# Patient Record
Sex: Female | Born: 1950 | Race: White | Hispanic: No | Marital: Single | Smoking: Former smoker
Health system: Southern US, Community
[De-identification: ages and names within clinical notes are randomized; demographics above are authoritative.]

## PROBLEM LIST (undated history)

## (undated) DIAGNOSIS — E538 Deficiency of other specified B group vitamins: Secondary | ICD-10-CM

## (undated) DIAGNOSIS — K760 Fatty (change of) liver, not elsewhere classified: Secondary | ICD-10-CM

## (undated) DIAGNOSIS — N289 Disorder of kidney and ureter, unspecified: Secondary | ICD-10-CM

## (undated) DIAGNOSIS — K0889 Other specified disorders of teeth and supporting structures: Secondary | ICD-10-CM

## (undated) DIAGNOSIS — C8199 Hodgkin lymphoma, unspecified, extranodal and solid organ sites: Secondary | ICD-10-CM

## (undated) DIAGNOSIS — M2550 Pain in unspecified joint: Secondary | ICD-10-CM

## (undated) DIAGNOSIS — M797 Fibromyalgia: Secondary | ICD-10-CM

## (undated) DIAGNOSIS — M549 Dorsalgia, unspecified: Secondary | ICD-10-CM

## (undated) DIAGNOSIS — M109 Gout, unspecified: Secondary | ICD-10-CM

## (undated) DIAGNOSIS — M1990 Unspecified osteoarthritis, unspecified site: Secondary | ICD-10-CM

## (undated) DIAGNOSIS — E8881 Metabolic syndrome: Secondary | ICD-10-CM

## (undated) DIAGNOSIS — E039 Hypothyroidism, unspecified: Secondary | ICD-10-CM

## (undated) DIAGNOSIS — J439 Emphysema, unspecified: Secondary | ICD-10-CM

## (undated) DIAGNOSIS — Z87898 Personal history of other specified conditions: Secondary | ICD-10-CM

## (undated) DIAGNOSIS — F32A Depression, unspecified: Secondary | ICD-10-CM

## (undated) DIAGNOSIS — G4733 Obstructive sleep apnea (adult) (pediatric): Secondary | ICD-10-CM

## (undated) DIAGNOSIS — K589 Irritable bowel syndrome without diarrhea: Secondary | ICD-10-CM

## (undated) DIAGNOSIS — J45909 Unspecified asthma, uncomplicated: Secondary | ICD-10-CM

## (undated) DIAGNOSIS — R1310 Dysphagia, unspecified: Secondary | ICD-10-CM

## (undated) DIAGNOSIS — R0602 Shortness of breath: Secondary | ICD-10-CM

## (undated) DIAGNOSIS — F1990 Other psychoactive substance use, unspecified, uncomplicated: Secondary | ICD-10-CM

## (undated) DIAGNOSIS — K829 Disease of gallbladder, unspecified: Secondary | ICD-10-CM

## (undated) DIAGNOSIS — K219 Gastro-esophageal reflux disease without esophagitis: Secondary | ICD-10-CM

## (undated) DIAGNOSIS — I872 Venous insufficiency (chronic) (peripheral): Secondary | ICD-10-CM

## (undated) DIAGNOSIS — R079 Chest pain, unspecified: Secondary | ICD-10-CM

## (undated) DIAGNOSIS — M7989 Other specified soft tissue disorders: Secondary | ICD-10-CM

## (undated) DIAGNOSIS — N979 Female infertility, unspecified: Secondary | ICD-10-CM

## (undated) DIAGNOSIS — J449 Chronic obstructive pulmonary disease, unspecified: Secondary | ICD-10-CM

## (undated) DIAGNOSIS — F419 Anxiety disorder, unspecified: Secondary | ICD-10-CM

## (undated) DIAGNOSIS — F341 Dysthymic disorder: Secondary | ICD-10-CM

## (undated) HISTORY — DX: Unspecified osteoarthritis, unspecified site: M19.90

## (undated) HISTORY — DX: Other specified disorders of teeth and supporting structures: K08.89

## (undated) HISTORY — DX: Female infertility, unspecified: N97.9

## (undated) HISTORY — DX: Disorder of kidney and ureter, unspecified: N28.9

## (undated) HISTORY — DX: Anxiety disorder, unspecified: F41.9

## (undated) HISTORY — PX: APPENDECTOMY: SHX54

## (undated) HISTORY — DX: Deficiency of other specified B group vitamins: E53.8

## (undated) HISTORY — DX: Chronic obstructive pulmonary disease, unspecified: J44.9

## (undated) HISTORY — PX: SPLENECTOMY: SUR1306

## (undated) HISTORY — DX: Gout, unspecified: M10.9

## (undated) HISTORY — DX: Venous insufficiency (chronic) (peripheral): I87.2

## (undated) HISTORY — DX: Metabolic syndrome: E88.810

## (undated) HISTORY — DX: Chest pain, unspecified: R07.9

## (undated) HISTORY — DX: Disease of gallbladder, unspecified: K82.9

## (undated) HISTORY — DX: Fatty (change of) liver, not elsewhere classified: K76.0

## (undated) HISTORY — DX: Hypothyroidism, unspecified: E03.9

## (undated) HISTORY — DX: Other specified soft tissue disorders: M79.89

## (undated) HISTORY — DX: Personal history of other specified conditions: Z87.898

## (undated) HISTORY — DX: Obstructive sleep apnea (adult) (pediatric): G47.33

## (undated) HISTORY — DX: Emphysema, unspecified: J43.9

## (undated) HISTORY — DX: Dorsalgia, unspecified: M54.9

## (undated) HISTORY — DX: Shortness of breath: R06.02

## (undated) HISTORY — DX: Dysthymic disorder: F34.1

## (undated) HISTORY — DX: Pain in unspecified joint: M25.50

## (undated) HISTORY — DX: Irritable bowel syndrome, unspecified: K58.9

## (undated) HISTORY — DX: Metabolic syndrome: E88.81

## (undated) HISTORY — DX: Gastro-esophageal reflux disease without esophagitis: K21.9

## (undated) HISTORY — DX: Morbid (severe) obesity due to excess calories: E66.01

## (undated) HISTORY — DX: Unspecified asthma, uncomplicated: J45.909

## (undated) HISTORY — DX: Dysphagia, unspecified: R13.10

## (undated) HISTORY — DX: Depression, unspecified: F32.A

## (undated) HISTORY — DX: Fibromyalgia: M79.7

## (undated) HISTORY — DX: Other psychoactive substance use, unspecified, uncomplicated: F19.90

---

## 1980-03-02 HISTORY — PX: CHOLECYSTECTOMY: SHX55

## 2003-04-17 ENCOUNTER — Encounter (HOSPITAL_COMMUNITY): Admission: RE | Admit: 2003-04-17 | Discharge: 2003-07-16 | Payer: Self-pay | Admitting: Pulmonary Disease

## 2003-05-29 ENCOUNTER — Ambulatory Visit (HOSPITAL_BASED_OUTPATIENT_CLINIC_OR_DEPARTMENT_OTHER): Admission: RE | Admit: 2003-05-29 | Discharge: 2003-05-29 | Payer: Self-pay | Admitting: Pulmonary Disease

## 2003-07-27 ENCOUNTER — Ambulatory Visit (HOSPITAL_COMMUNITY): Admission: RE | Admit: 2003-07-27 | Discharge: 2003-07-27 | Payer: Self-pay | Admitting: Pulmonary Disease

## 2003-08-03 ENCOUNTER — Encounter: Payer: Self-pay | Admitting: Pulmonary Disease

## 2004-02-20 ENCOUNTER — Ambulatory Visit: Payer: Self-pay | Admitting: Pulmonary Disease

## 2004-04-21 ENCOUNTER — Ambulatory Visit: Payer: Self-pay | Admitting: Pulmonary Disease

## 2004-11-09 ENCOUNTER — Encounter: Admission: RE | Admit: 2004-11-09 | Discharge: 2004-11-09 | Payer: Self-pay | Admitting: Rheumatology

## 2004-12-03 ENCOUNTER — Ambulatory Visit: Payer: Self-pay | Admitting: Pulmonary Disease

## 2005-01-02 ENCOUNTER — Ambulatory Visit: Payer: Self-pay | Admitting: Internal Medicine

## 2005-02-20 ENCOUNTER — Ambulatory Visit: Payer: Self-pay | Admitting: Internal Medicine

## 2005-03-02 HISTORY — PX: BILATERAL SALPINGOOPHORECTOMY: SHX1223

## 2005-03-02 HISTORY — PX: LAPAROSCOPIC HYSTERECTOMY: SHX1926

## 2005-03-26 ENCOUNTER — Encounter: Admission: RE | Admit: 2005-03-26 | Discharge: 2005-06-24 | Payer: Self-pay | Admitting: Pulmonary Disease

## 2005-04-01 ENCOUNTER — Ambulatory Visit: Payer: Self-pay | Admitting: Pulmonary Disease

## 2005-04-09 ENCOUNTER — Ambulatory Visit: Payer: Self-pay | Admitting: Pulmonary Disease

## 2005-05-14 ENCOUNTER — Ambulatory Visit: Payer: Self-pay | Admitting: Pulmonary Disease

## 2005-05-25 ENCOUNTER — Ambulatory Visit: Payer: Self-pay | Admitting: Pulmonary Disease

## 2005-06-23 ENCOUNTER — Emergency Department (HOSPITAL_COMMUNITY): Admission: EM | Admit: 2005-06-23 | Discharge: 2005-06-23 | Payer: Self-pay | Admitting: Emergency Medicine

## 2005-06-26 ENCOUNTER — Ambulatory Visit: Payer: Self-pay | Admitting: Pulmonary Disease

## 2005-07-06 ENCOUNTER — Encounter: Payer: Self-pay | Admitting: Pulmonary Disease

## 2005-07-16 ENCOUNTER — Ambulatory Visit (HOSPITAL_COMMUNITY): Admission: RE | Admit: 2005-07-16 | Discharge: 2005-07-16 | Payer: Self-pay | Admitting: Cardiovascular Disease

## 2005-07-22 ENCOUNTER — Ambulatory Visit: Payer: Self-pay | Admitting: Pulmonary Disease

## 2005-07-22 ENCOUNTER — Inpatient Hospital Stay (HOSPITAL_COMMUNITY): Admission: AD | Admit: 2005-07-22 | Discharge: 2005-07-23 | Payer: Self-pay | Admitting: Psychiatry

## 2005-07-22 ENCOUNTER — Emergency Department (HOSPITAL_COMMUNITY): Admission: EM | Admit: 2005-07-22 | Discharge: 2005-07-22 | Payer: Self-pay | Admitting: Emergency Medicine

## 2005-07-23 ENCOUNTER — Ambulatory Visit: Payer: Self-pay | Admitting: Psychiatry

## 2005-07-31 ENCOUNTER — Ambulatory Visit (HOSPITAL_COMMUNITY): Payer: Self-pay | Admitting: Psychiatry

## 2005-08-19 ENCOUNTER — Ambulatory Visit: Payer: Self-pay | Admitting: Pulmonary Disease

## 2005-09-07 ENCOUNTER — Ambulatory Visit: Payer: Self-pay | Admitting: Pulmonary Disease

## 2005-09-09 ENCOUNTER — Ambulatory Visit (HOSPITAL_COMMUNITY): Payer: Self-pay | Admitting: Psychiatry

## 2005-09-17 ENCOUNTER — Ambulatory Visit: Payer: Self-pay | Admitting: Pulmonary Disease

## 2005-09-28 ENCOUNTER — Ambulatory Visit: Payer: Self-pay | Admitting: Pulmonary Disease

## 2005-10-07 ENCOUNTER — Ambulatory Visit: Payer: Self-pay | Admitting: Pulmonary Disease

## 2005-10-27 ENCOUNTER — Ambulatory Visit: Payer: Self-pay | Admitting: Pulmonary Disease

## 2005-11-16 ENCOUNTER — Ambulatory Visit: Payer: Self-pay | Admitting: Pulmonary Disease

## 2005-11-25 ENCOUNTER — Ambulatory Visit: Payer: Self-pay | Admitting: Pulmonary Disease

## 2005-11-30 ENCOUNTER — Ambulatory Visit: Payer: Self-pay | Admitting: Pulmonary Disease

## 2006-02-15 ENCOUNTER — Ambulatory Visit: Payer: Self-pay | Admitting: Pulmonary Disease

## 2006-03-08 ENCOUNTER — Ambulatory Visit: Payer: Self-pay | Admitting: Pulmonary Disease

## 2006-04-05 ENCOUNTER — Ambulatory Visit: Payer: Self-pay | Admitting: Pulmonary Disease

## 2006-04-19 ENCOUNTER — Ambulatory Visit: Payer: Self-pay | Admitting: Pulmonary Disease

## 2006-05-31 ENCOUNTER — Ambulatory Visit: Payer: Self-pay | Admitting: Pulmonary Disease

## 2006-07-09 ENCOUNTER — Ambulatory Visit: Payer: Self-pay | Admitting: Pulmonary Disease

## 2006-07-09 LAB — CONVERTED CEMR LAB
AST: 25 units/L (ref 0–37)
Albumin: 3.5 g/dL (ref 3.5–5.2)
Basophils Absolute: 0 10*3/uL (ref 0.0–0.1)
Basophils Relative: 0 % (ref 0.0–1.0)
Bilirubin, Direct: 0.2 mg/dL (ref 0.0–0.3)
CO2: 31 meq/L (ref 19–32)
GFR calc Af Amer: 66 mL/min
GFR calc non Af Amer: 54 mL/min
Glucose, Bld: 93 mg/dL (ref 70–99)
Hgb A1c MFr Bld: 7.1 % — ABNORMAL HIGH (ref 4.6–6.0)
MCHC: 33.4 g/dL (ref 30.0–36.0)
MCV: 92.4 fL (ref 78.0–100.0)
Monocytes Absolute: 0.7 10*3/uL (ref 0.2–0.7)
Potassium: 4.6 meq/L (ref 3.5–5.1)
RBC: 5.11 M/uL (ref 3.87–5.11)
RDW: 14.6 % (ref 11.5–14.6)
Sodium: 143 meq/L (ref 135–145)
Total Bilirubin: 0.8 mg/dL (ref 0.3–1.2)
Total Protein: 6.8 g/dL (ref 6.0–8.3)
WBC: 12.1 10*3/uL — ABNORMAL HIGH (ref 4.5–10.5)

## 2006-08-02 ENCOUNTER — Ambulatory Visit: Payer: Self-pay | Admitting: Pulmonary Disease

## 2006-10-06 ENCOUNTER — Ambulatory Visit: Payer: Self-pay | Admitting: Pulmonary Disease

## 2006-10-11 ENCOUNTER — Ambulatory Visit: Payer: Self-pay | Admitting: Pulmonary Disease

## 2006-11-15 ENCOUNTER — Ambulatory Visit: Payer: Self-pay | Admitting: Pulmonary Disease

## 2006-11-15 LAB — CONVERTED CEMR LAB
ALT: 34 units/L (ref 0–35)
AST: 34 units/L (ref 0–37)
Albumin: 3.5 g/dL (ref 3.5–5.2)
Alkaline Phosphatase: 133 units/L — ABNORMAL HIGH (ref 39–117)
BUN: 21 mg/dL (ref 6–23)
Basophils Relative: 0 % (ref 0.0–1.0)
Chloride: 109 meq/L (ref 96–112)
Cholesterol: 175 mg/dL (ref 0–200)
Eosinophils Absolute: 0.2 10*3/uL (ref 0.0–0.6)
Free T4: 0.9 ng/dL (ref 0.6–1.6)
GFR calc non Af Amer: 61 mL/min
LDL Cholesterol: 125 mg/dL — ABNORMAL HIGH (ref 0–99)
Lymphocytes Relative: 18.8 % (ref 12.0–46.0)
MCHC: 33.7 g/dL (ref 30.0–36.0)
MCV: 93.6 fL (ref 78.0–100.0)
Platelets: 367 10*3/uL (ref 150–400)
Potassium: 4.3 meq/L (ref 3.5–5.1)
Sodium: 147 meq/L — ABNORMAL HIGH (ref 135–145)
TSH: 0.56 microintl units/mL (ref 0.35–5.50)
Total Bilirubin: 0.8 mg/dL (ref 0.3–1.2)
Total Protein: 6.8 g/dL (ref 6.0–8.3)
Triglycerides: 133 mg/dL (ref 0–149)

## 2006-12-13 ENCOUNTER — Encounter: Admission: RE | Admit: 2006-12-13 | Discharge: 2006-12-13 | Payer: Self-pay | Admitting: Surgery

## 2006-12-13 ENCOUNTER — Ambulatory Visit: Payer: Self-pay | Admitting: Pulmonary Disease

## 2006-12-20 ENCOUNTER — Ambulatory Visit (HOSPITAL_COMMUNITY): Admission: RE | Admit: 2006-12-20 | Discharge: 2006-12-20 | Payer: Self-pay | Admitting: Surgery

## 2007-01-19 ENCOUNTER — Telehealth: Payer: Self-pay | Admitting: Pulmonary Disease

## 2007-02-09 ENCOUNTER — Telehealth: Payer: Self-pay | Admitting: Pulmonary Disease

## 2007-02-28 DIAGNOSIS — J449 Chronic obstructive pulmonary disease, unspecified: Secondary | ICD-10-CM | POA: Insufficient documentation

## 2007-02-28 DIAGNOSIS — G4733 Obstructive sleep apnea (adult) (pediatric): Secondary | ICD-10-CM

## 2007-02-28 DIAGNOSIS — K589 Irritable bowel syndrome without diarrhea: Secondary | ICD-10-CM | POA: Insufficient documentation

## 2007-02-28 DIAGNOSIS — K219 Gastro-esophageal reflux disease without esophagitis: Secondary | ICD-10-CM | POA: Insufficient documentation

## 2007-03-09 ENCOUNTER — Telehealth (INDEPENDENT_AMBULATORY_CARE_PROVIDER_SITE_OTHER): Payer: Self-pay | Admitting: *Deleted

## 2007-05-31 ENCOUNTER — Ambulatory Visit: Payer: Self-pay | Admitting: Pulmonary Disease

## 2007-05-31 LAB — CONVERTED CEMR LAB: A-1 Antitrypsin, Ser: 218 mg/dL — ABNORMAL HIGH (ref 83–200)

## 2007-06-08 LAB — CONVERTED CEMR LAB
BUN: 17 mg/dL (ref 6–23)
CO2: 35 meq/L — ABNORMAL HIGH (ref 19–32)
Creatinine, Ser: 1.5 mg/dL — ABNORMAL HIGH (ref 0.4–1.2)
GFR calc Af Amer: 46 mL/min
GFR calc non Af Amer: 38 mL/min
Glucose, Bld: 106 mg/dL — ABNORMAL HIGH (ref 70–99)

## 2007-06-15 ENCOUNTER — Ambulatory Visit: Payer: Self-pay | Admitting: Pulmonary Disease

## 2007-06-15 ENCOUNTER — Encounter: Payer: Self-pay | Admitting: Adult Health

## 2007-06-15 DIAGNOSIS — R079 Chest pain, unspecified: Secondary | ICD-10-CM

## 2007-06-16 ENCOUNTER — Ambulatory Visit: Payer: Self-pay | Admitting: Cardiology

## 2007-06-20 ENCOUNTER — Ambulatory Visit: Payer: Self-pay | Admitting: Cardiology

## 2007-06-20 ENCOUNTER — Telehealth: Payer: Self-pay | Admitting: Pulmonary Disease

## 2007-06-21 ENCOUNTER — Encounter: Payer: Self-pay | Admitting: Pulmonary Disease

## 2007-06-21 ENCOUNTER — Ambulatory Visit: Payer: Self-pay

## 2007-06-22 ENCOUNTER — Ambulatory Visit: Payer: Self-pay

## 2007-06-24 ENCOUNTER — Ambulatory Visit: Payer: Self-pay | Admitting: Pulmonary Disease

## 2007-06-24 DIAGNOSIS — I872 Venous insufficiency (chronic) (peripheral): Secondary | ICD-10-CM

## 2007-06-24 DIAGNOSIS — E039 Hypothyroidism, unspecified: Secondary | ICD-10-CM

## 2007-06-24 DIAGNOSIS — E8881 Metabolic syndrome: Secondary | ICD-10-CM

## 2007-06-24 DIAGNOSIS — R51 Headache: Secondary | ICD-10-CM | POA: Insufficient documentation

## 2007-06-24 DIAGNOSIS — F341 Dysthymic disorder: Secondary | ICD-10-CM

## 2007-06-24 DIAGNOSIS — R519 Headache, unspecified: Secondary | ICD-10-CM | POA: Insufficient documentation

## 2007-06-24 DIAGNOSIS — M1990 Unspecified osteoarthritis, unspecified site: Secondary | ICD-10-CM

## 2007-06-24 DIAGNOSIS — Z8542 Personal history of malignant neoplasm of other parts of uterus: Secondary | ICD-10-CM | POA: Insufficient documentation

## 2007-07-07 ENCOUNTER — Encounter: Payer: Self-pay | Admitting: Pulmonary Disease

## 2007-08-02 ENCOUNTER — Telehealth (INDEPENDENT_AMBULATORY_CARE_PROVIDER_SITE_OTHER): Payer: Self-pay | Admitting: *Deleted

## 2007-08-02 ENCOUNTER — Ambulatory Visit: Payer: Self-pay | Admitting: Internal Medicine

## 2007-08-10 ENCOUNTER — Telehealth (INDEPENDENT_AMBULATORY_CARE_PROVIDER_SITE_OTHER): Payer: Self-pay | Admitting: *Deleted

## 2007-08-23 ENCOUNTER — Encounter: Admission: RE | Admit: 2007-08-23 | Discharge: 2007-11-21 | Payer: Self-pay | Admitting: Surgery

## 2007-08-31 HISTORY — PX: LAPAROSCOPIC GASTRIC BANDING: SHX1100

## 2007-09-06 ENCOUNTER — Inpatient Hospital Stay (HOSPITAL_COMMUNITY): Admission: AD | Admit: 2007-09-06 | Discharge: 2007-09-08 | Payer: Self-pay | Admitting: Surgery

## 2007-09-21 ENCOUNTER — Encounter: Payer: Self-pay | Admitting: Pulmonary Disease

## 2007-09-28 ENCOUNTER — Encounter: Payer: Self-pay | Admitting: Pulmonary Disease

## 2007-10-06 ENCOUNTER — Ambulatory Visit: Payer: Self-pay | Admitting: Pulmonary Disease

## 2007-10-06 ENCOUNTER — Telehealth (INDEPENDENT_AMBULATORY_CARE_PROVIDER_SITE_OTHER): Payer: Self-pay | Admitting: *Deleted

## 2007-10-14 LAB — CONVERTED CEMR LAB
Basophils Absolute: 0.2 10*3/uL — ABNORMAL HIGH (ref 0.0–0.1)
Basophils Relative: 2 % (ref 0.0–3.0)
CO2: 30 meq/L (ref 19–32)
Chloride: 102 meq/L (ref 96–112)
Eosinophils Relative: 2.5 % (ref 0.0–5.0)
GFR calc non Af Amer: 49 mL/min
Glucose, Bld: 96 mg/dL (ref 70–99)
HCT: 55.9 % — ABNORMAL HIGH (ref 36.0–46.0)
MCHC: 32.3 g/dL (ref 30.0–36.0)
Monocytes Absolute: 0.8 10*3/uL (ref 0.1–1.0)
Potassium: 4.5 meq/L (ref 3.5–5.1)
Pro B Natriuretic peptide (BNP): 116 pg/mL — ABNORMAL HIGH (ref 0.0–100.0)
Sodium: 141 meq/L (ref 135–145)

## 2007-11-01 ENCOUNTER — Encounter (INDEPENDENT_AMBULATORY_CARE_PROVIDER_SITE_OTHER): Payer: Self-pay | Admitting: *Deleted

## 2007-11-09 ENCOUNTER — Ambulatory Visit: Payer: Self-pay | Admitting: Internal Medicine

## 2007-12-19 ENCOUNTER — Encounter: Payer: Self-pay | Admitting: Pulmonary Disease

## 2007-12-26 ENCOUNTER — Ambulatory Visit: Payer: Self-pay | Admitting: Pulmonary Disease

## 2008-02-15 ENCOUNTER — Ambulatory Visit: Payer: Self-pay | Admitting: Pulmonary Disease

## 2008-02-22 ENCOUNTER — Telehealth: Payer: Self-pay | Admitting: Pulmonary Disease

## 2008-02-23 ENCOUNTER — Ambulatory Visit: Payer: Self-pay | Admitting: Pulmonary Disease

## 2008-03-08 ENCOUNTER — Ambulatory Visit: Payer: Self-pay | Admitting: Pulmonary Disease

## 2008-04-03 ENCOUNTER — Telehealth (INDEPENDENT_AMBULATORY_CARE_PROVIDER_SITE_OTHER): Payer: Self-pay | Admitting: *Deleted

## 2008-04-17 ENCOUNTER — Telehealth (INDEPENDENT_AMBULATORY_CARE_PROVIDER_SITE_OTHER): Payer: Self-pay | Admitting: *Deleted

## 2008-07-24 ENCOUNTER — Ambulatory Visit: Payer: Self-pay | Admitting: Pulmonary Disease

## 2008-07-29 LAB — CONVERTED CEMR LAB
ALT: 21 units/L (ref 0–35)
AST: 33 units/L (ref 0–37)
Albumin: 3.7 g/dL (ref 3.5–5.2)
Basophils Absolute: 0 10*3/uL (ref 0.0–0.1)
Bilirubin, Direct: 0.3 mg/dL (ref 0.0–0.3)
CO2: 29 meq/L (ref 19–32)
Chloride: 108 meq/L (ref 96–112)
Creatinine, Ser: 1.1 mg/dL (ref 0.4–1.2)
GFR calc non Af Amer: 53.88 mL/min (ref >60)
Hemoglobin: 16.7 g/dL — ABNORMAL HIGH (ref 12.0–15.0)
Lymphs Abs: 2.1 10*3/uL (ref 0.7–4.0)
MCV: 93.2 fL (ref 78.0–100.0)
Neutrophils Relative %: 75.4 % (ref 43.0–77.0)
Potassium: 3.2 meq/L — ABNORMAL LOW (ref 3.5–5.1)
RBC: 5.14 M/uL — ABNORMAL HIGH (ref 3.87–5.11)
Total Bilirubin: 0.8 mg/dL (ref 0.3–1.2)
Vit D, 25-Hydroxy: 16 ng/mL — ABNORMAL LOW (ref 30–89)

## 2008-09-12 ENCOUNTER — Ambulatory Visit: Payer: Self-pay | Admitting: Pulmonary Disease

## 2008-09-14 LAB — CONVERTED CEMR LAB
BUN: 21 mg/dL (ref 6–23)
Calcium: 9.3 mg/dL (ref 8.4–10.5)
Glucose, Bld: 112 mg/dL — ABNORMAL HIGH (ref 70–99)
Potassium: 3.9 meq/L (ref 3.5–5.1)
Sodium: 145 meq/L (ref 135–145)

## 2008-09-17 ENCOUNTER — Telehealth: Payer: Self-pay | Admitting: Adult Health

## 2008-09-21 ENCOUNTER — Telehealth (INDEPENDENT_AMBULATORY_CARE_PROVIDER_SITE_OTHER): Payer: Self-pay | Admitting: *Deleted

## 2008-10-10 ENCOUNTER — Ambulatory Visit: Payer: Self-pay | Admitting: Adult Health

## 2008-11-09 ENCOUNTER — Telehealth (INDEPENDENT_AMBULATORY_CARE_PROVIDER_SITE_OTHER): Payer: Self-pay | Admitting: *Deleted

## 2008-12-06 ENCOUNTER — Telehealth (INDEPENDENT_AMBULATORY_CARE_PROVIDER_SITE_OTHER): Payer: Self-pay | Admitting: *Deleted

## 2008-12-31 ENCOUNTER — Telehealth: Payer: Self-pay | Admitting: Internal Medicine

## 2009-01-21 ENCOUNTER — Ambulatory Visit: Payer: Self-pay | Admitting: Pulmonary Disease

## 2009-01-21 DIAGNOSIS — E559 Vitamin D deficiency, unspecified: Secondary | ICD-10-CM | POA: Insufficient documentation

## 2009-04-01 ENCOUNTER — Ambulatory Visit: Payer: Self-pay | Admitting: Pulmonary Disease

## 2009-04-10 ENCOUNTER — Telehealth (INDEPENDENT_AMBULATORY_CARE_PROVIDER_SITE_OTHER): Payer: Self-pay | Admitting: *Deleted

## 2009-04-11 ENCOUNTER — Ambulatory Visit: Payer: Self-pay | Admitting: Pulmonary Disease

## 2009-04-11 DIAGNOSIS — J45901 Unspecified asthma with (acute) exacerbation: Secondary | ICD-10-CM | POA: Insufficient documentation

## 2009-04-11 LAB — CONVERTED CEMR LAB
AST: 30 units/L (ref 0–37)
Alkaline Phosphatase: 89 units/L (ref 39–117)
BUN: 19 mg/dL (ref 6–23)
CO2: 33 meq/L — ABNORMAL HIGH (ref 19–32)
Chloride: 100 meq/L (ref 96–112)
Eosinophils Absolute: 0.3 10*3/uL (ref 0.0–0.7)
Glucose, Bld: 76 mg/dL (ref 70–99)
HCT: 46.8 % — ABNORMAL HIGH (ref 36.0–46.0)
Hemoglobin: 15.5 g/dL — ABNORMAL HIGH (ref 12.0–15.0)
MCV: 94.6 fL (ref 78.0–100.0)
Potassium: 4.5 meq/L (ref 3.5–5.1)
RBC: 4.95 M/uL (ref 3.87–5.11)
RDW: 13.2 % (ref 11.5–14.6)
TSH: 1.82 microintl units/mL (ref 0.35–5.50)

## 2009-04-30 ENCOUNTER — Ambulatory Visit: Payer: Self-pay | Admitting: Pulmonary Disease

## 2009-06-11 ENCOUNTER — Telehealth: Payer: Self-pay | Admitting: Pulmonary Disease

## 2009-07-22 ENCOUNTER — Telehealth: Payer: Self-pay | Admitting: Pulmonary Disease

## 2009-08-06 ENCOUNTER — Encounter: Payer: Self-pay | Admitting: Pulmonary Disease

## 2009-08-14 ENCOUNTER — Encounter: Payer: Self-pay | Admitting: Pulmonary Disease

## 2009-08-22 ENCOUNTER — Encounter: Payer: Self-pay | Admitting: Pulmonary Disease

## 2009-08-29 ENCOUNTER — Telehealth (INDEPENDENT_AMBULATORY_CARE_PROVIDER_SITE_OTHER): Payer: Self-pay | Admitting: *Deleted

## 2009-09-04 ENCOUNTER — Telehealth: Payer: Self-pay | Admitting: Pulmonary Disease

## 2009-12-02 ENCOUNTER — Telehealth (INDEPENDENT_AMBULATORY_CARE_PROVIDER_SITE_OTHER): Payer: Self-pay | Admitting: *Deleted

## 2009-12-04 ENCOUNTER — Ambulatory Visit: Payer: Self-pay | Admitting: Pulmonary Disease

## 2009-12-06 LAB — CONVERTED CEMR LAB
BUN: 15 mg/dL (ref 6–23)
CO2: 29 meq/L (ref 19–32)
Calcium: 9.3 mg/dL (ref 8.4–10.5)
Chloride: 108 meq/L (ref 96–112)
Creatinine, Ser: 1.2 mg/dL (ref 0.4–1.2)
GFR calc non Af Amer: 50.45 mL/min (ref >60)
Glucose, Bld: 127 mg/dL — ABNORMAL HIGH (ref 70–99)
Pro B Natriuretic peptide (BNP): 51.2 pg/mL (ref 0.0–100.0)

## 2010-01-16 ENCOUNTER — Telehealth (INDEPENDENT_AMBULATORY_CARE_PROVIDER_SITE_OTHER): Payer: Self-pay | Admitting: *Deleted

## 2010-03-07 ENCOUNTER — Ambulatory Visit
Admission: RE | Admit: 2010-03-07 | Discharge: 2010-03-07 | Payer: Self-pay | Source: Home / Self Care | Attending: Pulmonary Disease | Admitting: Pulmonary Disease

## 2010-03-24 ENCOUNTER — Encounter: Payer: Self-pay | Admitting: Surgery

## 2010-04-03 NOTE — Medication Information (Signed)
Summary: [employer]  [employer]   Imported By: Sherian Rein 08/19/2009 13:44:25  _____________________________________________________________________  External Attachment:    Type:   Image     Comment:   External Document

## 2010-04-03 NOTE — Progress Notes (Signed)
Summary: SAMPLES  Phone Note Call from Patient Call back at [phone]   Caller: Patient Call For: Rebecca [name] Summary of Call: NEED SAMPLES OF ADVAIR 50/500 AND SPIRIVA Initial call taken by: Rickard Patience,  June 11, 2009 9:27 AM  Follow-up for Phone Call        Samples left up front pt informed. Zackery Barefoot CMA  June 11, 2009 9:50 AM     Prescriptions: ADVAIR DISKUS 500-50 MCG/DOSE AEPB (FLUTICASONE-SALMETEROL) one inhalation two times a day...  #2 x 0   Entered by:   Zackery Barefoot CMA   Authorized by:   Michele Mcalpine MD   Signed by:   Zackery Barefoot CMA on 06/11/2009   Method used:   Samples Given   RxID:   [id number] SPIRIVA HANDIHALER 18 MCG  CAPS (TIOTROPIUM BROMIDE MONOHYDRATE) once daily  #2 x 0   Entered by:   Zackery Barefoot CMA   Authorized by:   Michele Mcalpine MD   Signed by:   Zackery Barefoot CMA on 06/11/2009   Method used:   Samples Given   RxID:   [id number]

## 2010-04-03 NOTE — Medication Information (Signed)
Summary: [employer]  [employer]   Imported By: Sherian Rein 08/22/2009 11:17:44  _____________________________________________________________________  External Attachment:    Type:   Image     Comment:   External Document

## 2010-04-03 NOTE — Progress Notes (Signed)
Summary: requests to be seen today or tomorrow-sn's pt  Phone Note Call from Patient Call back at [facility] Phone [phone]   Caller: Patient Call For: nadel Summary of Call: pt c/o SOB and chest congestion x 4 days. says that she doesn't think she has a fever, but is coughing up yellow phlegm. "just really hard to breathe". pt has completed the abx that tp gave her last week.  is using her inhalers- also has continued to take musinex. pt wants to be seen.  Initial call taken by: Tivis Ringer, CNA,  April 10, 2009 11:06 AM  Follow-up for Phone Call        Marliss Czar, please advise if we can use a blocked slot or if SN would like to try another abx first. Thanks.Reynaldo Minium CMA  April 10, 2009 11:20 AM    ok to add pt on thursday at 2:30pm.  thanks [address] [facility]  April 10, 2009 2:12 PM   Additional Follow-up for Phone Call Additional follow up Details #1::        called, spoke with pt.  Pt informed SN can see her tomorrow at 2:30-she is ok with this.  appt scheduled-pt aware.   Additional Follow-up by: Gweneth Dimitri RN,  April 10, 2009 2:20 PM

## 2010-04-03 NOTE — Assessment & Plan Note (Signed)
Summary: Acute NP office visit - dyspnea   CC:  dyspnea esp w/ exertion, chest congestion x1week - denies cough, wheezing, and f/c/s.  History of Present Illness: Patient is a 60 year old white female patient of Dr. [name] who has a known history of COPD, obstructive sleep apnea, and morbid obesity.now s/p lap band on 09/05/07.    ~  4/09:  seen w/ CP, SOB, reflux symptoms... "it's a bubble" and usually relieved by belching... she had a CTChest that showed no evid of pulm emboli, + severe centilobular emphysema, mild concurrent interstitial lung dis... labs showed Creat=1.5, A1AT level was OK... her Protonix was incr to Bid, and she was referred to [facility] for [facility] eval... she has seen DrWeintruab in the past w/ Cardiolites in 2005 & 2007 showing attenuation artifacts, no ischemia, and norm EF... DrWall repeated a Myoview w/ similiar findings- no scar or ischemia & EF=55%... I have rec a GI eval w/ EGD but she states she's had complete eval from DrMartin w/ Ba Swallow, etc... she wishes to change the Protonic to Nexium as she feels this works better for her... she is now awaiting her f/u eval w/ DrMartin for LapBand surgery...   ~  7/09:  had lap-band surgery for obesity by DrMartin- this required extensive enterolysis for adhesions... subsequently she has lost weight & encouraged by the resultls... she is seeing [name], psychologist specializing in eatin 10/06/07--now down 30 # since lap band surgery. Presents for 1 week of progressively worsening DOE  Stopped lasix since surgery - took self off.   November 09, 2007--wt down almost 50# . Presents for 1 day of increased DOE and ankle edema. Has not been  wearing o2 consistent. Does not wear at night sometimes.  --increased lasix  x 3 days.   December 26, 2007 --wt down 60# . Complains 2 weeks of cough, congestion, and DOE. No edema.  . Not wearing oxygen today. Lost job recently, currently thinking about applying for disability.   February 23, 2008  --presents for increased dyspnea. not wearing O2 on consistent basis. Continues to get short of breath with  minimal activity when she does not wear O2.   --lasix and o2/cpap rec.        March 08, 2008--returns for follow up. Feeling some better, less dyspnea. Not wearing O2 today in office.  "I just dont want to wear it" .     ~  Jul 24, 2008:  she tells me she stopped her oxygen & all her meds this past winter- "I couldn't afford any of them"... her friends convinced her to go back to [place], [name] & she restarted her Celexa 40mg /d... states she can't exercise & only walking she gets is at the grocery store behind the cart... she did note marked incr edema and restarted Lasix 80mg /d and claims that she has 6-8# weight swings daily due to fluid.  September 12, 2008 --Presents today for increased SOB x3-4weeks, dry cough occasionally. Pt has a very complicated case in which she has lost her job, is on disability, no insurance or prescription coverage. She has turned her oxgen in due to cost, however she rarely wore when she came to the office despite multiple attempts to explain the importance. She has noticed over the last month, more fluid retention, increased DOE . Worse over last week, has to rest often to complete tasks. Wears out easily. She is very frustrated today.  October 10, 2008--Presents for follow up. Last visit w/ increased fluid retention,  dyspnea /DOE, weakness. She was restarted on Advair given steroid taper for COPD flare and diuretics were increased. Returns today improved, w/ improved activity tolerance, weight is down 11 lbs. Her sats were in low 90s off O2 on arrival today which is best they have been a while. She has is very excited today she has lost 100 lbs since lap band surgery. Denies chest pain, orthopnea, hemoptysis, fever, n/v/d, edema, headache. Her biggest obstable now is she has no insurance or rx coverage. We discussed looking into rx pt asistance programs and  healthserver. April 01, 2009 --Presents for acute office visit. Complains of dyspnea esp w/ exertion, chest congestion x1week. Does not have insurance , ran out of meds for while.  She no longer has her O2, however she was very resistant to using this on consistent basis. Cough is getter worse with congestion. Now wears out easily. Denies chest pain,  orthopnea, hemoptysis, fever, n/v/d, edema, headache.   Medications Prior to Update: 1)  Xopenex 1.25 Mg/13ml  Nebu (Levalbuterol Hcl) .... As Needed 2)  Advair Diskus 500-50 Mcg/dose Aepb (Fluticasone-Salmeterol) .... One Inhalation Two Times A Day... 3)  Spiriva Handihaler 18 Mcg  Caps (Tiotropium Bromide Monohydrate) .... Once Daily 4)  Proair Hfa 108 (90 Base) Mcg/act  Aers (Albuterol Sulfate) .Marland Kitchen.. 1-2 Puffs As Needed... 5)  Lasix 40 Mg Tabs (Furosemide) .... Take 1-2 Tabs By Mouth Once Daily As Needed For Swelling... 6)  Omeprazole 20 Mg Cpdr (Omeprazole) .... Take 1 Tablet By Mouth Two Times A Day 7)  Tramadol Hcl 50 Mg Tabs (Tramadol Hcl) .... Take 1 Tab By Mouth Every 6 H As Needed For Pain... 8)  Aleve 220 Mg  Tabs (Naproxen Sodium) .... 2 Tabs By Mouth Once Daily 9)  Xanax 0.5 Mg  Tabs (Alprazolam) .... 1/2 To 1 By Mouth Three Times A Day As Needed. Not To Exceed 3 Per Day. 10)  Zoloft 100 Mg Tabs (Sertraline Hcl) .... Take 1/2 Tab By Mouth Once Daily...  Current Medications (verified): 1)  Xopenex 1.25 Mg/42ml  Nebu (Levalbuterol Hcl) .... As Needed 2)  Advair Diskus 500-50 Mcg/dose Aepb (Fluticasone-Salmeterol) .... One Inhalation Two Times A Day... 3)  Spiriva Handihaler 18 Mcg  Caps (Tiotropium Bromide Monohydrate) .... Once Daily 4)  Proair Hfa 108 (90 Base) Mcg/act  Aers (Albuterol Sulfate) .Marland Kitchen.. 1-2 Puffs As Needed... 5)  Lasix 40 Mg Tabs (Furosemide) .... Take 1-2 Tabs By Mouth Once Daily As Needed For Swelling... 6)  Tramadol Hcl 50 Mg Tabs (Tramadol Hcl) .... Take 1 Tab By Mouth Every 6 H As Needed For Pain... 7)  Aleve 220 Mg   Tabs (Naproxen Sodium) .... 2 Tabs By Mouth Once Daily 8)  Xanax 0.5 Mg  Tabs (Alprazolam) .... 1/2 To 1 By Mouth Three Times A Day As Needed. Not To Exceed 3 Per Day. 9)  Zoloft 100 Mg Tabs (Sertraline Hcl) .... Take 1/2 Tab By Mouth Once Daily...  Allergies (verified): 1)  ! Penicillin 2)  ! * Mycins 3)  ! * Soy Protein  Past History:  Past Medical History: Last updated: 01/21/2009 OBSTRUCTIVE SLEEP APNEA (ICD-327.23) COPD (ICD-496) CHEST PAIN (ICD-786.50) VENOUS INSUFFICIENCY, CHRONIC (ICD-459.81) METABOLIC SYNDROME X (ICD-277.7) MORBID OBESITY (ICD-278.01) HYPOTHYROIDISM (ICD-244.9) GERD (ICD-530.81) IRRITABLE BOWEL SYNDROME (ICD-564.1) UTERINE CANCER, HX OF (ICD-V10.42) DEGENERATIVE JOINT DISEASE (ICD-715.90) ? 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)  Past Surgical History: Last updated: 01/21/2009 S/P splenectomy - age 63, due to trauma S/P appendectomy  S/P cholecystectomy - 1982 S/P laparoscopic hysterectomy & BSO- 2007 at [place] S/P lap-band placed for obesity 7/09 by DrMartin  Family History: Last updated: 10/07/2007 history of cancer in mother, but died from natural causes father died at 66 from heart failure 4 siblings 1 has heart disease 1 is borderline diabetic  Social History: Last updated: 02/15/2008 quit smoking in 1993 does not exercise 3 cups coffee a day lap-band surgery 7/09 single - one child whom she gave up for adoption at birth and re-established contact in 2009 w/ new relationship & 2 grand-daughters...  Risk Factors: Exercise: no (06/15/2007)  Risk Factors: Smoking Status: quit (02/15/2008)  Review of Systems      See HPI  Vital Signs:  Patient profile:   60 year old female Height:      66 inches Weight:      261 pounds BMI:     42.28 O2 Sat:      92 % on Room air Temp:     97.2 degrees F oral Pulse rate:   83 / minute BP sitting:   116 / 84  (left  arm) Cuff size:   regular  Vitals Entered By: Boone Master CNA (April 01, 2009 4:13 PM)  O2 Flow:  Room air CC: dyspnea esp w/ exertion, chest congestion x1week - denies cough, wheezing, f/c/s Is Patient Diabetic? No Comments Medications reviewed with patient Daytime contact number verified with patient. Boone Master CNA  April 01, 2009 4:13 PM    Physical Exam  Additional Exam:  WD, Morbidly Obese, [name] in NAD... weight 265#>260 September 17, 2008>>249 8/11 >>261 04/01/09 GENERAL:  Alert & oriented; pleasant & cooperative HEENT:  [place]/AT, EOM-wnl, PERRLA, EACs-clear, TMs-wnl, NOSE-clear, THROAT-clear & wnl. NECK:  Supple w/ fair ROM; no JVD; normal carotid impulses w/o bruits; no thyromegaly or nodules palpated; no lymphadenopathy. CHEST:  decr BS bilat, few scat rhonchi without wheezing, rales, or consolidation... HEART:  Regular Rhythm; without murmurs/ rubs/ or gallops heard... ABDOMEN:  Obese, soft & nontender; normal bowel sounds; no organomegaly or masses detected. EXT: without deformities, mod arthritic changes; no varicose veins/ +venous insuffic/trace  tr edema. NEURO:  CNs intact; no focal neuro deficits... DERM: few ecchymoses, no rash etc...     Impression & Recommendations:  Problem # 1:  COPD (ICD-496) Exacerbation  REC:  Avelox 400mg  once daily for 5 days Mucinex DM two times a day as needed cough/congestion Increase fluids.  Restart Advair and Spiriva follow up 4 weeks Dr. [name]  Please contact office for sooner follow up if symptoms do not improve or worsen   Complete Medication List: 1)  Xopenex 1.25 Mg/61ml Nebu (Levalbuterol hcl) .... As needed 2)  Advair Diskus 500-50 Mcg/dose Aepb (Fluticasone-salmeterol) .... One inhalation two times a day... 3)  Spiriva Handihaler 18 Mcg Caps (Tiotropium bromide monohydrate) .... Once daily 4)  Proair Hfa 108 (90 Base) Mcg/act Aers (Albuterol sulfate) .Marland Kitchen.. 1-2 puffs as needed... 5)  Lasix 40 Mg Tabs (Furosemide)  .... Take 1-2 tabs by mouth once daily as needed for swelling... 6)  Tramadol Hcl 50 Mg Tabs (Tramadol hcl) .... Take 1 tab by mouth every 6 h as needed for pain.Marland KitchenMarland Kitchen 7)  Aleve 220 Mg Tabs (Naproxen sodium) .... 2 tabs by mouth once daily 8)  Xanax 0.5 Mg Tabs (Alprazolam) .... 1/2 to 1 by mouth three times a day as needed. not to exceed 3 per day. 9)  Zoloft 100 Mg Tabs (Sertraline hcl) .... Take 1/2 tab by  mouth once daily...  Other Orders: Nebulizer Tx (16109) Est. Patient Level III (60454)  Patient Instructions: 1)  Avelox 400mg  once daily for 5 days 2)  Mucinex DM two times a day as needed cough/congestion 3)  Increase fluids.  4)  Restart Advair and Spiriva 5)  follow up 4 weeks Dr. [name]  6)  Please contact office for sooner follow up if symptoms do not improve or worsen    Immunization History:  Influenza Immunization History:    Influenza:  declines (04/01/2009)

## 2010-04-03 NOTE — Progress Notes (Signed)
  Phone Note Other Incoming   Request: Send information Summary of Call: Request for records received from [facility]. Request forwarded to [facility].     

## 2010-04-03 NOTE — Assessment & Plan Note (Signed)
Summary: Acute NP office visit - dyspnea   CC:  increased SOB x2weeks and fluid retention.  History of Present Illness: Patient is a 60year-old white female patient of Dr. [name] who has a known history of COPD, obstructive sleep apnea, and morbid obesity.now s/p lap band on 09/05/07.    ~  4/09:  seen w/ CP, SOB, reflux symptoms... "it's a bubble" and usually relieved by belching... she had a CTChest that showed no evid of pulm emboli, + severe centilobular emphysema, mild concurrent interstitial lung dis... labs showed Creat=1.5, A1AT level was OK... her Protonix was incr to Bid, and she was referred to [facility] for cardiac eval... she has seen DrWeintruab in the past w/ Cardiolites in 2005 & 2007 showing attenuation artifacts, no ischemia, and norm EF... DrWall repeated a Myoview w/ similiar findings- no scar or ischemia & EF=55%... I have rec a GI eval w/ EGD but she states she's had complete eval from DrMartin w/ Ba Swallow, etc... she wishes to change the Protonic to Nexium as she feels this works better for her... she is now awaiting her f/u eval w/ DrMartin for LapBand surgery...   ~  7/09:  had lap-band surgery for obesity by DrMartin- this required extensive enterolysis for adhesions... subsequently she has lost weight & encouraged by the resultls... she is seeing [name], psychologist specializing in eatin 10/06/07--now down 30 # since lap band surgery. Presents for 1 week of progressively worsening DOE  Stopped lasix since surgery - took self off.   November 09, 2007--wt down almost 50# . Presents for 1 day of increased DOE and ankle edema. Has not been  wearing o2 consistent. Does not wear at night sometimes.  --increased lasix  x 3 days.   December 26, 2007 --wt down 60# . Complains 2 weeks of cough, congestion, and DOE. No edema.  . Not wearing oxygen today. Lost job recently, currently thinking about applying for disability.   February 23, 2008 --presents for increased dyspnea. not wearing  O2 on consistent basis. Continues to get short of breath with  minimal activity when she does not wear O2.   --lasix and o2/cpap rec.   PAGE 2 >>>>>...      March 08, 2008--returns for follow up. Feeling some better, less dyspnea. Not wearing O2 today in office.  "I just dont want to wear it" .     ~  Jul 24, 2008:  she tells me she stopped her oxygen & all her meds this past winter- "I couldn't afford any of them"... her friends convinced her to go back to [place], [name] & she restarted her Celexa 40mg /d... states she can't exercise & only walking she gets is at the grocery store behind the cart... she did note marked incr edema and restarted Lasix 80mg /d and claims that she has 6-8# weight swings daily due to fluid.  September 12, 2008 --Presents today for increased SOB x3-4weeks, dry cough occasionally. Pt has a very complicated case in which she has lost her job, is on disability, no insurance or prescription coverage. She has turned her oxgen in due to cost, however she rarely wore when she came to the office despite multiple attempts to explain the importance. She has noticed over the last month, more fluid retention, increased DOE . Worse over last week, has to rest often to complete tasks. Wears out easily. She is very frustrated today.  October 10, 2008--Presents for follow up. Last visit w/ increased fluid retention, dyspnea /DOE, weakness. She  was restarted on Advair given steroid taper for COPD flare and diuretics were increased. Returns today improved, w/ improved activity tolerance, weight is down 11 lbs. Her sats were in low 90s off O2 on arrival today which is best they have been a while. She has is very excited today she has lost 100 lbs since lap band surgery. Denies chest pain, orthopnea, hemoptysis, fever, n/v/d, edema, headache. Her biggest obstable now is she has no insurance or rx coverage. We discussed looking into rx pt asistance programs and healthserver.   PAGE 3>>> April 01, 2009 --Presents for acute office visit. Complains of dyspnea esp w/ exertion, chest congestion x1week. Does not have insurance , ran out of meds for while.  She no longer has her O2, however she was very resistant to using this on consistent basis. Cough is getter worse with congestion. Now wears out easily. Denies chest pain,  orthopnea, hemoptysis, fever, n/v/d, edema, headache.    ~  April 11, 2009:  she had stopped all meds... saw TP 04/01/09 w/ COPD exac- Rx'd w/ Avelox, Mucinex, Advair, Spiriva> improved... then symptoms came roaring back x 1wk- congested, wheezing, cough, SOB, etc... she tells me that she heats w/ wood & space heaters... also using "mullin & eucalyptus" rec by a homeopath friend in [place]... hasn't been using her nebulizer, and only taking 600mg  Mucinex Bid... REC> we discussed Depo/ Pred, incr Mucinex w/ fluids, incr NEBS regularly, continue Advair500, Spiriva, etc;  check CXR (COPD, scarring, NAD); & Labs (/all=OK)...   ~  April 30, 2009:  she has mult somatic complaints and "lots of issues w/ breathing"... tired all the time & can't understand why... teels me she passeda kidney stone recently (no med attention)... chest much improved from last OV; on Pred 10mg /d now & wants to wean; seeing DrLorie (eating disorder specialist) to help her not use food & he rec applying for disability... PFT today shows severe airflow obstruction w/ FEV1= 1.51L (55%) & %1sec=59... she wants off Pred.  December 04, 2009 --Presents for an acute office visit. Complains over the last 2 weeks of increased dyspnea and fluid retention. Took extra lasix 6 days ago. Takes Lasix 80mg  most days, but took extra 80mg  that day. Legs have been more swollen. Breathing not as good, wears out easily with minimal actiivity.Denies chest pain,  orthopnea, hemoptysis, fever, n/v/d, edema, headache.   Medications Prior to Update: 1)  Xopenex 1.25 Mg/59ml  Nebu (Levalbuterol Hcl) .... Use in Nebulizer 3-4 Times  Daily.Marland KitchenMarland Kitchen 2)  Advair Diskus 500-50 Mcg/dose Aepb (Fluticasone-Salmeterol) .... One Inhalation Two Times A Day... 3)  Spiriva Handihaler 18 Mcg  Caps (Tiotropium Bromide Monohydrate) .... Once Daily 4)  Proair Hfa 108 (90 Base) Mcg/act  Aers (Albuterol Sulfate) .Marland Kitchen.. 1-2 Puffs As Needed... 5)  Mucinex Maximum Strength 1200 Mg Xr12h-Tab (Guaifenesin) .... Take 1 Tab By Mouth Two Times A Day W/ Plenty of Fluids.Marland KitchenMarland Kitchen 6)  Lasix 40 Mg Tabs (Furosemide) .... Take 1-2 Tabs By Mouth Once Daily As Needed For Swelling... 7)  Vicodin 5-500 Mg Tabs (Hydrocodone-Acetaminophen) .... Take 1 Tab By Mouth Up To 3 Times Daily As Needed For Pain... 8)  Tramadol Hcl 50 Mg Tabs (Tramadol Hcl) .... Take 1 Tab By Mouth Every 6 H As Needed For Pain... 9)  Aleve 220 Mg  Tabs (Naproxen Sodium) .... 2 Tabs By Mouth Once Daily 10)  Xanax 0.5 Mg  Tabs (Alprazolam) .... 1/2 To 1 By Mouth Three Times A Day As Needed.  Not To Exceed 3 Per Day. 11)  Zoloft 100 Mg Tabs (Sertraline Hcl) .... Take 1/2 Tab By Mouth Once Daily... 12)  Prednisone 20 Mg Tabs (Prednisone) .... Take 1/2 Tab Every Other Day Til Gone... 13)  Zithromax Z-Pak 250 Mg Tabs (Azithromycin) .... Take As Directed  Current Medications (verified): 1)  Xopenex 1.25 Mg/46ml  Nebu (Levalbuterol Hcl) .... Use in Nebulizer 3-4 Times Daily.Marland KitchenMarland Kitchen 2)  Advair Diskus 500-50 Mcg/dose Aepb (Fluticasone-Salmeterol) .... One Inhalation Two Times A Day... 3)  Spiriva Handihaler 18 Mcg  Caps (Tiotropium Bromide Monohydrate) .... Once Daily 4)  Proair Hfa 108 (90 Base) Mcg/act  Aers (Albuterol Sulfate) .Marland Kitchen.. 1-2 Puffs As Needed... 5)  Mucinex Maximum Strength 1200 Mg Xr12h-Tab (Guaifenesin) .... Take 1 Tab By Mouth Two Times A Day W/ Plenty of Fluids As Needed 6)  Lasix 40 Mg Tabs (Furosemide) .... Take 1-2 Tabs By Mouth Once Daily As Needed For Swelling... 7)  Vicodin 5-500 Mg Tabs (Hydrocodone-Acetaminophen) .... Take 1 Tab By Mouth Up To 3 Times Daily As Needed For Pain... 8)  Tramadol  Hcl 50 Mg Tabs (Tramadol Hcl) .... Take 1 Tab By Mouth Every 6 H As Needed For Pain... 9)  Aleve 220 Mg  Tabs (Naproxen Sodium) .... 2 Tabs By Mouth Once Daily 10)  Xanax 0.5 Mg  Tabs (Alprazolam) .... 1/2 To 1 By Mouth Three Times A Day As Needed. Not To Exceed 3 Per Day. 11)  Zoloft 100 Mg Tabs (Sertraline Hcl) .... Take 1/2 Tab By Mouth Once Daily...  Allergies (verified): 1)  ! Penicillin 2)  ! * Mycins 3)  ! * Soy Protein 4)  ! * Tramadol 5)  ! Vicodin  Past History:  Past Medical History: Last updated: 04/30/2009  OBSTRUCTIVE SLEEP APNEA (ICD-327.23) COPD (ICD-496) CHEST PAIN (ICD-786.50) VENOUS INSUFFICIENCY, CHRONIC (ICD-459.81) METABOLIC SYNDROME X (ICD-277.7) MORBID OBESITY (ICD-278.01) HYPOTHYROIDISM (ICD-244.9) GERD (ICD-530.81) IRRITABLE BOWEL SYNDROME (ICD-564.1) UTERINE CANCER, HX OF (ICD-V10.42) DEGENERATIVE JOINT DISEASE (ICD-715.90) ? 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)  Past Surgical History: Last updated: 04/30/2009 S/P splenectomy - age 23, due to trauma S/P appendectomy S/P cholecystectomy - 1982 S/P laparoscopic hysterectomy & BSO- 2007 at [place] S/P lap-band placed for obesity 7/09 by DrMartin  Family History: Last updated: 10/07/2007 history of cancer in mother, but died from natural causes father died at 72 from heart failure 4 siblings 1 has heart disease 1 is borderline diabetic  Social History: Last updated: 02/15/2008 quit smoking in 1993 does not exercise 3 cups coffee a day lap-band surgery 7/09 single - one child whom she gave up for adoption at birth and re-established contact in 2009 w/ new relationship & 2 grand-daughters...  Risk Factors: Exercise: no (06/15/2007)  Risk Factors: Smoking Status: quit (02/15/2008)  Review of Systems      See HPI  Vital Signs:  Patient profile:   60 year old female Height:      66 inches Weight:      275  pounds BMI:     44.55 O2 Sat:      94 % on Room air Temp:     97.1 degrees F oral Pulse rate:   80 / minute BP sitting:   132 / 76  (right arm) Cuff size:   regular  Vitals Entered By: Boone Master CNA/MA (December 04, 2009 9:53 AM)  O2 Flow:  Room air CC: increased SOB x2weeks and fluid retention Is Patient Diabetic? No Comments Medications  reviewed with patient Daytime contact number verified with patient. Boone Master CNA/MA  December 04, 2009 9:53 AM    Physical Exam  Additional Exam:  WD, Morbidly Obese, [name] in NAD... weight 265#>260 September 17, 2008>>249 8/11 >>261 04/01/09>>275 12/04/09 GENERAL:  Alert & oriented; pleasant & cooperative HEENT:  [place]/AT, EOM-wnl, PERRLA, EACs-clear, TMs-wnl, NOSE-clear, THROAT-clear & wnl. NECK:  Supple w/ fair ROM; no JVD; normal carotid impulses w/o bruits; no thyromegaly or nodules palpated; no lymphadenopathy. CHEST:  decr BS bilat,  no wheezing or crackles  HEART:  Regular Rhythm; without murmurs/ rubs/ or gallops heard... ABDOMEN:  Obese, soft & nontender; normal bowel sounds; no organomegaly or masses detected. EXT: without deformities, mod arthritic changes; no varicose veins/ +venous insuffic/trace  tr edema. NEURO:  CNs intact; no focal neuro deficits... DERM: few ecchymoses, no rash etc...     Impression & Recommendations:  Problem # 1:  COPD (ICD-496) Exacerbation w/ volume overload  Plan:  Increase lasix 120mg  once daily x 3 days then back to 80mg  once daily  low salt diet Restart Spiriva once daily  I will call with labs results.  follow up Dr. [name] in 2-3 weeks and as needed  Please contact office for sooner follow up if symptoms do not improve or worsen   Medications Added to Medication List This Visit: 1)  Mucinex Maximum Strength 1200 Mg Xr12h-tab (Guaifenesin) .... Take 1 tab by mouth two times a day w/ plenty of fluids as needed  Complete Medication List: 1)  Xopenex 1.25 Mg/19ml Nebu (Levalbuterol hcl) ....  Use in nebulizer 3-4 times daily.Marland KitchenMarland Kitchen 2)  Advair Diskus 500-50 Mcg/dose Aepb (Fluticasone-salmeterol) .... One inhalation two times a day... 3)  Spiriva Handihaler 18 Mcg Caps (Tiotropium bromide monohydrate) .... Once daily 4)  Proair Hfa 108 (90 Base) Mcg/act Aers (Albuterol sulfate) .Marland Kitchen.. 1-2 puffs as needed... 5)  Mucinex Maximum Strength 1200 Mg Xr12h-tab (Guaifenesin) .... Take 1 tab by mouth two times a day w/ plenty of fluids as needed 6)  Lasix 40 Mg Tabs (Furosemide) .... Take 1-2 tabs by mouth once daily as needed for swelling... 7)  Vicodin 5-500 Mg Tabs (Hydrocodone-acetaminophen) .... Take 1 tab by mouth up to 3 times daily as needed for pain.Marland KitchenMarland Kitchen 8)  Tramadol Hcl 50 Mg Tabs (Tramadol hcl) .... Take 1 tab by mouth every 6 h as needed for pain.Marland KitchenMarland Kitchen 9)  Aleve 220 Mg Tabs (Naproxen sodium) .... 2 tabs by mouth once daily 10)  Xanax 0.5 Mg Tabs (Alprazolam) .... 1/2 to 1 by mouth three times a day as needed. not to exceed 3 per day. 11)  Zoloft 100 Mg Tabs (Sertraline hcl) .... Take 1/2 tab by mouth once daily...  Other Orders: TLB-BMP (Basic Metabolic Panel-BMET) (80048-METABOL) TLB-BNP (B-Natriuretic Peptide) (83880-BNPR) Est. Patient Level III (16109)  Patient Instructions: 1)  Increase lasix 120mg  once daily x 3 days then back to 80mg  once daily  2)  low salt diet 3)  Restart Spiriva once daily  4)  I will call with labs results.  5)  follow up Dr. [name] in 2-3 weeks and as needed  6)  Please contact office for sooner follow up if symptoms do not improve or worsen

## 2010-04-03 NOTE — Assessment & Plan Note (Signed)
Summary: SOB-ok per Leigh-pt appt time is 2:30 / cj   CC:  3 month ROV & add-on for dyspnea and wheezing....  History of Present Illness: [name] known to me w/ mult med problems as noted below...   ~  Apr09:  seen w/ CP, SOB, reflux symptoms... "it's a bubble" and usually relieved by belching... she had a CTChest that showed no evid of pulm emboli, + severe centilobular emphysema, mild concurrent interstitial lung dis... labs showed Creat=1.5, A1AT level was OK... her Protonix was incr to Bid, and she was referred to [facility] for cardiac eval... she has seen DrWeintruab in the past w/ Cardiolites in 2005 & 2007 showing attenuation artifacts, no ischemia, and norm EF... DrWall repeated a Myoview w/ similiar findings- no scar or ischemia & EF=55%... I have rec a GI eval w/ EGD but she states she's had complete bariatric eval from DrMMartin w/ Ba Swallow, etc... she wishes to change the Protonix to Nexium as she feels this works better for her... awaiting LapBand surgery...  ~  Jul09:  had lap-band surgery for obesity by DrMartin- this required extensive enterolysis for adhesions... subsequently she has lost weight & encouraged by the results... she is seeing [name], psychologist specializing in eating disorders, on a weekly basis to help w/ weight loss...  ~  Dec09:  she has lost  ~50+ pounds by our scales & feeling better... however she has lost her job at [employer] on unemployment & cobra coverage now, contemplating disability... she's off her O2 and checks her own oximetries- "ave ~91 and if it drops I get in the wheelchair"... c/o some cough, beige phlegm, congestion- & reminded to use her NEB Prn + Mucinex 2Bid w/ fluids... she has no edema and hasn't needed her Lasix.   ~  Jul 24, 2008:  she tells me she stopped her oxygen & all her meds this past winter- "I couldn't afford any of them"... her friends convinced her to go back to [facility], [name] & she restarted her Celexa 40mg /d... states she can't  exercise & only walking she gets is at the grocery store behind the cart... she did note marked incr edema and restarted Lasix 80mg /d and claims that she has 6-8# weight swings daily due to fluid.   ~  January 21, 2009:  6 mo f/u & she is very happy w/ her results from LapBand surgery- states she's lost >100#... our scales show plateau  ~250-260# & she hasn't been back to DrMMartin in >102yr due to the $350 charge per visit she says... as noted- she's stopped the O2 ("don't need it, I'm walking daily & doing well");  she called in 9/10 wanting to restart Pred10mg  due to dyspnea but she is off this again & improved... she wanted to change the Zoloft to Wellbutrin but couldn't afford the latter med...   ~  April 11, 2009:  she had stopped all meds... saw TP 04/01/09 w/ COPD exac- Rx'd w/ Avelox, Mucinex, Advair, Spiriva> improved... then symptoms came roaring back x 1wk- congested, wheezing, cough, SOB, etc... she tells me that she heats w/ wood & space heaters... also using "mullin & eucalyptus" rec by a homeopath friend in [place]... hasn't been using her nebulizer, and only taking 600mg  Mucinex Bid... REC> we discussed Depo/ Pred, incr Mucinex w/ fluids, incr NEBS regularly, continue Advair500, Spiriva, etc;  check CXR/ labs.    Current Problem List:  OBSTRUCTIVE SLEEP APNEA (ICD-327.23) - sleep study 2005 showed RDI=15 w/ desat  to 78%, loud snoring & +leg jerks... ... on CPAP 10, uses intermittently... prev on O2 but she let this go...  COPD (ICD-496) - severe obstructive lung disease... ex-smoker, quit 1993... supposed to be on NEBULIZER w/ XOPENEX 1.25mg  regularly (hasn't needed), ADVAIR 500Bid + SPIRIVA daily (she stopped all meds)... she had a second opinion consult at [facility] by DrAdair in 2006...  ~  A1AT level is normal 218 (83-200) 3/09...  ~  baseline CXR w/ borderline Cor, COPD, interstitial scarring/ atelectasis/ obesity...  ~  prev PFT's 3/07 showed FVC=2.74 (82%), FEV1=1.67 (62%),  %1sec=61, mid-flows=27%pred... improved from 2005.  ~  CTChest in 2007 & 4/09 showed severe centrilob emyphysema, + interstitial fibrosis, sm HH, left renal cyst... neg for PE...  ~  2010:  breathing improved w/ weight reduction after Lap-band surg...  ~  2/11: COPD exac w/ neg CXR (chr changes, NAD), Rx w/ Depo/ Pred/ Avelox/ Mucinex/ NEBS/ Advair/ Spiriva/ etc...  CHEST PAIN (ICD-786.50) - eval by DrWall in 2009.  ~  baseline EKG w/ NSR, PVC's, NSSTTWA, NAD...  ~  2DEcho 2/05 showed mild MR/ TR, norm LA/ RV, EF=50-55%...  ~  NuclearStressTest 4/09 was norm- no ischemia, EF=55%... similiar to prev studies at [facility] 2005 & 2007...  VENOUS INSUFFICIENCY, CHRONIC (ICD-459.81) - Hx chr ven insuffic w/ edema... on LASIX 40mg - 1-2 daily (she self medicates)...  METABOLIC SYNDROME X (ICD-277.7) - she is on diet alone (refused Metformin therapy)...  ~  labs 9/08 showed BS=120... FBS 5/08 was 93, HgA1c=7.1  ~  labs 5/10 showed BS= 182, A1c= 6.4  ~  labs 2/11 showed BS= 76, A1c= 6.5  MORBID OBESITY (ICD-278.01) - she sees [name] for counselling about her eating disorder...  ~  max weight  ~340# before lap band surg 7/09 by DrMartin.  ~  weight 12/09 = 291#  ~  weight 5/10 = 265#  ~  weight 11/10- = 254#  ~  weight 2/11 = 260#  HYPOTHYROIDISM (ICD-244.9) - off thyroid meds since 1/10... she was started on Synthroid and followed by DrRSmith in [place] & last seen 7/09- note reviewed... she refuses f/u by [name]- we will recheck TSH off Synthroid Rx...  ~  labs 5/10 off Synthroid since 1/10 showed TSH= 1.13  ~  labs 2/11 showed TSH= 1.82  GERD (ICD-530.81) - prev on Nexium but off all meds since 1/10... IRRITABLE BOWEL SYNDROME (ICD-564.1) - colonoscopy 1982 by DrPatterson was WNL.Marland KitchenMarland Kitchen  UTERINE CANCER, HX OF (ICD-V10.42) - s/p laparoscopic hysterectomy & BSO 7/07 by DrSkinner at [facility]...   DEGENERATIVE JOINT DISEASE (ICD-715.90) - she takes ALEVE OTC as needed & DCN changed to TRAMADOL  Prn... ? 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 she was on Wellbutrin & Vicodin when last seen in 2006... VITAMIN D DEFICIENCY (ICD-268.9) - Vit D level 5/10 = 16... rec> start OTC Vit D 2000 u daily.  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 she has seen psychiatry in the past (DrBarnett in [place]) and was Dx w/ seasonal affective disorder... prev seeing psychologist [name] for eating disorder counselling...    Allergies: 1)  ! Penicillin 2)  ! * Mycins 3)  ! * Soy Protein  Comments:  Nurse/Medical Assistant: The patient's medications and allergies were reviewed with the patient and were updated in the Medication and Allergy Lists.  Past History:  Past Medical History:  OBSTRUCTIVE SLEEP APNEA (ICD-327.23) COPD (ICD-496) CHEST PAIN (ICD-786.50) VENOUS INSUFFICIENCY, CHRONIC (ICD-459.81) METABOLIC SYNDROME X (ICD-277.7) MORBID OBESITY (ICD-278.01) HYPOTHYROIDISM (ICD-244.9) GERD (ICD-530.81) IRRITABLE BOWEL SYNDROME (ICD-564.1) UTERINE CANCER, HX OF (ICD-V10.42) DEGENERATIVE JOINT DISEASE (ICD-715.90) ? 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 history of cancer in mother, but died from natural causes father died at 60 from heart failure 4 siblings 1 has heart disease 1 is borderline diabetic  Social History: Reviewed history from 02/15/2008 and no changes required. quit smoking in 1993 does not exercise 3 cups coffee a day lap-band  surgery 7/09 single - one child whom she gave up for adoption at birth and re-established contact in 2009 w/ new relationship & 2 grand-daughters...  Review of Systems      See HPI       The patient complains of dyspnea on exertion, peripheral edema, prolonged cough, and difficulty walking.  The patient denies anorexia, fever, weight loss, weight gain, vision loss, decreased hearing, hoarseness, chest pain, syncope, headaches, hemoptysis, abdominal pain, melena, hematochezia, severe indigestion/heartburn, hematuria, incontinence, muscle weakness, suspicious skin lesions, transient blindness, depression, unusual weight change, abnormal bleeding, enlarged lymph nodes, and angioedema.    Vital Signs:  Patient profile:   60 year old female Height:      66 inches Weight:      260.13 pounds O2 Sat:      90 % on Room air Temp:     97.3 degrees F oral Pulse rate:   83 / minute BP sitting:   118 / 70  (right arm) Cuff size:   regular  Vitals Entered By: Randell Loop CMA (April 11, 2009 2:27 PM)  O2 Sat at Rest %:  90 O2 Flow:  Room air CC: 3 month ROV & add-on for dyspnea, wheezing... Comments NO CHANGES IN MEDS SINCE LAST OV ON MONDAY   Physical Exam  Additional Exam:  WD, Morbidly Obese, [name] in NAD... weight = 260# today. GENERAL:  Alert & oriented; pleasant & cooperative... HEENT:  [place]/AT, EOM-wnl, PERRLA, EACs-clear, TMs-wnl, NOSE-clear, THROAT-clear & wnl. NECK:  Supple w/ fairROM; no JVD; normal carotid impulses w/o bruits; no thyromegaly or nodules palpated; no lymphadenopathy. CHEST:  decr BS bilat; bilat rhonchi, congested, wheezing, dyspneic... HEART:  Regular Rhythm; without murmurs/ rubs/ or gallops heard... ABDOMEN:  Obese, soft & nontender; normal bowel sounds; no organomegaly or masses detected. EXT: without deformities, mod arthritic changes; no varicose veins/ +venous insuffic/trace  tr edema. NEURO:  CNs intact; no focal neuro deficits... DERM: few ecchymoses,  no rash etc...     CXR  Procedure date:  04/11/2009  Findings:      CHEST - 2 VIEW Comparison: [facility] CT 07/27/2003 and [facility] chest x-ray 06/23/2005 and [facility] chest x-ray 12/20/2006 and [facility] CT angio chest 06/16/2007.   Findings: Stable hyperinflation and generalized prominence bronchopulmonary markings of advanced COPD noted.  Stable linear scarring is seen at the lingula and the lung bases.  Lungs are otherwise clear.  Heart size remains normal.  Mediastinum, hila, pleura and slight degenerative changes dorsal spine stable.   IMPRESSION:   1.  Stable COPD. 2.  No active cardiopulmonary disease.   Read By:  Barnie Del,  M.D.       MISC. Report  Procedure date:  04/11/2009  Findings:      BMP (METABOL)   Sodium                    142 mEq/L                   135-145   Potassium                 4.5 mEq/L                   3.5-5.1   Chloride                  100 mEq/L                   96-112   Carbon Dioxide       [H]  33 mEq/L                    19-32   Glucose                   76 mg/dL                    08-65   BUN                       19 mg/dL                    7-84   Creatinine                1.0 mg/dL                   6.9-6.2   Calcium                   9.4 mg/dL                   9.5-28.4   GFR                       60.00 mL/min                >60  Hepatic/Liver Function Panel (HEPATIC)   Total Bilirubin           0.3 mg/dL                   1.3-2.4   Direct Bilirubin          0.1 mg/dL                   4.0-1.0   Alkaline Phosphatase      89 U/L                      39-117   AST                       30 U/L                      0-37   ALT                       28 U/L  0-35   Total Protein             7.3 g/dL                    3.0-8.6   Albumin                   3.8 g/dL                    5.7-8.4  CBC Platelet w/Diff (CBCD)   White Cell Count          7.2  K/uL                    4.5-10.5   Red Cell Count            4.95 Mil/uL                 3.87-5.11   Hemoglobin           [H]  15.5 g/dL                   69.6-29.5   Hematocrit           [H]  46.8 %                      36.0-46.0   MCV                       94.6 fl                     78.0-100.0   Platelet Count            241.0 K/uL                  150.0-400.0   Neutrophil %              52.7 %                      43.0-77.0   Lymphocyte %              30.6 %                      12.0-46.0   Monocyte %                11.5 %                      3.0-12.0   Eosinophils%              4.5 %                       0.0-5.0   Basophils %               0.7 %                       0.0-3.0   TSH (TSH)   FastTSH                   1.82 uIU/mL                 0.35-5.50  Hemoglobin A1C (A1C)   Hemoglobin A1C            6.5 %  Impression & Recommendations:  Problem # 1:  ASTHMA UNSPECIFIED WITH EXACERBATION (ICD-493.92) Severe exac... we discussed Rx w/ Depo120/  Pred taper/  NEBS Qid/  Advair & Spiriva daily/  Mucinex 1,200mg  Bid + fluids, etc... IF NO BETTER> go to the ER for admission.  Her updated medication list for this problem includes:    Xopenex 1.25 Mg/71ml Nebu (Levalbuterol hcl) ..... Use in nebulizer 3-4 times daily...    Advair Diskus 500-50 Mcg/dose Aepb (Fluticasone-salmeterol) ..... One inhalation two times a day...    Spiriva Handihaler 18 Mcg Caps (Tiotropium bromide monohydrate) ..... Once daily    Proair Hfa 108 (90 Base) Mcg/act Aers (Albuterol sulfate) .Marland Kitchen... 1-2 puffs as needed...    Prednisone 20 Mg Tabs (Prednisone) .Marland Kitchen... Take 1 tab by mouth two times a day x 5d, then 1 tab by mouth once daily x5d, then 1/2 tab daily til return...  Orders: T-2 View CXR (71020TC) TLB-BMP (Basic Metabolic Panel-BMET) (80048-METABOL) TLB-Hepatic/Liver Function Pnl (80076-HEPATIC) TLB-CBC Platelet - w/Differential (85025-CBCD) TLB-TSH (Thyroid Stimulating Hormone)  (84443-TSH) TLB-A1C / Hgb A1C (Glycohemoglobin) (83036-A1C) T-Vitamin D (25-Hydroxy) (81191-47829)  Problem # 2:  OBSTRUCTIVE SLEEP APNEA (ICD-327.23) She only uses the CPAP intermittently...  Problem # 3:  CHEST PAIN (ICD-786.50) Cardiac stable w/o angina etc...  Problem # 4:  VENOUS INSUFFICIENCY, CHRONIC (ICD-459.81) She knows to elim sodium, elevate legs, wear support hose...  Problem # 5:  METABOLIC SYNDROME X (ICD-277.7) Discussed diet + exercise... she declines Rx w/ Metformin... BS & A1c are OK...  Problem # 6:  MORBID OBESITY (ICD-278.01) She is encouraged to contact DrMMartin re: her LapBand...  Problem # 7:  HYPOTHYROIDISM (ICD-244.9) F/u TSH is normal off meds...  Problem # 8:  GERD (ICD-530.81) GI is reasonably stable...  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  Problem # 10:  OTHER MEDICAL PROBLEMS AS NOTED>>>  Complete Medication List: 1)  Xopenex 1.25 Mg/66ml Nebu (Levalbuterol hcl) .... Use in nebulizer 3-4 times daily.Marland KitchenMarland Kitchen 2)  Advair Diskus 500-50 Mcg/dose Aepb (Fluticasone-salmeterol) .... One inhalation two times a day... 3)  Spiriva Handihaler 18 Mcg Caps (Tiotropium bromide monohydrate) .... Once daily 4)  Proair Hfa 108 (90 Base) Mcg/act Aers (Albuterol sulfate) .Marland Kitchen.. 1-2 puffs as needed... 5)  Mucinex Maximum Strength 1200 Mg Xr12h-tab (Guaifenesin) .... Take 1 tab by mouth two times a day w/ plenty of fluids.Marland KitchenMarland Kitchen 6)  Lasix 40 Mg Tabs (Furosemide) .... Take 1-2 tabs by mouth once daily as needed for swelling... 7)  Tramadol Hcl 50 Mg Tabs (Tramadol hcl) .... Take 1 tab by mouth every 6 h as needed for pain.Marland KitchenMarland Kitchen 8)  Aleve 220 Mg Tabs (Naproxen sodium) .... 2 tabs by mouth once daily 9)  Xanax 0.5 Mg Tabs (Alprazolam) .... 1/2 to 1 by mouth three times a day as needed. not to exceed 3 per day. 10)  Zoloft 100 Mg Tabs (Sertraline hcl) .... Take 1/2 tab by mouth once daily... 11)  Prednisone 20 Mg Tabs (Prednisone)  .... Take 1 tab by mouth two times a day x 5d, then 1 tab by mouth once daily x5d, then 1/2 tab daily til return... 12)  Vicodin 5-500 Mg Tabs (Hydrocodone-acetaminophen) .... Take 1 tab by mouth up to 3 times daily as needed for pain...  Other Orders: Prescription Created Electronically [id number])  Patient Instructions: 1)  Today we updated your med list- see below.... 2)  For your breathing:  We gave you a Depo shot & wrote for a  tapering course of PREDNISONE... Be sure to use your NEBULIZER 3-4 times daily & continue the Advair + Spiriva... in addition you need 1,200mg  MUCINEX twice daily w/ lots of fluids... avoid exposure to smoke (fires), dust, etc..Marland Kitchen 3)  Today we did a CXR & blood work... please call the "phone tree" in a few days for your lab results.Marland KitchenMarland Kitchen  4)  If your breathing worsens>> go to the emergency room for admission.Marland KitchenMarland Kitchen 5)  Let's plan a follow up visit in 3 weeks. Prescriptions: VICODIN 5-500 MG TABS (HYDROCODONE-ACETAMINOPHEN) take 1 tab by mouth up to 3 times daily as needed for pain...  #100 x 1   Entered and Authorized by:   Michele Mcalpine MD   Signed by:   Michele Mcalpine MD on 04/11/2009   Method used:   Print then Give to Patient   RxID:   [id number] PREDNISONE 20 MG TABS (PREDNISONE) take 1 tab by mouth two times a day x 5d, then 1 tab by mouth once daily x5d, then 1/2 tab daily til return...  #30 x 1   Entered and Authorized by:   Michele Mcalpine MD   Signed by:   Michele Mcalpine MD on 04/11/2009   Method used:   Print then Give to Patient   RxID:   [id number] MUCINEX MAXIMUM STRENGTH 1200 MG XR12H-TAB (GUAIFENESIN) take 1 tab by mouth two times a day w/ plenty of fluids...  #60 x prn   Entered and Authorized by:   Michele Mcalpine MD   Signed by:   Michele Mcalpine MD on 04/11/2009   Method used:   Print then Give to Patient   RxID:   [id number] XOPENEX 1.25 MG/3ML  NEBU (LEVALBUTEROL HCL) use in nebulizer 3-4 times daily...  #100 x prn   Entered and Authorized  by:   Michele Mcalpine MD   Signed by:   Michele Mcalpine MD on 04/11/2009   Method used:   Print then Give to Patient   RxID:   [id number]   Appended Document: SOB-ok per Leigh-pt appt time is 2:30 / cj     Clinical Lists Changes  Orders: Added new Service order of Depo- Medrol 80mg  (J1040) - Signed Added new Service order of Depo- Medrol 40mg  (J1030) - Signed Added new Service order of Admin of Therapeutic Inj  intramuscular or subcutaneous (42595) - Signed       Medication Administration  Injection # 1:    Medication: Depo- Medrol 80mg     Diagnosis: ASTHMA UNSPECIFIED WITH EXACERBATION (ICD-493.92)    Route: IM    Site: RUOQ gluteus    Exp Date: 11-2009    Lot #: [id number]    Mfr: [email or web address]    Comments: injection given by Reynaldo Minium, CMA    Patient tolerated injection without complications  Injection # 2:    Medication: Depo- Medrol 40mg     Diagnosis: ASTHMA UNSPECIFIED WITH EXACERBATION (PIR-518.84)    Route: IM    Site: RUOQ gluteus    Exp Date: 11-2009    Lot #: [id number]    Mfr: [email or web address]    Comments: injection given by Reynaldo Minium, CMA    Patient tolerated injection without complications  Orders Added: 1)  Depo- Medrol 80mg  [J1040] 2)  Depo- Medrol 40mg  [J1030] 3)  Admin of Therapeutic Inj  intramuscular or subcutaneous [60109]

## 2010-04-03 NOTE — Assessment & Plan Note (Signed)
Summary: Acute NP office visit - DOE   CC:  sinus pressure/congestion, sneezing, PND, and increased SOB x2-3days .  History of Present Illness: Patient is a 60year-old white female patient of Dr. [name] who has a known history of COPD, obstructive sleep apnea, and morbid obesity.now s/p lap band on 09/05/07.    ~  April 11, 2009:  she had stopped all meds... saw TP 04/01/09 w/ COPD exac- Rx'd w/ Avelox, Mucinex, Advair, Spiriva> improved... then symptoms came roaring back x 1wk- congested, wheezing, cough, SOB, etc... she tells me that she heats w/ wood & space heaters... also using "mullin & eucalyptus" rec by a homeopath friend in [place]... hasn't been using her nebulizer, and only taking 600mg  Mucinex Bid... REC> we discussed Depo/ Pred, incr Mucinex w/ fluids, incr NEBS regularly, continue Advair500, Spiriva, etc;  check CXR (COPD, scarring, NAD); & Labs (/all=OK)...   ~  April 30, 2009:  she has mult somatic complaints and "lots of issues w/ breathing"... tired all the time & can't understand why... teels me she passeda kidney stone recently (no med attention)... chest much improved from last OV; on Pred 10mg /d now & wants to wean; seeing DrLorie (eating disorder specialist) to help her not use food & he rec applying for disability... PFT today shows severe airflow obstruction w/ FEV1= 1.51L (55%) & %1sec=59... she wants off Pred.  December 04, 2009 --Presents for an acute office visit. Complains over the last 2 weeks of increased dyspnea and fluid retention. Took extra lasix 6 days ago. Takes Lasix 80mg  most days, but took extra 80mg  that day. Legs have been more swollen. Breathing not as good, wears out easily with minimal actiivity.Denies chest pain,  orthopnea, hemoptysis, fever, n/v/d, edema, head  March 07, 2010 --Presents for an acute office visit. Complains of sinus pressure/congestion, sneezing, PND, increased SOB x2-3days. OTC not helping  Denies chest pain, orthopnea, hemoptysis,  fever, n/v/d, edema, headache.     Medications Prior to Update: 1)  Xopenex 1.25 Mg/66ml  Nebu (Levalbuterol Hcl) .... Use in Nebulizer 3-4 Times Daily.Marland KitchenMarland Kitchen 2)  Advair Diskus 500-50 Mcg/dose Aepb (Fluticasone-Salmeterol) .... One Inhalation Two Times A Day... 3)  Spiriva Handihaler 18 Mcg  Caps (Tiotropium Bromide Monohydrate) .... Once Daily 4)  Proair Hfa 108 (90 Base) Mcg/act  Aers (Albuterol Sulfate) .Marland Kitchen.. 1-2 Puffs As Needed... 5)  Mucinex Maximum Strength 1200 Mg Xr12h-Tab (Guaifenesin) .... Take 1 Tab By Mouth Two Times A Day W/ Plenty of Fluids As Needed 6)  Lasix 40 Mg Tabs (Furosemide) .... Take 1-2 Tabs By Mouth Once Daily As Needed For Swelling... 7)  Vicodin 5-500 Mg Tabs (Hydrocodone-Acetaminophen) .... Take 1 Tab By Mouth Up To 3 Times Daily As Needed For Pain... 8)  Tramadol Hcl 50 Mg Tabs (Tramadol Hcl) .... Take 1 Tab By Mouth Every 6 H As Needed For Pain... 9)  Aleve 220 Mg  Tabs (Naproxen Sodium) .... 2 Tabs By Mouth Once Daily 10)  Xanax 0.5 Mg  Tabs (Alprazolam) .... 1/2 To 1 By Mouth Three Times A Day As Needed. Not To Exceed 3 Per Day. 11)  Zoloft 100 Mg Tabs (Sertraline Hcl) .... Take 1/2 Tab By Mouth Once Daily...  Current Medications (verified): 1)  Xopenex 1.25 Mg/41ml  Nebu (Levalbuterol Hcl) .... Use in Nebulizer 3-4 Times Daily.Marland KitchenMarland Kitchen 2)  Advair Diskus 500-50 Mcg/dose Aepb (Fluticasone-Salmeterol) .... One Inhalation Two Times A Day... 3)  Spiriva Handihaler 18 Mcg  Caps (Tiotropium Bromide Monohydrate) .... Once Daily 4)  Proair Hfa 108 (90 Base) Mcg/act  Aers (Albuterol Sulfate) .Marland Kitchen.. 1-2 Puffs As Needed... 5)  Mucinex Maximum Strength 1200 Mg Xr12h-Tab (Guaifenesin) .... Take 1 Tab By Mouth Two Times A Day W/ Plenty of Fluids As Needed 6)  Lasix 40 Mg Tabs (Furosemide) .... Take 1-2 Tabs By Mouth Once Daily As Needed For Swelling... 7)  Vicodin 5-500 Mg Tabs (Hydrocodone-Acetaminophen) .... Take 1 Tab By Mouth Up To 3 Times Daily As Needed For Pain... 8)  Tramadol Hcl  50 Mg Tabs (Tramadol Hcl) .... Take 1 Tab By Mouth Every 6 H As Needed For Pain... 9)  Aleve 220 Mg  Tabs (Naproxen Sodium) .... 2 Tabs By Mouth Once Daily 10)  Xanax 0.5 Mg  Tabs (Alprazolam) .... 1/2 To 1 By Mouth Three Times A Day As Needed. Not To Exceed 3 Per Day. 11)  Zoloft 100 Mg Tabs (Sertraline Hcl) .... Take 1/2 Tab By Mouth Once Daily...  Allergies (verified): 1)  ! Penicillin 2)  ! * Mycins 3)  ! * Soy Protein 4)  ! * Tramadol 5)  ! Vicodin  Past History:  Past Medical History: Last updated: 04/30/2009  OBSTRUCTIVE SLEEP APNEA (ICD-327.23) COPD (ICD-496) CHEST PAIN (ICD-786.50) VENOUS INSUFFICIENCY, CHRONIC (ICD-459.81) METABOLIC SYNDROME X (ICD-277.7) MORBID OBESITY (ICD-278.01) HYPOTHYROIDISM (ICD-244.9) GERD (ICD-530.81) IRRITABLE BOWEL SYNDROME (ICD-564.1) UTERINE CANCER, HX OF (ICD-V10.42) DEGENERATIVE JOINT DISEASE (ICD-715.90) ? 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)  Past Surgical History: Last updated: 04/30/2009 S/P splenectomy - age 60, due to trauma S/P appendectomy S/P cholecystectomy - 1982 S/P laparoscopic hysterectomy & BSO- 2007 at [facility] S/P lap-band placed for obesity 7/09 by DrMartin  Family History: Last updated: 10/07/2007 history of cancer in mother, but died from natural causes father died at 62 from heart failure 4 siblings 1 has heart disease 1 is borderline diabetic  Social History: Last updated: 02/15/2008 quit smoking in 1993 does not exercise 3 cups coffee a day lap-band surgery 7/09 single - one child whom she gave up for adoption at birth and re-established contact in 2009 w/ new relationship & 2 grand-daughters...  Risk Factors: Smoking Status: quit (02/15/2008)  Review of Systems      See HPI  Vital Signs:  Patient profile:   60 year old female Height:      66 inches Weight:      282.13 pounds BMI:     45.70 O2 Sat:      93 % on  Room air Temp:     98.1 degrees F oral Pulse rate:   85 / minute BP sitting:   112 / 56  (left arm) Cuff size:   large  Vitals Entered By: Boone Master CNA/MA (March 07, 2010 4:28 PM)  O2 Flow:  Room air CC: sinus pressure/congestion, sneezing, PND, increased SOB x2-3days  Is Patient Diabetic? No Comments Medications reviewed with patient Daytime contact number verified with patient. Boone Master CNA/MA  March 07, 2010 4:29 PM    Physical Exam  Additional Exam:  WD, Morbidly Obese, [name] in NAD... weight 265#>260 September 17, 2008>>249 8/11 >>261 04/01/09>>275 12/04/09> 282 March 07, 2010 GENERAL:  Alert & oriented; pleasant & cooperative HEENT:  [place]/AT, EOM-wnl, PERRLA, EACs-clear, TMs-wnl, NOSE-clear, THROAT-clear & wnl. NECK:  Supple w/ fair ROM; no JVD; normal carotid impulses w/o bruits; no thyromegaly or nodules palpated; no lymphadenopathy. CHEST:  coarse BS w/ few faint exp wheezing  HEART:  Regular Rhythm; without murmurs/  rubs/ or gallops heard... ABDOMEN:  Obese, soft & nontender; normal bowel sounds; no organomegaly or masses detected. EXT: without deformities, mod arthritic changes; no varicose veins/ +venous insuffic/trace  tr edema.     Impression & Recommendations:  Problem # 1:  ASTHMA UNSPECIFIED WITH EXACERBATION (ICD-493.92) Doxcycline 100mg  two times a day for 7 day Mucinex DM two times a day for cough/congestion  Prednisone taper over next week.  Increase fluids and rest.  Please contact office for sooner follow up if symptoms do not improve or worsen follow up Dr. [name] in 3 months   Medications Added to Medication List This Visit: 1)  Doxycycline Hyclate 100 Mg Caps (Doxycycline hyclate) .Marland Kitchen.. 1 by mouth two times a day 2)  Prednisone 10 Mg Tabs (Prednisone) .... 4 tabs for 2 days, then 3 tabs for 2 days, 2 tabs for 2 days, then 1 tab for 2 days, then stop  Other Orders: Est. Patient Level II (14782)  Patient Instructions: 1)  Doxcycline  100mg  two times a day for 7 day 2)  Mucinex DM two times a day for cough/congestion  3)  Prednisone taper over next week.  4)  Increase fluids and rest.  5)  Please contact office for sooner follow up if symptoms do not improve or worsen 6)  follow up Dr. [name] in 3 months  Prescriptions: PREDNISONE 10 MG TABS (PREDNISONE) 4 tabs for 2 days, then 3 tabs for 2 days, 2 tabs for 2 days, then 1 tab for 2 days, then stop  #20 x 0   Entered and Authorized by:   Rubye Oaks NP   Signed by:   Rubye Oaks NP on 03/07/2010   Method used:   Electronically to        [facility] (retail)       [address] [place], [place]  [address]       Ph: [phone]       Fax: [phone]   RxID:   [id number] DOXYCYCLINE HYCLATE 100 MG CAPS (DOXYCYCLINE HYCLATE) 1 by mouth two times a day  #14 x 0   Entered and Authorized by:   Rubye Oaks NP   Signed by:   Rubye Oaks NP on 03/07/2010   Method used:   Electronically to        [facility] (retail)       [address] [place], [place]  [address]       Ph: [phone]       Fax: [phone]   RxID:   [id number]

## 2010-04-03 NOTE — Progress Notes (Signed)
Summary: bronchitis  Phone Note Call from Patient   Caller: Patient Call For: Rebecca [name] Summary of Call: pt have been exposed to smoke . she has bronchitis would like to no if she should start on prednisone. Initial call taken by: Rickard Patience,  September 04, 2009 9:12 AM  Follow-up for Phone Call        called and spoke with pt and she stated that she feels like she has bronchitis---around the fireworks on monday---around lots of smoke---she is coughing alot--cough is very deep---sometimes she feels chilled--the will get hot.  please advise.   Randell Loop [facility]  September 04, 2009 12:31 PM   Additional Follow-up for Phone Call Additional follow up Details #1::        per SN---rec to use zpak first...this has been sent to her pharmacy---pt is aware and will call for any concerns. Randell Loop CMA  September 04, 2009 5:03 PM     New/Updated Medications: ZITHROMAX Z-PAK 250 MG TABS (AZITHROMYCIN) take as directed Prescriptions: ZITHROMAX Z-PAK 250 MG TABS (AZITHROMYCIN) take as directed  #1 pak x 0   Entered by:   Randell Loop CMA   Authorized by:   Michele Mcalpine MD   Signed by:   Randell Loop CMA on 09/04/2009   Method used:   Electronically to        [facility] (retail)       [address] [place], [place]  [address]       Ph: [phone]       Fax: [phone]   RxID:   [id number]

## 2010-04-03 NOTE — Assessment & Plan Note (Signed)
Summary: 3 week follow up/la   CC:  3 week ROV....  History of Present Illness: [name] known to me w/ mult med problems as noted below...   ~  Apr09:  seen w/ CP, SOB, reflux symptoms... "it's a bubble" and usually relieved by belching... she had a CTChest that showed no evid of pulm emboli, + severe centilobular emphysema, mild concurrent interstitial lung dis... labs showed Creat=1.5, A1AT level was OK... her Protonix was incr to Bid, and she was referred to [facility] for cardiac eval... she has seen DrWeintruab in the past w/ Cardiolites in 2005 & 2007 showing attenuation artifacts, no ischemia, and norm EF... DrWall repeated a Myoview w/ similiar findings- no scar or ischemia & EF=55%... I have rec a GI eval w/ EGD but she states she's had complete bariatric eval from DrMMartin w/ Ba Swallow, etc... she wishes to change the Protonix to Nexium as she feels this works better for her... awaiting LapBand surgery...  ~  Jul09:  had lap-band surgery for obesity by DrMartin- this required extensive enterolysis for adhesions... subsequently she has lost weight & encouraged by the results... she is seeing [name], psychologist specializing in eating disorders, on a weekly basis to help w/ weight loss...  ~  Dec09:  she has lost  ~50+ pounds by our scales & feeling better... however she has lost her job at [employer] on unemployment & cobra coverage now, contemplating disability... she's off her O2 and checks her own oximetries- "ave ~91 and if it drops I get in the wheelchair"... c/o some cough, beige phlegm, congestion- & reminded to use her NEB Prn + Mucinex 2Bid w/ fluids... she has no edema and hasn't needed her Lasix.   ~  Jul 24, 2008:  she tells me she stopped her oxygen & all her meds this past winter- "I couldn't afford any of them"... her friends convinced her to go back to [facility], [name] & she restarted her Celexa 40mg /d... states she can't exercise & only walking she gets is at the grocery store  behind the cart... she did note marked incr edema and restarted Lasix 80mg /d and claims that she has 6-8# weight swings daily due to fluid.   ~  January 21, 2009:  6 mo f/u & she is very happy w/ her results from LapBand surgery- states she's lost >100#... our scales show plateau  ~250-260# & she hasn't been back to DrMMartin in >41yr due to the $350 charge per visit she says... as noted- she's stopped the O2 ("don't need it, I'm walking daily & doing well");  she called in 9/10 wanting to restart Pred10mg  due to dyspnea but she is off this again & improved... she wanted to change the Zoloft to Wellbutrin but couldn't afford the latter med...   ~  April 11, 2009:  she had stopped all meds... saw TP 04/01/09 w/ COPD exac- Rx'd w/ Avelox, Mucinex, Advair, Spiriva> improved... then symptoms came roaring back x 1wk- congested, wheezing, cough, SOB, etc... she tells me that she heats w/ wood & space heaters... also using "mullin & eucalyptus" rec by a homeopath friend in [place]... hasn't been using her nebulizer, and only taking 600mg  Mucinex Bid... REC> we discussed Depo/ Pred, incr Mucinex w/ fluids, incr NEBS regularly, continue Advair500, Spiriva, etc;  check CXR (COPD, scarring, NAD); & Labs (/all=OK)...   ~  April 30, 2009:  she has mult somatic complaints and "lots of issues w/ breathing"... tired all the time & can't  understand why... teels me she passeda kidney stone recently (no med attention)... chest much improved from last OV; on Pred 10mg /d now & wants to wean; seeing DrLorie (eating disorder specialist) to help her not use food & he rec applying for disability... PFT today shows severe airflow obstruction w/ FEV1= 1.51L (55%) & %1sec=59... she wants off Pred.    Current Problem List:  OBSTRUCTIVE SLEEP APNEA (ICD-327.23) - sleep study 2005 showed RDI=15 w/ desat to 78%, loud snoring & +leg jerks... ... on CPAP 10, uses intermittently... prev on O2 but she let this go...  COPD (ICD-496) -  severe obstructive lung disease... ex-smoker, quit 1993... supposed to be on NEBULIZER w/ XOPENEX 1.25mg  regularly (hasn't needed), ADVAIR 500Bid + SPIRIVA daily (she freq stops all meds)... she had a second opinion consult at [facility] by DrAdair in 2006...  ~  A1AT level is normal 218 (83-200) 3/09...  ~  baseline CXR w/ borderline Cor, COPD, interstitial scarring/ atelectasis/ obesity...  ~  PFTs 3/07 showed FVC=2.74 (82%), FEV1=1.67 (62%), %1sec=61, mid-flows=27%pred... improved from 2005.  ~  CTChest in 2007 & 4/09 showed severe centrilob emyphysema, + interstitial fibrosis, sm HH, left renal cyst... neg for PE...  ~  2010:  breathing improved w/ weight reduction after Lap-band surg...  ~  2/11: COPD exac w/ neg CXR (chr changes, NAD), Rx w/ Depo/ Pred/ Avelox/ Mucinex/ NEBS/ Advair/ Spiriva/ etc...  ~  PFTs 3/11 showed FVC= 2.57 (73%), FEV1= 1.51 (55%), %1sec=59, mid-flows= 21%pred.  CHEST PAIN (ICD-786.50) - eval by DrWall in 2009.  ~  baseline EKG w/ NSR, PVC's, NSSTTWA, NAD...  ~  2DEcho 2/05 showed mild MR/ TR, norm LA/ RV, EF=50-55%...  ~  NuclearStressTest 4/09 was norm- no ischemia, EF=55%... similiar to prev studies at [facility] 2005 & 2007...  VENOUS INSUFFICIENCY, CHRONIC (ICD-459.81) - Hx chr ven insuffic w/ edema... on LASIX 40mg - 1-2 daily (she self medicates)...  METABOLIC SYNDROME X (ICD-277.7) - she is on diet alone (refused Metformin therapy)...  ~  labs 9/08 showed BS=120... FBS 5/08 was 93, HgA1c=7.1  ~  labs 5/10 showed BS= 182, A1c= 6.4  ~  labs 2/11 showed BS= 76, A1c= 6.5  MORBID OBESITY (ICD-278.01) - she sees [name] for counselling about her eating disorder...  ~  max weight  ~340# before lap band surg 7/09 by DrMartin.  ~  weight 12/09 = 291#  ~  weight 5/10 = 265#  ~  weight 11/10- = 254#  ~  weight 2/11 = 260#  HYPOTHYROIDISM (ICD-244.9) - off thyroid meds since 1/10... she was started on Synthroid and followed by DrRSmith in [place] & last seen 7/09- note  reviewed... she refuses f/u by [name]- we will recheck TSH off Synthroid Rx...  ~  labs 5/10 off Synthroid since 1/10 showed TSH= 1.13  ~  labs 2/11 showed TSH= 1.82  GERD (ICD-530.81) - prev on Nexium but off all meds since 1/10... IRRITABLE BOWEL SYNDROME (ICD-564.1) - colonoscopy 1982 by DrPatterson was WNL.Marland KitchenMarland Kitchen  UTERINE CANCER, HX OF (ICD-V10.42) - s/p laparoscopic hysterectomy & BSO 7/07 by DrSkinner at [place]...   DEGENERATIVE JOINT DISEASE (ICD-715.90) - she takes ALEVE OTC as needed & DCN changed to TRAMADOL Prn... ? 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 she was on Wellbutrin & Vicodin when last seen in 2006... VITAMIN D DEFICIENCY (ICD-268.9) - Vit D level 5/10 = 16... rec> start OTC Vit D 2000 u daily.  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 she has seen psychiatry in the past (DrBarnett in [place]) and was Dx w/ seasonal affective disorder... prev seeing psychologist [name] for eating disorder counselling...   Allergies: 1)  ! Penicillin 2)  ! * Mycins 3)  ! * Soy Protein  Comments:  Nurse/Medical Assistant: The patient's medications and allergies were reviewed with the patient and were updated in the Medication and Allergy Lists.  Past History:  Past Medical History:  OBSTRUCTIVE SLEEP APNEA (ICD-327.23) COPD (ICD-496) CHEST PAIN (ICD-786.50) VENOUS INSUFFICIENCY, CHRONIC (ICD-459.81) METABOLIC SYNDROME X (ICD-277.7) MORBID OBESITY (ICD-278.01) HYPOTHYROIDISM (ICD-244.9) GERD (ICD-530.81) IRRITABLE BOWEL SYNDROME (ICD-564.1) UTERINE CANCER, HX OF (ICD-V10.42) DEGENERATIVE JOINT DISEASE (ICD-715.90) ? 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 history of cancer in mother, but died from natural causes father died at 53 from heart failure 4 siblings 1 has heart disease 1 is borderline diabetic  Social History: Reviewed history from 02/15/2008 and no changes required. quit smoking in 1993 does not exercise 3 cups coffee a day lap-band surgery 7/09 single - one child whom she gave up for adoption at birth and re-established contact in 2009 w/ new relationship & 2 grand-daughters...  Review of Systems      See HPI       The patient complains of dyspnea on exertion and difficulty walking.  The patient denies anorexia, fever, weight loss, weight gain, vision loss, decreased hearing, hoarseness, chest pain, syncope, peripheral edema, prolonged cough, headaches, hemoptysis, abdominal pain, melena, hematochezia, severe indigestion/heartburn, hematuria, incontinence, muscle weakness, suspicious skin lesions, transient blindness, depression, unusual weight change, abnormal bleeding, enlarged lymph nodes, and angioedema.    Vital Signs:  Patient profile:   59 year old female Height:      66 inches Weight:      265 pounds O2 Sat:      93 % on Room air Temp:     97.3 degrees F oral Pulse rate:   85 / minute BP sitting:   110 / 74  (right arm) Cuff size:   regular  Vitals Entered By: Randell Loop CMA (April 30, 2009 11:04 AM)  O2 Sat at Rest %:  93 O2 Flow:  Room air CC: 3 week ROV... Is Patient Diabetic? No Pain Assessment Patient in pain? no      Comments no changes in meds today   Physical Exam  Additional Exam:  WD, Morbidly Obese, [name] in NAD... GENERAL:  Alert & oriented; pleasant & cooperative... HEENT:  [place]/AT, EOM-wnl, PERRLA, EACs-clear, TMs-wnl, NOSE-clear, THROAT-clear & wnl. NECK:   Supple w/ fairROM; no JVD; normal carotid impulses w/o bruits; no thyromegaly or nodules palpated; no lymphadenopathy. CHEST:  decr BS bilat; bilat rhonchi, congested, wheezing, dyspneic... HEART:  Regular Rhythm; without murmurs/ rubs/ or gallops heard... ABDOMEN:  Obese, soft & nontender; normal bowel sounds; no organomegaly or masses detected. EXT: without deformities, mod arthritic changes; no varicose veins/ +venous insuffic/trace  tr edema. NEURO:  CNs intact; no focal neuro deficits... DERM: few ecchymoses, no rash etc...    Pulmonary Function Test Date: 04/30/2009 11:41 AM Gender: Female  Pre-Spirometry FVC    Value: 2.57 L/min   Pred: 3.52 L/min     % Pred: 73 % FEV1    Value: 1.51 L     Pred: 2.72 L     % Pred: 55 % FEV1/FVC  Value: 59 %     Pred: 78 %     % Pred: -- % FEF 25-75  Value: 0.51 L/min   Pred: 2.45 L/min     % Pred: 21 %  Comments: Mod-severe airflow obstruction, w/ sl deterioration from 2007 study...  SN  Impression & Recommendations:  Problem # 1:  COPD (ICD-496) She has COPD w/ superimposed AB... she wants off Pred & we discussed the imperative of staying on all her other meds regularly to avoid exac etc... REC> wean pred to 10mg  Qod first then slowly off if able... Her updated medication list for this problem includes:    Xopenex 1.25 Mg/22ml Nebu (Levalbuterol hcl) ..... Use in nebulizer 3-4 times daily...    Advair Diskus 500-50 Mcg/dose Aepb (Fluticasone-salmeterol) ..... One inhalation two times a day...    Spiriva Handihaler 18 Mcg Caps (Tiotropium bromide monohydrate) ..... Once daily    Proair Hfa 108 (90 Base) Mcg/act Aers (Albuterol sulfate) .Marland Kitchen... 1-2 puffs as needed...  Problem # 2:  METABOLIC SYNDROME X (ICD-277.7) Weight loss is the key... she tells mne she is seeing DrLorie (eating disorder specialist) & hopes this will be more beneficial than the expensive lap-band adjustments from [facility]...  Problem # 3:  ? of FIBROMYALGIA (ICD-729.1) This may  explain many of her somatic complaints... Her updated medication list for this problem includes:    Vicodin 5-500 Mg Tabs (Hydrocodone-acetaminophen) .Marland Kitchen... Take 1 tab by mouth up to 3 times daily as needed for pain...    Tramadol Hcl 50 Mg Tabs (Tramadol hcl) .Marland Kitchen... Take 1 tab by mouth every 6 h as needed for pain...    Aleve 220 Mg Tabs (Naproxen sodium) .Marland Kitchen... 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 prec [facility] Rx etc...  Problem # 5:  OTHER MEDICAL PROBLEMS AS NOTED>>>  Complete Medication List: 1)  Xopenex 1.25 Mg/81ml Nebu (Levalbuterol hcl) .... Use in nebulizer 3-4 times daily.Marland KitchenMarland Kitchen 2)  Advair Diskus 500-50 Mcg/dose Aepb (Fluticasone-salmeterol) .... One inhalation two times a day... 3)  Spiriva Handihaler 18 Mcg Caps (Tiotropium bromide monohydrate) .... Once daily 4)  Proair Hfa 108 (90 Base) Mcg/act Aers (Albuterol sulfate) .Marland Kitchen.. 1-2 puffs as needed... 5)  Mucinex Maximum Strength 1200 Mg Xr12h-tab (Guaifenesin) .... Take 1 tab by mouth two times a day w/ plenty of fluids.Marland KitchenMarland Kitchen 6)  Lasix 40 Mg Tabs (Furosemide) .... Take 1-2 tabs by mouth once daily as needed for swelling... 7)  Vicodin 5-500 Mg Tabs (Hydrocodone-acetaminophen) .... Take 1 tab by mouth up to 3 times daily as needed for pain.Marland KitchenMarland Kitchen 8)  Tramadol Hcl 50 Mg Tabs (Tramadol hcl) .... Take 1 tab by mouth every 6 h as needed for pain.Marland KitchenMarland Kitchen 9)  Aleve 220 Mg Tabs (Naproxen sodium) .... 2 tabs by mouth once daily 10)  Xanax 0.5 Mg Tabs (Alprazolam) .... 1/2 to 1 by mouth three times a day as needed. not to exceed 3 per day. 11)  Zoloft 100 Mg Tabs (Sertraline hcl) .... Take 1/2 tab by mouth once daily... 12)  Prednisone 20 Mg Tabs (Prednisone) .... Take 1/2 tab every other day til gone...  Other Orders: Spirometry w/Graph (94010)  Patient Instructions: 1)  Today we updated your med list- see below.... 2)  We decided to wean off the Prednisone- take 1/2 tab every  other day til gone... 3)  Continue your other meds the same & take them regularly... 4)  Today we checked your PFT- and discussed the results.Marland KitchenMarland Kitchen 5)  Call for any problems.Marland KitchenMarland Kitchen 6)  Please schedule a follow-up appointment in 6 weeks...   [employer] Spirometry  ID: [id number] Patient: Rebecca [name], Rebecca [name] DOB: 14-May-1950 Age: 60 Years Old Sex: Female Race: White Physician: Ranika Mcniel Height: 66 Weight: 265 Smoker: No Status: Unconfirmed Past Medical History:   OBSTRUCTIVE SLEEP APNEA (ICD-327.23) COPD (ICD-496) CHEST PAIN (ICD-786.50) VENOUS INSUFFICIENCY, CHRONIC (ICD-459.81) METABOLIC SYNDROME X (ICD-277.7) MORBID OBESITY (ICD-278.01) HYPOTHYROIDISM (ICD-244.9) GERD (ICD-530.81) IRRITABLE BOWEL SYNDROME (ICD-564.1) UTERINE CANCER, HX OF (ICD-V10.42) DEGENERATIVE JOINT DISEASE (ICD-715.90) ? 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

## 2010-04-03 NOTE — Medication Information (Signed)
Summary: Pt Assist forms/[employer]  Pt Assist forms/[employer]   Imported By: Sherian Rein 08/22/2009 11:22:29  _____________________________________________________________________  External Attachment:    Type:   Image     Comment:   External Document

## 2010-04-03 NOTE — Progress Notes (Signed)
Summary: samples  Phone Note Call from Patient Call back at [phone] cell   Caller: Patient Call For: [name] Reason for Call: Talk to Nurse Summary of Call: need a copy of her last visit instruction sheet printed out and call pt for her to pick up.  Also Advair and Spiriva samples. Initial call taken by: Eugene Gavia,  Jul 22, 2009 1:40 PM  Follow-up for Phone Call        spoke with pt and she is needing something that has her med list on it as well as her diagnosis because she is trying to get some financial help because her unemployment ends soon. I advised the pt that when she comes and picks up samples she needs to go to med rec to sign a release to get whatever she needs as far as paperwork.  She also requests forms for assistance with advair and spiriva. Forms palced in bag with samples. pt aware. Carron Curie CMA  Jul 22, 2009 2:11 PM       Appended Document: samples     Clinical Lists Changes  Medications: Rx of ADVAIR DISKUS 500-50 MCG/DOSE AEPB (FLUTICASONE-SALMETEROL) one inhalation two times a day...;  #2 x 0;  Signed;  Entered by: Randell Loop CMA;  Authorized by: Michele Mcalpine MD;  Method used: Print then Give to Patient Rx of ADVAIR DISKUS 500-50 MCG/DOSE AEPB (FLUTICASONE-SALMETEROL) one inhalation two times a day...;  #3 x 3;  Signed;  Entered by: Randell Loop CMA;  Authorized by: Michele Mcalpine MD;  Method used: Print then Give to Patient    Prescriptions: ADVAIR DISKUS 500-50 MCG/DOSE AEPB (FLUTICASONE-SALMETEROL) one inhalation two times a day...  #3 x 3   Entered by:   Randell Loop CMA   Authorized by:   Michele Mcalpine MD   Signed by:   Randell Loop CMA on 08/07/2009   Method used:   Print then Give to Patient   RxID:   [id number] ADVAIR DISKUS 500-50 MCG/DOSE AEPB (FLUTICASONE-SALMETEROL) one inhalation two times a day...  #2 x 0   Entered by:   Randell Loop CMA   Authorized by:   Michele Mcalpine MD   Signed by:   Randell Loop CMA on  08/07/2009   Method used:   Print then Give to Patient   RxID:   [id number]   rx have been printed out for rx assistance.  signed by SN and given one to pt with no refills to take to the pharmacy and one with 3 refills mailed with papers to [facility]. Randell Loop CMA  August 07, 2009 4:09 PM

## 2010-04-03 NOTE — Progress Notes (Signed)
Summary: xanax  Phone Note Call from Patient Call back at Home Phone [phone]   Caller: Patient Call For: nadel Reason for Call: Talk to Nurse Summary of Call: pt requested xanax.  Request was denied and pt was told she needed appt.  She has no job and no insurance right now.  Can she go ahead and get refill.  She does come in when sick. [facility] Initial call taken by: Eugene Gavia,  August 29, 2009 3:24 PM  Follow-up for Phone Call        Please advise.Michel Bickers CMA  August 29, 2009 3:26 PM   ok to give her refills of the xanax---she did see SN in march 2011.  thanks Randell Loop CMA  August 29, 2009 3:30 PM   Pt aware RX called to the pharmacy.Michel Bickers CMA  August 29, 2009 3:57 PM    Prescriptions: XANAX 0.5 MG  TABS (ALPRAZOLAM) 1/2 to 1 by mouth three times a day as needed. Not to exceed 3 per day.  #100 x 2   Entered by:   Michel Bickers CMA   Authorized by:   Michele Mcalpine MD   Signed by:   Michel Bickers CMA on 08/29/2009   Method used:   Telephoned to ...       [facility] (retail)       [address] [place], [place]  [address]       Ph: [phone]       Fax: [phone]   RxID:   [id number]

## 2010-04-17 ENCOUNTER — Telehealth (INDEPENDENT_AMBULATORY_CARE_PROVIDER_SITE_OTHER): Payer: Self-pay | Admitting: *Deleted

## 2010-04-24 ENCOUNTER — Telehealth (INDEPENDENT_AMBULATORY_CARE_PROVIDER_SITE_OTHER): Payer: Self-pay | Admitting: *Deleted

## 2010-04-24 ENCOUNTER — Telehealth: Payer: Self-pay | Admitting: Pulmonary Disease

## 2010-04-29 NOTE — Progress Notes (Signed)
Summary: patient cant breath --[id number]  Phone Note Call from Patient   Caller: Patient Call For: tammy p Summary of Call: patient phoned stated that she wanted to leave a message for Tammys nurse she cant catch her breath she is sill on 10 mg of prednisone. She stated that she has to set perfectly still to breath adequately but she tries to stand or walk through her house she cant breath. Patient can be reached at [phone]  Initial call taken by: Vedia Coffer,  April 24, 2010 10:45 AM  Follow-up for Phone Call        ATC pt at alternate # she left.  Line busy x 5.  ATC pt at home # . Line has been disconnected.  WCB... Aundra Millet Reynolds LPN  April 24, 2010 11:08 AM   ATC pt at alternate # she left.  Line busy x 3. WCB. Marland Kitchen Arman Filter LPN  April 24, 2010 11:51 AM   ATC pt at alternate # she left.  LIne STILL busy.  Reviewed pt's chart and found most recent phone message with a different number than what is listed here...Marland KitchenMarland Kitchen316 [phone] attempted to call this # and had to LM for pt TCB. Marland Kitchen Arman Filter LPN  April 24, 2010 2:16 PM   Additional Follow-up for Phone Call Additional follow up Details #1::        See 2/23 on call note.  Additional Follow-up by: Vernie Murders,  April 25, 2010 4:17 PM

## 2010-04-29 NOTE — Progress Notes (Addendum)
  Phone Note Outgoing Call   Call placed by: Coralyn Helling MD,  April 24, 2010 5:47 PM Call placed to: Patient Summary of Call: More short of breath.  Using 10 mg prednisone every other day for last few days.  Has been on prednisone for past 6 weeks.  Breathing worse with decrease in prednisone.  Not much cough or wheeze.  Feels congested.  Can't get deep breath.  No chest pain.  No sputum.  Barky type of cough.  No fever.  SInus okay, and throat okay.  No abd symptoms.  She is using xopenex two times a day, and this only helps partially.  She continues to use advair.  She can not afford spiriva or proair, and as such is not using either of these.  I have advised her to take an additional 20 mg prednisone tonight for total of 30 mg today.  She can take 20 mg prednisone on 02/24.  Will forward message to Dr. [name] so that he can follow up for further instructions. Initial call taken by: Coralyn Helling MD,  April 24, 2010 5:47 PM     Appended Document:  i have attempted to contact pt about the message that was left by VS about the pts SOB ---the number [phone]--DO NOT CALL THIS NUMBER AGAIN---the man that has this number is very rude--i am guessing that she has given this number out alot and he keeps getting calls from people asking for pt----per SN---sob/copd---stay on advair two times a day and use the xopenex every 6 hours as needed keep the pred up to 10mg  daily if necessary and spiriva would help---congested--can use the mucinex otc 2 by mouth two times a day  and for not being able to get a deep breath--chest wall muscle spasms----take the alprazolam 0.5mg  three times a day

## 2010-04-29 NOTE — Progress Notes (Signed)
Summary: prescription for prednisone  Phone Note Call from Patient   Caller: Patient Call For: NADEL Summary of Call: Patient phoned and would like to get a prescription for prednisone and she is still trying to taper off and she only has 3 tablets left right now she is on 10 mg a day everytime she tries to decrease it she has problems breathing. she uses [facility] in [place]. she can be reached at [phone] Initial call taken by: Vedia Coffer,  April 17, 2010 2:00 PM  Follow-up for Phone Call        dr nadel pls advise if you want to leave this pt on 10mg  once daily maint. dose or have her to continue to taper. she is requesting refill for the 10mg  once daily dose.   Philipp Deputy [facility]  April 17, 2010 4:12 PM   Additional Follow-up for Phone Call Additional follow up Details #1::        per SN----pred 10mg   #30   1 by mouth once daily no refills.   rov with SN in 1 month---pred has been sent to the pharmacy per pts request---the number given was not the correct number and the home number is not in working order...will try to call pt again tomorrow Randell Loop [facility]  April 17, 2010 5:29 PM     Additional Follow-up for Phone Call Additional follow up Details #2::    Called home number and this has been d/c'ed. I called the number given in the original msg and was told that this is the wrong number.  will sign msg  Follow-up by: Vernie Murders,  April 21, 2010 10:16 AM  New/Updated Medications: PREDNISONE 10 MG TABS (PREDNISONE) take one tablet by mouth once daily Prescriptions: PREDNISONE 10 MG TABS (PREDNISONE) take one tablet by mouth once daily  #30 x 0   Entered by:   Randell Loop CMA   Authorized by:   Michele Mcalpine MD   Signed by:   Vernie Murders on 04/21/2010   Method used:   Electronically to        [facility] (retail)       [address] [place], [place]  [address]       Ph: [phone]       Fax: [phone]   RxID:    [id number]

## 2010-05-22 ENCOUNTER — Telehealth: Payer: Self-pay | Admitting: Pulmonary Disease

## 2010-05-22 NOTE — Telephone Encounter (Signed)
ATC pt x 3. Line busy. WCB.  

## 2010-05-23 NOTE — Telephone Encounter (Signed)
ATC pt again at the number provided.  Line still busy

## 2010-05-26 ENCOUNTER — Other Ambulatory Visit: Payer: Self-pay | Admitting: *Deleted

## 2010-05-26 MED ORDER — SERTRALINE HCL 100 MG PO TABS
ORAL_TABLET | ORAL | Status: DC
Start: 2010-05-26 — End: 2010-11-26

## 2010-05-27 ENCOUNTER — Other Ambulatory Visit: Payer: Self-pay | Admitting: *Deleted

## 2010-05-27 MED ORDER — PREDNISONE 10 MG PO TABS: 10.0000 mg | ORAL_TABLET | Freq: Every day | ORAL | Status: DC

## 2010-05-27 NOTE — Telephone Encounter (Signed)
After several attempts of trying to reach the patient and numbers given here and in EMR or not the correct numbers to reach the patient; I have signed the note and will wait for the patient to call us back if she still needs her refills. Also, will need to get a GOOD working number to reach the patient.

## 2010-06-03 ENCOUNTER — Encounter: Payer: Self-pay | Admitting: Pulmonary Disease

## 2010-06-05 ENCOUNTER — Ambulatory Visit: Payer: Self-pay | Admitting: Pulmonary Disease

## 2010-06-12 ENCOUNTER — Encounter: Payer: Self-pay | Admitting: Adult Health

## 2010-06-12 ENCOUNTER — Telehealth: Payer: Self-pay | Admitting: Adult Health

## 2010-06-12 ENCOUNTER — Ambulatory Visit (INDEPENDENT_AMBULATORY_CARE_PROVIDER_SITE_OTHER): Payer: Self-pay | Admitting: Adult Health

## 2010-06-12 VITALS — BP 104/64 | HR 85 | Temp 97.4°F | Ht 66.0 in | Wt 293.0 lb

## 2010-06-12 DIAGNOSIS — E039 Hypothyroidism, unspecified: Secondary | ICD-10-CM

## 2010-06-12 DIAGNOSIS — J449 Chronic obstructive pulmonary disease, unspecified: Secondary | ICD-10-CM

## 2010-06-12 DIAGNOSIS — E8881 Metabolic syndrome: Secondary | ICD-10-CM

## 2010-06-12 DIAGNOSIS — K589 Irritable bowel syndrome without diarrhea: Secondary | ICD-10-CM

## 2010-06-12 DIAGNOSIS — E559 Vitamin D deficiency, unspecified: Secondary | ICD-10-CM

## 2010-06-12 DIAGNOSIS — Z8542 Personal history of malignant neoplasm of other parts of uterus: Secondary | ICD-10-CM

## 2010-06-12 DIAGNOSIS — J4489 Other specified chronic obstructive pulmonary disease: Secondary | ICD-10-CM

## 2010-06-12 DIAGNOSIS — M1990 Unspecified osteoarthritis, unspecified site: Secondary | ICD-10-CM

## 2010-06-12 DIAGNOSIS — G4733 Obstructive sleep apnea (adult) (pediatric): Secondary | ICD-10-CM

## 2010-06-12 DIAGNOSIS — I872 Venous insufficiency (chronic) (peripheral): Secondary | ICD-10-CM

## 2010-06-12 DIAGNOSIS — F341 Dysthymic disorder: Secondary | ICD-10-CM

## 2010-06-12 DIAGNOSIS — K219 Gastro-esophageal reflux disease without esophagitis: Secondary | ICD-10-CM

## 2010-06-12 MED ORDER — ALBUTEROL SULFATE (2.5 MG/3ML) 0.083% IN NEBU: 2.5000 mg | INHALATION_SOLUTION | Freq: Four times a day (QID) | RESPIRATORY_TRACT | Status: DC | PRN

## 2010-06-12 MED ORDER — PREDNISONE 10 MG PO TABS: ORAL_TABLET | ORAL | Status: AC

## 2010-06-12 NOTE — Patient Instructions (Addendum)
Begin Prednisone taper .  Restart  Spiriva daily  Mucinex DM Twice daily  As needed  Cough/congestion Please contact office for sooner follow up if symptoms do not improve or worsen or seek emergency care  follow up Dr. [name]  In 4 weeks.  And As needed

## 2010-06-12 NOTE — Assessment & Plan Note (Addendum)
COPD flare in pt with multiple co-morbities  Unfortunately she does decline O2 which would help with desaturations with walking and nocturnal hypoxia.  Will treat with Prednisone taper however would hope to avoid chronic use due to her obesity.  Will restart spiriva =Samples given.   Plan:  Begin Prednisone taper .  Restart  Spiriva daily  Mucinex DM Twice daily  As needed  Cough/congestion Please contact office for sooner follow up if symptoms do not improve or worsen or seek emergency care  follow up Dr. [name]  In 4 weeks.  And As needed

## 2010-06-12 NOTE — Telephone Encounter (Signed)
ATC x 2. Line busy.  Spoke with the pharmacy and they do have the prednisone rx. They said they tried to reach her by phone but the number they have has been disconnected.

## 2010-06-12 NOTE — Progress Notes (Signed)
Subjective:    Patient ID: Rebecca [name], female    DOB: October 06, 1950, 60 y.o.   MRN: [id number]  HPI Patient is a 60year-old white female patient of Dr. [name] who has a known history of COPD, obstructive sleep apnea, and morbid obesity.now s/p lap band on 09/05/07.   ~ April 11, 2009: she had stopped all meds... saw TP 04/01/09 w/ COPD exac- Rx'd w/ Avelox, Mucinex, Advair, Spiriva> improved... then symptoms came roaring back x 1wk- congested, wheezing, cough, SOB, etc... she tells me that she heats w/ wood & space heaters... also using "mullin & eucalyptus" rec by a homeopath friend in [place]... hasn't been using her nebulizer, and only taking 600mg  Mucinex Bid... REC> we discussed Depo/ Pred, incr Mucinex w/ fluids, incr NEBS regularly, continue Advair500, Spiriva, etc; check CXR (COPD, scarring, NAD); & Labs (/all=OK)...   ~ April 30, 2009: she has mult somatic complaints and "lots of issues w/ breathing"... tired all the time & can't understand why... teels me she passeda kidney stone recently (no med attention)... chest much improved from last OV; on Pred 10mg /d now & wants to wean; seeing DrLorie (eating disorder specialist) to help her not use food & he rec applying for disability... PFT today shows severe airflow obstruction w/ FEV1= 1.51L (55%) & %1sec=59... she wants off Pred.   December 04, 2009 --Presents for an acute office visit. Complains over the last 2 weeks of increased dyspnea and fluid retention. Took extra lasix 6 days ago. Takes Lasix 80mg  most days, but took extra 80mg  that day. Legs have been more swollen. Breathing not as good, wears out easily with minimal actiivity.  March 07, 2010 --Presents for an acute office visit. Complains of sinus pressure/congestion, sneezing, PND, increased SOB x2-3days. OTC not helping Tx w/ Doxy and Prednisone taper.   06/12/2010 Acute OV  Complains of DOE x32months - has been out of prednisone x3weeks.  Was seen 3 months ago for COPD flare tx  with Doxycyline and Prednisone taper. She informs me  she had left over prednisone that she had saved over time and took daily until 3 weeks ago when she ran out. She has a hx of hypoxia -exertional and nocturnal however she did not feel O2 helped her and was more difficult to carry around then help her. She got very upset when I mentioned Oxygen would help her desaturations. She does not wear her CPAP as recommended. She declines xray and/or labs due to cost issues. She was to see Dr. [name]  Last week however missed her appointment- she says she did not know she had this ov. She says she is very frustrated that her weight is going back up and is seeing the therapist on regular basis. She did say she is now qualified for disability and is to get Medicare coverage in Nov of this year.  NO significant cough , no discolored mucus, leg swelling or abdominal pain.   Review of Systems Constitutional:   No  weight loss, night sweats,  Fevers, chills, fatigue, or  Lassitude. +weight gain  HEENT:   No headaches,  Difficulty swallowing,  Tooth/dental problems, or  Sore throat,                No sneezing, itching, ear ache, nasal congestion, post nasal drip,   CV:  No chest pain,  Orthopnea, PND, swelling in lower extremities, anasarca, dizziness, palpitations, syncope.   GI  No heartburn, indigestion, abdominal pain, nausea, vomiting, diarrhea, change in bowel  habits, loss of appetite, bloody stools.   Resp:  No non-productive cough,  No coughing up of blood.  No change in color of mucus.    No chest wall deformity  Skin: no rash or lesions.  GU: no dysuria, change in color of urine, no urgency or frequency.  No flank pain, no hematuria   MS:  No joint pain or swelling.  No decreased range of motion.   Psych:  No change in mood or affect. No memory loss. + depression /anxiety.          Objective:   Physical Exam GEN: A/Ox3; pleasant , NAD, morbidly obese   HEENT:  [place]/AT,  EACs-clear, TMs-wnl,  NOSE-clear, THROAT-clear, no lesions, no postnasal drip or exudate noted.   NECK:  Supple w/ fair ROM; no JVD; normal carotid impulses w/o bruits; no thyromegaly or nodules palpated; no lymphadenopathy.  RESP  Coarse BS w/ no wheezing , diminshed in bases    CARD:  RRR, no m/r/g  , no peripheral edema, pulses intact, no cyanosis or clubbing.  GI:   Soft & nt; nml bowel sounds; no organomegaly or masses detected, morbidly obese abd.   Musco: Warm bil, no deformities or joint swelling noted.   Neuro: alert, no focal deficits noted.    Skin: Warm, no lesions or rashes         Assessment & Plan:

## 2010-06-13 NOTE — Telephone Encounter (Signed)
atc pt x 3 line busy. WCB

## 2010-06-13 NOTE — Telephone Encounter (Signed)
ATC x2 Line busy. Tried additional home number and work number. Home number is not valid and not able to reach at work number, they are closed. Will sign off on msg. Pt will call if having any further question. Pharmacy states they will continue to contact the patient.

## 2010-07-11 ENCOUNTER — Telehealth: Payer: Self-pay | Admitting: Pulmonary Disease

## 2010-07-11 NOTE — Telephone Encounter (Signed)
ATC x 3 line busy WCB.Marland Kitchen 1 sample of each has been left upfront for pick up.

## 2010-07-11 NOTE — Telephone Encounter (Signed)
ATC x 3 - line busy.  WCB 

## 2010-07-14 NOTE — Telephone Encounter (Signed)
ATC x2.  Line busy.  WCB. 

## 2010-07-15 ENCOUNTER — Ambulatory Visit (INDEPENDENT_AMBULATORY_CARE_PROVIDER_SITE_OTHER)
Admission: RE | Admit: 2010-07-15 | Discharge: 2010-07-15 | Disposition: A | Discharge status: Home / Self Care | Payer: Self-pay | Source: Ambulatory Visit | Attending: Pulmonary Disease | Admitting: Pulmonary Disease

## 2010-07-15 ENCOUNTER — Other Ambulatory Visit (INDEPENDENT_AMBULATORY_CARE_PROVIDER_SITE_OTHER): Payer: Self-pay

## 2010-07-15 ENCOUNTER — Encounter: Payer: Self-pay | Admitting: Pulmonary Disease

## 2010-07-15 ENCOUNTER — Ambulatory Visit (INDEPENDENT_AMBULATORY_CARE_PROVIDER_SITE_OTHER): Payer: Self-pay | Admitting: Pulmonary Disease

## 2010-07-15 DIAGNOSIS — M1990 Unspecified osteoarthritis, unspecified site: Secondary | ICD-10-CM

## 2010-07-15 DIAGNOSIS — I872 Venous insufficiency (chronic) (peripheral): Secondary | ICD-10-CM

## 2010-07-15 DIAGNOSIS — J449 Chronic obstructive pulmonary disease, unspecified: Secondary | ICD-10-CM

## 2010-07-15 DIAGNOSIS — K219 Gastro-esophageal reflux disease without esophagitis: Secondary | ICD-10-CM

## 2010-07-15 DIAGNOSIS — E8881 Metabolic syndrome: Secondary | ICD-10-CM

## 2010-07-15 DIAGNOSIS — K589 Irritable bowel syndrome without diarrhea: Secondary | ICD-10-CM

## 2010-07-15 DIAGNOSIS — E039 Hypothyroidism, unspecified: Secondary | ICD-10-CM

## 2010-07-15 DIAGNOSIS — F341 Dysthymic disorder: Secondary | ICD-10-CM

## 2010-07-15 LAB — CBC WITH DIFFERENTIAL/PLATELET
Basophils Relative: 0.4 % (ref 0.0–3.0)
Eosinophils Absolute: 0.3 10*3/uL (ref 0.0–0.7)
Eosinophils Relative: 2.5 % (ref 0.0–5.0)
Hemoglobin: 15.1 g/dL — ABNORMAL HIGH (ref 12.0–15.0)
Lymphocytes Relative: 24.5 % (ref 12.0–46.0)
MCHC: 33.5 g/dL (ref 30.0–36.0)
Neutro Abs: 7.9 10*3/uL — ABNORMAL HIGH (ref 1.4–7.7)
Neutrophils Relative %: 64.1 % (ref 43.0–77.0)
RBC: 4.85 Mil/uL (ref 3.87–5.11)
WBC: 12.3 10*3/uL — ABNORMAL HIGH (ref 4.5–10.5)

## 2010-07-15 LAB — HEPATIC FUNCTION PANEL
ALT: 19 U/L (ref 0–35)
Bilirubin, Direct: 0 mg/dL (ref 0.0–0.3)
Total Bilirubin: 0.5 mg/dL (ref 0.3–1.2)
Total Protein: 6.9 g/dL (ref 6.0–8.3)

## 2010-07-15 LAB — BASIC METABOLIC PANEL
BUN: 21 mg/dL (ref 6–23)
CO2: 30 mEq/L (ref 19–32)
Calcium: 9.3 mg/dL (ref 8.4–10.5)
Creatinine, Ser: 1.4 mg/dL — ABNORMAL HIGH (ref 0.4–1.2)

## 2010-07-15 LAB — HEMOGLOBIN A1C: Hgb A1c MFr Bld: 6.5 % (ref 4.6–6.5)

## 2010-07-15 NOTE — Progress Notes (Signed)
Subjective:    Patient ID: Rebecca [name], female    DOB: 11/01/1950, 60 y.o.   MRN: [id number]  HPI [name] known to me w/ mult med problems as noted below...  ~  January 21, 2009:  6 mo f/u & she is very happy w/ her results from LapBand surgery- states she's lost >100#... our scales show plateau ~250-260# & she hasn't been back to DrMMartin in >68yr due to the $350 charge per visit she says... as noted- she's stopped the O2 ("don't need it, I'm walking daily & doing well");  she called in 9/10 wanting to restart Pred10mg  due to dyspnea but she is off this again & improved... she wanted to change the Zoloft to Wellbutrin but couldn't afford the latter med...  ~  April 11, 2009:  she had stopped all meds... saw TP 04/01/09 w/ COPD exac- Rx'd w/ Avelox, Mucinex, Advair, Spiriva> improved... then symptoms came roaring back x 1wk- congested, wheezing, cough, SOB, etc... she tells me that she heats w/ wood & space heaters... also using "mullin & eucalyptus" rec by a homeopath friend in [place]... hasn't been using her nebulizer, and only taking 600mg  Mucinex Bid... REC> we discussed Depo/ Pred, incr Mucinex w/ fluids, incr NEBS regularly, continue Advair500, Spiriva, etc;  check CXR (COPD, scarring, NAD); & Labs (/all=OK)...  ~  April 30, 2009:  she has mult somatic complaints and "lots of issues w/ breathing"... tired all the time & can't understand why... teels me she passeda kidney stone recently (no med attention)... chest much improved from last OV; on Pred 10mg /d now & wants to wean; seeing DrLorie (eating disorder specialist) to help her not use food & he rec applying for disability... PFT today shows severe airflow obstruction w/ FEV1= 1.51L (55%) & %1sec=59... she wants off Pred.  ~  Jul 15, 2010:  45mo ROV & she has senn TP over the past yr for acute exac- Rx Doxy/ Pred & improved;  She has received disability & will be elig for Medicare this fall she says;  Notes "breathing worse- my  bair goes in but comes right back out";  O2 sat 77% on arrival but not using oxygen because "it doesn't help" I explained why it helps but she isn't convinced; she has motorized wheelchair but can't transport it; she mentioned alternative lifestyle introduced by her daughter...  CXR & labs are stable> encouraged to stay on meds regularly, use O2 regularly, use CPAP nightly, get on diet, get wt down, increase exercise &offered PulmRehab (she declines)...          Problem List:  OBSTRUCTIVE SLEEP APNEA (ICD-327.23) - sleep study 2005 showed RDI=15 w/ desat to 78%, loud snoring & +leg jerks... ... on CPAP 10, uses intermittently... prev on O2 but she let this go...  COPD (ICD-496) - severe obstructive lung disease... ex-smoker, quit 1993... supposed to be on NEBULIZER w/ XOPENEX 1.25mg  regularly (hasn't used), ADVAIR 500Bid + SPIRIVA daily (she freq stops all meds)... she had a second opinion consult at [facility] by DrAdair in 2006... ~  A1AT level is normal 218 (83-200) 3/09... ~  baseline CXR w/ borderline Cor, COPD, interstitial scarring/ atelectasis/ obesity... ~  PFTs 3/07 showed FVC=2.74 (82%), FEV1=1.67 (62%), %1sec=61, mid-flows=27%pred... improved from 2005. ~  CTChest in 2007 & 4/09 showed severe centrilob emyphysema, + interstitial fibrosis, sm HH, left renal cyst... neg for PE... ~  2010:  breathing improved w/ weight reduction after Lap-band surg... ~  2/11: COPD exac  w/ neg CXR (chr changes, NAD), Rx w/ Depo/ Pred/ Avelox/ Mucinex/ NEBS/ Advair/ Spiriva/ etc... ~  PFTs 3/11 showed FVC= 2.57 (73%), FEV1= 1.51 (55%), %1sec=59, mid-flows= 21%pred.  CHEST PAIN (ICD-786.50) - eval by DrWall in 2009. ~  baseline EKG w/ NSR, PVC's, NSSTTWA, NAD... ~  2DEcho 2/05 showed mild MR/ TR, norm LA/ RV, EF=50-55%... ~  NuclearStressTest 4/09 was norm- no ischemia, EF=55%... similiar to prev studies at [facility] 2005 & 2007...  VENOUS INSUFFICIENCY, CHRONIC (ICD-459.81) - Hx chr ven insuffic w/ edema... on LASIX  40mg - 1-2 daily (she self medicates)...  METABOLIC SYNDROME X (ICD-277.7) - she is on diet alone (refused Metformin therapy)... ~  labs 9/08 showed BS=120... FBS 5/08 was 93, HgA1c=7.1 ~  labs 5/10 showed BS= 182, A1c= 6.4 ~  labs 2/11 showed BS= 76, A1c= 6.5 ~  Labs 5/12 showed BS= 103, A1c= 6.5  MORBID OBESITY (ICD-278.01) - she sees [name] for counselling about her eating disorder... ~  max weight ~340# before lap band surg 7/09 by DrMartin. ~  weight 12/09 = 291# ~  weight 5/10 = 265# ~  weight 11/10- = 254# ~  weight 2/11 = 260# ~  Weight 5/12= 290#  HYPOTHYROIDISM (ICD-244.9) - off thyroid meds since 1/10... she was started on Synthroid and followed by DrRSmith in [place] & last seen 7/09- note reviewed... she refuses f/u by [name]- we will recheck TSH off Synthroid Rx... ~  labs 5/10 off Synthroid since 1/10 showed TSH= 1.13 ~  labs 2/11 showed TSH= 1.82 ~  Labs 5/12 showed TSH= 1.81  GERD (ICD-530.81) - prev on Nexium but off all meds since 1/10... IRRITABLE BOWEL SYNDROME (ICD-564.1) - colonoscopy 1982 by DrPatterson was WNL.Marland KitchenMarland Kitchen  UTERINE CANCER, HX OF (ICD-V10.42) - s/p laparoscopic hysterectomy & BSO 7/07 by DrSkinner at [place]...   DEGENERATIVE JOINT DISEASE (ICD-715.90) - on VICODIN tid prn,  ANAPROX 220mg  Bid prn, ULTRAM 50mg  Prn... ? 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 she was on Wellbutrin & Vicodin when last seen in 2006... VITAMIN D DEFICIENCY (ICD-268.9) - Vit D level 5/10 = 16... rec> start OTC Vit D 2000 u daily.  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 she has seen psychiatry in the past (DrBarnett in [place]) and was Dx w/ seasonal affective disorder... prev seeing psychologist [name] for eating disorder counselling...   Past Surgical History  Procedure Date  . Splenectomy at [facility]  10    d/t trauma  . Appendectomy   . Cholecystectomy 1982  . Laparoscopic hysterectomy 2007    forsythe  . Bilateral salpingoophorectomy 2007    forsythe  . Laparoscopic gastric banding 08/2007    Dr. Daphine Deutscher    Outpatient Encounter Prescriptions as of 07/15/2010  Medication Sig Dispense Refill  . albuterol (PROAIR HFA) 108 (90 BASE) MCG/ACT inhaler Inhale 2 puffs into the lungs every 6 (six) hours as needed.        Marland Kitchen albuterol (PROVENTIL) (2.5 MG/3ML) 0.083% nebulizer solution Take 3 mLs (2.5 mg total) by nebulization every 6 (six) hours as needed for wheezing.  60 vial  12  . ALPRAZolam (XANAX) 0.5 MG tablet Take 1/2 to 1 tab by mouth three times a day as needed. Not to exceed 3 per day.       . cetirizine (ZYRTEC) 10 MG tablet Take 10 mg by  mouth daily.        . Fluticasone-Salmeterol (ADVAIR DISKUS) 500-50 MCG/DOSE AEPB Inhale 1 puff into the lungs 2 (two) times daily.        . furosemide (LASIX) 40 MG tablet Take 1 to 2 tabs by mouth once daily as needed for swelling       . Guaifenesin (MUCINEX MAXIMUM STRENGTH) 1200 MG TB12 Take 1 tablet by mouth 2 (two) times daily as needed. With plenty of fluids       . HYDROcodone-acetaminophen (VICODIN) 5-500 MG per tablet Take 1 tablet by mouth 3 (three) times daily as needed.        . levalbuterol (XOPENEX) 1.25 MG/3ML nebulizer solution Use in nebulizer 3 to 4 times daily       . naproxen sodium (ANAPROX) 220 MG tablet Take 2 tabs by mouth once daily       . sertraline (ZOLOFT) 100 MG tablet 1 tablet daily  30 tablet  3  . tiotropium (SPIRIVA) 18 MCG inhalation capsule Place 18 mcg into inhaler and inhale daily.        . traMADol (ULTRAM) 50 MG tablet Take 50 mg by mouth every 6 (six) hours as needed.          Allergies  Allergen Reactions  . Hydrocodone-Acetaminophen     REACTION: itching  . Penicillins     REACTION: nausea and vomiting  . Tramadol     REACTION: itching    Review of Systems         See HPI - all other systems neg  except as noted... The patient complains of dyspnea on exertion and difficulty walking.  The patient denies anorexia, fever, weight loss, weight gain, vision loss, decreased hearing, hoarseness, chest pain, syncope, peripheral edema, prolonged cough, headaches, hemoptysis, abdominal pain, melena, hematochezia, severe indigestion/heartburn, hematuria, incontinence, muscle weakness, suspicious skin lesions, transient blindness, depression, unusual weight change, abnormal bleeding, enlarged lymph nodes, and angioedema.    Objective:   Physical Exam    WD, Morbidly Obese, [name] in NAD... GENERAL:  Alert & oriented; pleasant & cooperative... HEENT:  [place]/AT, EOM-wnl, PERRLA, EACs-clear, TMs-wnl, NOSE-clear, THROAT-clear & wnl. NECK:  Supple w/ fairROM; no JVD; normal carotid impulses w/o bruits; no thyromegaly or nodules palpated; no lymphadenopathy. CHEST:  decr BS bilat; bilat rhonchi, congested, wheezing, dyspneic... HEART:  Regular Rhythm; without murmurs/ rubs/ or gallops heard... ABDOMEN:  Obese, soft & nontender; normal bowel sounds; no organomegaly or masses detected. EXT: without deformities, mod arthritic changes; no varicose veins/ +venous insuffic/trace  tr edema. NEURO:  CNs intact; no focal neuro deficits... DERM: few ecchymoses, no rash etc...   Assessment & Plan:   OSA>  Encouraged to use the CPAP nightly & f/u w/ Sleep Med...  COPD/ Emphysema>  Encouraged to stay on her O2 regularly, continue NEBS, Advair/ Spiriva regularly, use the Mucinex as needed; needs to lose wt & incr exercise- offered PulmRehab but she declines...  Hx CP w/ neg cardiac evals...  OBESITY>  Met syndrome, must get wt down etc...  ? Hx Hypothy>  TFTs remain normal off meds- no problem...  GI>  GERD> she denies symptoms & uses OTC meds prn...  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  Dysthymia>  Much stress in her life, on Alpraz/ Zoloft- offered referral to  Psychiatrist but she declines, has embraced alt lifestyle.Marland KitchenMarland Kitchen

## 2010-07-15 NOTE — Op Note (Signed)
NAME:  Rebecca [name], Rebecca [name]              ACCOUNT NO.:  [id number]   MEDICAL RECORD NO.:  [id number]          PATIENT TYPE:  OIB   LOCATION:  0098                         FACILITY:  [facility]   PHYSICIAN:  Thornton Park. Daphine Deutscher, MD  DATE OF BIRTH:  1950-07-05   DATE OF PROCEDURE:  09/05/2007  DATE OF DISCHARGE:                               OPERATIVE REPORT   PREOPERATIVE DIAGNOSIS:  Morbid obesity, body mass index of 53 with  obstructive pulmonary disease, previous open splenectomy through a left  paramedian incision and open cholecystectomy through a generous right  upper quadrant Kocher incision.   POSTOPERATIVE DIAGNOSIS:  Morbid obesity, body mass index of 53 with  obstructive pulmonary disease, previous open splenectomy through a left  paramedian incision and open cholecystectomy through a generous right  upper quadrant Kocher incision.   PROCEDURE:  Extensive laparoscopic enterolysis (1.5 hours , laparoscopic  placement of APL band.   SURGEON:  Thornton Park. Daphine Deutscher, M.D.   ASSISTANT:  Baruch Merl, M.D.   ANESTHESIA:  General endotracheal.   DESCRIPTION OF PROCEDURE:  Rebecca [name] was a 60 year old white female  who was taken to OR #1 on Monday, September 05, 2007, and given general  anesthesia.  The abdomen was prepped with technique air and draped  sterilely.  I entered the abdomen choosing a site kind of up in the  upper midline because of her previous paramedian incision and her Kocher  incision.  I used an 11-mm 0-degree OptiVu.  Was able to establish  pneumoperitoneum without difficulty without encountering anything other  than the peritoneum.  Once insufflated, however, I saw a wall of  adhesions in the upper abdomen.  I put a 5-mm over more medially.  I  then used that as a toe hold to begin a enterolysis using scissors.  I  took down these adhesions and then was able to create a window through  the adhesions to look down lower abdomen.  Once there, I was able to put  in two 5's  below, put a scope on one side, and then worked my way up.  I  spent an hour and half lysing adhesions using scissors, and eventually  took them down completely.  After doing that, I found she did have an  umbilical hernia and a big hernia next to that, that was consistent with  her previous incisional hernia.  This has been there and not  incarcerated.   I then put her upper GI up on the box and wanted to look to see if she  had any evidence of hiatal hernia.  She had had a previous a  splenectomy, so that affected things somewhat.  I was able to finally  get a Nathanson retractor in, and I used a combination of trocars to  enable [place] to get access to the upper abdomen.  Once the Satira Mccallum was in  place, we did have some difficulty just seeing because she was real  high.  However, I went ahead and went through the pars flaccida approach  and first put in a balloon-tip catheter and blew it up and pulled  it  back, and it seemed to be snug at the EG junction but did not seem to be  a significant hiatal hernia.  I then went along the right crus and  passed a band passer.  Because of the location and her size and the  height of these, there was a tremendous amount of torque on our  instruments, and ended up somewhat bending, but I was able to eventually  to get an APL band in and then threaded through the band passer and  bring it around and then snap it in place.  I snapped it in place over  the band calibration tubing.  It was snug, but it snapped in place.  The  tubing was then withdrawn without difficulty.  I then surveyed the  abdomen.  Everything appeared to be in order.  I brought the tubing out  through the right upper quadrant port, and because of her obesity, I  elected to go ahead and implant this the subcutaneously, and it lays  immediately behind the incision in her right upper quadrant.  It was it  was affixed to Scarpa's fascia with 4-0 Vicryl.  This was after I cut  the tubing  and bled off a little bit of fluid and then connected the  tubing.  The patient seemed to tolerate this procedure well despite its  length of approximately 2-1/2 hours.  The wounds were closed 4-0 Vicryl  and with staples.  She was taken to recovery room in satisfactory  condition.      Thornton Park Daphine Deutscher, MD  Electronically Signed     MBM/MEDQ  D:  09/05/2007  T:  09/05/2007  Job:  [id number]   cc:   [name], MD  [address]  [place]  [place] [address]

## 2010-07-15 NOTE — Patient Instructions (Signed)
Today we updated your med list in EPIC...    Continue your same medications regularly...    We wrote a new prescription for DOXYCYCLINE to use as needed for infection...  Today we did your follow up CXR & blood work...    Please call the PHONE TREE in a few days for your results...    Dial [id number] & when prompted enter your patient number followed by the # symbol...    Your patient number is:  [phone]#  Keep up the good work w/ DrLaurie & your diet program...  Call for any questions...  Let's plan a follow up visit in 6 months or sooner if the need arises.Marland KitchenMarland Kitchen

## 2010-07-15 NOTE — Assessment & Plan Note (Signed)
[facility]                             PULMONARY OFFICE NOTE   NAME:COOKRuthe, [name]                     MRN:          [id number]  DATE:10/06/2006                            DOB:          October 29, 1950    Patient is a 60 year old white female patient of Dr. [name] who has a  known history of COPD, obstructive sleep apnea, and morbid obesity who  presents today complaining of increased lower extremity edema, and  shortness of breath with activity.  Patient also has been noting some  small nosebleeds over the last several days.  Patient denies any  hemoptysis, chest pain, orthopnea, PND.   PAST MEDICAL HISTORY:  Reviewed.   CURRENT MEDICATIONS:  Reviewed.   PHYSICAL EXAMINATION:  Patient is a morbidly obese female, in no acute  distress.  She is afebrile with stable vital signs.  Her 02 saturation  is 95% on 6 L.  HEENT:  Nasal mucosa with some mild redness.  Nontender sinuses.  Posterior pharynx is clear.  NECK:  Supple without cervical adenopathy.  No JVD.  LUNG SOUNDS:  Clear.  CARDIAC:  Regular rate.  ABDOMEN:  Morbidly obese and soft.  EXTREMITIES:  Warm without any calf tenderness, cyanosis, clubbing.  There is 1+ edema bilaterally.   IMPRESSION AND PLAN:  1. Peripheral edema.  Patient is to increase Lasix with an extra 20-mg-      a-day dose over the next 3 days.  Keep lower extremities elevated.      Decrease sodium intake.  Patient will return back with Dr. [name] as      scheduled, or sooner if needed.  2. Rhinitis flare.  Suspect oxygen is drying out her nasal membranes.      Patient is recommended to use saline nasal spray, and hold Nasonex      for now.  Patient will return back as scheduled, or sooner if      needed.      Rubye Oaks, NP  Electronically Signed      [name], MD  Electronically Signed   TP/MedQ  DD: 10/07/2006  DT: 10/07/2006  Job #: [id number]

## 2010-07-15 NOTE — Assessment & Plan Note (Signed)
[facility]                            CARDIOLOGY OFFICE NOTE   JERRIANNE, HARTIN                     MRN:          [id number]  DATE:06/20/2007                            DOB:          Feb 12, 1951    I was asked by Dr. Alroy Dust to consult on Jenita Seashore with chest  pain and shortness of breath.   Ms. Corporan is a very pleasant 60 year old white female who has end-stage  COPD, O2 dependent.  She has had a battle with her weight for years and  is currently considering possible gastric reduction procedure with Dr.  Wenda Low.   She had been having some chest pain that comes through her left scapular  region through chest.  It is described as sharp pain normally.  It is  associated shortness of breath.  Occurs mostly spontaneously but can  occur after eating.  It is not typical reflux symptoms.  She is not that  active.   PAST MEDICAL HISTORY:  She has an intolerance to [facility] and PENICILLIN.  She has no history of dye reaction.  She could not tolerate DOBUTAMINE  in the past because of her heart rate going so fast.  She has had 2  nuclear Cardiolite studies in the past with Dr. Alanda Amass.  I do not  have these in the chart.   She did have a 2-D echocardiogram April 03, 2003 which showed  basically a normal study that was technically difficult.  She had mild  mitral regurgitation, mild tricuspid regurgitation.  Right-sided  function appeared to be normal.  There is no pericardial effusion.   She does have a history of PVCs and some of this is probably due to her  stimulants.   She quit smoking in 1993.  She had a pack per day for 30 years.  She  does not use any alcohol.   She drinks occasional coffee and tea.   SURGICAL HISTORY:  Splenectomy, tonsillectomy, appendectomy,  cholecystectomy, hysterectomy.   CURRENT MEDS:  1. Advair 500/50 b.i.d.  2. Spiriva daily.  3. Protonix 40 mg a day.  4. Celexa 60 mg a day.  5. Levothyroxine  88 mcg a day.  6. Furosemide 40 mg 2 q. a.m.  7. Loratadine 10 mg a day.  8. Sudafed 20 mg a day.  9. Xopenex p.r.n.  10.Xanax p.r.n.  11.Darvocet p.r.n.   FAMILY HISTORY:  Her sister had a heart attack at age 75.   SOCIAL HISTORY:  She works in the Audiological scientist at [employer].  She says they really work with her seeing that she is physically  challenged.  She is single and has 1 child.   EXAM:  She is very pleasant.  She is short of breath just sitting in the  chair with respiratory rate about 24.  Her blood pressure 148/91, heart rate is 99.  EKG is normal except for  rightward axis and PVCs which are unifocal.  Her weight is 331.  She is  5 feet 6 inches.  HEENT:  Normocephalic, atraumatic.  PERRL.  Extraocular movements  intact.  Sclerae are injected.  Facial symmetry is normal.  Her  dentition is in need of repair.  Carotids upstrokes were equal bilaterally without bruits, no JVD.  Thyroid is not enlarged.  Trachea is midline.  Lungs are clear except decreased breath sounds throughout.  HEART:  Reveals a poorly preceded PMI.  She has normal S1-S2.  ABDOMEN:  Soft, good bowel sounds, obesity precludes assessment of  organomegaly.  EXTREMITIES:  Reveal trace edema.  Pulses were 2+/4+ posterior tibial  1+/4+ right dorsalis pedis, 2+/4+ left dorsalis pedis.  NEURO:  Exam is intact.  SKIN:  Unremarkable.   ASSESSMENT:  Chest discomfort in a woman with significant risk of having  obstructive coronary disease.  Since she is considering bariatric  surgery, I would recommend a stress Myoview for screening purposes for  obstructive disease.  Her current symptoms are really hard to sort out.  Her biggest issue is obviously her weight and her chronic obstructive  pulmonary disease.   If her Myoview is negative, will see back on a p.r.n. basis.     Thomas C. Daleen Squibb, MD, [facility]  Electronically Signed    TCW/MedQ  DD: 06/20/2007  DT: 06/20/2007  Job #: [id number]   cc:    [name], MD

## 2010-07-15 NOTE — Discharge Summary (Signed)
[name], [name]              ACCOUNT NO.:  [id number]   MEDICAL RECORD NO.:  [id number]          PATIENT TYPE:  INP   LOCATION:  1533                         FACILITY:  [facility]   PHYSICIAN:  Thornton Park. Daphine Deutscher, MD  DATE OF BIRTH:  August 26, 1950   DATE OF ADMISSION:  09/05/2007  DATE OF DISCHARGE:  09/08/2007                               DISCHARGE SUMMARY   PREOPERATIVE DIAGNOSIS:  Body mass index of 53, morbid obesity with  previous open splenectomy and open cholecystectomy and laparoscopic  hysterectomy.   PROCEDURE:  September 05, 2007.  Extensive laparoscopic enterolysis over an  hour with placement of an APL band.   PROCEDURE:  Rebecca [name] is a 60 year old lady with really significant  COPD in addition to her morbid obesity.  She has followed by [facility] Cloud.  [name], M.D.  She underwent the above-mentioned operation on September 05, 2007  and was observed in the intensive care unit because of her respiratory  status, and did have some bigeminy noted perioperatively.  We advanced  her diet and then, because of her borderline pulmonary function, Dr.  [name] was asked to see her and did see her, and checked her over prior  to her discharge and will follow up her in the office subsequently.  She  was tolerating her high protein shake diet at that time, understood  discharge instructions.  Her incisions look good and her staples were  removed.  She had Darvocet at home to take for pain.  Follow up in the  office within 2 week period.   FINAL DIAGNOSES:  1. Morbid obesity.  2. Multiple comorbidities status post laparoscopic adjustable gastric      APL band.      Thornton Park Daphine Deutscher, MD  Electronically Signed     MBM/MEDQ  D:  09/26/2007  T:  09/26/2007  Job:  [id number]   cc:   [name], MD  [address]  [place]  [place] [address]   Dr. Tressie Stalker   Dr. Katrinka Blazing  [facility]

## 2010-07-15 NOTE — Telephone Encounter (Signed)
Checked front desk, no samples there. Per office protocol will sign of. To create new telephone note when pt calls back.

## 2010-07-17 ENCOUNTER — Encounter: Payer: Self-pay | Admitting: Pulmonary Disease

## 2010-07-18 NOTE — Letter (Signed)
September 17, 2005     Dr. Madaline Guthrie  [facility]  Fax number [phone]   RE:  Rebecca, [name]  MRN:  [id number]  /  DOB:  1950-03-23   Dear Dr. Clifton James:   [name] is a 60 year old white female whom we share.  I  have seen her for respiratory problems over the years and she has a local  cardiologist, Dr. Susa Griffins, who has attended her for cardiac issues  as well.  As you know, she was recently diagnosed with endometrial cancer  and is scheduled for GYN surgery on September 24, 2005.  I wanted to send you  this note in advance of that visit and include recent lab work and history  of her medications.   As noted, I have attended Rebecca [name] for several years with severe COPD and  asthmatic bronchitis.  She also has obstructive sleep apnea, morbid obesity,  and is CPAP at 10 cmH20 pressure.  She has had previous pulmonary evaluation  as well at [facility] by Dr. Susa Simmonds.  Her current medications include  Advair 500/50 one puff b.i.d.; Singulair 10 mg p.o. daily; Spiriva one  capsule inhaled daily; and Lasix 40 mg a day intermittently, regulated by  Dr. Alanda Amass.  She takes AcipHex 20 mg daily for reflux.  She also has  Celexa 40 mg daily, Wellbutrin 300 mg daily - these prescribed by Dr.  Electa Sniff, her psychiatrist in [facility].   Delane Ginger is on home oxygen as well using 1 L at rest and 2 L with exercise.  Her  last spirometry showed an FEV1 of 1.67 which was 62% of the predicted value,  and her FEV1/FVC ratio was 60%.  Her last chest x-ray shows morbid obesity,  some crowding of the markings at the bases, but no acute abnormality.  Recent lab work is also included with this note and I make special notation  of her BNP which is normal at 15.   Rebecca [name] other medical problems include gastroesophageal reflux disease,  irritable bowel syndrome, degenerative arthritis, fibromyalgia, and chronic  fatigue symptoms.  She has quite a bit of  anxiety and is under the care of a  psychiatrist as noted above.  She is an ex-smoker and quit in 1993.   During her recent evaluation, her blood pressure was 142/88, pulse 110 and  regular, respirations 22 per minute, with an O2 saturation of 90% on 4 L  after mild ambulation in the office.  Her weight is approximately 315  pounds.  HEENT exam was unremarkable.  Neck exam showed no jugular venous  distention and no carotid bruits.  Chest exam revealed decrease in breath  sounds without wheezes, rales, or rhonchi heard.  Cardiac exam revealed a  regular rhythm, grade 1/6 systolic ejection murmur at the left sternal  border without rubs or gallops heard.  The abdomen was obese but soft and  nontender without evidence of organomegaly or masses.  Extremities showed  some venous insufficiency and trace edema.  She was somewhat anxious and  depressed.  There were no significant skin lesions noted.   I recommended to Rebecca [name] that she start on Mucinex 600 mg one or two tablets  twice a day and consider restarting her Lasix at 20-40 each morning for her  edema.  I understand that she was having a followup with Dr. Alanda Amass prior  to the surgery as well.  I hope this information is helpful to you; if  I can  be of further assistance please let me know.    Sincerely,      [name], MD   SMN/MedQ  DD:  09/17/2005  DT:  09/17/2005  Job #:  [id number]

## 2010-07-18 NOTE — Assessment & Plan Note (Signed)
[name]                               PULMONARY OFFICE NOTE   Rebecca [name], Rebecca [name]                     MRN:          [id number]  DATE:11/16/2005                            DOB:          17-Jul-1950    HISTORY OF PRESENT ILLNESS:  The patient is a 60 year old white female  patient of Dr. Jodelle Green that has a known history of obstructive sleep apnea  and COPD, with morbid obesity.  Patient presents for a 1 week history of  increased lower extremity swelling.  Patient complains that she feels very  achy all over and has gained approximately 9 pounds over the last two weeks.  Patient denies any cough, fever, hemoptysis, orthopnea, PND or chest pain.  Patient has not used  any over the counter products for treatment.   PAST MEDICAL HISTORY:  Reviewed.   CURRENT MEDICATIONS:  Reviewed.   PHYSICAL EXAMINATION:  Patient is a pleasant morbidly obese female in no  acute distress.  She is afebrile today with normal vital signs.  02  saturation 97% on 4 liters.  HEENT:  Is unremarkable.  NECK:  Is supple  without adenopathy.  No JVD.  LUNGS:  Somewhat diminished in the bases, otherwise clear.  CARDIAC:  Regular rate and rhythm without murmur, rub or gallop.  ABDOMEN:  Is morbidly obese and soft.  EXTREMITIES:  Warm  and dry without any calf tenderness, cyanosis or  clubbing.  There is 1+ to 2+ edema.   IMPRESSION/PLAN:  Suspected fluid overload.  Patient to increase her Lasix  up to an extra 40 mg tablet over the next two days and then on an as needed  basis.  She is recommended to keep lower extremities elevated, decrease her  sodium intake and a BMET and BNP are pending.  Patient will recheck here in  2 weeks with Dr. [name] or sooner if needed.                                   Rubye Oaks, NP                                [name], MD   TP/MedQ  DD:  11/16/2005  DT:  11/17/2005  Job #:  [id number]

## 2010-07-18 NOTE — Assessment & Plan Note (Signed)
[facility]                             PULMONARY OFFICE NOTE   NAME:Rebecca [name], Rebecca [name]                     MRN:          [id number]  DATE:05/31/2006                            DOB:          04-18-1950    HISTORY OF PRESENT ILLNESS:  The patient is a 60 year old white female,  patient of Dr. [name], who has a known history of COPD, obstructive sleep  apnea on nocturnal CPAP, morbid obesity, degenerative joint disease,  fibromyalgia, and some hypothyroidism.  The patient presents today for a  6-week office visit.  The patient presents today reporting that she  seems to be much improved since starting on thyroid medicine.  The  patient has been recently followed by Dr. 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  And, the patient reports she feels much  improved after starting on thyroid medicine.  She is presently on  Synthroid 75 mcg daily.  The patient denies any chest pain, orthopnea,  PND, or increased leg swelling.   PAST MEDICAL HISTORY:  Reviewed.   CURRENT MEDICATIONS:  Reviewed.   PHYSICAL EXAMINATION:  GENERAL:  The patient is a pleasant female in no  acute distress.  VITAL SIGNS:  She is afebrile with stable vital signs.  O2 saturation is  91% on room air.  HEENT:  Unremarkable.  NECK:  Supple without cervical adenopathy.  No JVD.  RESPIRATORY:  Lung sounds reveal diminished breath sounds at the bases,  otherwise clear.  CARDIAC:  Regular rate.  ABDOMEN:  Morbidly obese.  It is soft.  EXTREMITIES:  Warm without any calf tenderness, cyanosis, or clubbing.  There are venous insufficiency changes and trace edema.   IMPRESSION AND PLAN:  1. Chronic obstructive pulmonary disease and asthmatic bronchitis.      The patient appears to be well compensated on Advair and Spiriva.      The patient has been on and off of Singulair recently and have      recommended  that she may go ahead and discontinue this due to      recent press release of increased depression-like symptoms.  The      patient will return back with Dr. [name] in 2-3 months or sooner if      needed.  2. Depression.  She appears to be well compensated on Celexa daily.      The patient has recently discontinued off of Wellbutrin without      flare of symptoms.  3. Gastroesophageal reflux, well compensated.  The patient is      encouraged on diet and exercise and antireflux preventative      measures.  4. Venous insufficiency with edema.  The patient is well controlled on      Lasix 80 mg daily.  The patient is to continue with support      stockings along with elevation of legs and low sodium diet.  5. Osteoarthritis and fibromyalgia.  The patient is to continue on her      exercise and stress-reducing exercises.  6. Secondary  hypothyroidism.  The patient will continue to follow up      with Dr. Katrinka Blazing as scheduled.      Rubye Oaks, NP  Electronically Signed      [name], MD  Electronically Signed   TP/MedQ  DD: 05/31/2006  DT: 05/31/2006  Job #: [id number]

## 2010-07-18 NOTE — Assessment & Plan Note (Signed)
 [facility]ALTHCARE                             PULMONARY OFFICE NOTE   Rebecca, [name]                     MRN:          [id number]  DATE:04/05/2006                            DOB:          08/29/1950    This is a 60 year old lady with past medical history significant for  COPD, depression, obesity, GERD and hypothyroidism who comes today  because for the last 3 or 4 days she has been having a cough with  yellowish sputum and also she has been having more shortness of breath  than normal. She also has had some fever and chills for one day. She  denies any chest pain, palpitations.   CURRENT MEDICATIONS:  Reviewed. She had not been using Lasix lately. She  uses it on a p.r.n. basis.   PHYSICAL EXAMINATION:  This is an obese lady in mild respiratory  distress, but talking in full sentences, very pleasant. Temperature is  99.4, blood pressure is 152/84, Pulse is 104, oxygen saturation is 91%  on 4 L of oxygen.  HEENT: She has pharynx is clear with no exudates.  LUNGS:  She has decreased air movement with mild inspiratory wheezes,  but no crackles.  HEART: Is regular rhythm, mildly tachycardic, no murmurs.  ABDOMEN: Soft and nontender. Bowel sounds are positive.  LOWER EXTREMITIES: She has 1+ pitting edema up to half to the knees.   ASSESSMENT:  1. Chronic obstructive pulmonary disease exacerbation, most probably      secondary to viral bronchitis. Plan: Is doxycycline 100 mg p.o.      b.i.d. for seven days, Mucinex and steroid taper 40 mg for two      days, 30 for two days and then decrease by 10 mg for two days until      finishing. She was advised to come back to our clinic or to the      emergency room in case of worsening of pulmonary symptoms.  2. For all of her chronic diseases, she is to continue on her regular      medications, plus she was advised to use Lasix on a daily basis for      at least 3 to 4 days to get rid of the edema and to  help also with      her breathing.      Dennis Bast, MD      [name], MD  Electronically Signed   Rivka Safer  DD: 04/05/2006  DT: 04/05/2006  Job #: [id number]

## 2010-07-18 NOTE — Letter (Signed)
Jul 14, 2006    [facility]  [address]  [place], [place] [address]   RE:  Rebecca [name], Rebecca [name]  MRN:  [id number]  /  DOB:  [birth year]   To Whom It May Concern,   [name] is a 60 year old white female home I follow  for general medical purposes.  This letter is being sent you and support  of her quest for quest for bariatric surgery.  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  I have been her primary care internist for several  years now.  I believe that she would be an excellent candidate for  bariatric control of her morbid obesity.  Her current weight is in the  neighborhood of 325 pounds.  She has been unable to lose weight despite  multiple diet attempts and exercise programs in the past.  She started  gaining weight in the 1980s; and soon reached over 200 pounds and never  looked back.  In 1993 she was 230 pounds.  In 1994 she was 250 pounds.  She remained at this wait for several years, and I have a similar weight  in 1997 and 1998.  In 1999 she was up to 256 pounds.  In 2000 she hit  280 pounds.  In 2002 she was to a 287 pounds.  In 2004 she first topped  300 pounds.  She remained between 300 and 310 pounds for the next  several years.  Most recently she has gained to 325 pounds.   Rebecca [name] has been counseled extensively on diet and exercise program.  She has had a good deal of difficulty with exercise due to her  underlying COPD an asthmatic bronchitis.  She also has obstructive sleep  apnea with a positive sleep study in 2005 and uses a CPAP.  She is  oxygen dependent and has a good bit of difficulty getting into work at  her job at [employer].   Rebecca [name] other medical problems include:  Fibromyalgia, question of  hypothyroidism for which she was started on a low-dose of Synthroid by  an endocrinologist in [facility].  History of headaches  and anxiety;  along with an element of depression for which she has been seen a  psychiatrist Dr. Electa Sniff and a counselor Dr. Kerin Perna.  She has  gastroesophageal reflux disease and irritable bowel syndrome followed by  Dr. Victorino Dike.  She has had a cardiac evaluation by Dr. Alanda Amass.   Her current medications include:  and there are 500/51 puff b.i.d.,  Spiriva 1-1/2 capsules daily, nebulizer was Xopenex for p.r.n. use,  Lasix 40 mg as needed, Protonix 40 mg a day, Synthroid 75 mcg daily,  Celexa 40 mg a day, Xanax .5 mg p.o. as needed.   On recent examination her weight was 325 pounds.  She is approximately 5  feet 6 inches tall putting her body mass index around 50.  Blood  pressure 118/80, pulse 92 per minute and regular, respirations 18 per  minute and not labored.  Oxygen saturation 98% on 2 L oxygen by nasal  cannula.  HEENT Exam:  Unremarkable.  Neck exam showed no jugular venous  distention, no carotid bruits, no thyromegaly, or lymphadenopathy.  Chest exam clear to percussion and auscultation with decreased breath  sounds from poor excursion.  Cardiac exam revealed a regular rhythm with  a grade  1/6 systolic ejection murmur in the left sternal border.  No  rubs or gallops identified.  Abdomen was obese, but soft and nontender  without evidence of organomegaly or masses.  Bowel sounds were intact.  Extremities showed some degenerative arthritis was some venous  insufficiency with trace edema.  Neurologic exam was intact without  focal abnormalities detected.  Skin exam was negative.   This letter is being generated at the patient's request to be sent to  [facility] in support of her initiative to have bariatric  surgery.  Please let me know if I can be of any further assistance.    Sincerely,      [name], MD    SMN/MedQ  DD: 07/14/2006  DT: 07/14/2006  Job #: [id number]

## 2010-08-13 ENCOUNTER — Telehealth: Payer: Self-pay | Admitting: Pulmonary Disease

## 2010-08-13 MED ORDER — FUROSEMIDE 40 MG PO TABS: ORAL_TABLET | ORAL | Status: DC

## 2010-08-13 NOTE — Telephone Encounter (Signed)
I sent her refill- ATC pt and line busy x 3

## 2010-08-14 NOTE — Telephone Encounter (Signed)
ATC pt to inform her rx sent but line busy x 3.  WCB

## 2010-08-14 NOTE — Telephone Encounter (Signed)
Called number in system and a message stated the number has been changed and the new number is unknown. I will sign off on message. Carron Curie, CMA

## 2010-09-16 ENCOUNTER — Telehealth: Payer: Self-pay | Admitting: Pulmonary Disease

## 2010-09-16 NOTE — Telephone Encounter (Signed)
Pt came by. Picked up samples. Nothing further needed. Rebecca [name]

## 2010-09-16 NOTE — Telephone Encounter (Signed)
Noted  

## 2010-09-16 NOTE — Telephone Encounter (Signed)
Samples placed up front for pt to pick up.  ATC x1. Line busy.

## 2010-09-26 ENCOUNTER — Other Ambulatory Visit: Payer: Self-pay | Admitting: *Deleted

## 2010-09-26 MED ORDER — ALPRAZOLAM 0.5 MG PO TABS: ORAL_TABLET | ORAL | Status: DC

## 2010-11-20 ENCOUNTER — Telehealth: Payer: Self-pay | Admitting: Adult Health

## 2010-11-20 ENCOUNTER — Ambulatory Visit (INDEPENDENT_AMBULATORY_CARE_PROVIDER_SITE_OTHER): Payer: Medicare Other | Admitting: Adult Health

## 2010-11-20 ENCOUNTER — Encounter: Payer: Self-pay | Admitting: Adult Health

## 2010-11-20 ENCOUNTER — Encounter: Payer: Self-pay | Admitting: *Deleted

## 2010-11-20 DIAGNOSIS — J449 Chronic obstructive pulmonary disease, unspecified: Secondary | ICD-10-CM

## 2010-11-20 DIAGNOSIS — G4733 Obstructive sleep apnea (adult) (pediatric): Secondary | ICD-10-CM

## 2010-11-20 NOTE — Progress Notes (Signed)
Addended by: Christen Butter on: 11/20/2010 12:35 PM   Modules accepted: Orders

## 2010-11-20 NOTE — Telephone Encounter (Signed)
I have already mailed her summons to the clerk of courts along with excuse from jury duty. The part at the bottom that she signed, I meant to hand back to her before she left but I forgot and so I mailed this to her earlier for her to keep for her records. I have tried calling her x 3 to let her know and line busy

## 2010-11-20 NOTE — Assessment & Plan Note (Signed)
No flare at present. Will restart O2 as pt has agreed to restart Desats with walking  O2 order sent to DME.

## 2010-11-20 NOTE — Progress Notes (Signed)
Subjective:    Patient ID: Rebecca [name], female    DOB: 1950-09-04, 60 y.o.   MRN: [id number]  HPI  [name] known to me w/ mult med problems as noted below...  ~  January 21, 2009:  6 mo f/u & she is very happy w/ her results from LapBand surgery- states she's lost >100#... our scales show plateau ~250-260# & she hasn't been back to DrMMartin in >43yr due to the $350 charge per visit she says... as noted- she's stopped the O2 ("don't need it, I'm walking daily & doing well");  she called in 9/10 wanting to restart Pred10mg  due to dyspnea but she is off this again & improved... she wanted to change the Zoloft to Wellbutrin but couldn't afford the latter med...  ~  April 11, 2009:  she had stopped all meds... saw TP 04/01/09 w/ COPD exac- Rx'd w/ Avelox, Mucinex, Advair, Spiriva> improved... then symptoms came roaring back x 1wk- congested, wheezing, cough, SOB, etc... she tells me that she heats w/ wood & space heaters... also using "mullin & eucalyptus" rec by a homeopath friend in [place]... hasn't been using her nebulizer, and only taking 600mg  Mucinex Bid... REC> we discussed Depo/ Pred, incr Mucinex w/ fluids, incr NEBS regularly, continue Advair500, Spiriva, etc;  check CXR (COPD, scarring, NAD); & Labs (/all=OK)...  ~  April 30, 2009:  she has mult somatic complaints and "lots of issues w/ breathing"... tired all the time & can't understand why... teels me she passeda kidney stone recently (no med attention)... chest much improved from last OV; on Pred 10mg /d now & wants to wean; seeing DrLorie (eating disorder specialist) to help her not use food & he rec applying for disability... PFT today shows severe airflow obstruction w/ FEV1= 1.51L (55%) & %1sec=59... she wants off Pred.  ~  Jul 15, 2010:  18mo ROV & she has senn TP over the past yr for acute exac- Rx Doxy/ Pred & improved;  She has received disability & will be elig for Medicare this fall she says;  Notes "breathing worse- my  bair goes in but comes right back out";  O2 sat 77% on arrival but not using oxygen because "it doesn't help" I explained why it helps but she isn't convinced; she has motorized wheelchair but can't transport it; she mentioned alternative lifestyle introduced by her daughter...  CXR & labs are stable> encouraged to stay on meds regularly, use O2 regularly, use CPAP nightly, get on diet, get wt down, increase exercise &offered PulmRehab (she declines)...  11/20/2010 Follow up /Jury letter  Pt states needs excuse from jury duty due to her breathing issues. She states her breathing is the same since last visit- no better or worse.  She wants to start back on CPAP and O2. She now has medicare /insurance .  Previously on CPAP but stopped . Also on O2 but returned.  Still wears out with minimal activity .  Declines flu shot          Problem List:  OBSTRUCTIVE SLEEP APNEA (ICD-327.23) - sleep study 2005 showed RDI=15 w/ desat to 78%, loud snoring & +leg jerks... ... on CPAP 10, uses intermittently... prev on O2 but she let this go...  COPD (ICD-496) - severe obstructive lung disease... ex-smoker, quit 1993... supposed to be on NEBULIZER w/ XOPENEX 1.25mg  regularly (hasn't used), ADVAIR 500Bid + SPIRIVA daily (she freq stops all meds)... she had a second opinion consult at [facility] by DrAdair in 2006... ~  A1AT level is normal 218 (83-200) 3/09... ~  baseline CXR w/ borderline Cor, COPD, interstitial scarring/ atelectasis/ obesity... ~  PFTs 3/07 showed FVC=2.74 (82%), FEV1=1.67 (62%), %1sec=61, mid-flows=27%pred... improved from 2005. ~  CTChest in 2007 & 4/09 showed severe centrilob emyphysema, + interstitial fibrosis, sm HH, left renal cyst... neg for PE... ~  2010:  breathing improved w/ weight reduction after Lap-band surg... ~  2/11: COPD exac w/ neg CXR (chr changes, NAD), Rx w/ Depo/ Pred/ Avelox/ Mucinex/ NEBS/ Advair/ Spiriva/ etc... ~  PFTs 3/11 showed FVC= 2.57 (73%), FEV1= 1.51 (55%), %1sec=59,  mid-flows= 21%pred.  CHEST PAIN (ICD-786.50) - eval by DrWall in 2009. ~  baseline EKG w/ NSR, PVC's, NSSTTWA, NAD... ~  2DEcho 2/05 showed mild MR/ TR, norm LA/ RV, EF=50-55%... ~  NuclearStressTest 4/09 was norm- no ischemia, EF=55%... similiar to prev studies at [facility] 2005 & 2007...  VENOUS INSUFFICIENCY, CHRONIC (ICD-459.81) - Hx chr ven insuffic w/ edema... on LASIX 40mg - 1-2 daily (she self medicates)...  METABOLIC SYNDROME X (ICD-277.7) - she is on diet alone (refused Metformin therapy)... ~  labs 9/08 showed BS=120... FBS 5/08 was 93, HgA1c=7.1 ~  labs 5/10 showed BS= 182, A1c= 6.4 ~  labs 2/11 showed BS= 76, A1c= 6.5 ~  Labs 5/12 showed BS= 103, A1c= 6.5  MORBID OBESITY (ICD-278.01) - she sees [name] for counselling about her eating disorder... ~  max weight ~340# before lap band surg 7/09 by DrMartin. ~  weight 12/09 = 291# ~  weight 5/10 = 265# ~  weight 11/10- = 254# ~  weight 2/11 = 260# ~  Weight 5/12= 290#  HYPOTHYROIDISM (ICD-244.9) - off thyroid meds since 1/10... she was started on Synthroid and followed by DrRSmith in [place] & last seen 7/09- note reviewed... she refuses f/u by [name]- we will recheck TSH off Synthroid Rx... ~  labs 5/10 off Synthroid since 1/10 showed TSH= 1.13 ~  labs 2/11 showed TSH= 1.82 ~  Labs 5/12 showed TSH= 1.81  GERD (ICD-530.81) - prev on Nexium but off all meds since 1/10... IRRITABLE BOWEL SYNDROME (ICD-564.1) - colonoscopy 1982 by DrPatterson was WNL.Marland KitchenMarland Kitchen  UTERINE CANCER, HX OF (ICD-V10.42) - s/p laparoscopic hysterectomy & BSO 7/07 by DrSkinner at [place]...   DEGENERATIVE JOINT DISEASE (ICD-715.90) - on VICODIN tid prn,  ANAPROX 220mg  Bid prn, ULTRAM 50mg  Prn... ? 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 she was on Wellbutrin & Vicodin when last seen in 2006... VITAMIN D DEFICIENCY (ICD-268.9) - Vit D level 5/10 = 16... rec> start OTC Vit D 2000 u  daily.  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 she has seen psychiatry in the past (DrBarnett in [place]) and was Dx w/ seasonal affective disorder... prev seeing psychologist [name] for eating disorder counselling...   Past Surgical History  Procedure Date  . 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 Appendectomy   . Cholecystectomy 1982  . Laparoscopic hysterectomy 2007    forsythe  . Bilateral salpingoophorectomy 2007    forsythe  . Laparoscopic gastric banding 08/2007    Dr. Daphine Deutscher    Outpatient Encounter Prescriptions as of 07/15/2010  Medication Sig Dispense Refill  . albuterol (PROAIR HFA) 108 (90 BASE) MCG/ACT inhaler Inhale 2 puffs into the lungs every 6 (six) hours as needed.        Marland Kitchen  albuterol (PROVENTIL) (2.5 MG/3ML) 0.083% nebulizer solution Take 3 mLs (2.5 mg total) by nebulization every 6 (six) hours as needed for wheezing.  60 vial  12  . ALPRAZolam (XANAX) 0.5 MG tablet Take 1/2 to 1 tab by mouth three times a day as needed. Not to exceed 3 per day.       . cetirizine (ZYRTEC) 10 MG tablet Take 10 mg by mouth daily.        . Fluticasone-Salmeterol (ADVAIR DISKUS) 500-50 MCG/DOSE AEPB Inhale 1 puff into the lungs 2 (two) times daily.        . furosemide (LASIX) 40 MG tablet Take 1 to 2 tabs by mouth once daily as needed for swelling       . Guaifenesin (MUCINEX MAXIMUM STRENGTH) 1200 MG TB12 Take 1 tablet by mouth 2 (two) times daily as needed. With plenty of fluids       . HYDROcodone-acetaminophen (VICODIN) 5-500 MG per tablet Take 1 tablet by mouth 3 (three) times daily as needed.        . levalbuterol (XOPENEX) 1.25 MG/3ML nebulizer solution Use in nebulizer 3 to 4 times daily       . naproxen sodium (ANAPROX) 220 MG tablet Take 2 tabs by mouth once daily       . sertraline (ZOLOFT) 100 MG tablet 1 tablet daily  30 tablet  3  . tiotropium (SPIRIVA) 18 MCG inhalation capsule  Place 18 mcg into inhaler and inhale daily.        . traMADol (ULTRAM) 50 MG tablet Take 50 mg by mouth every 6 (six) hours as needed.          Allergies  Allergen Reactions  . Hydrocodone-Acetaminophen     REACTION: itching  . Penicillins     REACTION: nausea and vomiting  . Tramadol     REACTION: itching    Review of Systems          See HPI - all other systems neg except as noted... The patient complains of dyspnea on exertion and difficulty walking.  The patient denies anorexia, fever, weight loss, weight gain, vision loss, decreased hearing, hoarseness, chest pain, syncope, peripheral edema, prolonged cough, headaches, hemoptysis, abdominal pain, melena, hematochezia, severe indigestion/heartburn, hematuria, incontinence, muscle weakness, suspicious skin lesions, transient blindness, depression, unusual weight change, abnormal bleeding, enlarged lymph nodes, and angioedema.    Objective:   Physical Exam     WD, Morbidly Obese, [name] in NAD... GENERAL:  Alert & oriented; pleasant & cooperative... HEENT:  [place]/AT, EOM-wnl, PERRLA, EACs-clear, TMs-wnl, NOSE-clear, THROAT-clear & wnl. NECK:  Supple w/ fairROM; no JVD; normal carotid impulses w/o bruits; no thyromegaly or nodules palpated; no lymphadenopathy. CHEST:  decr BS bilat; bilat rhonchi, congested, wheezing, dyspneic... HEART:  Regular Rhythm; without murmurs/ rubs/ or gallops heard... ABDOMEN:  Obese, soft & nontender; normal bowel sounds; no organomegaly or masses detected. EXT: without deformities, mod arthritic changes; no varicose veins/ +venous insuffic/trace  tr edema. NEURO:  CNs intact; no focal neuro deficits... DERM:   no rash etc...   Assessment & Plan:

## 2010-11-20 NOTE — Patient Instructions (Addendum)
We are setting up for referral to home care companies to set up Oxygen and CPAP  Jury letter today   follow up Dr. [name]  As planned and As needed

## 2010-11-21 NOTE — Telephone Encounter (Signed)
ATC x 2 line busy  

## 2010-11-21 NOTE — Telephone Encounter (Signed)
ATC x 2. Line busy. WCB later. 

## 2010-11-24 NOTE — Telephone Encounter (Signed)
ATC line busy, will sign msg per protocol

## 2010-11-26 ENCOUNTER — Other Ambulatory Visit: Payer: Self-pay | Admitting: Pulmonary Disease

## 2010-11-27 LAB — CARDIAC PANEL(CRET KIN+CKTOT+MB+TROPI): Total CK: 140

## 2010-11-27 LAB — BASIC METABOLIC PANEL
BUN: 21
CO2: 27
Calcium: 9.2
Calcium: 8.6
Chloride: 104
GFR calc Af Amer: >60
GFR calc non Af Amer: 53 — ABNORMAL LOW
GFR calc non Af Amer: 56 — ABNORMAL LOW
Glucose, Bld: 98
Glucose, Bld: 161 — ABNORMAL HIGH
Potassium: 4.6
Sodium: 142
Sodium: 139

## 2010-11-27 LAB — DIFFERENTIAL
Basophils Absolute: 0.1
Basophils Relative: 0
Eosinophils Absolute: 0
Lymphocytes Relative: 20
Monocytes Absolute: 1.2 — ABNORMAL HIGH
Monocytes Relative: 11
Neutro Abs: 7.4
Neutrophils Relative %: 66
Neutrophils Relative %: 85 — ABNORMAL HIGH

## 2010-11-27 LAB — CBC
Hemoglobin: 15
Hemoglobin: 16.6 — ABNORMAL HIGH
MCHC: 32.5
MCHC: 32.6
MCV: 93.5
MCV: 94
Platelets: 258
RBC: 4.83
RBC: 5.46 — ABNORMAL HIGH
RDW: 14.7
RDW: 15.1

## 2011-01-14 ENCOUNTER — Ambulatory Visit (INDEPENDENT_AMBULATORY_CARE_PROVIDER_SITE_OTHER): Payer: Medicare Other | Admitting: Pulmonary Disease

## 2011-01-14 ENCOUNTER — Encounter: Payer: Self-pay | Admitting: Pulmonary Disease

## 2011-01-14 DIAGNOSIS — Z8542 Personal history of malignant neoplasm of other parts of uterus: Secondary | ICD-10-CM

## 2011-01-14 DIAGNOSIS — K589 Irritable bowel syndrome without diarrhea: Secondary | ICD-10-CM

## 2011-01-14 DIAGNOSIS — R0989 Other specified symptoms and signs involving the circulatory and respiratory systems: Secondary | ICD-10-CM

## 2011-01-14 DIAGNOSIS — K219 Gastro-esophageal reflux disease without esophagitis: Secondary | ICD-10-CM

## 2011-01-14 DIAGNOSIS — G4733 Obstructive sleep apnea (adult) (pediatric): Secondary | ICD-10-CM

## 2011-01-14 DIAGNOSIS — I872 Venous insufficiency (chronic) (peripheral): Secondary | ICD-10-CM

## 2011-01-14 DIAGNOSIS — R0609 Other forms of dyspnea: Secondary | ICD-10-CM

## 2011-01-14 DIAGNOSIS — J4489 Other specified chronic obstructive pulmonary disease: Secondary | ICD-10-CM

## 2011-01-14 DIAGNOSIS — E8881 Metabolic syndrome: Secondary | ICD-10-CM

## 2011-01-14 DIAGNOSIS — J449 Chronic obstructive pulmonary disease, unspecified: Secondary | ICD-10-CM

## 2011-01-14 DIAGNOSIS — E039 Hypothyroidism, unspecified: Secondary | ICD-10-CM

## 2011-01-14 DIAGNOSIS — M1990 Unspecified osteoarthritis, unspecified site: Secondary | ICD-10-CM

## 2011-01-14 DIAGNOSIS — F341 Dysthymic disorder: Secondary | ICD-10-CM

## 2011-01-14 DIAGNOSIS — R0789 Other chest pain: Secondary | ICD-10-CM

## 2011-01-14 NOTE — Progress Notes (Signed)
Subjective:    Patient ID: Rebecca [name], female    DOB: May 18, 1950, 60 y.o.   MRN: [id number]  HPI  [name] known to me w/ mult med problems as noted below...  ~  January 21, 2009:  6 mo f/u & she is very happy w/ her results from LapBand surgery- states she's lost >100#... our scales show plateau ~250-260# & she hasn't been back to DrMMartin in >73yr due to the $350 charge per visit she says... as noted- she's stopped the O2 ("don't need it, I'm walking daily & doing well");  she called 9/10 wanting to restart Pred10mg  due to dyspnea but she is off this again & improved... she wanted to change the Zoloft to Wellbutrin but couldn't afford the latter med...  ~  April 11, 2009:  she had stopped all meds... saw TP 04/01/09 w/ COPD exac- Rx'd w/ Avelox, Mucinex, Advair, Spiriva> improved... then symptoms came roaring back x 1wk- congested, wheezing, cough, SOB, etc... she tells me that she heats w/ wood & space heaters... also using "mullin & eucalyptus" rec by a homeopath friend in [place]... hasn't been using her nebulizer, and only taking 600mg  Mucinex Bid... REC> we discussed Depo/ Pred, incr Mucinex w/ fluids, incr NEBS regularly, continue Advair500, Spiriva, etc;  check CXR (COPD, scarring, NAD); & Labs (/all=OK)...  ~  April 30, 2009:  she has mult somatic complaints and "lots of issues w/ breathing"... tired all the time & can't understand why... tells me she passed a kidney stone recently (no med attention)... chest much improved from last OV; on Pred 10mg /d now & wants to wean; seeing DrLorie (eating disorder specialist) to help her not use food & he rec applying for disability... PFT today shows severe airflow obstruction w/ FEV1= 1.51L (55%) & %1sec=59... she wants off Pred.  ~  Jul 15, 2010:  31mo ROV & she has seen TP over the past yr for acute exac- Rx Doxy/ Pred & improved;  She has received disability & will be elig for Medicare this fall she says;  Notes "breathing worse- my air  goes in but comes right back out";  O2 sat 77% on arrival but not using oxygen because "it doesn't help" I explained why it helps but she isn't convinced; she has motorized wheelchair but can't transport it; she mentioned alternative lifestyle introduced by her daughter (child she gave up for adoption as a teen)...  CXR & labs are stable> encouraged to stay on meds regularly (-she hasn't), use O2 regularly (-she didn't), use CPAP nightly (not using at all), get on diet, get wt down (-no change), increase exercise (-not able she says) & offered PulmRehab (she declined)...  ~  January 14, 2011:  27mo ROV & she notes her breathing is much worse and "something has to be done", notes that she is on Medicare now;  She is tearful- "I can't do anything, can't go anywhere, no friends, I sleep 15H a day" but she still has her pets & new doberman "[name]" rescue dog; she notes SOB w/ ADLs- worse over the last yr, very poorly conditioned & hasn't tried exercise program & hasn't lost weight (prev declined pulm rehab on mult occas); she has a motorized wheelchair but it is too big & won't fit in her house (uses it when she worked at [employer]), wants a rolling walker w/ seat so she can use it in the house & get from room to room; she saw TP 9/12 & she  arranged for [facility] to set up new O2 system but she says it's way too heavy & not really portable therefore doesn't use it much- needs better port system like she has seen on the internet; also [facility] didn't come to check/ service/ replace old CPAP unit but she really needs new sleep study first;  Also c/o CP- her same atypCP that she has had, sharp/ sore/ hurts to breathe or cough, & Aleve helps;  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 ~  11/12:  She has agreed to a new Sleep Study- split night protocol, to recheck her OSA & determine CPAP needs (check ABG if poss too)...  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 supposed to be on NEBULIZER w/ XOPENEX 1.25mg  regularly (hasn't used), ADVAIR 500Bid + SPIRIVA daily (she freq stops all meds) ==> she had a second opinion consult at [facility] by DrAdair in 2006... ~  A1AT level is normal 218 (83-200) 3/09... ~  baseline CXR w/ borderline Cor, COPD, interstitial scarring/ atelectasis/ obesity... ~  PFTs 3/07 showed FVC=2.74 (82%), FEV1=1.67 (62%), %1sec=61, mid-flows=27%pred... improved from 2005. ~  CTChest in 2007 & 4/09 showed severe centrilob emyphysema, + interstitial fibrosis, sm HH, left renal cyst... neg for PE... ~  2010:  breathing improved w/ weight reduction after Lap-band surg... ~  2/11: COPD exac w/ neg CXR (chr changes, NAD), Rx w/ Depo/ Pred/ Avelox/ Mucinex/ NEBS/ Advair/ Spiriva/ etc... ~  PFTs 3/11 showed FVC= 2.57 (73%), FEV1= 1.51 (55%), %1sec=59, mid-flows= 21%pred. ~  Noncompliant w/ Rx & O2 thru 2012... On disability since 2011 & finally got Medicare coverage 9/12... ~  11/12: presents w/ worsening breathing & dyspnea w/ ADLs ==> we outlined further eval w/ 2DEcho & Sleep Study; plus Rx w/ NEBS QID, Advair500Bid, Spiriva daily, Mucinex, Oxygen, Lasix, Zoloft, Xanax, etc...  ATYPICAL CHEST PAIN (ICD-786.50) - eval by DrWall in 2009. ~  baseline EKG w/ NSR, PVC's, NSSTTWA, NAD... ~  2DEcho 2/05 showed mild MR/ TR, norm LA/ RV, EF=50-55%... ~  NuclearStressTest 4/09 was norm- no ischemia, EF=55%... similiar to prev studies at [facility] 2005 & 2007...  VENOUS INSUFFICIENCY, CHRONIC (ICD-459.81) - Hx chr ven insuffic w/ edema... on LASIX 40mg - 1-2 daily (she self  medicates)...  METABOLIC SYNDROME X (ICD-277.7) - she is on diet alone (refused Metformin therapy)... ~  labs 9/08 showed BS=120... FBS 5/08 was 93, HgA1c=7.1 ~  labs 5/10 showed BS= 182, A1c= 6.4 ~  labs 2/11 showed BS= 76, A1c= 6.5 ~  Labs 5/12 showed BS= 103, A1c= 6.5  MORBID OBESITY (ICD-278.01) - she prev saw [name] for counselling about her eating disorder... ~  max weight ~340# before lap band surg 7/09 by DrMartin. ~  weight 12/09 = 291# ~  weight 5/10 = 265# ~  weight 11/10- = 254# ~  weight 2/11 = 260# ~  Weight 5/12= 290# ~  Weight 11/12 = 286#  HYPOTHYROIDISM (ICD-244.9) - off thyroid meds since 1/10... she was started on Synthroid and followed by DrRSmith in  [place] & last seen 7/09- note reviewed... she refuses f/u by [name]- we will recheck TSH off Synthroid Rx... ~  labs 5/10 off Synthroid since 1/10 showed TSH= 1.13 ~  labs 2/11 showed TSH= 1.82 ~  Labs 5/12 showed TSH= 1.81  GERD (ICD-530.81) - prev on Nexium but off all meds since 1/10... IRRITABLE BOWEL SYNDROME (ICD-564.1) - colonoscopy 1982 by DrPatterson was WNL.Marland KitchenMarland Kitchen  UTERINE CANCER, HX OF (ICD-V10.42) - s/p laparoscopic hysterectomy & BSO 7/07 by DrSkinner at [place]...   DEGENERATIVE JOINT DISEASE (ICD-715.90) - on VICODIN tid prn,  ANAPROX 220mg  Bid prn, ULTRAM 50mg  Prn... ? 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 she was on Wellbutrin & Vicodin when last seen in 2006... VITAMIN D DEFICIENCY (ICD-268.9) - Vit D level 5/10 = 16... rec> start OTC Vit D 2000 u daily.  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 she has seen psychiatry in the past (DrBarnett in [place]) and was Dx w/ seasonal affective disorder... prev seeing psychologist [name] for eating disorder counselling...   Past Surgical History  Procedure Date  . Splenectomy at  gae 8    d/t trauma  . Appendectomy   . Cholecystectomy 1982  . Laparoscopic hysterectomy 2007    forsythe  . Bilateral salpingoophorectomy 2007    forsythe  . Laparoscopic gastric banding 08/2007    Dr. Daphine Deutscher    Outpatient Encounter Prescriptions as of 01/14/2011  Medication Sig Dispense Refill  . albuterol (PROAIR HFA) 108 (90 BASE) MCG/ACT inhaler Inhale 2 puffs into the lungs every 6 (six) hours as needed.        Marland Kitchen albuterol (PROVENTIL) (2.5 MG/3ML) 0.083% nebulizer solution Take 3 mLs (2.5 mg total) by nebulization every 6 (six) hours as needed for wheezing.  60 vial  12  . ALPRAZolam (XANAX) 0.5 MG tablet Take 1/2 to 1 tablet by mouth 3 times daily as needed for nerves. Not to exceed 3 per day.  90 tablet  2  . cetirizine (ZYRTEC) 10 MG tablet Take 10 mg by mouth daily.        Marland Kitchen Dextromethorphan-Guaifenesin (MUCINEX DM MAXIMUM STRENGTH) 60-1200 MG per 12 hr tablet Take 1 tablet by mouth daily as needed.        . Fluticasone-Salmeterol (ADVAIR DISKUS) 500-50 MCG/DOSE AEPB Inhale 1 puff into the lungs 2 (two) times daily.        . furosemide (LASIX) 40 MG tablet Take 1 to 2 tabs by mouth once daily as needed for swelling  60 tablet  11  . HYDROcodone-acetaminophen (VICODIN) 5-500 MG per tablet Take 1 tablet by mouth 3 (three) times daily as needed.        . naproxen sodium (ANAPROX) 220 MG tablet Take 2 tabs by mouth once daily       . sertraline (ZOLOFT) 100 MG tablet 1 TABLET DAILY  30 tablet  2  . tiotropium (SPIRIVA) 18 MCG inhalation capsule Place 18 mcg into inhaler and inhale daily.        Marland Kitchen DISCONTD: levalbuterol (XOPENEX) 1.25 MG/3ML nebulizer solution Use in nebulizer 3 to 4 times daily         Allergies  Allergen Reactions  . Hydrocodone-Acetaminophen     REACTION: itching  . Penicillins     REACTION: nausea and vomiting  . Tramadol     REACTION: itching  Current Medications, Allergies, Past Medical History, Past Surgical History, Family History, and Social  History were reviewed in [employer] record.   Review of Systems         See HPI - all other systems neg except as noted... The patient complains of dyspnea on exertion and difficulty walking; chest discomfort, & leg weakness.  The patient denies anorexia, fever, weight loss, weight gain, vision loss, decreased hearing, hoarseness, syncope, prolonged cough, headaches, hemoptysis, abdominal pain, melena, hematochezia, severe indigestion/heartburn, hematuria, incontinence, suspicious skin lesions, transient blindness, depression, unusual weight change, abnormal bleeding, enlarged lymph nodes, and angioedema.    Objective:   Physical Exam    WD, Morbidly Obese, [name] in NAD... GENERAL:  Alert & oriented; pleasant & cooperative; she is despondent... HEENT:  [place]/AT, EOM-wnl, PERRLA, EACs-clear, TMs-wnl, NOSE-clear, THROAT-clear & wnl. NECK:  Supple w/ fairROM; no JVD; normal carotid impulses w/o bruits; no thyromegaly or nodules palpated; no lymphadenopathy. CHEST:  decr BS bilat; w/ silent chest- no wheezing, rales, or rhonchi... HEART:  Regular Rhythm; without murmurs/ rubs/ or gallops heard... ABDOMEN:  Obese, soft & nontender; normal bowel sounds; no organomegaly or masses detected. EXT: without deformities, mod arthritic changes; no varicose veins/ +venous insuffic/trace edema. NEURO:  CNs intact; no focal neuro deficits... DERM: few ecchymoses, no rash etc...   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   OSA>  As noted she needs new sleep study & appropriate new CPAP apparatus to facilitate compliance...  COPD/ Emphysema>  Encouraged to stay on her O2 regularly,  continue NEBS, Advair/ Spiriva regularly, use the Mucinex as needed; needs to lose wt & incr exercise- offered PulmRehab many times in the past 7 always turned [place] down...  Hx CP w/ neg cardiac evals including several Myoviews; we will check 2DEcho to screen for PulmHTN...  OBESITY>  Met syndrome, must get wt down etc; she is a lap band failure & prev counseling by [name]...  ? Hx Hypothy>  TFTs remain normal off meds- no problem...  GI>  GERD> she denies symptoms & uses OTC meds prn...  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  Dysthymia>  Much stress in her life, on Alpraz/ Zoloft- offered referral to Psychiatrist but she declines, has embraced alt lifestyle.Marland KitchenMarland Kitchen

## 2011-01-14 NOTE — Patient Instructions (Signed)
Today we updated your med list in our EPIC system...    Continue your current medications the same...  We will make arrangements for a different home care company to pick up your Oxygen & DME needs    You need portable oxygen at 2L/min rest & 3-4L/min with exercise...    You need a rolling walker w/ seat that meets your height requirements...  We will sched a 2D Echocardiogram ASAP> we can then discuss your Cardiac needs (further eval, meds, etc)...  We will arrange for a new SLEEP study (split night study if poss) to assess your CPAP needs...  Ultimately you will need to enter a PULMONARY REHAB PROGRAM to improve your exercise capacity etc..Marland Kitchen

## 2011-01-21 ENCOUNTER — Other Ambulatory Visit (HOSPITAL_COMMUNITY): Payer: Medicare Other | Admitting: Radiology

## 2011-01-21 ENCOUNTER — Encounter (HOSPITAL_BASED_OUTPATIENT_CLINIC_OR_DEPARTMENT_OTHER): Payer: Medicare Other

## 2011-02-03 ENCOUNTER — Ambulatory Visit (HOSPITAL_COMMUNITY): Payer: Medicare Other

## 2011-02-16 ENCOUNTER — Telehealth: Payer: Self-pay | Admitting: Pulmonary Disease

## 2011-02-16 NOTE — Telephone Encounter (Signed)
ATC x 2 line busy. WCB. Jennifer Castillo, CMA  

## 2011-02-17 NOTE — Telephone Encounter (Signed)
Pt's phone still busy. WCB.

## 2011-02-17 NOTE — Telephone Encounter (Signed)
ATC pt back but line was busy wcb

## 2011-02-17 NOTE — Telephone Encounter (Signed)
lmomtcb x1 

## 2011-02-17 NOTE — Telephone Encounter (Signed)
I am mailing patient assistance forms tot he pt for Advair and Spiriva. ATC pt x 2 and line is busy. Pt will need to fill out the forms and either mail or bring them to the office. WCB later.

## 2011-02-17 NOTE — Telephone Encounter (Signed)
Patient returning call.

## 2011-02-18 NOTE — Telephone Encounter (Signed)
lmomtcb  

## 2011-02-20 NOTE — Telephone Encounter (Signed)
ATC pt x2, line busy 

## 2011-02-23 NOTE — Telephone Encounter (Signed)
LM advising we have tried to contact pt multiple times and has not had a response from her yet. Pt advised to call back if something was still needed

## 2011-02-26 ENCOUNTER — Telehealth: Payer: Self-pay | Admitting: Pulmonary Disease

## 2011-02-26 NOTE — Telephone Encounter (Signed)
Have samples for the pt and will leave these up front for her to pick up.  Unable to get in contact with pt with number left.  Just gives me a busy signal.  Will try back later.

## 2011-02-26 NOTE — Telephone Encounter (Signed)
Unable to reach pt with number given--samples left up front for pt to pick up.  Will sign message.

## 2011-02-27 ENCOUNTER — Telehealth: Payer: Self-pay | Admitting: Pulmonary Disease

## 2011-03-04 NOTE — Telephone Encounter (Signed)
i have attempted to call the pt at number given but unable to contact pt with this number--line just keeps ringing busy.  Forms have been completed by SN.  Will try back later

## 2011-03-06 NOTE — Telephone Encounter (Signed)
Unable to contact pt at number given and i have looked through other phone notes and it is the same number.  Line rings fast busy.  Will sign off of this phone note and wait for pt  To contact us back.

## 2011-03-09 ENCOUNTER — Other Ambulatory Visit: Payer: Self-pay | Admitting: *Deleted

## 2011-03-09 MED ORDER — TIOTROPIUM BROMIDE MONOHYDRATE 18 MCG IN CAPS: 18.0000 ug | ORAL_CAPSULE | Freq: Every day | RESPIRATORY_TRACT | Status: DC

## 2011-03-18 ENCOUNTER — Telehealth: Payer: Self-pay | Admitting: Pulmonary Disease

## 2011-03-18 MED ORDER — TIOTROPIUM BROMIDE MONOHYDRATE 18 MCG IN CAPS: 18.0000 ug | ORAL_CAPSULE | Freq: Every day | RESPIRATORY_TRACT | Status: DC

## 2011-03-18 NOTE — Telephone Encounter (Signed)
Called and had to leave message for Kim-need to know if she did or did not get papers that [facility] faxed to her today.

## 2011-03-18 NOTE — Telephone Encounter (Signed)
Rebecca [name] states she received the application for med assistance for Spiriva and Advair, however, they do not do med assistance for the Advair. Also, Sn did not sign form for spiriva and a verbal was needed and given to [employer]. A prescription needs to be faxed to Rebecca [name]'s attention at [phone] so they can process this request. RX printed and placed on SN cart for signature.  Leigh do you still have paperwork for Advair? Rebecca [name] said they received Advair as well but do not process this. Please advise.

## 2011-03-18 NOTE — Telephone Encounter (Signed)
Papers were scanned into the pts chart.  rx has been signed by SN and faxed back.  thanks

## 2011-03-19 NOTE — Telephone Encounter (Signed)
Called and spoke with Selena Batten from the patient assistance program.  She stated she received the paperwork from [facility] and pt has been accepted into the assistance program effective from now until 03/01/2012 and she has filled pt's spiriva and mailed it out.   ATC pt to inform her of this.  Line busy x 3.  WCB

## 2011-03-20 NOTE — Telephone Encounter (Signed)
ATC pt X 1 and line is bust. WCB later.

## 2011-03-23 NOTE — Telephone Encounter (Signed)
ATCx3 line busy. wcb to inform pt that she was accepted into the assistance program for spiriva. Carron Curie, CMA

## 2011-03-24 NOTE — Telephone Encounter (Signed)
Left a detailed msg on machine to let the pt know she had been accepted into pt assistance program for Spiriva and that we will call her once her medication arrives at the office so she ay pick this up and to call only if she has any questions.

## 2011-04-09 ENCOUNTER — Telehealth: Payer: Self-pay | Admitting: Pulmonary Disease

## 2011-04-09 NOTE — Telephone Encounter (Signed)
I called # listed and was advised I had the wrong # by a female. I looked in pt chart and it shows the # listed above was the home #. No answer at the alternative # in the chart. Will try the alternative # later

## 2011-04-10 MED ORDER — TRAMADOL HCL 50 MG PO TABS: 50.0000 mg | ORAL_TABLET | Freq: Four times a day (QID) | ORAL | Status: DC | PRN

## 2011-04-10 NOTE — Telephone Encounter (Signed)
Pt returned call. I advised her that rx had been called in- also advised that if chest pains continues or becomes severe that per TP pt should go to ER. Pt says she understands and nothing further needed at this time. Rebecca [name]

## 2011-04-10 NOTE — Telephone Encounter (Signed)
Pt confirmed that her number IS [phone].  I confirmed this w/ caller ID.  Pt requests a returned phone call asap.  Pt is very upset that this has not been addressed yet.  Antionette Fairy

## 2011-04-10 NOTE — Telephone Encounter (Signed)
Patient calling back. She c/o chest pain off an on but worse at night when trying to lie down. She has had a slight prod cough x 1 week. She is requesting a refill for her Tramadol be called to [facility] [place] (pt at the pharmacy now). She is aware SN is out of the office and will need to speak with hi nurse practitioner for recs. TP, pls advise. Allergies  Allergen Reactions  . Hydrocodone-Acetaminophen     REACTION: itching  . Penicillins     REACTION: nausea and vomiting  . Tramadol     REACTION: itching

## 2011-04-10 NOTE — Telephone Encounter (Signed)
Per TP okay to fill Tramadol for patient. If pt is having CP taht continues or is severe, she will need to go to the ER per TP.   Tramadol 50mg  #30, 1 po every 6 hours prn for pain called to her pharmacy.    Asked pharmacist to have pt return triage call.  Will also try home # again later.

## 2011-04-10 NOTE — Telephone Encounter (Signed)
ATC alternative # in chart and NA wcb

## 2011-04-10 NOTE — Telephone Encounter (Signed)
ATC x2.  Line busy.  WCB. 

## 2011-04-16 ENCOUNTER — Telehealth: Payer: Self-pay | Admitting: Pulmonary Disease

## 2011-04-16 DIAGNOSIS — J449 Chronic obstructive pulmonary disease, unspecified: Secondary | ICD-10-CM

## 2011-04-16 NOTE — Telephone Encounter (Signed)
ATC pt line busy x 3 wcb 

## 2011-04-16 NOTE — Telephone Encounter (Signed)
Pt stated she needs an order to be sent to [facility] for her portable oxygen. She states this was suppose to be done back in November. I looked in pt chart and SN did want pt to be set for portable oxygen. i have placed order to [facility] Nothing further was needed

## 2011-04-21 ENCOUNTER — Telehealth: Payer: Self-pay | Admitting: Pulmonary Disease

## 2011-04-21 NOTE — Telephone Encounter (Signed)
Rebecca [name] called back and stated that pt insisted that [facility] pick o2 up in Nov bc Medicare would not cover it at 100%.  Pt did sign a AMA form regarding this.  Per lecretia, pt will need to come in for qualifying sats and this will need to be treated as a new o2 start.  I called pt's home # x 3 to schedule OV for this but line busy.  WCB.

## 2011-04-21 NOTE — Telephone Encounter (Signed)
LMTCB for Lecretia 

## 2011-04-21 NOTE — Telephone Encounter (Signed)
Lecretia call back. States they received order on Feb 14 to eval pt for a portable system but when they went out pt did not have any o2 at all.  She is unsure of this and requesting to call back with further clarification on what is needed.

## 2011-04-22 NOTE — Telephone Encounter (Signed)
I called pt's home # x 3 -- line busy.  WCB

## 2011-04-23 NOTE — Telephone Encounter (Signed)
ATC pt and her line is still busy, [facility]

## 2011-04-24 NOTE — Telephone Encounter (Signed)
ATC the pt and line still busy, [facility]

## 2011-04-27 NOTE — Telephone Encounter (Signed)
LMOMTCB x 1 

## 2011-04-28 NOTE — Telephone Encounter (Signed)
Line is busy, our office has attempted to contact patient numerous attempts. Will wait for pt to call back. Only have 1 number in pt's chart.Marland KitchenMarland Kitchen

## 2011-04-30 ENCOUNTER — Telehealth: Payer: Self-pay | Admitting: Pulmonary Disease

## 2011-04-30 NOTE — Telephone Encounter (Signed)
ATCx3 line busy. See phone note from 04/21/11 and it explains why pt needs to come in for OV for qualifying saturations. Carron Curie, CMA

## 2011-04-30 NOTE — Telephone Encounter (Signed)
Pt stated that she had testing done a year ago & does not understand why new testing is required. Pt questions who is making her medical decision, the doc, [facility] or Medicare.  Pt stated she does not know when she will be available & does not have an alternate number that she can be reached at.  Antionette Fairy

## 2011-05-01 NOTE — Telephone Encounter (Signed)
ATC x 3 - line busy.  WCB 

## 2011-05-01 NOTE — Telephone Encounter (Signed)
ATC the pt and line still busy

## 2011-05-04 ENCOUNTER — Ambulatory Visit (INDEPENDENT_AMBULATORY_CARE_PROVIDER_SITE_OTHER)
Admission: RE | Admit: 2011-05-04 | Discharge: 2011-05-04 | Disposition: A | Discharge status: Home / Self Care | Payer: Medicare Other | Source: Ambulatory Visit | Attending: Adult Health | Admitting: Adult Health

## 2011-05-04 ENCOUNTER — Ambulatory Visit (INDEPENDENT_AMBULATORY_CARE_PROVIDER_SITE_OTHER): Payer: Medicare Other | Admitting: Adult Health

## 2011-05-04 ENCOUNTER — Encounter: Payer: Self-pay | Admitting: Adult Health

## 2011-05-04 DIAGNOSIS — J449 Chronic obstructive pulmonary disease, unspecified: Secondary | ICD-10-CM

## 2011-05-04 DIAGNOSIS — M1990 Unspecified osteoarthritis, unspecified site: Secondary | ICD-10-CM | POA: Diagnosis not present

## 2011-05-04 DIAGNOSIS — R0602 Shortness of breath: Secondary | ICD-10-CM | POA: Diagnosis not present

## 2011-05-04 DIAGNOSIS — J45901 Unspecified asthma with (acute) exacerbation: Secondary | ICD-10-CM

## 2011-05-04 DIAGNOSIS — M549 Dorsalgia, unspecified: Secondary | ICD-10-CM | POA: Diagnosis not present

## 2011-05-04 DIAGNOSIS — J9819 Other pulmonary collapse: Secondary | ICD-10-CM | POA: Diagnosis not present

## 2011-05-04 MED ORDER — HYDROCODONE-ACETAMINOPHEN 5-500 MG PO TABS: 1.0000 | ORAL_TABLET | ORAL | Status: DC | PRN

## 2011-05-04 MED ORDER — CYCLOBENZAPRINE HCL 5 MG PO TABS: 5.0000 mg | ORAL_TABLET | Freq: Three times a day (TID) | ORAL | Status: DC | PRN

## 2011-05-04 NOTE — Telephone Encounter (Signed)
ATC again and line still busy. No other contact number in EPIC.  Will sign off and await call back. Carron Curie, CMA

## 2011-05-04 NOTE — Assessment & Plan Note (Signed)
Mild flare today in office ?secondary to pain  xopenex neb given in office with improvement.  Cont on current reigmen.  Cont on O2

## 2011-05-04 NOTE — Assessment & Plan Note (Signed)
Flare with upper back pain  No acute process on cxr today   Plan:  May use Aleve daily with food for back pain for 1 week and As needed   Flexeril 5mg  Three times a day  As needed  Muscle spasm  Vicodin 1 every 6 hr as needed for severe pain -may make you sleepy.  Alternate ice and heat.  Please contact office for sooner follow up if symptoms do not improve or worsen or seek emergency care  follow up Dr. [name]  In 1 month and As needed

## 2011-05-04 NOTE — Progress Notes (Signed)
Subjective:    Patient ID: Rebecca [name], female    DOB: 04/01/1950, 61 y.o.   MRN: [id number]  HPI  61  y/o WF   ~  January 21, 2009:  6 mo f/u & she is very happy w/ her results from LapBand surgery- states she's lost >100#... our scales show plateau ~250-260# & she hasn't been back to DrMMartin in >59yr due to the $350 charge per visit she says... as noted- she's stopped the O2 ("don't need it, I'm walking daily & doing well");  she called 9/10 wanting to restart Pred10mg  due to dyspnea but she is off this again & improved... she wanted to change the Zoloft to Wellbutrin but couldn't afford the latter med...  ~  April 11, 2009:  she had stopped all meds... saw TP 04/01/09 w/ COPD exac- Rx'd w/ Avelox, Mucinex, Advair, Spiriva> improved... then symptoms came roaring back x 1wk- congested, wheezing, cough, SOB, etc... she tells me that she heats w/ wood & space heaters... also using "mullin & eucalyptus" rec by a homeopath friend in [place]... hasn't been using her nebulizer, and only taking 600mg  Mucinex Bid... REC> we discussed Depo/ Pred, incr Mucinex w/ fluids, incr NEBS regularly, continue Advair500, Spiriva, etc;  check CXR (COPD, scarring, NAD); & Labs (/all=OK)...  ~  April 30, 2009:  she has mult somatic complaints and "lots of issues w/ breathing"... tired all the time & can't understand why... tells me she passed a kidney stone recently (no med attention)... chest much improved from last OV; on Pred 10mg /d now & wants to wean; seeing DrLorie (eating disorder specialist) to help her not use food & he rec applying for disability... PFT today shows severe airflow obstruction w/ FEV1= 1.51L (55%) & %1sec=59... she wants off Pred.  ~  Jul 15, 2010:  75mo ROV & she has seen TP over the past yr for acute exac- Rx Doxy/ Pred & improved;  She has received disability & will be elig for Medicare this fall she says;  Notes "breathing worse- my air goes in but comes right back out";  O2 sat 77%  on arrival but not using oxygen because "it doesn't help" I explained why it helps but she isn't convinced; she has motorized wheelchair but can't transport it; she mentioned alternative lifestyle introduced by her daughter (child she gave up for adoption as a teen)...  CXR & labs are stable> encouraged to stay on meds regularly (-she hasn't), use O2 regularly (-she didn't), use CPAP nightly (not using at all), get on diet, get wt down (-no change), increase exercise (-not able she says) & offered PulmRehab (she declined)...  ~  January 14, 2011:  33mo ROV & she notes her breathing is much worse and "something has to be done", notes that she is on Medicare now;  She is tearful- "I can't do anything, can't go anywhere, no friends, I sleep 15H a day" but she still has her pets & new doberman "[name]" rescue dog; she notes SOB w/ ADLs- worse over the last yr, very poorly conditioned & hasn't tried exercise program & hasn't lost weight (prev declined pulm rehab on mult occas); she has a motorized wheelchair but it is too big & won't fit in her house (uses it when she worked at [employer]), wants a rolling walker w/ seat so she can use it in the house & get from room to room; she saw TP 9/12 & she arranged for [facility] to set up new O2  system but she says it's way too heavy & not really portable therefore doesn't use it much- needs better port system like she has seen on the internet; also [facility] didn't come to check/ service/ replace old CPAP unit but she really needs new sleep study first;  Also c/o CP- her same atypCP that she has had, sharp/ sore/ hurts to breathe or cough, & Aleve helps;  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 States laying on her back helps. Complians of pain with inspiration, moving and very tender to touch mid back and under shoulder blades. No syncope or chest pain. No increased cough or wheezing. Has been using aleve and heat with some help. CXR today shows no acute process.  No hemopytsis , calf pain . Or edema.  Of note was set up for 2 D echo and sleep study last ov but declines going .           Problem List:  OBSTRUCTIVE SLEEP APNEA (ICD-327.23) - sleep study 2005 showed RDI=15 w/ desat to 78%, loud snoring & +leg jerks> on CPAP 10, prev used intermittently but now not at all... ~  11/12:  She has agreed to a new Sleep Study- split night protocol, to recheck her OSA & determine CPAP needs (check ABG if poss too)...  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 supposed to be on NEBULIZER w/ XOPENEX 1.25mg  regularly (hasn't used), ADVAIR 500Bid + SPIRIVA daily (she freq stops all meds) ==> she had a second opinion consult at [facility] by DrAdair in 2006... ~  A1AT level is normal 218 (83-200) 3/09... ~  baseline CXR w/ borderline Cor, COPD, interstitial scarring/ atelectasis/ obesity... ~  PFTs 3/07 showed FVC=2.74 (82%), FEV1=1.67 (62%), %1sec=61, mid-flows=27%pred... improved from 2005. ~  CTChest in 2007 & 4/09 showed severe centrilob emyphysema, + interstitial fibrosis, sm HH, left renal cyst... neg for PE... ~  2010:  breathing improved w/ weight reduction after Lap-band surg... ~  2/11: COPD exac w/ neg CXR (chr changes, NAD), Rx w/ Depo/ Pred/ Avelox/ Mucinex/ NEBS/ Advair/ Spiriva/ etc... ~  PFTs 3/11 showed FVC= 2.57 (73%), FEV1= 1.51 (55%), %1sec=59, mid-flows= 21%pred. ~  Noncompliant w/ Rx & O2 thru 2012... On disability since 2011 & finally got Medicare coverage 9/12... ~  11/12: presents w/ worsening breathing & dyspnea w/ ADLs ==> we outlined further eval w/ 2DEcho & Sleep Study; plus Rx w/ NEBS QID, Advair500Bid, Spiriva daily,  Mucinex, Oxygen, Lasix, Zoloft, Xanax, etc...  ATYPICAL CHEST PAIN (ICD-786.50) - eval by DrWall in 2009. ~  baseline EKG w/ NSR, PVC's, NSSTTWA, NAD... ~  2DEcho 2/05 showed mild MR/ TR, norm LA/ RV, EF=50-55%... ~  NuclearStressTest 4/09 was norm- no ischemia, EF=55%... similiar to prev studies at [facility] 2005 & 2007...  VENOUS INSUFFICIENCY, CHRONIC (ICD-459.81) - Hx chr ven insuffic w/ edema... on LASIX 40mg - 1-2 daily (she self medicates)...  METABOLIC SYNDROME X (ICD-277.7) - she is on diet alone (refused Metformin therapy)... ~  labs 9/08 showed BS=120... FBS 5/08 was 93, HgA1c=7.1 ~  labs 5/10 showed BS= 182, A1c= 6.4 ~  labs 2/11 showed BS= 76, A1c= 6.5 ~  Labs 5/12 showed BS= 103, A1c= 6.5  MORBID OBESITY (ICD-278.01) - she prev saw [name] for counselling about her eating disorder... ~  max weight ~340# before lap band surg 7/09 by DrMartin. ~  weight 12/09 = 291# ~  weight 5/10 = 265# ~  weight 11/10- = 254# ~  weight 2/11 = 260# ~  Weight 5/12= 290# ~  Weight 11/12 = 286#  HYPOTHYROIDISM (ICD-244.9) - off thyroid meds since 1/10... she was started on Synthroid and followed by DrRSmith in [place] & last seen 7/09- note reviewed... she refuses f/u by [name]- we will recheck TSH off Synthroid Rx... ~  labs 5/10 off Synthroid since 1/10 showed TSH= 1.13 ~  labs 2/11 showed TSH= 1.82 ~  Labs 5/12 showed TSH= 1.81  GERD (ICD-530.81) - prev on Nexium but off all meds since 1/10... IRRITABLE BOWEL SYNDROME (ICD-564.1) - colonoscopy 1982 by DrPatterson was WNL.Marland KitchenMarland Kitchen  UTERINE CANCER, HX OF (ICD-V10.42) - s/p laparoscopic hysterectomy & BSO 7/07 by DrSkinner at [place]...   DEGENERATIVE JOINT DISEASE (ICD-715.90) - on VICODIN tid prn,  ANAPROX 220mg  Bid prn, ULTRAM 50mg  Prn... ? 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 she was on Wellbutrin & Vicodin when last seen in 2006... VITAMIN D DEFICIENCY  (ICD-268.9) - Vit D level 5/10 = 16... rec> start OTC Vit D 2000 u daily.  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 she has seen psychiatry in the past (DrBarnett in [place]) and was Dx w/ seasonal affective disorder... prev seeing psychologist [name] for eating disorder counselling...   Past Surgical History  Procedure Date  . 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 Appendectomy   . Cholecystectomy 1982  . Laparoscopic hysterectomy 2007    [place]  . Bilateral salpingoophorectomy 2007    [place]  . Laparoscopic gastric banding 08/2007    Dr. Daphine Deutscher    Outpatient Encounter Prescriptions as of 05/04/2011  Medication Sig Dispense Refill  . albuterol (PROAIR HFA) 108 (90 BASE) MCG/ACT inhaler Inhale 2 puffs into the lungs every 6 (six) hours as needed.        Marland Kitchen albuterol (PROVENTIL) (2.5 MG/3ML) 0.083% nebulizer solution Take 3 mLs (2.5 mg total) by nebulization every 6 (six) hours as needed for wheezing.  60 vial  12  . ALPRAZolam (XANAX) 0.5 MG tablet Take 1/2 to 1 tablet by mouth 3 times daily as needed for nerves. Not to exceed 3 per day.  90 tablet  2  . cetirizine (ZYRTEC) 10 MG tablet Take 10 mg by mouth daily.        Marland Kitchen Dextromethorphan-Guaifenesin (MUCINEX DM MAXIMUM STRENGTH) 60-1200 MG per 12 hr tablet Take 1 tablet by mouth daily as needed.        . Fluticasone-Salmeterol (ADVAIR DISKUS) 500-50 MCG/DOSE AEPB Inhale 1 puff into the lungs 2 (two) times daily.        . furosemide (LASIX) 40 MG tablet Take 1 to 2 tabs by mouth once daily as needed for swelling  60 tablet  11  . HYDROcodone-acetaminophen (VICODIN) 5-500 MG per tablet Take 1 tablet by mouth 3 (three) times daily as needed.        . naproxen sodium (ANAPROX) 220 MG tablet Take 2 tabs by mouth once daily       . sertraline (ZOLOFT) 100 MG tablet 1  TABLET DAILY  30 tablet  2  . tiotropium (SPIRIVA) 18 MCG inhalation capsule  Place 1 capsule (18 mcg total) into inhaler and inhale daily.  90 capsule  3  . traMADol (ULTRAM) 50 MG tablet Take 1 tablet (50 mg total) by mouth every 6 (six) hours as needed for pain.  30 tablet  1    Allergies  Allergen Reactions  . Hydrocodone-Acetaminophen     REACTION: itching  . Penicillins     REACTION: nausea and vomiting  . Tramadol     REACTION: itching    Current Medications, Allergies, Past Medical History, Past Surgical History, Family History, and Social History were reviewed in [employer] record.   Review of Systems         Constitutional:   No  weight loss, night sweats,  Fevers, chills,  +fatigue, or  lassitude.  HEENT:   No headaches,  Difficulty swallowing,  Tooth/dental problems, or  Sore throat,                No sneezing, itching, ear ache, nasal congestion, post nasal drip,   CV:  No chest pain,  Orthopnea, PND, swelling in lower extremities, anasarca, dizziness, palpitations, syncope.   GI  No heartburn, indigestion, abdominal pain, nausea, vomiting, diarrhea, change in bowel habits, loss of appetite, bloody stools.   Resp:   No coughing up of blood.  No change in color of mucus.    No chest wall deformity  Skin: no rash or lesions.  GU: no dysuria, change in color of urine, no urgency or frequency.  No flank pain, no hematuria   MS:  No joint   swelling.    Psych:     No memory loss.       Objective:   Physical Exam    WD, Morbidly Obese, 61 y/o WF  -appears in pain  GENERAL:  Alert & oriented;  HEENT:  [place]/AT, EOM-wnl, PERRLA, EACs-clear, TMs-wnl, NOSE-clear, THROAT-clear & wnl. NECK:  Supple w/ fairROM; no JVD; normal carotid impulses w/o bruits; no thyromegaly or nodules palpated; no lymphadenopathy. CHEST:  decr BS bilat in bases no wheezing, rales, or rhonchi... HEART:  Regular Rhythm; without murmurs/ rubs/ or gallops heard... ABDOMEN:  Obese, soft & nontender; normal bowel sounds; no organomegaly or masses  detected., no bruits noted  EXT: without deformities, mod arthritic changes; no varicose veins/ +venous insuffic/trace edema. Tender along mid upper back , no deformity noted.  NEURO:  ; no focal neuro deficits... DERM: no rash etc...   Assessment & Plan:

## 2011-05-04 NOTE — Patient Instructions (Signed)
May use Aleve daily with food for back pain for 1 week and As needed   Flexeril 5mg  Three times a day  As needed  Muscle spasm  Vicodin 1 every 6 hr as needed for severe pain -may make you sleepy.  Alternate ice and heat.  Please contact office for sooner follow up if symptoms do not improve or worsen or seek emergency care  follow up Dr. [name]  In 1 month and As needed

## 2011-05-05 NOTE — Progress Notes (Signed)
Addended by: Boone Master E on: 05/05/2011 09:39 AM   Modules accepted: Orders

## 2011-05-07 ENCOUNTER — Telehealth: Payer: Self-pay | Admitting: Pulmonary Disease

## 2011-05-07 MED ORDER — FUROSEMIDE 40 MG PO TABS: ORAL_TABLET | ORAL | Status: DC

## 2011-05-07 NOTE — Telephone Encounter (Signed)
Pt was last seen 3/4 by TP pt chart shows she is on advair 500/50

## 2011-05-07 NOTE — Telephone Encounter (Signed)
Pt line busy x 3  sent refill of the advair 500/50 to pharmacy

## 2011-05-14 ENCOUNTER — Telehealth: Payer: Self-pay | Admitting: Pulmonary Disease

## 2011-05-14 NOTE — Telephone Encounter (Signed)
ATC pt at # above.  Line busy x 3.  WCB

## 2011-05-15 NOTE — Telephone Encounter (Signed)
ATC and line was still busy, WCB. 

## 2011-05-15 NOTE — Telephone Encounter (Signed)
ATC the pt again and her line is still busy- wcb

## 2011-05-18 NOTE — Telephone Encounter (Signed)
ATC and line still busy.  We have attempted to contact pt multiple times.  Will wait for pt to call us back.

## 2011-05-18 NOTE — Telephone Encounter (Signed)
ATC x 1. Line is still busy. WCB later.

## 2011-05-21 ENCOUNTER — Telehealth: Payer: Self-pay

## 2011-05-21 DIAGNOSIS — J449 Chronic obstructive pulmonary disease, unspecified: Secondary | ICD-10-CM

## 2011-05-21 MED ORDER — FLUTICASONE-SALMETEROL 500-50 MCG/DOSE IN AEPB: 1.0000 | INHALATION_SPRAY | Freq: Two times a day (BID) | RESPIRATORY_TRACT | Status: DC

## 2011-05-21 NOTE — Telephone Encounter (Signed)
Called and spoke with pt.  Pt states [facility] evaluated her for portable o2 source and pt states she needs an order sent to [facility] for portable liquid o2 tanks.  States these are small/light enough for her to use.  Also, pt states she has been approved for the [employer] patient assistance program for her Advair.  States she needs a 90 day rx faxed to [facility] to access with a fax cover sheet and her id on it as well.  rx printed and put on SN's cart for him to sign.  Also please advise on order for liquid o2.  Thanks!

## 2011-05-22 NOTE — Telephone Encounter (Signed)
Per SN: okay for portable liquid o2 order.  rx signed, okay to fax.  Thanks.  Signed rx faxed to [phone] with pt's ID # [id number] written at the top.  Order placed.  ATC pt x2 to inform her of this, but line busy x2.

## 2011-05-25 NOTE — Telephone Encounter (Signed)
Unable to contact pt with the number she has listed in this phone note.  The line keeps ringing busy.  Will sign off of this message and wait for pt to call back.

## 2011-05-28 ENCOUNTER — Other Ambulatory Visit: Payer: Self-pay | Admitting: Adult Health

## 2011-06-04 ENCOUNTER — Telehealth: Payer: Self-pay | Admitting: Pulmonary Disease

## 2011-06-04 NOTE — Telephone Encounter (Signed)
SN- is this ok to refill? She had # 30 tablets filled on 05/04/11 and had 1 refill. Has ov with SN pending for 06/08/11. Please advise, thanks!

## 2011-06-05 MED ORDER — CYCLOBENZAPRINE HCL 5 MG PO TABS: 5.0000 mg | ORAL_TABLET | Freq: Three times a day (TID) | ORAL | Status: DC | PRN

## 2011-06-05 NOTE — Telephone Encounter (Signed)
Per SN---ok to fill the flexeril #90   1 po tid prn muscle spasms and give 5 refills. thanks

## 2011-06-05 NOTE — Telephone Encounter (Signed)
Informed patient rx has been seen to pharmacy.

## 2011-06-05 NOTE — Telephone Encounter (Signed)
lmomtcb x1--rx has been sent 

## 2011-06-08 ENCOUNTER — Encounter: Payer: Self-pay | Admitting: Pulmonary Disease

## 2011-06-08 ENCOUNTER — Ambulatory Visit (INDEPENDENT_AMBULATORY_CARE_PROVIDER_SITE_OTHER): Payer: Medicare Other | Admitting: Pulmonary Disease

## 2011-06-08 VITALS — BP 112/88 | HR 98 | Temp 98.2°F | Ht 66.0 in | Wt 280.4 lb

## 2011-06-08 DIAGNOSIS — J449 Chronic obstructive pulmonary disease, unspecified: Secondary | ICD-10-CM | POA: Diagnosis not present

## 2011-06-08 DIAGNOSIS — M1990 Unspecified osteoarthritis, unspecified site: Secondary | ICD-10-CM

## 2011-06-08 DIAGNOSIS — F341 Dysthymic disorder: Secondary | ICD-10-CM

## 2011-06-08 DIAGNOSIS — E8881 Metabolic syndrome: Secondary | ICD-10-CM

## 2011-06-08 DIAGNOSIS — K219 Gastro-esophageal reflux disease without esophagitis: Secondary | ICD-10-CM

## 2011-06-08 DIAGNOSIS — E559 Vitamin D deficiency, unspecified: Secondary | ICD-10-CM

## 2011-06-08 DIAGNOSIS — G4733 Obstructive sleep apnea (adult) (pediatric): Secondary | ICD-10-CM

## 2011-06-08 DIAGNOSIS — Z8542 Personal history of malignant neoplasm of other parts of uterus: Secondary | ICD-10-CM

## 2011-06-08 DIAGNOSIS — K589 Irritable bowel syndrome without diarrhea: Secondary | ICD-10-CM

## 2011-06-08 DIAGNOSIS — J4489 Other specified chronic obstructive pulmonary disease: Secondary | ICD-10-CM

## 2011-06-08 MED ORDER — ALPRAZOLAM 0.5 MG PO TABS: ORAL_TABLET | ORAL | Status: DC

## 2011-06-08 MED ORDER — HYDROCODONE-ACETAMINOPHEN 5-500 MG PO TABS: 1.0000 | ORAL_TABLET | Freq: Three times a day (TID) | ORAL | Status: DC | PRN

## 2011-06-08 MED ORDER — CYCLOBENZAPRINE HCL 5 MG PO TABS: 5.0000 mg | ORAL_TABLET | Freq: Three times a day (TID) | ORAL | Status: AC | PRN

## 2011-06-08 NOTE — Patient Instructions (Signed)
Today we updated your med list in our EPIC system...    Continue your current medications the same...    We refilled your Flexeril & Vicodin as requested...  We will arrange for ENT consult to check your nasal passages...    ...and we will arrange an Endocrine consult per your request...  The most important thing for you to do is lose weight & gradually increase your exercise program (Pulm Rehab)...  Call for any questions...  Let's plan a follow up visit in 6 months.Marland KitchenMarland Kitchen

## 2011-06-08 NOTE — Progress Notes (Addendum)
Subjective:    Patient ID: Rebecca [name], female    DOB: 06-25-1950, 61 y.o.   MRN: [id number]  HPI 61 y/o WF known to me w/ mult med problems as noted below...  ~  January 21, 2009:  6 mo f/u & she is very happy w/ her results from LapBand surgery- states she's lost >100#... our scales show plateau ~250-260# & she hasn't been back to DrMMartin in >71yr due to the $350 charge per visit she says... as noted- she's stopped the O2 ("don't need it, I'm walking daily & doing well");  she called 9/10 wanting to restart Pred10mg  due to dyspnea but she is off this again & improved... she wanted to change the Zoloft to Wellbutrin but couldn't afford the latter med...  ~  April 11, 2009:  she had stopped all meds... saw TP 04/01/09 w/ COPD exac- Rx'd w/ Avelox, Mucinex, Advair, Spiriva> improved... then symptoms came roaring back x 1wk- congested, wheezing, cough, SOB, etc... she tells me that she heats w/ wood & space heaters... also using "mullin & eucalyptus" rec by a homeopath friend in [place]... hasn't been using her nebulizer, and only taking 600mg  Mucinex Bid... REC> we discussed Depo/ Pred, incr Mucinex w/ fluids, incr NEBS regularly, continue Advair500, Spiriva, etc;  check CXR (COPD, scarring, NAD); & Labs (/all=OK)...  ~  April 30, 2009:  she has mult somatic complaints and "lots of issues w/ breathing"... tired all the time & can't understand why... tells me she passed a kidney stone recently (no med attention)... chest much improved from last OV; on Pred 10mg /d now & wants to wean; seeing DrLorie (eating disorder specialist) to help her not use food & he rec applying for disability... PFT today shows severe airflow obstruction w/ FEV1= 1.51L (55%) & %1sec=59... she wants off Pred.  ~  Jul 15, 2010:  65mo ROV & she has seen TP over the past yr for acute exac- Rx Doxy/ Pred & improved;  She has received disability & will be elig for Medicare this fall she says;  Notes "breathing worse- my air  goes in but comes right back out";  O2 sat 77% on arrival but not using oxygen because "it doesn't help" I explained why it helps but she isn't convinced; she has motorized wheelchair but can't transport it; she mentioned alternative lifestyle introduced by her daughter (child she gave up for adoption as a teen)...  CXR & labs are stable> encouraged to stay on meds regularly (-she hasn't), use O2 regularly (-she didn't), use CPAP nightly (-not using at all), get on diet, get wt down (-no change), increase exercise (-not able she says) & offered PulmRehab (-she declined)...  ~  January 14, 2011:  79mo ROV & she notes her breathing is much worse and "something has to be done", notes that she is on Medicare now;  She is tearful- "I can't do anything, can't go anywhere, no friends, I sleep 15H a day" but she still has her pets & new doberman "[name]" rescue dog; she notes SOB w/ ADLs- worse over the last yr, very poorly conditioned & hasn't tried exercise program & hasn't lost weight (prev declined pulm rehab on mult occas); she has a motorized wheelchair but it is too big & won't fit in her house (uses it when she worked at [employer]), wants a rolling walker w/ seat so she can use it in the house & get from room to room; she saw TP 9/12 & she arranged  for [facility] to set up new O2 system but she says it's way too heavy & not really portable therefore doesn't use it much- needs better port system like she has seen on the internet; also [facility] didn't come to check/ service/ replace old CPAP unit but she really needs new sleep study first;  Also c/o CP- her same atypCP that she has had, sharp/ sore/ hurts to breathe or cough, & Aleve helps;  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 which would be multifactorial from her COPD/Emphysema, OSA, Obesity;  SEE PREV STUDIES BELOW> we discussed:  2DEcho,  split night sleep study,  Cards consult w/ DrBensimhon,  PulmRehab program somewhere (she prefers [place] or [place])  ~  June 08, 2011:  88mo ROV & she did not pursue any of the diagnostic or therapeutic interventions we mentioned last OV (she refused 2DEcho, split night sleep study, formal pulm rehab program- "I can't afford the 20% copay"); her CC today is that she has a hard time breathing thru her nose (prob dev septum) & she wants ENT referral- OK;  She has Home O2, Advair500- Bid, Spiriva, Mucinex- 2Bid w/ Fluids, ProairHFA rescue inhaler;  She has the Helios liq O2 system but doesn't like this either- ?it "cakes" the phlegm in her nose?  She has Lasix40mg  taking 1-2 daily but only using as needed for swelling she says;  She has Vicodin, Anaprox, Ultram, Flexeril for various pains and muscle spasm (she says she gets severe musc spasms every time she uses the Flutter valve to aide in mucocilliary clearance);  She notes that the Flexeril 5mg  Tid is "really great- it helps me breathe better");  She has Zoloft 100mg /d for depression and Alprazolam 0.5mg  Tid prn anxiety...  I have reiterated the need for sleep study/ CPAP, 2DEcho to eval for PulmHTN, and formal PulmRehab program as one of the most important interventions that will help her breathing etc;  She notes that she has to stop when ambulating from the bathroom to the kitchen, I feel pulm rehab is critically important to her, she declines & will not consider this intervention...  Today she wants ENT referral for her nose & wants another Endocrine referral for her Thyroid (recall prev insistence that she had thyroid dysfunction despite all normal TFTs & finally got an Endocrine in [place] ([name]) to prescribe thyroid hormone for her at her request)... Review of EPIC> last labs were 5/12 & these need updating... CXR 3/13 showed borderline heart size, sl incr interstitial markings & left basilar Atx, lungs otherw clear, NAD, degen changes in TSpine...          Problem List:  OBSTRUCTIVE SLEEP APNEA (ICD-327.23) - sleep study 2005 showed RDI=15 w/ desat to 78%, loud snoring & +leg  jerks> on CPAP 10, prev used intermittently but now not at all... ~  11/12:  She has agreed to a new Sleep Study- split night protocol, to recheck her OSA & determine CPAP needs (check ABG if poss too)...  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 supposed to be on NEBULIZER w/ XOPENEX 1.25mg  regularly (hasn't used), ADVAIR 500Bid + SPIRIVA daily (she freq stops all meds) ==> she had a second opinion consult at [facility] by DrAdair in 2006... ~  A1AT level is normal 218 (83-200) 3/09... ~  baseline CXR w/ borderline Cor, COPD, interstitial scarring/ atelectasis/ obesity... ~  PFTs 3/07 showed FVC=2.74 (82%), FEV1=1.67 (62%), %1sec=61, mid-flows=27%pred... improved from 2005. ~  CTChest in 2007 &  4/09 showed severe centrilob emyphysema, + interstitial fibrosis, sm HH, left renal cyst... neg for PE... ~  2010:  breathing improved w/ weight reduction after Lap-band surg... ~  2/11: COPD exac w/ neg CXR (chr changes, NAD), Rx w/ Depo/ Pred/ Avelox/ Mucinex/ NEBS/ Advair/ Spiriva/ etc... ~  PFTs 3/11 showed FVC= 2.57 (73%), FEV1= 1.51 (55%), %1sec=59, mid-flows= 21%pred. ~  Noncompliant w/ Rx & O2 thru 2012... On disability since 2011 & finally got Medicare coverage 9/12... ~  11/12: presents w/ worsening breathing & dyspnea w/ ADLs ==> we outlined further eval w/ 2DEcho & Sleep Study; plus Rx w/ NEBS QID, Advair500Bid, Spiriva daily, Mucinex, Oxygen, Lasix, Zoloft, Xanax, etc...  ATYPICAL CHEST PAIN (ICD-786.50) - eval by DrWall in 2009. ~  baseline EKG w/ NSR, PVC's, NSSTTWA, NAD... ~  2DEcho 2/05 showed mild MR/ TR, norm LA/ RV, EF=50-55%... ~  NuclearStressTest 4/09 was norm- no ischemia, EF=55%... similiar to prev studies at [facility] 2005 & 2007...  VENOUS INSUFFICIENCY, CHRONIC (ICD-459.81) - Hx chr ven insuffic w/ edema... on LASIX 40mg - 1-2 daily (she self medicates)...  METABOLIC SYNDROME X (ICD-277.7) - she is on diet alone (refused Metformin therapy)... ~  labs 9/08  showed BS=120... FBS 5/08 was 93, HgA1c=7.1 ~  labs 5/10 showed BS= 182, A1c= 6.4 ~  labs 2/11 showed BS= 76, A1c= 6.5 ~  Labs 5/12 showed BS= 103, A1c= 6.5  MORBID OBESITY (ICD-278.01) - she prev saw [name] for counselling about her eating disorder... ~  max weight ~340# before lap band surg 7/09 by DrMartin. ~  weight 12/09 = 291# ~  weight 5/10 = 265# ~  weight 11/10- = 254# ~  weight 2/11 = 260# ~  Weight 5/12= 290# ~  Weight 11/12 = 286# ~  Weight 4/13 = 280# ==> BMI=45  ? Hx HYPOTHYROIDISM (ICD-244.9) - off thyroid meds since 1/10... she was started on Synthroid and followed by DrRSmith in [place] & last seen 7/09- note reviewed... she refuses f/u by [name]- we will recheck TSH off Synthroid Rx... ~  labs 5/10 off Synthroid since 1/10 showed TSH= 1.13 ~  labs 2/11 showed TSH= 1.82 ~  Labs 5/12 showed TSH= 1.81  GERD (ICD-530.81) - prev on Nexium but off all meds since 1/10... IRRITABLE BOWEL SYNDROME (ICD-564.1) - colonoscopy 1982 by DrPatterson was WNL.Marland KitchenMarland Kitchen  ?KIDNEY STONE Hx >> pt said she passed a kidney stone 3/11 w/o any medical attention, w/o work up or proof of same; entry made here for future reference if needed.  UTERINE CANCER, HX OF (ICD-V10.42) - s/p laparoscopic hysterectomy & BSO 7/07 by DrSkinner at [facility]...   DEGENERATIVE JOINT DISEASE (ICD-715.90) - on VICODIN tid prn,  ANAPROX 220mg  Bid prn, ULTRAM 50mg  Prn... ? 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 she was on Wellbutrin & Vicodin when last seen in 2006... VITAMIN D DEFICIENCY (ICD-268.9) - Vit D level 5/10 = 16... rec> start OTC Vit D 2000 u daily.  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 she has seen psychiatry in the past (DrBarnett in [place]) and was Dx w/ seasonal affective disorder... prev seeing psychologist [name] for eating  disorder counselling... ~  4/13: she mentioned seeing [name] again for eating disorder...   Past Surgical History  Procedure Date  . 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 Appendectomy   .  Cholecystectomy 1982  . Laparoscopic hysterectomy 2007    forsythe  . Bilateral salpingoophorectomy 2007    forsythe  . Laparoscopic gastric banding 08/2007    Dr. Daphine Deutscher    Outpatient Encounter Prescriptions as of 06/08/2011  Medication Sig Dispense Refill  . albuterol (PROAIR HFA) 108 (90 BASE) MCG/ACT inhaler Inhale 2 puffs into the lungs every 6 (six) hours as needed.        Marland Kitchen albuterol (PROVENTIL) (2.5 MG/3ML) 0.083% nebulizer solution Take 3 mLs (2.5 mg total) by nebulization every 6 (six) hours as needed for wheezing.  60 vial  12  . ALPRAZolam (XANAX) 0.5 MG tablet Take 1/2 to 1 tablet by mouth 3 times daily as needed for nerves. Not to exceed 3 per day.  90 tablet  2  . cetirizine (ZYRTEC) 10 MG tablet Take 10 mg by mouth daily.        . cyclobenzaprine (FLEXERIL) 5 MG tablet Take 1 tablet (5 mg total) by mouth 3 (three) times daily as needed for muscle spasms.  90 tablet  5  . Dextromethorphan-Guaifenesin (MUCINEX DM MAXIMUM STRENGTH) 60-1200 MG per 12 hr tablet Take 1 tablet by mouth daily as needed.        . Fluticasone-Salmeterol (ADVAIR DISKUS) 500-50 MCG/DOSE AEPB Inhale 1 puff into the lungs 2 (two) times daily.  180 each  3  . furosemide (LASIX) 40 MG tablet Take 1 to 2 tabs by mouth once daily as needed for swelling  60 tablet  11  . HYDROcodone-acetaminophen (VICODIN) 5-500 MG per tablet Take 1 tablet by mouth every 4 (four) hours as needed.  30 tablet  0  . naproxen sodium (ANAPROX) 220 MG tablet Take 2 tabs by mouth once daily       . sertraline (ZOLOFT) 100 MG tablet 1 TABLET DAILY  30 tablet  2  . tiotropium (SPIRIVA) 18 MCG inhalation capsule Place 1 capsule (18 mcg total) into inhaler and inhale daily.  90 capsule  3  . traMADol (ULTRAM) 50 MG tablet Take 1 tablet (50 mg  total) by mouth every 6 (six) hours as needed for pain.  30 tablet  1    Allergies  Allergen Reactions  . Penicillins     REACTION: nausea and vomiting    Current Medications, Allergies, Past Medical History, Past Surgical History, Family History, and Social History were reviewed in [employer] record.   Review of Systems         See HPI - all other systems neg except as noted... The patient complains of dyspnea on exertion and difficulty walking; chest discomfort, & leg weakness.  The patient denies anorexia, fever, weight loss, weight gain, vision loss, decreased hearing, hoarseness, syncope, prolonged cough, headaches, hemoptysis, abdominal pain, melena, hematochezia, severe indigestion/heartburn, hematuria, incontinence, suspicious skin lesions, transient blindness, depression, unusual weight change, abnormal bleeding, enlarged lymph nodes, and angioedema.    Objective:   Physical Exam    WD, Morbidly Obese, 61 y/o WF in NAD... GENERAL:  Alert & oriented; pleasant & cooperative; she is despondent... HEENT:  [place]/AT, EOM-wnl, PERRLA, EACs-clear, TMs-wnl, NOSE-clear, THROAT-clear & wnl. NECK:  Supple w/ fairROM; no JVD; normal carotid impulses w/o bruits; no thyromegaly or nodules palpated; no lymphadenopathy. CHEST:  decr BS bilat; w/ silent chest- no wheezing, rales, or rhonchi... HEART:  Regular Rhythm; without murmurs/ rubs/ or gallops heard... ABDOMEN:  Obese, soft & nontender; normal bowel sounds; no organomegaly or masses detected. EXT: without deformities, mod arthritic changes; no varicose  veins/ +venous insuffic/trace edema. NEURO:  CNs intact; no focal neuro deficits... DERM: few ecchymoses, no rash etc...  RADIOLOGY DATA:  Reviewed in the EPIC EMR & discussed w/ the patient...  LABORATORY DATA:  Reviewed in the EPIC EMR & discussed w/ the patient...   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, OSA (largely untreated), morbid obesity, hypoxemia, etc;  We outlined the need for further eval w/ 2DEcho & repeat sleep study (split night study to expedite rx) but she has repeatedly declined these studies, refuses to consider them again today;  I reiterated the serious nature of her respiratory problem & need for these diagnostic tests but she refuses;  I asked her to participate in Pulm rehab program & described the huge benefits she is likely to have thru this program but she refuses this as well;  I offered second & third opinions from colleagues or from the med centers- she refuses this too> we are at an impasse & I am at a loss to know what to do to help her...  OSA>  As noted she needs new sleep study & appropriate new CPAP apparatus to facilitate compliance...  COPD/ Emphysema>  Encouraged to stay on her O2 regularly, continue NEBS, Advair/ Spiriva regularly, use the Mucinex as needed; needs to lose wt & incr exercise- offered PulmRehab many times in the past & always turned [place] down...  Hx CP w/ neg cardiac evals including several Myoviews; we will check 2DEcho to screen for PulmHTN...  OBESITY>  She had remote lap band surg by DrMMartin; Met syndrome, must get wt down etc; she is a lap band failure & prev counseling by [name]...  ? Hx Hypothy>  TFTs remain normal off meds- no problem...  GI>  GERD> she denies symptoms & uses OTC meds prn...  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  Dysthymia>  Much stress in her life, on Alpraz/ Zoloft- offered referral to Psychiatrist but she declines, has embraced alt lifestyle.Marland KitchenMarland Kitchen

## 2011-06-15 DIAGNOSIS — G4733 Obstructive sleep apnea (adult) (pediatric): Secondary | ICD-10-CM | POA: Diagnosis not present

## 2011-06-26 ENCOUNTER — Other Ambulatory Visit (INDEPENDENT_AMBULATORY_CARE_PROVIDER_SITE_OTHER): Payer: Medicare Other

## 2011-06-26 ENCOUNTER — Encounter: Payer: Self-pay | Admitting: Endocrinology

## 2011-06-26 ENCOUNTER — Ambulatory Visit (INDEPENDENT_AMBULATORY_CARE_PROVIDER_SITE_OTHER): Payer: Medicare Other | Admitting: Endocrinology

## 2011-06-26 VITALS — BP 124/82 | HR 90 | Temp 97.4°F | Ht 66.0 in | Wt 289.0 lb

## 2011-06-26 DIAGNOSIS — E119 Type 2 diabetes mellitus without complications: Secondary | ICD-10-CM

## 2011-06-26 DIAGNOSIS — E039 Hypothyroidism, unspecified: Secondary | ICD-10-CM

## 2011-06-26 LAB — T4, FREE: Free T4: 0.9 ng/dL (ref 0.60–1.60)

## 2011-06-26 LAB — HEMOGLOBIN A1C: Hgb A1c MFr Bld: 7.1 % — ABNORMAL HIGH (ref 4.6–6.5)

## 2011-06-26 NOTE — Progress Notes (Signed)
Subjective:    Patient ID: Rebecca [name], female    DOB: 11-28-1950, 61 y.o.   MRN: [id number]  HPI Pt was dx'ed with type 2 dm in 2008, when she had a1c of 7.1.  she is unaware of any chronic complications.  she has never been on medication for DM.  She has gastric banding 4 years ago.  Since then, she has lost 104 lbs, and has since regained 50.  Pt says her diet is poor despite gastric band, and exercise is limited by med probs.   Pt states few years of moderate sob in the chest, worst in the context of walking, but no assoc loc. She took levothyroxine as recently as 2009, but not since.   Past Medical History  Diagnosis Date  . OSA (obstructive sleep apnea)   . COPD (chronic obstructive pulmonary disease)   . Chest pain   . Chronic venous insufficiency   . Metabolic syndrome X   . Morbid obesity   . Hypothyroid   . GERD (gastroesophageal reflux disease)   . IBS (irritable bowel syndrome)   . History of uterine cancer   . DJD (degenerative joint disease)   . Fibromyalgia   . Vitamin d deficiency   . History of headache   . Dysthymia     Past Surgical History  Procedure Date  . 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 Appendectomy   . Cholecystectomy 1982  . Laparoscopic hysterectomy 2007    forsythe  . Bilateral salpingoophorectomy 2007    forsythe  . Laparoscopic gastric banding 08/2007    Dr. Daphine Deutscher    History   Social History  . Marital Status: Single    Spouse Name: N/A    Number of Children: 1  . Years of Education: N/A   Occupational History  . Not on file.   Social History Main Topics  . Smoking status: Former Smoker    Types: Cigarettes    Quit date: 03/03/1991  . Smokeless tobacco: Never Used  . Alcohol Use: No  . Drug Use: No  . Sexually Active: Not on file   Other Topics Concern  . Not on file   Social History Narrative   Does not exercise3 cups of coffee a daySingle- one child whom she gave up for adoption at birthAnd re-established  contact in 2009 w/ new relationship and 2 grand daughters.     Current Outpatient Prescriptions on File Prior to Visit  Medication Sig Dispense Refill  . albuterol (PROAIR HFA) 108 (90 BASE) MCG/ACT inhaler Inhale 2 puffs into the lungs every 6 (six) hours as needed.        . ALPRAZolam (XANAX) 0.5 MG tablet Take 1/2 to 1 tablet by mouth 3 times daily as needed for nerves. Not to exceed 3 per day.  90 tablet  2  . cetirizine (ZYRTEC) 10 MG tablet Take 10 mg by mouth daily.        . cyclobenzaprine (FLEXERIL) 5 MG tablet Take 1 tablet (5 mg total) by mouth 3 (three) times daily as needed for muscle spasms.  90 tablet  5  . Dextromethorphan-Guaifenesin (MUCINEX DM MAXIMUM STRENGTH) 60-1200 MG per 12 hr tablet Take 1 tablet by mouth daily as needed.        . Fluticasone-Salmeterol (ADVAIR DISKUS) 500-50 MCG/DOSE AEPB Inhale 1 puff into the lungs 2 (two) times daily.  180 each  3  . furosemide (LASIX) 40 MG tablet Take 1 to 2 tabs  by mouth once daily as needed for swelling  60 tablet  11  . HYDROcodone-acetaminophen (VICODIN) 5-500 MG per tablet Take 1 tablet by mouth 3 (three) times daily as needed for pain.  90 tablet  5  . naproxen sodium (ANAPROX) 220 MG tablet Take 2 tabs by mouth once daily       . sertraline (ZOLOFT) 100 MG tablet 1 TABLET DAILY  30 tablet  2  . tiotropium (SPIRIVA) 18 MCG inhalation capsule Place 1 capsule (18 mcg total) into inhaler and inhale daily.  90 capsule  3  . traMADol (ULTRAM) 50 MG tablet Take 1 tablet (50 mg total) by mouth every 6 (six) hours as needed for pain.  30 tablet  1  . albuterol (PROVENTIL) (2.5 MG/3ML) 0.083% nebulizer solution Take 3 mLs (2.5 mg total) by nebulization every 6 (six) hours as needed for wheezing.  60 vial  12    Allergies  Allergen Reactions  . Penicillins     REACTION: nausea and vomiting    Family History  Problem Relation Age of Onset  . Cancer Mother   . Heart failure Father   brother has DM  BP 124/82  Pulse 90   Temp(Src) 97.4 F (36.3 C) (Oral)  Ht 5\' 6"  (1.676 m)  Wt 289 lb (131.09 kg)  BMI 46.65 kg/m2  SpO2 95%  Review of Systems denies fever, blurry vision, headache, cough, urinary frequency, excessive diaphoresis, memory loss, hypoglycemia, rhinorrhea, and easy bruising.  She reports chest-wall pain, leg cramps, depression, and pruritis.  She has regurgitation almost daily.    Objective:   Physical Exam VS: see vs page GEN: no distress.  Morbid obesity HEAD: head: no deformity eyes: no periorbital swelling, no proptosis external nose and ears are normal mouth: no lesion seen NECK: supple, thyroid is not enlarged CHEST WALL: no deformity LUNGS:  Clear to auscultation CV: reg rate and rhythm, no murmur ABD: abdomen is soft, nontender.  no hepatosplenomegaly.  not distended.  no hernia.  Multiple healed surgical scars. MUSCULOSKELETAL: muscle bulk and strength are grossly normal.  no obvious joint swelling.  gait is normal and steady EXTEMITIES: no deformity.  no ulcer on the feet.  feet are of normal color and temp.  no edema PULSES: dorsalis pedis intact bilat.  no carotid bruit NEURO:  cn 2-12 grossly intact.   readily moves all 4's.  sensation is intact to touch on the feet SKIN:  Normal texture and temperature.  No rash or suspicious lesion is visible.   NODES:  None palpable at the neck PSYCH: alert, oriented x3.  Does not appear anxious nor depressed.  Lab Results  Component Value Date   HGBA1C 7.1* 06/26/2011   Lab Results  Component Value Date   TSH 0.86 06/26/2011   T3TOTAL 180.9 11/15/2006   T4TOTAL 10.4 11/15/2006      Assessment & Plan:  DM.  Needs increased rx, if it can be done with a regimen that avoids or minimizes hypoglycemia. Uncertain h/o thyroid problem, now euthyroid on no med. Morbid obesity.  This causes high risk to her health.  She lost weight due to bariatric surgery.  She needs f/u with [facility] [place] [facility]. Doe, which has several probably causes

## 2011-06-26 NOTE — Patient Instructions (Addendum)
good diet and exercise habits significanly improve the control of your diabetes.  please let me know if you wish to be referred to a dietician.  high blood sugar is very risky to your health.  you should see an eye doctor every year. controlling your blood pressure and cholesterol drastically reduces the damage diabetes does to your body.  this also applies to quitting smoking.  please discuss these with your doctor.  you should take an aspirin every day, unless you have been advised by a doctor not to. blood tests are being requested for you today.  You will receive a letter with results. You should consider going back to [facility] [place] [facility] to see if anything else can be done with your gastric band. (update:  i rx'ed metformin)

## 2011-06-27 ENCOUNTER — Encounter: Payer: Self-pay | Admitting: Endocrinology

## 2011-06-27 MED ORDER — METFORMIN HCL 500 MG PO TABS: 500.0000 mg | ORAL_TABLET | Freq: Every day | ORAL | Status: DC

## 2011-06-29 ENCOUNTER — Telehealth: Payer: Self-pay | Admitting: *Deleted

## 2011-06-29 NOTE — Telephone Encounter (Signed)
Called pt to inform of lab results, left message for pt to callback office (letter also mailed to pt). 

## 2011-06-29 NOTE — Telephone Encounter (Signed)
Pt informed of lab results and of new rx.  

## 2011-07-06 ENCOUNTER — Other Ambulatory Visit: Payer: Self-pay | Admitting: *Deleted

## 2011-07-06 ENCOUNTER — Telehealth: Payer: Self-pay | Admitting: Cardiology

## 2011-07-06 ENCOUNTER — Telehealth: Payer: Self-pay | Admitting: Pulmonary Disease

## 2011-07-06 NOTE — Telephone Encounter (Signed)
Follow up on previous call:  Patient calling back was told by Dr. [name] office to call Dr. Daleen Squibb office to get her records faxed to Dr. Esther Hardy office phone  [phone]. Let him decide if he would do the  Upcoming nose  Surgery. No date has been set.  Spoke with Onalee Hua in  medical records he advise me to let the patient know she will need to do a sign release for her records.   Patient advise when she was seen in the office in 09 she has did a sign release. Patient aware that a message will be sent around to nurse.

## 2011-07-06 NOTE — Telephone Encounter (Signed)
New problem:  Patient calling stating she need  Cardiac clearance for nose surgery. Ask patient when was the last time she saw Dr. Daleen Squibb patient stated it's has  been awhile. Research the appt through [facility] last office visit was 06/20/07. Inform patient base on her last office visit that she will need to come into the office to see another MD. Patient stated she is disable she will not be coming into the office , doesn't understand why from her last office visit Dr. Daleen Squibb won't clear here for upcoming surgery.  Patient states she will contacted Dr. [name] office to see if Dr. [name] & Dr. Daleen Squibb can discuss her case.

## 2011-07-06 NOTE — Telephone Encounter (Signed)
Patient called phone rings busy. 

## 2011-07-06 NOTE — Telephone Encounter (Signed)
Pt states she was referred to Dr. Haroldine Laws by Dr. [name] and was told she needs to have surgery on her septum. She is needing surgical clearance for this from Dr. [name]. Pt last seen 06-08-11 and does not want to come in for OV.  I advised I will forward message to Dr. [name]. Carron Curie, CMA

## 2011-07-06 NOTE — Telephone Encounter (Signed)
SN will collect the ov note and give this to [facility] to fax over to Dr. Allayne Stack office for surgical clearance.

## 2011-07-07 NOTE — Telephone Encounter (Signed)
Patient's sister [name] Carwin [phone] was called, spoke to brother n law he stated he has no way to get in touch with patient except by phone.States he lives in [place] and his wife has gone to Dr.States he will tell his wife when she gets home.Patient's phone number [phone] is correct #.

## 2011-07-07 NOTE — Telephone Encounter (Signed)
Patient called phone continues to ring busy.

## 2011-07-07 NOTE — Telephone Encounter (Signed)
Patient called phone rings busy.Sister Erskine Squibb

## 2011-07-08 NOTE — Telephone Encounter (Signed)
Follow up on previous call:  Patient calling stating the nurse have been trying to get reach her. Patient states  you have put in 336 to get through. Patient states that the nurse  does not need to  call her back not going to worried about records now.

## 2011-07-13 ENCOUNTER — Telehealth: Payer: Self-pay | Admitting: Pulmonary Disease

## 2011-07-13 NOTE — Telephone Encounter (Signed)
I spoke with pt and she is requesting to speak with SN personally regarding the recs he recommended before she could have her surgery. She states she was just in to see him and does not understand something's. She states when he calls her to put 336 and then dial her cell phone #. Please advise sn thanks

## 2011-07-13 NOTE — Telephone Encounter (Signed)
lmomtcb x1 for pt 

## 2011-07-13 NOTE — Telephone Encounter (Signed)
Returning call can be reached at [phone].Rebecca [name]

## 2011-07-15 NOTE — Telephone Encounter (Signed)
Dr. [name] has attempted to contact the pt and will try again.

## 2011-08-10 ENCOUNTER — Encounter (HOSPITAL_COMMUNITY): Payer: Self-pay

## 2011-08-10 ENCOUNTER — Encounter (HOSPITAL_COMMUNITY)
Admission: RE | Admit: 2011-08-10 | Discharge: 2011-08-10 | Disposition: A | Discharge status: Home / Self Care | Payer: Medicare Other | Source: Ambulatory Visit | Attending: Pulmonary Disease | Admitting: Pulmonary Disease

## 2011-08-10 DIAGNOSIS — G4733 Obstructive sleep apnea (adult) (pediatric): Secondary | ICD-10-CM | POA: Insufficient documentation

## 2011-08-10 DIAGNOSIS — J449 Chronic obstructive pulmonary disease, unspecified: Secondary | ICD-10-CM | POA: Insufficient documentation

## 2011-08-10 DIAGNOSIS — Z5189 Encounter for other specified aftercare: Secondary | ICD-10-CM | POA: Insufficient documentation

## 2011-08-10 DIAGNOSIS — J4489 Other specified chronic obstructive pulmonary disease: Secondary | ICD-10-CM | POA: Insufficient documentation

## 2011-08-10 HISTORY — DX: Hodgkin lymphoma, unspecified, extranodal and solid organ sites: C81.99

## 2011-08-10 NOTE — Progress Notes (Signed)
Ms Mcferran came in today for orientation to [facility].  Medical history and medications reviewed, nurse assessment was done.  Lengthy time spent with patient in reassurance and developing exercise plan and oxygen use.  Demonstration and practice of PLB using pulse oximeter.  Patient able to return demonstration satisfactorily. Safety and hand hygiene in the exercise area reviewed with patient.  Patient voices understanding.  Walk test delayed today due to patient wearing sandals.  She is to return tomorrow .  We will monitor oxygen and encourage and support this patient.

## 2011-08-11 ENCOUNTER — Encounter (HOSPITAL_COMMUNITY): Payer: Medicare Other

## 2011-08-13 ENCOUNTER — Encounter (HOSPITAL_COMMUNITY)
Admission: RE | Admit: 2011-08-13 | Discharge: 2011-08-13 | Disposition: A | Discharge status: Home / Self Care | Payer: Medicare Other | Source: Ambulatory Visit | Attending: Pulmonary Disease | Admitting: Pulmonary Disease

## 2011-08-13 DIAGNOSIS — Z5189 Encounter for other specified aftercare: Secondary | ICD-10-CM | POA: Diagnosis not present

## 2011-08-13 DIAGNOSIS — J449 Chronic obstructive pulmonary disease, unspecified: Secondary | ICD-10-CM | POA: Diagnosis not present

## 2011-08-13 DIAGNOSIS — G4733 Obstructive sleep apnea (adult) (pediatric): Secondary | ICD-10-CM | POA: Diagnosis not present

## 2011-08-18 ENCOUNTER — Encounter (HOSPITAL_COMMUNITY)
Admission: RE | Admit: 2011-08-18 | Discharge: 2011-08-18 | Disposition: A | Discharge status: Home / Self Care | Payer: Medicare Other | Source: Ambulatory Visit | Attending: Pulmonary Disease | Admitting: Pulmonary Disease

## 2011-08-18 DIAGNOSIS — Z5189 Encounter for other specified aftercare: Secondary | ICD-10-CM | POA: Diagnosis not present

## 2011-08-18 DIAGNOSIS — J449 Chronic obstructive pulmonary disease, unspecified: Secondary | ICD-10-CM | POA: Diagnosis not present

## 2011-08-18 DIAGNOSIS — G4733 Obstructive sleep apnea (adult) (pediatric): Secondary | ICD-10-CM | POA: Diagnosis not present

## 2011-08-19 ENCOUNTER — Telehealth: Payer: Self-pay | Admitting: Pulmonary Disease

## 2011-08-19 NOTE — Telephone Encounter (Signed)
Pt requesting a orling seating walker.  Dr [name] is this ok

## 2011-08-20 ENCOUNTER — Encounter (HOSPITAL_COMMUNITY)
Admission: RE | Admit: 2011-08-20 | Discharge: 2011-08-20 | Disposition: A | Discharge status: Home / Self Care | Payer: Medicare Other | Source: Ambulatory Visit | Attending: Pulmonary Disease | Admitting: Pulmonary Disease

## 2011-08-20 DIAGNOSIS — J449 Chronic obstructive pulmonary disease, unspecified: Secondary | ICD-10-CM | POA: Diagnosis not present

## 2011-08-20 DIAGNOSIS — G4733 Obstructive sleep apnea (adult) (pediatric): Secondary | ICD-10-CM | POA: Diagnosis not present

## 2011-08-20 DIAGNOSIS — Z5189 Encounter for other specified aftercare: Secondary | ICD-10-CM | POA: Diagnosis not present

## 2011-08-20 NOTE — Progress Notes (Signed)
Completed home exercise with patient. Reviewed exercise progression, routine, exercising at a comfortable pace, RPE/Dyspnea scales, how important it is to own a pulse oximeter and how to use one, weather conditions, warning signs and symptoms with exercise, and CP/NTG. We discussed when to call MD. Patient voices understanding. Patient has a goal to breathe better while doing ADLs. Patient would also like to be able to walk in [employer] to grocery shop and not have to use the electric chair.  Will continue to encourage and support.  Kennedee Kitzmiller L. Manson Passey, MS, NASM, CES

## 2011-08-20 NOTE — Telephone Encounter (Signed)
Pt came by the office today---rx has been written by SN for the pt to get a rolling walker with a seat.  Melanie gave this rx to the pt.  Nothing further needed.

## 2011-08-25 ENCOUNTER — Encounter (HOSPITAL_COMMUNITY)
Admission: RE | Admit: 2011-08-25 | Discharge: 2011-08-25 | Disposition: A | Discharge status: Home / Self Care | Payer: Medicare Other | Source: Ambulatory Visit | Attending: Pulmonary Disease | Admitting: Pulmonary Disease

## 2011-08-25 DIAGNOSIS — J449 Chronic obstructive pulmonary disease, unspecified: Secondary | ICD-10-CM | POA: Diagnosis not present

## 2011-08-25 DIAGNOSIS — Z5189 Encounter for other specified aftercare: Secondary | ICD-10-CM | POA: Diagnosis not present

## 2011-08-25 DIAGNOSIS — G4733 Obstructive sleep apnea (adult) (pediatric): Secondary | ICD-10-CM | POA: Diagnosis not present

## 2011-08-25 NOTE — Progress Notes (Signed)
Pulmonary Rehab Nutrition Screen  Rebecca [name] 61 y.o. female             Ht: 64" Ht Readings from Last 1 Encounters:  06/26/11 5\' 6"  (1.676 m)    Wt:   298.1 lb (135.5 kg) Wt Readings from Last 3 Encounters:  06/26/11 289 lb (131.09 kg)  06/08/11 280 lb 6.4 oz (127.189 kg)  05/04/11 287 lb 3.2 oz (130.273 kg)    BMI: 51.3  58.1%body fat                       Rate Your Plate Score: 51  Please answer the following questions:             YES  NO    Do you live in a nursing home?  X   Do you eat out more than 3 times per week?    X If yes, how many times per week do you eat out?   Do you have food allergies?   X If yes, what are you allergic to?  Have you gained or lost more than 10 lbs without trying?               X If yes, how much weight have you  lost or gained?  lbs over  weeks/mo   Do you want to lose weight?    X  If yes, what is a goal weight or amount of weight you would like to lose? lbs  Do you eat alone most of the time?  X    Do you eat less than 2 meals/day? X  If yes, how many meals do you eat?  Do you use canned and convenience food?  X   Do you use a salt shaker?  X   Do you drink more than 3 alcoholic drinks/day?  X If yes, how many drinks per day?  Are you having trouble with constipation? *  X If yes, what are you doing to help relieve constipation?  Do you have financial difficulties with buying food? *  X   Do you usually need help with grocery shopping or with cooking? *  X   Do you have a poor appetite? *                                       X   Do you have trouble chewing/ swallowing? *   X   Do you take vitamin and mineral or herbal supplements? *  X If yes, what kind of supplements do you currently take?    Past Medical History  Diagnosis Date  . OSA (obstructive sleep apnea)   . COPD (chronic obstructive pulmonary disease)   . Chest pain   . Chronic venous insufficiency   . Metabolic syndrome X   . Morbid obesity   . Hypothyroid   . GERD  (gastroesophageal reflux disease)   . IBS (irritable bowel syndrome)   . DJD (degenerative joint disease)   . Fibromyalgia   . Vitamin d deficiency   . History of headache   . Dysthymia   . Hodgkin lymphoma of extranodal or solid organ site    Labs Lipid Panel - no recent  Lab Results  Component Value Date   HGBA1C 7.1* 06/26/2011    Nutrition Risk Level:  High due to A1c > 6  Nutrition  Note Spoke with pt at length re: nutrition history. 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 Pt has been seeing a psychiatrist for the past 4 years to help her with her eating behavior. Pt states she wants to change her eating habits yet pt comments re: dietary changes place pt in the pre-contemplative state of change. Per discussion, pt states she does "better" when she has to be accountable to someone for her actions and when she has more structure to her day. Continue client-centered nutrition education by RD as part of interdisciplinary care.  Monitor and evaluate progress toward nutrition goal with team.

## 2011-08-27 ENCOUNTER — Encounter (HOSPITAL_COMMUNITY)
Admission: RE | Admit: 2011-08-27 | Discharge: 2011-08-27 | Disposition: A | Discharge status: Home / Self Care | Payer: Medicare Other | Source: Ambulatory Visit | Attending: Pulmonary Disease | Admitting: Pulmonary Disease

## 2011-08-27 DIAGNOSIS — G4733 Obstructive sleep apnea (adult) (pediatric): Secondary | ICD-10-CM | POA: Diagnosis not present

## 2011-08-27 DIAGNOSIS — Z5189 Encounter for other specified aftercare: Secondary | ICD-10-CM | POA: Diagnosis not present

## 2011-08-27 DIAGNOSIS — J449 Chronic obstructive pulmonary disease, unspecified: Secondary | ICD-10-CM | POA: Diagnosis not present

## 2011-08-27 NOTE — Progress Notes (Signed)
Nutrition Note Spoke with pt per pt request. Pt states she is frustrated because her breathing is not improving and she is not losing wt. Pt reports she's "not sure I need Pulmonary Rehab if I'm not getting better." Pt encouraged by this writer to given rehab more than 2 weeks before she decides whether it's working for her or not. Pt wt is down 0.4 kg from her admission wt. Per discussion with pt, pt was tired after rehab Tuesday 08/25/11 and had "a lot of energy yesterday." Pt states she painted a bedroom yesterday. This Clinical research associate ? If pt may have over done activity yesterday and that may be the reason for her SOB. Pt denies over doing activity yesterday. Pt states "I'm fine when I'm at home in the air conditioning, it's when I go outside that I have a problem." Pt assured that part of rehab is learning to cope with living with lung disease and avoid isolating herself at home. Pt states she does not have air conditioning in her car, which likely contributes to pt SOB. Pt agreed to meet with this writer 09/01/11 to design a nutrition plan that will work with pt likes and dislikes. Continue client-centered nutrition education by RD as part of interdisciplinary care.  Monitor and evaluate progress toward nutrition goal with team.

## 2011-09-01 ENCOUNTER — Telehealth: Payer: Self-pay | Admitting: Pulmonary Disease

## 2011-09-01 ENCOUNTER — Encounter (HOSPITAL_COMMUNITY)
Admission: RE | Admit: 2011-09-01 | Discharge: 2011-09-01 | Disposition: A | Discharge status: Home / Self Care | Payer: Medicare Other | Source: Ambulatory Visit | Attending: Pulmonary Disease | Admitting: Pulmonary Disease

## 2011-09-01 DIAGNOSIS — J4489 Other specified chronic obstructive pulmonary disease: Secondary | ICD-10-CM | POA: Insufficient documentation

## 2011-09-01 DIAGNOSIS — G4733 Obstructive sleep apnea (adult) (pediatric): Secondary | ICD-10-CM | POA: Insufficient documentation

## 2011-09-01 DIAGNOSIS — J449 Chronic obstructive pulmonary disease, unspecified: Secondary | ICD-10-CM | POA: Insufficient documentation

## 2011-09-01 DIAGNOSIS — Z5189 Encounter for other specified aftercare: Secondary | ICD-10-CM | POA: Diagnosis not present

## 2011-09-01 NOTE — Progress Notes (Signed)
Nutrition Note Time Spent 105 minutes Spoke with pt. Pt vented re: family issues and past psychological issues. Pt feels isolated from her alternative lifestyle due to medical issues. Pt has a group of 3 girl friends that she socializes with at least weekly. Pt interested in a low-carb meal plan. Pt states she has followed a 25 gram CHO diet in the past. This Clinical research associate encouraged pt to consume at least 150 grams of CHO daily and pt stated, "then I could eat anything." Pt continues to have to eat small, frequent meals due to gastric surgery. There are many foods pt states she does not like due to "taste/texture issues with the food." Pt reports "it's easier to tell you the foods I will eat." Per discussion with pt, pt likes meats. Barriers to dietary changes include pt's eating disorder and SOB. Pt c/o SOB while cooking. Pt has a rollator walker that she recently purchased. Pt plans to try using the rollator to help with cooking.  Diagnosis: Obesity related to excessive energy intake as evidenced by a BMI of 48.2 Intervention: Handouts given for: 1800 kcal meal plan and menu ideas Pt to review materials given Goal: 1-2# wt loss per week Plan: Continue client-centered nutrition education by RD as part of interdisciplinary care.  Monitor and evaluate progress toward nutrition goal with team.

## 2011-09-01 NOTE — Telephone Encounter (Signed)
Spoke with Randa Evens at [facility].  She states pt there now and is c/o increased depression, very tearful and making suicdal references. She states that she has tried to see her psych this wk but doc is on vacation. Per SN- if they feel like these references that she is making are real, she needs to go to ED asap. Randa Evens aware of this and verbalized understanding and states nothing further needed.

## 2011-09-01 NOTE — Progress Notes (Signed)
Pt very tearful today at Pulmonary Rehab. Pt quit exercising early, stating "why am I here?  It doesn't matter anymore"  When asked if she has suicidal thoughts or plan, pt states "I'm never telling the truth to that question again"  Pt also states she has a plan, but states "Its a wimpy plan and I wouldn't go through with it because no would take care of my dogs"   Pt denies having family resources and little friend support. Pt is tearful and upset today about a recent family dispute involving a trust fund and her living arrangement.  Emotional support offered, pt stated relief of overwhelming stress following conversation.  Pt also met with Mickle Plumb, RD and spirits were greatly improved after that consultation .  Phone call to pt PCP Dr. [name] to request appointment for depression screening.  Dr. Jodelle Green office unable to offer appt and pt frequently exhibits similar affect as today.   Advised pt should present to ED if felt to be true suicidal ideation.  Pt denies such ideation.   Pt advised to contact Dr. [name] to schedule appt to discuss.  Pt states she does have social plans tomorrow which she is looking forward to and plans to attend pulmonary rehab next week.  Pt left department on a positive, cheerful demeanor.  Will continue to monitor.

## 2011-09-03 ENCOUNTER — Encounter (HOSPITAL_COMMUNITY): Payer: Medicare Other

## 2011-09-08 ENCOUNTER — Encounter (HOSPITAL_COMMUNITY): Payer: Medicare Other

## 2011-09-10 ENCOUNTER — Encounter (HOSPITAL_COMMUNITY)
Admission: RE | Admit: 2011-09-10 | Discharge: 2011-09-10 | Disposition: A | Discharge status: Home / Self Care | Payer: Medicare Other | Source: Ambulatory Visit | Attending: Pulmonary Disease | Admitting: Pulmonary Disease

## 2011-09-10 DIAGNOSIS — Z5189 Encounter for other specified aftercare: Secondary | ICD-10-CM | POA: Diagnosis not present

## 2011-09-10 DIAGNOSIS — G4733 Obstructive sleep apnea (adult) (pediatric): Secondary | ICD-10-CM | POA: Diagnosis not present

## 2011-09-10 DIAGNOSIS — J449 Chronic obstructive pulmonary disease, unspecified: Secondary | ICD-10-CM | POA: Diagnosis not present

## 2011-09-10 NOTE — Progress Notes (Signed)
Rebecca [name] came to Pulmonary Rehab today, very tearful, inappropriate to instructor in education class reported other patients.  On first exercise station she was tearful stating "what's the use of coming here"  State to this writer that she wanted to drop out until she could get the depression under control.  She left abruptly after that, did not get check out vital signs or follow up.  A telephone call was made as follow up and no answer.  Message was left asking her to return call so we can finalize discharge.  Note via in basket to Dr. [name].

## 2011-09-15 ENCOUNTER — Encounter (HOSPITAL_COMMUNITY): Admission: RE | Admit: 2011-09-15 | Payer: Medicare Other | Source: Ambulatory Visit

## 2011-09-17 ENCOUNTER — Encounter (HOSPITAL_COMMUNITY): Payer: Medicare Other

## 2011-09-17 NOTE — Progress Notes (Signed)
PULMONARY REHAB OUTCOMES REPORT  Rebecca [name] has been discharged due to non compliance with the pulmonary rehab program. RN and patient discussed discharge and RN will send a note to Dr. [name] via inbasket. Patient completed 7 visits. Tolerated well for the most part. Patient had high levels of anxiety and stress. Needed a lot of one on one. Patient o2 sats on 4L maintained at or above 93%NCC.Patient did not complete or turn in her orientation quality of life paperwork. Patient was given several copies to redo and never brought them back. Since we had no paperwork turned there are no outcomes to report. Patient did complete a 6 minute walk test for her orientation and ambulated 811ft on 4L and maintained sats at 91% NCC. Discharge from program has been finalized between RN and MD.  [name], MS, NASM, CES

## 2011-09-17 NOTE — Progress Notes (Signed)
Rebecca [name] came in to Pulmonary Rehab today to exercise.  On arrival, very short of breath, states she was exhausted from walking from parking deck. She refuses to use valet parking.  Patient was informed that we discussed discharge at her last visit, she left abruptly and we proceeded with discharge plan as she requested.  Today she states she does not feel this program does anything to help he, this is not reality, reality is cleaning her house, washing dishes, cooking, grocery shopping.  Pursed lip breathing does not help when she cannot breathe through her nose, the only reason she is coming here is to be approved for surgery to repair her nose.  Patient was told since she feels she is not getting any benefit from the program we will discharge her.  She was offered assistance back to her car due to the walk and heat and she became angry, stating she had to do it herself.  She was reassured we were trying to help since she was having a difficult day.  She left unassisted.

## 2011-09-22 ENCOUNTER — Encounter (HOSPITAL_COMMUNITY): Payer: Medicare Other

## 2011-09-24 ENCOUNTER — Encounter (HOSPITAL_COMMUNITY): Payer: Medicare Other

## 2011-09-29 ENCOUNTER — Encounter (HOSPITAL_COMMUNITY): Payer: Medicare Other

## 2011-09-29 ENCOUNTER — Telehealth: Payer: Self-pay | Admitting: Pulmonary Disease

## 2011-09-29 NOTE — Telephone Encounter (Signed)
ATCx2, line busy, WCB. Danessa Mensch, CMA  

## 2011-09-29 NOTE — Telephone Encounter (Signed)
Line still busy.

## 2011-09-30 NOTE — Telephone Encounter (Signed)
lmomtcb x1 for pt 

## 2011-09-30 NOTE — Telephone Encounter (Signed)
I spoke with pt and she states she needs a letter from [facility] stating she is capable of living by herself and is able to take care of herself. She states her sister is trying to get her kicked out of her house and is stating she is not capable of this. So due this pt hired a Clinical research associate. Pt states if/when letter is done to call her to see if she can pick it up bc it depends if she will be in [place] or not. Please advise SN thanks

## 2011-09-30 NOTE — Telephone Encounter (Signed)
Pt has returned call & can be reached [phone].  Pt stated that we have to dial the 336 in order for the call to go through.  Pt states that she is in a place that she is getting bad reception & will try back later this afternoon if she doesn't hear anything back.   Antionette Fairy

## 2011-10-01 ENCOUNTER — Encounter (HOSPITAL_COMMUNITY): Payer: Medicare Other

## 2011-10-05 NOTE — Telephone Encounter (Signed)
Pt called to check on status of letter.  Rebecca [name]

## 2011-10-06 ENCOUNTER — Encounter: Payer: Self-pay | Admitting: *Deleted

## 2011-10-06 ENCOUNTER — Encounter (HOSPITAL_COMMUNITY): Payer: Medicare Other

## 2011-10-06 NOTE — Telephone Encounter (Signed)
Called and spoke with pt and she is aware that letter has been completed and will be left up front for her to stop by tomorrow and pick up.  Nothing further is needed.

## 2011-10-06 NOTE — Telephone Encounter (Signed)
Leigh, will you pls advise on status of letter so we can inform pt.  Thank you.

## 2011-10-08 ENCOUNTER — Encounter (HOSPITAL_COMMUNITY): Payer: Medicare Other

## 2011-10-13 ENCOUNTER — Encounter (HOSPITAL_COMMUNITY): Payer: Medicare Other

## 2011-10-15 ENCOUNTER — Encounter (HOSPITAL_COMMUNITY): Payer: Medicare Other

## 2011-10-20 ENCOUNTER — Encounter (HOSPITAL_COMMUNITY): Payer: Medicare Other

## 2011-10-22 ENCOUNTER — Encounter (HOSPITAL_COMMUNITY): Payer: Medicare Other

## 2011-10-27 ENCOUNTER — Encounter (HOSPITAL_COMMUNITY): Payer: Medicare Other

## 2011-10-29 ENCOUNTER — Encounter (HOSPITAL_COMMUNITY): Payer: Medicare Other

## 2011-11-03 ENCOUNTER — Encounter (HOSPITAL_COMMUNITY): Payer: Medicare Other

## 2011-11-05 ENCOUNTER — Encounter (HOSPITAL_COMMUNITY): Payer: Medicare Other

## 2011-11-10 ENCOUNTER — Encounter (HOSPITAL_COMMUNITY): Payer: Medicare Other

## 2011-11-12 ENCOUNTER — Encounter (HOSPITAL_COMMUNITY): Payer: Medicare Other

## 2011-11-16 ENCOUNTER — Telehealth: Payer: Self-pay | Admitting: Pulmonary Disease

## 2011-11-16 MED ORDER — CLOTRIMAZOLE-BETAMETHASONE 1-0.05 % EX CREA: TOPICAL_CREAM | Freq: Two times a day (BID) | CUTANEOUS | Status: DC

## 2011-11-16 MED ORDER — ALBUTEROL SULFATE HFA 108 (90 BASE) MCG/ACT IN AERS: 2.0000 | INHALATION_SPRAY | Freq: Four times a day (QID) | RESPIRATORY_TRACT | Status: DC | PRN

## 2011-11-16 NOTE — Telephone Encounter (Signed)
Spoke with pt Notified that the rescue inhaler was sent to pharm She is requesting something for rash, redness and itching underneath belly and breasts. She has tried all otc lotions and powders and states nothing helps. Per TP, okay to call in lotrisone. Rx was called in and the pt made aware.

## 2011-11-17 ENCOUNTER — Encounter (HOSPITAL_COMMUNITY): Admission: RE | Admit: 2011-11-17 | Payer: Medicare Other | Source: Ambulatory Visit

## 2011-11-19 ENCOUNTER — Encounter (HOSPITAL_COMMUNITY): Payer: Medicare Other

## 2011-11-24 ENCOUNTER — Encounter (HOSPITAL_COMMUNITY): Payer: Medicare Other

## 2011-11-26 ENCOUNTER — Encounter (HOSPITAL_COMMUNITY): Payer: Medicare Other

## 2011-12-07 ENCOUNTER — Encounter: Payer: Self-pay | Admitting: *Deleted

## 2011-12-08 ENCOUNTER — Ambulatory Visit (INDEPENDENT_AMBULATORY_CARE_PROVIDER_SITE_OTHER): Payer: Medicare Other | Admitting: Pulmonary Disease

## 2011-12-08 ENCOUNTER — Encounter: Payer: Self-pay | Admitting: Pulmonary Disease

## 2011-12-08 VITALS — BP 132/78 | HR 84 | Temp 98.8°F | Ht 66.0 in | Wt 289.0 lb

## 2011-12-08 DIAGNOSIS — K219 Gastro-esophageal reflux disease without esophagitis: Secondary | ICD-10-CM

## 2011-12-08 DIAGNOSIS — G4733 Obstructive sleep apnea (adult) (pediatric): Secondary | ICD-10-CM

## 2011-12-08 DIAGNOSIS — E559 Vitamin D deficiency, unspecified: Secondary | ICD-10-CM

## 2011-12-08 DIAGNOSIS — J449 Chronic obstructive pulmonary disease, unspecified: Secondary | ICD-10-CM

## 2011-12-08 DIAGNOSIS — E8881 Metabolic syndrome: Secondary | ICD-10-CM

## 2011-12-08 DIAGNOSIS — M1990 Unspecified osteoarthritis, unspecified site: Secondary | ICD-10-CM

## 2011-12-08 DIAGNOSIS — K589 Irritable bowel syndrome without diarrhea: Secondary | ICD-10-CM

## 2011-12-08 DIAGNOSIS — R7309 Other abnormal glucose: Secondary | ICD-10-CM | POA: Diagnosis not present

## 2011-12-08 DIAGNOSIS — R7302 Impaired glucose tolerance (oral): Secondary | ICD-10-CM | POA: Insufficient documentation

## 2011-12-08 DIAGNOSIS — F341 Dysthymic disorder: Secondary | ICD-10-CM

## 2011-12-08 DIAGNOSIS — J4489 Other specified chronic obstructive pulmonary disease: Secondary | ICD-10-CM

## 2011-12-08 DIAGNOSIS — I872 Venous insufficiency (chronic) (peripheral): Secondary | ICD-10-CM

## 2011-12-08 NOTE — Patient Instructions (Addendum)
Today we updated your med list in our EPIC system...    Continue your current medications the same...    Be sure to take your breathing meds regularly...  We will inquire to see if there is any option for an alternative Oxygen delivery system...    Alternatively you may need to pursue the ENT surgery option to open up your nasal passages...  Let's plan a recheck in 3-4 months w/ FASTING blood work & CXR at that time.Marland KitchenMarland Kitchen

## 2011-12-08 NOTE — Progress Notes (Signed)
Subjective:    Patient ID: Rebecca [name], female    DOB: [birth year], 61 y.o.   MRN: [id number]  HPI 61 y/o WF known to me w/ mult med problems as noted below...  ~  January 21, 2009:  6 mo f/u & she is very happy w/ her results from LapBand surgery- states she's lost >100#... our scales show plateau ~250-260# & she hasn't been back to DrMMartin in >88yr due to the $350 charge per visit she says... as noted- she's stopped the O2 ("don't need it, I'm walking daily & doing well");  she called 9/10 wanting to restart Pred10mg  due to dyspnea but she is off this again & improved... she wanted to change the Zoloft to Wellbutrin but couldn't afford the latter med...  ~  April 11, 2009:  she had stopped all meds... saw TP 04/01/09 w/ COPD exac- Rx'd w/ Avelox, Mucinex, Advair, Spiriva> improved... then symptoms came roaring back x 1wk- congested, wheezing, cough, SOB, etc... she tells me that she heats w/ wood & space heaters... also using "mullin & eucalyptus" rec by a homeopath friend in [place]... hasn't been using her nebulizer, and only taking 600mg  Mucinex Bid... REC> we discussed Depo/ Pred, incr Mucinex w/ fluids, incr NEBS regularly, continue Advair500, Spiriva, etc;  check CXR (COPD, scarring, NAD); & Labs (/all=OK)...  ~  April 30, 2009:  she has mult somatic complaints and "lots of issues w/ breathing"... tired all the time & can't understand why... tells me she passed a kidney stone recently (no med attention)... chest much improved from last OV; on Pred 10mg /d now & wants to wean; seeing [name] (eating disorder specialist) to help her not use food & he rec applying for disability... PFT today shows severe airflow obstruction w/ FEV1= 1.51L (55%) & %1sec=59... she wants off Pred.  ~  Jul 15, 2010:  63mo ROV & she has seen TP over the past yr for acute exac- Rx Doxy/ Pred & improved;  She has received disability & will be elig for Medicare this fall she says;  Notes "breathing worse- my air  goes in but comes right back out";  O2 sat 77% on arrival but not using oxygen because "it doesn't help" I explained why it helps but she isn't convinced; she has motorized wheelchair but can't transport it; she mentioned alternative lifestyle introduced by her daughter (child she gave up for adoption as a teen)...  CXR & labs are stable> encouraged to stay on meds regularly (-she hasn't), use O2 regularly (-she didn't), use CPAP nightly (-not using at all), get on diet, get wt down (-no change), increase exercise (-not able she says) & offered PulmRehab (-she declined)...  ~  January 14, 2011:  63mo ROV & she notes her breathing is much worse and "something has to be done", notes that she is on Medicare now;  She is tearful- "I can't do anything, can't go anywhere, no friends, I sleep 15H a day" but she still has her pets & new doberman "[name]" rescue dog; she notes SOB w/ ADLs- worse over the last yr, very poorly conditioned & hasn't tried exercise program & hasn't lost weight (prev declined pulm rehab on mult occas); she has a motorized wheelchair but it is too big & won't fit in her house (uses it when she worked at [employer]), wants a rolling walker w/ seat so she can use it in the house & get from room to room; she saw TP 9/12 & she arranged  for [facility] to set up new O2 system but she says it's way too heavy & not really portable therefore doesn't use it much- needs better port system like she has seen on the internet; also [facility] didn't come to check/ service/ replace old CPAP unit but she really needs new sleep study first;  Also c/o CP- her same atypCP that she has had, sharp/ sore/ hurts to breathe or cough, & Aleve helps;  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 which would be multifactorial from her COPD/Emphysema, OSA, Obesity;  SEE PREV STUDIES BELOW> we discussed:  2DEcho,  split night sleep study,  Cards consult w/ DrBensimhon,  PulmRehab program somewhere (she prefers [place] or [place])  ~  June 08, 2011:  63mo ROV & she did not pursue any of the diagnostic or therapeutic interventions we mentioned last OV (she refused 2DEcho, split night sleep study, formal pulm rehab program- "I can't afford the 20% copay"); her CC today is that she has a hard time breathing thru her nose (prob dev septum) & she wants ENT referral- OK;  She has Home O2, Advair500- Bid, Spiriva, Mucinex- 2Bid w/ Fluids, ProairHFA rescue inhaler;  She has the Helios liq O2 system but doesn't like this either- ?it "cakes" the phlegm in her nose?  She has Lasix40mg  taking 1-2 daily but only using as needed for swelling she says;  She has Vicodin, Anaprox, Ultram, Flexeril for various pains and muscle spasm (she says she gets severe musc spasms every time she uses the Flutter valve to aide in mucocilliary clearance);  She notes that the Flexeril 5mg  Tid is "really great- it helps me breathe better");  She has Zoloft 100mg /d for depression and Alprazolam 0.5mg  Tid prn anxiety...  I have reiterated the need for sleep study/ CPAP, 2DEcho to eval for PulmHTN, and formal PulmRehab program as one of the most important interventions that will help her breathing etc;  She notes that she has to stop when ambulating from the bathroom to the kitchen, I feel pulm rehab is critically important to her, she declines & will not consider this intervention...  Today she wants ENT referral for her nose & wants another Endocrine referral for her Thyroid (recall prev insistence that she had thyroid dysfunction despite all normal TFTs & finally got an Endocrine in [place] ([name]) to prescribe thyroid hormone for her at her request)... Review of EPIC> last labs were 5/12 & these need updating... CXR 3/13 showed borderline heart size, sl incr interstitial markings & left basilar Atx, lungs otherw clear, NAD, degen changes in TSpine...  ~  December 08, 2011:  63mo ROV & [place] Curb saw DrEllison for Endocrine 4/13 but she is quite upset as he never addressed her Thyroid, just told  her she had DM (A1c was 7.1) & prescribed Metformin500mg /d which she refused to fill & will not follow up w/ him; I offered another Endocrine eval, but she is thinking she might just ret to [name] in [place], she will let me know... We discussed "abnormal glucose tolerance" as a pre-diabetic diagnosis & the need for low carb wt reducing diet ...    She has had a severe emotional upset over the last 47mo> her sister Erskine Squibb has been trying to get her evicted from her home (deceased mother's house that she gets to stay in during her life per Trust arrangement); [name] has been helping her w/ eating disorder & anxiety/depression & she tells me that she doesn't need meds- her alternative lifestyle  functions to release endorphins that in turn stimulate her endocrine system to prevent depression she says...    Recall that [name] wanted to operate on her nose/sinuses/upper airway but we encouraged caution due to her severe airway dis/ COPD; she enrolled in [facility] but found this "a waste of time & money" as it was 65miles,/ $60/wk & she barely got to do any exercise on the machines; it was a greater effort to get to the rehab dept than to exercise when there, and she felt that she did more exercise at home doing her cleaning, cooking, laundry, bathing, caring for dogs, etc; she even painted a room at home (but has someone do her yard work); she feels the prob is that the nasal cannula doesn't help due to nasal obstruction & putting cannula in mouth is only a temp fix; we discussed the poss to transtracheal catheter & she is interested in pursuing this approach...     She further notes that she drinks a liver detoxer and 1 gal of home carbonated water per day "I belch & it makes room for me to eat"; weight is up 9# to 289# and we reviewed low carb, low fat, wt reducing diet...    We reviewed prob list, meds, xrays and labs> see below for updates >> she declines flu vaccine...           Problem List:  OBSTRUCTIVE  SLEEP APNEA (ICD-327.23) - sleep study 2005 showed RDI=15 w/ desat to 78%, loud snoring & +leg jerks> on CPAP 10, prev used intermittently but now not at all... ~  11/12:  She has agreed to a new Sleep Study- split night protocol, to recheck her OSA & determine CPAP needs (check ABG if poss too)==> she never followed thru w/ the study.  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 supposed to be on NEBULIZER w/ XOPENEX 1.25mg  regularly (hasn't used), ADVAIR 500Bid + SPIRIVA daily (she freq stops all meds) ==> she had a second opinion consult at [facility] by DrAdair in 2006... ~  A1AT level is normal 218 (83-200) 3/09... ~  baseline CXR w/ borderline Cor, COPD, interstitial scarring/ atelectasis/ obesity... ~  PFTs 3/07 showed FVC=2.74 (82%), FEV1=1.67 (62%), %1sec=61, mid-flows=27%pred... improved from 2005. ~  CTChest in 2007 & 4/09 showed severe centrilob emyphysema, + interstitial fibrosis, sm HH, left renal cyst... neg for PE... ~  2010:  breathing improved w/ weight reduction after Lap-band surg... ~  2/11: COPD exac w/ neg CXR (chr changes, NAD), Rx w/ Depo/ Pred/ Avelox/ Mucinex/ NEBS/ Advair/ Spiriva/ etc... ~  PFTs 3/11 showed FVC= 2.57 (73%), FEV1= 1.51 (55%), %1sec=59, mid-flows= 21%pred. ~  Noncompliant w/ Rx & O2 thru 2012... On disability since 2011 & finally got Medicare coverage 9/12... ~  11/12: presents w/ worsening breathing & dyspnea w/ ADLs ==> we outlined further eval w/ 2DEcho & Sleep Study; plus Rx w/ NEBS QID, Advair500Bid, Spiriva daily, Mucinex, Oxygen, Lasix, Zoloft, Xanax, etc (she declined to proceed w/ the sleep study, 2DEcho, etc)... ~  4/13: she is c/o the nasal O2 not helping since it's hard to breathe thru nose & wants ENT referral==> saw [name] who wanted to do surg but she has severe COPD & hi risk; asked to start PulmRehab & she agrees... ~  10/13: she did PulmRehab for 51mo 6-7/13 but quit since it was "fruitless"- barely got to do any  exercise since it was so difficult getting to the facility,etc; still c/o nasal problems limiting  her benefit from O2 & we discussed the poss of transtracheal oxygen  ATYPICAL CHEST PAIN (ICD-786.50) - eval by DrWall in 2009. ~  baseline EKG w/ NSR, PVC's, NSSTTWA, NAD... ~  2DEcho 2/05 showed mild MR/ TR, norm LA/ RV, EF=50-55%... ~  NuclearStressTest 4/09 was norm- no ischemia, EF=55%... similiar to prev studies at [facility] 2005 & 2007...  VENOUS INSUFFICIENCY, CHRONIC (ICD-459.81) - Hx chr ven insuffic w/ edema... on LASIX 40mg - 1-2 daily (she self medicates)...  METABOLIC SYNDROME X (ICD-277.7) - she is on diet alone (refused Metformin therapy)... ~  labs 9/08 showed BS=120... FBS 5/08 was 93, HgA1c=7.1 ~  labs 5/10 showed BS= 182, A1c= 6.4 ~  labs 2/11 showed BS= 76, A1c= 6.5 ~  Labs 5/12 showed BS= 103, A1c= 6.5 ~  She saw DrEllison 4/13 w/ A1c=7.1 but she rejects the diagnosis of Diabetes & refused Metform500mg /d... ~  10/13:  We discussed IMPAIRED GLUCOSE TOLERANCE & need for low carb wt reducing diet and incr exercise...  MORBID OBESITY (ICD-278.01) - she prev saw [name] for counselling about her eating disorder... ~  max weight ~340# before lap band surg 7/09 by DrMartin. ~  weight 12/09 = 291# ~  weight 5/10 = 265# ~  weight 11/10- = 254# ~  weight 2/11 = 260# ~  Weight 5/12= 290# ~  Weight 11/12 = 286# ~  Weight 4/13 = 280# ==> BMI=45 ~  Weight 10/13 = 289#  ? Hx HYPOTHYROIDISM (ICD-244.9) - off thyroid meds since 1/10... she was started on Synthroid and followed by DrRSmith in [place] & last seen 7/09- note reviewed... she refuses f/u by [name]- we will recheck TSH off Synthroid Rx... ~  labs 5/10 off Synthroid since 1/10 showed TSH= 1.13 ~  labs 2/11 showed TSH= 1.82 ~  Labs 5/12 showed TSH= 1.81 ~  2013: She wanted to restart Thyroid hormone but TSH remains normal> referred to Endocrine for further eval but she was upset w/ DrEllison's eval; she will decide if she wants    GERD (ICD-530.81) - prev on Nexium but off all meds since 1/10... IRRITABLE BOWEL SYNDROME (ICD-564.1) - colonoscopy 1982 by DrPatterson was WNL.Marland KitchenMarland Kitchen  ?KIDNEY STONE Hx >> pt said she passed a kidney stone 3/11 w/o any medical attention, w/o work up or proof of same; entry made here for future reference if needed.  UTERINE CANCER, HX OF (ICD-V10.42) - s/p laparoscopic hysterectomy & BSO 7/07 by DrSkinner at [facility]...   DEGENERATIVE JOINT DISEASE (ICD-715.90) - on VICODIN tid prn,  ANAPROX 220mg  Bid prn, ULTRAM 50mg  Prn... ? 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 she was on Wellbutrin & Vicodin when last seen in 2006... VITAMIN D DEFICIENCY (ICD-268.9) - Vit D level 5/10 = 16... rec> start OTC Vit D 2000 u daily.  Hx of HEADACHE (ICD-784.0)  DYSTHYMIA (ICD-300.4) - off Celexa,  off Zoloft, and on ALPRAZOLAM 0.5mg Tid as needed... she has seen psychiatry in the past (DrBarnett in [place]) and was Dx w/ seasonal affective disorder... prev seeing psychologist [name] for eating disorder counselling... ~  4/13: she mentioned seeing [name] again for eating disorder & counseling for depression etc...   Past Surgical History  Procedure Date  . 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 Appendectomy   . Cholecystectomy 1982  . Laparoscopic hysterectomy 2007    forsythe  . Bilateral salpingoophorectomy 2007    forsythe  . Laparoscopic gastric  banding 08/2007    Dr. Daphine Deutscher    Outpatient Encounter Prescriptions as of 12/08/2011  Medication Sig Dispense Refill  . albuterol (PROAIR HFA) 108 (90 BASE) MCG/ACT inhaler Inhale 2 puffs into the lungs every 6 (six) hours as needed.  1 Inhaler  5  . albuterol (PROVENTIL) (2.5 MG/3ML) 0.083% nebulizer solution Take 3 mLs (2.5 mg total) by nebulization every 6 (six) hours as needed for wheezing.  60 vial  12  . ALPRAZolam (XANAX) 0.5 MG tablet Take 1/2 to 1 tablet by mouth 3  times daily as needed for nerves. Not to exceed 3 per day.  90 tablet  2  . cetirizine (ZYRTEC) 10 MG tablet Take 10 mg by mouth daily.        . clotrimazole-betamethasone (LOTRISONE) cream Apply topically 2 (two) times daily.  30 g  1  . cyclobenzaprine (FLEXERIL) 5 MG tablet Take 1 tablet (5 mg total) by mouth 3 (three) times daily as needed for muscle spasms.  90 tablet  5  . Dextromethorphan-Guaifenesin (MUCINEX DM MAXIMUM STRENGTH) 60-1200 MG per 12 hr tablet Take 1 tablet by mouth daily as needed.        . Fluticasone-Salmeterol (ADVAIR DISKUS) 500-50 MCG/DOSE AEPB Inhale 1 puff into the lungs 2 (two) times daily.  180 each  3  . furosemide (LASIX) 40 MG tablet Take 1 to 2 tabs by mouth once daily as needed for swelling  60 tablet  11  . HYDROcodone-acetaminophen (VICODIN) 5-500 MG per tablet Take 1 tablet by mouth 3 (three) times daily as needed for pain.  90 tablet  5  . metFORMIN (GLUCOPHAGE) 500 MG tablet Take 1 tablet (500 mg total) by mouth daily with breakfast.  60 tablet  2  . naproxen sodium (ANAPROX) 220 MG tablet Take 2 tabs by mouth once daily       . sertraline (ZOLOFT) 100 MG tablet 1 TABLET DAILY  30 tablet  2  . tiotropium (SPIRIVA) 18 MCG inhalation capsule Place 1 capsule (18 mcg total) into inhaler and inhale daily.  90 capsule  3  . traMADol (ULTRAM) 50 MG tablet Take 1 tablet (50 mg total) by mouth every 6 (six) hours as needed for pain.  30 tablet  1    Allergies  Allergen Reactions  . Penicillins     REACTION: nausea and vomiting    Current Medications, Allergies, Past Medical History, Past Surgical History, Family History, and Social History were reviewed in [employer] record.   Review of Systems         See HPI - all other systems neg except as noted... The patient complains of dyspnea on exertion and difficulty walking; chest discomfort, & leg weakness.  The patient denies anorexia, fever, weight loss, weight gain, vision loss,  decreased hearing, hoarseness, syncope, prolonged cough, headaches, hemoptysis, abdominal pain, melena, hematochezia, severe indigestion/heartburn, hematuria, incontinence, suspicious skin lesions, transient blindness, depression, unusual weight change, abnormal bleeding, enlarged lymph nodes, and angioedema.    Objective:   Physical Exam    WD, Morbidly Obese, 61 y/o WF in NAD... GENERAL:  Alert & oriented; pleasant & cooperative; she is in better spirits today. HEENT:  [place]/AT, EOM-wnl, PERRLA, EACs-clear, TMs-wnl, NOSE-clear, THROAT-clear & wnl. NECK:  Supple w/ fairROM; no JVD; normal carotid impulses w/o bruits; no thyromegaly or nodules palpated; no lymphadenopathy. CHEST:  decr BS bilat; w/ silent chest- no wheezing, rales, or rhonchi... HEART:  Regular Rhythm; without murmurs/ rubs/ or gallops heard... ABDOMEN:  Obese, soft & nontender; normal bowel sounds; no organomegaly or masses detected. EXT: without deformities, mod arthritic changes; no varicose veins/ +venous insuffic/trace edema. NEURO:  CNs intact; no focal neuro deficits... DERM: few ecchymoses, no rash etc...  RADIOLOGY DATA:  Reviewed in the EPIC EMR & discussed w/ the patient...  LABORATORY DATA:  Reviewed in the EPIC EMR & discussed w/ the patient...   Assessment & Plan:    DYSPNEA>  The concern is that she has developed secondary pulm hypertension from her COPD/ Emphysema, OSA (largely untreated), morbid obesity, hypoxemia, etc;  We outlined the need for further eval w/ 2DEcho & repeat sleep study (split night study to expedite rx) but she has repeatedly declined these studies, refuses to consider them again today;  I reiterated the serious nature of her respiratory problem & need for these diagnostic tests but she refuses;  She tried PulmRehab 6-7/13 but found it worthless as it was difficult to get there, too expensive, & she received min use of the machines; states she got more exercise at home w/ cleaning, laundry,  cooking, pet care, etc...  OSA>  As noted she needs new sleep study & appropriate new CPAP apparatus to facilitate compliance...  COPD/ Emphysema>  Encouraged to stay on her O2 regularly, continue NEBS, Advair/ Spiriva regularly, use the Mucinex as needed; needs to lose wt & incr exercise- she tried PulmRehab as above; she does not feel she is benefiting from the O2 due to nasal problems & she has seen [name]; she might do better w/ transtrach oxygen vs sm trach tube...  Hx CP w/ neg cardiac evals including several Myoviews; we will check 2DEcho to screen for PulmHTN...  OBESITY>  She had remote lap band surg by DrMMartin; Met syndrome, must get wt down etc; she is a lap band failure & prev counseling by [name]...  ? Hx Hypothy>  TFTs remain normal off meds- no problem...  GI>  GERD> she denies symptoms & uses OTC meds prn...  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  Dysthymia>  Much stress in her life, on Alpraz prn; has embraced alt lifestyle...   Patient's Medications  New Prescriptions   No medications on file  Previous Medications   ALBUTEROL (PROAIR HFA) 108 (90 BASE) MCG/ACT INHALER    Inhale 2 puffs into the lungs every 6 (six) hours as needed.   ALBUTEROL (PROVENTIL) (2.5 MG/3ML) 0.083% NEBULIZER SOLUTION    Take 3 mLs (2.5 mg total) by nebulization every 6 (six) hours as needed for wheezing.   ALPRAZOLAM (XANAX) 0.5 MG TABLET    Take 1/2 to 1 tablet by mouth 3 times daily as needed for nerves. Not to exceed 3 per day.   CETIRIZINE (ZYRTEC) 10 MG TABLET    Take 10 mg by mouth daily.     CLOTRIMAZOLE-BETAMETHASONE (LOTRISONE) CREAM    Apply topically 2 (two) times daily.   CYCLOBENZAPRINE (FLEXERIL) 5 MG TABLET    Take 1 tablet (5 mg total) by mouth 3 (three) times daily as needed for muscle spasms.   DEXTROMETHORPHAN-GUAIFENESIN (MUCINEX DM MAXIMUM STRENGTH) 60-1200 MG PER 12 HR TABLET    Take 1 tablet by mouth daily as needed.      FLUTICASONE-SALMETEROL (ADVAIR DISKUS) 500-50 MCG/DOSE AEPB    Inhale 1 puff into the lungs 2 (two) times daily.   FUROSEMIDE (LASIX) 40 MG TABLET    Take 1 to 2 tabs by mouth once daily as needed  for swelling   HYDROCODONE-ACETAMINOPHEN (VICODIN) 5-500 MG PER TABLET    Take 1 tablet by mouth 3 (three) times daily as needed for pain.   NAPROXEN SODIUM (ANAPROX) 220 MG TABLET    Take 2 tabs by mouth once daily    TIOTROPIUM (SPIRIVA) 18 MCG INHALATION CAPSULE    Place 1 capsule (18 mcg total) into inhaler and inhale daily.   TRAMADOL (ULTRAM) 50 MG TABLET    Take 1 tablet (50 mg total) by mouth every 6 (six) hours as needed for pain.  Modified Medications   No medications on file  Discontinued Medications   METFORMIN (GLUCOPHAGE) 500 MG TABLET    Take 1 tablet (500 mg total) by mouth daily with breakfast.   SERTRALINE (ZOLOFT) 100 MG TABLET    1 TABLET DAILY

## 2011-12-30 ENCOUNTER — Telehealth: Payer: Self-pay | Admitting: Pulmonary Disease

## 2011-12-30 ENCOUNTER — Other Ambulatory Visit: Payer: Self-pay | Admitting: Adult Health

## 2011-12-30 NOTE — Telephone Encounter (Signed)
Pt aware that her medication has been sent to [facility] in [place].

## 2011-12-30 NOTE — Telephone Encounter (Signed)
Spoke with pharmacy-Dx code given of 496 (COPD) and they explained difference to patient in Part B and Part D Medicare.

## 2011-12-30 NOTE — Telephone Encounter (Signed)
Error- dup. Msg. Rebecca [name]

## 2012-01-06 ENCOUNTER — Other Ambulatory Visit: Payer: Self-pay | Admitting: *Deleted

## 2012-01-06 MED ORDER — HYDROCODONE-ACETAMINOPHEN 5-500 MG PO TABS: 1.0000 | ORAL_TABLET | Freq: Three times a day (TID) | ORAL | Status: DC | PRN

## 2012-01-13 ENCOUNTER — Other Ambulatory Visit: Payer: Self-pay | Admitting: Pulmonary Disease

## 2012-03-17 ENCOUNTER — Telehealth (INDEPENDENT_AMBULATORY_CARE_PROVIDER_SITE_OTHER): Payer: Self-pay | Admitting: Surgery

## 2012-03-17 NOTE — Telephone Encounter (Signed)
03/17/12 mailed recall letter to pt to call [phone] to schedule a bariatric surgery follow-up appt with Dr. Daphine Deutscher. (lss)

## 2012-04-01 ENCOUNTER — Other Ambulatory Visit: Payer: Self-pay | Admitting: Pulmonary Disease

## 2012-05-03 ENCOUNTER — Ambulatory Visit: Payer: Medicare Other | Admitting: Adult Health

## 2012-05-05 ENCOUNTER — Encounter: Payer: Self-pay | Admitting: Adult Health

## 2012-05-05 ENCOUNTER — Other Ambulatory Visit (INDEPENDENT_AMBULATORY_CARE_PROVIDER_SITE_OTHER): Payer: Medicare Other

## 2012-05-05 ENCOUNTER — Ambulatory Visit (INDEPENDENT_AMBULATORY_CARE_PROVIDER_SITE_OTHER): Payer: Medicare Other | Admitting: Adult Health

## 2012-05-05 VITALS — BP 134/72 | HR 108 | Temp 98.0°F | Ht 67.0 in | Wt 284.6 lb

## 2012-05-05 DIAGNOSIS — J4489 Other specified chronic obstructive pulmonary disease: Secondary | ICD-10-CM

## 2012-05-05 DIAGNOSIS — K219 Gastro-esophageal reflux disease without esophagitis: Secondary | ICD-10-CM

## 2012-05-05 DIAGNOSIS — E119 Type 2 diabetes mellitus without complications: Secondary | ICD-10-CM

## 2012-05-05 DIAGNOSIS — G4733 Obstructive sleep apnea (adult) (pediatric): Secondary | ICD-10-CM

## 2012-05-05 DIAGNOSIS — E039 Hypothyroidism, unspecified: Secondary | ICD-10-CM | POA: Diagnosis not present

## 2012-05-05 DIAGNOSIS — J449 Chronic obstructive pulmonary disease, unspecified: Secondary | ICD-10-CM

## 2012-05-05 DIAGNOSIS — E8881 Metabolic syndrome: Secondary | ICD-10-CM

## 2012-05-05 LAB — HEPATIC FUNCTION PANEL
Albumin: 4 g/dL (ref 3.5–5.2)
Bilirubin, Direct: 0.1 mg/dL (ref 0.0–0.3)
Total Protein: 7.6 g/dL (ref 6.0–8.3)

## 2012-05-05 LAB — CBC WITH DIFFERENTIAL/PLATELET
Basophils Absolute: 0.2 10*3/uL — ABNORMAL HIGH (ref 0.0–0.1)
Basophils Relative: 1.3 % (ref 0.0–3.0)
Eosinophils Relative: 3.4 % (ref 0.0–5.0)
HCT: 49.7 % — ABNORMAL HIGH (ref 36.0–46.0)
Hemoglobin: 16.4 g/dL — ABNORMAL HIGH (ref 12.0–15.0)
Lymphocytes Relative: 22 % (ref 12.0–46.0)
Lymphs Abs: 3 10*3/uL (ref 0.7–4.0)
Monocytes Relative: 6.2 % (ref 3.0–12.0)
Neutro Abs: 9.2 10*3/uL — ABNORMAL HIGH (ref 1.4–7.7)
RBC: 5.49 Mil/uL — ABNORMAL HIGH (ref 3.87–5.11)
RDW: 14.5 % (ref 11.5–14.6)
WBC: 13.8 10*3/uL — ABNORMAL HIGH (ref 4.5–10.5)

## 2012-05-05 LAB — LIPID PANEL
LDL Cholesterol: 119 mg/dL — ABNORMAL HIGH (ref 0–99)
Total CHOL/HDL Ratio: 6
Triglycerides: 113 mg/dL (ref 0.0–149.0)

## 2012-05-05 LAB — BASIC METABOLIC PANEL
CO2: 28 mEq/L (ref 19–32)
Chloride: 102 mEq/L (ref 96–112)
Creatinine, Ser: 1.3 mg/dL — ABNORMAL HIGH (ref 0.4–1.2)
Glucose, Bld: 111 mg/dL — ABNORMAL HIGH (ref 70–99)

## 2012-05-05 LAB — TSH: TSH: 1.33 u[IU]/mL (ref 0.35–5.50)

## 2012-05-05 MED ORDER — FLUTICASONE-SALMETEROL 500-50 MCG/DOSE IN AEPB: 1.0000 | INHALATION_SPRAY | Freq: Two times a day (BID) | RESPIRATORY_TRACT | Status: DC

## 2012-05-05 MED ORDER — TIOTROPIUM BROMIDE MONOHYDRATE 18 MCG IN CAPS: 18.0000 ug | ORAL_CAPSULE | Freq: Every day | RESPIRATORY_TRACT | Status: DC

## 2012-05-05 NOTE — Progress Notes (Signed)
Subjective:    Patient ID: Rebecca [name], female    DOB: [birth year], 62 y.o.   MRN: [id number]  HPI 62 y/o WF   ~  January 21, 2009:  6 mo f/u & she is very happy w/ her results from LapBand surgery- states she's lost >100#... our scales show plateau ~250-260# & she hasn't been back to Rebecca [name] in >12yr due to the $350 charge per visit she says... as noted- she's stopped the O2 ("don't need it, I'm walking daily & doing well");  she called 9/10 wanting to restart Pred10mg  due to dyspnea but she is off this again & improved... she wanted to change the Zoloft to Wellbutrin but couldn't afford the latter med...  ~  April 11, 2009:  she had stopped all meds... saw TP 04/01/09 w/ COPD exac- Rx'd w/ Avelox, Mucinex, Advair, Spiriva> improved... then symptoms came roaring back x 1wk- congested, wheezing, cough, SOB, etc... she tells me that she heats w/ wood & space heaters... also using "mullin & eucalyptus" rec by a homeopath friend in [place]... hasn't been using her nebulizer, and only taking 600mg  Mucinex Bid... REC> we discussed Depo/ Pred, incr Mucinex w/ fluids, incr NEBS regularly, continue Advair500, Spiriva, etc;  check CXR (COPD, scarring, NAD); & Labs (/all=OK)...  ~  April 30, 2009:  she has mult somatic complaints and "lots of issues w/ breathing"... tired all the time & can't understand why... tells me she passed a kidney stone recently (no med attention)... chest much improved from last OV; on Pred 10mg /d now & wants to wean; seeing Rebecca [name] (eating disorder specialist) to help her not use food & he rec applying for disability... PFT today shows severe airflow obstruction w/ FEV1= 1.51L (55%) & %1sec=59... she wants off Pred.  ~  Jul 15, 2010:  33mo ROV & she has seen TP over the past yr for acute exac- Rx Doxy/ Pred & improved;  She has received disability & will be elig for Medicare this fall she says;  Notes "breathing worse- my air goes in but comes right back out";  O2 sat 77% on  arrival but not using oxygen because "it doesn't help" I explained why it helps but she isn't convinced; she has motorized wheelchair but can't transport it; she mentioned alternative lifestyle introduced by her daughter (child she gave up for adoption as a teen)...  CXR & labs are stable> encouraged to stay on meds regularly (-she hasn't), use O2 regularly (-she didn't), use CPAP nightly (-not using at all), get on diet, get wt down (-no change), increase exercise (-not able she says) & offered PulmRehab (-she declined)...  ~  January 14, 2011:  2mo ROV & she notes her breathing is much worse and "something has to be done", notes that she is on Medicare now;  She is tearful- "I can't do anything, can't go anywhere, no friends, I sleep 15H a day" but she still has her pets & new doberman "[name]" rescue dog; she notes SOB w/ ADLs- worse over the last yr, very poorly conditioned & hasn't tried exercise program & hasn't lost weight (prev declined pulm rehab on mult occas); she has a motorized wheelchair but it is too big & won't fit in her house (uses it when she worked at [employer]), wants a rolling walker w/ seat so she can use it in the house & get from room to room; she saw TP 9/12 & she arranged for [facility] to set up new O2 system but  she says it's way too heavy & not really portable therefore doesn't use it much- needs better port system like she has seen on the internet; also [facility] didn't come to check/ service/ replace old CPAP unit but she really needs new sleep study first;  Also c/o CP- her same atypCP that she has had, sharp/ sore/ hurts to breathe or cough, & Aleve helps;  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 which would be multifactorial from her COPD/Emphysema, OSA, Obesity;  SEE PREV STUDIES BELOW> we discussed:  2DEcho,  split night sleep study,  Cards consult w/ Rebecca [name],  PulmRehab program somewhere (she prefers [place] or [place])  ~  June 08, 2011:  52mo ROV & she did not pursue any of the  diagnostic or therapeutic interventions we mentioned last OV (she refused 2DEcho, split night sleep study, formal pulm rehab program- "I can't afford the 20% copay"); her CC today is that she has a hard time breathing thru her nose (prob dev septum) & she wants ENT referral- OK;  She has Home O2, Advair500- Bid, Spiriva, Mucinex- 2Bid w/ Fluids, ProairHFA rescue inhaler;  She has the Helios liq O2 system but doesn't like this either- ?it "cakes" the phlegm in her nose?  She has Lasix40mg  taking 1-2 daily but only using as needed for swelling she says;  She has Vicodin, Anaprox, Ultram, Flexeril for various pains and muscle spasm (she says she gets severe musc spasms every time she uses the Flutter valve to aide in mucocilliary clearance);  She notes that the Flexeril 5mg  Tid is "really great- it helps me breathe better");  She has Zoloft 100mg /d for depression and Alprazolam 0.5mg  Tid prn anxiety...  I have reiterated the need for sleep study/ CPAP, 2DEcho to eval for PulmHTN, and formal PulmRehab program as one of the most important interventions that will help her breathing etc;  She notes that she has to stop when ambulating from the bathroom to the kitchen, I feel pulm rehab is critically important to her, she declines & will not consider this intervention...  Today she wants ENT referral for her nose & wants another Endocrine referral for her Thyroid (recall prev insistence that she had thyroid dysfunction despite all normal TFTs & finally got an Endocrine in [place] (Rebecca [name]) to prescribe thyroid hormone for her at her request)... Review of EPIC> last labs were 5/12 & these need updating... CXR 3/13 showed borderline heart size, sl incr interstitial markings & left basilar Atx, lungs otherw clear, NAD, degen changes in TSpine...  ~  December 08, 2011:  102mo ROV & Rebecca [name] saw Rebecca [name] for Endocrine 4/13 but she is quite upset as he never addressed her Thyroid, just told her she had DM (A1c was 7.1) & prescribed  Metformin500mg /d which she refused to fill & will not follow up w/ him; I offered another Endocrine eval, but she is thinking she might just ret to Rebecca [name] in [place], she will let me know... We discussed "abnormal glucose tolerance" as a pre-diabetic diagnosis & the need for low carb wt reducing diet ...    She has had a severe emotional upset over the last 20mo> her sister Erskine Squibb has been trying to get her evicted from her home (deceased mother's house that she gets to stay in during her life per Trust arrangement); Rebecca [name] has been helping her w/ eating disorder & anxiety/depression & she tells me that she doesn't need meds- her alternative lifestyle functions to release endorphins that in turn stimulate her  endocrine system to prevent depression she says...    Recall that [name] wanted to operate on her nose/sinuses/upper airway but we encouraged caution due to her severe airway dis/ COPD; she enrolled in [facility] but found this "a waste of time & money" as it was 53miles,/ $60/wk & she barely got to do any exercise on the machines; it was a greater effort to get to the rehab dept than to exercise when there, and she felt that she did more exercise at home doing her cleaning, cooking, laundry, bathing, caring for dogs, etc; she even painted a room at home (but has someone do her yard work); she feels the prob is that the nasal cannula doesn't help due to nasal obstruction & putting cannula in mouth is only a temp fix; we discussed the poss to transtracheal catheter & she is interested in pursuing this approach...     She further notes that she drinks a liver detoxer and 1 gal of home carbonated water per day "I belch & it makes room for me to eat"; weight is up 9# to 289# and we reviewed low carb, low fat, wt reducing diet...    We reviewed prob list, meds, xrays and labs> see below for updates >> she declines flu vaccine...   05/05/2012 Follow up  Patient returns for a six-month followup. Patient continues  to complain of fatigue, and shortness, of breath with minimal activity. She does wear her oxygen on and off, however, complains that she continues to have trouble with Nasal stuffiness, but the oxygen. She is pleased been seen by ENT, who has recommended, nasal surgery for deviated nasal septum. However, due to her co-morbidities she was not able to undergo the surgery. She denies any exertional chest pain, hemoptysis, orthopnea, weight loss, abdominal pain, vomiting, or increased edema. She is fasting today.          Problem List:  OBSTRUCTIVE SLEEP APNEA (ICD-327.23) - sleep study 2005 showed RDI=15 w/ desat to 78%, loud snoring & +leg jerks> on CPAP 10, prev used intermittently but now not at all... ~  11/12:  She has agreed to a new Sleep Study- split night protocol, to recheck her OSA & determine CPAP needs (check ABG if poss too)==> she never followed thru w/ the study.  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 supposed to be on NEBULIZER w/ XOPENEX 1.25mg  regularly (hasn't used), ADVAIR 500Bid + SPIRIVA daily (she freq stops all meds) ==> she had a second opinion consult at [facility] by DrAdair in 2006... ~  A1AT level is normal 218 (83-200) 3/09... ~  baseline CXR w/ borderline Cor, COPD, interstitial scarring/ atelectasis/ obesity... ~  PFTs 3/07 showed FVC=2.74 (82%), FEV1=1.67 (62%), %1sec=61, mid-flows=27%pred... improved from 2005. ~  CTChest in 2007 & 4/09 showed severe centrilob emyphysema, + interstitial fibrosis, sm HH, left renal cyst... neg for PE... ~  2010:  breathing improved w/ weight reduction after Lap-band surg... ~  2/11: COPD exac w/ neg CXR (chr changes, NAD), Rx w/ Depo/ Pred/ Avelox/ Mucinex/ NEBS/ Advair/ Spiriva/ etc... ~  PFTs 3/11 showed FVC= 2.57 (73%), FEV1= 1.51 (55%), %1sec=59, mid-flows= 21%pred. ~  Noncompliant w/ Rx & O2 thru 2012... On disability since 2011 & finally got Medicare coverage 9/12... ~  11/12: presents w/  worsening breathing & dyspnea w/ ADLs ==> we outlined further eval w/ 2DEcho & Sleep Study; plus Rx w/ NEBS QID, Advair500Bid, Spiriva daily, Mucinex, Oxygen, Lasix, Zoloft, Xanax, etc (she declined to  proceed w/ the sleep study, 2DEcho, etc)... ~  4/13: she is c/o the nasal O2 not helping since it's hard to breathe thru nose & wants ENT referral==> saw [name] who wanted to do surg but she has severe COPD & hi risk; asked to start PulmRehab & she agrees... ~  10/13: she did PulmRehab for 51mo 6-7/13 but quit since it was "fruitless"- barely got to do any exercise since it was so difficult getting to the facility,etc; still c/o nasal problems limiting her benefit from O2 & we discussed the poss of transtracheal oxygen  ATYPICAL CHEST PAIN (ICD-786.50) - eval by DrWall in 2009. ~  baseline EKG w/ NSR, PVC's, NSSTTWA, NAD... ~  2DEcho 2/05 showed mild MR/ TR, norm LA/ RV, EF=50-55%... ~  NuclearStressTest 4/09 was norm- no ischemia, EF=55%... similiar to prev studies at [facility] 2005 & 2007...  VENOUS INSUFFICIENCY, CHRONIC (ICD-459.81) - Hx chr ven insuffic w/ edema... on LASIX 40mg - 1-2 daily (she self medicates)...  METABOLIC SYNDROME X (ICD-277.7) - she is on diet alone (refused Metformin therapy)... ~  labs 9/08 showed BS=120... FBS 5/08 was 93, HgA1c=7.1 ~  labs 5/10 showed BS= 182, A1c= 6.4 ~  labs 2/11 showed BS= 76, A1c= 6.5 ~  Labs 5/12 showed BS= 103, A1c= 6.5 ~  She saw Rebecca [name] 4/13 w/ A1c=7.1 but she rejects the diagnosis of Diabetes & refused Metform500mg /d... ~  10/13:  We discussed IMPAIRED GLUCOSE TOLERANCE & need for low carb wt reducing diet and incr exercise...  MORBID OBESITY (ICD-278.01) - she prev saw Rebecca [name] for counselling about her eating disorder... ~  max weight ~340# before lap band surg 7/09 by DrMartin. ~  weight 12/09 = 291# ~  weight 5/10 = 265# ~  weight 11/10- = 254# ~  weight 2/11 = 260# ~  Weight 5/12= 290# ~  Weight 11/12 = 286# ~  Weight 4/13 = 280# ==>  BMI=45 ~  Weight 10/13 = 289#  ? Hx HYPOTHYROIDISM (ICD-244.9) - off thyroid meds since 1/10... she was started on Synthroid and followed by DrRSmith in [place] & last seen 7/09- note reviewed... she refuses f/u by Rebecca [name]- we will recheck TSH off Synthroid Rx... ~  labs 5/10 off Synthroid since 1/10 showed TSH= 1.13 ~  labs 2/11 showed TSH= 1.82 ~  Labs 5/12 showed TSH= 1.81 ~  2013: She wanted to restart Thyroid hormone but TSH remains normal> referred to Endocrine for further eval but she was upset w/ Rebecca [name]'s eval; she will decide if she wants   GERD (ICD-530.81) - prev on Nexium but off all meds since 1/10... IRRITABLE BOWEL SYNDROME (ICD-564.1) - colonoscopy 1982 by DrPatterson was WNL.Marland KitchenMarland Kitchen  ?KIDNEY STONE Hx >> pt said she passed a kidney stone 3/11 w/o any medical attention, w/o work up or proof of same; entry made here for future reference if needed.  UTERINE CANCER, HX OF (ICD-V10.42) - s/p laparoscopic hysterectomy & BSO 7/07 by DrSkinner at [place]...   DEGENERATIVE JOINT DISEASE (ICD-715.90) - on VICODIN tid prn,  ANAPROX 220mg  Bid prn, ULTRAM 50mg  Prn... ? 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 she was on Wellbutrin & Vicodin when last seen in 2006... VITAMIN D DEFICIENCY (ICD-268.9) - Vit D level 5/10 = 16... rec> start OTC Vit D 2000 u daily.  Hx of HEADACHE (ICD-784.0)  DYSTHYMIA (ICD-300.4) - off Celexa,  off Zoloft, and on ALPRAZOLAM 0.5mg Tid as needed... she has seen psychiatry  in the past (DrBarnett in [place]) and was Dx w/ seasonal affective disorder... prev seeing psychologist Rebecca [name] for eating disorder counselling... ~  4/13: she mentioned seeing Rebecca [name] again for eating disorder & counseling for depression etc...   Past Surgical History  Procedure Laterality Date  . 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 Appendectomy    . Cholecystectomy  1982  . Laparoscopic hysterectomy   [place]    [place]  . Bilateral sal[place]oophorectomy  2007    [place]  . Laparoscopic gastric banding  08/2007    Dr. Daphine Deutscher    Outpatient Encounter Prescriptions as of 05/05/2012  Medication Sig Dispense Refill  . albuterol (PROAIR HFA) 108 (90 BASE) MCG/ACT inhaler Inhale 2 puffs into the lungs every 6 (six) hours as needed.  1 Inhaler  5  . albuterol (PROVENTIL) (2.5 MG/3ML) 0.083% nebulizer solution TAKE 3 MLS (2.5 MG TOTAL) BY NEBULIZATION EVERY 6 (SIX) HOURS AS NEEDED FOR WHEEZING.  75 mL  5  . ALPRAZolam (XANAX) 0.5 MG tablet TAKE 1/2 TO 1 TABLET BY MOUTH 3 TIMES DAILY AS NEEDED FOR NERVES. NOT TO EXCEED 3 PER DAY  90 tablet  5  . cetirizine (ZYRTEC) 10 MG tablet Take 10 mg by mouth daily.        . clotrimazole-betamethasone (LOTRISONE) cream Apply topically 2 (two) times daily.  30 g  1  . cyclobenzaprine (FLEXERIL) 5 MG tablet Take 1 tablet (5 mg total) by mouth 3 (three) times daily as needed for muscle spasms.  90 tablet  5  . Dextromethorphan-Guaifenesin (MUCINEX DM MAXIMUM STRENGTH) 60-1200 MG per 12 hr tablet Take 1 tablet by mouth daily as needed.        . Fluticasone-Salmeterol (ADVAIR DISKUS) 500-50 MCG/DOSE AEPB Inhale 1 puff into the lungs 2 (two) times daily.  180 each  3  . furosemide (LASIX) 40 MG tablet Take 1 to 2 tabs by mouth once daily as needed for swelling  60 tablet  11  . HYDROcodone-acetaminophen (VICODIN) 5-500 MG per tablet Take 1 tablet by mouth 3 (three) times daily as needed for pain.  90 tablet  5  . naproxen sodium (ANAPROX) 220 MG tablet Take 2 tabs by mouth once daily       . tiotropium (SPIRIVA) 18 MCG inhalation capsule Place 1 capsule (18 mcg total) into inhaler and inhale daily.  90 capsule  3  . traMADol (ULTRAM) 50 MG tablet TAKE 1 TABLET BY MOUTH EVERY 6 HOURS AS NEEDED  30 tablet  0  . traMADol (ULTRAM) 50 MG tablet TAKE 1 TABLET BY MOUTH EVERY 6 HOURS AS NEEDED  30 tablet  0   No facility-administered encounter medications on file as of 05/05/2012.     Allergies  Allergen Reactions  . Penicillins     REACTION: nausea and vomiting    Current Medications, Allergies, Past Medical History, Past Surgical History, Family History, and Social History were reviewed in [employer] record.   Review of Systems        Constitutional:   No  weight loss, night sweats,  Fevers, chills, + fatigue, or  lassitude.  HEENT:   No headaches,  Difficulty swallowing,  Tooth/dental problems, or  Sore throat,                No sneezing, itching, ear ache, nasal congestion, post nasal drip,   CV:  No chest pain,  Orthopnea, PND, swelling in lower extremities, anasarca, dizziness, palpitations, syncope.  GI  No heartburn, indigestion, abdominal pain, nausea, vomiting, diarrhea, change in bowel habits, loss of appetite, bloody stools.   Resp:    No chest wall deformity  Skin: no rash or lesions.  GU: no dysuria, change in color of urine, no urgency or frequency.  No flank pain, no hematuria   MS:  No joint pain or swelling.  No decreased range of motion.  No back pain.  Psych:  No change in mood or affect. No depression or anxiety.  No memory loss.       Objective:   Physical Exam    WD, Morbidly Obese, 62 y/o WF in NAD... GENERAL:  Alert & oriented; pleasant & cooperative; HEENT:  Britt/AT, EOM-wnl, PERRLA, EACs-clear, TMs-wnl, NOSE-clear, THROAT-clear & wnl. NECK:  Supple w/ fairROM; no JVD; normal carotid impulses w/o bruits; no thyromegaly or nodules palpated; no lymphadenopathy. CHEST:  decr BS bilat; HEART:  Regular Rhythm; without murmurs/ rubs/ or gallops heard..tr edema.  ABDOMEN:  Obese, soft & nontender; normal bowel sounds; no organomegaly or masses detected. EXT: without deformities, mod arthritic changes; no varicose veins/ +venous insuffic/trace edema. NEURO: no focal neuro deficits... DERM: no rash etc...   Assessment & Plan:

## 2012-05-05 NOTE — Patient Instructions (Addendum)
Can add Chlortrimeton 4mg  2 at bedtime.  Saline nasal rinse and gel As needed   Continue on current regimen.  Low salt diet  Low fat cholesterol diet  I will call with labs.  follow up Dr. [name]  In 6 months and As needed

## 2012-05-09 ENCOUNTER — Ambulatory Visit: Payer: Medicare Other

## 2012-05-09 NOTE — Assessment & Plan Note (Signed)
Continue with current regimen. Encouraged on weight loss, and dietary changes. Labs are pending.

## 2012-05-09 NOTE — Assessment & Plan Note (Addendum)
Continue on current regimen without flare Patient continues to desaturate with walking.  She is quite limited in her ability to be able to exercise. Her weight has continued to rise, despite previous lap band surgery

## 2012-05-09 NOTE — Assessment & Plan Note (Signed)
Intolerant to CPAP. Continue on nocturnal oxygen. Personal, weight loss.

## 2012-05-16 NOTE — Progress Notes (Signed)
Quick Note:  ATC pt x3 (with- and without the area code); line busy each time. WCB. ______

## 2012-05-16 NOTE — Progress Notes (Signed)
Quick Note:  ATC pt x3 (with- and without the area code), line busy each time. WCB. ______

## 2012-05-23 ENCOUNTER — Telehealth: Payer: Self-pay | Admitting: Pulmonary Disease

## 2012-05-23 DIAGNOSIS — M545 Low back pain: Secondary | ICD-10-CM

## 2012-05-23 NOTE — Telephone Encounter (Signed)
ATC patient x 3. [employer] Will call back

## 2012-05-24 NOTE — Telephone Encounter (Signed)
lmomtcb for pt 

## 2012-05-24 NOTE — Telephone Encounter (Signed)
Pt returned triage's call.  Holly D Pryor ° °

## 2012-05-24 NOTE — Telephone Encounter (Signed)
lmomtcb x1 for pt 

## 2012-05-25 NOTE — Telephone Encounter (Signed)
ATC pt at # provided above - line busy x 4 WCB. This is the only # listed for pt

## 2012-05-25 NOTE — Telephone Encounter (Signed)
Per SN----   [facility] is no longer together.    recs for LBP for her to see Dr. Turner Daniels or Dr. Renae Fickle at [facility].  thanks

## 2012-05-25 NOTE — Telephone Encounter (Signed)
Called and spoke with the pt to see what's going on with her foot She states approx 1 wk ago started having cramping around her left ankle This turned into swelling and pain in her foot to the point where should could not bare weight on it Since she called this am regarding this, her pain and swelling have resolved! She states that what helped was warm heat to her neck Now she is thinking that something is wrong with her spine, has occ back pain  Wants referral to ortho She has seen someone at [facility] before, does not recall the name, but did not care for him SN, please advise, thanks! Last ov 05/05/12 with TP Next ov with SN 11/04/12

## 2012-05-25 NOTE — Telephone Encounter (Signed)
Order has been placed for referral. Tried calling pt to let her know but I got a busy signal.  Will try later.

## 2012-05-26 NOTE — Telephone Encounter (Signed)
Line busy will try later

## 2012-05-26 NOTE — Telephone Encounter (Signed)
When calling pt you have to dial area code.   lmtcb x1

## 2012-05-26 NOTE — Telephone Encounter (Signed)
Pt returned call. Kathleen W Perdue  

## 2012-05-27 ENCOUNTER — Other Ambulatory Visit: Payer: Self-pay | Admitting: Adult Health

## 2012-05-27 NOTE — Telephone Encounter (Signed)
lmomtcb x1 for pt 

## 2012-05-30 NOTE — Telephone Encounter (Signed)
LMTCB

## 2012-05-31 NOTE — Telephone Encounter (Signed)
LMTC x 1  

## 2012-06-01 ENCOUNTER — Encounter: Payer: Self-pay | Admitting: *Deleted

## 2012-06-01 NOTE — Progress Notes (Signed)
Quick Note:  ATC x3, line busy. Will send results letter to pt's home address. ______

## 2012-06-01 NOTE — Telephone Encounter (Signed)
Per referral note pt has been advised as follows: This ortho office has seen this pt before and she has an oustanding balance so pt has to call them to schedule this appt pt is aware [name]

## 2012-06-08 ENCOUNTER — Telehealth: Payer: Self-pay | Admitting: Pulmonary Disease

## 2012-06-08 NOTE — Telephone Encounter (Signed)
ATC pt line busy x 3 wcb 

## 2012-06-09 NOTE — Telephone Encounter (Signed)
lmomtcb  

## 2012-06-10 NOTE — Telephone Encounter (Signed)
ATCx3 line busy. WCB. Carron Curie, CMA

## 2012-06-13 NOTE — Progress Notes (Signed)
Quick Note:  Pt returned call. Advised of lab results / recs as stated by TP. Pt verbalized her understanding and denied any questions. ______ 

## 2012-06-13 NOTE — Progress Notes (Signed)
Quick Note:  Patient returned call. Advised of lab results / recs as stated by TP. Pt verbalized her understanding and denied any questions. ______

## 2012-06-13 NOTE — Telephone Encounter (Signed)
ATC line busy 

## 2012-06-14 NOTE — Telephone Encounter (Signed)
lmtcb x1 

## 2012-06-15 NOTE — Telephone Encounter (Signed)
msg closed by accident Will route to triage to ensure f/u

## 2012-06-15 NOTE — Telephone Encounter (Signed)
ATC pt at # provided above - line busy x 3.  WCB. This is the only contact # we have for pt

## 2012-06-21 ENCOUNTER — Other Ambulatory Visit: Payer: Self-pay | Admitting: Pulmonary Disease

## 2012-06-29 ENCOUNTER — Other Ambulatory Visit: Payer: Self-pay | Admitting: Pulmonary Disease

## 2012-06-29 MED ORDER — HYDROCODONE-ACETAMINOPHEN 5-325 MG PO TABS: 1.0000 | ORAL_TABLET | Freq: Three times a day (TID) | ORAL | Status: DC | PRN

## 2012-07-15 ENCOUNTER — Other Ambulatory Visit: Payer: Self-pay | Admitting: Pulmonary Disease

## 2012-07-15 MED ORDER — TRAMADOL HCL 50 MG PO TABS: ORAL_TABLET | ORAL | Status: DC

## 2012-08-30 ENCOUNTER — Telehealth: Payer: Self-pay | Admitting: Pulmonary Disease

## 2012-08-30 NOTE — Telephone Encounter (Signed)
ATC x 3 line busy

## 2012-08-31 NOTE — Telephone Encounter (Signed)
Pt called back. I made her aware of this. She voiced her understanding and needed nothing further.

## 2012-08-31 NOTE — Telephone Encounter (Signed)
I called Rebecca [name] to access and was advised that on 05/18/12 they sent her a sign discloser letter with some missing information they needed back from her but never received this back. Per BOB they can resend this to her if need be but pt needs to call them at [phone]  [facility] X1  FOR PT---need to dial area code when dialing pt phone #

## 2012-09-05 ENCOUNTER — Telehealth: Payer: Self-pay | Admitting: Pulmonary Disease

## 2012-09-05 NOTE — Telephone Encounter (Signed)
ATC - Line busy - Will try again later

## 2012-09-05 NOTE — Telephone Encounter (Signed)
ATC - Line busy - No other # listed - Will try back later

## 2012-09-06 NOTE — Telephone Encounter (Signed)
ATC x 2 line busy both times [facility]

## 2012-09-07 NOTE — Telephone Encounter (Signed)
ATC - Line Busy - No contact number listed in chart - Spoke with [name] Carwin (emergency contact [phone]) and requested if she can contact pt to have her give our office a call.

## 2012-09-08 NOTE — Telephone Encounter (Signed)
Per SN---   This could be a disc in her neck to cause the arm pain to be this severe.   Needs ASAP appt with ortho---either [facility] or Dr. Otelia Sergeant to eval--can see anyone in either practice if they can get her in.  Refer to ortho for arm, neck and back pain ASAP.  If severe and cant get in with ortho soon, may need to be seen in the Er for eval.  Call pt and offer percocet 5 mg   #90  1 po tid no refill, but she will have to come and pick this rx up.  thanks

## 2012-09-08 NOTE — Telephone Encounter (Signed)
LMOMTCB x 1 

## 2012-09-08 NOTE — Telephone Encounter (Signed)
DIAL 336 prior to calling home number  Spoke with patient patient very tearful and upset that she has been in pain for "24 hours"  I apologized for the delay and explained that we were not immediately aware that 336 had to be dialed prior to calling number Patient c/o arm hurting "so bad I cant even pick up my pills," leg swelling, neck and back pain,  Patient questioning is this a nerve issue and also states that all this pain is causing bad SOB States tramadol is not helping and the hydro-codone only makes her itch all over Patient requesting recs from Dr. [name]-- I stated I would send msg to Dr. [name] and call back with recs Prior to ending phone conversation patient states " I wish I would just f-- die so I would not be in this pain and would not have to bother you all" Please advise Dr. [name], thank you!~  Last OV: 05/05/12 w TP no f/u scheduled at this time  Current Outpatient Prescriptions on File Prior to Visit  Medication Sig Dispense Refill  . albuterol (PROAIR HFA) 108 (90 BASE) MCG/ACT inhaler Inhale 2 puffs into the lungs every 6 (six) hours as needed.  1 Inhaler  5  . albuterol (PROVENTIL) (2.5 MG/3ML) 0.083% nebulizer solution TAKE 3 MLS (2.5 MG TOTAL) BY NEBULIZATION EVERY 6 (SIX) HOURS AS NEEDED FOR WHEEZING.  75 mL  5  . ALPRAZolam (XANAX) 0.5 MG tablet TAKE 1/2 TO 1 TABLET BY MOUTH 3 TIMES DAILY AS NEEDED FOR NERVES. NOT TO EXCEED 3 PER DAY  90 tablet  5  . cetirizine (ZYRTEC) 10 MG tablet Take 10 mg by mouth daily.        . Cholecalciferol (VITAMIN D) 400 UNITS capsule Take 400 Units by mouth daily.      . clotrimazole-betamethasone (LOTRISONE) cream Apply topically 2 (two) times daily.  30 g  1  . Dextromethorphan-Guaifenesin (MUCINEX DM MAXIMUM STRENGTH) 60-1200 MG per 12 hr tablet Take 1 tablet by mouth daily as needed.        . Fluticasone-Salmeterol (ADVAIR DISKUS) 500-50 MCG/DOSE AEPB Inhale 1 puff into the lungs 2 (two) times daily.  180 each  3  . furosemide (LASIX)  40 MG tablet TAKE 1 TO 2 TABS BY MOUTH ONCE DAILY AS NEEDED FOR SWELLING  60 tablet  6  . HYDROcodone-acetaminophen (NORCO/VICODIN) 5-325 MG per tablet Take 1 tablet by mouth 3 (three) times daily as needed for pain.  90 tablet  4  . Krill Oil 500 MG CAPS Take 1 capsule by mouth daily.      . Multiple Vitamin (MULTIVITAMIN) tablet Take 1 tablet by mouth daily.      . naproxen sodium (ANAPROX) 220 MG tablet Take 2 tabs by mouth once daily       . tiotropium (SPIRIVA) 18 MCG inhalation capsule Place 1 capsule (18 mcg total) into inhaler and inhale daily.  90 capsule  3  . traMADol (ULTRAM) 50 MG tablet TAKE 1 TABLET BY MOUTH EVERY 6 HOURS AS NEEDED  30 tablet  0  . traMADol (ULTRAM) 50 MG tablet TAKE 1 TABLET BY MOUTH TID   AS NEEDED for pain  90 tablet  5   No current facility-administered medications on file prior to visit.   Allergies  Allergen Reactions  . Penicillins     REACTION: nausea and vomiting

## 2012-09-09 NOTE — Telephone Encounter (Signed)
ATC patient, no answer LMOMTCB 

## 2012-09-12 NOTE — Telephone Encounter (Signed)
I spoke with the pt and advised of options. She states she does not have any gas to come to [place] to pick up rx and does not want referral to another doctor because she does not have the money for that either. Pt states she will just go to the ER. Carron Curie, CMA

## 2012-09-16 ENCOUNTER — Telehealth: Payer: Self-pay | Admitting: Pulmonary Disease

## 2012-09-16 DIAGNOSIS — J449 Chronic obstructive pulmonary disease, unspecified: Secondary | ICD-10-CM

## 2012-09-16 MED ORDER — OXYCODONE-ACETAMINOPHEN 5-325 MG PO TABS: 1.0000 | ORAL_TABLET | Freq: Three times a day (TID) | ORAL | Status: DC | PRN

## 2012-09-16 NOTE — Telephone Encounter (Signed)
Per phone note from 09-05-12:  Needs ASAP appt with ortho---either [facility] or Dr. Otelia Sergeant to eval--can see anyone in either practice if they can get her in. Refer to ortho for arm, neck and back pain ASAP. If severe and cant get in with ortho soon, may need to be seen in the Er for eval. Call pt and offer percocet 5 mg #90 1 po tid no refill, but she will have to come and pick this rx up. thanks      I spoke with the pt and she states she is coming to [place] and wants to pick-up [employer]. Pt does not want ortho eval at this time because she does not have any money and states they are asking for 20% for OV.   Also the pt requests that an order be sent to [facility] for an evaluation for a more portable system. She states she currently has liquid oxygen and she leaves her home so infrequently that it is evaporating before she can use it so she does not have any available when she needs it. Order placed and rx printed. No need to contact pt she is on her way to [place] and will stop by later today. Carron Curie, CMA

## 2012-09-20 MED ORDER — TIOTROPIUM BROMIDE MONOHYDRATE 18 MCG IN CAPS: 18.0000 ug | ORAL_CAPSULE | Freq: Every day | RESPIRATORY_TRACT | Status: DC

## 2012-09-20 NOTE — Telephone Encounter (Signed)
Form has been signed by SN and faxed back.  Forms placed in the scan folder.  Nothing further is needed.

## 2012-10-04 ENCOUNTER — Other Ambulatory Visit: Payer: Self-pay | Admitting: Pulmonary Disease

## 2012-10-04 MED ORDER — ALPRAZOLAM 0.5 MG PO TABS: ORAL_TABLET | ORAL | Status: DC

## 2012-10-11 ENCOUNTER — Telehealth: Payer: Self-pay | Admitting: Pulmonary Disease

## 2012-10-11 NOTE — Telephone Encounter (Signed)
Spoke with Tom at patient assistance-we confirmed address for patient and Rx assistance should arrive at patients home within 7-10 days. ATC patient to let her know this as the patient assistance company has not called her to inform her of this change. Line was busy x 3; will need triage to call patient back in am and inform of this change.

## 2012-10-12 NOTE — Telephone Encounter (Signed)
ACT pt - line busy x 3.  WCB

## 2012-10-13 ENCOUNTER — Telehealth: Payer: Self-pay | Admitting: Pulmonary Disease

## 2012-10-13 NOTE — Telephone Encounter (Signed)
I spoke with pt and is aware. She needed nothing further needed

## 2012-10-13 NOTE — Telephone Encounter (Signed)
Spoke with pt - She has seen [facility].  Reports her breathing meds have not been changed in 10 yrs.  Would like to know if this would be more helpful in her breathing.  Dr. [name], pls advise.  Thank you.  Allergies  Allergen Reactions  . Penicillins     REACTION: nausea and vomiting

## 2012-10-13 NOTE — Telephone Encounter (Signed)
LMTCBx1 to advise the pt that spiriva will be delivered to her home, not to the office. Carron Curie, CMA

## 2012-10-14 MED ORDER — FLUTICASONE FUROATE-VILANTEROL 100-25 MCG/INH IN AEPB: 1.0000 | INHALATION_SPRAY | Freq: Every day | RESPIRATORY_TRACT | Status: DC

## 2012-10-14 NOTE — Telephone Encounter (Signed)
ATC patient x2 - line busy.  WCB.

## 2012-10-14 NOTE — Telephone Encounter (Signed)
Per SN: this is ok.  Ok to give sample and rx for Breo 1 puff once daily and rinse mouth thoroughly afterward.  Thanks.  LMOM TCB x1 to inform pt of sample >> need pharmacy name for rx. Sample left up front for pt to pick up at her convenience; sample documented per protocol.

## 2012-10-17 NOTE — Telephone Encounter (Signed)
ATC patient x 2 Line busy, [facility]

## 2012-10-18 MED ORDER — FLUTICASONE FUROATE-VILANTEROL 100-25 MCG/INH IN AEPB: 1.0000 | INHALATION_SPRAY | Freq: Every day | RESPIRATORY_TRACT | Status: AC

## 2012-10-18 NOTE — Telephone Encounter (Signed)
I spoke with the pt and pharmacy verified. Carron Curie, CMA

## 2012-11-04 ENCOUNTER — Ambulatory Visit (INDEPENDENT_AMBULATORY_CARE_PROVIDER_SITE_OTHER): Payer: Medicare Other | Admitting: Pulmonary Disease

## 2012-11-04 ENCOUNTER — Encounter: Payer: Self-pay | Admitting: Pulmonary Disease

## 2012-11-04 VITALS — BP 120/70 | HR 86 | Temp 98.5°F | Ht 66.0 in | Wt 280.6 lb

## 2012-11-04 DIAGNOSIS — R7309 Other abnormal glucose: Secondary | ICD-10-CM

## 2012-11-04 DIAGNOSIS — K219 Gastro-esophageal reflux disease without esophagitis: Secondary | ICD-10-CM

## 2012-11-04 DIAGNOSIS — F341 Dysthymic disorder: Secondary | ICD-10-CM

## 2012-11-04 DIAGNOSIS — I872 Venous insufficiency (chronic) (peripheral): Secondary | ICD-10-CM | POA: Diagnosis not present

## 2012-11-04 DIAGNOSIS — E559 Vitamin D deficiency, unspecified: Secondary | ICD-10-CM

## 2012-11-04 DIAGNOSIS — R7302 Impaired glucose tolerance (oral): Secondary | ICD-10-CM

## 2012-11-04 DIAGNOSIS — M1990 Unspecified osteoarthritis, unspecified site: Secondary | ICD-10-CM

## 2012-11-04 DIAGNOSIS — G4733 Obstructive sleep apnea (adult) (pediatric): Secondary | ICD-10-CM

## 2012-11-04 DIAGNOSIS — K589 Irritable bowel syndrome without diarrhea: Secondary | ICD-10-CM

## 2012-11-04 DIAGNOSIS — J449 Chronic obstructive pulmonary disease, unspecified: Secondary | ICD-10-CM | POA: Diagnosis not present

## 2012-11-04 DIAGNOSIS — E8881 Metabolic syndrome: Secondary | ICD-10-CM

## 2012-11-04 NOTE — Patient Instructions (Addendum)
Today we updated your med list in our EPIC system...    Continue your current medications the same...  Use your oxygen w/ all activities and at night...  Let me know when you want to see the Orthopedist about your knee pain...    In the meanwhile you have the Naprosyn, Tramadol, Hydrocodone for as needed use...  Keep up the good work w/ healthy eating & weight reduction!!!

## 2012-11-05 ENCOUNTER — Encounter: Payer: Self-pay | Admitting: Pulmonary Disease

## 2012-11-05 NOTE — Progress Notes (Signed)
Patient ID: Rebecca [name], female   DOB: 15-Dec-1950, 62 y.o.   MRN: [id number]  Subjective:    Patient ID: Rebecca [name], female    DOB: [birth year], 62 y.o.   MRN: [id number]  HPI 62 y/o WF known to me w/ mult med problems as noted below...  ~  Jul 15, 2010:  2mo ROV & she has seen TP over the past yr for acute exac- Rx Doxy/ Pred & improved;  She has received disability & will be elig for Medicare this fall she says;  Notes "breathing worse- my air goes in but comes right back out";  O2 sat 77% on arrival but not using oxygen because "it doesn't help" I explained why it helps but she isn't convinced; she has motorized wheelchair but can't transport it; she mentioned alternative lifestyle introduced by her daughter (child she gave up for adoption as a teen)...  CXR & labs are stable> encouraged to stay on meds regularly (-she hasn't), use O2 regularly (-she didn't), use CPAP nightly (-not using at all), get on diet, get wt down (-no change), increase exercise (-not able she says) & offered PulmRehab (-she declined)...  ~  January 14, 2011:  77mo ROV & she notes her breathing is much worse and "something has to be done", notes that she is on Medicare now;  She is tearful- "I can't do anything, can't go anywhere, no friends, I sleep 15H a day" but she still has her pets & new doberman "[name]" rescue dog; she notes SOB w/ ADLs- worse over the last yr, very poorly conditioned & hasn't tried exercise program & hasn't lost weight (prev declined pulm rehab on mult occas); she has a motorized wheelchair but it is too big & won't fit in her house (uses it when she worked at [employer]), wants a rolling walker w/ seat so she can use it in the house & get from room to room; she saw TP 9/12 & she arranged for [facility] to set up new O2 system but she says it's way too heavy & not really portable therefore doesn't use it much- needs better port system like she has seen on the internet; also [facility] didn't come to check/ service/  replace old CPAP unit but she really needs new sleep study first;  Also c/o CP- her same atypCP that she has had, sharp/ sore/ hurts to breathe or cough, & Aleve helps;  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 which would be multifactorial from her COPD/Emphysema, OSA, Obesity;  SEE PREV STUDIES BELOW> we discussed:  2DEcho,  split night sleep study,  Cards consult w/ DrBensimhon,  PulmRehab program somewhere (she prefers [place] or [place])  ~  June 08, 2011:  81mo ROV & she did not pursue any of the diagnostic or therapeutic interventions we mentioned last OV (she refused 2DEcho, split night sleep study, formal pulm rehab program- "I can't afford the 20% copay"); her CC today is that she has a hard time breathing thru her nose (prob dev septum) & she wants ENT referral- OK;  She has Home O2, Advair500- Bid, Spiriva, Mucinex- 2Bid w/ Fluids, ProairHFA rescue inhaler;  She has the [employer]ios liq O2 system but doesn't like this either- ?it "cakes" the phlegm in her nose?  She has Lasix40mg  taking 1-2 daily but only using as needed for swelling she says;  She has Vicodin, Anaprox, Ultram, Flexeril for various pains and muscle spasm (she says she gets severe musc spasms every time she  uses the Flutter valve to aide in mucocilliary clearance);  She notes that the Flexeril 5mg  Tid is "really great- it helps me breathe better");  She has Zoloft 100mg /d for depression and Alprazolam 0.5mg  Tid prn anxiety...  I have reiterated the need for sleep study/ CPAP, 2DEcho to eval for PulmHTN, and formal PulmRehab program as one of the most important interventions that will help her breathing etc;  She notes that she has to stop when ambulating from the bathroom to the kitchen, I feel pulm rehab is critically important to her, she declines & will not consider this intervention...  Today she wants ENT referral for her nose & wants another Endocrine referral for her Thyroid (recall prev insistence that she had thyroid dysfunction  despite all normal TFTs & finally got an Endocrine in [place] ([name]) to prescribe thyroid hormone for her at her request)... Review of EPIC> last labs were 5/12 & these need updating... CXR 3/13 showed borderline heart size, sl incr interstitial markings & left basilar Atx, lungs otherw clear, NAD, degen changes in TSpine...  ~  December 08, 2011:  70mo ROV & Bronson Curb saw DrEllison for Endocrine 4/13 but she is quite upset as he never addressed her Thyroid, just told her she had DM (A1c was 7.1) & prescribed Metformin500mg /d which she refused to fill & will not follow up w/ him; I offered another Endocrine eval, but she is thinking she might just ret to [name] in [place], she will let me know... We discussed "abnormal glucose tolerance" as a pre-diabetic diagnosis & the need for low carb wt reducing diet ...    She has had a severe emotional upset over the last 77mo> her sister Erskine Squibb has been trying to get her evicted from her home (deceased mother's house that she gets to stay in during her life per Trust arrangement); [name] has been helping her w/ eating disorder & anxiety/depression & she tells me that she doesn't need meds- her alternative lifestyle functions to release endorphins that in turn stimulate her endocrine system to prevent depression she says...    Recall that [name] wanted to operate on her nose/sinuses/upper airway but we encouraged caution due to her severe airway dis/ COPD; she enrolled in [facility] but found this "a waste of time & money" as it was 54miles,/ $60/wk & she barely got to do any exercise on the machines; it was a greater effort to get to the rehab dept than to exercise when there, and she felt that she did more exercise at home doing her cleaning, cooking, laundry, bathing, caring for dogs, etc; she even painted a room at home (but has someone do her yard work); she feels the prob is that the nasal cannula doesn't help due to nasal obstruction & putting cannula in mouth is only a temp  fix; we discussed the poss to transtracheal catheter & she is interested in pursuing this approach...     She further notes that she drinks a liver detoxer and 1 gal of home carbonated water per day "I belch & it makes room for me to eat"; weight is up 9# to 289# and we reviewed low carb, low fat, wt reducing diet...    We reviewed prob list, meds, xrays and labs> see below for updates >> she declines flu vaccine...  ~  November 04, 2012:  1mo ROV & Bronson Curb has lost 8# down to 281# today but she continues w/ numerous complaints, nothing is better she says, see below; We  reviewed the following medical problems during today's office visit >>     Hx mild OSA w/ mod desat, loud snoring, & leg jerks (on sleep study 2005)> she tried CPAP10 but stopped on her own & declined to repeat split night study & re-try CPAP rx...    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 1993; supposed to be on HomeO2- 2L rest & 4L exercise, NEBS w/ Albut, Advair500Bid, Spiriva daily, Mucinex1-2Bid but not taking anything regularly; states that Flutter & Mucinex cause "spasms" in her back; states O2 doesn't help since she can't breathe thru her nose (she has been eval by ENT, Haroldine Laws); finally tried [employer] but stopped after 87mo as "fruitless"; states she "can't breathe at all, can't do anything" etc; SOB w/ ADLs=> O2 recert today (see below)... Note> she was eval at [facility], DrAdair in 2006 and last PFT (2011) showed FEV1= 1.5 (55%) & %1sec=59...    Hx AtypCP> baseline EKG w/ NSSTTWA; prev 2DEcho w/ norm LVF, EF=50-55%, mild MR/TR, norm LA/RV; Myoview 2009 normal w/o ischemia & EF=55%...    Ven Insuffic> on Lasix40-2Qam; she knows not to eat sodium, elev legs, wear support hose...    Impaired Gluc Tolerance, Metabolic syndrome> she refused meds; prev eval by DrEllison w/ rec for Metformin Rx but she rejects the DM diagnosis & refused meds; states "I'm eating healthier" (advised low carb low fat), not checking sugars, prev labs w/ A1c=7.1     ?Hypothyroid> not currently on meds but she really wants thyroid Rx; prev TFTs all wnl; she prev had Endocrine in [place] prescribe Synthroid for her but she stopped going; clinically & biochemically euthyroid...    Morbid Obesity> peak weight ~340# before lap band surg 2009 by DrMMartin; lost to ~250# but then back up to 280-90#; refused f/u w/ [facility] due to cost of visits & lap-band adjustments; we reviewed diet/ exercise...    GI- GERD, IBS> she uses OTC PPI as needed; last colonoscopy was 1982 by DrPatterson & wnl, she refuses GI referral & refuses f/u colon etc...    GYN- s/p uterine cancer> s/p lap hyst & BSP 2007 by DrSkinner at [facility]...    DJD, ?FM, VitD defic> on Vicodin, Tramadol, Aleve, VitD, etc; prev eval by DrDeveshwar for rheum; now c/o pain in left knee (she thinks it's "siatica"), can't exercise & wants to see [facility]- ok...    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 We reviewed prob list, meds, xrays and labs> see below for updates >> she take numerous herbs, supplements, etc- often rec to her by her alternative lifestyle acquaintances...    OXYGEN RE-CERTIFICATION:   11/04/12 1.  The pt has a continued need for home oxygen therapy due to her severe COPD/ emphysema... 2.  She is using the oxygen in the home w/ exercise (4L/min), and at night (2L/min)... 3.  O2 sats at rest on RA today=> 92% w/ pulse rate 82/min...  4.  Pt exercised in office on RA => after 2laps O2 sat nadir= 86% w/ pulse rate=105... 5.  Pt placed on O2 at 4L/min via [place] & exercise repeated => after 2laps O2 sat nadir= 92% w/ pulse rate=102           Problem List:    OBSTRUCTIVE SLEEP APNEA (ICD-327.23) - sleep study 2005 showed RDI=15 w/ desat to 78%, loud snoring & +leg jerks> on CPAP  10, prev used intermittently but  now not at all... ~  11/12:  She has agreed to a new Sleep Study- split night protocol, to recheck her OSA & determine CPAP needs (check ABG if poss too)==> she never followed thru w/ the study.  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 supposed to be on NEBULIZER Qid, ADVAIR 500Bid + SPIRIVA daily (she freq stops all meds) ==> she had a second opinion consult at [facility] by DrAdair in 2006... ~  A1AT level is normal 218 (83-200) 3/09... ~  baseline CXR w/ borderline Cor, COPD, interstitial scarring/ atelectasis/ obesity... ~  PFTs 3/07 showed FVC=2.74 (82%), FEV1=1.67 (62%), %1sec=61, mid-flows=27%pred... improved from 2005. ~  CTChest in 2007 & 4/09 showed severe centrilob emyphysema, + interstitial fibrosis, sm HH, left renal cyst... neg for PE... ~  2010:  breathing improved w/ weight reduction after Lap-band surg... ~  2/11: COPD exac w/ neg CXR (chr changes, NAD), Rx w/ Depo/ Pred/ Avelox/ Mucinex/ NEBS/ Advair/ Spiriva/ etc... ~  PFTs 3/11 showed FVC= 2.57 (73%), FEV1= 1.51 (55%), %1sec=59, mid-flows= 21%pred. ~  Noncompliant w/ Rx & O2 thru 2012... On disability since 2011 & finally got Medicare coverage 9/12... ~  11/12: presents w/ worsening breathing & dyspnea w/ ADLs ==> we outlined further eval w/ 2DEcho & Sleep Study; plus Rx w/ NEBS QID, Advair500Bid, Spiriva daily, Mucinex, Oxygen, Lasix, Zoloft, Xanax, etc (she declined to proceed w/ the sleep study, 2DEcho, etc)... 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  ~  4/13: she is c/o the nasal O2 not helping since it's hard to breathe thru nose & wants ENT referral==> saw [name] who wanted to do surg but she has severe COPD & hi risk; asked to start PulmRehab & she agrees... ~  10/13: she did PulmRehab for 67mo 6-7/13 but quit since it was "fruitless"- barely got to do any exercise since it was so difficult getting to the  facility,etc; still c/o nasal problems limiting her benefit from O2 & we discussed the poss of ?transtracheal oxygen? ~  9/14: exsmoker quit 1993; supposed to be on HomeO2- 2L rest & 4L exercise, NEBS w/ Albut, Advair500Bid, Spiriva daily, Mucinex1-2Bid but not taking anything regularly; states that Flutter & Mucinex cause "spasms" in her back; states O2 doesn't help since she can't breathe thru her nose (she has been eval by ENT, Haroldine Laws); finally tried [employer] but stopped after 67mo as "fruitless"; states she "can't breathe at all, can't do anything" etc; SOB w/ ADLs=> O2 recert today (see below)... Note> she was eval at [facility], DrAdair in 2006 and last PFT (2011) showed FEV1= 1.5 (55%) & %1sec=59... OXYGEN RE-CERT done today...  ATYPICAL CHEST PAIN (ICD-786.50) - eval by DrWall in 2009. ~  baseline EKG w/ NSR, PVC's, NSSTTWA, NAD... ~  2DEcho 2/05 showed mild MR/ TR, norm LA/ RV, EF=50-55%... ~  NuclearStressTest 4/09 was norm- no ischemia, EF=55%... similiar to prev studies at [facility] 2005 & 2007...  VENOUS INSUFFICIENCY, CHRONIC (ICD-459.81) - Hx chr ven insuffic w/ edema... on LASIX 40mg - 1-2 daily (she self medicates)...  IMPAIRED GLUCOSE TOLERANCE >>  METABOLIC SYNDROME X (ICD-277.7) - she is on diet alone (refused Metformin therapy)... ~  labs 9/08 showed BS=120... FBS 5/08 was 93, HgA1c=7.1 ~  labs 5/10 showed BS= 182, A1c= 6.4 ~  labs 2/11 showed BS= 76, A1c= 6.5 ~  Labs 5/12 showed BS= 103, A1c= 6.5 ~  She saw DrEllison 4/13 w/ A1c=7.1 but she rejects the diagnosis of Diabetes & refused Metform500mg /d... ~  10/13:  We discussed IMPAIRED GLUCOSE TOLERANCE & need for low carb wt reducing diet and incr exercise... ~  9/14: he refused meds; prev eval by DrEllison w/ rec for Metformin Rx but she rejects the DM diagnosis & refused meds; states "I'm eating healthier" (advised low carb low fat), not checking sugars, prev labs w/ A1c=7.1   MORBID OBESITY (ICD-278.01) - she prev saw [name] for  counselling about her eating disorder... ~  max weight ~340# before lap band surg 7/09 by DrMartin. ~  weight 12/09 = 291# ~  weight 5/10 = 265# ~  weight 11/10- = 254# ~  weight 2/11 = 260# ~  Weight 5/12= 290# ~  Weight 11/12 = 286# ~  Weight 4/13 = 280# ==> BMI=45 ~  Weight 10/13 = 289# ~  Weight 9/14 = 281#  ? Hx HYPOTHYROIDISM (ICD-244.9) - off thyroid meds since 1/10... she was started on Synthroid and followed by DrRSmith in [place] & last seen 7/09- note reviewed... she refuses f/u by [name]- we will recheck TSH off Synthroid Rx... ~  labs 5/10 off Synthroid since 1/10 showed TSH= 1.13 ~  labs 2/11 showed TSH= 1.82 ~  Labs 5/12 showed TSH= 1.81 ~  2013: She wanted to restart Thyroid hormone but TSH remains normal> referred to Endocrine for further eval but she was upset w/ DrEllison's eval; she will decide if she wants to pursue another opinion... ~  9/14: not currently on meds but she really wants thyroid Rx; prev TFTs all wnl; she prev had Endocrine in [place] prescribe Synthroid for her but she stopped going; clinically & biochemically euthyroid...   GERD (ICD-530.81) - prev on Nexium but off all meds since 1/10... IRRITABLE BOWEL SYNDROME (ICD-564.1) - colonoscopy 1982 by DrPatterson was WNL.Marland KitchenMarland Kitchen  ?KIDNEY STONE Hx >> pt said she passed a kidney stone 3/11 w/o any medical attention, w/o work up or proof of same; entry made here for future reference if needed.  UTERINE CANCER, HX OF (ICD-V10.42) - s/p laparoscopic hysterectomy & BSO 7/07 by DrSkinner at [place]...   DEGENERATIVE JOINT DISEASE (ICD-715.90) - on VICODIN tid prn,  ANAPROX 220mg  Bid prn, ULTRAM 50mg  Prn... ? 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 she was on Wellbutrin & Vicodin when last seen in 2006... VITAMIN D DEFICIENCY (ICD-268.9) - Vit D level 5/10 = 16... rec> start OTC Vit D 2000 u daily. ~  9/14: on Vicodin, Tramadol, Aleve, VitD,  etc; prev eval by DrDeveshwar for rheum; now c/o pain in left knee (she thinks it's "siatica"), can't exercise & wants to see [facility]- ok...  Hx of HEADACHE (ICD-784.0)  DYSTHYMIA (ICD-300.4) - off Celexa,  off Zoloft, and on ALPRAZOLAM 0.5mg Tid as needed... she has seen psychiatry in the past (DrBarnett in [place]) and was Dx w/ seasonal affective disorder... prev seeing psychologist [name] for eating disorder counselling... ~  4/13: she mentioned seeing [name] again for eating disorder & counseling for depression etc... 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   HEALTH MAINTENANCE:   ~  GI:  Per seen by DrPatterson but last colon was 1982 & she refuses follow up... ~  GYN:  She had Lap  hyst & BSO 2007 by DrSkinner at [facility] ~  Immuniz:  She had Pneumovax in the past; she declines Flu shots and Tetanus shots...   Past Surgical History  Procedure Laterality Date  . 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 Appendectomy    . Cholecystectomy  1982  . Laparoscopic hysterectomy  2007    [place]  . Bilateral salpingoophorectomy  2007    [place]  . Laparoscopic gastric banding  08/2007    Dr. Daphine Deutscher    Outpatient Encounter Prescriptions as of 11/04/2012  Medication Sig Dispense Refill  . albuterol (PROAIR HFA) 108 (90 BASE) MCG/ACT inhaler Inhale 2 puffs into the lungs every 6 (six) hours as needed.  1 Inhaler  5  . albuterol (PROVENTIL) (2.5 MG/3ML) 0.083% nebulizer solution TAKE 3 MLS (2.5 MG TOTAL) BY NEBULIZATION EVERY 6 (SIX) HOURS AS NEEDED FOR WHEEZING.  75 mL  5  . ALPRAZolam (XANAX) 0.5 MG tablet TAKE 1/2 TO 1 TABLET BY MOUTH 3 TIMES DAILY AS NEEDED FOR NERVES. NOT TO EXCEED 3 PER DAY  90 tablet  5  . B Complex-C-Folic Acid (B-COMPLEX BALANCED) TABS Take 1 tablet by mouth daily.      .  cetirizine (ZYRTEC) 10 MG tablet Take 10 mg by mouth daily.        . Cholecalciferol (VITAMIN D-3) 5000 UNITS TABS Take 1 tablet by mouth daily.      . Coenzyme Q10 (COQ10) 100 MG CAPS Take 1 tablet by mouth daily.      Marland Kitchen Dextromethorphan-Guaifenesin (MUCINEX DM MAXIMUM STRENGTH) 60-1200 MG per 12 hr tablet Take 1 tablet by mouth daily as needed.        . Fluticasone-Salmeterol (ADVAIR DISKUS) 500-50 MCG/DOSE AEPB Inhale 1 puff into the lungs 2 (two) times daily.  180 each  3  . furosemide (LASIX) 40 MG tablet       . HYDROcodone-acetaminophen (NORCO/VICODIN) 5-325 MG per tablet Take 1 tablet by mouth 3 (three) times daily as needed for pain.  90 tablet  4  . Krill Oil 500 MG CAPS Take 1 capsule by mouth daily.      . Magnesium 200 MG TABS Take 1 tablet by mouth daily.      . naproxen sodium (ANAPROX) 220 MG tablet Take 2 tabs by mouth once daily       . tiotropium (SPIRIVA) 18 MCG inhalation capsule Place 1 capsule (18 mcg total) into inhaler and inhale daily.  90 capsule  3  . traMADol (ULTRAM) 50 MG tablet TAKE 1 TABLET BY MOUTH EVERY 6 HOURS AS NEEDED  30 tablet  0  . vitamin C (ASCORBIC ACID) 250 MG tablet Take 250 mg by mouth daily.      . [DISCONTINUED] furosemide (LASIX) 40 MG tablet TAKE 1 TO 2 TABS BY MOUTH ONCE DAILY AS NEEDED FOR SWELLING  60 tablet  6  . clotrimazole-betamethasone (LOTRISONE) cream Apply topically 2 (two) times daily.  30 g  1  . [DISCONTINUED] Cholecalciferol (VITAMIN D) 400 UNITS capsule Take 400 Units by mouth daily.      . [DISCONTINUED] Multiple Vitamin (MULTIVITAMIN) tablet Take 1 tablet by mouth daily.      . [DISCONTINUED] oxyCODONE-acetaminophen (PERCOCET/ROXICET) 5-325 MG per tablet Take 1 tablet by mouth 3 (three) times daily as needed for pain.  90 tablet  0  . [DISCONTINUED] traMADol (ULTRAM) 50 MG tablet TAKE 1 TABLET BY MOUTH TID   AS NEEDED for pain  90 tablet  5   No facility-administered encounter medications on file as of 11/04/2012.    Allergies   Allergen Reactions  . Penicillins     REACTION: nausea and vomiting    Current Medications, Allergies, Past Medical History, Past Surgical History, Family History, and Social History were reviewed in [employer] record.   Review of Systems         See HPI - all other systems neg except as noted... The patient complains of dyspnea on exertion and difficulty walking; chest discomfort, & leg weakness.  The patient denies anorexia, fever, weight loss, weight gain, vision loss, decreased hearing, hoarseness, syncope, prolonged cough, headaches, hemoptysis, abdominal pain, melena, hematochezia, severe indigestion/heartburn, hematuria, incontinence, suspicious skin lesions, transient blindness, depression, unusual weight change, abnormal bleeding, enlarged lymph nodes, and angioedema.    Objective:   Physical Exam    WD, Morbidly Obese, 62 y/o WF in NAD... GENERAL:  Alert & oriented; pleasant & cooperative; she is in better spirits today. HEENT:  [place]/AT, EOM-wnl, PERRLA, EACs-clear, TMs-wnl, NOSE-clear, THROAT-clear & wnl. NECK:  Supple w/ fairROM; no JVD; normal carotid impulses w/o bruits; no thyromegaly or nodules palpated; no lymphadenopathy. CHEST:  decr BS bilat; w/ silent chest- no wheezing, rales, or rhonchi... HEART:  Regular Rhythm; without murmurs/ rubs/ or gallops heard... ABDOMEN:  Obese, soft & nontender; normal bowel sounds; no organomegaly or masses detected. EXT: without deformities, mod arthritic changes; no varicose veins/ +venous insuffic/trace edema. NEURO:  CNs intact; no focal neuro deficits... DERM: few ecchymoses, no rash etc...  RADIOLOGY DATA:  Reviewed in the EPIC EMR & discussed w/ the patient...  LABORATORY DATA:  Reviewed in the EPIC EMR & discussed w/ the patient...   Assessment & Plan:    DYSPNEA>  The concern is that she has developed secondary pulm hypertension from her COPD/ Emphysema, OSA (largely untreated), morbid obesity,  hypoxemia, etc;  We outlined the need for further eval w/ 2DEcho & repeat sleep study (split night study to expedite rx) but she has repeatedly declined these studies, refuses to consider them again today;  I reiterated the serious nature of her respiratory problem & need for these diagnostic tests but she refuses;  She tried PulmRehab 6-7/13 but found it worthless as it was difficult to get there, too expensive, & she received min use of the machines; states she got more exercise at home w/ cleaning, laundry, cooking, pet care, etc...  OSA>  As noted she needs new sleep study & appropriate new CPAP apparatus to facilitate compliance but she declines the split night study...  COPD/ Emphysema>  Encouraged to stay on her O2 regularly, continue NEBS, Advair/ Spiriva regularly, use the Mucinex as needed; needs to lose wt & incr exercise- she tried PulmRehab as above; she does not feel she is benefiting from the O2 due to nasal problems & she has seen [name]; she might do better w/ transtrach oxygen vs sm trach tube...  Hx CP w/ neg cardiac evals including several Myoviews; we wanted to check 2DEcho to screen for [facility] but she declined...  OBESITY>  She had remote lap band surg by DrMMartin; Met syndrome, must get wt down etc; she is a lap band failure & prev counseling by [name]...  ? Hx Hypothy>  TFTs remain normal off meds- no problem...  GI>  GERD> she denies symptoms & uses OTC meds prn...  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  Dysthymia>  Much stress  in her life, on Alpraz prn; has embraced alt lifestyle...   Patient's Medications  New Prescriptions   No medications on file  Previous Medications   ALBUTEROL (PROAIR HFA) 108 (90 BASE) MCG/ACT INHALER    Inhale 2 puffs into the lungs every 6 (six) hours as needed.   ALBUTEROL (PROVENTIL) (2.5 MG/3ML) 0.083% NEBULIZER SOLUTION    TAKE 3 MLS (2.5 MG TOTAL) BY NEBULIZATION EVERY 6 (SIX) HOURS AS  NEEDED FOR WHEEZING.   ALPRAZOLAM (XANAX) 0.5 MG TABLET    TAKE 1/2 TO 1 TABLET BY MOUTH 3 TIMES DAILY AS NEEDED FOR NERVES. NOT TO EXCEED 3 PER DAY   B COMPLEX-C-FOLIC ACID (B-COMPLEX BALANCED) TABS    Take 1 tablet by mouth daily.   CETIRIZINE (ZYRTEC) 10 MG TABLET    Take 10 mg by mouth daily.     CHOLECALCIFEROL (VITAMIN D-3) 5000 UNITS TABS    Take 1 tablet by mouth daily.   CLOTRIMAZOLE-BETAMETHASONE (LOTRISONE) CREAM    Apply topically 2 (two) times daily.   COENZYME Q10 (COQ10) 100 MG CAPS    Take 1 tablet by mouth daily.   DEXTROMETHORPHAN-GUAIFENESIN (MUCINEX DM MAXIMUM STRENGTH) 60-1200 MG PER 12 HR TABLET    Take 1 tablet by mouth daily as needed.     FLUTICASONE-SALMETEROL (ADVAIR DISKUS) 500-50 MCG/DOSE AEPB    Inhale 1 puff into the lungs 2 (two) times daily.   HYDROCODONE-ACETAMINOPHEN (NORCO/VICODIN) 5-325 MG PER TABLET    Take 1 tablet by mouth 3 (three) times daily as needed for pain.   KRILL OIL 500 MG CAPS    Take 1 capsule by mouth daily.   MAGNESIUM 200 MG TABS    Take 1 tablet by mouth daily.   NAPROXEN SODIUM (ANAPROX) 220 MG TABLET    Take 2 tabs by mouth once daily    TIOTROPIUM (SPIRIVA) 18 MCG INHALATION CAPSULE    Place 1 capsule (18 mcg total) into inhaler and inhale daily.   TRAMADOL (ULTRAM) 50 MG TABLET    TAKE 1 TABLET BY MOUTH EVERY 6 HOURS AS NEEDED   VITAMIN C (ASCORBIC ACID) 250 MG TABLET    Take 250 mg by mouth daily.  Modified Medications   Modified Medication Previous Medication   FUROSEMIDE (LASIX) 40 MG TABLET furosemide (LASIX) 40 MG tablet          TAKE 1 TO 2 TABS BY MOUTH ONCE DAILY AS NEEDED FOR SWELLING  Discontinued Medications   CHOLECALCIFEROL (VITAMIN D) 400 UNITS CAPSULE    Take 400 Units by mouth daily.   MULTIPLE VITAMIN (MULTIVITAMIN) TABLET    Take 1 tablet by mouth daily.   OXYCODONE-ACETAMINOPHEN (PERCOCET/ROXICET) 5-325 MG PER TABLET    Take 1 tablet by mouth 3 (three) times daily as needed for pain.   TRAMADOL (ULTRAM) 50 MG  TABLET    TAKE 1 TABLET BY MOUTH TID   AS NEEDED for pain

## 2012-12-05 ENCOUNTER — Telehealth: Payer: Self-pay | Admitting: Pulmonary Disease

## 2012-12-05 DIAGNOSIS — M1990 Unspecified osteoarthritis, unspecified site: Secondary | ICD-10-CM

## 2012-12-05 DIAGNOSIS — M25569 Pain in unspecified knee: Secondary | ICD-10-CM

## 2012-12-05 NOTE — Telephone Encounter (Signed)
lmomtcb x1 

## 2012-12-05 NOTE — Telephone Encounter (Signed)
ATC line busy x 3 wcb 

## 2012-12-05 NOTE — Telephone Encounter (Signed)
I have sent Rebecca [name] a staff message letting her know this is in [facility]. Pt is aware. Nothing further needed

## 2012-12-05 NOTE — Telephone Encounter (Signed)
Per SN---  Ok to send in the order for the knee brace to [facility].  thanks

## 2012-12-05 NOTE — Telephone Encounter (Signed)
Pt is calling back.  Pt asked me to remind you that we must dial the area code when calling her cell phone.   Pt states she needs this completed ASAP today.   Rebecca [name]

## 2012-12-05 NOTE — Telephone Encounter (Signed)
I spoke with pt. She is requesting we send an order to [facility] for a knee brace. She has tried getting this through [facility] but it does not fit her well at all. She stated she has increased activity lately and her knee has been hurting her. Please advise Dr. [name] thanks Last OV 11/04/12

## 2012-12-06 NOTE — Telephone Encounter (Signed)
Per Efraim Kaufmann it is a retail only item. Advised her she would have to get this from the store. According to [facility] they received the order we placed in EPIC. They just need to know what [facility] store she is going to go to so they can send her RX over

## 2012-12-06 NOTE — Telephone Encounter (Addendum)
LMTCB with melissa to ask about knee brace order that was sent yesterday to [facility]. See other phone note from 12-05-12. Carron Curie, CMA

## 2012-12-06 NOTE — Telephone Encounter (Signed)
lmomtcb x1 

## 2012-12-06 NOTE — Telephone Encounter (Signed)
[name] returning the call.

## 2012-12-07 NOTE — Telephone Encounter (Signed)
I spoke with the patient and she states she went to 2 different [facility] stores and was advised that they do not file medicare for knee brace so the pt went to [facility] and bought a knee brace out of pocket. Pt states nothing further needed. I apologized for any inconvenience. I have cancelled the order through [facility].  Carron Curie, CMA

## 2013-01-05 ENCOUNTER — Other Ambulatory Visit: Payer: Self-pay

## 2013-01-06 ENCOUNTER — Other Ambulatory Visit: Payer: Self-pay | Admitting: Adult Health

## 2013-01-30 ENCOUNTER — Telehealth: Payer: Self-pay | Admitting: Pulmonary Disease

## 2013-01-30 NOTE — Telephone Encounter (Signed)
lmomtcb x1 

## 2013-01-31 MED ORDER — TIOTROPIUM BROMIDE MONOHYDRATE 18 MCG IN CAPS: 18.0000 ug | ORAL_CAPSULE | Freq: Every day | RESPIRATORY_TRACT | Status: DC

## 2013-01-31 NOTE — Telephone Encounter (Signed)
Pt was getting spiriva through patient assistance program but she now has drug coverage through her insurance and is asking for a rx. Rx sent. Pt advised. Carron Curie, CMA

## 2013-02-27 ENCOUNTER — Telehealth: Payer: Self-pay | Admitting: Pulmonary Disease

## 2013-02-27 DIAGNOSIS — M109 Gout, unspecified: Secondary | ICD-10-CM

## 2013-02-27 MED ORDER — METHYLPREDNISOLONE (PAK) 4 MG PO TABS: ORAL_TABLET | ORAL | Status: DC

## 2013-02-27 NOTE — Telephone Encounter (Signed)
Spoke with pt. C/o gout flare in both feet. C/o swelling in both ankles/feet, painful, red, has heat x 2 weeks now. She has tried taking anti inflammatories, vit C, increased fluids and took herbal cherry. Still no relief. Please advise SN thanks  Allergies  Allergen Reactions  . Penicillins     REACTION: nausea and vomiting

## 2013-02-27 NOTE — Telephone Encounter (Signed)
Per SN---  Medrol dosepak  #1  Take as directed May need ortho eval---[facility]

## 2013-02-27 NOTE — Telephone Encounter (Signed)
Pt aware of recs. rx sent. Nothing further needed 

## 2013-03-10 DIAGNOSIS — M25579 Pain in unspecified ankle and joints of unspecified foot: Secondary | ICD-10-CM | POA: Diagnosis not present

## 2013-04-12 ENCOUNTER — Other Ambulatory Visit: Payer: Self-pay | Admitting: Pulmonary Disease

## 2013-05-05 ENCOUNTER — Other Ambulatory Visit (INDEPENDENT_AMBULATORY_CARE_PROVIDER_SITE_OTHER): Payer: Medicare Other

## 2013-05-05 ENCOUNTER — Ambulatory Visit (INDEPENDENT_AMBULATORY_CARE_PROVIDER_SITE_OTHER): Payer: Medicare Other | Admitting: Pulmonary Disease

## 2013-05-05 ENCOUNTER — Ambulatory Visit (INDEPENDENT_AMBULATORY_CARE_PROVIDER_SITE_OTHER)
Admission: RE | Admit: 2013-05-05 | Discharge: 2013-05-05 | Disposition: A | Discharge status: Home / Self Care | Payer: Medicare Other | Source: Ambulatory Visit | Attending: Pulmonary Disease | Admitting: Pulmonary Disease

## 2013-05-05 ENCOUNTER — Encounter: Payer: Self-pay | Admitting: Pulmonary Disease

## 2013-05-05 VITALS — BP 124/80 | HR 91 | Temp 98.2°F | Ht 66.0 in | Wt 270.2 lb

## 2013-05-05 DIAGNOSIS — J4489 Other specified chronic obstructive pulmonary disease: Secondary | ICD-10-CM

## 2013-05-05 DIAGNOSIS — F341 Dysthymic disorder: Secondary | ICD-10-CM

## 2013-05-05 DIAGNOSIS — E119 Type 2 diabetes mellitus without complications: Secondary | ICD-10-CM

## 2013-05-05 DIAGNOSIS — R0789 Other chest pain: Secondary | ICD-10-CM | POA: Diagnosis not present

## 2013-05-05 DIAGNOSIS — K219 Gastro-esophageal reflux disease without esophagitis: Secondary | ICD-10-CM

## 2013-05-05 DIAGNOSIS — K589 Irritable bowel syndrome without diarrhea: Secondary | ICD-10-CM

## 2013-05-05 DIAGNOSIS — G4733 Obstructive sleep apnea (adult) (pediatric): Secondary | ICD-10-CM

## 2013-05-05 DIAGNOSIS — M1990 Unspecified osteoarthritis, unspecified site: Secondary | ICD-10-CM

## 2013-05-05 DIAGNOSIS — J449 Chronic obstructive pulmonary disease, unspecified: Secondary | ICD-10-CM

## 2013-05-05 DIAGNOSIS — R7302 Impaired glucose tolerance (oral): Secondary | ICD-10-CM

## 2013-05-05 DIAGNOSIS — E039 Hypothyroidism, unspecified: Secondary | ICD-10-CM

## 2013-05-05 DIAGNOSIS — R072 Precordial pain: Secondary | ICD-10-CM | POA: Diagnosis not present

## 2013-05-05 DIAGNOSIS — R7309 Other abnormal glucose: Secondary | ICD-10-CM

## 2013-05-05 DIAGNOSIS — I872 Venous insufficiency (chronic) (peripheral): Secondary | ICD-10-CM

## 2013-05-05 LAB — BASIC METABOLIC PANEL
BUN: 25 mg/dL — AB (ref 6–23)
CHLORIDE: 102 meq/L (ref 96–112)
CO2: 29 mEq/L (ref 19–32)
Calcium: 9.9 mg/dL (ref 8.4–10.5)
Creatinine, Ser: 1.5 mg/dL — ABNORMAL HIGH (ref 0.4–1.2)
GFR: 35.98 mL/min — ABNORMAL LOW (ref >60.00)
Glucose, Bld: 95 mg/dL (ref 70–99)
Potassium: 4.4 mEq/L (ref 3.5–5.1)
Sodium: 141 mEq/L (ref 135–145)

## 2013-05-05 LAB — HEMOGLOBIN A1C: HEMOGLOBIN A1C: 6.8 % — AB (ref 4.6–6.5)

## 2013-05-05 MED ORDER — ALPRAZOLAM 0.5 MG PO TABS: ORAL_TABLET | ORAL | Status: DC

## 2013-05-05 MED ORDER — ALBUTEROL SULFATE HFA 108 (90 BASE) MCG/ACT IN AERS: 2.0000 | INHALATION_SPRAY | Freq: Four times a day (QID) | RESPIRATORY_TRACT | Status: DC | PRN

## 2013-05-05 NOTE — Progress Notes (Addendum)
Subjective:   HPI Review of Systems Physical Exam  Patient ID: Rebecca [name], female   DOB: 10-Feb-1951, 63 y.o.   MRN: [id number]  Subjective:    Patient ID: Rebecca [name], female    DOB: January 05, 1951, 63 y.o.   MRN: [id number]  HPI 63 y/o WF known to me w/ mult med problems as noted below...  ~  June 08, 2011:  50moROV & she did not pursue any of the diagnostic or therapeutic interventions we mentioned last OV (she refused 2DEcho, split night sleep study, formal pulm rehab program- "I can't afford the 20% copay"); her CC today is that she has a hard time breathing thru her nose (prob dev septum) & she wants ENT referral- OK;  She has Home O2, Advair500- Bid, Spiriva, Mucinex- 2Bid w/ Fluids, ProairHFA rescue inhaler;  She has the Helios liq O2 system but doesn't like this either- ?it "cakes" the phlegm in her nose?  She has Lasix410mtaking 1-2 daily but only using as needed for swelling she says;  She has Vicodin, Anaprox, Ultram, Flexeril for various pains and muscle spasm (she says she gets severe musc spasms every time she uses the Flutter valve to aide in mucocilliary clearance);  She notes that the Flexeril 20m77mid is "really great- it helps me breathe [name]");  She has Zoloft 100m43mfor depression and Alprazolam 0.20mg 62m prn anxiety...  I have reiterated the need for sleep study/ CPAP, 2DEcho to eval for PulmHTN, and formal PulmRehab program as one of the most important interventions that will help her breathing etc;  She notes that she has to stop when ambulating from the bathroom to the kitchen, I feel pulm rehab is critically important to her, she declines & will not consider this intervention...  Today she wants ENT referral for her nose & wants another Endocrine referral for her Thyroid (recall prev insistence that she had thyroid dysfunction despite all normal TFTs & finally got an Endocrine in [place] ([name]) to prescribe thyroid hormone for her at her request)... Review of EPIC> last  labs were 5/12 & these need updating... CXR 3/13 showed borderline heart size, sl incr interstitial markings & left basilar Atx, lungs otherw clear, NAD, degen changes in TSpine...  ~  December 08, 2011:  77mo R520mo [name] saArtis DelayrEllison for Endocrine 4/13 but she is quite upset as he never addressed her Thyroid, just told her she had DM (A1c was 7.1) & prescribed Metformin500mg/d47mch she refused to fill & will not follow up w/ him; I offered another Endocrine eval, but she is thinking she might just ret to [name] in [place], she will let me know... We discussed "abnormal glucose tolerance" as a pre-diabetic diagnosis & the need for low carb wt reducing diet ...    She has had a severe emotional upset over the last 77mo> he31moster [name] hasOpal Sidlesn trying to get her evicted from her home (deceased mother's house that she gets to stay in during her life per Trust arrangement); [name] has been helping her w/ eating disorder & anxiety/depression & she tells me that she doesn't need meds- her alternative lifestyle functions to release endorphins that in turn stimulate her endocrine system to prevent depression she says...    Recall that [name] wanted to operate on her nose/sinuses/upper airway but we encouraged caution due to her severe airway dis/ COPD; [facility]" as it was 40miles,42m0/wk &  she barely got to do any exercise on the machines; it was a greater effort to get to the rehab dept than to exercise when there, and she felt that she did more exercise at home doing her cleaning, cooking, laundry, bathing, caring for dogs, etc; she even painted a room at home (but has someone do her yard work); she feels the prob is that the nasal cannula doesn't help due to nasal obstruction & putting cannula in mouth is only a temp fix; we discussed the poss to transtracheal catheter & she is interested in pursuing this approach...     She further notes that she drinks a  liver detoxer and 1 gal of home carbonated water per day "I belch & it makes room for me to eat"; weight is up 9# to 289# and we reviewed low carb, low fat, wt reducing diet...    We reviewed prob list, meds, xrays and labs> see below for updates >> she declines flu vaccine...  ~  November 04, 2012:  17moROV & [name] has lost 8# down to 281# today but she continues w/ numerous complaints, nothing is [name] she says, see below; We reviewed the following medical problems during today's office visit >>     Hx mild OSA w/ mod desat, loud snoring, & leg jerks (on sleep study 2005)> she tried CPAP10 but stopped on her own & declined to repeat split night study & re-try CPAP rx...    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 1993; supposed to be on HomeO2- 2L rest & 4L exercise, NEBS w/ Albut, Advair500Bid, Spiriva daily, Mucinex1-2Bid but not taking anything regularly; states that Flutter & Mucinex cause "spasms" in her back; states O2 doesn't help since she can't breathe thru her nose (she has been eval by ENT, CErnesto Rutherford; [facility] "fruit[facility]"; states she "can't breathe at all, can't do anything" etc; SOB w/ ADLs=> O2 recert today (see below)... Note> [facility] and last PFT (2011) showed FEV1= 1.5 (55%) & %1sec=59...    Hx AtypCP> baseline EKG w/ NSSTTWA; prev 2DEcho w/ norm LVF, EF=50-55%, mild MR/TR, norm LA/RV; Myoview 2009 normal w/o ischemia & EF=55%...    Ven Insuffic> on Lasix40-2Qam; she knows not to eat sodium, elev legs, wear support hose...    Impaired Gluc Tolerance, Metabolic syndrome> she refused meds; prev eval by DrEllison w/ rec for Metformin Rx but she rejects the DM diagnosis & refused meds; states "I'm eating healthier" (advised low carb low fat), not checking sugars, prev labs w/ A1c=7.1    ?Hypothyroid> not currently on meds but she really wants thyroid Rx; prev TFTs all wnl; she prev had Endocrine in [place] prescribe Synthroid for her but  she stopped going; clinically & biochemically euthyroid...    Morbid Obesity> peak weight ~340# before lap band surg 2009 by DrMMartin; lost to ~250# but then back up to 280-90#; refused f/u w/ [facility] due to cost of visits & lap-band adjustments; we reviewed diet/ exercise...    GI- GERD, IBS> she uses OTC PPI as needed; last colonoscopy was 1982 by DrPatterson & wnl, she refuses GI referral & refuses f/u colon etc...    GYN- s/p uterine cancer> [facility]    DJD, ?FM, VitD defic> on Vicodin, Tramadol, Aleve, VitD, etc; prev eval by DrDeveshwar for rheum; now c/o pain in left knee (she thinks it's "siatica"), can't exercise & wants to [facility]- ok...    Anxiety/ Depression> on Alprazolam 0.49m tid prn; prev eval by Psychiatry w/ seasonal affective disorder; still sees Psychology [name] re: eating disorder; under considerable stress due to relationship w/ sister, financial pressure, etc... We reviewed prob list, meds, xrays and labs> see below for updates >> she take numerous herbs, supplements, etc- often rec to her by her alternative lifestyle acquaintances...      OXYGEN RE-CERTIFICATION:   96/4/33   1.  The pt has a continued need for home oxygen therapy due to her severe COPD/ emphysema...    2.  She is using the oxygen in the home w/ exercise (4L/min), and at night (2L/min)...    3.  O2 sats at rest on RA today=> 92% w/ pulse rate 82/min...     4.  Pt exercised in office on RA => after 2laps O2 sat nadir= 86% w/ pulse rate=105...    5.  Pt placed on O2 at 4L/min via [place] & exercise repeated => after 2laps O2 sat nadir= 92% w/ pulse rate=102  ~  May 05, 2013:  667moOV & when GiArtis Delayas seen last (9/14) she was placed on O2 but she was miserable- "couldn't do anything"; she tells me she started doing her own research & found out that Magnesium is very important (helps her leg cramps) and you can still be low w/ normal blood levels in fact everybody is  deficient so she started taking a special easily absorbed magnesium supplement and within 4H of the 1st dose her breathing was 100% [name] & she feels that her breathing is cured, hasn't needed any oxygen (but wants to keep it just in case), and now she's walking, exercising, & she's lost 10#...  She also wanted to let me know about the benefits to [name] (horse linament) that she rubs into her knees and it works great, it's a great anti-inflamm & analgesic, doesn't need any Hydrocodone etc... Finally she wanted to let me know about 2 supplements that have been very helpful- "Clear Lungs" and "Lung tonic"...  We reviewed the following medical problems during today's office visit >>     Hx mild OSA w/ mod desat, loud snoring, & leg jerks (on sleep study 2005)> she tried CPAP10 but stopped on her own & declined to repeat split night study & re-try CPAP rx; she says she's sleeping satis & wakes feeling refreshed...    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 1993; supposed to be on HomeO2- 2L rest & 4L exercise, NEBS w/ Albut, Advair500Bid, Spiriva daily, Mucinex1-2Bid but not taking anything regularly; states that Flutter & Mucinex cause "spasms" in her back; states O2 doesn't help since she can't breathe thru her nose (she has been eval by ENT, CrErnesto Rutherford [facility] after 12[facility]15mo "fruitless"; prev stated she "can't breathe at all, can't do anything" etc; SOB w/ ADLs etc; Note> [facility] and last PFT (2011) showed FEV1= 1.5 (55%) & %1sec=59 ==> 3/15 she announced that she's been cured by taking a Magnesium supplement, along w/ herbal formula "Clear lungs" and "Lung tonic"; she has stopped her Oxygen, still using Advair500 & Spiriva, using Mucinex 7 AlbutHFA just prn...     Hx AtypCP> baseline EKG w/ NSSTTWA; prev 2DEcho w/ norm LVF, EF=50-55%, mild MR/TR, norm LA/RV; Myoview 2009 normal w/o ischemia & EF=55%; she notes some atypCP & requests f/u cardiac eval (prev seen by  DrWall)...    Ven Insuffic> on Lasix40-2Qam;  she knows not to eat sodium, elev legs, wear support hose; Labs 3/15 showed Cr=1.5 & she is asked to decr the Lasix to 25mQam...    Impaired Gluc Tolerance, Metabolic syndrome> she refused meds; prev eval by DrEllison w/ rec for Metformin Rx but she rejects the DM diagnosis & refused meds; states "I'm eating healthier" (advised low carb low fat); Labs 3/15 showed BS=95, A1c=6.8..Marland KitchenMarland Kitchen   ?Hypothyroid> not currently on meds but she really wants thyroid Rx; prev TFTs all wnl; she prev had Endocrine in [place] prescribe Synthroid for her but she stopped going; clinically & biochemically euthyroid...    Morbid Obesity> peak weight ~340# before lap band surg 2009 by DrMMartin; lost to ~250# but then back up to 280-90#; refused f/u w/ [facility] due to cost of visits & lap-band adjustments; 3/15 weight down to 270# & we reviewed diet & exercise...    GI- GERD, IBS> she uses OTC PPI as needed; last colonoscopy was 1982 by DrPatterson & wnl, she refuses GI referral & refuses f/u colon etc...    GYN- s/p uterine cancer> s/[facility]    DJD, ?FM, VitD defic> prev on Vicodin, Tramadol, Aleve, VitD; prev eval by DrDeveshwar for rheum; now she states that [name] ("horse linament") applied topically has worked like a miracle & she has increased exercise etc...    Anxiety/ Depression> on Alprazolam 0.519mprn; prev eval by Psychiatry w/ seasonal affective disorder; still sees Psychology [name] re: eating disorder; under considerable stress due to relationship w/ sister, financial pressure, etc... We reviewed prob list, meds, xrays and labs> see below for updates >> she refused the 2014 Flu vaccine... She has declined TDAP & Pneumovax as well... CXR 3/15 showed underlying COPD, atelectasis in RML, DJD in TSpine, NAD; Rec to take OTC Mucinex600-2Bid w/ fluids... LABS 3/15:  Chems- BS=95 A1c=6.8 (needs low carb diet, exercise, wt loss) & Creat=1.5 (mild  renal insuffic due to Lasix rx; Rec to decrease Lasix40 to 1 tab Qam...           Problem List:    OBSTRUCTIVE SLEEP APNEA (ICD-327.23) - sleep study 2005 showed RDI=15 w/ desat to 78%, loud snoring & +leg jerks> on CPAP 10, prev used intermittently but now not at all... ~  11/12:  She has agreed to a new Sleep Study- split night protocol, to recheck her OSA & determine CPAP needs (check ABG if poss too)==> she never followed thru w/ the study. ~  Pt states that she is resting satis and wakes refreshed, energy [name]...  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 supposed to be on NEBULIZER Qid, ADVAIR 500Bid + SPIRIVA daily (she freq stops all meds) ==> [facility]... ~  A1AT level is normal 218 (83-200) 3/09... ~  baseline CXR w/ borderline Cor, COPD, interstitial scarring/ atelectasis/ obesity... ~  PFTs 3/07 showed FVC=2.74 (82%), FEV1=1.67 (62%), %1sec=61, mid-flows=27%pred... improved from 2005. ~  CTChest in 2007 & 4/09 showed severe centrilob emyphysema, + interstitial fibrosis, sm HH, left renal cyst... neg for PE... ~  2010:  breathing improved w/ weight reduction after Lap-band surg... ~  2/11: COPD exac w/ neg CXR (chr changes, NAD), Rx w/ Depo/ Pred/ Avelox/ Mucinex/ NEBS/ Advair/ Spiriva/ etc... ~  PFTs 3/11 showed FVC= 2.57 (73%), FEV1= 1.51 (55%), %1sec=59, mid-flows= 21%pred. ~  Noncompliant w/ Rx & O2 thru 2012... On disability since 2011 &  finally got Medicare coverage 9/12... ~  11/12: presents w/ worsening breathing & dyspnea w/ ADLs ==> we outlined further eval w/ 2DEcho & Sleep Study; plus Rx w/ NEBS QID, Advair500Bid, Spiriva daily, Mucinex, Oxygen, Lasix, Zoloft, Xanax, etc (she declined to proceed w/ the sleep study, 2DEcho, etc)... 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  ~  4/13: she is c/o the nasal O2 not  helping since it's hard to breathe thru nose & wants ENT referral==> saw [name] who wanted to do surg but she has severe COPD & hi risk; asked to start PulmRehab & she agrees... ~  10/13: she did PulmRehab for 29mo6-7/13 but quit since it was "fruitless"- barely got to do any exercise since it was so difficult getting to the facility,etc; still c/o nasal problems limiting her benefit from O2 & we discussed the poss of ?transtracheal oxygen? ~  9/14: exsmoker quit 1993; supposed to be on HomeO2- 2L rest & 4L exercise, NEBS w/ Albut, Advair500Bid, Spiriva daily, Mucinex1-2Bid but not taking anything regularly; states that Flutter & Mucinex cause "spasms" in her back; states O2 doesn't help since she can't breathe thru her nose (she has been eval by ENT, CErnesto Rutherford; [facility] "fruitless"; states she "can't breathe at all, can't do anything" etc; SOB w/ ADLs=> O2 recert today (see below)... Note> [facility] and last PFT (2011) showed FEV1= 1.5 (55%) & %1sec=59... OXYGEN RE-CERT done today... ~  3/15: see above- pt states that a magnesium supplement along w/ herbal supplements "clear lungs" and "lung tonic" have made all the difference & she no longer needs oxygen, exercising regularly & much improved; still taking Advair500, Spiriva, AlbutHFA prn...  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  ATYPICAL CHEST PAIN (ICD-786.50) - eval by DrWall in 2009. ~  baseline EKG w/ NSR, PVC's, NSSTTWA, NAD... ~  2DEcho 2/05 showed mild MR/ TR, norm LA/ RV, EF=50-55%... ~  NuclearStressTest 4/09 was norm- no ischemia, EF=55%... [facility]... ~  3/15: she notes some atypCP & requests a cardiac referral for eval- prev seen by DrWall, we will call for Cards consult per her request...  VENOUS INSUFFICIENCY, CHRONIC (ICD-459.81) - Hx chr ven insuffic  w/ edema... on LASIX 4052m1-2 daily (she self medicates)... ~  Labs 3/15 showed Cr=1.5 and she is asked to decr the Lasix to 74m39mm...  IMPAIRED GLUCOSE TOLERANCE >>  METABOLIC SYNDROME X (ICD-KTG-256.3she is on diet alone (refused Metformin therapy)... ~  labs 9/08 showed BS=120... FBS 5/08 was 93, HgA1c=7.1 ~  labs 5/10 showed BS= 182, A1c= 6.4 ~  labs 2/11 showed BS= 76, A1c= 6.5 ~  Labs 5/12 showed BS= 103, A1c= 6.5 ~  She saw DrEllison 4/13 w/ A1c=7.1 but she rejects the diagnosis of Diabetes & refused Metform500mg47m. ~  10/13:  We discussed IMPAIRED GLUCOSE TOLERANCE & need for low carb wt reducing diet and incr exercise... ~  9/14: he refused meds; prev eval by DrEllison w/ rec for Metformin Rx but she rejects the DM diagnosis & refused meds; states "I'm eating healthier" (advised low carb low fat), not checking sugars, prev labs w/ A1c=7.1  ~  3/15: on diet alone; Labs 3/15 showed BS= 95, A1c= 6.8  MORBID OBESITY (ICD-278.01) -  she prev saw [name] for counselling about her eating disorder... ~  max weight ~340# before lap band surg 7/09 by DrMartin. ~  weight 12/09 = 291# ~  weight 5/10 = 265# ~  weight 11/10- = 254# ~  weight 2/11 = 260# ~  Weight 5/12= 290# ~  Weight 11/12 = 286# ~  Weight 4/13 = 280# ==> BMI=45 ~  Weight 10/13 = 289# ~  Weight 9/14 = 281# ~  Weight 3/15 = 270#  ? Hx HYPOTHYROIDISM (ICD-244.9) - off thyroid meds since 1/10... she was started on Synthroid and followed by DrRSmith in [place] & last seen 7/09- note reviewed... she refuses f/u by [name]- we will recheck TSH off Synthroid Rx... ~  labs 5/10 off Synthroid since 1/10 showed TSH= 1.13 ~  labs 2/11 showed TSH= 1.82 ~  Labs 5/12 showed TSH= 1.81 ~  2013: She wanted to restart Thyroid hormone but TSH remains normal> referred to Endocrine for further eval but she was upset w/ DrEllison's eval; she will decide if she wants to pursue another opinion... ~  9/14: not currently on meds but she  really wants thyroid Rx; prev TFTs all wnl; she prev had Endocrine in [place] prescribe Synthroid for her but she stopped going; clinically & biochemically euthyroid...  ~  3/15: on Synthroid10-12 tab daily; Labs showed TSH= 3.00  GERD (ICD-530.81) - prev on Nexium but off all meds since 1/10... IRRITABLE BOWEL SYNDROME (ICD-564.1) - colonoscopy 1982 by DrPatterson was WNL... She has refused f/u GI eval & f/u colonoscopy...  ?KIDNEY STONE Hx >> pt said she passed a kidney stone 3/11 w/o any medical attention, w/o work up or proof of same; entry made here for future reference if needed.  UTERINE CANCER, HX OF (ICD-V10.42) - s/p laparoscopic hysterectomy & BSO 7/07 by DrSkinner at [place]...   DEGENERATIVE JOINT DISEASE (ICD-715.90) - on VICODIN tid prn,  ANAPROX 2639m Bid prn, ULTRAM 566mPrn... ? 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 she was on Wellbutrin & Vicodin when last seen in 2006... VITAMIN D DEFICIENCY (ICD-268.9) - Vit D level 5/10 = 16... rec> start OTC Vit D 2000 u daily. ~  9/14: on Vicodin, Tramadol, Aleve, VitD, etc; prev eval by DrDeveshwar for rheum; now c/o pain in left knee (she thinks it's "siatica"), can't exercise & wants [facility]- ok... ~  3/15: she states that topical [name] ("horse linament") rubbed into knees really helps but she wants to keep the Vicodinon hand just in case...  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 she has [place] psychiatry [place] the [place]/ [place]... prev seeing psychologist [name] for eating disorder counselling... ~  4/13: she mentioned seeing [name] again for eating disorder & counseling for depression etc... ~  9/14: on Alprazolam 0.39mg71md prn; prev eval by Psychiatry w/ seasonal affective disorder; still sees Psychology [name] re:  eating disorder; under considerable stress due to relationship w/ sister, financial pressure, etc...  ~  3/15: she has the Alpraz0.39mg 58m prn use but seldom uses it; still sees [name] on occas but doesn't think she needs to continue...  HEALTH MAINTENANCE:   ~  GI:  Per seen by DrPatterson but last colon was 1982 & she refuses follow up... ~  GYN:  She had Lap hyst & BSO 2007  [facility]inner at [facility] ~  Immuniz:  She had Pneumovax in the past; she declines Flu shots and Tetanus shots...   Past Surgical History  Procedure Laterality Date  . 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 Appendectomy    . Cholecystectomy  1982  . Laparoscopic [place]rectomy  [place]    [place]  . Bilateral sal[place]oophorectomy  2007    [place]  . Laparoscopic gastric banding  08/2007    Dr. Hassell Done    Outpatient Encounter Prescriptions as of 05/05/2013  Medication Sig  . albuterol (PROAIR HFA) 108 (90 BASE) MCG/ACT inhaler Inhale 2 puffs into the lungs every 6 (six) hours as needed.  Marland Kitchen albuterol (PROVENTIL) (2.5 MG/3ML) 0.083% nebulizer solution TAKE 3 MLS (2.5 MG TOTAL) BY NEBULIZATION EVERY 6 (SIX) HOURS AS NEEDED FOR WHEEZING.  . ALPRAZolam (XANAX) 0.5 MG tablet TAKE 1/2 TO 1 TABLET BY MOUTH 3 TIMES DAILY AS NEEDED FOR NERVES. NOT TO EXCEED 3 PER DAY  . B Complex-C-Folic Acid (B-COMPLEX BALANCED) TABS Take 1 tablet by mouth daily.  . cetirizine (ZYRTEC) 10 MG tablet Take 10 mg by mouth daily.    . Cholecalciferol (VITAMIN D-3) 5000 UNITS TABS Take 1 tablet by mouth daily.  . Coconut Oil 1000 MG CAPS Take 4 capsules by mouth daily.  . Coenzyme Q10 (COQ10) 100 MG CAPS Take 1 tablet by mouth daily.  . Fluticasone-Salmeterol (ADVAIR DISKUS) 500-50 MCG/DOSE AEPB Inhale 1 puff into the lungs 2 (two) times daily.  . furosemide (LASIX) 40 MG tablet TAKE 1 TO 2 TABS BY MOUTH ONCE DAILY AS NEEDED FOR SWELLING  . HYDROcodone-acetaminophen (NORCO/VICODIN) 5-325 MG per tablet Take 1 tablet by mouth 3 (three) times daily as  needed for pain.  Javier Docker Oil 500 MG CAPS Take 1 capsule by mouth daily.  . Magnesium 400 MG CAPS Take 2 capsules by mouth daily.  . naproxen sodium (ANAPROX) 220 MG tablet Take 2 tabs by mouth once daily   . tiotropium (SPIRIVA) 18 MCG inhalation capsule Place 1 capsule (18 mcg total) into inhaler and inhale daily.  . traMADol (ULTRAM) 50 MG tablet TAKE 1 TABLET BY MOUTH  3 TIMES DAILY   AS NEEDED FOR PAIN  . vitamin C (ASCORBIC ACID) 250 MG tablet Take 250 mg by mouth daily.  . [DISCONTINUED] clotrimazole-betamethasone (LOTRISONE) cream Apply topically 2 (two) times daily.  . [DISCONTINUED] Dextromethorphan-Guaifenesin (MUCINEX DM MAXIMUM STRENGTH) 60-1200 MG per 12 hr tablet Take 1 tablet by mouth daily as needed.    . [DISCONTINUED] Magnesium 200 MG TABS Take 1 tablet by mouth daily.  . [DISCONTINUED] methylPREDNIsolone (MEDROL DOSPACK) 4 MG tablet follow package directions  . [DISCONTINUED] traMADol (ULTRAM) 50 MG tablet TAKE 1 TABLET BY MOUTH EVERY 6 HOURS AS NEEDED    Allergies  Allergen Reactions  . Penicillins     REACTION: nausea and vomiting    Current Medications, Allergies, Past Medical History, Past Surgical History, Family History, and Social History were reviewed in [employer] record.   Review of Systems         See HPI - all other systems neg except as noted... The patient complains of dyspnea on exertion and difficulty walking; chest discomfort, & leg weakness.  The patient denies anorexia, fever, weight loss, weight gain, vision loss, decreased hearing, hoarseness, syncope, prolonged cough, headaches, hemoptysis, abdominal pain, melena, hematochezia, severe indigestion/heartburn, hematuria, incontinence, suspicious skin lesions, transient blindness, depression, unusual weight change, abnormal bleeding, enlarged lymph nodes, and  angioedema.    Objective:   Physical Exam    WD, Morbidly Obese, 63 y/o WF in NAD... GENERAL:  Alert & oriented;  pleasant & cooperative; she is in [name] spirits today. HEENT:  [place]/AT, EOM-wnl, PERRLA, EACs-clear, TMs-wnl, NOSE-clear, THROAT-clear & wnl. NECK:  Supple w/ fairROM; no JVD; normal carotid impulses w/o bruits; no thyromegaly or nodules palpated; no lymphadenopathy. CHEST:  decr BS bilat; w/ silent chest- no wheezing, rales, or rhonchi... HEART:  Regular Rhythm; without murmurs/ rubs/ or gallops heard... ABDOMEN:  Obese, soft & nontender; normal bowel sounds; no organomegaly or masses detected. EXT: without deformities, mod arthritic changes; no varicose veins/ +venous insuffic/trace edema. NEURO:  CNs intact; no focal neuro deficits... DERM: few ecchymoses, no rash etc...  RADIOLOGY DATA:  Reviewed in the EPIC EMR & discussed w/ the patient...  LABORATORY DATA:  Reviewed in the EPIC EMR & discussed w/ the patient...   Assessment & Plan:    DYSPNEA>  The concern is that she has developed secondary pulm hypertension from her COPD/ Emphysema, OSA (largely untreated), morbid obesity, hypoxemia, etc;  We outlined the need for further eval w/ 2DEcho & repeat sleep study (split night study to expedite rx) but she has repeatedly declined these studies, refuses to consider them again today;  I reiterated the serious nature of her respiratory problem & need for these diagnostic tests but she refuses;  She tried [facility] 6-7/13 but found it worthless as it was difficult to get there, too expensive, & she received min use of the machines; states she got more exercise at home w/ cleaning, laundry, cooking, pet care, etc... 3/15> she now feels she's been cured by a Magnesium supplement & herbal preps- "Clear lungs, and Lung tonic"... She declined to recheck O2 sats w/ ambulation in the office...   OSA>  As noted she needs new sleep study & appropriate new CPAP apparatus to facilitate compliance but she declines the split night study...  COPD/ Emphysema>  Encouraged to stay on her O2 regularly, continue  NEBS, Advair/ Spiriva regularly & use the Mucinex; needs to lose wt & incr exercise...  Hx CP w/ neg cardiac evals including several Myoviews; we wanted to check 2DEcho to screen for [facility] but she declined... 3/15> she requests Cardiology consult for eval of AtypCP...  OBESITY>  She had remote lap band surg by DrMMartin; Met syndrome, must get wt down etc; she is a lap band failure & prev counseling by [name]...  ? Hx Hypothy>  TFTs remain normal off meds- no problem...  GI>  GERD> she denies symptoms & uses OTC meds prn...  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  Dysthymia>  Much stress in her life, on Alpraz prn; has embraced alt lifestyle...   Patient's Medications  New Prescriptions   No medications on file  Previous Medications   B COMPLEX-C-FOLIC ACID (B-COMPLEX BALANCED) TABS    Take 1 tablet by mouth daily.   CETIRIZINE (ZYRTEC) 10 MG TABLET    Take 10 mg by mouth daily.     CHOLECALCIFEROL (VITAMIN D-3) 5000 UNITS TABS    Take 1 tablet by mouth daily.   COCONUT OIL 1000 MG CAPS    Take 4 capsules by mouth daily.   COENZYME Q10 (COQ10) 100 MG CAPS    Take 1 tablet by mouth daily.   FLUTICASONE-SALMETEROL (ADVAIR DISKUS) 500-50 MCG/DOSE AEPB    Inhale 1 puff into the lungs 2 (two) times daily.  FUROSEMIDE (LASIX) 40 MG TABLET    TAKE 1 TO 2 TABS BY MOUTH ONCE DAILY AS NEEDED FOR SWELLING   KRILL OIL 500 MG CAPS    Take 1 capsule by mouth daily.   MAGNESIUM 400 MG CAPS    Take 2 capsules by mouth daily.   NAPROXEN SODIUM (ANAPROX) 220 MG TABLET    Take 2 tabs by mouth once daily    TIOTROPIUM (SPIRIVA) 18 MCG INHALATION CAPSULE    Place 1 capsule (18 mcg total) into inhaler and inhale daily.   TRAMADOL (ULTRAM) 50 MG TABLET    TAKE 1 TABLET BY MOUTH  3 TIMES DAILY   AS NEEDED FOR PAIN   VITAMIN C (ASCORBIC ACID) 250 MG TABLET    Take 250 mg by mouth daily.  Modified Medications   Modified Medication Previous Medication   ALBUTEROL  (PROAIR HFA) 108 (90 BASE) MCG/ACT INHALER albuterol (PROAIR HFA) 108 (90 BASE) MCG/ACT inhaler      Inhale 2 puffs into the lungs every 6 (six) hours as needed.    Inhale 2 puffs into the lungs every 6 (six) hours as needed.   ALPRAZOLAM (XANAX) 0.5 MG TABLET ALPRAZolam (XANAX) 0.5 MG tablet      TAKE 1/2 TO 1 TABLET BY MOUTH 3 TIMES DAILY AS NEEDED FOR NERVES. NOT TO EXCEED 3 PER DAY    TAKE 1/2 TO 1 TABLET BY MOUTH 3 TIMES DAILY AS NEEDED FOR NERVES. NOT TO EXCEED 3 PER DAY  Discontinued Medications   ALBUTEROL (PROVENTIL) (2.5 MG/3ML) 0.083% NEBULIZER SOLUTION    TAKE 3 MLS (2.5 MG TOTAL) BY NEBULIZATION EVERY 6 (SIX) HOURS AS NEEDED FOR WHEEZING.   CLOTRIMAZOLE-BETAMETHASONE (LOTRISONE) CREAM    Apply topically 2 (two) times daily.   DEXTROMETHORPHAN-GUAIFENESIN (MUCINEX DM MAXIMUM STRENGTH) 60-1200 MG PER 12 HR TABLET    Take 1 tablet by mouth daily as needed.     HYDROCODONE-ACETAMINOPHEN (NORCO/VICODIN) 5-325 MG PER TABLET    Take 1 tablet by mouth 3 (three) times daily as needed for pain.   MAGNESIUM 200 MG TABS    Take 1 tablet by mouth daily.   METHYLPREDNISOLONE (MEDROL DOSPACK) 4 MG TABLET    follow package directions   TRAMADOL (ULTRAM) 50 MG TABLET    TAKE 1 TABLET BY MOUTH EVERY 6 HOURS AS NEEDED

## 2013-05-05 NOTE — Patient Instructions (Signed)
Today we updated your med list in our EPIC system...    Continue your current medications the same...     We refilled the meds you requested...  Congrats on your fantastic improvement...  We will set up a Cardiology appt at your request...  Keep up the good work w/ diet & exercise, work on weight reduction...  Let's plan a follow up visit in 67mo, sooner if needed for problems.Marland KitchenMarland Kitchen

## 2013-05-05 NOTE — Progress Notes (Deleted)
Subjective:     Patient ID: Rebecca [name], female   DOB: 12/08/1950, 63 y.o.   MRN: [id number]  HPI   Review of Systems     Objective:   Physical Exam     Assessment:     ***    Plan:     ***     Patient ID: Rebecca [name], female   DOB: 08-18-1950, 63 y.o.   MRN: [id number]  Subjective:    Patient ID: Rebecca [name], female    DOB: [birth year], 63 y.o.   MRN: [id number]  HPI 63 y/o WF known to me w/ mult med problems as noted below...  ~  Jul 15, 2010:  38moROV & she has seen TP over the past yr for acute exac- Rx Doxy/ Pred & improved;  She has received disability & will be elig for Medicare this fall she says;  Notes "breathing worse- my air goes in but comes right back out";  O2 sat 77% on arrival but not using oxygen because "it doesn't help" I explained why it helps but she isn't convinced; she has motorized wheelchair but can't transport it; she mentioned alternative lifestyle introduced by her daughter (child she gave up for adoption as a teen)...  CXR & labs are stable> encouraged to stay on meds regularly (-she hasn't), use O2 regularly (-she didn't), use CPAP nightly (-not using at all), get on diet, get wt down (-no change), increase exercise (-not able she says) & offered PulmRehab (-she declined)...  ~  January 14, 2011:  654moOV & she notes her breathing is much worse and "something has to be done", notes that she is on Medicare now;  She is tearful- "I can't do anything, can't go anywhere, no friends, I sleep 15H a day" but she still has her pets & new doberman "[name]" rescue dog; she notes SOB w/ ADLs- worse over the last yr, very poorly conditioned & hasn't tried exercise program & hasn't lost weight (prev declined pulm rehab on mult occas); she has a motorized wheelchair but it is too big & won't fit in her house (uses it when she worked at [employer] wants a rolling walker w/ seat so she can use it in the house & get from room to room; she saw TP 9/12 & [facility]  [facility] but she says it's way too heavy & not really portable therefore doesn't use it much- needs [name] port system like she has seen on the internet; also [facility] didn't come to check/ service/ replace old CPAP unit but she really needs new sleep study first;  Also c/o CP- her same atypCP that she has had, sharp/ sore/ hurts to breathe or cough, & Aleve helps;  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 which would be multifactorial from her COPD/Emphysema, OSA, Obesity;  SEE PREV STUDIES BELOW> we discussed:  2DEcho,  split night sleep study,  Cards consult w/ DrBensimhon,  PulmRehab program somewhere (she prefers [place] or [place])  ~  June 08, 2011:  55m47moV & she did not pursue any of the diagnostic or therapeutic interventions we mentioned last OV (she refused 2DEcho, split night sleep study, formal pulm rehab program- "I can't afford the 20% copay"); her CC today is that she has a hard time breathing thru her nose (prob dev septum) & she wants ENT referral- OK;  She has Home O2, Advair500- Bid, Spiriva, Mucinex- 2Bid w/ Fluids, ProairHFA rescue  inhaler;  She has the Helios liq O2 system but doesn't like this either- ?it "cakes" the phlegm in her nose?  She has Lasix8m taking 1-2 daily but only using as needed for swelling she says;  She has Vicodin, Anaprox, Ultram, Flexeril for various pains and muscle spasm (she says she gets severe musc spasms every time she uses the Flutter valve to aide in mucocilliary clearance);  She notes that the Flexeril 534mTid is "really great- it helps me breathe [name]");  She has Zoloft 10062m for depression and Alprazolam 0.5mg42md prn anxiety...  I have reiterated the need for sleep study/ CPAP, 2DEcho to eval for PulmHTN, and formal PulmRehab program as one of the most important interventions that will help her breathing etc;  She notes that she has to stop when ambulating from the bathroom to the kitchen, I feel pulm rehab is critically  important to her, she declines & will not consider this intervention...  Today she wants ENT referral for her nose & wants another Endocrine referral for her Thyroid (recall prev insistence that she had thyroid dysfunction despite all normal TFTs & finally got an Endocrine in [place] ([name]) to prescribe thyroid hormone for her at her request)... Review of EPIC> last labs were 5/12 & these need updating... CXR 3/13 showed borderline heart size, sl incr interstitial markings & left basilar Atx, lungs otherw clear, NAD, degen changes in TSpine...  ~  December 08, 2011:  59mo 90mo& [name] sArtis DelayDrEllison for Endocrine 4/13 but she is quite upset as he never addressed her Thyroid, just told her she had DM (A1c was 7.1) & prescribed Metformin500mg/3mich she refused to fill & will not follow up w/ him; I offered another Endocrine eval, but she is thinking she might just ret to [name] in [place], she will let me know... We discussed "abnormal glucose tolerance" as a pre-diabetic diagnosis & the need for low carb wt reducing diet ...    She has had a severe emotional upset over the last 59mo> h84moister [name] haOpal Sidlesen trying to get her evicted from her home (deceased mother's house that she gets to stay in during her life per Trust arrangement); [name] has been helping her w/ eating disorder & anxiety/depression & she tells me that she doesn't need meds- her alternative lifestyle functions to release endorphins that in turn stimulate her endocrine system to prevent depression she says...    Recall that [name] wanted to operate on her nose/sinuses/upper airway but we encouraged caution due to her severe airway dis/ COPD; [facility] as it was 40miles66m60/wk & she barely got to do any exercise on the machines; it was a greater effort to get to the rehab dept than to exercise when there, and she felt that she did more exercise at home doing her cleaning, cooking, laundry,  bathing, caring for dogs, etc; she even painted a room at home (but has someone do her yard work); she feels the prob is that the nasal cannula doesn't help due to nasal obstruction & putting cannula in mouth is only a temp fix; we discussed the poss to transtracheal catheter & she is interested in pursuing this approach...     She further notes that she drinks a liver detoxer and 1 gal of home carbonated water per day "I belch & it makes room for me to eat"; weight is up 9# to 289# and we  reviewed low carb, low fat, wt reducing diet...    We reviewed prob list, meds, xrays and labs> see below for updates >> she declines flu vaccine...  ~  November 04, 2012:  2moROV & [name] has lost 8# down to 281# today but she continues w/ numerous complaints, nothing is [name] she says, see below; We reviewed the following medical problems during today's office visit >>     Hx mild OSA w/ mod desat, loud snoring, & leg jerks (on sleep study 2005)> she tried CPAP10 but stopped on her own & declined to repeat split night study & re-try CPAP rx...    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 1993; supposed to be on HomeO2- 2L rest & 4L exercise, NEBS w/ Albut, Advair500Bid, Spiriva daily, Mucinex1-2Bid but not taking anything regularly; states that Flutter & Mucinex cause "spasms" in her back; states O2 doesn't help since she can't breathe thru her nose (she has been eval by ENT, CErnesto Rutherford; [facility] "[facility]"; states she "can't breathe at all, can't do anything" etc; SOB w/ ADLs=> O2 recert today (see below)... Note> [facility] and last PFT (2011) showed FEV1= 1.5 (55%) & %1sec=59...    Hx AtypCP> baseline EKG w/ NSSTTWA; prev 2DEcho w/ norm LVF, EF=50-55%, mild MR/TR, norm LA/RV; Myoview 2009 normal w/o ischemia & EF=55%...    Ven Insuffic> on Lasix40-2Qam; she knows not to eat sodium, elev legs, wear support hose...    Impaired Gluc Tolerance, Metabolic syndrome>  she refused meds; prev eval by DrEllison w/ rec for Metformin Rx but she rejects the DM diagnosis & refused meds; states "I'm eating healthier" (advised low carb low fat), not checking sugars, prev labs w/ A1c=7.1    ?Hypothyroid> not currently on meds but she really wants thyroid Rx; prev TFTs all wnl; she prev had Endocrine in [place] prescribe Synthroid for her but she stopped going; clinically & biochemically euthyroid...    Morbid Obesity> peak weight ~340# before lap band surg 2009 by DrMMartin; lost to ~250# but then back up to 280-90#; refused f/u w/ [facility] due to cost of visits & lap-band adjustments; we reviewed diet/ exercise...    GI- GERD, IBS> she uses OTC PPI as needed; last colonoscopy was 1982 by DrPatterson & wnl, she refuses GI referral & refuses f/u colon etc...    GYN- s/p uterine cancer> [facility].    DJD, ?FM, VitD defic> on Vicodin, Tramadol, Aleve, VitD, etc; prev eval by DrDeveshwar for rheum; now c/o pain in left knee (she thinks it's "siatica"), can't exercise & wants to [facility]- ok...    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 We reviewed prob list, meds, xrays and labs> see below for updates >> she take numerous herbs, supplements, etc- often rec to her by her alternative lifestyle acquaintances...    OXYGEN RE-CERTIFICATION:   9/59/2/42  The pt has a continued need for home oxygen therapy due to her severe COPD/ emphysema... 2.  She is using the oxygen in the home w/ exercise (4L/min), and at night (2L/min)... 3.  O2 sats at rest on RA today=> 92% w/ pulse rate 82/min...  4.  Pt exercised in office on RA => after 2laps O2 sat nadir= 86% w/ pulse  rate=105... 5.  Pt placed on O2 at 4L/min via [place] & exercise repeated => after 2laps O2 sat  nadir= 92% w/ pulse rate=102           Problem List:    OBSTRUCTIVE SLEEP APNEA (ICD-327.23) - sleep study 2005 showed RDI=15 w/ desat to 78%, loud snoring & +leg jerks> on CPAP 10, prev used intermittently but now not at all... ~  11/12:  She has agreed to a new Sleep Study- split night protocol, to recheck her OSA & determine CPAP needs (check ABG if poss too)==> she never followed thru w/ the study.  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 supposed to be on NEBULIZER Qid, ADVAIR 500Bid + SPIRIVA daily (she freq stops all meds) ==> she had a second opinion consult [facility] by DrAdair in 2006... ~  A1AT level is normal 218 (83-200) 3/09... ~  baseline CXR w/ borderline Cor, COPD, interstitial scarring/ atelectasis/ obesity... ~  PFTs 3/07 showed FVC=2.74 (82%), FEV1=1.67 (62%), %1sec=61, mid-flows=27%pred... improved from 2005. ~  CTChest in 2007 & 4/09 showed severe centrilob emyphysema, + interstitial fibrosis, sm HH, left renal cyst... neg for PE... ~  2010:  breathing improved w/ weight reduction after Lap-band surg... ~  2/11: COPD exac w/ neg CXR (chr changes, NAD), Rx w/ Depo/ Pred/ Avelox/ Mucinex/ NEBS/ Advair/ Spiriva/ etc... ~  PFTs 3/11 showed FVC= 2.57 (73%), FEV1= 1.51 (55%), %1sec=59, mid-flows= 21%pred. ~  Noncompliant w/ Rx & O2 thru 2012... On disability since 2011 & finally got Medicare coverage 9/12... ~  11/12: presents w/ worsening breathing & dyspnea w/ ADLs ==> we outlined further eval w/ 2DEcho & Sleep Study; plus Rx w/ NEBS QID, Advair500Bid, Spiriva daily, Mucinex, Oxygen, Lasix, Zoloft, Xanax, etc (she declined to proceed w/ the sleep study, 2DEcho, etc)... 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  ~  4/13: she is c/o the nasal O2 not helping since it's hard to breathe thru nose & wants ENT referral==> saw [name] who wanted to do surg but she has  severe COPD & hi risk; asked to start PulmRehab & she agrees... ~  10/13: she did PulmRehab for 29mo6-7/13 but quit since it was "fruitless"- barely got to do any exercise since it was so difficult getting to the facility,etc; still c/o nasal problems limiting her benefit from O2 & we discussed the poss of ?transtracheal oxygen? ~  9/14: exsmoker quit 1993; supposed to be on HomeO2- 2L rest & 4L exercise, NEBS w/ Albut, Advair500Bid, Spiriva daily, Mucinex1-2Bid but not taking anything regularly; states that Flutter & Mucinex cause "spasms" in her back; states O2 doesn't help since she can't breathe thru her nose (she has been eval by ENT, CErnesto Rutherford; [facility] "[facility]"; states she "can't breathe at all, can't do anything" etc; SOB w/ ADLs=> O2 recert today (see below)... Note> [facility] and last PFT (2011) showed FEV1= 1.5 (55%) & %1sec=59... OXYGEN RE-CERT done today...  ATYPICAL CHEST PAIN (ICD-786.50) - eval by DrWall in 2009. ~  baseline EKG w/ NSR, PVC's, NSSTTWA, NAD... ~  2DEcho 2/05 showed mild MR/ TR, norm LA/ RV, EF=50-55%... ~  NuclearStressTest 4/09 was norm- no ischemia, EF=55%... [facility]...  VENOUS INSUFFICIENCY, CHRONIC (ICD-459.81) - Hx chr ven insuffic w/ edema... on LASIX 4061m1-2 daily (she self  medicates)...  IMPAIRED GLUCOSE TOLERANCE >>  METABOLIC SYNDROME X (EXN-170.0) - she is on diet alone (refused Metformin therapy)... ~  labs 9/08 showed BS=120... FBS 5/08 was 93, HgA1c=7.1 ~  labs 5/10 showed BS= 182, A1c= 6.4 ~  labs 2/11 showed BS= 76, A1c= 6.5 ~  Labs 5/12 showed BS= 103, A1c= 6.5 ~  She saw DrEllison 4/13 w/ A1c=7.1 but she rejects the diagnosis of Diabetes & refused Metform568m/d... ~  10/13:  We discussed IMPAIRED GLUCOSE TOLERANCE & need for low carb wt reducing diet and incr exercise... ~  9/14: he refused meds; prev eval by DrEllison w/ rec for Metformin  Rx but she rejects the DM diagnosis & refused meds; states "I'm eating healthier" (advised low carb low fat), not checking sugars, prev labs w/ A1c=7.1   MORBID OBESITY (ICD-278.01) - she prev saw [name] for counselling about her eating disorder... ~  max weight ~340# before lap band surg 7/09 by DrMartin. ~  weight 12/09 = 291# ~  weight 5/10 = 265# ~  weight 11/10- = 254# ~  weight 2/11 = 260# ~  Weight 5/12= 290# ~  Weight 11/12 = 286# ~  Weight 4/13 = 280# ==> BMI=45 ~  Weight 10/13 = 289# ~  Weight 9/14 = 281#  ? Hx HYPOTHYROIDISM (ICD-244.9) - off thyroid meds since 1/10... she was started on Synthroid and followed by DrRSmith in [place] & last seen 7/09- note reviewed... she refuses f/u by [name]- we will recheck TSH off Synthroid Rx... ~  labs 5/10 off Synthroid since 1/10 showed TSH= 1.13 ~  labs 2/11 showed TSH= 1.82 ~  Labs 5/12 showed TSH= 1.81 ~  2013: She wanted to restart Thyroid hormone but TSH remains normal> referred to Endocrine for further eval but she was upset w/ DrEllison's eval; she will decide if she wants to pursue another opinion... ~  9/14: not currently on meds but she really wants thyroid Rx; prev TFTs all wnl; she prev had Endocrine in [place] prescribe Synthroid for her but she stopped going; clinically & biochemically euthyroid...   GERD (ICD-530.81) - prev on Nexium but off all meds since 1/10... IRRITABLE BOWEL SYNDROME (ICD-564.1) - colonoscopy 1982 by DrPatterson was WNL..Marland KitchenMarland Kitchen ?KIDNEY STONE Hx >> pt said she passed a kidney stone 3/11 w/o any medical attention, w/o work up or proof of same; entry made here for future reference if needed.  UTERINE CANCER, HX OF (ICD-V10.42) - s[facility]   DEGENERATIVE JOINT DISEASE (ICD-715.90) - on VICODIN tid prn,  ANAPROX 2289mBid prn, ULTRAM 5056mrn... ? of FIBROMYALGIA (ICD-729.1) - she has been eval by DrDeveshwar for Rheum who felt that she has  fibromyalgia and DJD... she was on Wellbutrin & Vicodin when last seen in 2006... VITAMIN D DEFICIENCY (ICD-268.9) - Vit D level 5/10 = 16... rec> start OTC Vit D 2000 u daily. ~  9/14: on Vicodin, Tramadol, Aleve, VitD, etc; prev eval by DrDeveshwar for rheum; now c/o pain in left knee (she thinks it's "siatica"), can't exercise & wants to [facility]- ok...  Hx of HEADACHE (ICD-784.0)  DYSTHYMIA (ICD-300.4) - off Celexa,  off Zoloft, and on ALPRAZOLAM 0.5mg40m as needed... she has [place] psychiatry [place] the [place]/ [place]... prev seeing psychologist [name] for eating disorder counselling... ~  4/13: she mentioned seeing [name] again for eating disorder & counseling for depression etc... 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   HEALTH MAINTENANCE:   ~  GI:  Per seen by DrPatterson but last colon was 1982 & she refuses follow up... ~  GYN:  [facility]  Immuniz:  She had Pneumovax in the past; she declines Flu shots and Tetanus shots...   Past Surgical History  Procedure Laterality Date  . 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 Appendectomy    . Cholecystectomy  1982  . Laparoscopic hysterectomy  2007    [place]  . Bilateral sal[place]oophorectomy  2007    [place]  . Laparoscopic gastric banding  08/2007    Dr. MHassell Done   Outpatient Encounter Prescriptions as of 05/05/2013  Medication Sig  . albuterol (PROAIR HFA) 108 (90 BASE) MCG/ACT inhaler Inhale 2 puffs into the lungs every 6 (six) hours as needed.  .Marland Kitchenalbuterol (PROVENTIL) (2.5 MG/3ML) 0.083% nebulizer solution TAKE 3 MLS (2.5 MG TOTAL) BY NEBULIZATION EVERY 6 (SIX) HOURS AS NEEDED FOR WHEEZING.  . ALPRAZolam (XANAX) 0.5 MG tablet TAKE 1/2 TO 1  TABLET BY MOUTH 3 TIMES DAILY AS NEEDED FOR NERVES. NOT TO EXCEED 3 PER DAY  . B Complex-C-Folic Acid (B-COMPLEX BALANCED) TABS Take 1 tablet by mouth daily.  . cetirizine (ZYRTEC) 10 MG tablet Take 10 mg by mouth daily.    . Cholecalciferol (VITAMIN D-3) 5000 UNITS TABS Take 1 tablet by mouth daily.  . Coconut Oil 1000 MG CAPS Take 4 capsules by mouth daily.  . Coenzyme Q10 (COQ10) 100 MG CAPS Take 1 tablet by mouth daily.  . Fluticasone-Salmeterol (ADVAIR DISKUS) 500-50 MCG/DOSE AEPB Inhale 1 puff into the lungs 2 (two) times daily.  . furosemide (LASIX) 40 MG tablet TAKE 1 TO 2 TABS BY MOUTH ONCE DAILY AS NEEDED FOR SWELLING  . HYDROcodone-acetaminophen (NORCO/VICODIN) 5-325 MG per tablet Take 1 tablet by mouth 3 (three) times daily as needed for pain.  .Javier DockerOil 500 MG CAPS Take 1 capsule by mouth daily.  . Magnesium 400 MG CAPS Take 2 capsules by mouth daily.  . naproxen sodium (ANAPROX) 220 MG tablet Take 2 tabs by mouth once daily   . tiotropium (SPIRIVA) 18 MCG inhalation capsule Place 1 capsule (18 mcg total) into inhaler and inhale daily.  . traMADol (ULTRAM) 50 MG tablet TAKE 1 TABLET BY MOUTH  3 TIMES DAILY   AS NEEDED FOR PAIN  . vitamin C (ASCORBIC ACID) 250 MG tablet Take 250 mg by mouth daily.  . [DISCONTINUED] clotrimazole-betamethasone (LOTRISONE) cream Apply topically 2 (two) times daily.  . [DISCONTINUED] Dextromethorphan-Guaifenesin (MUCINEX DM MAXIMUM STRENGTH) 60-1200 MG per 12 hr tablet Take 1 tablet by mouth daily as needed.    . [DISCONTINUED] Magnesium 200 MG TABS Take 1 tablet by mouth daily.  . [DISCONTINUED] methylPREDNIsolone (MEDROL DOSPACK) 4 MG tablet follow package directions  . [DISCONTINUED] traMADol (ULTRAM) 50 MG tablet TAKE 1 TABLET BY MOUTH EVERY 6 HOURS AS NEEDED    Allergies  Allergen Reactions  . Penicillins     REACTION: nausea and vomiting    Current Medications, Allergies, Past Medical History, Past Surgical History, Family History, and  Social History were reviewed in [employer]record.   Review of Systems         See HPI -  all other systems neg except as noted... The patient complains of dyspnea on exertion and difficulty walking; chest discomfort, & leg weakness.  The patient denies anorexia, fever, weight loss, weight gain, vision loss, decreased hearing, hoarseness, syncope, prolonged cough, headaches, hemoptysis, abdominal pain, melena, hematochezia, severe indigestion/heartburn, hematuria, incontinence, suspicious skin lesions, transient blindness, depression, unusual weight change, abnormal bleeding, enlarged lymph nodes, and angioedema.    Objective:   Physical Exam    WD, Morbidly Obese, 63 y/o WF in NAD... GENERAL:  Alert & oriented; pleasant & cooperative; she is in [name] spirits today. HEENT:  [place]/AT, EOM-wnl, PERRLA, EACs-clear, TMs-wnl, NOSE-clear, THROAT-clear & wnl. NECK:  Supple w/ fairROM; no JVD; normal carotid impulses w/o bruits; no thyromegaly or nodules palpated; no lymphadenopathy. CHEST:  decr BS bilat; w/ silent chest- no wheezing, rales, or rhonchi... HEART:  Regular Rhythm; without murmurs/ rubs/ or gallops heard... ABDOMEN:  Obese, soft & nontender; normal bowel sounds; no organomegaly or masses detected. EXT: without deformities, mod arthritic changes; no varicose veins/ +venous insuffic/trace edema. NEURO:  CNs intact; no focal neuro deficits... DERM: few ecchymoses, no rash etc...  RADIOLOGY DATA:  Reviewed in the EPIC EMR & discussed w/ the patient...  LABORATORY DATA:  Reviewed in the EPIC EMR & discussed w/ the patient...   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  OSA>  As noted she needs new sleep study & appropriate new CPAP apparatus to facilitate compliance but she declines the split night study...  COPD/ Emphysema>  Encouraged to stay on her O2 regularly, continue NEBS, Advair/ Spiriva regularly, use the Mucinex as needed; needs to lose wt & incr exercise- she tried PulmRehab as above; she does not feel she is benefiting from the O2 due to nasal problems & she has seen [name]; she might do [name] w/ transtrach oxygen vs sm trach tube...  Hx CP w/ neg cardiac evals including several Myoviews; we wanted to check 2DEcho to screen for [facility] but she declined...  OBESITY>  She had remote lap band surg by DrMMartin; Met syndrome, must get wt down etc; she is a lap band failure & prev counseling by [name]...  ? Hx Hypothy>  TFTs remain normal off meds- no problem...  GI>  GERD> she denies symptoms & uses OTC meds prn...  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  Dysthymia>  Much stress in her life, on Alpraz prn; has embraced alt lifestyle.Marland KitchenMarland Kitchen

## 2013-05-06 ENCOUNTER — Encounter: Payer: Self-pay | Admitting: Pulmonary Disease

## 2013-05-25 ENCOUNTER — Encounter: Payer: Self-pay | Admitting: Cardiovascular Disease

## 2013-05-25 ENCOUNTER — Ambulatory Visit (INDEPENDENT_AMBULATORY_CARE_PROVIDER_SITE_OTHER): Payer: Medicare Other | Admitting: Cardiovascular Disease

## 2013-05-25 VITALS — BP 140/68 | Ht 66.0 in | Wt 268.0 lb

## 2013-05-25 DIAGNOSIS — R0609 Other forms of dyspnea: Secondary | ICD-10-CM

## 2013-05-25 DIAGNOSIS — R0989 Other specified symptoms and signs involving the circulatory and respiratory systems: Secondary | ICD-10-CM | POA: Diagnosis not present

## 2013-05-25 DIAGNOSIS — R079 Chest pain, unspecified: Secondary | ICD-10-CM

## 2013-05-25 DIAGNOSIS — R0789 Other chest pain: Secondary | ICD-10-CM | POA: Diagnosis not present

## 2013-05-25 DIAGNOSIS — J449 Chronic obstructive pulmonary disease, unspecified: Secondary | ICD-10-CM

## 2013-05-25 DIAGNOSIS — R0600 Dyspnea, unspecified: Secondary | ICD-10-CM

## 2013-05-25 NOTE — Assessment & Plan Note (Signed)
Smoker with family history  Non invasive testing will be difficult due to lung disease and obesity but invasive cath not warranted Will try lexiscan myovue

## 2013-05-25 NOTE — Assessment & Plan Note (Addendum)
She seems to rationalize her non compliance and feels magnesium can replace oxygen.  F/U Dr Lenna Gilford Will attempt echo to r/o pulmonary hypertension and document normal LV EF

## 2013-05-25 NOTE — Progress Notes (Signed)
Patient ID: Rebecca [name], female   DOB: January 28, 1951, 63 y.o.   MRN: [id number]  63 yo overweight female referred for dyspnea.  She has sleep apnea and severe COPD  Quit smoking in 90's  Takes magnesium instead of wearing oxygen.  Indicates she cant breath through her nose and Dr Lenna Gilford would not let Dr Pixie Casino fix things.  She has seen Dr wall more than 6 years ago.  Normal echo with no pulmonary hypertension  S/P lab band by Dr Hassell Done with subsequent poor results and weight gain.  Chest gets tight with dyspnea on minimal exertion.  She indicates sats still drop below 90%  She has history of chronic PVCls and says she was denied a pulmonary trila due to abnormal holter a few years ago.  Occasional palpitations.  No syncope.  Mild chronic LE edema.  Pressure in chest usually associated with dyspnea No cough sputum  Occasional wheezing  No history of DVT or PE    ROS: Denies fever, malais, weight loss, blurry vision, decreased visual acuity, cough, sputum, SOB, hemoptysis, pleuritic pain, palpitaitons, heartburn, abdominal pain, melena, lower extremity edema, claudication, or rash.  All other systems reviewed and negative   General: Affect appropriate Obese white female  HEENT: normal Neck supple with no adenopathy JVP normal no bruits no thyromegaly Lungs clear with no wheezing and good diaphragmatic motion Heart:  S1/S2 no murmur,rub, gallop or click PMI normal Abdomen: benighn, BS positve, no tenderness, no AAA no bruit.  No HSM or HJR Distal pulses intact with no bruits No edema Neuro non-focal Skin warm and dry No muscular weakness  Medications Current Outpatient Prescriptions  Medication Sig Dispense Refill  . albuterol (PROAIR HFA) 108 (90 BASE) MCG/ACT inhaler Inhale 2 puffs into the lungs every 6 (six) hours as needed.  1 Inhaler  6  . ALPRAZolam (XANAX) 0.5 MG tablet TAKE 1/2 TO 1 TABLET BY MOUTH 3 TIMES DAILY AS NEEDED FOR NERVES. NOT TO EXCEED 3 PER DAY  90 tablet  5  . B  Complex-C-Folic Acid (B-COMPLEX BALANCED) TABS Take 1 tablet by mouth daily.      . cetirizine (ZYRTEC) 10 MG tablet Take 10 mg by mouth daily.        . Cholecalciferol (VITAMIN D-3) 5000 UNITS TABS Take 1 tablet by mouth daily.      . Coconut Oil 1000 MG CAPS Take 4 capsules by mouth daily.      . Coenzyme Q10 (COQ10) 100 MG CAPS Take 1 tablet by mouth daily.      . Fluticasone-Salmeterol (ADVAIR DISKUS) 500-50 MCG/DOSE AEPB Inhale 1 puff into the lungs 2 (two) times daily.  180 each  3  . furosemide (LASIX) 40 MG tablet TAKE 1 TO 2 TABS BY MOUTH ONCE DAILY AS NEEDED FOR SWELLING  60 tablet  5  . Krill Oil 500 MG CAPS Take 1 capsule by mouth daily.      . Magnesium 400 MG CAPS Take 2 capsules by mouth daily.      . naproxen sodium (ANAPROX) 220 MG tablet Take 2 tabs by mouth once daily       . tiotropium (SPIRIVA) 18 MCG inhalation capsule Place 1 capsule (18 mcg total) into inhaler and inhale daily.  30 capsule  6  . traMADol (ULTRAM) 50 MG tablet TAKE 1 TABLET BY MOUTH  3 TIMES DAILY   AS NEEDED FOR PAIN  90 tablet  1  . vitamin C (ASCORBIC ACID) 250 MG tablet Take 250  mg by mouth daily.       No current facility-administered medications for this visit.    Allergies Penicillins  Family History: Family History  Problem Relation Age of Onset  . Cancer Mother   . Heart failure Father   . Heart attack Sister     Social History: History   Social History  . Marital Status: Single    Spouse Name: N/A    Number of Children: 1  . Years of Education: N/A   Occupational History  . Not on file.   Social History Main Topics  . Smoking status: Former Smoker -- 1.00 packs/day    Types: Cigarettes    Quit date: 03/03/1991  . Smokeless tobacco: Never Used  . Alcohol Use: No  . Drug Use: No  . Sexual Activity: Not on file   Other Topics Concern  . Not on file   Social History Narrative   Does not exercise   3 cups of coffee a day   Single- one child whom she gave up for  adoption at birth   And re-established contact in 2009 w/ new relationship and 2 grand daughters.     Electrocardiogram:  NSR normal ECG isolated PVC  Assessment and Plan

## 2013-05-25 NOTE — Assessment & Plan Note (Signed)
Does not like to see Dr Hassell Done anymore has behavioral Rx on [address] that she sees for counseling  Low carb diet

## 2013-05-25 NOTE — Patient Instructions (Signed)
Your physician recommends that you schedule a follow-up appointment in:  AS  NEEDED Your physician recommends that you continue on your current medications as directed. Please refer to the Current Medication list given to you today.  Your physician has requested that you have an echocardiogram. Echocardiography is a painless test that uses sound waves to create images of your heart. It provides your doctor with information about the size and shape of your heart and how well your heart's chambers and valves are working. This procedure takes approximately one hour. There are no restrictions for this procedure.  Your physician has requested that you have a lexiscan myoview. For further information please visit [email or web address]. Please follow instruction sheet, as given.  

## 2013-06-06 ENCOUNTER — Encounter: Payer: Self-pay | Admitting: Cardiology

## 2013-06-08 ENCOUNTER — Telehealth: Payer: Self-pay | Admitting: Pulmonary Disease

## 2013-06-08 ENCOUNTER — Encounter (HOSPITAL_COMMUNITY): Payer: Medicare Other

## 2013-06-08 MED ORDER — DOXYCYCLINE HYCLATE 100 MG PO CAPS: 100.0000 mg | ORAL_CAPSULE | Freq: Two times a day (BID) | ORAL | Status: DC

## 2013-06-08 NOTE — Telephone Encounter (Signed)
Spoke with the pt and notified of recs per TP  She verbalized understanding  Rx was sent in  She will call if not improving  Nothing further needed

## 2013-06-08 NOTE — Telephone Encounter (Signed)
Spoke with the pt  She is c/o "deep cough" x 3 days- prod with large amounts of thick, yellow sputum  Last night she started having increased SOB and wheezing  She states that this am she is getting out of breath walking minimal distance such as just from room to room at home  She is requesting ov today, nothing available until tomorrow but she wants something done today  She denies any CP, chest tightness, f/c/s  SN not here so will forward msg to TP for recs  Please advise thanks!

## 2013-06-08 NOTE — Telephone Encounter (Signed)
Per TP: since pt was seen recently for follow up, okay to call in Doxycycline 100mg  1 po BID x7 days, #14 with no refills.  Thanks.

## 2013-06-12 ENCOUNTER — Ambulatory Visit (HOSPITAL_COMMUNITY): Payer: Medicare Other | Attending: Cardiovascular Disease | Admitting: Radiology

## 2013-06-12 ENCOUNTER — Encounter: Payer: Self-pay | Admitting: Cardiovascular Disease

## 2013-06-12 DIAGNOSIS — R0602 Shortness of breath: Secondary | ICD-10-CM | POA: Diagnosis not present

## 2013-06-12 DIAGNOSIS — R0609 Other forms of dyspnea: Secondary | ICD-10-CM | POA: Diagnosis not present

## 2013-06-12 DIAGNOSIS — R0989 Other specified symptoms and signs involving the circulatory and respiratory systems: Secondary | ICD-10-CM | POA: Insufficient documentation

## 2013-06-12 DIAGNOSIS — R072 Precordial pain: Secondary | ICD-10-CM | POA: Diagnosis not present

## 2013-06-12 DIAGNOSIS — R079 Chest pain, unspecified: Secondary | ICD-10-CM | POA: Insufficient documentation

## 2013-06-12 DIAGNOSIS — R0600 Dyspnea, unspecified: Secondary | ICD-10-CM

## 2013-06-12 NOTE — Progress Notes (Signed)
Echocardiogram performed.  

## 2013-06-13 ENCOUNTER — Other Ambulatory Visit: Payer: Self-pay | Admitting: Pulmonary Disease

## 2013-06-16 ENCOUNTER — Telehealth: Payer: Self-pay | Admitting: Cardiovascular Disease

## 2013-06-16 NOTE — Telephone Encounter (Signed)
New Message  Pt called. Requests call back for ECHO results//SR

## 2013-06-16 NOTE — Telephone Encounter (Signed)
Pt is aware of echo results Debbie Knowledge Escandon RN  

## 2013-06-23 ENCOUNTER — Telehealth: Payer: Self-pay | Admitting: Cardiovascular Disease

## 2013-06-23 NOTE — Telephone Encounter (Signed)
ERROR

## 2013-06-26 ENCOUNTER — Encounter (HOSPITAL_COMMUNITY): Payer: Medicare Other

## 2013-09-08 DIAGNOSIS — M19049 Primary osteoarthritis, unspecified hand: Secondary | ICD-10-CM | POA: Diagnosis not present

## 2013-09-08 DIAGNOSIS — S92309A Fracture of unspecified metatarsal bone(s), unspecified foot, initial encounter for closed fracture: Secondary | ICD-10-CM | POA: Diagnosis not present

## 2013-10-15 ENCOUNTER — Other Ambulatory Visit: Payer: Self-pay | Admitting: Pulmonary Disease

## 2013-11-02 ENCOUNTER — Telehealth: Payer: Self-pay | Admitting: Pulmonary Disease

## 2013-11-02 NOTE — Telephone Encounter (Signed)
Rec'd from [facility] forward 2 pages to Dr. Lenna Gilford

## 2013-11-07 ENCOUNTER — Encounter: Payer: Self-pay | Admitting: Pulmonary Disease

## 2013-11-07 ENCOUNTER — Ambulatory Visit (INDEPENDENT_AMBULATORY_CARE_PROVIDER_SITE_OTHER): Payer: Medicare Other | Admitting: Pulmonary Disease

## 2013-11-07 VITALS — BP 140/80 | HR 85 | Temp 97.9°F | Ht 66.0 in | Wt 267.0 lb

## 2013-11-07 DIAGNOSIS — K589 Irritable bowel syndrome without diarrhea: Secondary | ICD-10-CM

## 2013-11-07 DIAGNOSIS — J449 Chronic obstructive pulmonary disease, unspecified: Secondary | ICD-10-CM | POA: Diagnosis not present

## 2013-11-07 DIAGNOSIS — I872 Venous insufficiency (chronic) (peripheral): Secondary | ICD-10-CM | POA: Diagnosis not present

## 2013-11-07 DIAGNOSIS — F341 Dysthymic disorder: Secondary | ICD-10-CM

## 2013-11-07 DIAGNOSIS — Z8542 Personal history of malignant neoplasm of other parts of uterus: Secondary | ICD-10-CM

## 2013-11-07 DIAGNOSIS — G4733 Obstructive sleep apnea (adult) (pediatric): Secondary | ICD-10-CM | POA: Diagnosis not present

## 2013-11-07 DIAGNOSIS — E039 Hypothyroidism, unspecified: Secondary | ICD-10-CM

## 2013-11-07 DIAGNOSIS — R7309 Other abnormal glucose: Secondary | ICD-10-CM | POA: Diagnosis not present

## 2013-11-07 DIAGNOSIS — K219 Gastro-esophageal reflux disease without esophagitis: Secondary | ICD-10-CM

## 2013-11-07 DIAGNOSIS — E559 Vitamin D deficiency, unspecified: Secondary | ICD-10-CM

## 2013-11-07 DIAGNOSIS — R7302 Impaired glucose tolerance (oral): Secondary | ICD-10-CM

## 2013-11-07 DIAGNOSIS — M1990 Unspecified osteoarthritis, unspecified site: Secondary | ICD-10-CM

## 2013-11-07 NOTE — Progress Notes (Signed)
HPI  Review of Systems  Physical Exam  Patient ID: Rebecca Rebecca [name], female   DOB: [birth year], 63 y.o.   MRN: [id number]  Subjective:    Patient ID: Rebecca Rebecca [name], female    DOB: [birth year], 63 y.o.   MRN: [id number]  HPI 63 y/o WF known to me w/ mult med problems as noted below...  ~  June 08, 2011:  12moROV & she did not pursue any of the diagnostic or therapeutic interventions we mentioned last OV (she refused 2DEcho, split night sleep study, formal pulm rehab program- "I can't afford the 20% copay"); her CC today is that she has a hard time breathing thru her nose (prob dev septum) & she wants ENT referral- OK;  She has Home O2, Advair500- Bid, Spiriva, Mucinex- 2Bid w/ Fluids, ProairHFA rescue inhaler;  She has the Helios liq O2 system but doesn't like this either- ?it "cakes" the phlegm in her nose?  She has Lasix469mtaking 1-2 daily but only using as needed for swelling she says;  She has Vicodin, Anaprox, Ultram, Flexeril for various pains and muscle spasm (she says she gets severe musc spasms every time she uses the Flutter valve to aide in mucocilliary clearance);  She notes that the Flexeril 17m80mid is "really great- it helps me breathe Rebecca [name]");  She has Zoloft 100m37mfor depression and Alprazolam 0.17mg 30m prn anxiety...  I have reiterated the need for sleep study/ CPAP, 2DEcho to eval for PulmHTN, and formal PulmRehab program as one of the most important interventions that will help her breathing etc;  She notes that she has to stop when ambulating from the bathroom to the kitchen, I feel pulm rehab is critically important to her, she declines & will not consider this intervention...  Today she wants ENT referral for her nose & wants another Endocrine referral for her Thyroid (recall prev insistence that she had thyroid dysfunction despite all normal TFTs & finally got an Endocrine in [place] ([name]) to prescribe thyroid hormone for her at her request)... Review of EPIC> last labs were  5/12 & these need updating...  CXR 3/13 showed borderline heart size, sl incr interstitial markings & left basilar Atx, lungs otherw clear, NAD, degen changes in TSpine...  ~  December 08, 2011:  52mo R31mo [name] saArtis DelayrEllison for Endocrine 4/13 but she is quite upset as he never addressed her Thyroid, just told her she had DM (A1c was 7.1) & prescribed Metformin500mg/d22mch she refused to fill & will not follow up w/ him; I offered another Endocrine eval, but she is thinking she might just ret to [name] in [place], she will let me know... We discussed "abnormal glucose tolerance" as a pre-diabetic diagnosis & the need for low carb wt reducing diet ...    She has had a severe emotional upset over the last 52mo> he74moster [name] hasOpal Sidlesn trying to get her evicted from her home (deceased mother's house that she gets to stay in during her life per Trust arrangement); [name] has been helping her w/ eating disorder & anxiety/depression & she tells me that she doesn't need meds- her alternative lifestyle functions to release endorphins that in turn stimulate her endocrine system to prevent depression she says...    Recall that [name] wanted to operate on her nose/sinuses/upper airway but we encouraged caution due to her severe airway dis/ COPD; [facility] "[facility]" as it was 40miles,76m0/wk &  she barely got to do any exercise on the machines; it was a greater effort to get to the rehab dept than to exercise when there, and she felt that she did more exercise at home doing her cleaning, cooking, laundry, bathing, caring for dogs, etc; she even painted a room at home (but has someone do her yard work); she feels the prob is that the nasal cannula doesn't help due to nasal obstruction & putting cannula in mouth is only a temp fix; we discussed the poss to transtracheal catheter & she is interested in pursuing this approach...     She further notes that she drinks a liver  detoxer and 1 gal of home carbonated water per day "I belch & it makes room for me to eat"; weight is up 9# to 289# and we reviewed low carb, low fat, wt reducing diet...    We reviewed prob list, meds, xrays and labs> see below for updates >> she declines flu vaccine...  ~  November 04, 2012:  65moROV & [name] has lost 8# down to 281# today but she continues w/ numerous complaints, nothing is Rebecca [name] she says, see below; We reviewed the following medical problems during today's office visit >>     Hx mild OSA w/ mod desat, loud snoring, & leg jerks (on sleep study 2005)> she tried CPAP10 but stopped on her own & declined to repeat split night study & re-try CPAP rx...    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 1993; supposed to be on HomeO2- 2L rest & 4L exercise, NEBS w/ Albut, Advair500Bid, Spiriva daily, Mucinex1-2Bid but not taking anything regularly; states that Flutter & Mucinex cause "spasms" in her back; states O2 doesn't help since she can't breathe thru her nose (she has been eval by ENT, CErnesto Rutherford; [facility] "fruitless"; states she "can't breathe at all, can't do anything" etc; SOB w/ ADLs=> O2 recert today (see below)... Note> [facility] and last PFT (2011) showed FEV1= 1.5 (55%) & %1sec=59...    Hx AtypCP> baseline EKG w/ NSSTTWA; prev 2DEcho w/ norm LVF, EF=50-55%, mild MR/TR, norm LA/RV; Myoview 2009 normal w/o ischemia & EF=55%...    Ven Insuffic> on Lasix40-2Qam; she knows not to eat sodium, elev legs, wear support hose...    Impaired Gluc Tolerance, Metabolic syndrome> she refused meds; prev eval by DrEllison w/ rec for Metformin Rx but she rejects the DM diagnosis & refused meds; states "I'm eating healthier" (advised low carb low fat), not checking sugars, prev labs w/ A1c=7.1    ?Hypothyroid> not currently on meds but she really wants thyroid Rx; prev TFTs all wnl; she prev had Endocrine in [place] prescribe Synthroid for her but she  stopped going; clinically & biochemically euthyroid...    Morbid Obesity> peak weight ~340# before lap band surg 2009 by DrMMartin; lost to ~250# but then back up to 280-90#; refused f/u w/ [facility] due to cost of visits & lap-band adjustments; we reviewed diet/ exercise...    GI- GERD, IBS> she uses OTC PPI as needed; last colonoscopy was 1982 by DrPatterson & wnl, she refuses GI referral & refuses f/u colon etc...    GYN- s/p uterine cancer> [facility]    DJD, ?FM, VitD defic> on Vicodin, Tramadol, Aleve, VitD, etc; prev eval by DrDeveshwar for rheum; now c/o pain in left knee (she thinks it's "siatica"), can't exercise & wants to see  [facility]- ok...    Anxiety/ Depression> on Alprazolam 0.69m tid prn; prev eval by Psychiatry w/ seasonal affective disorder; still sees Psychology [name] re: eating disorder; under considerable stress due to relationship w/ sister, financial pressure, etc... We reviewed prob list, meds, xrays and labs> see below for updates >> she take numerous herbs, supplements, etc- often rec to her by her alternative lifestyle acquaintances...      OXYGEN RE-CERTIFICATION:   92/2/97   1.  The pt has a continued need for home oxygen therapy due to her severe COPD/ emphysema...    2.  She is using the oxygen in the home w/ exercise (4L/min), and at night (2L/min)...    3.  O2 sats at rest on RA today=> 92% w/ pulse rate 82/min...     4.  Pt exercised in office on RA => after 2laps O2 sat nadir= 86% w/ pulse rate=105...    5.  Pt placed on O2 at 4L/min via [place] & exercise repeated => after 2laps O2 sat nadir= 92% w/ pulse rate=102  ~  May 05, 2013:  655moOV & when GiArtis Delayas seen last (9/14) she was placed on O2 but she was miserable- "couldn't do anything"; she tells me she started doing her own research & found out that Magnesium is very important (helps her leg cramps) and you can still be low w/ normal blood levels in fact everybody is deficient  so she started taking a special easily absorbed magnesium supplement and within 4H of the 1st dose her breathing was 100% Rebecca [name] & she feels that her breathing is cured, hasn't needed any oxygen (but wants to keep it just in case), and now she's walking, exercising, & she's lost 10#...  She also wanted to let me know about the benefits to [name] (horse linament) that she rubs into her knees and it works great, it's a great anti-inflamm & analgesic, doesn't need any Hydrocodone etc... Finally she wanted to let me know about 2 supplements that have been very helpful- "Clear Lungs" and "Lung tonic"...  We reviewed the following medical problems during today's office visit >>     Hx mild OSA w/ mod desat, loud snoring, & leg jerks (on sleep study 2005)> she tried CPAP10 but stopped on her own & declined to repeat split night study & re-try CPAP rx; she says she's sleeping satis & wakes feeling refreshed...    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 1993; supposed to be on HomeO2- 2L rest & 4L exercise, NEBS w/ Albut, Advair500Bid, Spiriva daily, Mucinex1-2Bid but not taking anything regularly; states that Flutter & Mucinex cause "spasms" in her back; states O2 doesn't help since she can't breathe thru her nose (she has been eval by ENT, CrErnesto Rutherford [facility] tried [facility] after 63m39mo "fruitless"; prev stated she "can't breathe at all, can't do anything" etc; SOB w/ ADLs etc; Note> [facility] and last PFT (2011) showed FEV1= 1.5 (55%) & %1sec=59 ==> 3/15 she announced that she's been cured by taking a Magnesium supplement, along w/ herbal formula "Clear lungs" and "Lung tonic"; she has stopped her Oxygen, still using Advair500 & Spiriva, using Mucinex 7 AlbutHFA just prn...     Hx AtypCP> baseline EKG w/ NSSTTWA; prev 2DEcho w/ norm LVF, EF=50-55%, mild MR/TR, norm LA/RV; Myoview 2009 normal w/o ischemia & EF=55%; she notes some atypCP & requests f/u cardiac eval (prev seen by DrWall)...     Ven Insuffic> on Lasix40-2Qam;  she knows not to eat sodium, elev legs, wear support hose; Labs 3/15 showed Cr=1.5 & she is asked to decr the Lasix to 24mQam...    Impaired Gluc Tolerance, Metabolic syndrome> she refused meds; prev eval by DrEllison w/ rec for Metformin Rx but she rejects the DM diagnosis & refused meds; states "I'm eating healthier" (advised low carb low fat); Labs 3/15 showed BS=95, A1c=6.8..Marland KitchenMarland Kitchen   ?Hypothyroid> not currently on meds but she really wants thyroid Rx; prev TFTs all wnl; she prev had Endocrine in [place] prescribe Synthroid for her but she stopped going; clinically & biochemically euthyroid...    Morbid Obesity> peak weight ~340# before lap band surg 2009 by DrMMartin; lost to ~250# but then back up to 280-90#; refused f/u w/ [facility] due to cost of visits & lap-band adjustments; 3/15 weight down to 270# & we reviewed diet & exercise...    GI- GERD, IBS> she uses OTC PPI as needed; last colonoscopy was 1982 by DrPatterson & wnl, she refuses GI referral & refuses f/u colon etc...    GYN- s/p uterine cancer> [facility]    DJD, ?FM, VitD defic> prev on Vicodin, Tramadol, Aleve, VitD; prev eval by DrDeveshwar for rheum; now she states that [name] ("horse linament") applied topically has worked like a miracle & she has increased exercise etc...    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 We reviewed prob list, meds, xrays and labs> see below for updates >> she refused the 2014 Flu vaccine... She has declined TDAP & Pneumovax as well...  CXR 3/15 showed underlying COPD, atelectasis in RML, DJD in TSpine, NAD; Rec to take OTC Mucinex600-2Bid w/ fluids...  LABS 3/15:  Chems- BS=95 A1c=6.8 (needs low carb diet, exercise, wt loss) & Creat=1.5 (mild renal insuffic  due to Lasix rx; Rec to decrease Lasix40 to 1 tab Qam...  ~  November 07, 2013:  61m102moV & [name] reports "I'm doing great, just a little arthritis in my mouse hand"; she also relates a lot of grief over the loss of her 9y/o Min-Pin dog Brixx who ran away several mo ago;  She reports that she participated in a medical study out of [place] regarding spacers for folks w/ COPD using MDIs, no ch in her meds required;  She remains on ADVAIR500Bid, SPIRIVA daily, & ProairHFA prn (uses 3-4 x daily "It really helps");  She has Home O2 but dislikes the Liq O2 therefore not using & she wants a portable O2 concentrator to read 4L/min w/ exercise, 2L/min at rest;  She still doesn't like the nasal cannula, doesn't feel that it gets into her system but like the face mask attachment because she can feel it Rebecca [name]- of course we discussed all these issues (again)...     She has continued to research her supplements and herbal meds>  She tells me that she is living on smoothies she makes out of juice, ice, baking soda ("only a tiny bit"), pomegranate syrup, & local honey;  Tumeric helps the arthritis in her knees and she has decreased the amt of aleve she was using;  She notes that pinching her upper lip in the middle under her nose helps relieve musc cramps in her legs;  She states that she hasn't had any palpit or skip beats since starting on a  Magnesium supplement...    She still sees [name] for help w/ her eating disorder but was a bit upset that he didn't have a diet handout for her;  We gave her the papers we have here to go by... We reviewed prob list, meds, xrays and labs> see below for updates >> she declines the 2015 Flu vaccine today...  Ambulatory O2 Test 9/15>  O2 sat on RA at rest= 92% w/ pulse=86;  Nadir O2sat on RA after 2 laps= 84% w/ pulse=96...            Problem List:    OBSTRUCTIVE SLEEP APNEA (ICD-327.23) - sleep study 2005 showed RDI=15 w/ desat to 78%, loud snoring & +leg jerks> on CPAP 10, prev used  intermittently but now not at all... ~  11/12:  She has agreed to a new Sleep Study- split night protocol, to recheck her OSA & determine CPAP needs (check ABG if poss too)==> she never followed thru w/ the study. ~  Pt states that she is resting satis and wakes refreshed, energy Rebecca [name]...  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 supposed to be on NEBULIZER Qid, ADVAIR 500Bid + SPIRIVA daily (she freq stops all meds) ==> she had a [facility] by DrAdair in 2006... ~  A1AT level is normal 218 (83-200) 3/09... ~  baseline CXR w/ borderline Cor, COPD, interstitial scarring/ atelectasis/ obesity... ~  PFTs 3/07 showed FVC=2.74 (82%), FEV1=1.67 (62%), %1sec=61, mid-flows=27%pred... improved from 2005. ~  CTChest in 2007 & 4/09 showed severe centrilob emyphysema, + interstitial fibrosis, sm HH, left renal cyst... neg for PE... ~  2010:  breathing improved w/ weight reduction after Lap-band surg... ~  2/11: COPD exac w/ neg CXR (chr changes, NAD), Rx w/ Depo/ Pred/ Avelox/ Mucinex/ NEBS/ Advair/ Spiriva/ etc... ~  PFTs 3/11 showed FVC= 2.57 (73%), FEV1= 1.51 (55%), %1sec=59, mid-flows= 21%pred. ~  Noncompliant w/ Rx & O2 thru 2012... On disability since 2011 & finally got Medicare coverage 9/12... ~  11/12: presents w/ worsening breathing & dyspnea w/ ADLs ==> we outlined further eval w/ 2DEcho & Sleep Study; plus Rx w/ NEBS QID, Advair500Bid, Spiriva daily, Mucinex, Oxygen, Lasix, Zoloft, Xanax, etc (she declined to proceed w/ the sleep study, 2DEcho, etc)... 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  ~  4/13: she is c/o the nasal O2 not helping since it's hard to breathe thru nose & wants ENT referral==> saw [name] who wanted to do surg but she has severe COPD & hi risk; asked to start PulmRehab & she agrees... ~  10/13: she did PulmRehab for 44mo6-7/13 but quit since  it was "fruitless"- barely got to do any exercise since it was so difficult getting to the facility,etc; still c/o nasal problems limiting her benefit from O2 & we discussed the poss of ?transtracheal oxygen? ~  9/14: exsmoker quit 1993; supposed to be on HomeO2- 2L rest & 4L exercise, NEBS w/ Albut, Advair500Bid, Spiriva daily, Mucinex1-2Bid but not taking anything regularly; states that Flutter & Mucinex cause "spasms" in her back; states O2 doesn't help since she can't breathe thru her nose (she has been eval by ENT, CErnesto Rutherford; [facility] tried [facility] after [facility]mos "fruitless"; states she "can't breathe at all, can't do anything" etc; SOB w/ ADLs=> O2 recert today (see below)... Note> [facility] and last PFT (2011) showed  FEV1= 1.5 (55%) & %1sec=59... OXYGEN RE-CERT done today... ~  3/15: see above- pt states that a magnesium supplement along w/ herbal supplements "clear lungs" and "lung tonic" have made all the difference & she no longer needs oxygen, exercising regularly & much improved; still taking Advair500, Spiriva, AlbutHFA prn...  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 ~  9/15: Ambulatory O2 Test 9/15> O2 sat on RA at rest= 92% w/ pulse=86;  Nadir O2sat on RA after 2 laps= 84% w/ pulse=96...    ATYPICAL CHEST PAIN (ICD-786.50) - eval by DrWall in 2009. ~  baseline EKG w/ NSR, PVC's, NSSTTWA, NAD... ~  2DEcho 2/05 showed mild MR/ TR, norm LA/ RV, EF=50-55%... ~  NuclearStressTest 4/09 was norm- no ischemia, EF=55%... similiar to prev studies [facility]... ~  3/15: she notes some atypCP & requests a cardiac referral for eval- prev seen by DrWall, we will call for Cards consult per her request... ~  EKG 3/15 showed NSR, rate91, occas PVCs, otherw wnl... ~  4/15: she had Cards eval DrNishan> he did not feel that invasive testing was warranted; she had 2DEcho but refused  Myoview due to co-pay costs...  ~  2DEcho 4/15 showed norm LV sys function w/ EF=60-65%, normal wall motion, Gr1DD, normal valves, normal RV & PA pressures...   VENOUS INSUFFICIENCY, CHRONIC (ICD-459.81) - Hx chr ven insuffic w/ edema... on LASIX 63m- 1-2 daily (she self medicates)... ~  Labs 3/15 showed Cr=1.5 and she is asked to decr the Lasix to 414mQam...  IMPAIRED GLUCOSE TOLERANCE >>  METABOLIC SYNDROME X (ICJKK-938.1- she is on diet alone (refused Metformin therapy)... ~  labs 9/08 showed BS=120... FBS 5/08 was 93, HgA1c=7.1 ~  labs 5/10 showed BS= 182, A1c= 6.4 ~  labs 2/11 showed BS= 76, A1c= 6.5 ~  Labs 5/12 showed BS= 103, A1c= 6.5 ~  She saw DrEllison 4/13 w/ A1c=7.1 but she rejects the diagnosis of Diabetes & refused Metform50061m... ~  10/13:  We discussed IMPAIRED GLUCOSE TOLERANCE & need for low carb wt reducing diet and incr exercise... ~  9/14: he refused meds; prev eval by DrEllison w/ rec for Metformin Rx but she rejects the DM diagnosis & refused meds; states "I'm eating healthier" (advised low carb low fat), not checking sugars, prev labs w/ A1c=7.1  ~  3/15: on diet alone; Labs 3/15 showed BS= 95, A1c= 6.8  MORBID OBESITY (ICD-278.01) - she prev saw [name] for counselling about her eating disorder... ~  max weight ~340# before lap band surg 7/09 by DrMartin. ~  weight 12/09 = 291# ~  weight 5/10 = 265# ~  weight 11/10- = 254# ~  weight 2/11 = 260# ~  Weight 5/12= 290# ~  Weight 11/12 = 286# ~  Weight 4/13 = 280# ==> BMI=45 ~  Weight 10/13 = 289# ~  Weight 9/14 = 281# ~  Weight 3/15 = 270# ~  Weight 9/15 = 267#  ? Hx HYPOTHYROIDISM (ICD-244.9) - off thyroid meds since 1/10... she was started on Synthroid and followed by DrRSmith in [place] & last seen 7/09- note reviewed... she refuses f/u by [name]- we will recheck TSH off Synthroid Rx... ~  labs 5/10 off Synthroid since 1/10 showed TSH= 1.13 ~  labs 2/11 showed TSH= 1.82 ~  Labs 5/12 showed TSH=  1.81 ~  2013: She wanted to restart Thyroid hormone but TSH remains  normal> referred to Endocrine for further eval but she was upset w/ DrEllison's eval; she will decide if she wants to pursue another opinion... ~  9/14: not currently on meds but she really wants thyroid Rx; prev TFTs all wnl; she prev had Endocrine in [place] prescribe Synthroid for her but she stopped going; clinically & biochemically euthyroid...  ~  3/15: on Synthroid10-12 tab daily; Labs showed TSH= 3.00  GERD (ICD-530.81) - prev on Nexium but off all meds since 1/10... IRRITABLE BOWEL SYNDROME (ICD-564.1) - colonoscopy 1982 by DrPatterson was WNL... She has refused f/u GI eval & f/u colonoscopy...  ?KIDNEY STONE Hx >> pt said she passed a kidney stone 3/11 w/o any medical attention, w/o work up or proof of same; entry made here for future reference if needed.  UTERINE CANCER, HX OF (ICD-V10.42) - s/p laparoscopic hysterectomy & BSO 7/07 by DrSkinner at [place]...   DEGENERATIVE JOINT DISEASE (ICD-715.90) - on VICODIN tid prn,  ANAPROX 27m Bid prn, ULTRAM 521mPrn... ? 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 she was on Wellbutrin & Vicodin when last seen in 2006... VITAMIN D DEFICIENCY (ICD-268.9) - Vit D level 5/10 = 16... rec> start OTC Vit D 2000 u daily. ~  9/14: on Vicodin, Tramadol, Aleve, VitD, etc; prev eval by DrDeveshwar for rheum; now c/o pain in left knee (she thinks it's "siatica"), can't exercise & wants to [facility]- ok... ~  3/15: she states that topical [name] ("horse linament") rubbed into knees really helps but she wants to keep the Vicodinon hand just in case...  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 she has [place]/ [place]... prev seeing psychologist [name] for eating  disorder counselling... ~  4/13: she mentioned seeing [name] again for eating disorder & counseling for depression etc... ~  9/14: on Alprazolam 0.34mg101md prn; prev eval by Psychiatry w/ seasonal affective disorder; still sees Psychology [name] re: eating disorder; under considerable stress due to relationship w/ sister, financial pressure, etc...  ~  3/15: she has the Alpraz0.34mg 13m prn use but seldom uses it; still sees [name] on occas but doesn't think she needs to continue...  HEALTH MAINTENANCE:   ~  GI:  Per seen by DrPatterson but last colon was 1982 & she refuses follow up... ~  GYN:  [facility]:  She had Pneumovax in the past; she declines Flu shots and Tetanus shots...   Past Surgical History  Procedure Laterality Date  . 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 Appendectomy    . Cholecystectomy  1982  . [place]  . [place]  [place]  . Laparoscopic gastric banding  08/2007    Dr. MartiHassell Doneutpatient Encounter Prescriptions as of 11/07/2013  Medication Sig  . albuterol (PROAIR HFA) 108 (90 BASE) MCG/ACT inhaler Inhale 2 puffs into the lungs every 6 (six) hours as needed.  . ALPRAZolam (XANAX) 0.5 MG tablet TAKE 1/2 TO 1 TABLET BY MOUTH 3 TIMES DAILY AS NEEDED FOR NERVES. NOT TO EXCEED 3 PER DAY  . B Complex-C-Folic Acid (B-COMPLEX BALANCED) TABS Take 1 tablet by mouth daily.  .Marland Kitchen  Bee Pollen 1000 MG TABS Take 2 tablets by mouth daily.  . cetirizine (ZYRTEC) 10 MG tablet Take 10 mg by mouth daily.    . Cholecalciferol (VITAMIN D-3) 5000 UNITS TABS Take 1 tablet by mouth daily.  . Coconut Oil 1000 MG CAPS Take 4 capsules by mouth daily.  . Coenzyme Q10 (COQ10) 100 MG CAPS Take 1 tablet by mouth daily.  . Fluticasone-Salmeterol (ADVAIR DISKUS) 500-50 MCG/DOSE AEPB Inhale 1 puff into the lungs 2 (two) times daily.  . furosemide (LASIX) 40 MG tablet TAKE 1 TO 2  TABS BY MOUTH ONCE DAILY AS NEEDED FOR SWELLING  . Krill Oil 500 MG CAPS Take 1 capsule by mouth daily.  . Magnesium 400 MG CAPS Take 2 capsules by mouth daily.  . naproxen sodium (ANAPROX) 220 MG tablet Take 2 tabs by mouth once daily   . SPIRIVA HANDIHALER 18 MCG inhalation capsule PLACE 1 CAPSULE (18 MCG TOTAL) INTO INHALER AND INHALE DAILY.  . traMADol (ULTRAM) 50 MG tablet TAKE 1 TABLET BY MOUTH  3 TIMES DAILY   AS NEEDED FOR PAIN  . vitamin C (ASCORBIC ACID) 250 MG tablet Take 250 mg by mouth daily.  . [DISCONTINUED] doxycycline (VIBRAMYCIN) 100 MG capsule Take 1 capsule (100 mg total) by mouth 2 (two) times daily.    Allergies  Allergen Reactions  . Penicillins     REACTION: nausea and vomiting    Current Medications, Allergies, Past Medical History, Past Surgical History, Family History, and Social History were reviewed in [employer] record.   Review of Systems         See HPI - all other systems neg except as noted... The patient complains of dyspnea on exertion and difficulty walking; chest discomfort, & leg weakness.  The patient denies anorexia, fever, weight loss, weight gain, vision loss, decreased hearing, hoarseness, syncope, prolonged cough, headaches, hemoptysis, abdominal pain, melena, hematochezia, severe indigestion/heartburn, hematuria, incontinence, suspicious skin lesions, transient blindness, depression, unusual weight change, abnormal bleeding, enlarged lymph nodes, and angioedema.    Objective:   Physical Exam    WD, Morbidly Obese, 63 y/o WF in NAD... GENERAL:  Alert & oriented; pleasant & cooperative; she is in Rebecca [name] spirits today. HEENT:  [place]/AT, EOM-wnl, PERRLA, EACs-clear, TMs-wnl, NOSE-clear, THROAT-clear & wnl. NECK:  Supple w/ fairROM; no JVD; normal carotid impulses w/o bruits; no thyromegaly or nodules palpated; no lymphadenopathy. CHEST:  decr BS bilat; w/ silent chest- no wheezing, rales, or rhonchi... HEART:  Regular  Rhythm; without murmurs/ rubs/ or gallops heard... ABDOMEN:  Obese, soft & nontender; normal bowel sounds; no organomegaly or masses detected. EXT: without deformities, mod arthritic changes; no varicose veins/ +venous insuffic/trace edema. NEURO:  CNs intact; no focal neuro deficits... DERM: few ecchymoses, no rash etc...  RADIOLOGY DATA:  Reviewed in the EPIC EMR & discussed w/ the patient...  LABORATORY DATA:  Reviewed in the EPIC EMR & discussed w/ the patient...   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 3/15> she now feels she's been cured by a Magnesium supplement & herbal preps- "Clear lungs, and Lung tonic"... She declined to recheck O2 sats w/ ambulation in the office...  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  OSA>  As noted she needs new sleep study & appropriate new CPAP apparatus to facilitate compliance but she declines the split night study...  COPD/ Emphysema>  Encouraged to stay on her O2 regularly, continue NEBS, Advair/ Spiriva regularly & use the Mucinex; needs to lose wt & incr exercise...  Hx CP w/ neg cardiac evals including several Myoviews; we wanted to check  2DEcho to screen for [facility] but she declined... 3/15> she requests Cardiology consult for eval of AtypCP...  OBESITY>  She had remote lap band surg by DrMMartin; Met syndrome, must get wt down etc; she is a lap band failure & prev counseling by [name]...  ? Hx Hypothy>  TFTs remain normal off meds- no problem...  GI>  GERD> she denies symptoms & uses OTC meds prn...  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  Dysthymia>  Much stress in her life, on Alpraz prn; has embraced alt lifestyle...   Patient's Medications  New Prescriptions   No medications on file  Previous Medications   ALBUTEROL (PROAIR HFA) 108 (90 BASE) MCG/ACT INHALER    Inhale 2 puffs into the lungs every 6 (six) hours as needed.   ALPRAZOLAM (XANAX) 0.5 MG TABLET    TAKE 1/2 TO 1 TABLET BY MOUTH 3 TIMES DAILY AS NEEDED FOR NERVES. NOT TO EXCEED 3 PER DAY   B COMPLEX-C-FOLIC ACID (B-COMPLEX BALANCED) TABS    Take 1 tablet by mouth daily.   BEE POLLEN 1000 MG TABS    Take 2 tablets by mouth daily.   CETIRIZINE (ZYRTEC) 10 MG TABLET    Take 10 mg by mouth daily.     CHOLECALCIFEROL (VITAMIN D-3) 5000 UNITS TABS    Take 1 tablet by mouth daily.   COCONUT OIL 1000 MG CAPS    Take 4 capsules by mouth daily.   COENZYME Q10 (COQ10) 100 MG CAPS    Take 1 tablet by mouth daily.   FLUTICASONE-SALMETEROL (ADVAIR DISKUS) 500-50 MCG/DOSE AEPB    Inhale 1 puff into the lungs 2 (two) times daily.   FUROSEMIDE (LASIX) 40 MG TABLET    TAKE 1 TO 2 TABS BY MOUTH ONCE DAILY AS NEEDED FOR SWELLING   KRILL OIL 500 MG CAPS    Take 1 capsule by mouth daily.   MAGNESIUM 400 MG CAPS    Take 2 capsules by mouth daily.   NAPROXEN SODIUM (ANAPROX) 220 MG TABLET    Take 2 tabs by mouth once daily    SPIRIVA HANDIHALER 18 MCG INHALATION CAPSULE    PLACE 1 CAPSULE (18 MCG TOTAL) INTO INHALER AND INHALE DAILY.   TRAMADOL (ULTRAM) 50 MG TABLET    TAKE 1 TABLET BY MOUTH  3 TIMES DAILY   AS NEEDED FOR PAIN   VITAMIN C  (ASCORBIC ACID) 250 MG TABLET    Take 250 mg by mouth daily.  Modified Medications   No  medications on file  Discontinued Medications   DOXYCYCLINE (VIBRAMYCIN) 100 MG CAPSULE    Take 1 capsule (100 mg total) by mouth 2 (two) times daily.

## 2013-11-07 NOTE — Patient Instructions (Signed)
Today we updated your med list in our EPIC system...    Continue your current medications the same...  Continue your breathing meds the same...  Continue to work on weight reduction as this will also help your breathing...  We will order a portable OXYGEN concentrator for you...  Call for any questions...  Let's plan a follow up visit in 51mo, sooner if needed for problems.Marland KitchenMarland Kitchen

## 2013-11-16 ENCOUNTER — Telehealth: Payer: Self-pay | Admitting: Pulmonary Disease

## 2013-11-16 NOTE — Telephone Encounter (Signed)
Spoke with the pt  She states that she developed bronchitis approx  1 wk ago  She is getting better as far as chest tightness and other symptoms, but cough still bothering her at night  She states coughing up large amounts of clear to white sputum  She states that she is taking mucinex 1200 mg bid and also taking robitussin chest and cold syrup  She is asking for recs and states "I don't want an abx or prednisone, but something to dry me up" SN- please advise, thanks!

## 2013-11-16 NOTE — Telephone Encounter (Signed)
Called and spoke with pt and she is aware of SN recs to alternate the allegra and the zyrtec.  Pt is aware and nothing further is needed.

## 2013-11-16 NOTE — Telephone Encounter (Signed)
Called mobile # line busy x3 Called pt home #. She reports she does not want cough syrup bc she does not have a bad cough. She reports she is taking zyrtec daily. Wants to know if she can take allegra with zyrtec as well? Please advise thanks

## 2013-11-16 NOTE — Telephone Encounter (Signed)
Per SN---  rec using antihistamine OTC allegra 180 mg  1 daily. Does she need cough syrup?  If so we can give her hycodan 6 oz  1 tsp every 6 hours as needed.  thanks

## 2013-11-16 NOTE — Telephone Encounter (Signed)
ATC x 2 line busy, WCB 

## 2013-11-17 ENCOUNTER — Telehealth: Payer: Self-pay | Admitting: Pulmonary Disease

## 2013-11-17 ENCOUNTER — Other Ambulatory Visit (INDEPENDENT_AMBULATORY_CARE_PROVIDER_SITE_OTHER): Payer: Medicare Other

## 2013-11-17 ENCOUNTER — Encounter: Payer: Self-pay | Admitting: Pulmonary Disease

## 2013-11-17 ENCOUNTER — Ambulatory Visit (INDEPENDENT_AMBULATORY_CARE_PROVIDER_SITE_OTHER)
Admission: RE | Admit: 2013-11-17 | Discharge: 2013-11-17 | Disposition: A | Discharge status: Home / Self Care | Payer: Medicare Other | Source: Ambulatory Visit | Attending: Pulmonary Disease | Admitting: Pulmonary Disease

## 2013-11-17 ENCOUNTER — Ambulatory Visit (INDEPENDENT_AMBULATORY_CARE_PROVIDER_SITE_OTHER): Payer: Medicare Other | Admitting: Pulmonary Disease

## 2013-11-17 VITALS — BP 150/82 | HR 101 | Ht 66.0 in | Wt 267.0 lb

## 2013-11-17 DIAGNOSIS — J438 Other emphysema: Secondary | ICD-10-CM | POA: Diagnosis not present

## 2013-11-17 DIAGNOSIS — J449 Chronic obstructive pulmonary disease, unspecified: Secondary | ICD-10-CM

## 2013-11-17 DIAGNOSIS — G4733 Obstructive sleep apnea (adult) (pediatric): Secondary | ICD-10-CM

## 2013-11-17 DIAGNOSIS — E8881 Metabolic syndrome: Secondary | ICD-10-CM

## 2013-11-17 DIAGNOSIS — R059 Cough, unspecified: Secondary | ICD-10-CM | POA: Diagnosis not present

## 2013-11-17 DIAGNOSIS — M1990 Unspecified osteoarthritis, unspecified site: Secondary | ICD-10-CM

## 2013-11-17 DIAGNOSIS — R05 Cough: Secondary | ICD-10-CM | POA: Diagnosis not present

## 2013-11-17 DIAGNOSIS — J45901 Unspecified asthma with (acute) exacerbation: Secondary | ICD-10-CM

## 2013-11-17 DIAGNOSIS — F341 Dysthymic disorder: Secondary | ICD-10-CM

## 2013-11-17 DIAGNOSIS — R7309 Other abnormal glucose: Secondary | ICD-10-CM

## 2013-11-17 DIAGNOSIS — J4489 Other specified chronic obstructive pulmonary disease: Secondary | ICD-10-CM

## 2013-11-17 DIAGNOSIS — E119 Type 2 diabetes mellitus without complications: Secondary | ICD-10-CM | POA: Diagnosis not present

## 2013-11-17 DIAGNOSIS — R7302 Impaired glucose tolerance (oral): Secondary | ICD-10-CM

## 2013-11-17 DIAGNOSIS — K219 Gastro-esophageal reflux disease without esophagitis: Secondary | ICD-10-CM

## 2013-11-17 DIAGNOSIS — E039 Hypothyroidism, unspecified: Secondary | ICD-10-CM

## 2013-11-17 LAB — CBC WITH DIFFERENTIAL/PLATELET
BASOS PCT: 0.5 % (ref 0.0–3.0)
Basophils Absolute: 0.1 10*3/uL (ref 0.0–0.1)
EOS PCT: 4.3 % (ref 0.0–5.0)
Eosinophils Absolute: 0.5 10*3/uL (ref 0.0–0.7)
HEMATOCRIT: 48.2 % — AB (ref 36.0–46.0)
Hemoglobin: 16 g/dL — ABNORMAL HIGH (ref 12.0–15.0)
Lymphocytes Relative: 25 % (ref 12.0–46.0)
Lymphs Abs: 3 10*3/uL (ref 0.7–4.0)
MCHC: 33.2 g/dL (ref 30.0–36.0)
MCV: 90.6 fl (ref 78.0–100.0)
MONO ABS: 1.1 10*3/uL — AB (ref 0.1–1.0)
Monocytes Relative: 9.1 % (ref 3.0–12.0)
Neutro Abs: 7.4 10*3/uL (ref 1.4–7.7)
Neutrophils Relative %: 61.1 % (ref 43.0–77.0)
Platelets: 320 10*3/uL (ref 150.0–400.0)
RBC: 5.32 Mil/uL — ABNORMAL HIGH (ref 3.87–5.11)
RDW: 14.9 % (ref 11.5–15.5)
WBC: 12.1 10*3/uL — AB (ref 4.0–10.5)

## 2013-11-17 LAB — BASIC METABOLIC PANEL
BUN: 19 mg/dL (ref 6–23)
CHLORIDE: 103 meq/L (ref 96–112)
CO2: 30 mEq/L (ref 19–32)
CREATININE: 1.4 mg/dL — AB (ref 0.4–1.2)
Calcium: 9.2 mg/dL (ref 8.4–10.5)
GFR: 41.11 mL/min — ABNORMAL LOW (ref >60.00)
Glucose, Bld: 119 mg/dL — ABNORMAL HIGH (ref 70–99)
POTASSIUM: 4.7 meq/L (ref 3.5–5.1)
Sodium: 141 mEq/L (ref 135–145)

## 2013-11-17 LAB — HEMOGLOBIN A1C: Hgb A1c MFr Bld: 6.7 % — ABNORMAL HIGH (ref 4.6–6.5)

## 2013-11-17 MED ORDER — METHYLPREDNISOLONE ACETATE 80 MG/ML IJ SUSP
120.0000 mg | Freq: Once | INTRAMUSCULAR | Status: AC
  Administered 2013-11-17: 120 mg via INTRAMUSCULAR

## 2013-11-17 MED ORDER — PREDNISONE 20 MG PO TABS: ORAL_TABLET | ORAL | Status: DC

## 2013-11-17 MED ORDER — LEVALBUTEROL HCL 1.25 MG/3ML IN NEBU
1.2500 mg | INHALATION_SOLUTION | Freq: Two times a day (BID) | RESPIRATORY_TRACT | Status: DC
Start: 2013-11-17 — End: 2014-05-07

## 2013-11-17 MED ORDER — ALBUTEROL SULFATE (2.5 MG/3ML) 0.083% IN NEBU: 2.5000 mg | INHALATION_SOLUTION | Freq: Four times a day (QID) | RESPIRATORY_TRACT | Status: DC

## 2013-11-17 MED ORDER — LEVALBUTEROL HCL 0.63 MG/3ML IN NEBU
0.6300 mg | INHALATION_SOLUTION | Freq: Once | RESPIRATORY_TRACT | Status: DC
Start: 2013-11-17 — End: 2013-11-17

## 2013-11-17 MED ORDER — LEVALBUTEROL HCL 0.63 MG/3ML IN NEBU
0.6300 mg | INHALATION_SOLUTION | Freq: Once | RESPIRATORY_TRACT | Status: AC
  Administered 2013-11-17 (×2): 0.63 mg via RESPIRATORY_TRACT

## 2013-11-17 MED ORDER — BUDESONIDE 0.5 MG/2ML IN SUSP: 0.5000 mg | Freq: Two times a day (BID) | RESPIRATORY_TRACT | Status: DC

## 2013-11-17 NOTE — Progress Notes (Signed)
HPI  Review of Systems  Physical Exam  Patient ID: Rebecca [name], female   DOB: Oct 09, 1950, 63 y.o.   MRN: [id number]  Subjective:    Patient ID: Rebecca [name], female    DOB: 03-18-1950, 63 y.o.   MRN: [id number]  HPI 63 y/o WF known to me w/ mult med problems as noted below...  ~  June 08, 2011:  181moROV & she did not pursue any of the diagnostic or therapeutic interventions we mentioned last OV (she refused 2DEcho, split night sleep study, formal pulm rehab program- "I can't afford the 20% copay"); her CC today is that she has a hard time breathing thru her nose (prob dev septum) & she wants ENT referral- OK;  She has Home O2, Advair500- Bid, Spiriva, Mucinex- 2Bid w/ Fluids, ProairHFA rescue inhaler;  She has the Helios liq O2 system but doesn't like this either- ?it "cakes" the phlegm in her nose?  She has Lasix432mtaking 1-2 daily but only using as needed for swelling she says;  She has Vicodin, Anaprox, Ultram, Flexeril for various pains and muscle spasm (she says she gets severe musc spasms every time she uses the Flutter valve to aide in mucocilliary clearance);  She notes that the Flexeril 81m71mid is "really great- it helps me breathe [name]");  She has Zoloft 100m2mfor depression and Alprazolam 0.81mg 58m prn anxiety...  I have reiterated the need for sleep study/ CPAP, 2DEcho to eval for PulmHTN, and formal PulmRehab program as one of the most important interventions that will help her breathing etc;  She notes that she has to stop when ambulating from the bathroom to the kitchen, I feel pulm rehab is critically important to her, she declines & will not consider this intervention...  Today she wants ENT referral for her nose & wants another Endocrine referral for her Thyroid (recall prev insistence that she had thyroid dysfunction despite all normal TFTs & finally got an Endocrine in [place] ([name]) to prescribe thyroid hormone for her at her request)... Review of EPIC> last labs were  5/12 & these need updating...  CXR 3/13 showed borderline heart size, sl incr interstitial markings & left basilar Atx, lungs otherw clear, NAD, degen changes in TSpine...  ~  December 08, 2011:  79mo R7mo [name] saArtis DelayrEllison for Endocrine 4/13 but she is quite upset as he never addressed her Thyroid, just told her she had DM (A1c was 7.1) & prescribed Metformin500mg/d74mch she refused to fill & will not follow up w/ him; I offered another Endocrine eval, but she is thinking she might just ret to [name] in [place], she will let me know... We discussed "abnormal glucose tolerance" as a pre-diabetic diagnosis & the need for low carb wt reducing diet ...    She has had a severe emotional upset over the last 79mo> he679moster [name] hasOpal Sidlesn trying to get her evicted from her home (deceased mother's house that she gets to stay in during her life per Trust arrangement); [name] has been helping her w/ eating disorder & anxiety/depression & she tells me that she doesn't need meds- her alternative lifestyle functions to release endorphins that in turn stimulate her endocrine system to prevent depression she says...    Recall that [name] wanted to operate on her nose/sinuses/upper airway but we encouraged caution due to her severe airway dis/ COPD; [facility]" as it was 40miles,58m0/wk &  she barely got to do any exercise on the machines; it was a greater effort to get to the rehab dept than to exercise when there, and she felt that she did more exercise at home doing her cleaning, cooking, laundry, bathing, caring for dogs, etc; she even painted a room at home (but has someone do her yard work); she feels the prob is that the nasal cannula doesn't help due to nasal obstruction & putting cannula in mouth is only a temp fix; we discussed the poss to transtracheal catheter & she is interested in pursuing this approach...     She further notes that she drinks a liver  detoxer and 1 gal of home carbonated water per day "I belch & it makes room for me to eat"; weight is up 9# to 289# and we reviewed low carb, low fat, wt reducing diet...    We reviewed prob list, meds, xrays and labs> see below for updates >> she declines flu vaccine...  ~  November 04, 2012:  56moROV & [name] has lost 8# down to 281# today but she continues w/ numerous complaints, nothing is [name] she says, see below; We reviewed the following medical problems during today's office visit >>     Hx mild OSA w/ mod desat, loud snoring, & leg jerks (on sleep study 2005)> she tried CPAP10 but stopped on her own & declined to repeat split night study & re-try CPAP rx...    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 1993; supposed to be on HomeO2- 2L rest & 4L exercise, NEBS w/ Albut, Advair500Bid, Spiriva daily, Mucinex1-2Bid but not taking anything regularly; states that Flutter & Mucinex cause "spasms" in her back; states O2 doesn't help since she can't breathe thru her nose (she has been eval by ENT, CErnesto Rutherford; [facility] "[facility]"; states she "can't breathe at all, can't do anything" etc; SOB w/ ADLs=> O2 recert today (see below)... Note> [facility] and last PFT (2011) showed FEV1= 1.5 (55%) & %1sec=59...    Hx AtypCP> baseline EKG w/ NSSTTWA; prev 2DEcho w/ norm LVF, EF=50-55%, mild MR/TR, norm LA/RV; Myoview 2009 normal w/o ischemia & EF=55%...    Ven Insuffic> on Lasix40-2Qam; she knows not to eat sodium, elev legs, wear support hose...    Impaired Gluc Tolerance, Metabolic syndrome> she refused meds; prev eval by DrEllison w/ rec for Metformin Rx but she rejects the DM diagnosis & refused meds; states "I'm eating healthier" (advised low carb low fat), not checking sugars, prev labs w/ A1c=7.1    ?Hypothyroid> not currently on meds but she really wants thyroid Rx; prev TFTs all wnl; she prev had Endocrine in [place] prescribe Synthroid for her but she  stopped going; clinically & biochemically euthyroid...    Morbid Obesity> peak weight ~340# before lap band surg 2009 by DrMMartin; lost to ~250# but then back up to 280-90#; refused f/u w/ [facility] due to cost of visits & lap-band adjustments; we reviewed diet/ exercise...    GI- GERD, IBS> she uses OTC PPI as needed; last colonoscopy was 1982 by DrPatterson & wnl, she refuses GI referral & refuses f/u colon etc...    GYN- s/p uterine cancer> [facility]    DJD, ?FM, VitD defic> on Vicodin, Tramadol, Aleve, VitD, etc; prev eval by DrDeveshwar for rheum; now c/o pain in left knee (she thinks it's "siatica"), can't exercise & wants to [facility]- ok...    Anxiety/ Depression> on Alprazolam 0.93m tid prn; prev eval by Psychiatry w/ seasonal affective disorder; still sees Psychology [name] re: eating disorder; under considerable stress due to relationship w/ sister, financial pressure, etc... We reviewed prob list, meds, xrays and labs> see below for updates >> she take numerous herbs, supplements, etc- often rec to her by her alternative lifestyle acquaintances...      OXYGEN RE-CERTIFICATION:   91/6/10   1.  The pt has a continued need for home oxygen therapy due to her severe COPD/ emphysema...    2.  She is using the oxygen in the home w/ exercise (4L/min), and at night (2L/min)...    3.  O2 sats at rest on RA today=> 92% w/ pulse rate 82/min...     4.  Pt exercised in office on RA => after 2laps O2 sat nadir= 86% w/ pulse rate=105...    5.  Pt placed on O2 at 4L/min via [place] & exercise repeated => after 2laps O2 sat nadir= 92% w/ pulse rate=102  ~  May 05, 2013:  649moOV & when GiArtis Delayas seen last (9/14) she was placed on O2 but she was miserable- "couldn't do anything"; she tells me she started doing her own research & found out that Magnesium is very important (helps her leg cramps) and you can still be low w/ normal blood levels in fact everybody is deficient  so she started taking a special easily absorbed magnesium supplement and within 4H of the 1st dose her breathing was 100% [name] & she feels that her breathing is cured, hasn't needed any oxygen (but wants to keep it just in case), and now she's walking, exercising, & she's lost 10#...  She also wanted to let me know about the benefits to [name] (horse linament) that she rubs into her knees and it works great, it's a great anti-inflamm & analgesic, doesn't need any Hydrocodone etc... Finally she wanted to let me know about 2 supplements that have been very helpful- "Clear Lungs" and "Lung tonic"...  We reviewed the following medical problems during today's office visit >>     Hx mild OSA w/ mod desat, loud snoring, & leg jerks (on sleep study 2005)> she tried CPAP10 but stopped on her own & declined to repeat split night study & re-try CPAP rx; she says she's sleeping satis & wakes feeling refreshed...    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 1993; supposed to be on HomeO2- 2L rest & 4L exercise, NEBS w/ Albut, Advair500Bid, Spiriva daily, Mucinex1-2Bid but not taking anything regularly; states that Flutter & Mucinex cause "spasms" in her back; states O2 doesn't help since she can't breathe thru her nose (she has been eval by ENT, CrErnesto Rutherford [facility] after 72m64mo "fruitless"; prev stated she "can't breathe at all, can't do anything" etc; SOB w/ ADLs etc; Note> [facility] and last PFT (2011) showed FEV1= 1.5 (55%) & %1sec=59 ==> 3/15 she announced that she's been cured by taking a Magnesium supplement, along w/ herbal formula "Clear lungs" and "Lung tonic"; she has stopped her Oxygen, still using Advair500 & Spiriva, using Mucinex 7 AlbutHFA just prn...     Hx AtypCP> baseline EKG w/ NSSTTWA; prev 2DEcho w/ norm LVF, EF=50-55%, mild MR/TR, norm LA/RV; Myoview 2009 normal w/o ischemia & EF=55%; she notes some atypCP & requests f/u cardiac eval (prev seen by DrWall)...     Ven Insuffic> on Lasix40-2Qam;  she knows not to eat sodium, elev legs, wear support hose; Labs 3/15 showed Cr=1.5 & she is asked to decr the Lasix to 60mQam...    Impaired Gluc Tolerance, Metabolic syndrome> she refused meds; prev eval by DrEllison w/ rec for Metformin Rx but she rejects the DM diagnosis & refused meds; states "I'm eating healthier" (advised low carb low fat); Labs 3/15 showed BS=95, A1c=6.8..Marland KitchenMarland Kitchen   ?Hypothyroid> not currently on meds but she really wants thyroid Rx; prev TFTs all wnl; she prev had Endocrine in [place] prescribe Synthroid for her but she stopped going; clinically & biochemically euthyroid...    Morbid Obesity> peak weight ~340# before lap band surg 2009 by DrMMartin; lost to ~250# but then back up to 280-90#; refused f/u w/ [facility] due to cost of visits & lap-band adjustments; 3/15 weight down to 270# & we reviewed diet & exercise...    GI- GERD, IBS> she uses OTC PPI as needed; last colonoscopy was 1982 by DrPatterson & wnl, she refuses GI referral & refuses f/u colon etc...    GYN- s/p uterine cancer> [facility]    DJD, ?FM, VitD defic> prev on Vicodin, Tramadol, Aleve, VitD; prev eval by DrDeveshwar for rheum; now she states that [name] ("horse linament") applied topically has worked like a miracle & she has increased exercise etc...    Anxiety/ Depression> on Alprazolam 0.564mprn; prev eval by Psychiatry w/ seasonal affective disorder; still sees Psychology [name] re: eating disorder; under considerable stress due to relationship w/ sister, financial pressure, etc... We reviewed prob list, meds, xrays and labs> see below for updates >> she refused the 2014 Flu vaccine... She has declined TDAP & Pneumovax as well...  CXR 3/15 showed underlying COPD, atelectasis in RML, DJD in TSpine, NAD; Rec to take OTC Mucinex600-2Bid w/ fluids...  LABS 3/15:  Chems- BS=95 A1c=6.8 (needs low carb diet, exercise, wt loss) & Creat=1.5 (mild renal insuffic  due to Lasix rx; Rec to decrease Lasix40 to 1 tab Qam...  ~  November 07, 2013:  67m50moV & [name] reports "I'm doing great, just a little arthritis in my mouse hand"; she also relates a lot of grief over the loss of her 9y/o Min-Pin dog Brixx who ran away several mo ago;  She reports that she participated in a medical study out of [place] regarding spacers for folks w/ COPD using MDIs, no ch in her meds required;  She remains on ADVAIR500Bid, SPIRIVA daily, & ProairHFA prn (uses 3-4 x daily "It really helps");  She has Home O2 but dislikes the Liq O2 therefore not using & she wants a portable O2 concentrator to read 4L/min w/ exercise, 2L/min at rest;  She still doesn't like the nasal cannula, doesn't feel that it gets into her system but like the face mask attachment because she can feel it [name]- of course we discussed all these issues (again)...     She has continued to research her supplements and herbal meds>  She tells me that she is living on smoothies she makes out of juice, ice, baking soda ("only a tiny bit"), pomegranate syrup, & local honey;  Tumeric helps the arthritis in her knees and she has decreased the amt of aleve she was using;  She notes that pinching her upper lip in the middle under her nose helps relieve musc cramps in her legs;  She states that she hasn't had any palpit or skip beats since starting on a  Magnesium supplement...    She still sees [name] for help w/ her eating disorder but was a bit upset that he didn't have a diet handout for her;  We gave her the papers we have here to go by... We reviewed prob list, meds, xrays and labs> see below for updates >> she declines the 2015 Flu vaccine today...  Ambulatory O2 Test 9/15>  O2 sat on RA at rest= 92% w/ pulse=86;  Nadir O2sat on RA after 2 laps= 84% w/ pulse=96...   ~  November 17, 2013:  Rebecca [name] presents for an urgent add-on appt (refused to go to the ER) w/ acute SOB; she called yest c/o chest tightness, cough, sm amt of whitish  sput & wanted something to "dry me up" (given antihist)- she refused antibiotics and Prednisone;   drove herself to the office where she was found to be tachypneic w/ RR=24, hypoxemic on RA w/ O2sat=83%; she denies f/c/s, discolored sput, CP; chest exam w/ decr BS, exp wheezing & using accessory muscles; dry cough, no rales or consolidation...    RX> placed on O2 via mask w/ improvement in sats to 93%; given NEB treatment w/ Xopenex 0.63 x2; given Depo120 as well => improved w/ [name] air movement, decr wheezing, and O2sat up to 97%    REC> to change Advair500 to NEBS w/ ALBUT2.31m Qid (?Xopenex not covered)/ BUDES0.520mBid; Pred 2070m4d tapering sched, Mucinex 1200m28md & fluids... Continue Spiriva daily and home Oxygen... We reviewed prob list, meds, xrays and labs> see below for updates >> reminded of low carb, no sweets, get wt down...  CXR today showed COPD/emphysema, no acute abnormality...  LABS 9/15:  Chems- ok w/ BS=119, A1c=6.7, Cr=1.4;  CBC- ok...           Problem List:    OBSTRUCTIVE SLEEP APNEA (ICD-327.23) - sleep study 2005 showed RDI=15 w/ desat to 78%, loud snoring & +leg jerks> on CPAP 10, prev used intermittently but now not at all... ~  11/12:  She has agreed to a new Sleep Study- split night protocol, to recheck her OSA & determine CPAP needs (check ABG if poss too)==> she never followed thru w/ the study. ~  Pt states that she is resting satis and wakes refreshed, energy [name]...  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 supposed to be on NEBULIZER Qid, ADVAIR 500Bid + SPIRIVA daily (compliance is a serious issue- she freq stops all meds) ==> [facility]... ~  A1AT level is normal 218 (83-200) 3/09... ~  baseline CXR w/ borderline Cor, COPD, interstitial scarring/ atelectasis/ obesity... ~  PFTs 3/07 showed FVC=2.74 (82%), FEV1=1.67 (62%), %1sec=61, mid-flows=27%pred... improved from  2005. ~  CTChest in 2007 & 4/09 showed severe centrilob emyphysema, + interstitial fibrosis, sm HH, left renal cyst... neg for PE... ~  2010:  breathing improved w/ weight reduction after Lap-band surg... ~  2/11: COPD exac w/ neg CXR (chr changes, NAD), Rx w/ Depo/ Pred/ Avelox/ Mucinex/ NEBS/ Advair/ Spiriva/ etc... ~  PFTs 3/11 showed FVC= 2.57 (73%), FEV1= 1.51 (55%), %1sec=59, mid-flows= 21%pred. ~  Noncompliant w/ Rx & O2 thru 2012... On disability since 2011 & finally got Medicare coverage 9/12... ~  11/12: presents w/ worsening breathing & dyspnea w/ ADLs ==> we outlined further eval w/ 2DEcho & Sleep Study; plus Rx w/ NEBS QID, Advair500Bid, Spiriva daily, Mucinex, Oxygen, Lasix, Zoloft, Xanax, etc (she declined to proceed w/  the sleep study, 2DEcho, etc)... 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  ~  4/13: she is c/o the nasal O2 not helping since it's hard to breathe thru nose & wants ENT referral==> saw [name] who wanted to do surg but she has severe COPD & hi risk; asked to start PulmRehab & she agrees... ~  10/13: she did PulmRehab for 75mo6-7/13 but quit since it was "fruitless"- barely got to do any exercise since it was so difficult getting to the facility,etc; still c/o nasal problems limiting her benefit from O2 & we discussed the poss of ?transtracheal oxygen? ~  9/14: exsmoker quit 1993; supposed to be on HomeO2- 2L rest & 4L exercise, NEBS w/ Albut, Advair500Bid, Spiriva daily, Mucinex1-2Bid but not taking anything regularly; states that Flutter & Mucinex cause "spasms" in her back; states O2 doesn't help since she can't breathe thru her nose (she has been eval by ENT, CErnesto Rutherford; [facility] "fruitless"; states she "can't breathe at all, can't do anything" etc; SOB w/ ADLs=> O2 recert today (see below)... Note> [facility] and last PFT  (2011) showed FEV1= 1.5 (55%) & %1sec=59... OXYGEN RE-CERT done today... ~  3/15: see above- pt states that a magnesium supplement along w/ herbal supplements "clear lungs" and "lung tonic" have made all the difference & she no longer needs oxygen, exercising regularly & much improved; still taking Advair500, Spiriva, AlbutHFA prn...  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 ~  11/07/13: Ambulatory O2 Test 9/15> O2 sat on RA at rest= 92% w/ pulse=86;  Nadir O2sat on RA after 2 laps= 84% w/ pulse=96...  ~  11/17/13: she presented w/ acute SOB, wheezing, COPD exac> treated w/ NEBS, Depo120, Pred taper, Mucinex1200Bid, fluids, & continue the spiriva & Oxygen; ch her Advair to NEBS w/ Xopenex & Budesonide regularly...   ATYPICAL CHEST PAIN (ICD-786.50) - eval by DrWall in 2009. ~  baseline EKG w/ NSR, PVC's, NSSTTWA, NAD... ~  2DEcho 2/05 showed mild MR/ TR, norm LA/ RV, EF=50-55%... ~  NuclearStressTest 4/09 was norm- no ischemia, EF=55%... [facility]... ~  3/15: she notes some atypCP & requests a cardiac referral for eval- prev seen by DrWall, we will call for Cards consult per her request... ~  EKG 3/15 showed NSR, rate91, occas PVCs, otherw wnl... ~  4/15: she had Cards eval DrNishan> he did not feel that invasive testing was warranted; she had 2DEcho but refused Myoview due to co-pay costs...  ~  2DEcho 4/15 showed norm LV sys function w/ EF=60-65%, normal wall motion, Gr1DD, normal valves, normal RV & PA pressures...  VENOUS INSUFFICIENCY, CHRONIC (ICD-459.81) - Hx chr ven insuffic w/ edema... on LASIX 4062m1-2 daily (she self medicates)... ~  Labs 3/15 showed Cr=1.5 and she is asked to decr the Lasix to 72m72mm...  IMPAIRED GLUCOSE TOLERANCE >>  METABOLIC SYNDROME X (ICD-PHX-505.6she is on diet alone (refused Metformin therapy)... ~  labs 9/08 showed BS=120... FBS 5/08 was 93,  HgA1c=7.1 ~  labs 5/10 showed BS= 182, A1c= 6.4 ~  labs 2/11 showed BS= 76, A1c= 6.5 ~  Labs 5/12 showed BS= 103, A1c= 6.5 ~  She saw DrEllison 4/13 w/ A1c=7.1 but she rejects the diagnosis of Diabetes &  refused Metform562m/d... ~  10/13:  We discussed IMPAIRED GLUCOSE TOLERANCE & need for low carb wt reducing diet and incr exercise... ~  9/14: he refused meds; prev eval by DrEllison w/ rec for Metformin Rx but she rejects the DM diagnosis & refused meds; states "I'm eating healthier" (advised low carb low fat), not checking sugars, prev labs w/ A1c=7.1  ~  3/15: on diet alone; Labs 3/15 showed BS= 95, A1c= 6.8 ~  9/15: on diet alone; Labs 9/15 showed BS= 119, A1c= 6.7  MORBID OBESITY (ICD-278.01) - she prev saw [name] for counselling about her eating disorder... ~  max weight ~340# before lap band surg 7/09 by DrMartin. ~  weight 12/09 = 291# ~  weight 5/10 = 265# ~  weight 11/10- = 254# ~  weight 2/11 = 260# ~  Weight 5/12= 290# ~  Weight 11/12 = 286# ~  Weight 4/13 = 280# ==> BMI=45 ~  Weight 10/13 = 289# ~  Weight 9/14 = 281# ~  Weight 3/15 = 270# ~  Weight 9/15 = 267#  ? Hx HYPOTHYROIDISM (ICD-244.9) - off thyroid meds since 1/10... she was started on Synthroid and followed by DrRSmith in [place] & last seen 7/09- note reviewed... she refuses f/u by [name]- we will recheck TSH off Synthroid Rx... ~  labs 5/10 off Synthroid since 1/10 showed TSH= 1.13 ~  labs 2/11 showed TSH= 1.82 ~  Labs 5/12 showed TSH= 1.81 ~  2013: She wanted to restart Thyroid hormone but TSH remains normal> referred to Endocrine for further eval but she was upset w/ DrEllison's eval; she will decide if she wants to pursue another opinion... ~  9/14: not currently on meds but she really wants thyroid Rx; prev TFTs all wnl; she prev had Endocrine in [place] prescribe Synthroid for her but she stopped going; clinically & biochemically euthyroid...  ~  3/15: on Synthroid10-12 tab daily; Labs showed TSH=  3.00  GERD (ICD-530.81) - prev on Nexium but off all meds since 1/10... IRRITABLE BOWEL SYNDROME (ICD-564.1) - colonoscopy 1982 by DrPatterson was WNL... She has refused f/u GI eval & f/u colonoscopy...  ?KIDNEY STONE Hx >> pt said she passed a kidney stone 3/11 w/o any medical attention, w/o work up or proof of same; entry made here for future reference if needed.  UTERINE CANCER, HX OF (ICD-V10.42) - s/p laparoscopic hyste[facility] & [facility]..   DEGENERATIVE JOINT DISEASE (ICD-715.90) - on VICODIN tid prn,  ANAPROX 2268mBid prn, ULTRAM 5067mrn... ? 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 she was on Wellbutrin & Vicodin when last seen in 2006... VITAMIN D DEFICIENCY (ICD-268.9) - Vit D level 5/10 = 16... rec> start OTC Vit D 2000 u daily. ~  9/14: on Vicodin, Tramadol, Aleve, VitD, etc; prev eval by DrDeveshwar for rheum; now c/o pain in left knee (she thinks it's "siatica"), can't exercise & wants to [facility]- ok... ~  3/15: she states that topical [name] ("horse linament") rubbed into knees really helps but she wants to keep the Vicodinon hand just in case...  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 she [place]/ [place]... prev seeing psychologist [name] for eating disorder counselling... ~  4/13: she mentioned seeing [name] again for eating disorder &  counseling for depression etc... ~  9/14: on Alprazolam 0.6m tid prn; prev eval by Psychiatry w/ seasonal affective disorder; still sees Psychology [name] re: eating disorder; under considerable stress due to relationship w/ sister, financial pressure, etc...  ~  3/15: she has the Alpraz0.5108mfor prn use but seldom uses it; still sees [name] on occas but doesn't think she  needs to continue...  HEALTH MAINTENANCE:   ~  GI:  Per seen by DrPatterson but last colon was 1982 & she refuses follow up... ~  GYN:  [facility]  Immuniz:  She had Pneumovax in the past; she declines Flu shots and Tetanus shots...   Past Surgical History  Procedure Laterality Date  . 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 Appendectomy    . Cholecystectomy  1982  . Laparoscopic hysterectomy  2007    forsythe  . Bilateral salpingoophorectomy  2007    forsythe  . Laparoscopic gastric banding  08/2007    Dr. MaHassell Done  Outpatient Encounter Prescriptions as of 11/17/2013  Medication Sig  . albuterol (PROAIR HFA) 108 (90 BASE) MCG/ACT inhaler Inhale 2 puffs into the lungs every 6 (six) hours as needed.  . ALPRAZolam (XANAX) 0.5 MG tablet TAKE 1/2 TO 1 TABLET BY MOUTH 3 TIMES DAILY AS NEEDED FOR NERVES. NOT TO EXCEED 3 PER DAY  . B Complex-C-Folic Acid (B-COMPLEX BALANCED) TABS Take 1 tablet by mouth daily.  . Raelyn Ensignollen 1000 MG TABS Take 2 tablets by mouth daily.  . cetirizine (ZYRTEC) 10 MG tablet Take 10 mg by mouth daily.    . Cholecalciferol (VITAMIN D-3) 5000 UNITS TABS Take 1 tablet by mouth daily.  . Coconut Oil 1000 MG CAPS Take 4 capsules by mouth daily.  . Coenzyme Q10 (COQ10) 100 MG CAPS Take 1 tablet by mouth daily.  . Fluticasone-Salmeterol (ADVAIR DISKUS) 500-50 MCG/DOSE AEPB Inhale 1 puff into the lungs 2 (two) times daily.  . furosemide (LASIX) 40 MG tablet TAKE 1 TO 2 TABS BY MOUTH ONCE DAILY AS NEEDED FOR SWELLING  . Krill Oil 500 MG CAPS Take 1 capsule by mouth daily.  . Magnesium 400 MG CAPS Take 2 capsules by mouth daily.  . naproxen sodium (ANAPROX) 220 MG tablet Take 2 tabs by mouth once daily   . SPIRIVA HANDIHALER 18 MCG inhalation capsule PLACE 1 CAPSULE (18 MCG TOTAL) INTO INHALER AND INHALE DAILY.  . traMADol (ULTRAM) 50 MG tablet TAKE 1 TABLET BY MOUTH  3 TIMES DAILY   AS NEEDED FOR PAIN  . vitamin C (ASCORBIC  ACID) 250 MG tablet Take 250 mg by mouth daily.    Allergies  Allergen Reactions  . Penicillins     REACTION: nausea and vomiting    Current Medications, Allergies, Past Medical History, Past Surgical History, Family History, and Social History were reviewed in [employer].   Review of Systems         See HPI - all other systems neg except as noted... The patient complains of dyspnea on exertion and difficulty walking; chest discomfort, & leg weakness.  The patient denies anorexia, fever, weight loss, weight gain, vision loss, decreased hearing, hoarseness, syncope, prolonged cough, headaches, hemoptysis, abdominal pain, melena, hematochezia, severe indigestion/heartburn, hematuria, incontinence, suspicious skin lesions, transient blindness, depression, unusual weight change, abnormal bleeding, enlarged lymph nodes, and angioedema.    Objective:   Physical Exam  WD, Morbidly Obese, 63 y/o WF in acute distress fro dyspnea/ wheezing... GENERAL:  Alert & oriented; pleasant & cooperative... HEENT:  [place]/AT, EOM-wnl, PERRLA, EACs-clear, TMs-wnl, NOSE-clear, THROAT-clear & wnl. NECK:  Supple w/ fairROM; no JVD; normal carotid impulses w/o bruits; no thyromegaly or nodules palpated; no lymphadenopathy. CHEST:  decr BS bilat; +accessory musc use, decr BS bilat, +exp wheezing... HEART:  Regular Rhythm; without murmurs/ rubs/ or gallops heard... ABDOMEN:  Obese, soft & nontender; normal bowel sounds; no organomegaly or masses detected. EXT: without deformities, mod arthritic changes; no varicose veins/ +venous insuffic/trace edema. NEURO:  CNs intact; no focal neuro deficits... DERM: few ecchymoses, no rash etc...  RADIOLOGY DATA:  Reviewed in the EPIC EMR & discussed w/ the patient...  LABORATORY DATA:  Reviewed in the EPIC EMR & discussed w/ the patient...   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 3/15> she now feels she's been cured by a Magnesium supplement & herbal preps- "Clear lungs, and Lung tonic"... She declined to recheck O2 sats w/ ambulation in the office...  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 11/17/13> presents w/ acute exac and ac on chr resp insuffic; she refused to go to the ER; given Xopenex0.63 x2, Depo120 & some time; she calmed down & improved; Rx NEBS w/ Xopenex vs Albut, Budes Bid, Prednisone taper over the next 2weeks, max Mucinex.  OSA>  As noted she needs new sleep study & appropriate new CPAP apparatus to facilitate compliance but she declines the split night study...  COPD/ Emphysema>  Encouraged to stay on her O2 regularly, continue NEBS, Advair/ Spiriva regularly & use the Mucinex; needs to lose wt & incr exercise...  Hx CP w/ neg cardiac evals including several Myoviews; we wanted to check 2DEcho to screen for [facility] but she declined... 3/15> she requests Cardiology consult for eval of AtypCP...  OBESITY>  She had remote lap band surg by DrMMartin; Met syndrome, must get wt down etc; she is a lap band failure & prev counseling by [name]...  ? Hx Hypothy>  TFTs remain normal off meds- no problem...  GI>   GERD> she denies symptoms & uses OTC meds prn...  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  Dysthymia>  Much stress in her life, on Alpraz prn; has embraced alt lifestyle...   Patient's Medications  New Prescriptions   ALBUTEROL (PROVENTIL) (2.5 MG/3ML) 0.083% NEBULIZER SOLUTION    Take 3 mLs (2.5 mg total) by nebulization 4 (four) times daily.   BUDESONIDE (PULMICORT) 0.5 MG/2ML NEBULIZER SOLUTION    Take 2 mLs (0.5 mg total) by nebulization 2 (two) times  daily.   PREDNISONE (DELTASONE) 20 MG TABLET    Take 1 tablet two times daily x 4 days, 1 tablet daily x 4 days, 1/2 tablet daily x 4 days, 1/2 tablet every other day until gone  Previous Medications   ALBUTEROL (PROAIR HFA) 108 (90 BASE) MCG/ACT INHALER    Inhale 2 puffs into the lungs every 6 (six) hours as needed.   ALPRAZOLAM (XANAX) 0.5 MG TABLET    TAKE 1/2 TO 1 TABLET BY MOUTH 3 TIMES DAILY AS NEEDED FOR NERVES. NOT TO EXCEED 3 PER DAY   B COMPLEX-C-FOLIC ACID (B-COMPLEX BALANCED) TABS    Take 1 tablet by mouth daily.   BEE POLLEN 1000 MG TABS    Take 2 tablets by mouth daily.   CETIRIZINE (ZYRTEC) 10 MG TABLET    Take 10 mg by mouth daily.     CHOLECALCIFEROL (VITAMIN D-3) 5000 UNITS TABS    Take 1 tablet by mouth daily.   COCONUT OIL 1000 MG CAPS    Take 4 capsules by mouth daily.   COENZYME Q10 (COQ10) 100 MG CAPS    Take 1 tablet by mouth daily.   FLUTICASONE-SALMETEROL (ADVAIR DISKUS) 500-50 MCG/DOSE AEPB    Inhale 1 puff into the lungs 2 (two) times daily.   FUROSEMIDE (LASIX) 40 MG TABLET    TAKE 1 TO 2 TABS BY MOUTH ONCE DAILY AS NEEDED FOR SWELLING   KRILL OIL 500 MG CAPS    Take 1 capsule by mouth daily.   MAGNESIUM 400 MG CAPS    Take 2 capsules by mouth daily.   NAPROXEN SODIUM (ANAPROX) 220 MG TABLET    Take 2 tabs by mouth once daily    SPIRIVA HANDIHALER 18 MCG INHALATION CAPSULE    PLACE 1 CAPSULE (18 MCG TOTAL) INTO INHALER AND INHALE DAILY.   TRAMADOL (ULTRAM) 50 MG TABLET     TAKE 1 TABLET BY MOUTH  3 TIMES DAILY   AS NEEDED FOR PAIN   VITAMIN C (ASCORBIC ACID) 250 MG TABLET    Take 250 mg by mouth daily.  Modified Medications   No medications on file  Discontinued Medications   No medications on file

## 2013-11-17 NOTE — Telephone Encounter (Signed)
Spoke with the pt  She states that she "can not breathe" needs to be seen "today" She was up all night coughing and having SOB  She is using albuterol HFA 6 x per day and neb 2 x per day  I spoke with SN who advised that if she is this acutely ill she would be best served in the ED but if she insists on being seen here we can work her in at 23 today  She states "if he does not want to see me anymore, he needs to turn me over to someone else"  I advised that he has no problem with seeing her, but with the symptoms she is having it is his best advise to go to ED  She refuses this and demands to come in today  OV at 11 am

## 2013-11-17 NOTE — Patient Instructions (Addendum)
Today we updated your med list in our EPIC system...    Continue your current medications the same...  Today we checked a follow up CXR & blood work...    We will contact you w/ the results when available...   We decided to change your Advair inhaler to NEBULIZER treatments>>    Use the Albuterol 2.5mg /73ml 4 times daily...    Use the Budesonide 0.5mg /20ml twice daily every day...  We gave you a Depo shot 7 a new prescription for PREDNISONE to take as follows>    Start w/ one tab twice daily for 4 days,     Then take one tab each AM for 4 days,     Then take 1/2 tab each AM for 4 days,    Then take 1/2 tab every other day until gone...  You also need the GUAIFENESIN (Mucinex) 1200mg  twice daily & lots of fluids like you are doing...  Continue your honme oxygen...  Let's plan a follow up recheck in 3 weeks.Marland KitchenMarland Kitchen

## 2013-11-17 NOTE — Telephone Encounter (Addendum)
rxs for the nebs that SN wanted the pt to have were faxed

## 2013-11-22 ENCOUNTER — Telehealth: Payer: Self-pay | Admitting: Pulmonary Disease

## 2013-11-22 NOTE — Telephone Encounter (Signed)
Per SN; Pt needs to take her alprazolam TID to help relax that chest wall muscle and her throat so she can breathe better. No else SN can do, she is on the max medication. If she wants then we can refer her to medical center   Called pt. Made her aware of SN recs. She will try to take the alprazolam TID. She does not referral right now. Nothing further needed

## 2013-11-22 NOTE — Telephone Encounter (Signed)
Per 11/17/13 OV; Patient Instructions     Today we updated your med list in our EPIC system...  Continue your current medications the same...  Today we checked a follow up CXR & blood work...  We will contact you w/ the results when available...  We decided to change your Advair inhaler to NEBULIZER treatments>>  Use the Albuterol 2.5mg /7ml 4 times daily...  Use the Budesonide 0.5mg /83ml twice daily every day...  We gave you a Depo shot 7 a new prescription for PREDNISONE to take as follows>  Start w/ one tab twice daily for 4 days,  Then take one tab each AM for 4 days,  Then take 1/2 tab each AM for 4 days,  Then take 1/2 tab every other day until gone...  You also need the GUAIFENESIN (Mucinex) 1200mg  twice daily & lots of fluids like you are doing...  Continue your honme oxygen...  Let's plan a follow up recheck in 3 weeks...    Called spoke with pt. She reports she is still feeling SOB, coughing up large amounts of clear phlem, chest congestion, throat feels like mucus is stuck. Pt reports she feels like she is suffocating. Pt increased her O2 on her concentrating to 6 liters last night. She reports she has tried sleeping with her head elevated, sleeping in a chair and nothing seems to help. Please advise SN thanks  Allergies  Allergen Reactions  . Penicillins     REACTION: nausea and vomiting     Current Outpatient Prescriptions on File Prior to Visit  Medication Sig Dispense Refill  . albuterol (PROAIR HFA) 108 (90 BASE) MCG/ACT inhaler Inhale 2 puffs into the lungs every 6 (six) hours as needed.  1 Inhaler  6  . ALPRAZolam (XANAX) 0.5 MG tablet TAKE 1/2 TO 1 TABLET BY MOUTH 3 TIMES DAILY AS NEEDED FOR NERVES. NOT TO EXCEED 3 PER DAY  90 tablet  5  . B Complex-C-Folic Acid (B-COMPLEX BALANCED) TABS Take 1 tablet by mouth daily.      Raelyn Ensign Pollen 1000 MG TABS Take 2 tablets by mouth daily.      . budesonide (PULMICORT) 0.5 MG/2ML nebulizer solution Take 2 mLs (0.5 mg total) by  nebulization 2 (two) times daily.  120 mL  12  . cetirizine (ZYRTEC) 10 MG tablet Take 10 mg by mouth daily.        . Cholecalciferol (VITAMIN D-3) 5000 UNITS TABS Take 1 tablet by mouth daily.      . Coconut Oil 1000 MG CAPS Take 4 capsules by mouth daily.      . Coenzyme Q10 (COQ10) 100 MG CAPS Take 1 tablet by mouth daily.      . Fluticasone-Salmeterol (ADVAIR DISKUS) 500-50 MCG/DOSE AEPB Inhale 1 puff into the lungs 2 (two) times daily.  180 each  3  . furosemide (LASIX) 40 MG tablet TAKE 1 TO 2 TABS BY MOUTH ONCE DAILY AS NEEDED FOR SWELLING  60 tablet  4  . Krill Oil 500 MG CAPS Take 1 capsule by mouth daily.      Marland Kitchen levalbuterol (XOPENEX) 1.25 MG/3ML nebulizer solution Take 1.25 mg by nebulization 2 (two) times daily.  180 mL  6  . Magnesium 400 MG CAPS Take 2 capsules by mouth daily.      . naproxen sodium (ANAPROX) 220 MG tablet Take 2 tabs by mouth once daily       . predniSONE (DELTASONE) 20 MG tablet Take 1 tablet two times daily x 4  days, 1 tablet daily x 4 days, 1/2 tablet daily x 4 days, 1/2 tablet every other day until gone  20 tablet  0  . SPIRIVA HANDIHALER 18 MCG inhalation capsule PLACE 1 CAPSULE (18 MCG TOTAL) INTO INHALER AND INHALE DAILY.  30 capsule  5  . traMADol (ULTRAM) 50 MG tablet TAKE 1 TABLET BY MOUTH  3 TIMES DAILY   AS NEEDED FOR PAIN  90 tablet  1  . vitamin C (ASCORBIC ACID) 250 MG tablet Take 250 mg by mouth daily.       No current facility-administered medications on file prior to visit.

## 2013-11-23 ENCOUNTER — Telehealth: Payer: Self-pay | Admitting: Pulmonary Disease

## 2013-11-23 NOTE — Telephone Encounter (Signed)
FYI to SN

## 2013-12-15 ENCOUNTER — Encounter: Payer: Self-pay | Admitting: Pulmonary Disease

## 2013-12-15 ENCOUNTER — Encounter: Payer: Self-pay | Admitting: *Deleted

## 2013-12-15 ENCOUNTER — Ambulatory Visit (INDEPENDENT_AMBULATORY_CARE_PROVIDER_SITE_OTHER): Payer: Medicare Other | Admitting: Pulmonary Disease

## 2013-12-15 VITALS — BP 140/88 | HR 88 | Temp 98.4°F | Ht 66.0 in

## 2013-12-15 DIAGNOSIS — F341 Dysthymic disorder: Secondary | ICD-10-CM

## 2013-12-15 DIAGNOSIS — J449 Chronic obstructive pulmonary disease, unspecified: Secondary | ICD-10-CM

## 2013-12-15 DIAGNOSIS — R7302 Impaired glucose tolerance (oral): Secondary | ICD-10-CM

## 2013-12-15 DIAGNOSIS — E039 Hypothyroidism, unspecified: Secondary | ICD-10-CM | POA: Diagnosis not present

## 2013-12-15 DIAGNOSIS — K219 Gastro-esophageal reflux disease without esophagitis: Secondary | ICD-10-CM

## 2013-12-15 DIAGNOSIS — E8881 Metabolic syndrome: Secondary | ICD-10-CM

## 2013-12-15 DIAGNOSIS — M159 Polyosteoarthritis, unspecified: Secondary | ICD-10-CM

## 2013-12-15 DIAGNOSIS — M150 Primary generalized (osteo)arthritis: Secondary | ICD-10-CM

## 2013-12-15 DIAGNOSIS — G4733 Obstructive sleep apnea (adult) (pediatric): Secondary | ICD-10-CM

## 2013-12-15 NOTE — Progress Notes (Signed)
HPI  Review of Systems  Physical Exam  Patient ID: DENORA WYSOCKI, female   DOB: 1950/09/02, 63 y.o.   MRN: [id number]  Subjective:    Patient ID: [name], female    DOB: Aug 03, 1950, 63 y.o.   MRN: [id number]  HPI 63 y/o WF known to me w/ mult med problems as noted below... SEE PREV EPIC NOTES FOR OLDER DATA >>   ~  December 08, 2011:  1moROV & [name] saw DrEllison for Endocrine 4/13 but she is quite upset as he never addressed her Thyroid, just told her she had DM (A1c was 7.1) & prescribed Metformin5073md which she refused to fill & will not follow up w/ him; I offered another Endocrine eval, but she is thinking she might just ret to [name] in [place], she will let me know... We discussed "abnormal glucose tolerance" as a pre-diabetic diagnosis & the need for low carb wt reducing diet ...    She has had a severe emotional upset over the last 33m32moer sister JanOpal Sidless been trying to get her evicted from her home (deceased mother's house that she gets to stay in during her life per Trust arrangement); [name] has been helping her w/ eating disorder & anxiety/depression & she tells me that she doesn't need meds- her alternative lifestyle functions to release endorphins that in turn stimulate her endocrine system to prevent depression she says...    Recall that [name] wanted to operate on her nose/sinuses/upper airway but we encouraged caution due to her severe airway dis/ COPD; [facility] but found this "a waste of time & money" as it was 33m33m,/ $60/wk & she barely got to do any exercise on the machines; it was a greater effort to get to the rehab dept than to exercise when there, and she felt that she did more exercise at home doing her cleaning, cooking, laundry, bathing, caring for dogs, etc; she even painted a room at home (but has someone do her yard work); she feels the prob is that the nasal cannula doesn't help due to nasal obstruction & putting cannula in mouth is  only a temp fix; we discussed the poss to transtracheal catheter & she is interested in pursuing this approach...     She further notes that she drinks a liver detoxer and 1 gal of home carbonated water per day "I belch & it makes room for me to eat"; weight is up 9# to 289# and we reviewed low carb, low fat, wt reducing diet...    We reviewed prob list, meds, xrays and labs> see below for updates >> she declines flu vaccine...  ~  November 04, 2012:  65mo89mo& [name] has lost 8# down to 281# today but she continues w/ numerous complaints, nothing is better she says, see below; We reviewed the following medical problems during today's office visit >>     Hx mild OSA w/ mod desat, loud snoring, & leg jerks (on sleep study 2005)> she tried CPAP10 but stopped on her own & declined to repeat split night study & re-try CPAP rx...    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 1993; supposed to be on HomeO2- 2L rest & 4L exercise, NEBS w/ Albut, Advair500Bid, Spiriva daily, Mucinex1-2Bid but not taking anything regularly; states that Flutter & Mucinex cause "spasms" in her back; states O2 doesn't help since she can't breathe thru her nose (she has been eval by ENT, Cross[facility]fordnally [facility] after  23moas "fruitless"; states she "can't breathe at all, can't do anything" etc; SOB w/ ADLs=> O2 recert today (see below)... Note> [facility] and last PFT (2011) showed FEV1= 1.5 (55%) & %1sec=59...    Hx AtypCP> baseline EKG w/ NSSTTWA; prev 2DEcho w/ norm LVF, EF=50-55%, mild MR/TR, norm LA/RV; Myoview 2009 normal w/o ischemia & EF=55%...    Ven Insuffic> on Lasix40-2Qam; she knows not to eat sodium, elev legs, wear support hose...    Impaired Gluc Tolerance, Metabolic syndrome> she refused meds; prev eval by DrEllison w/ rec for Metformin Rx but she rejects the DM diagnosis & refused meds; states "I'm eating healthier" (advised low carb low fat), not checking sugars, prev labs w/  A1c=7.1    ?Hypothyroid> not currently on meds but she really wants thyroid Rx; prev TFTs all wnl; she prev had Endocrine in [place] prescribe Synthroid for her but she stopped going; clinically & biochemically euthyroid...    Morbid Obesity> peak weight ~340# before lap band surg 2009 by DrMMartin; lost to ~250# but then back up to 280-90#; refused f/u w/ [facility] due to cost of visits & lap-band adjustments; we reviewed diet/ exercise...    GI- GERD, IBS> she uses OTC PPI as needed; last colonoscopy was 1982 by DrPatterson & wnl, she refuses GI referral & refuses f/u colon etc...    GYN- s/p uterine cancer> s/p [facility] hyst & [facility]    DJD, ?FM, VitD defic> on Vicodin, Tramadol, Aleve, VitD, etc; prev eval by DrDeveshwar for rheum; now c/o pain in left knee (she thinks it's "siatica"), can't exercise & wants to see [facility]- ok...    Anxiety/ Depression> on Alprazolam 0.5166mtid prn; prev eval by Psychiatry w/ seasonal affective disorder; still sees Psychology [name] re: eating disorder; under considerable stress due to relationship w/ sister, financial pressure, etc... We reviewed prob list, meds, xrays and labs> see below for updates >> she take numerous herbs, supplements, etc- often rec to her by her alternative lifestyle acquaintances...      OXYGEN RE-CERTIFICATION:   9/06/05/48  1.  The pt has a continued need for home oxygen therapy due to her severe COPD/ emphysema...    2.  She is using the oxygen in the home w/ exercise (4L/min), and at night (2L/min)...    3.  O2 sats at rest on RA today=> 92% w/ pulse rate 82/min...     4.  Pt exercised in office on RA => after 2laps O2 sat nadir= 86% w/ pulse rate=105...    5.  Pt placed on O2 at 4L/min via [place] & exercise repeated => after 2laps O2 sat nadir= 92% w/ pulse rate=102  ~  May 05, 2013:  66m53moV & when GilArtis Delays seen last (9/14) she was placed on O2 but she was miserable- "couldn't do anything"; she tells me she started doing  her own research & found out that Magnesium is very important (helps her leg cramps) and you can still be low w/ normal blood levels in fact everybody is deficient so she started taking a special easily absorbed magnesium supplement and within 4H of the 1st dose her breathing was 100% better & she feels that her breathing is cured, hasn't needed any oxygen (but wants to keep it just in case), and now she's walking, exercising, & she's lost 10#...  She also wanted to let me know about the benefits to [name] (horse linament)  that she rubs into her knees and it works great, it's a great anti-inflamm & analgesic, doesn't need any Hydrocodone etc... Finally she wanted to let me know about 2 supplements that have been very helpful- "Clear Lungs" and "Lung tonic"...  We reviewed the following medical problems during today's office visit >>     Hx mild OSA w/ mod desat, loud snoring, & leg jerks (on sleep study 2005)> she tried CPAP10 but stopped on her own & declined to repeat split night study & re-try CPAP rx; she says she's sleeping satis & wakes feeling refreshed...    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 1993; supposed to be on HomeO2- 2L rest & 4L exercise, NEBS w/ Albut, Advair500Bid, Spiriva daily, Mucinex1-2Bid but not taking anything regularly; states that Flutter & Mucinex cause "spasms" in her back; states O2 doesn't help since she can't breathe thru her nose (she has been eval by ENT, [name]); finally tried [facility] but stopped after 43moas "fruitless"; prev stated she "can't breathe at all, can't do anything" etc; SOB w/ ADLs etc; Note> [facility] and last PFT (2011) showed FEV1= 1.5 (55%) & %1sec=59 ==> 3/15 she announced that she's been cured by taking a Magnesium supplement, along w/ herbal formula "Clear lungs" and "Lung tonic"; she has stopped her Oxygen, still using Advair500 & Spiriva, using Mucinex 7 AlbutHFA just prn...     Hx AtypCP> baseline EKG w/ NSSTTWA; prev 2DEcho w/  norm LVF, EF=50-55%, mild MR/TR, norm LA/RV; Myoview 2009 normal w/o ischemia & EF=55%; she notes some atypCP & requests f/u cardiac eval (prev seen by DrWall)...    Ven Insuffic> on Lasix40-2Qam; she knows not to eat sodium, elev legs, wear support hose; Labs 3/15 showed Cr=1.5 & she is asked to decr the Lasix to 462mam...    Impaired Gluc Tolerance, Metabolic syndrome> she refused meds; prev eval by DrEllison w/ rec for Metformin Rx but she rejects the DM diagnosis & refused meds; states "I'm eating healthier" (advised low carb low fat); Labs 3/15 showed BS=95, A1c=6.8...Marland KitchenMarland Kitchen  ?Hypothyroid> not currently on meds but she really wants thyroid Rx; prev TFTs all wnl; she prev had Endocrine in [place] prescribe Synthroid for her but she stopped going; clinically & biochemically euthyroid...    Morbid Obesity> peak weight ~340# before lap band surg 2009 by DrMMartin; lost to ~250# but then back up to 280-90#; refused f/u w/ [facility] due to cost of visits & lap-band adjustments; 3/15 weight down to 270# & we reviewed diet & exercise...    GI- GERD, IBS> she uses OTC PPI as needed; last colonoscopy was 1982 by DrPatterson & wnl, she refuses GI referral & refuses f/u colon etc...    GYN- s/p uterine cancer> s[facility].    DJD, ?FM, VitD defic> prev on Vicodin, Tramadol, Aleve, VitD; prev eval by DrDeveshwar for rheum; now she states that [name] ("horse linament") applied topically has worked like a miracle & she has increased exercise etc...    Anxiety/ Depression> on Alprazolam 0.29m52mrn; prev eval by Psychiatry w/ seasonal affective disorder; still sees Psychology [name] re: eating disorder; under considerable stress due to relationship w/ sister, financial pressure, etc... We reviewed prob list, meds, xrays and labs> see below for updates >> she refused the 2014 Flu vaccine... She has declined TDAP & Pneumovax as well...  CXR 3/15 showed underlying COPD, atelectasis in RML, DJD in  TSpine, NAD; Rec  to take OTC Mucinex600-2Bid w/ fluids...  LABS 3/15:  Chems- BS=95 A1c=6.8 (needs low carb diet, exercise, wt loss) & Creat=1.5 (mild renal insuffic due to Lasix rx; Rec to decrease Lasix40 to 1 tab Qam...   ADDENDUM>>  she had Cards eval [name] 4/15> he did not feel that invasive testing was warranted; she had 2DEcho but refused Myoview due to co-pay costs...   2DEcho 4/15 showed norm LV sys function w/ EF=60-65%, normal wall motion, Gr1DD, normal valves, normal RV & PA pressures...  ~  November 07, 2013:  28moROV & [name] reports "I'm doing great, just a little arthritis in my mouse hand"; she also relates a lot of grief over the loss of her 9y/o Min-Pin dog Brixx who ran away several mo ago;  She reports that she participated in a medical study out of [place] regarding spacers for folks w/ COPD using MDIs, no ch in her meds required;  She remains on ADVAIR500Bid, SPIRIVA daily, & ProairHFA prn (uses 3-4 x daily "It really helps");  She has Home O2 but dislikes the Liq O2 therefore not using & she wants a portable O2 concentrator to read 4L/min w/ exercise, 2L/min at rest;  She still doesn't like the nasal cannula, doesn't feel that it gets into her system but like the face mask attachment because she can feel it better- of course we discussed all these issues (again)...     She has continued to research her supplements and herbal meds>  She tells me that she is living on smoothies she makes out of juice, ice, baking soda ("only a tiny bit"), pomegranate syrup, & local honey;  Tumeric helps the arthritis in her knees and she has decreased the amt of aleve she was using;  She notes that pinching her upper lip in the middle under her nose helps relieve musc cramps in her legs;  She states that she hasn't had any palpit or skip beats since starting on a Magnesium supplement...    She still sees [name] for help w/ her eating disorder but was a bit upset that he didn't have a diet handout for  her;  We gave her the papers we have here to go by... We reviewed prob list, meds, xrays and labs> see below for updates >> she declines the 2015 Flu vaccine today...  Ambulatory O2 Test 9/15>  O2 sat on RA at rest= 92% w/ pulse=86;  Nadir O2sat on RA after 2 laps= 84% w/ pulse=96...   ~  November 17, 2013:  GArtis Delaypresents for an urgent add-on appt (refused to go to the ER) w/ acute SOB; she called yest c/o chest tightness, cough, sm amt of whitish sput & wanted something to "dry me up" (given antihist)- she refused antibiotics and Prednisone;   drove herself to the office where she was found to be tachypneic w/ RR=24, hypoxemic on RA w/ O2sat=83%; she denies f/c/s, discolored sput, CP; chest exam w/ decr BS, exp wheezing & using accessory muscles; dry cough, no rales or consolidation...    RX> placed on O2 via mask w/ improvement in sats to 93%; given NEB treatment w/ Xopenex 0.63 x2; given Depo120 as well => improved w/ better air movement, decr wheezing, and O2sat up to 97%    REC> to change Advair500 to NEBS w/ ALBUT2.5656mQid (?Xopenex not covered)/ BUDES0.56m48mid; Pred 70m70md tapering sched, Mucinex 1200mg63m & fluids... Continue Spiriva daily and home Oxygen... We reviewed prob list, meds, xrays and labs>  see below for updates >> reminded of low carb, no sweets, get wt down...  CXR today showed COPD/emphysema, no acute abnormality...  LABS 9/15:  Chems- ok w/ BS=119, A1c=6.7, Cr=1.4;  CBC- ok...  ~  December 15, 2013:  77moROV & GArtis Delayis much improved on the Pred taper=> now down to 1/2 Qod but she wants off, feels worse on the day she takes it she says; advised slow taper to 1/2 Q3d til gone, and reminded to use the Xopenex/Budes NEB Bid regularly, and the Mucinex 12054mBid w/ Fluids...  She tells me that she has had some recent dental problems and inquired about a state sponsored dental clinic option but there were no openings unless her doctor wrote a letter indicating that her dental prob  was contrib to her medical illness etc=> letter written & given to pt per request...  She has been using the Xanax but feels it is too strong for her- even 1/2 tab at bedtime gives her a hangover & she is advised to further decr the dose until she finds the best combination of rest & no morning side effect... Plan ROV 42m53mo         Problem List:    OBSTRUCTIVE SLEEP APNEA (ICD-327.23) - sleep study 2005 showed RDI=15 w/ desat to 78%, loud snoring & +leg jerks> on CPAP 10, prev used intermittently but now not at all... ~  11/12:  She has agreed to a new Sleep Study- split night protocol, to recheck her OSA & determine CPAP needs (check ABG if poss too)==> she never followed thru w/ the study. ~  Pt states that she is resting satis and wakes refreshed, energy better...  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 supposed to be on NEBULIZER Qid, ADVAIR 500Bid + SPIRIVA daily (compliance is a serious issue- she freq stops all meds) ==> [facility]... ~  A1AT level is normal 218 (83-200) 3/09... ~  baseline CXR w/ borderline Cor, COPD, interstitial scarring/ atelectasis/ obesity... ~  PFTs 3/07 showed FVC=2.74 (82%), FEV1=1.67 (62%), %1sec=61, mid-flows=27%pred... improved from 2005. ~  CTChest in 2007 & 4/09 showed severe centrilob emyphysema, + interstitial fibrosis, sm HH, left renal cyst... neg for PE... ~  2010:  breathing improved w/ weight reduction after Lap-band surg... ~  2/11: COPD exac w/ neg CXR (chr changes, NAD), Rx w/ Depo/ Pred/ Avelox/ Mucinex/ NEBS/ Advair/ Spiriva/ etc... ~  PFTs 3/11 showed FVC= 2.57 (73%), FEV1= 1.51 (55%), %1sec=59, mid-flows= 21%pred. ~  Noncompliant w/ Rx & O2 thru 2012... On disability since 2011 & finally got Medicare coverage 9/12... ~  11/12: presents w/ worsening breathing & dyspnea w/ ADLs ==> we outlined further eval w/ 2DEcho, Sleep Study; Rx w/ NEBS QID, Advair500Bid,  Spiriva daily, Mucinex, Oxygen, Lasix, Zoloft, Xanax, etc (she declined to proceed w/ the sleep study, 2DEcho, etc)... 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  ~  4/13: she is c/o the nasal O2 not helping since it's hard to breathe thru nose & wants ENT referral==> saw [name] who wanted to do surg but she has severe COPD & hi risk; asked to start [facility] & she agrees... ~  10/13: she did [facility] for 47mo67mo/13 but quit since it was "fruitless"- barely got to do any exercise since it was so difficult getting to the facility,etc; still c/o  nasal problems limiting her benefit from O2 & we discussed the poss of ?transtracheal oxygen? ~  9/14: exsmoker quit 1993; supposed to be on HomeO2- 2L rest & 4L exercise, NEBS w/ Albut, Advair500Bid, Spiriva daily, Mucinex1-2Bid but not taking anything regularly; states that Flutter & Mucinex cause "spasms" in her back; states O2 doesn't help since she can't breathe thru her nose (she has been eval by ENT, [name]); finally tried [facility] but stopped after 45moas "fruitless"; states she "can't breathe at all, can't do anything" etc; SOB w/ ADLs=> O2 recert today (see below)... Note> [facility] and last PFT (2011) showed FEV1= 1.5 (55%) & %1sec=59... OXYGEN RE-CERT done today... ~  3/15: see above- pt states that a magnesium supplement along w/ herbal supplements "clear lungs" and "lung tonic" have made all the difference & she no longer needs oxygen, exercising regularly & much improved; still taking Advair500, Spiriva, AlbutHFA prn...  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 ~  CXR 9/15 showed COPD/emphysema, no acute abnormality... ~  11/07/13: Ambulatory O2 Test 9/15> O2 sat on RA at rest= 92% w/ pulse=86;  Nadir O2sat on RA after 2 laps= 84% w/ pulse=96...  ~  11/17/13: she  presented w/ acute SOB, wheezing, COPD exac> treated w/ NEBS, Depo120, Pred taper, Mucinex1200Bid, fluids, & continue the Spiriva & Oxygen; ch her Advair to NEBS w/ Xopenex & Budesonide regularly... ~  10/15: she is much improved w/ Pred taper but she wants off; reminded to use NEBS w/ Xopenex & Budes Bid every day...   ATYPICAL CHEST PAIN (ICD-786.50) - eval by DrWall in 2009. ~  baseline EKG w/ NSR, PVC's, NSSTTWA, NAD... ~  2DEcho 2/05 showed mild MR/ TR, norm LA/ RV, EF=50-55%... ~  NuclearStressTest 4/09 was norm- no ischemia, EF=55%... [place]... ~  3/15: she notes some atypCP & requests a cardiac referral for eval- prev seen by DrWall, we will call for Cards consult per her request... ~  EKG 3/15 showed NSR, rate91, occas PVCs, otherw wnl... ~  4/15: she had Cards eval DrNishan> he did not feel that invasive testing was warranted; she had 2DEcho but refused Myoview due to co-pay costs...  ~  2DEcho 4/15 showed norm LV sys function w/ EF=60-65%, normal wall motion, Gr1DD, normal valves, normal RV & PA pressures...  VENOUS INSUFFICIENCY, CHRONIC (ICD-459.81) - Hx chr ven insuffic w/ edema... on LASIX 457m 1-2 daily (she self medicates)... ~  Labs 3/15 showed Cr=1.5 and she is asked to decr the Lasix to 4061mam...  IMPAIRED GLUCOSE TOLERANCE >>  METABOLIC SYNDROME X (ICDXTG-626.9 she is on diet alone (refused Metformin therapy)... ~  labs 9/08 showed BS=120... FBS 5/08 was 93, HgA1c=7.1 ~  labs 5/10 showed BS= 182, A1c= 6.4 ~  labs 2/11 showed BS= 76, A1c= 6.5 ~  Labs 5/12 showed BS= 103, A1c= 6.5 ~  She saw DrEllison 4/13 w/ A1c=7.1 but she rejects the diagnosis of Diabetes & refused Metform500m43m.. ~  10/13:  We discussed IMPAIRED GLUCOSE TOLERANCE & need for low carb wt reducing diet and incr exercise... ~  9/14: he refused meds; prev eval by DrEllison w/ rec for Metformin Rx but she rejects the DM diagnosis & refused meds; states "I'm eating  healthier" (advised low carb low fat), not checking sugars, prev labs w/ A1c=7.1  ~  3/15: on  diet alone; Labs 3/15 showed BS= 95, A1c= 6.8 ~  9/15: on diet alone; Labs 9/15 showed BS= 119, A1c= 6.7  MORBID OBESITY (ICD-278.01) - she prev saw [name] for counselling about her eating disorder... ~  max weight ~340# before lap band surg 7/09 by DrMartin. ~  weight 12/09 = 291# ~  weight 5/10 = 265# ~  weight 11/10- = 254# ~  weight 2/11 = 260# ~  Weight 5/12= 290# ~  Weight 11/12 = 286# ~  Weight 4/13 = 280# ==> BMI=45 ~  Weight 10/13 = 289# ~  Weight 9/14 = 281# ~  Weight 3/15 = 270# ~  Weight 9/15 = 267#  ? Hx HYPOTHYROIDISM (ICD-244.9) - off thyroid meds since 1/10... she was started on Synthroid and followed by DrRSmith in [place] & last seen 7/09- note reviewed... she refuses f/u by [name]- we will recheck TSH off Synthroid Rx... ~  labs 5/10 off Synthroid since 1/10 showed TSH= 1.13 ~  labs 2/11 showed TSH= 1.82 ~  Labs 5/12 showed TSH= 1.81 ~  2013: She wanted to restart Thyroid hormone but TSH remains normal> referred to Endocrine for further eval but she was upset w/ DrEllison's eval; she will decide if she wants to pursue another opinion... ~  9/14: not currently on meds but she really wants thyroid Rx; prev TFTs all wnl; she prev had Endocrine in [place] prescribe Synthroid for her but she stopped going; clinically & biochemically euthyroid...  ~  3/15: on Synthroid10-12 tab daily; Labs showed TSH= 3.00  GERD (ICD-530.81) - prev on Nexium but off all meds since 1/10... IRRITABLE BOWEL SYNDROME (ICD-564.1) - colonoscopy 1982 by DrPatterson was WNL... She has refused f/u GI eval & f/u colonoscopy...  ?KIDNEY STONE Hx >> pt said she passed a kidney stone 3/11 w/o any medical attention, w/o work up or proof of same; entry made here for future reference if needed.  UTERINE CANCER, HX OF (ICD-V10.42) - s/p laparoscopic hysterectomy & BSO 7/07 by DrSkinner at [place]...    DEGENERATIVE JOINT DISEASE (ICD-715.90) - on VICODIN tid prn,  ANAPROX 267m Bid prn, ULTRAM 546mPrn... ? 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 she was on Wellbutrin & Vicodin when last seen in 2006... VITAMIN D DEFICIENCY (ICD-268.9) - Vit D level 5/10 = 16... rec> start OTC Vit D 2000 u daily. ~  9/14: on Vicodin, Tramadol, Aleve, VitD, etc; prev eval by DrDeveshwar for rheum; now c/o pain in left knee (she thinks it's "siatica"), can't exercise & wants to see [facility]- ok... ~  3/15: she states that topical [name] ("horse linament") rubbed into knees really helps but she wants to keep the Vicodinon hand just in case...  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 [place]/ [place]... prev seeing psychologist [name] for eating disorder counselling... ~  4/13: she mentioned seeing [name] again for eating disorder & counseling for depression etc... ~  9/14: on Alprazolam 0.58mg59md prn; prev eval by Psychiatry w/ seasonal affective disorder; still sees Psychology [name] re: eating disorder; under considerable stress due to relationship w/ sister, financial pressure, etc...  ~  3/15: she has the Alpraz0.58mg 71m prn use but seldom uses it; still sees [name] on occas but doesn't think she needs to continue...  HEALTH  MAINTENANCE:   ~  GI:  Per seen by DrPatterson but last colon was 1982 & she refuses follow up... ~  GYN:  She had Lap hyst & BSO 2007 by DrSkinner at [facility] ~  Immuniz:  She had Pneumovax in the past; she declines Flu shots and Tetanus shots...   Past Surgical History  Procedure Laterality Date  . 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 Appendectomy    . Cholecystectomy  1982  . Laparoscopic hysterectomy  2007    [place]  . Bilateral  salpingoophorectomy  2007    [place]  . Laparoscopic gastric banding  08/2007    Dr. Hassell Done    Outpatient Encounter Prescriptions as of 12/15/2013  Medication Sig  . albuterol (PROAIR HFA) 108 (90 BASE) MCG/ACT inhaler Inhale 2 puffs into the lungs every 6 (six) hours as needed.  . ALPRAZolam (XANAX) 0.5 MG tablet TAKE 1/2 TO 1 TABLET BY MOUTH 3 TIMES DAILY AS NEEDED FOR NERVES. NOT TO EXCEED 3 PER DAY  . B Complex-C-Folic Acid (B-COMPLEX BALANCED) TABS Take 1 tablet by mouth daily.  Raelyn Ensign Pollen 1000 MG TABS Take 2 tablets by mouth daily.  . budesonide (PULMICORT) 0.5 MG/2ML nebulizer solution Take 2 mLs (0.5 mg total) by nebulization 2 (two) times daily.  . cetirizine (ZYRTEC) 10 MG tablet Take 10 mg by mouth daily.    . Cholecalciferol (VITAMIN D-3) 5000 UNITS TABS Take 1 tablet by mouth daily.  . Coconut Oil 1000 MG CAPS Take 4 capsules by mouth daily.  . Coenzyme Q10 (COQ10) 100 MG CAPS Take 1 tablet by mouth daily.  . furosemide (LASIX) 40 MG tablet TAKE 1 TO 2 TABS BY MOUTH ONCE DAILY AS NEEDED FOR SWELLING  . Krill Oil 500 MG CAPS Take 1 capsule by mouth daily.  Marland Kitchen levalbuterol (XOPENEX) 1.25 MG/3ML nebulizer solution Take 1.25 mg by nebulization 2 (two) times daily.  . Magnesium 400 MG CAPS Take 2 capsules by mouth daily.  . naproxen sodium (ANAPROX) 220 MG tablet Take 2 tabs by mouth once daily   . predniSONE (DELTASONE) 20 MG tablet Take 1 tablet two times daily x 4 days, 1 tablet daily x 4 days, 1/2 tablet daily x 4 days, 1/2 tablet every other day until gone  . SPIRIVA HANDIHALER 18 MCG inhalation capsule PLACE 1 CAPSULE (18 MCG TOTAL) INTO INHALER AND INHALE DAILY.  . traMADol (ULTRAM) 50 MG tablet TAKE 1 TABLET BY MOUTH  3 TIMES DAILY   AS NEEDED FOR PAIN  . vitamin C (ASCORBIC ACID) 250 MG tablet Take 250 mg by mouth daily.  . [DISCONTINUED] Fluticasone-Salmeterol (ADVAIR DISKUS) 500-50 MCG/DOSE AEPB Inhale 1 puff into the lungs 2 (two) times daily.    Allergies  Allergen  Reactions  . Penicillins     REACTION: nausea and vomiting    Current Medications, Allergies, Past Medical History, Past Surgical History, Family History, and Social History were reviewed in [employer] record.   Review of Systems         See HPI - all other systems neg except as noted... The patient complains of dyspnea on exertion and difficulty walking; chest discomfort, & leg weakness.  The patient denies anorexia, fever, weight loss, weight gain, vision loss, decreased hearing, hoarseness, syncope, prolonged cough, headaches, hemoptysis, abdominal pain, melena, hematochezia, severe indigestion/heartburn, hematuria, incontinence, suspicious skin lesions, transient blindness, depression, unusual weight change, abnormal bleeding, enlarged lymph nodes, and angioedema.    Objective:  Physical Exam    WD, Morbidly Obese, 63 y/o WF in acute distress fro dyspnea/ wheezing... GENERAL:  Alert & oriented; pleasant & cooperative... HEENT:  [place]/AT, EOM-wnl, PERRLA, EACs-clear, TMs-wnl, NOSE-clear, THROAT-clear & wnl. NECK:  Supple w/ fairROM; no JVD; normal carotid impulses w/o bruits; no thyromegaly or nodules palpated; no lymphadenopathy. CHEST:  decr BS bilat; +accessory musc use, decr BS bilat, +exp wheezing... HEART:  Regular Rhythm; without murmurs/ rubs/ or gallops heard... ABDOMEN:  Obese, soft & nontender; normal bowel sounds; no organomegaly or masses detected. EXT: without deformities, mod arthritic changes; no varicose veins/ +venous insuffic/trace edema. NEURO:  CNs intact; no focal neuro deficits... DERM: few ecchymoses, no rash etc...  RADIOLOGY DATA:  Reviewed in the EPIC EMR & discussed w/ the patient...  LABORATORY DATA:  Reviewed in the EPIC EMR & discussed w/ the patient...   Assessment & Plan:    OSA>  As noted she needs new sleep study & appropriate new CPAP apparatus to facilitate compliance but she declines the split night study...  COPD/  Emphysema>  Acute exac 9/15 (likely viral URI) improved w/ Depo/Pred taper, NEBS Bid, Spiriva, Mucinex etc... Encouraged to stay on her O2 regularly, continue NEBS/ Spiriva regularly & use the Mucinex; needs to lose wt & incr exercise...  Hx CP w/ neg cardiac evals including several Myoviews; we wanted to check 2DEcho to screen for [facility] (she declined). 4/15> she had Cardiology consult for eval of AtypCP [name]); EKG was WNL & 2DEcho showed norm LVF, Gr1DD & norm RV & PA pressures...  OBESITY>  She had remote lap band surg by DrMMartin; Met syndrome, must get wt down etc; she is a lap band failure & prev counseling by [name]...  ? Hx Hypothy>  TFTs remain normal off meds- no problem...  GI>  GERD> she denies symptoms & uses OTC meds prn...  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  Dysthymia>  Much stress in her life, on Alpraz prn; has embraced alt lifestyle...   Patient's Medications  New Prescriptions   No medications on file  Previous Medications   ALBUTEROL (PROAIR HFA) 108 (90 BASE) MCG/ACT INHALER    Inhale 2 puffs into the lungs every 6 (six) hours as needed.   ALPRAZOLAM (XANAX) 0.5 MG TABLET    TAKE 1/2 TO 1 TABLET BY MOUTH 3 TIMES DAILY AS NEEDED FOR NERVES. NOT TO EXCEED 3 PER DAY   B COMPLEX-C-FOLIC ACID (B-COMPLEX BALANCED) TABS    Take 1 tablet by mouth daily.   BEE POLLEN 1000 MG TABS    Take 2 tablets by mouth daily.   BUDESONIDE (PULMICORT) 0.5 MG/2ML NEBULIZER SOLUTION    Take 2 mLs (0.5 mg total) by nebulization 2 (two) times daily.   CETIRIZINE (ZYRTEC) 10 MG TABLET    Take 10 mg by mouth daily.     CHOLECALCIFEROL (VITAMIN D-3) 5000 UNITS TABS    Take 1 tablet by mouth daily.   COCONUT OIL 1000 MG CAPS    Take 4 capsules by mouth daily.   COENZYME Q10 (COQ10) 100 MG CAPS    Take 1 tablet by mouth daily.   FUROSEMIDE (LASIX) 40 MG TABLET    TAKE 1 TO 2 TABS BY MOUTH ONCE DAILY AS NEEDED FOR SWELLING   KRILL OIL 500 MG CAPS     Take 1 capsule by mouth daily.   LEVALBUTEROL (XOPENEX) 1.25 MG/3ML NEBULIZER SOLUTION    Take 1.25 mg by  nebulization 2 (two) times daily.   MAGNESIUM 400 MG CAPS    Take 2 capsules by mouth daily.   NAPROXEN SODIUM (ANAPROX) 220 MG TABLET    Take 2 tabs by mouth once daily    PREDNISONE (DELTASONE) 20 MG TABLET    Take 1 tablet two times daily x 4 days, 1 tablet daily x 4 days, 1/2 tablet daily x 4 days, 1/2 tablet every other day until gone   SPIRIVA HANDIHALER 18 MCG INHALATION CAPSULE    PLACE 1 CAPSULE (18 MCG TOTAL) INTO INHALER AND INHALE DAILY.   TRAMADOL (ULTRAM) 50 MG TABLET    TAKE 1 TABLET BY MOUTH  3 TIMES DAILY   AS NEEDED FOR PAIN   VITAMIN C (ASCORBIC ACID) 250 MG TABLET    Take 250 mg by mouth daily.  Modified Medications   No medications on file  Discontinued Medications   FLUTICASONE-SALMETEROL (ADVAIR DISKUS) 500-50 MCG/DOSE AEPB    Inhale 1 puff into the lungs 2 (two) times daily.

## 2013-12-15 NOTE — Patient Instructions (Signed)
Today we updated your med list in our EPIC system...    Continue your current medications the same...  OK to wean off the Prednisone...  Be sure to use the NEBULIZER regularly as we discussed...  Call for any questions...  Let's plan a follow up visit in 60mo, sooner if needed for problems.Marland KitchenMarland Kitchen

## 2014-01-01 ENCOUNTER — Other Ambulatory Visit: Payer: Self-pay | Admitting: Pulmonary Disease

## 2014-01-02 ENCOUNTER — Other Ambulatory Visit: Payer: Self-pay | Admitting: Pulmonary Disease

## 2014-01-02 ENCOUNTER — Telehealth: Payer: Self-pay | Admitting: Pulmonary Disease

## 2014-01-02 MED ORDER — FUROSEMIDE 40 MG PO TABS: ORAL_TABLET | ORAL | Status: DC

## 2014-01-02 NOTE — Telephone Encounter (Signed)
Refill of the lasix has been sent to the pharmacy per pts request. Nothing further is needed.

## 2014-01-09 ENCOUNTER — Telehealth: Payer: Self-pay | Admitting: Adult Health

## 2014-01-09 NOTE — Telephone Encounter (Signed)
Called and spoke with pt and she stated that she would like to speak with TP--she would like to try an antidepressant medication to use for a while to help her get through this.  She stated that she lost her dog about 3 months ago and she has been searching for her and cannot find her.  She stated that that is all she thinks about and searches for her everytime she leaves the house.  She stated that this is affecting her breathing and sleeping, etc.  She is wanting to try something to see if this will help her and only wanted to speak with TP.  TP please advise. Thanks  Allergies  Allergen Reactions  . Penicillins     REACTION: nausea and vomiting    Current Outpatient Prescriptions on File Prior to Visit  Medication Sig Dispense Refill  . albuterol (PROAIR HFA) 108 (90 BASE) MCG/ACT inhaler Inhale 2 puffs into the lungs every 6 (six) hours as needed. 1 Inhaler 6  . ALPRAZolam (XANAX) 0.5 MG tablet TAKE 1/2 TO 1 TABLET BY MOUTH 3 TIMES DAILY AS NEEDED FOR NERVES. NOT TO EXCEED 3 PER DAY 90 tablet 5  . B Complex-C-Folic Acid (B-COMPLEX BALANCED) TABS Take 1 tablet by mouth daily.    Raelyn Ensign Pollen 1000 MG TABS Take 2 tablets by mouth daily.    . budesonide (PULMICORT) 0.5 MG/2ML nebulizer solution Take 2 mLs (0.5 mg total) by nebulization 2 (two) times daily. 120 mL 12  . cetirizine (ZYRTEC) 10 MG tablet Take 10 mg by mouth daily.      . Cholecalciferol (VITAMIN D-3) 5000 UNITS TABS Take 1 tablet by mouth daily.    . Coconut Oil 1000 MG CAPS Take 4 capsules by mouth daily.    . Coenzyme Q10 (COQ10) 100 MG CAPS Take 1 tablet by mouth daily.    . furosemide (LASIX) 40 MG tablet TAKE 1 TO 2 TABS BY MOUTH ONCE DAILY AS NEEDED FOR SWELLING 60 tablet 4  . Krill Oil 500 MG CAPS Take 1 capsule by mouth daily.    Marland Kitchen levalbuterol (XOPENEX) 1.25 MG/3ML nebulizer solution Take 1.25 mg by nebulization 2 (two) times daily. 180 mL 6  . Magnesium 400 MG CAPS Take 2 capsules by mouth daily.    . naproxen sodium  (ANAPROX) 220 MG tablet Take 2 tabs by mouth once daily     . predniSONE (DELTASONE) 20 MG tablet Take 1 tablet two times daily x 4 days, 1 tablet daily x 4 days, 1/2 tablet daily x 4 days, 1/2 tablet every other day until gone 20 tablet 0  . SPIRIVA HANDIHALER 18 MCG inhalation capsule PLACE 1 CAPSULE (18 MCG TOTAL) INTO INHALER AND INHALE DAILY. 30 capsule 5  . traMADol (ULTRAM) 50 MG tablet TAKE 1 TABLET BY MOUTH  3 TIMES DAILY   AS NEEDED FOR PAIN 90 tablet 1  . vitamin C (ASCORBIC ACID) 250 MG tablet Take 250 mg by mouth daily.     No current facility-administered medications on file prior to visit.

## 2014-01-10 MED ORDER — SERTRALINE HCL 100 MG PO TABS: ORAL_TABLET | ORAL | Status: DC

## 2014-01-10 NOTE — Telephone Encounter (Signed)
Per SN---  Call in zoloft 100 mg  1/2 tablet by mouth daily.  #30.  This has been sent to her pharmacy.  i have called the pt and she i aware and stated that she will pick this up tomorrow  Pt is aware to monitor how she is doing on this and call for any questions or concerns.

## 2014-01-10 NOTE — Telephone Encounter (Signed)
Patient calling back can to see if a rush can be put on getting rx for something to help manage her depression

## 2014-01-10 NOTE — Telephone Encounter (Signed)
Per TP: I have not seen this patient since 04/2012 and not familiar with any current issues.  Dr Lenna Gilford will need to address this.  Thanks.

## 2014-01-10 NOTE — Telephone Encounter (Signed)
SN please advise. thanks 

## 2014-02-15 ENCOUNTER — Ambulatory Visit (INDEPENDENT_AMBULATORY_CARE_PROVIDER_SITE_OTHER): Payer: Medicare Other | Admitting: Pulmonary Disease

## 2014-02-15 ENCOUNTER — Encounter: Payer: Self-pay | Admitting: Pulmonary Disease

## 2014-02-15 VITALS — BP 132/78 | HR 90 | Temp 97.8°F | Ht 66.0 in | Wt 274.1 lb

## 2014-02-15 DIAGNOSIS — G4733 Obstructive sleep apnea (adult) (pediatric): Secondary | ICD-10-CM

## 2014-02-15 DIAGNOSIS — R7302 Impaired glucose tolerance (oral): Secondary | ICD-10-CM

## 2014-02-15 DIAGNOSIS — M150 Primary generalized (osteo)arthritis: Secondary | ICD-10-CM

## 2014-02-15 DIAGNOSIS — K219 Gastro-esophageal reflux disease without esophagitis: Secondary | ICD-10-CM

## 2014-02-15 DIAGNOSIS — J449 Chronic obstructive pulmonary disease, unspecified: Secondary | ICD-10-CM

## 2014-02-15 DIAGNOSIS — M159 Polyosteoarthritis, unspecified: Secondary | ICD-10-CM

## 2014-02-15 DIAGNOSIS — E8881 Metabolic syndrome: Secondary | ICD-10-CM

## 2014-02-15 DIAGNOSIS — E039 Hypothyroidism, unspecified: Secondary | ICD-10-CM

## 2014-02-15 DIAGNOSIS — J9611 Chronic respiratory failure with hypoxia: Secondary | ICD-10-CM | POA: Diagnosis not present

## 2014-02-15 DIAGNOSIS — J9610 Chronic respiratory failure, unspecified whether with hypoxia or hypercapnia: Secondary | ICD-10-CM | POA: Insufficient documentation

## 2014-02-15 MED ORDER — FLUTICASONE-SALMETEROL 500-50 MCG/DOSE IN AEPB: 1.0000 | INHALATION_SPRAY | Freq: Two times a day (BID) | RESPIRATORY_TRACT | Status: DC

## 2014-02-15 MED ORDER — TIOTROPIUM BROMIDE MONOHYDRATE 18 MCG IN CAPS: ORAL_CAPSULE | RESPIRATORY_TRACT | Status: DC

## 2014-02-15 NOTE — Patient Instructions (Signed)
Today we updated your med list in our EPIC system...    Continue your current medications the same...  We reviewed your Oxygen therapy, Advair500, Spiriva, NEBS w/ Albuterol, Mucinex, etc...  Call for any questions...  Let's plan a follow up visit in 3-33mo, sooner if needed for problems.Marland KitchenMarland Kitchen

## 2014-02-15 NOTE — Progress Notes (Signed)
HPI  Review of Systems  Physical Exam  Patient ID: PAMELA INTRIERI, female   DOB: 02/06/1951, 63 y.o.   MRN: [id number]  Subjective:    Patient ID: [name], female    DOB: [birth year], 63 y.o.   MRN: [id number]  HPI 63 y/o WF known to me w/ mult med problems as noted below...  ~  SEE PREV EPIC NOTES FOR OLDER DATA >>   ~  November 04, 2012:  30moROV & [name] has lost 8# down to 281# today but she continues w/ numerous complaints, nothing is better she says, see below; We reviewed the following medical problems during today's office visit >>     Hx mild OSA w/ mod desat, loud snoring, & leg jerks (on sleep study 2005)> she tried CPAP10 but stopped on her own & declined to repeat split night study & re-try CPAP rx...    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 1993; supposed to be on HomeO2- 2L rest & 4L exercise, NEBS w/ Albut, Advair500Bid, Spiriva daily, Mucinex1-2Bid but not taking anything regularly; states that Flutter & Mucinex cause "spasms" in her back; states O2 doesn't help since she can't breathe thru her nose (she has been eval by ENT, CErnesto Rutherford; [facility] tried [facility] after 278[facility] "fruitless"; states she "can't breathe at all, can't do anything" etc; SOB w/ ADLs=> O2 recert today (see below)... Note> [facility] and last PFT (2011) showed FEV1= 1.5 (55%) & %1sec=59...    Hx AtypCP> baseline EKG w/ NSSTTWA; prev 2DEcho w/ norm LVF, EF=50-55%, mild MR/TR, norm LA/RV; Myoview 2009 normal w/o ischemia & EF=55%...    Ven Insuffic> on Lasix40-2Qam; she knows not to eat sodium, elev legs, wear support hose...    Impaired Gluc Tolerance, Metabolic syndrome> she refused meds; prev eval by DrEllison w/ rec for Metformin Rx but she rejects the DM diagnosis & refused meds; states "I'm eating healthier" (advised low carb low fat), not checking sugars, prev labs w/ A1c=7.1    ?Hypothyroid> not currently on meds but she really wants thyroid Rx; prev TFTs all wnl;  she prev had Endocrine in [place] prescribe Synthroid for her but she stopped going; clinically & biochemically euthyroid...    Morbid Obesity> peak weight ~340# before lap band surg 2009 by DrMMartin; lost to ~250# but then back up to 280-90#; refused f/u w/ [facility] due to cost of visits & lap-band adjustments; we reviewed diet/ exercise...    GI- GERD, IBS> she uses OTC PPI as needed; last colonoscopy was 1982 by DrPatterson & wnl, she refuses GI referral & refuses f/u colon etc...    GYN- s/p uterine cancer> [facility].    DJD, ?FM, VitD defic> on Vicodin, Tramadol, Aleve, VitD, etc; prev eval by DrDeveshwar for rheum; now c/o pain in left knee (she thinks it's "siatica"), can't exercise & wants to see [facility]- ok...    Anxiety/ Depression> on Alprazolam 0.31m57mid prn; prev eval by Psychiatry w/ seasonal affective disorder; still sees Psychology [name] re: eating disorder; under considerable stress due to relationship w/ sister, financial pressure, etc... We reviewed prob list, meds, xrays and labs> see below for updates >> she take numerous herbs, supplements, etc- often rec to her by her alternative lifestyle acquaintances...      OXYGEN RE-CERTIFICATION:   9/57/4/16 1.  The pt has a continued need for home oxygen therapy due to her severe  COPD/ emphysema...    2.  She is using the oxygen in the home w/ exercise (4L/min), and at night (2L/min)...    3.  O2 sats at rest on RA today=> 92% w/ pulse rate 82/min...     4.  Pt exercised in office on RA => after 2laps O2 sat nadir= 86% w/ pulse rate=105...    5.  Pt placed on O2 at 4L/min via  & exercise repeated => after 2laps O2 sat nadir= 92% w/ pulse rate=102  ~  May 05, 2013:  46moROV & when GArtis Delaywas seen last (9/14) she was placed on O2 but she was miserable- "couldn't do anything"; she tells me she started doing her own research & found out that Magnesium is very important (helps her leg cramps) and you can  still be low w/ normal blood levels in fact everybody is deficient so she started taking a special easily absorbed magnesium supplement and within 4H of the 1st dose her breathing was 100% better & she feels that her breathing is cured, hasn't needed any oxygen (but wants to keep it just in case), and now she's walking, exercising, & she's lost 10#...  She also wanted to let me know about the benefits to [name] (horse linament) that she rubs into her knees and it works great, it's a great anti-inflamm & analgesic, doesn't need any Hydrocodone etc... Finally she wanted to let me know about 2 supplements that have been very helpful- "Clear Lungs" and "Lung tonic"...  We reviewed the following medical problems during today's office visit >>     Hx mild OSA w/ mod desat, loud snoring, & leg jerks (on sleep study 2005)> she tried CPAP10 but stopped on her own & declined to repeat split night study & re-try CPAP rx; she says she's sleeping satis & wakes feeling refreshed...    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 1993; supposed to be on HomeO2- 2L rest & 4L exercise, NEBS w/ Albut, Advair500Bid, Spiriva daily, Mucinex1-2Bid but not taking anything regularly; states that Flutter & Mucinex cause "spasms" in her back; states O2 doesn't help since she can't breathe thru her nose (she has been eval by ENT, CErnesto Rutherford; finally tried [facility]but stopped after 264mos "fruitless"; prev stated she "can't breathe at all, can't do anything" etc; SOB w/ ADLs etc; Note> [facility] and last PFT (2011) showed FEV1= 1.5 (55%) & %1sec=59 ==> 3/15 she announced that she's been cured by taking a Magnesium supplement, along w/ herbal formula "Clear lungs" and "Lung tonic"; she has stopped her Oxygen, still using Advair500 & Spiriva, using Mucinex 7 AlbutHFA just prn...     Hx AtypCP> baseline EKG w/ NSSTTWA; prev 2DEcho w/ norm LVF, EF=50-55%, mild MR/TR, norm LA/RV; Myoview 2009 normal w/o ischemia & EF=55%; she notes  some atypCP & requests f/u cardiac eval (prev seen by DrWall)...    Ven Insuffic> on Lasix40-2Qam; she knows not to eat sodium, elev legs, wear support hose; Labs 3/15 showed Cr=1.5 & she is asked to decr the Lasix to 402mm...    Impaired Gluc Tolerance, Metabolic syndrome> she refused meds; prev eval by DrEllison w/ rec for Metformin Rx but she rejects the DM diagnosis & refused meds; states "I'm eating healthier" (advised low carb low fat); Labs 3/15 showed BS=95, A1c=6.8... Marland KitchenMarland Kitchen ?Hypothyroid> not currently on meds but she really wants thyroid Rx; prev TFTs all wnl; she prev had Endocrine in [place] prescribe Synthroid for  her but she stopped going; clinically & biochemically euthyroid...    Morbid Obesity> peak weight ~340# before lap band surg 2009 by DrMMartin; lost to ~250# but then back up to 280-90#; refused f/u w/ [facility] due to cost of visits & lap-band adjustments; 3/15 weight down to 270# & we reviewed diet & exercise...    GI- GERD, IBS> she uses OTC PPI as needed; last colonoscopy was 1982 by DrPatterson & wnl, she refuses GI referral & refuses f/u colon etc...    GYN- s/p uterine cancer> s/p lap hyst & BSO [facility]...    DJD, ?FM, VitD defic> prev on Vicodin, Tramadol, Aleve, VitD; prev eval by DrDeveshwar for rheum; now she states that [name] ("horse linament") applied topically has worked like a miracle & she has increased exercise etc...    Anxiety/ Depression> on Alprazolam 0.31m prn; prev eval by Psychiatry w/ seasonal affective disorder; still sees Psychology [name] re: eating disorder; under considerable stress due to relationship w/ sister, financial pressure, etc... We reviewed prob list, meds, xrays and labs> see below for updates >> she refused the 2014 Flu vaccine... She has declined TDAP & Pneumovax as well...  CXR 3/15 showed underlying COPD, atelectasis in RML, DJD in TSpine, NAD; Rec to take OTC Mucinex600-2Bid w/ fluids...  LABS 3/15:  Chems- BS=95 A1c=6.8  (needs low carb diet, exercise, wt loss) & Creat=1.5 (mild renal insuffic due to Lasix rx; Rec to decrease Lasix40 to 1 tab Qam...   ADDENDUM>>  she had Cards eval DCherly Hensen4/15> he did not feel that invasive testing was warranted; she had 2DEcho but refused Myoview due to co-pay costs...   2DEcho 4/15 showed norm LV sys function w/ EF=60-65%, normal wall motion, Gr1DD, normal valves, normal RV & PA pressures...  ~  November 07, 2013:  680moOV & [name] reports "I'm doing great, just a little arthritis in my mouse hand"; she also relates a lot of grief over the loss of her 9y/o Min-Pin dog Brixx who ran away several mo ago;  She reports that she participated in a medical study out of [place] regarding spacers for folks w/ COPD using MDIs, no ch in her meds required;  She remains on ADVAIR500Bid, SPIRIVA daily, & ProairHFA prn (uses 3-4 x daily "It really helps");  She has Home O2 but dislikes the Liq O2 therefore not using & she wants a portable O2 concentrator to read 4L/min w/ exercise, 2L/min at rest;  She still doesn't like the nasal cannula, doesn't feel that it gets into her system but like the face mask attachment because she can feel it better- of course we discussed all these issues (again)...     She has continued to research her supplements and herbal meds>  She tells me that she is living on smoothies she makes out of juice, ice, baking soda ("only a tiny bit"), pomegranate syrup, & local honey;  Tumeric helps the arthritis in her knees and she has decreased the amt of aleve she was using;  She notes that pinching her upper lip in the middle under her nose helps relieve musc cramps in her legs;  She states that she hasn't had any palpit or skip beats since starting on a Magnesium supplement...    She still sees [name] for help w/ her eating disorder but was a bit upset that he didn't have a diet handout for her;  We gave her the papers we have here to go by... We reviewed prob  list, meds, xrays and  labs> see below for updates >> she declines the 2015 Flu vaccine today...  Ambulatory O2 Test 9/15>  O2 sat on RA at rest= 92% w/ pulse=86;  Nadir O2sat on RA after 2 laps= 84% w/ pulse=96...   ~  November 17, 2013:  Artis Delay presents for an urgent add-on appt (refused to go to the ER) w/ acute SOB; she called yest c/o chest tightness, cough, sm amt of whitish sput & wanted something to "dry me up" (given antihist)- she refused antibiotics and Prednisone;   drove herself to the office where she was found to be tachypneic w/ RR=24, hypoxemic on RA w/ O2sat=83%; she denies f/c/s, discolored sput, CP; chest exam w/ decr BS, exp wheezing & using accessory muscles; dry cough, no rales or consolidation...    RX> placed on O2 via mask w/ improvement in sats to 93%; given NEB treatment w/ Xopenex 0.63 x2; given Depo120 as well => improved w/ better air movement, decr wheezing, and O2sat up to 97%    REC> to change Advair500 to NEBS w/ ALBUT2.80m Qid (?Xopenex not covered)/ BUDES0.514mBid; Pred 2042m4d tapering sched, Mucinex 1200m50md & fluids... Continue Spiriva daily and home Oxygen... We reviewed prob list, meds, xrays and labs> see below for updates >> reminded of low carb, no sweets, get wt down...  CXR today showed COPD/emphysema, no acute abnormality...  LABS 9/15:  Chems- ok w/ BS=119, A1c=6.7, Cr=1.4;  CBC- ok...  ~  December 15, 2013:  25mo 525mo& [name] iArtis Delayuch improved on the Pred taper=> now down to 1/2 Qod but she wants off, feels worse on the day she takes it she says; advised slow taper to 1/2 Q3d til gone, and reminded to use the Xopenex/Budes NEB Bid regularly, and the Mucinex 1200mg 24mw/ Fluids...  She tells me that she has had some recent dental problems and inquired about a state sponsored dental clinic option but there were no openings unless her doctor wrote a letter indicating that her dental prob was contrib to her medical illness etc=> letter written & given to pt per request...  She has  been using the Xanax but feels it is too strong for her- even 1/2 tab at bedtime gives her a hangover & she is advised to further decr the dose until she finds the best combination of rest & no morning side effect... Plan ROV 68mo.  10moecember 17, 2015:  68mo [id number] is cArtis Delayleft arm pain- shoulder down to the wrist "it's a nerve" she says; discomfort present for 2 wks, can't sleep, "hurt so much I cried", tried OTC meds, horse linament, Tramadol, Hydrocodone & none w/ relief; using heating pad which helps some, she denies neck pain or decr ROM=> we discussed [facility]...     She has weaned off the Pred as noted above but she also stopped the Xopenex/ Budesonide via NEB because it was too expensive; she is back on her Advair500-2spBid & Spiriva daily; she has Albuterol2.5 for the Nebulizer to use prn; she continues to take Mucinex1200Bid + Fluids; she notes that her breathing is stable- DOE w/ ADLs and no real improvement, she has not been able to exercise, or diet, and weight is up to 274# w/ BMI~44;  She still desaturates w/ activity on RA but she refuses to wear the oxygen regularly as prescribed- states she uses it intermittently if SOB & take it off  5-120 min later when she recovers;  She has not yet had her dental work;  Zoloft seems to be helping her depression but she does not want to increase from 85m to 1048md;  She [facility] about her dog Brix who ran away earlier this yr...     We reviewed prob list, meds, xrays and labs> see below for updates >> she refuses the 2015 flu vaccine....            Problem List:    OBSTRUCTIVE SLEEP APNEA (ICD-327.23) - sleep study 2005 showed RDI=15 w/ desat to 78%, loud snoring & +leg jerks> on CPAP 10, prev used intermittently but now not at all... ~  11/12:  She has agreed to a new Sleep Study- split night protocol, to recheck her OSA & determine CPAP needs (check ABG if poss too)==>  she never followed thru w/ the study. ~  Pt states that she is resting satis and wakes refreshed, energy better... ~  12/15: she reports not resting due to left shoulder/ arm pain & we will refer to Ortho...  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 supposed to be on NEBULIZER Qid, ADVAIR 500Bid + SPIRIVA daily (compliance is a serious issue- she freq stops all meds) ==> [facility]... ~  A1AT level is normal 218 (83-200) 3/09... ~  baseline CXR w/ borderline Cor, COPD, interstitial scarring/ atelectasis/ obesity... ~  PFTs 3/07 showed FVC=2.74 (82%), FEV1=1.67 (62%), %1sec=61, mid-flows=27%pred... improved from 2005. ~  CTChest in 2007 & 4/09 showed severe centrilob emyphysema, + interstitial fibrosis, sm HH, left renal cyst... neg for PE... ~  2010:  breathing improved w/ weight reduction after Lap-band surg... ~  2/11: COPD exac w/ neg CXR (chr changes, NAD), Rx w/ Depo/ Pred/ Avelox/ Mucinex/ NEBS/ Advair/ Spiriva/ etc... ~  PFTs 3/11 showed FVC= 2.57 (73%), FEV1= 1.51 (55%), %1sec=59, mid-flows= 21%pred. ~  Noncompliant w/ Rx & O2 thru 2012... On disability since 2011 & finally got Medicare coverage 9/12... ~  11/12: presents w/ worsening breathing & dyspnea w/ ADLs ==> we outlined further eval w/ 2DEcho, Sleep Study; Rx w/ NEBS QID, Advair500Bid, Spiriva daily, Mucinex, Oxygen, Lasix, Zoloft, Xanax, etc (she declined to proceed w/ the sleep study, 2DEcho, etc)... 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  ~  4/13: she is c/o the nasal O2 not helping since it's hard to breathe thru nose & wants ENT referral==> saw [name] who wanted to do surg but she has severe COPD & hi risk; asked to start PulmRehab & she agrees... ~  10/13: she did PulmRehab for 43m30mo7/13 but quit since it was "fruitless"- barely got to do any exercise since it  was so difficult getting to the facility,etc; still c/o nasal problems limiting her benefit from O2 & we discussed the poss of ?transtracheal oxygen? ~  9/14: exsmoker quit 1993; supposed to be on HomeO2- 2L rest & 4L exercise, NEBS w/ Albut, Advair500Bid, Spiriva daily, Mucinex1-2Bid but not taking anything regularly; states that Flutter & Mucinex cause "spasms" in her back; states O2 doesn't help since she can't breathe thru her nose (she has been eval by ENT, [facility] stopped after 43mo46mo"fruitless"; states she "can't breathe at all, can't do anything" etc; SOB w/ ADLs=> O2 recert today (see below)... Note> she  was eval at [facility] [place] in 2006 and last PFT (2011) showed FEV1= 1.5 (55%) & %1sec=59... OXYGEN RE-CERT done today... ~  3/15: see above- pt states that a magnesium supplement along w/ herbal supplements "clear lungs" and "lung tonic" have made all the difference & she no longer needs oxygen, exercising regularly & much improved; still taking Advair500, Spiriva, AlbutHFA prn...  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 ~  CXR 9/15 showed COPD/emphysema, no acute abnormality... ~  11/07/13: Ambulatory O2 Test 9/15> O2 sat on RA at rest= 92% w/ pulse=86;  Nadir O2sat on RA after 2 laps= 84% w/ pulse=96...  ~  11/17/13: she presented w/ acute SOB, wheezing, COPD exac> treated w/ NEBS, Depo120, Pred taper, Mucinex1200Bid, fluids, & continue the Spiriva & Oxygen; ch her Advair to NEBS w/ Xopenex & Budesonide regularly... ~  10/15: she is much improved w/ Pred taper but she wants off; reminded to use NEBS w/ Xopenex & Budes Bid every day... ~  12/15: she is off the Pred, but also stopped the Xopenex/ Budesonide due to cost; she has gone back on her Advair500 & Spiriva, plus Albut via NEBS, Mucinex/ Fluids/ etc...  ATYPICAL CHEST PAIN (ICD-786.50) - eval by DrWall in 2009. ~  baseline EKG w/ NSR,  PVC's, NSSTTWA, NAD... ~  2DEcho 2/05 showed mild MR/ TR, norm LA/ RV, EF=50-55%... ~  NuclearStressTest 4/09 was norm- no ischemia, EF=55%... similiar to prev studies at [place] & 2007... ~  3/15: she notes some atypCP & requests a cardiac referral for eval- prev seen by DrWall, we will call for Cards consult per her request... ~  EKG 3/15 showed NSR, rate91, occas PVCs, otherw wnl... ~  4/15: she had Cards eval DrNishan> he did not feel that invasive testing was warranted; she had 2DEcho but refused Myoview due to co-pay costs...  ~  2DEcho 4/15 showed norm LV sys function w/ EF=60-65%, normal wall motion, Gr1DD, normal valves, normal RV & PA pressures...  VENOUS INSUFFICIENCY, CHRONIC (ICD-459.81) - Hx chr ven insuffic w/ edema... on LASIX 76m- 1-2 daily (she self medicates)... ~  Labs 3/15 showed Cr=1.5 and she is asked to decr the Lasix to 465mQam...  IMPAIRED GLUCOSE TOLERANCE >>  METABOLIC SYNDROME X (ICYIR-485.4- she is on diet alone (refused Metformin therapy)... ~  labs 9/08 showed BS=120... FBS 5/08 was 93, HgA1c=7.1 ~  labs 5/10 showed BS= 182, A1c= 6.4 ~  labs 2/11 showed BS= 76, A1c= 6.5 ~  Labs 5/12 showed BS= 103, A1c= 6.5 ~  She saw DrEllison 4/13 w/ A1c=7.1 but she rejects the diagnosis of Diabetes & refused Metform50066m... ~  10/13:  We discussed IMPAIRED GLUCOSE TOLERANCE & need for low carb wt reducing diet and incr exercise... ~  9/14: he refused meds; prev eval by DrEllison w/ rec for Metformin Rx but she rejects the DM diagnosis & refused meds; states "I'm eating healthier" (advised low carb low fat), not checking sugars, prev labs w/ A1c=7.1  ~  3/15: on diet alone; Labs 3/15 showed BS= 95, A1c= 6.8 ~  9/15: on diet alone; Labs 9/15 showed BS= 119, A1c= 6.7  MORBID OBESITY (ICD-278.01) - she prev saw [name] for counselling about her eating disorder... ~  max weight ~340# before lap band surg 7/09 by DrMartin. ~  weight 12/09 = 291# ~  weight 5/10 = 265# ~   weight 11/10- = 254# ~  weight 2/11 = 260# ~  Weight 5/12= 290# ~  Weight 11/12 = 286# ~  Weight 4/13 = 280# ==> BMI=45 ~  Weight 10/13 = 289# ~  Weight 9/14 = 281# ~  Weight 3/15 = 270# ~  Weight 9/15 = 267# ~  Weight 12/15 = 274#  ? Hx HYPOTHYROIDISM (ICD-244.9) - off thyroid meds since 1/10... she was started on Synthroid and followed by DrRSmith in [place] & last seen 7/09- note reviewed... she refuses f/u by [name]- we will recheck TSH off Synthroid Rx... ~  labs 5/10 off Synthroid since 1/10 showed TSH= 1.13 ~  labs 2/11 showed TSH= 1.82 ~  Labs 5/12 showed TSH= 1.81 ~  2013: She wanted to restart Thyroid hormone but TSH remains normal> referred to Endocrine for further eval but she was upset w/ DrEllison's eval; she will decide if she wants to pursue another opinion... ~  9/14: not currently on meds but she really wants thyroid Rx; prev TFTs all wnl; she prev had Endocrine in [place] prescribe Synthroid for her but she stopped going; clinically & biochemically euthyroid...  ~  3/15: on Synthroid100-12 tab daily; Labs showed TSH= 3.00 ~  She stopped the Synthroid again...  GERD (ICD-530.81) - prev on Nexium but off all meds since 1/10... IRRITABLE BOWEL SYNDROME (ICD-564.1) - colonoscopy 1982 by DrPatterson was WNL... She has refused f/u GI eval & f/u colonoscopy...  ?KIDNEY STONE Hx >> pt said she passed a kidney stone 3/11 w/o any medical attention, w/o work up or proof of same; entry made here for future reference if needed.  UTERINE CANCER, HX OF (ICD-V10.42) - s/p laparoscopic hysterectomy & BSO 7/07 by DrSkinner at [place]...   DEGENERATIVE JOINT DISEASE (ICD-715.90) - on VICODIN tid prn,  ANAPROX 21m Bid prn, ULTRAM 552mPrn... ? 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 she was on Wellbutrin & Vicodin when last seen in 2006... VITAMIN D DEFICIENCY (ICD-268.9) - Vit D level 5/10 = 16... rec> start  OTC Vit D 2000 u daily. ~  9/14: on Vicodin, Tramadol, Aleve, VitD, etc; prev eval by DrDeveshwar for rheum; now c/o pain in left knee (she thinks it's "siatica"), can't exercise & wants [facility]- ok... ~  3/15: she states that topical [name] ("horse linament") rubbed into knees really helps but she wants to keep the Vicodinon hand just in case...  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 she has seen psychiatry in the past [place] w/ [place] affective [place]... prev seeing psychologist [name] for eating disorder counselling... ~  4/13: she mentioned seeing [name] again for eating disorder & counseling for depression etc... ~  9/14: on Alprazolam 0.28mg82md prn; prev eval by Psychiatry w/ seasonal affective disorder; still sees Psychology [name] re: eating disorder; under considerable stress due to relationship w/ sister, financial pressure, etc...  ~  3/15: she has the Alpraz0.28mg 72m prn use but seldom uses it; still sees [name] on occas but doesn't think she needs to continue... ~  11/15:  She wants antidepreesant med & we discussed Zoloft tria 100mg-53m => 1 tab daily...   HEALTH MAINTENANCE:   ~  GI:  Per seen by DrPatterson but last colon was 1982 & she refuses follow up... ~  GYN:  She had Lap hyst & BSO 2007 by [facility]Skinner [facility]  Immuniz:  She had Pneumovax in the past; she declines Flu shots and Tetanus shots...   Past Surgical History  Procedure Laterality Date  . 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 Appendectomy    . Cholecystectomy  1982  . [place]  [place]    [facility]  . Bilateral [place]omy  [place]    [facility]  . Laparoscopic gastric banding  08/2007    Dr. Hassell Done    Outpatient Encounter Prescriptions as of 02/15/2014  Medication Sig  . albuterol (PROAIR HFA) 108 (90 BASE) MCG/ACT inhaler Inhale 2 puffs into the lungs every  6 (six) hours as needed.  . ALPRAZolam (XANAX) 0.5 MG tablet TAKE 1/2 TO 1 TABLET BY MOUTH 3 TIMES DAILY AS NEEDED FOR NERVES. NOT TO EXCEED 3 PER DAY  . B Complex-C-Folic Acid (B-COMPLEX BALANCED) TABS Take 1 tablet by mouth daily.  Raelyn Ensign Pollen 1000 MG TABS Take 2 tablets by mouth daily.  . cetirizine (ZYRTEC) 10 MG tablet Take 10 mg by mouth daily.    . Cholecalciferol (VITAMIN D-3) 5000 UNITS TABS Take 1 tablet by mouth daily.  . Coconut Oil 1000 MG CAPS Take 4 capsules by mouth daily.  . Coenzyme Q10 (COQ10) 100 MG CAPS Take 1 tablet by mouth daily.  . Fluticasone-Salmeterol (ADVAIR) 500-50 MCG/DOSE AEPB Inhale 1 puff into the lungs 2 (two) times daily.  . furosemide (LASIX) 40 MG tablet TAKE 1 TO 2 TABS BY MOUTH ONCE DAILY AS NEEDED FOR SWELLING  . Krill Oil 500 MG CAPS Take 1 capsule by mouth daily.  Marland Kitchen levalbuterol (XOPENEX) 1.25 MG/3ML nebulizer solution Take 1.25 mg by nebulization 2 (two) times daily.  . Magnesium 400 MG CAPS Take 2 capsules by mouth daily.  . naproxen sodium (ANAPROX) 220 MG tablet Take 2 tabs by mouth once daily   . sertraline (ZOLOFT) 100 MG tablet Take 1/2 tablet by mouth daily  . tiotropium (SPIRIVA HANDIHALER) 18 MCG inhalation capsule PLACE 1 CAPSULE (18 MCG TOTAL) INTO INHALER AND INHALE DAILY.  . traMADol (ULTRAM) 50 MG tablet TAKE 1 TABLET BY MOUTH  3 TIMES DAILY   AS NEEDED FOR PAIN  . vitamin C (ASCORBIC ACID) 250 MG tablet Take 250 mg by mouth daily.  . [DISCONTINUED] Fluticasone-Salmeterol (ADVAIR) 500-50 MCG/DOSE AEPB Inhale 1 puff into the lungs 2 (two) times daily.  . [DISCONTINUED] SPIRIVA HANDIHALER 18 MCG inhalation capsule PLACE 1 CAPSULE (18 MCG TOTAL) INTO INHALER AND INHALE DAILY.  Marland Kitchen predniSONE (DELTASONE) 20 MG tablet Take 1 tablet two times daily x 4 days, 1 tablet daily x 4 days, 1/2 tablet daily x 4 days, 1/2 tablet every other day until gone (Patient not taking: Reported on 02/15/2014)  . [DISCONTINUED] budesonide (PULMICORT) 0.5 MG/2ML  nebulizer solution Take 2 mLs (0.5 mg total) by nebulization 2 (two) times daily. (Patient not taking: Reported on 02/15/2014)    Allergies  Allergen Reactions  . Penicillins     REACTION: nausea and vomiting    Current Medications, Allergies, Past Medical History, Past Surgical History, Family History, and Social History were reviewed in [employer] record.   Review of Systems         See HPI - all other systems neg except as noted... The patient complains of dyspnea on exertion and difficulty walking; chest discomfort, & leg weakness.  The patient denies anorexia, fever, weight loss, weight gain, vision loss, decreased hearing, hoarseness, syncope, prolonged cough, headaches, hemoptysis, abdominal pain, melena, hematochezia,  severe indigestion/heartburn, hematuria, incontinence, suspicious skin lesions, transient blindness, depression, unusual weight change, abnormal bleeding, enlarged lymph nodes, and angioedema.    Objective:   Physical Exam    WD, Morbidly Obese, 63 y/o WF in acute distress fro dyspnea/ wheezing... GENERAL:  Alert & oriented; pleasant & cooperative... HEENT:  [place]/AT, EOM-wnl, PERRLA, EACs-clear, TMs-wnl, NOSE-clear, THROAT-clear & wnl. NECK:  Supple w/ fairROM; no JVD; normal carotid impulses w/o bruits; no thyromegaly or nodules palpated; no lymphadenopathy. CHEST:  no accessory musc use, decr BS bilat, no wheezing/ rales/ rhonchi... HEART:  Regular Rhythm; without murmurs/ rubs/ or gallops heard... ABDOMEN:  Obese, soft & nontender; normal bowel sounds; no organomegaly or masses detected. EXT: without deformities, mod arthritic changes; no varicose veins/ +venous insuffic/trace edema. NEURO:  CNs intact; no focal neuro deficits... DERM: few ecchymoses, no rash etc...  RADIOLOGY DATA:  Reviewed in the EPIC EMR & discussed w/ the patient...  LABORATORY DATA:  Reviewed in the EPIC EMR & discussed w/ the patient...   Assessment & Plan:     OSA>  As prev noted she needs new sleep study & appropriate new CPAP apparatus to facilitate compliance but she declines the split night study...  COPD/ Emphysema>  Acute exac 9/15 (likely viral URI) improved w/ Depo/Pred taper, NEBS Bid, Spiriva, Mucinex etc... Encouraged to stay on her O2 regularly, continue NEBS/ Spiriva regularly & use the Mucinex; needs to lose wt & incr exercise => she went back on Advair500-2spBid & Spiriva...  Hx CP w/ neg cardiac evals including several Myoviews; we wanted to check 2DEcho to screen for [facility] (she declined). 4/15> she had Cardiology consult for eval of AtypCP [name]); EKG was WNL & 2DEcho showed norm LVF, Gr1DD & norm RV & PA pressures...  OBESITY>  She had remote lap band surg by DrMMartin; Met syndrome, must get wt down etc; she is a lap band failure & prev counseling by [name]...  ? Hx Hypothy>  TFTs remain normal off meds- no problem...  GI>  GERD> she denies symptoms & uses OTC meds prn...  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  Dysthymia>  Much stress in her life, on Alpraz prn; has embraced alt lifestyle...   Patient's Medications  New Prescriptions   No medications on file  Previous Medications   ALBUTEROL (PROAIR HFA) 108 (90 BASE) MCG/ACT INHALER    Inhale 2 puffs into the lungs every 6 (six) hours as needed.   ALPRAZOLAM (XANAX) 0.5 MG TABLET    TAKE 1/2 TO 1 TABLET BY MOUTH 3 TIMES DAILY AS NEEDED FOR NERVES. NOT TO EXCEED 3 PER DAY   B COMPLEX-C-FOLIC ACID (B-COMPLEX BALANCED) TABS    Take 1 tablet by mouth daily.   BEE POLLEN 1000 MG TABS    Take 2 tablets by mouth daily.   CETIRIZINE (ZYRTEC) 10 MG TABLET    Take 10 mg by mouth daily.     CHOLECALCIFEROL (VITAMIN D-3) 5000 UNITS TABS    Take 1 tablet by mouth daily.   COCONUT OIL 1000 MG CAPS    Take 4 capsules by mouth daily.   COENZYME Q10 (COQ10) 100 MG CAPS    Take 1 tablet by mouth daily.   FUROSEMIDE (LASIX) 40 MG TABLET     TAKE 1 TO 2 TABS BY MOUTH ONCE DAILY AS NEEDED FOR SWELLING   KRILL OIL 500 MG CAPS    Take 1 capsule by mouth daily.   LEVALBUTEROL (  XOPENEX) 1.25 MG/3ML NEBULIZER SOLUTION    Take 1.25 mg by nebulization 2 (two) times daily.   MAGNESIUM 400 MG CAPS    Take 2 capsules by mouth daily.   NAPROXEN SODIUM (ANAPROX) 220 MG TABLET    Take 2 tabs by mouth once daily    PREDNISONE (DELTASONE) 20 MG TABLET    Take 1 tablet two times daily x 4 days, 1 tablet daily x 4 days, 1/2 tablet daily x 4 days, 1/2 tablet every other day until gone   SERTRALINE (ZOLOFT) 100 MG TABLET    Take 1/2 tablet by mouth daily   TRAMADOL (ULTRAM) 50 MG TABLET    TAKE 1 TABLET BY MOUTH  3 TIMES DAILY   AS NEEDED FOR PAIN   VITAMIN C (ASCORBIC ACID) 250 MG TABLET    Take 250 mg by mouth daily.  Modified Medications   Modified Medication Previous Medication   FLUTICASONE-SALMETEROL (ADVAIR) 500-50 MCG/DOSE AEPB Fluticasone-Salmeterol (ADVAIR) 500-50 MCG/DOSE AEPB      Inhale 1 puff into the lungs 2 (two) times daily.    Inhale 1 puff into the lungs 2 (two) times daily.   TIOTROPIUM (SPIRIVA HANDIHALER) 18 MCG INHALATION CAPSULE SPIRIVA HANDIHALER 18 MCG inhalation capsule      PLACE 1 CAPSULE (18 MCG TOTAL) INTO INHALER AND INHALE DAILY.    PLACE 1 CAPSULE (18 MCG TOTAL) INTO INHALER AND INHALE DAILY.  Discontinued Medications   BUDESONIDE (PULMICORT) 0.5 MG/2ML NEBULIZER SOLUTION    Take 2 mLs (0.5 mg total) by nebulization 2 (two) times daily.

## 2014-03-09 ENCOUNTER — Ambulatory Visit (INDEPENDENT_AMBULATORY_CARE_PROVIDER_SITE_OTHER)
Admission: RE | Admit: 2014-03-09 | Discharge: 2014-03-09 | Disposition: A | Discharge status: Home / Self Care | Payer: Medicare Other | Source: Ambulatory Visit | Attending: Family Medicine | Admitting: Family Medicine

## 2014-03-09 ENCOUNTER — Ambulatory Visit (INDEPENDENT_AMBULATORY_CARE_PROVIDER_SITE_OTHER): Payer: Medicare Other | Admitting: Family Medicine

## 2014-03-09 ENCOUNTER — Other Ambulatory Visit (INDEPENDENT_AMBULATORY_CARE_PROVIDER_SITE_OTHER): Payer: Medicare Other

## 2014-03-09 ENCOUNTER — Encounter: Payer: Self-pay | Admitting: Family Medicine

## 2014-03-09 VITALS — BP 102/68 | HR 98 | Ht 66.0 in | Wt 262.0 lb

## 2014-03-09 DIAGNOSIS — M47812 Spondylosis without myelopathy or radiculopathy, cervical region: Secondary | ICD-10-CM | POA: Diagnosis not present

## 2014-03-09 DIAGNOSIS — G8929 Other chronic pain: Secondary | ICD-10-CM | POA: Diagnosis not present

## 2014-03-09 DIAGNOSIS — M79602 Pain in left arm: Secondary | ICD-10-CM | POA: Diagnosis not present

## 2014-03-09 DIAGNOSIS — M19012 Primary osteoarthritis, left shoulder: Secondary | ICD-10-CM | POA: Diagnosis not present

## 2014-03-09 DIAGNOSIS — M25512 Pain in left shoulder: Secondary | ICD-10-CM | POA: Diagnosis not present

## 2014-03-09 DIAGNOSIS — M542 Cervicalgia: Secondary | ICD-10-CM | POA: Diagnosis not present

## 2014-03-09 DIAGNOSIS — M5032 Other cervical disc degeneration, mid-cervical region: Secondary | ICD-10-CM | POA: Diagnosis not present

## 2014-03-09 DIAGNOSIS — M7522 Bicipital tendinitis, left shoulder: Secondary | ICD-10-CM

## 2014-03-09 NOTE — Progress Notes (Signed)
[name] [address] [place], [place] [address] Phone: [phone] Subjective:    I'm seeing this patient by the request  of:  NADEL,SCOTT M, MD   CC: Multiple complaints  W[facility]:ENIDPOEUMP Rebecca [name] is a 64 y.o. female coming in with multiple complaints. Patient has significant comorbidities including COPD, obesity, metabolic syndrome including diabetes, history of uterine cancer and vitamin D deficiency. Patient complains of pain overall. Most severe pain seems to be the left arm. Patient states this seems to be mostly in her wrist and then seems to radiate up her arm towards her elbow as well as sometimes toward her shoulder. Patient states that this is non-debilitating but when she is doing certain activities she has to wear a wrist brace. Patient denies any true injury but has had multiple different muscular complaints over the years. Patient has been trying over-the-counter natural supplementations with some good resolution of pain. Patient states that this pain can affect her daily activities. Most of the time though she is able to do well. Denies any weakness that is associated with it. States that it can be tingling but would not call it and numbness. Denies that there is any association with any medications and has not notice any relationship with food or certain activities. Patient also complains of leg cramping midline at night. Patient states that this is apparently a couple weeks in duration. It occurs intermittently. Usually has to get out of bed the cramping to resolve. Somewhat better when she started magnesium.  Patient does have some chronic neck pain. States that it has gotten better since she's tender new contour pillow. States that it is not debilitating but frustrating. Once notices associated with her left arm pain.     Past medical history, social, surgical and family history all reviewed in electronic medical record.   Review of  Systems: No headache, visual changes, nausea, vomiting, diarrhea, constipation, dizziness, abdominal pain, skin rash, fevers, chills, night sweats, weight loss, swollen lymph nodes, body aches, joint swelling, muscle aches, chest pain, shortness of breath, mood changes.   Objective Blood pressure 102/68, pulse 98, height 5\' 6"  (1.676 m), weight 262 lb (118.842 kg), SpO2 85 %.  General: No apparent distress alert and oriented x3 mood and affect normal, dressed appropriately.  HEENT: Pupils equal, extraocular movements intact  Respiratory: Patient's speak in full sentences and does not appear short of breath  Cardiovascular: No lower extremity edema, non tender, no erythema  Skin: Warm dry intact with no signs of infection or rash on extremities or on axial skeleton.  Abdomen: Soft nontender  Neuro: Cranial nerves II through XII are intact, neurovascularly intact in all extremities with 2+ DTRs and 2+ pulses.  Lymph: No lymphadenopathy of posterior or anterior cervical chain or axillae bilaterally.  Gait normal with good balance and coordination.  MSK:  Non tender with full range of motion and good stability and symmetric strength and tone of shoulders, elbows, wrist, hip, knee and ankles bilaterally. Mild osteophytic changes of multiple joints. Wrist: Left Inspection normal with no visible erythema or swelling. ROM has mild limitations in all planes but patient does have mild crepitus as well. If use mild tenderness to palpation over metacarpals, navicular, lunate, and TFCC; tendons without tenderness/ swelling No snuffbox tenderness. No tenderness over Canal of [place]. Strength 5/5 in all directions without pain. Negative Finkelstein, tinel's and phalens. Negative Watson's test. Contralateral wrist has some mild pain as well.  Shoulder: Left Inspection reveals  no abnormalities, atrophy or asymmetry. Palpation is normal with no tenderness over AC joint or bicipital groove. ROM is full in  all planes. Rotator cuff strength normal throughout. Mild signs of impingement with positive Neer and Hawkins Speeds and Yergason's tests are positive No labral pathology noted with negative Obrien's, negative clunk and good stability. Normal scapular function observed. No painful arc and no drop arm sign. No apprehension sign  Neck: Inspection unremarkable. No palpable stepoffs. Negative Spurling's maneuver. Full neck range of motion Grip strength and sensation normal in bilateral hands Strength good C4 to T1 distribution No sensory change to C4 to T1 Negative Hoffman sign bilaterally Reflexes normal  Contralateral shoulder has mild impingement but no speeds with full range of motion and strength.  X-rays were ordered reviewed and interpreted by me today. Patient did have mild degenerative changes of the left acromial joint. Glenohumeral joint is fairly unremarkable. Cervical neck x-rays do show that patient has moderate degenerative changes mostly with foraminal narrowing at C5-C6.  MSK US performed of: Left shoulder and arm This study was ordered, performed, and interpreted by Charlann Boxer D.O.  Shoulder:   Supraspinatus:  Appears normal with some degenerative changes on long and transverse views, no bursal bulge seen with shoulder abduction on impingement view. Infraspinatus:  Appears normal on long and transverse views. Subscapularis:  Appears normal with some degenerative changes on long and transverse views. Teres Minor:  Appears normal on long and transverse views. AC joint:  Capsule undistended, no geyser sign. Moderate arthritis Glenohumeral Joint:  Appears normal without effusion. Glenoid Labrum:  Intact without visualized tears. Biceps Tendon:  Bicep tendon doesn't hypoechoic changes in patient does have a shallow intertubercular groove No increased power doppler signal.  Wrist ultrasound shows the patient does have some deposition of material within certain joints.  This can go along with inflammatory arthritis or crystal arthropathy.  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    Impression and Recommendations:     This case required medical decision making of moderate complexity.

## 2014-03-09 NOTE — Patient Instructions (Addendum)
Good to see you I think your pillow is great.  For your leg cramps, iron  Iron 325mg  3 times a week.  Tart cherry extract at night.  Look up food for gout. We may consider checking uric acid in the past.  Exercises 3 times a week. Alternate the shoulder and the neck.  Consider the posture shirt as well.  2 xrays today.  I love all the vitamins.  Turmeric 500mg  twice daily Glucosamine 1500mg  daily  Tylenol 650 mg 3 times a day is the best for arthritis.  If you can find saffron 300mg  daily.  Capsin topically.  [name] does well.  See me again in 4 week.

## 2014-03-10 DIAGNOSIS — M542 Cervicalgia: Secondary | ICD-10-CM | POA: Insufficient documentation

## 2014-03-10 DIAGNOSIS — G8929 Other chronic pain: Secondary | ICD-10-CM

## 2014-03-10 DIAGNOSIS — M7522 Bicipital tendinitis, left shoulder: Secondary | ICD-10-CM | POA: Insufficient documentation

## 2014-03-10 DIAGNOSIS — M79602 Pain in left arm: Secondary | ICD-10-CM | POA: Insufficient documentation

## 2014-03-10 NOTE — Assessment & Plan Note (Signed)
Patient does have some mild biceps tendinitis and we discussed a compression sleeve that patient can get over-the-counter, icing protocol and home exercises. I think this is mild overall and will continue to monitor. Patient likely will improve quickly.

## 2014-03-10 NOTE — Assessment & Plan Note (Signed)
Once again secondary to muscle imbalances and poor core strength. Patient does have some moderate degenerative changes mostly of C5-C6 but patient does not have any specific neuropathy that corresponds to this level. Discussed with patient about home exercises and was given a handout. We discussed proper sleeping position that could be beneficial. Discussed a cryotherapy. Patient will try to make these changes and come back in 4 weeks

## 2014-03-10 NOTE — Assessment & Plan Note (Signed)
Patient's left arm pain is not worse with exacerbation and I do not think it is cardiovascular in nature. Do think it is musculoskeletal with significant muscle imbalances. Patient on ultrasound today of the wrist and shoulder do not show any drastic pathology but there is a possibility for a crystal arthropathy. X-rays were ordered and did show some mild degenerative changes but fairly unremarkable. Discussed with patient who would like to avoid prescription medications balance the medications are over-the-counter. We discussed about diet and exercise and how this can be beneficial. Patient given home exercise program and we talked about compression as well as the possibility of increasing strength over the course of time. Patient is to make some changes to her diet as well as activity and we will see if we make some improvement. Discussed with patient I would like to avoid prednisone due to her COPD but if she continues to have any exacerbations for her to be aware of her joints take some she is on prednisone and see if this will be beneficial.  Patient continues to have difficulty with pain I would like to do further workup with labs including vitamin D level, thyroid panel, iron panel, as well as uric acid level. We'll discuss this further at follow-up in 4 weeks.

## 2014-04-23 ENCOUNTER — Telehealth: Payer: Self-pay | Admitting: Pulmonary Disease

## 2014-04-23 DIAGNOSIS — J9611 Chronic respiratory failure with hypoxia: Secondary | ICD-10-CM

## 2014-04-23 DIAGNOSIS — J45901 Unspecified asthma with (acute) exacerbation: Secondary | ICD-10-CM

## 2014-04-23 DIAGNOSIS — J449 Chronic obstructive pulmonary disease, unspecified: Secondary | ICD-10-CM

## 2014-04-23 NOTE — Telephone Encounter (Signed)
Per SN:  Offer Zpak Mucinex 600mg , 1po QID with fluids ------ Pt refused all treatment above, states that she is already taking the equivalent of the Mucinex 600mg  QID plus additional meds.  Robitussin Cold and Chest 800mg  daily Mucinex 400mg  3 AM and 3 PM Increased fluid intake - 1 gallon or more a day of H2O Using albuterol prn Increased Lasix to 80mg  daily Pt states that she cannot breath and is unable to walk to bathroom without giving out. Pulse Ox is registering 69-70% at rest.  Using O2 E-tank with face mask at 8L/mins Pt states she feels like she is "dying".  I advised the patient that with symptoms given and O2 sats (if pulse ox correct) that she really needs to go to ED to be evaluated. Pt states that she wants rec's from SN.   Please advise. Thanks.

## 2014-04-23 NOTE — Telephone Encounter (Signed)
Called pt back and spoke with her. Pt was very angry on the phone.  She stated that she cannot afford to live and with her severe lung disease.  She refused to go to the ER for further eval today since she would be exposed to all of those germs and this would make her worse.  She stated that she will find a doctor that wants to take care of her.    I advised her that we were not telling her that we would not see her earlier, but with her sats at that number, she would have needed to be seen ASAP.  She stated that she did not want to make an appt.  Pt was appreciative that we did call back to check on her.

## 2014-04-23 NOTE — Telephone Encounter (Signed)
Pt c/o increased sob, states her lungs and head are "full of something", prod cough with clear mucus X3 days.  Is requesting recs.  [facility]ses [name] [address] in [place].  Last ov:02/15/14 Next ov: 05/08/14  Dr. Lenna Gilford please advise.  Thank you!  Allergies  Allergen Reactions  . Penicillins     REACTION: nausea and vomiting   Current Outpatient Prescriptions on File Prior to Visit  Medication Sig Dispense Refill  . albuterol (PROAIR HFA) 108 (90 BASE) MCG/ACT inhaler Inhale 2 puffs into the lungs every 6 (six) hours as needed. 1 Inhaler 6  . ALPRAZolam (XANAX) 0.5 MG tablet TAKE 1/2 TO 1 TABLET BY MOUTH 3 TIMES DAILY AS NEEDED FOR NERVES. NOT TO EXCEED 3 PER DAY 90 tablet 5  . B Complex-C-Folic Acid (B-COMPLEX BALANCED) TABS Take 1 tablet by mouth daily.    Raelyn Ensign Pollen 1000 MG TABS Take 2 tablets by mouth daily.    . cetirizine (ZYRTEC) 10 MG tablet Take 10 mg by mouth daily.      . Cholecalciferol (VITAMIN D-3) 5000 UNITS TABS Take 1 tablet by mouth daily.    . Coconut Oil 1000 MG CAPS Take 4 capsules by mouth daily.    . Coenzyme Q10 (COQ10) 100 MG CAPS Take 1 tablet by mouth daily.    . Fluticasone-Salmeterol (ADVAIR) 500-50 MCG/DOSE AEPB Inhale 1 puff into the lungs 2 (two) times daily. 2 each 0  . furosemide (LASIX) 40 MG tablet TAKE 1 TO 2 TABS BY MOUTH ONCE DAILY AS NEEDED FOR SWELLING 60 tablet 4  . Krill Oil 500 MG CAPS Take 1 capsule by mouth daily.    Marland Kitchen levalbuterol (XOPENEX) 1.25 MG/3ML nebulizer solution Take 1.25 mg by nebulization 2 (two) times daily. 180 mL 6  . Magnesium 400 MG CAPS Take 2 capsules by mouth daily.    . naproxen sodium (ANAPROX) 220 MG tablet Take 2 tabs by mouth once daily     . predniSONE (DELTASONE) 20 MG tablet Take 1 tablet two times daily x 4 days, 1 tablet daily x 4 days, 1/2 tablet daily x 4 days, 1/2 tablet every other day until gone 20 tablet 0  . sertraline (ZOLOFT) 100 MG tablet Take 1/2 tablet by mouth daily 30 tablet 1  . tiotropium  (SPIRIVA HANDIHALER) 18 MCG inhalation capsule PLACE 1 CAPSULE (18 MCG TOTAL) INTO INHALER AND INHALE DAILY. 20 capsule 0  . traMADol (ULTRAM) 50 MG tablet TAKE 1 TABLET BY MOUTH  3 TIMES DAILY   AS NEEDED FOR PAIN 90 tablet 1  . vitamin C (ASCORBIC ACID) 250 MG tablet Take 250 mg by mouth daily.     No current facility-administered medications on file prior to visit.

## 2014-04-23 NOTE — Telephone Encounter (Signed)
Pt calling again [facility] did not deliver O2 Advised her to go to ER if satn low She rechecked 83% - offered OV for tomorrow, to ER if worse She was tearful & frustrated -does not want [place] to ER due to 'germs'

## 2014-04-23 NOTE — Telephone Encounter (Signed)
Per SN, pt needs to go to ED immediately.  Pt states that she does not have anyone to care for her dog and she does not want to leave her by herself unattended. Advised pt that the best thing that she could do at this point is make sure that she herself is taken care of. Pt very emotional on the phone and upset about the fact of going into the hospital and leaving her dog. I asked her if a neighbor could help out and feed the dog and let it out to potty, pt states that she has a Doberman and everyone is terrified of her. I advised again the best thing for her to do is get the dog settled and go to ED. She can make arrangements and call people while in hospital.  Pt did not seem like she was going to go to ED from the way she was talking. Will send to SN as FYI.

## 2014-04-23 NOTE — Telephone Encounter (Signed)
Spoke with Tisa at [facility], states that they told pt she could pick up 02 but they could not drop off 02 until Thursday and that they would need an order from [place] for a mask as the patient is requesting a mask since she cannot use her nasal cannula since her nose is stuffed up.  Also states that pt is using 8lpm and they cannot send in an order for 8 lpm without an order from our office.    atc pt on home phone, no vm.  Will hold in triage to follow up on tomorrow as pt has been instructed twice to go to ED today and no providers are here to ok an order at this time.

## 2014-04-24 DIAGNOSIS — J441 Chronic obstructive pulmonary disease with (acute) exacerbation: Secondary | ICD-10-CM | POA: Diagnosis present

## 2014-04-24 DIAGNOSIS — Z9119 Patient's noncompliance with other medical treatment and regimen: Secondary | ICD-10-CM | POA: Diagnosis present

## 2014-04-24 DIAGNOSIS — I872 Venous insufficiency (chronic) (peripheral): Secondary | ICD-10-CM | POA: Diagnosis present

## 2014-04-24 DIAGNOSIS — J9621 Acute and chronic respiratory failure with hypoxia: Secondary | ICD-10-CM | POA: Diagnosis not present

## 2014-04-24 DIAGNOSIS — Z9981 Dependence on supplemental oxygen: Secondary | ICD-10-CM | POA: Diagnosis not present

## 2014-04-24 DIAGNOSIS — Z79899 Other long term (current) drug therapy: Secondary | ICD-10-CM | POA: Diagnosis not present

## 2014-04-24 DIAGNOSIS — Z9114 Patient's other noncompliance with medication regimen: Secondary | ICD-10-CM | POA: Diagnosis not present

## 2014-04-24 DIAGNOSIS — Z9884 Bariatric surgery status: Secondary | ICD-10-CM | POA: Diagnosis not present

## 2014-04-24 DIAGNOSIS — E559 Vitamin D deficiency, unspecified: Secondary | ICD-10-CM | POA: Diagnosis present

## 2014-04-24 DIAGNOSIS — M797 Fibromyalgia: Secondary | ICD-10-CM | POA: Diagnosis present

## 2014-04-24 DIAGNOSIS — I5030 Unspecified diastolic (congestive) heart failure: Secondary | ICD-10-CM | POA: Diagnosis not present

## 2014-04-24 DIAGNOSIS — Z90710 Acquired absence of both cervix and uterus: Secondary | ICD-10-CM | POA: Diagnosis not present

## 2014-04-24 DIAGNOSIS — J449 Chronic obstructive pulmonary disease, unspecified: Secondary | ICD-10-CM | POA: Diagnosis not present

## 2014-04-24 DIAGNOSIS — M1990 Unspecified osteoarthritis, unspecified site: Secondary | ICD-10-CM | POA: Diagnosis present

## 2014-04-24 DIAGNOSIS — E7439 Other disorders of intestinal carbohydrate absorption: Secondary | ICD-10-CM | POA: Diagnosis present

## 2014-04-24 DIAGNOSIS — Z7951 Long term (current) use of inhaled steroids: Secondary | ICD-10-CM | POA: Diagnosis not present

## 2014-04-24 DIAGNOSIS — K219 Gastro-esophageal reflux disease without esophagitis: Secondary | ICD-10-CM | POA: Diagnosis present

## 2014-04-24 DIAGNOSIS — Z8542 Personal history of malignant neoplasm of other parts of uterus: Secondary | ICD-10-CM | POA: Diagnosis not present

## 2014-04-24 DIAGNOSIS — E8881 Metabolic syndrome: Secondary | ICD-10-CM | POA: Diagnosis not present

## 2014-04-24 DIAGNOSIS — Z6841 Body Mass Index (BMI) 40.0 and over, adult: Secondary | ICD-10-CM | POA: Diagnosis not present

## 2014-04-24 DIAGNOSIS — G4733 Obstructive sleep apnea (adult) (pediatric): Secondary | ICD-10-CM | POA: Diagnosis present

## 2014-04-24 DIAGNOSIS — R0602 Shortness of breath: Secondary | ICD-10-CM | POA: Diagnosis not present

## 2014-04-24 NOTE — Telephone Encounter (Signed)
Pt returned call - reports [facility] has let her run out of her O2 because they didn't have an order from SN She states she will call EMS Advised pt that we did speak with [facility] yesterday and they have O2 for her but are unable to deliver until 2/25.  Pt stated that the order is for liquid O2 and this is not what she needs.  Requests to have mask rather than cannula when she returns home and E-tanks (pt states will need at least 6L in order to support mask). Pt stated that her O2 is at 59% Advised pt to call EMS immediately - pt voiced her understanding    Discussed with SN, he is aware

## 2014-04-24 NOTE — Telephone Encounter (Signed)
atc pt, no answer, no vm.

## 2014-04-24 NOTE — Telephone Encounter (Signed)
Order to [facility] for full face mask, as okayed by SN  Pt still has not gone to the ED.  Unable to tell if she called EMS.  Will sign off

## 2014-04-25 ENCOUNTER — Telehealth: Payer: Self-pay | Admitting: Pulmonary Disease

## 2014-04-25 NOTE — Telephone Encounter (Signed)
Error wrong dr.Stanley A [name] ° ° °

## 2014-04-25 NOTE — Telephone Encounter (Signed)
Attempted to call the pt but the line is busy.  Will try back later.  

## 2014-04-26 NOTE — Telephone Encounter (Signed)
ATC to mobile phone but line was busy, unable to leave VM. ATC home phone, line kept ringing without VM. WCB.

## 2014-04-26 NOTE — Telephone Encounter (Signed)
Called mobile # line busy x 2  Called other # listed and the mailbox was full, unable to leave msg

## 2014-04-27 NOTE — Telephone Encounter (Signed)
atc pt on both humbers, lines rang with no option to leave vm.  Will close message per triage protocol.

## 2014-04-30 ENCOUNTER — Telehealth: Payer: Self-pay | Admitting: Pulmonary Disease

## 2014-04-30 NOTE — Telephone Encounter (Signed)
Attempted to call pt. Line was busy.

## 2014-04-30 NOTE — Telephone Encounter (Signed)
Attempted to contact patient, line rings busy x 2

## 2014-05-01 NOTE — Telephone Encounter (Signed)
Pt calling, very rude and yelling on the phone. Front staff called back into triage stating that she has been yelling at them, calling them stupid and other obscene comments. I took call and was able to calm the patient. States that she was seen in hospital in [place] 2/22 and was admitted for a few days. Pt was D/C home on Friday 2/25 with Levaquin 500mg  and Prednisone 10mg . Pt currently taking Pred 10mg  TID. Has not taken but one dose of Levaquin 500mg  d/t is making her very sick. Pt c/o severe nausea; denies vomiting with this medication and decreased appetite. Asking for alternative. C/o mucus production, coughing up a lot of clear mucus  And having SOB worse than normal. Denies chest pain, chest tightness and fever.   Pt states that she wants to die, states that she has a DNR now, given to her in hospital and she feels that she just wants to die. Went on stating that she feels like a burden to our office and that no one cares about her well being. States that we proved this to her because we never tried to call her back. I advised the patient that we have indeed attempted to call her numerous times since she initially left the message, 3 times on 2/29 and 1 time this morning; all times received a busy ring back tone. Pt started yelling stating that we have not tried to call her and that she has never been treated this bad by anyone in her life. Again I advised her that we did try to call and was unable to get through because line was busy - pt states that we have to dial area code [phone] with every call to her, I advised her that I am unsure if the nurses did that when they called her.  Pt started yelling again and stated that in all the years of her working with [employer] she has never been as incompetent as I am being in this very moment. I asked patient to lower her voice and to not call me incompetent as I am trying to help her.  Pt scheduled for OV with SN 05/02/14 at 12pm  Will discuss with SN

## 2014-05-01 NOTE — Telephone Encounter (Signed)
Attempted to call pt. Line was still busy. Will try back.

## 2014-05-02 ENCOUNTER — Encounter: Payer: Self-pay | Admitting: Pulmonary Disease

## 2014-05-02 ENCOUNTER — Ambulatory Visit (INDEPENDENT_AMBULATORY_CARE_PROVIDER_SITE_OTHER): Payer: Medicare Other | Admitting: Pulmonary Disease

## 2014-05-02 VITALS — BP 140/80 | HR 100 | Temp 97.6°F | Ht 66.0 in | Wt 255.1 lb

## 2014-05-02 DIAGNOSIS — G4733 Obstructive sleep apnea (adult) (pediatric): Secondary | ICD-10-CM

## 2014-05-02 DIAGNOSIS — R7302 Impaired glucose tolerance (oral): Secondary | ICD-10-CM

## 2014-05-02 DIAGNOSIS — M150 Primary generalized (osteo)arthritis: Secondary | ICD-10-CM | POA: Diagnosis not present

## 2014-05-02 DIAGNOSIS — E039 Hypothyroidism, unspecified: Secondary | ICD-10-CM | POA: Diagnosis not present

## 2014-05-02 DIAGNOSIS — M159 Polyosteoarthritis, unspecified: Secondary | ICD-10-CM

## 2014-05-02 DIAGNOSIS — J449 Chronic obstructive pulmonary disease, unspecified: Secondary | ICD-10-CM

## 2014-05-02 DIAGNOSIS — J9611 Chronic respiratory failure with hypoxia: Secondary | ICD-10-CM | POA: Diagnosis not present

## 2014-05-02 DIAGNOSIS — F341 Dysthymic disorder: Secondary | ICD-10-CM

## 2014-05-02 DIAGNOSIS — I872 Venous insufficiency (chronic) (peripheral): Secondary | ICD-10-CM | POA: Diagnosis not present

## 2014-05-02 MED ORDER — PREDNISONE 20 MG PO TABS: 20.0000 mg | ORAL_TABLET | Freq: Every day | ORAL | Status: DC

## 2014-05-02 NOTE — Patient Instructions (Signed)
Today we updated your med list in our EPIC system...    Continue your current medications the same...  We outlined a regular program of NEBS w/ XOPENEX three times daily...    And intersperse the [place] between these treatments and at bedtime...  Keep the Prednisone at 20mg  once daily in the AM for now...  Continue the MUCINEX, fluids, Delsym, cough syrup, and TRAMADOL as discussed...  Continue the ALPRAZOLAM 0.5mg - 1/2 tab three times daily...  We will contact [facility] regarding pulse dose conserving devices for your oxygen tanks and humidification...  Finally we discussed calling in [facility] to help with your symptom management & home care...  Let me know if you have any questions... And let's plan a follow up visit in 3-4 weeks.Marland KitchenMarland Kitchen

## 2014-05-02 NOTE — Progress Notes (Signed)
HPI  Review of Systems  Physical Exam  Patient ID: Rebecca [name], female   DOB: [birth year], 64 y.o.   MRN: [id number]  Subjective:    Patient ID: Rebecca [name], female    DOB: 1951-01-19, 64 y.o.   MRN: [id number]  HPI 64 y/o WF known to me w/ mult med problems as noted below...  ~  SEE PREV EPIC NOTES FOR OLDER DATA >>   ~  November 04, 2012:  51moROV & [name] has lost 8# down to 281# today but she continues w/ numerous complaints, nothing is [name] she says, see below; We reviewed the following medical problems during today's office visit >>     Hx mild OSA w/ mod desat, loud snoring, & leg jerks (on sleep study 2005)> she tried CPAP10 but stopped on her own & declined to repeat split night study & re-try CPAP rx...    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 1993; supposed to be on HomeO2- 2L rest & 4L exercise, NEBS w/ Albut, Advair500Bid, Spiriva daily, Mucinex1-2Bid but not taking anything regularly; states that Flutter & Mucinex cause "spasms" in her back; states O2 doesn't help since she can't breathe thru her nose (she has been eval by ENT, CErnesto Rutherford; finally tried [facility] stopped after 228mos "fruitless"; states she "can't breathe at all, can't do anything" etc; SOB w/ ADLs=> O2 recert today (see below)... Note> [facility] and last PFT (2011) showed FEV1= 1.5 (55%) & %1sec=59...    Hx AtypCP> baseline EKG w/ NSSTTWA; prev 2DEcho w/ norm LVF, EF=50-55%, mild MR/TR, norm LA/RV; Myoview 2009 normal w/o ischemia & EF=55%...    Ven Insuffic> on Lasix40-2Qam; she knows not to eat sodium, elev legs, wear support hose...    Impaired Gluc Tolerance, Metabolic syndrome> she refused meds; prev eval by DrEllison w/ rec for Metformin Rx but she rejects the DM diagnosis & refused meds; states "I'm eating healthier" (advised low carb low fat), not checking sugars, prev labs w/ A1c=7.1    ?Hypothyroid> not currently on meds but she really wants thyroid Rx; prev TFTs all wnl;  she prev had Endocrine in [place] prescribe Synthroid for her but she stopped going; clinically & biochemically euthyroid...    Morbid Obesity> peak weight ~340# before lap band surg 2009 by DrMMartin; lost to ~250# but then back up to 280-90#; refused f/u w/ [facility] due to cost of visits & lap-band adjustments; we reviewed diet/ exercise...    GI- GERD, IBS> she uses OTC PPI as needed; last colonoscopy was 1982 by DrPatterson & wnl, she refuses GI referral & refuses f/u colon etc...    GYN- s/p uterine cancer> [facility].    DJD, ?FM, VitD defic> on Vicodin, Tramadol, Aleve, VitD, etc; prev eval by DrDeveshwar for rheum; now c/o pain in left knee (she thinks it's "siatica"), can't exercise & wants to see [facility]- ok...    Anxiety/ Depression> on Alprazolam 0.32m66mid prn; prev eval by Psychiatry w/ seasonal affective disorder; still sees Psychology [name] re: eating disorder; under considerable stress due to relationship w/ sister, financial pressure, etc... We reviewed prob list, meds, xrays and labs> see below for updates >> she take numerous herbs, supplements, etc- often rec to her by her alternative lifestyle acquaintances...      OXYGEN RE-CERTIFICATION:   9/59/3/23 1.  The pt has a continued need for home oxygen therapy due to her severe  COPD/ emphysema...    2.  She is using the oxygen in the home w/ exercise (4L/min), and at night (2L/min)...    3.  O2 sats at rest on RA today=> 92% w/ pulse rate 82/min...     4.  Pt exercised in office on RA => after 2laps O2 sat nadir= 86% w/ pulse rate=105...    5.  Pt placed on O2 at 4L/min via [place] & exercise repeated => after 2laps O2 sat nadir= 92% w/ pulse rate=102  ~  May 05, 2013:  22moROV & when GArtis Delaywas seen last (9/14) she was placed on O2 but she was miserable- "couldn't do anything"; she tells me she started doing her own research & found out that Magnesium is very important (helps her leg cramps) and you can  still be low w/ normal blood levels in fact everybody is deficient so she started taking a special easily absorbed magnesium supplement and within 4H of the 1st dose her breathing was 100% [name] & she feels that her breathing is cured, hasn't needed any oxygen (but wants to keep it just in case), and now she's walking, exercising, & she's lost 10#...  She also wanted to let me know about the benefits to [name] (horse linament) that she rubs into her knees and it works great, it's a great anti-inflamm & analgesic, doesn't need any Hydrocodone etc... Finally she wanted to let me know about 2 supplements that have been very helpful- "Clear Lungs" and "Lung tonic"...  We reviewed the following medical problems during today's office visit >>     Hx mild OSA w/ mod desat, loud snoring, & leg jerks (on sleep study 2005)> she tried CPAP10 but stopped on her own & declined to repeat split night study & re-try CPAP rx; she says she's sleeping satis & wakes feeling refreshed...    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 1993; supposed to be on HomeO2- 2L rest & 4L exercise, NEBS w/ Albut, Advair500Bid, Spiriva daily, Mucinex1-2Bid but not taking anything regularly; states that Flutter & Mucinex cause "spasms" in her back; states O2 doesn't help since she can't breathe thru her nose (she has been eval by ENT, CErnesto Rutherford; finally tried [facility]but stopped after 287mos "fruitless"; prev stated she "can't breathe at all, can't do anything" etc; SOB w/ ADLs etc; Note> [facility] and last PFT (2011) showed FEV1= 1.5 (55%) & %1sec=59 ==> 3/15 she announced that she's been cured by taking a Magnesium supplement, along w/ herbal formula "Clear lungs" and "Lung tonic"; she has stopped her Oxygen, still using Advair500 & Spiriva, using Mucinex 7 AlbutHFA just prn...     Hx AtypCP> baseline EKG w/ NSSTTWA; prev 2DEcho w/ norm LVF, EF=50-55%, mild MR/TR, norm LA/RV; Myoview 2009 normal w/o ischemia & EF=55%; she notes  some atypCP & requests f/u cardiac eval (prev seen by DrWall)...    Ven Insuffic> on Lasix40-2Qam; she knows not to eat sodium, elev legs, wear support hose; Labs 3/15 showed Cr=1.5 & she is asked to decr the Lasix to 4034mm...    Impaired Gluc Tolerance, Metabolic syndrome> she refused meds; prev eval by DrEllison w/ rec for Metformin Rx but she rejects the DM diagnosis & refused meds; states "I'm eating healthier" (advised low carb low fat); Labs 3/15 showed BS=95, A1c=6.8... Marland KitchenMarland Kitchen ?Hypothyroid> not currently on meds but she really wants thyroid Rx; prev TFTs all wnl; she prev had Endocrine in [place] prescribe Synthroid for  her but she stopped going; clinically & biochemically euthyroid...    Morbid Obesity> peak weight ~340# before lap band surg 2009 by DrMMartin; lost to ~250# but then back up to 280-90#; refused f/u w/ [facility] due to cost of visits & lap-band adjustments; 3/15 weight down to 270# & we reviewed diet & exercise...    GI- GERD, IBS> she uses OTC PPI as needed; last colonoscopy was 1982 by DrPatterson & wnl, she refuses GI referral & refuses f/u colon etc...    GYN- s/p uterine cancer> s/p lap hyst & BSO [facility]Sk[facility]...    DJD, ?FM, VitD defic> prev on Vicodin, Tramadol, Aleve, VitD; prev eval by DrDeveshwar for rheum; now she states that [name] ("horse linament") applied topically has worked like a miracle & she has increased exercise etc...    Anxiety/ Depression> on Alprazolam 0.27m prn; prev eval by Psychiatry w/ seasonal affective disorder; still sees Psychology [name] re: eating disorder; under considerable stress due to relationship w/ sister, financial pressure, etc... We reviewed prob list, meds, xrays and labs> see below for updates >> she refused the 2014 Flu vaccine... She has declined TDAP & Pneumovax as well...  CXR 3/15 showed underlying COPD, atelectasis in RML, DJD in TSpine, NAD; Rec to take OTC Mucinex600-2Bid w/ fluids...  LABS 3/15:  Chems- BS=95 A1c=6.8  (needs low carb diet, exercise, wt loss) & Creat=1.5 (mild renal insuffic due to Lasix rx; Rec to decrease Lasix40 to 1 tab Qam...   ADDENDUM>>  she had Cards eval DCherly Hensen4/15> he did not feel that invasive testing was warranted; she had 2DEcho but refused Myoview due to co-pay costs...   2DEcho 4/15 showed norm LV sys function w/ EF=60-65%, normal wall motion, Gr1DD, normal valves, normal RV & PA pressures...  ~  November 07, 2013:  644moOV & [name] reports "I'm doing great, just a little arthritis in my mouse hand"; she also relates a lot of grief over the loss of her 9y/o Min-Pin dog Brixx who ran away several mo ago;  She reports that she participated in a medical study out of [place] regarding spacers for folks w/ COPD using MDIs, no ch in her meds required;  She remains on ADVAIR500Bid, SPIRIVA daily, & ProairHFA prn (uses 3-4 x daily "It really helps");  She has Home O2 but dislikes the Liq O2 therefore not using & she wants a portable O2 concentrator to read 4L/min w/ exercise, 2L/min at rest;  She still doesn't like the nasal cannula, doesn't feel that it gets into her system but like the face mask attachment because she can feel it [name]- of course we discussed all these issues (again)...     She has continued to research her supplements and herbal meds>  She tells me that she is living on smoothies she makes out of juice, ice, baking soda ("only a tiny bit"), pomegranate syrup, & local honey;  Tumeric helps the arthritis in her knees and she has decreased the amt of aleve she was using;  She notes that pinching her upper lip in the middle under her nose helps relieve musc cramps in her legs;  She states that she hasn't had any palpit or skip beats since starting on a Magnesium supplement...    She still sees [name] for help w/ her eating disorder but was a bit upset that he didn't have a diet handout for her;  We gave her the papers we have here to go by... We reviewed prob  list, meds, xrays and  labs> see below for updates >> she declines the 2015 Flu vaccine today...  Ambulatory O2 Test 9/15>  O2 sat on RA at rest= 92% w/ pulse=86;  Nadir O2sat on RA after 2 laps= 84% w/ pulse=96...   ~  November 17, 2013:  Artis Delay presents for an urgent add-on appt (refused to go to the ER) w/ acute SOB; she called yest c/o chest tightness, cough, sm amt of whitish sput & wanted something to "dry me up" (given antihist)- she refused antibiotics and Prednisone;   drove herself to the office where she was found to be tachypneic w/ RR=24, hypoxemic on RA w/ O2sat=83%; she denies f/c/s, discolored sput, CP; chest exam w/ decr BS, exp wheezing & using accessory muscles; dry cough, no rales or consolidation...    RX> placed on O2 via mask w/ improvement in sats to 93%; given NEB treatment w/ Xopenex 0.63 x2; given Depo120 as well => improved w/ [name] air movement, decr wheezing, and O2sat up to 97%    REC> to change Advair500 to NEBS w/ ALBUT2.36m Qid (?Xopenex not covered)/ BUDES0.557mBid; Pred 2024m4d tapering sched, Mucinex 1200m56md & fluids... Continue Spiriva daily and home Oxygen... We reviewed prob list, meds, xrays and labs> see below for updates >> reminded of low carb, no sweets, get wt down...  CXR today showed COPD/emphysema, no acute abnormality...  LABS 9/15:  Chems- ok w/ BS=119, A1c=6.7, Cr=1.4;  CBC- ok...  ~  December 15, 2013:  57mo 27mo& [name] iArtis Delayuch improved on the Pred taper=> now down to 1/2 Qod but she wants off, feels worse on the day she takes it she says; advised slow taper to 1/2 Q3d til gone, and reminded to use the Xopenex/Budes NEB Bid regularly, and the Mucinex 1200mg 49mw/ Fluids...  She tells me that she has had some recent dental problems and inquired about a state sponsored dental clinic option but there were no openings unless her doctor wrote a letter indicating that her dental prob was contrib to her medical illness etc=> letter written & given to pt per request...  She has  been using the Xanax but feels it is too strong for her- even 1/2 tab at bedtime gives her a hangover & she is advised to further decr the dose until she finds the best combination of rest & no morning side effect... Plan ROV 64mo.  657moecember 17, 2015:  64mo ROV457moil is cArtis Delayleft arm pain- shoulder down to the wrist "it's a nerve" she says; discomfort present for 2 wks, can't sleep, "hurt so much I cried", tried OTC meds, horse linament, Tramadol, Hydrocodone & none w/ relief; using heating pad which helps some, she denies neck pain or decr ROM=> we discussed needed [facility]...     She has weaned off the Pred as noted above but she also stopped the Xopenex/ Budesonide via NEB because it was too expensive; she is back on her Advair500-2spBid & Spiriva daily; she has Albuterol2.5 for the Nebulizer to use prn; she continues to take Mucinex1200Bid + Fluids; she notes that her breathing is stable- DOE w/ ADLs and no real improvement, she has not been able to exercise, or diet, and weight is up to 274# w/ BMI~44;  She still desaturates w/ activity on RA but she refuses to wear the oxygen regularly as prescribed- states she uses it intermittently if SOB & take it off  5-120 min later when she recovers;  She has not yet had her dental work;  Zoloft seems to be helping her depression but she does not want to increase from 60m to 1028md;  She is still [facility] about her dog Brix who ran away earlier this yr...     We reviewed prob list, meds, xrays and labs> see below for updates >> she refuses the 2015 flu vaccine....   ~  May 02, 2014:  2-28m71moV & post hosp check>  [facility] w/ COPD exac ([place]);  Disch on Levaquin750, Pred taper, NEBS w/ Xopenex, and continued on her Advair500Bid, Spiriva daily, Mucinex, Lasix40 prn >>   2/16 [facility] records indicated a thorough  evaluation & excellent care provided but treatment plan was hampered by noncompliance- refused CPAP, declines chestPT, alternately stopping her O2 & demanding more O2,   LABS 2/16:  ABG- pH=7.46, pCO2=35, pO2=134 on 6L/min; Chems- ok x BS=169, A1c=6.5; CBC- normal; TSH=0.39 & FreeT4=1.54; Troponin/ D-dimer/ BNP- all wnl...  CXR 2/16 showed heart at upper lim of norm, hyperinflation, min bibasilar atx, NAD... Marland KitchenMarland KitchenEKG 2/16 showed NSR, rate88, low voltage, otherw wnl...  2DEcho 2/16 showed norm LVF w/ EF=55-60%, Gr1DD, norm valves and norm RV...  We reviewed the following medical problems during today's office visit >>     Hx mild OSA w/ mod desat, loud snoring, & leg jerks (on sleep study 2005)> she tried CPAP10 but stopped on her own & declined to repeat split night study & re-try CPAP rx; she says she's sleeping satis & wakes feeling refreshed...    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 1993; supposed to be on HomeO2- 2L rest & 4L exercise, NEBS w/ AlbutQid, Advair500Bid, Spiriva daily, Mucinex1-2Bid but not taking anything regularly; states that Flutter & Mucinex cause "spasms" in her back; states O2 doesn't help since she can't breathe thru her nose (she has been eval by ENT, [facility] stopped after 96mo9mo"fruitless"; prev stated she "can't breathe at all, can't do anything" etc; SOB w/ ADLs etc; Note> [facility] and last PFT (2011) showed FEV1= 1.5 (55%) & %1sec=59 ==> 3/15 she announced that she's been cured by taking a Magnesium supplement, along w/ herbal formula "Clear lungs" and "Lung tonic"; she had stopped her Oxygen;  Now using O2 up to 15L/min on her own since she can't breathe thru her nose- advised 2L/min rest & 4L/min exercise + humidity, nasal hygiene, etc;  Keep Pred 20mg45mor now & ROV 3wks...    Hx AtypCP> baseline EKG w/ minor NSSTTWA; 2DEcho w/ norm LVF, EF=55-60%, Gr1DD, norm valves, norm LA/RV; Myoview 2009 normal w/o ischemia &  EF=55%; she notes some atypCP & saw DrNisCherly Hensen, she declined Myoview.    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; she knows not to eat sodium, elev legs, wear support hose; Labs 2/16 showed Cr=1.2-3    Impaired Gluc Tolerance, Metabolic syndrome> she refused meds; prev eval by DrEllison w/ rec for Metformin Rx but she rejects the DM diagnosis & refused meds; states "I'm eating healthier" (advised low carb low fat); Labs 2/16 showed BS~160, A1c=6.5...   Marland KitchenMarland KitchenHypothyroid> not currently on meds but she really wants thyroid Rx; prev TFTs all wnl; she prev had Endocrine in [place] prescribe Synthroid for her but she stopped going; clinically & biochemically euthyroid; Labs 2/16 showed TSH=0.39 &  FreeT4=1.54    Morbid Obesity> peak weight ~340# before lap band surg 2009 by DrMMartin; lost to ~250# but then back up to 280-90#; refused f/u w/ [facility] due to cost of visits & lap-band adjustments; 3/16 weight isdown to 255# & we reviewed diet & exercise...    GI- GERD, IBS> she uses OTC PPI as needed; last colonoscopy was 1982 by DrPatterson & wnl, she refuses GI referral & refuses f/u colon etc...    GYN- s/p uterine cancer> s/p lap hyst & BSO [facility]Sk[facility]...    DJD, ?FM, VitD defic> prev on Vicodin, Tramadol, Aleve, VitD; prev eval by DrDeveshwar for rheum; now she states that [name] ("horse linament") applied topically has worked like a miracle & she has increased exercise etc...    Anxiety/ Depression> on Alprazolam 0.75m prn; prev eval by Psychiatry w/ seasonal affective disorder; still sees Psychology [name] re: eating disorder; under considerable stress due to relationship w/ sister, financial pressure, etc... PLAN>>  Asked to continue Pred20/d for now, Advair500Bid, Spiriva daily, NEBS w/ Xopenex Bid-Tid, Mucinex600-2Bid, fluids; we will ask DME for humidity on her O2 & switch to pulse dose where able; she is c/o nasal dryness & we reviewed nasal regimen/hygiene; we offered second/third opinions  (eg-DrKozlow), offered Hospice referral, but [place]... ROV in 3 wks...            Problem List:    OBSTRUCTIVE SLEEP APNEA (ICD-327.23) - sleep study 2005 showed RDI=15 w/ desat to 78%, loud snoring & +leg jerks> on CPAP 10, prev used intermittently but now not at all... ~  11/12:  She has agreed to a new Sleep Study- split night protocol, to recheck her OSA & determine CPAP needs (check ABG if poss too)==> she never followed thru w/ the study. ~  Pt states that she is resting satis and wakes refreshed, energy [name]... ~  12/15: she reports not resting due to left shoulder/ arm pain & we will refer to Ortho (she saw Dr. ZGardenia Phlegm... ~  She continues to refuse CPAP and declines f/u w/ sleep med...  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 supposed to be on NEBULIZER Qid, ADVAIR 500Bid + SPIRIVA daily (compliance is a serious issue- she freq stops all meds) ==> [facility]... ~  A1AT level is normal 218 (83-200) 3/09... ~  baseline CXR w/ borderline Cor, COPD, interstitial scarring/ atelectasis/ obesity... ~  PFTs 3/07 showed FVC=2.74 (82%), FEV1=1.67 (62%), %1sec=61, mid-flows=27%pred... improved from 2005. ~  CTChest in 2007 & 4/09 showed severe centrilob emyphysema, + interstitial fibrosis, sm HH, left renal cyst... neg for PE... ~  2010:  breathing improved w/ weight reduction after Lap-band surg... ~  2/11: COPD exac w/ neg CXR (chr changes, NAD), Rx w/ Depo/ Pred/ Avelox/ Mucinex/ NEBS/ Advair/ Spiriva/ etc... ~  PFTs 3/11 showed FVC= 2.57 (73%), FEV1= 1.51 (55%), %1sec=59, mid-flows= 21%pred. ~  Noncompliant w/ Rx & O2 thru 2012... On disability since 2011 & finally got Medicare coverage 9/12... ~  11/12: presents w/ worsening breathing & dyspnea w/ ADLs ==> we outlined further eval w/ 2DEcho, Sleep Study; Rx w/ NEBS QID, Advair500Bid,  Spiriva daily, Mucinex, Oxygen, Lasix, Zoloft, Xanax, etc (she declined to proceed w/ the sleep study, 2DEcho, etc)... ~  CXR 3/13 showed normal heart size, increased interstitial markings, mild left basilar atelectasis, NAD, DJD in  spine...  ~  4/13: she is c/o the nasal O2 not helping since it's hard to breathe thru nose & wants ENT referral==> saw [name] who wanted to do surg but she has severe COPD & hi risk; asked to start [facility] & she agrees... ~  10/13: she did Pul[facility]hab for 81mo6-7/13 but quit since it was "fruitless"- barely got to do any exercise since it was so difficult getting to the facility,etc; still c/o nasal problems limiting her benefit from O2 & we discussed the poss of ?transtracheal oxygen? ~  9/14: exsmoker quit 1993; supposed to be on HomeO2- 2L rest & 4L exercise, NEBS w/ Albut, Advair500Bid, Spiriva daily, Mucinex1-2Bid but not taking anything regularly; states that Flutter & Mucinex cause "spasms" in her back; states O2 doesn't help since she can't breathe thru her nose (she has been eval by ENT, CErnesto Rutherford; [facility] "fruit[facility]"; states she "can't breathe at all, can't do anything" etc; SOB w/ ADLs=> O2 recert today (see below)... Note> [facility] and last PFT (2011) showed FEV1= 1.5 (55%) & %1sec=59... OXYGEN RE-CERT done today... ~  3/15: see above- pt states that a magnesium supplement along w/ herbal supplements "clear lungs" and "lung tonic" have made all the difference & she no longer needs oxygen, exercising regularly & much improved; still taking Advair500, Spiriva, AlbutHFA prn...  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 ~  CXR 9/15 showed COPD/emphysema, no acute abnormality... ~  11/07/13: Ambulatory O2 Test 9/15> O2 sat on RA at rest= 92% w/ pulse=86;  Nadir O2sat on RA after 2 laps= 84% w/ pulse=96...  ~  11/17/13: she  presented w/ acute SOB, wheezing, COPD exac> treated w/ NEBS, Depo120, Pred taper, Mucinex1200Bid, fluids, & continue the Spiriva & Oxygen; ch her Advair to NEBS w/ Xopenex & Budesonide regularly... ~  10/15: she is much improved w/ Pred taper but she wants off; reminded to use NEBS w/ Xopenex & Budes Bid every day... ~  12/15: she is off the Pred, but also stopped the Xopenex/ Budesonide due to cost; she has gone back on her AdSpangleplus Albut via NEBS, Mucinex/ Fluids/ etc... ~  [facility] No[facility]/ COPD exac> disch on Oxygen, Pred taper, Levaquin750, NEBS w/ Xopenex, Advair500, Spiriva, Mucinex, etc... ~  3/16: post hosp check, very frustrated that she can't get things the way SHE wants them; c/o O2 since she can't breathe thru nose & we reviewed nasal hygiene, huidified O2, ENT re-eval; on Pred taper, off Levaquin now, Advair500Bid, Spiriva daily, NEBS w/ Xopenex Bid, Mucinex (not taking regularly & asked to do 2Bid + fluids); [facility] & recheck pt in 3 weeks...  ATYPICAL CHEST PAIN (ICD-786.50) - eval by DrWall in 2009. ~  baseline EKG w/ NSR, PVC's, NSSTTWA, NAD... ~  2DEcho 2/05 showed mild MR/ TR, norm LA/ RV, EF=50-55%... ~  NuclearStressTest 4/09 was norm- no ischemia, EF=55%... [facility]... ~  3/15: she notes some atypCP & requests a cardiac referral for eval- prev seen by DrWall, we will call for Cards consult per her request... ~  EKG 3/15 showed NSR, rate91, occas PVCs, otherw wnl... ~  4/15: she had Cards eval DrNishan> he did not feel that invasive testing was warranted; she had 2DEcho but  refused Myoview due to co-pay costs...  ~  2DEcho 4/15 showed norm LV sys function w/ EF=60-65%, normal wall motion, Gr1DD, normal valves, normal RV & PA pressures... ~  EKG 2/16 showed NSR, rate88, low voltage, otherw wnl... ~  2DEcho 2/16 showed norm LVF w/ EF=55-60%, Gr1DD, norm valves and norm  RV.  VENOUS INSUFFICIENCY, CHRONIC (ICD-459.81) - Hx chr ven insuffic w/ edema... on LASIX 23m- 1-2 daily (she self medicates)... ~  Labs 3/15 showed Cr=1.5 and she is asked to decr the Lasix to 420mQam...  IMPAIRED GLUCOSE TOLERANCE >>  METABOLIC SYNDROME X (ICGPQ-982.6- she is on diet alone (refused Metformin therapy)... ~  labs 9/08 showed BS=120... FBS 5/08 was 93, HgA1c=7.1 ~  labs 5/10 showed BS= 182, A1c= 6.4 ~  labs 2/11 showed BS= 76, A1c= 6.5 ~  Labs 5/12 showed BS= 103, A1c= 6.5 ~  She saw DrEllison 4/13 w/ A1c=7.1 but she rejects the diagnosis of Diabetes & refused Metform50066m... ~  10/13:  We discussed IMPAIRED GLUCOSE TOLERANCE & need for low carb wt reducing diet and incr exercise... 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 ~  max weight ~340# before lap band surg 7/09 by DrMartin. ~  weight 12/09 = 291# ~  weight 5/10 = 265# ~  weight 11/10- = 254# ~  weight 2/11 = 260# ~  Weight 5/12= 290# ~  Weight 11/12 = 286# ~  Weight 4/13 = 280# => BMI=45 ~  Weight 10/13 = 289# ~  Weight 9/14 = 281# ~  Weight 3/15 = 270# ~  Weight 9/15 = 267# ~  Weight 12/15 = 274# ~  Weight 3/16 = 255# => post hosp...  ? Hx HYPOTHYROIDISM (ICD-244.9) - off thyroid meds since 1/10... she was started on Synthroid and followed by DrRSmith in [place] & last seen 7/09- note reviewed... she refuses f/u by [name]- we will recheck TSH off Synthroid Rx... ~  labs 5/10 off Synthroid since 1/10 showed TSH= 1.13 ~  labs 2/11 showed TSH=  1.82 ~  Labs 5/12 showed TSH= 1.81 ~  2013: She wanted to restart Thyroid hormone but TSH remains normal> referred to Endocrine for further eval but she was upset w/ DrEllison's eval; she will decide if she wants to pursue another opinion... ~  9/14: not currently on meds but she really wants thyroid Rx; prev TFTs all wnl; she prev had Endocrine in [place] prescribe Synthroid for her but she stopped going; clinically & biochemically euthyroid...  ~  3/15: on Synthroid100-12 tab daily; Labs showed TSH= 3.00 ~  She stopped the Synthroid again... ~  Labs 2/16 not on meds showed TSH=0.39 & FreeT4=1.54   GERD (ICD-530.81) - prev on Nexium but off all meds since 1/10... IRRITABLE BOWEL SYNDROME (ICD-564.1) - colonoscopy 1982 by DrPatterson was WNL... She has refused f/u GI eval & f/u colonoscopy...  ?KIDNEY STONE Hx >> pt said she passed a kidney stone 3/11 w/o any medical attention, w/o work up or proof  of same; entry made here for future reference if needed.  UTERINE CANCER, HX OF (ICD-V10.42) - s/p laparoscopic hysterectomy & BSO 7/07 by DrSkinner at [place]...   DEGENERATIVE JOINT DISEASE (ICD-715.90) - on VICODIN tid prn,  ANAPROX 258m Bid prn, ULTRAM 54mPrn... ? 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 she was on Wellbutrin & Vicodin when last seen in 2006... VITAMIN D DEFICIENCY (ICD-268.9) - Vit D level 5/10 = 16... rec> start OTC Vit D 2000 u daily. ~  9/14: on Vicodin, Tramadol, Aleve, VitD, etc; prev eval by DrDeveshwar for rheum; now c/o pain in left knee (she thinks it's "siatica"), can't exercise & wants to see [facility]- ok... ~  3/15: she states that topical [name] ("horse linament") rubbed into knees really helps but she wants to keep the Vicodinon hand just in case...  Hx of HEADACHE (ICD-784.0)  DYSTHYMIA (ICD-300.4) - off Celexa,  off Zoloft, and on ALPRAZOLAM 0.71m27md as needed... she has seen psychiatry  in the past [place] w/ seasonal affective disorder... prev seeing psychologist [name] for eating disorder counselling... ~  4/13: she mentioned seeing [name] again for eating disorder & counseling for depression etc... ~  9/14: on Alprazolam 0.71mg80md prn; prev eval by Psychiatry w/ seasonal affective disorder; still sees Psychology [name] re: eating disorder; under considerable stress due to relationship w/ sister, financial pressure, etc...  ~  3/15: she has the Alpraz0.71mg 29m prn use but seldom uses it; still sees [name] on occas but doesn't think she needs to continue... ~  11/15:  She wants antidepreesant med & we discussed Zoloft tria 100mg-60m => 1 tab daily...  ~  3/16:  She is off the Zoloft & states the AlprazSharin Graveo strong (advised cutting the med & try lower dose for the desired effect...  HEALTH MAINTENANCE:   ~  GI:  Per seen by DrPatterson but last colon was 1982 & she refuses follow up... ~  GYN:  [facility]  She had Pneumovax in the past; she declines Flu shots and Tetanus shots...   Past Surgical History  Procedure Laterality Date  . 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 Appendectomy    . Cholecystectomy  1982  . Laparoscopic hysterectomy  2007    [facility]  . Bilateral salpingoophorectomy  2007    [facility]  . Laparoscopic gastric banding  08/2007    Dr. MartinHassell Donetpatient Encounter Prescriptions as of 05/02/2014  Medication Sig  . albuterol (PROAIR HFA) 108 (90 BASE) MCG/ACT inhaler Inhale 2 puffs into the lungs every 6 (six) hours as needed.  . ALPRAZolam (XANAX) 0.5 MG tablet TAKE 1/2 TO 1 TABLET BY MOUTH 3 TIMES DAILY AS NEEDED FOR NERVES. NOT TO EXCEED 3 PER DAY  . B Complex-C-Folic Acid (B-COMPLEX BALANCED) TABS Take 1 tablet by mouth daily.  . Bee Raelyn Ensignn 1000 MG TABS Take 2 tablets by mouth daily.  . cetirizine (ZYRTEC) 10 MG tablet Take 10 mg by mouth daily.    .  Cholecalciferol (VITAMIN D-3) 5000 UNITS TABS Take 1 tablet by mouth daily.  . Coconut Oil 1000 MG CAPS Take 4 capsules by mouth daily.  . Coenzyme Q10 (COQ10) 100 MG CAPS Take 1 tablet by mouth daily.  . Fluticasone-Salmeterol (ADVAIR) 500-50 MCG/DOSE AEPB Inhale 1 puff  into the lungs 2 (two) times daily.  . furosemide (LASIX) 40 MG tablet TAKE 1 TO 2 TABS BY MOUTH ONCE DAILY AS NEEDED FOR SWELLING  . Krill Oil 500 MG CAPS Take 1 capsule by mouth daily.  Marland Kitchen levalbuterol (XOPENEX) 1.25 MG/3ML nebulizer solution Take 1.25 mg by nebulization 2 (two) times daily.  . Magnesium 400 MG CAPS Take 2 capsules by mouth daily.  . naproxen sodium (ANAPROX) 220 MG tablet Take 2 tabs by mouth once daily   . predniSONE (DELTASONE) 20 MG tablet Take 1 tablet two times daily x 4 days, 1 tablet daily x 4 days, 1/2 tablet daily x 4 days, 1/2 tablet every other day until gone  . sertraline (ZOLOFT) 100 MG tablet Take 1/2 tablet by mouth daily  . tiotropium (SPIRIVA HANDIHALER) 18 MCG inhalation capsule PLACE 1 CAPSULE (18 MCG TOTAL) INTO INHALER AND INHALE DAILY.  . traMADol (ULTRAM) 50 MG tablet TAKE 1 TABLET BY MOUTH  3 TIMES DAILY   AS NEEDED FOR PAIN  . vitamin C (ASCORBIC ACID) 250 MG tablet Take 250 mg by mouth daily.    Allergies  Allergen Reactions  . Penicillins     REACTION: nausea and vomiting    Current Medications, Allergies, Past Medical History, Past Surgical History, Family History, and Social History were reviewed in [employer] record.   Review of Systems         See HPI - all other systems neg except as noted... The patient complains of dyspnea on exertion and difficulty walking; chest discomfort, & leg weakness.  The patient denies anorexia, fever, weight loss, weight gain, vision loss, decreased hearing, hoarseness, syncope, prolonged cough, headaches, hemoptysis, abdominal pain, melena, hematochezia, severe indigestion/heartburn, hematuria, incontinence, suspicious  skin lesions, transient blindness, depression, unusual weight change, abnormal bleeding, enlarged lymph nodes, and angioedema.    Objective:   Physical Exam    WD, Morbidly Obese, 64 y/o WF in no acute distress but c/o dyspnea/ wheezing... GENERAL:  Alert & oriented; pleasant & cooperative... HEENT:  [place]/AT, EOM-wnl, PERRLA, EACs-clear, TMs-wnl, NOSE-clear, THROAT-clear & wnl. NECK:  Supple w/ fairROM; no JVD; normal carotid impulses w/o bruits; no thyromegaly or nodules palpated; no lymphadenopathy. CHEST:  no accessory musc use, decr BS bilat, no wheezing/ rales/ rhonchi... HEART:  Regular Rhythm; without murmurs/ rubs/ or gallops heard... ABDOMEN:  Obese, soft & nontender; normal bowel sounds; no organomegaly or masses detected. EXT: without deformities, mod arthritic changes; no varicose veins/ +venous insuffic/trace edema. NEURO:  CNs intact; no focal neuro deficits... DERM: few ecchymoses, no rash etc...  RADIOLOGY DATA:  Reviewed in the EPIC EMR & discussed w/ the patient...  LABORATORY DATA:  Reviewed in the EPIC EMR & discussed w/ the patient...   Assessment & Plan:    OSA>  As prev noted she needs new sleep study & appropriate new CPAP apparatus to facilitate compliance but she declines the split night study or sleep med referral...  COPD/ Emphysema>  Acute exac 2/16 improved after hosp in [place] w/ Levaquin, Pred taper, NEBS Bid, Avair500, Spiriva, Mucinex etc... Encouraged to stay on her O2 regularly & we will add Humidity, needs max nasal hygiene vs ENT follow up; continue NEBS/ Advair500/ Spiriva regularly & use the Mucinex; we will request the [facility] home Care home COPD program; needs to lose wt & incr exercise...  Hx CP w/ neg cardiac evals including prev Myoviews; 2DEchos w/o incr PA pressures... 4/15> she had Cardiology consult for eval of AtypCP [name]);  EKG was WNL & 2DEcho showed norm LVF, Gr1DD & norm RV & PA pressures... 2/16> [facility]  showed similar good LVF, valves and norm RV...  OBESITY>  She had remote lap band surg by DrMMartin; Met syndrome, must get wt down etc; she is a lap band failure & prev counseling by [name]... 3/16> weight down to 255# post hosp...  ? Hx Hypothy>  TFTs remain normal off meds- no problem...  GI>  GERD> she denies symptoms & uses OTC meds prn...  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  Dysthymia>  Much stress in her life, on Alpraz prn; has embraced alt lifestyle...   Patient's Medications  New Prescriptions   PREDNISONE (DELTASONE) 20 MG TABLET    Take 1 tablet (20 mg total) by mouth daily with breakfast.  Previous Medications   ALBUTEROL (PROAIR HFA) 108 (90 BASE) MCG/ACT INHALER    Inhale 2 puffs into the lungs every 6 (six) hours as needed.   ALPRAZOLAM (XANAX) 0.5 MG TABLET    TAKE 1/2 TO 1 TABLET BY MOUTH 3 TIMES DAILY AS NEEDED FOR NERVES. NOT TO EXCEED 3 PER DAY   B COMPLEX-C-FOLIC ACID (B-COMPLEX BALANCED) TABS    Take 1 tablet by mouth daily.   BEE POLLEN 1000 MG TABS    Take 2 tablets by mouth daily.   CETIRIZINE (ZYRTEC) 10 MG TABLET    Take 10 mg by mouth daily.     CHOLECALCIFEROL (VITAMIN D-3) 5000 UNITS TABS    Take 1 tablet by mouth daily.   COCONUT OIL 1000 MG CAPS    Take 4 capsules by mouth daily.   COENZYME Q10 (COQ10) 100 MG CAPS    Take 1 tablet by mouth daily.   FLUTICASONE-SALMETEROL (ADVAIR) 500-50 MCG/DOSE AEPB    Inhale 1 puff into the lungs 2 (two) times daily.   FUROSEMIDE (LASIX) 40 MG TABLET    TAKE 1 TO 2 TABS BY MOUTH ONCE DAILY AS NEEDED FOR SWELLING   KRILL OIL 500 MG CAPS    Take 1 capsule by mouth daily.   LEVALBUTEROL (XOPENEX) 1.25 MG/3ML NEBULIZER SOLUTION    Take 1.25 mg by nebulization 2 (two) times daily.   MAGNESIUM 400 MG CAPS    Take 2 capsules by mouth daily.   NAPROXEN SODIUM (ANAPROX) 220 MG TABLET    Take 2 tabs by mouth once daily    TIOTROPIUM (SPIRIVA HANDIHALER) 18 MCG INHALATION CAPSULE     PLACE 1 CAPSULE (18 MCG TOTAL) INTO INHALER AND INHALE DAILY.   TRAMADOL (ULTRAM) 50 MG TABLET    TAKE 1 TABLET BY MOUTH  3 TIMES DAILY   AS NEEDED FOR PAIN   VITAMIN C (ASCORBIC ACID) 250 MG TABLET    Take 250 mg by mouth daily.  Modified Medications   No medications on file  Discontinued Medications   PREDNISONE (DELTASONE) 20 MG TABLET    Take 1 tablet two times daily x 4 days, 1 tablet daily x 4 days, 1/2 tablet daily x 4 days, 1/2 tablet every other day until gone   SERTRALINE (ZOLOFT) 100 MG TABLET    Take 1/2 tablet by mouth daily

## 2014-05-03 ENCOUNTER — Telehealth: Payer: Self-pay | Admitting: Pulmonary Disease

## 2014-05-03 NOTE — Telephone Encounter (Addendum)
ATC x 4 line was busy (dialed [phone])

## 2014-05-04 NOTE — Telephone Encounter (Signed)
Attempted to call pt. Line was busy.

## 2014-05-05 DIAGNOSIS — J9611 Chronic respiratory failure with hypoxia: Secondary | ICD-10-CM | POA: Diagnosis not present

## 2014-05-05 DIAGNOSIS — F329 Major depressive disorder, single episode, unspecified: Secondary | ICD-10-CM | POA: Diagnosis not present

## 2014-05-05 DIAGNOSIS — M797 Fibromyalgia: Secondary | ICD-10-CM | POA: Diagnosis not present

## 2014-05-05 DIAGNOSIS — Z9981 Dependence on supplemental oxygen: Secondary | ICD-10-CM | POA: Diagnosis not present

## 2014-05-05 DIAGNOSIS — N281 Cyst of kidney, acquired: Secondary | ICD-10-CM | POA: Diagnosis not present

## 2014-05-05 DIAGNOSIS — Z8542 Personal history of malignant neoplasm of other parts of uterus: Secondary | ICD-10-CM | POA: Diagnosis not present

## 2014-05-05 DIAGNOSIS — Z7952 Long term (current) use of systemic steroids: Secondary | ICD-10-CM | POA: Diagnosis not present

## 2014-05-05 DIAGNOSIS — F419 Anxiety disorder, unspecified: Secondary | ICD-10-CM | POA: Diagnosis not present

## 2014-05-05 DIAGNOSIS — K5641 Fecal impaction: Secondary | ICD-10-CM | POA: Diagnosis not present

## 2014-05-05 DIAGNOSIS — J441 Chronic obstructive pulmonary disease with (acute) exacerbation: Secondary | ICD-10-CM | POA: Diagnosis not present

## 2014-05-05 DIAGNOSIS — E559 Vitamin D deficiency, unspecified: Secondary | ICD-10-CM | POA: Diagnosis not present

## 2014-05-05 DIAGNOSIS — Z791 Long term (current) use of non-steroidal anti-inflammatories (NSAID): Secondary | ICD-10-CM | POA: Diagnosis not present

## 2014-05-05 DIAGNOSIS — I872 Venous insufficiency (chronic) (peripheral): Secondary | ICD-10-CM | POA: Diagnosis not present

## 2014-05-05 DIAGNOSIS — F341 Dysthymic disorder: Secondary | ICD-10-CM | POA: Diagnosis not present

## 2014-05-05 DIAGNOSIS — K219 Gastro-esophageal reflux disease without esophagitis: Secondary | ICD-10-CM | POA: Diagnosis not present

## 2014-05-05 DIAGNOSIS — Z79899 Other long term (current) drug therapy: Secondary | ICD-10-CM | POA: Diagnosis not present

## 2014-05-05 DIAGNOSIS — Z87891 Personal history of nicotine dependence: Secondary | ICD-10-CM | POA: Diagnosis not present

## 2014-05-05 DIAGNOSIS — G4733 Obstructive sleep apnea (adult) (pediatric): Secondary | ICD-10-CM | POA: Diagnosis not present

## 2014-05-05 DIAGNOSIS — J449 Chronic obstructive pulmonary disease, unspecified: Secondary | ICD-10-CM | POA: Diagnosis not present

## 2014-05-05 DIAGNOSIS — M1990 Unspecified osteoarthritis, unspecified site: Secondary | ICD-10-CM | POA: Diagnosis not present

## 2014-05-05 DIAGNOSIS — N200 Calculus of kidney: Secondary | ICD-10-CM | POA: Diagnosis not present

## 2014-05-05 DIAGNOSIS — Z7951 Long term (current) use of inhaled steroids: Secondary | ICD-10-CM | POA: Diagnosis not present

## 2014-05-06 DIAGNOSIS — N200 Calculus of kidney: Secondary | ICD-10-CM | POA: Diagnosis not present

## 2014-05-06 DIAGNOSIS — N281 Cyst of kidney, acquired: Secondary | ICD-10-CM | POA: Diagnosis not present

## 2014-05-07 MED ORDER — LEVALBUTEROL HCL 1.25 MG/3ML IN NEBU: 1.2500 mg | INHALATION_SOLUTION | Freq: Three times a day (TID) | RESPIRATORY_TRACT | Status: DC

## 2014-05-07 NOTE — Telephone Encounter (Signed)
Spoke with pt today and neb meds have been sent to the pharmacy. Nothing further is needed.

## 2014-05-07 NOTE — Telephone Encounter (Signed)
ATC patient. Dialed given number multiple times and three diff ways and all were busy, unable to get through to patient.  [phone]  Called home # listed, [phone], NA and rang several times, no voicemail.

## 2014-05-08 ENCOUNTER — Ambulatory Visit: Payer: Self-pay | Admitting: Pulmonary Disease

## 2014-05-09 ENCOUNTER — Telehealth: Payer: Self-pay | Admitting: Pulmonary Disease

## 2014-05-09 DIAGNOSIS — F329 Major depressive disorder, single episode, unspecified: Secondary | ICD-10-CM | POA: Diagnosis not present

## 2014-05-09 DIAGNOSIS — F341 Dysthymic disorder: Secondary | ICD-10-CM | POA: Diagnosis not present

## 2014-05-09 DIAGNOSIS — J9611 Chronic respiratory failure with hypoxia: Secondary | ICD-10-CM | POA: Diagnosis not present

## 2014-05-09 DIAGNOSIS — I872 Venous insufficiency (chronic) (peripheral): Secondary | ICD-10-CM | POA: Diagnosis not present

## 2014-05-09 DIAGNOSIS — F419 Anxiety disorder, unspecified: Secondary | ICD-10-CM | POA: Diagnosis not present

## 2014-05-09 DIAGNOSIS — J441 Chronic obstructive pulmonary disease with (acute) exacerbation: Secondary | ICD-10-CM | POA: Diagnosis not present

## 2014-05-09 MED ORDER — LEVALBUTEROL HCL 1.25 MG/3ML IN NEBU: 1.2500 mg | INHALATION_SOLUTION | Freq: Three times a day (TID) | RESPIRATORY_TRACT | Status: DC

## 2014-05-09 NOTE — Telephone Encounter (Signed)
Jenny Reichmann returned call

## 2014-05-09 NOTE — Telephone Encounter (Signed)
Spoke with Jenny Reichmann at [facility], wanted to let SN know that she is refusing a palliative care consult.    Dr. [place] [facility].

## 2014-05-09 NOTE — Telephone Encounter (Signed)
lmtcb x1 for Cindy 

## 2014-05-10 ENCOUNTER — Telehealth: Payer: Self-pay | Admitting: Pulmonary Disease

## 2014-05-10 NOTE — Telephone Encounter (Signed)
States she thinks she is feeling this way because she has been on prednisone since she left the hospital and it is making her feel angry and lash out on everyone

## 2014-05-10 NOTE — Telephone Encounter (Signed)
SN is aware and nothing further is needed. 

## 2014-05-10 NOTE — Telephone Encounter (Signed)
Spoke with pt. States that she thinks the prednisone dose needs to be decreased. She is very angry and has thought about choking her sister and enjoyed the thought of it. I advised her that I was going to get her message to the doctor of the day and she very rudely declined. Wants SN to address this when he is back in the office tomorrow.  SN - please advise. Thanks.

## 2014-05-10 NOTE — Telephone Encounter (Signed)
Will route message so that SN will be aware.

## 2014-05-11 NOTE — Telephone Encounter (Signed)
Ok to reduce by half until Dr Lenna Gilford reviews

## 2014-05-11 NOTE — Telephone Encounter (Signed)
Called made pt aware of recs. Also she reports she has not heard anything regarding her O2.  I have sent message to [facility].

## 2014-05-11 NOTE — Telephone Encounter (Signed)
ATC pt NA WCB no VM

## 2014-05-11 NOTE — Telephone Encounter (Signed)
Called made pt aware SN off today. Please advise MW thanks

## 2014-05-11 NOTE — Telephone Encounter (Signed)
Per Lenna Sciara w/ [facility]:  We have already contacted her once and she refused. We will try again.  Thanks

## 2014-05-14 ENCOUNTER — Telehealth: Payer: Self-pay | Admitting: Pulmonary Disease

## 2014-05-14 DIAGNOSIS — J9611 Chronic respiratory failure with hypoxia: Secondary | ICD-10-CM

## 2014-05-14 NOTE — Telephone Encounter (Signed)
Pt is aware to decrease prednisone. Nothing further needed regarding this message

## 2014-05-14 NOTE — Telephone Encounter (Signed)
Spoke with [name] at [facility], states respiratory therapist verified that pt has a home fill system, so she can fill her own tanks.  States that pt is noncompliant with her poc and home fill system and continually requests new tanks despite having the home fill.  States that she cannot be out of 02.   atc pt again, line busy both with and without area code.

## 2014-05-14 NOTE — Telephone Encounter (Signed)
Attempted to contact patient, phone rang busy x 3.. Will try again at later time.

## 2014-05-14 NOTE — Addendum Note (Signed)
Addended by: Len Blalock on: 05/14/2014 03:13 PM   Modules accepted: Orders

## 2014-05-14 NOTE — Telephone Encounter (Signed)
Called spoke with pt. She is wanting to switch dme's. She does not qualify w/ [facility] bc she is not home bound.  Pt was very upset and no longer wants to be with [facility]. She reports the home fill system she has takes about 3 1/2 hrs to fill up, they are too heavy for her. She wants D-tanks. Although pt no longer wants to be with [facility] bc of how they been treating her per pt. She has medicare. PCC's will another DME take her on? thanks

## 2014-05-14 NOTE — Telephone Encounter (Signed)
Order placed. Nothing further needed. 

## 2014-05-14 NOTE — Telephone Encounter (Signed)
[place] pt does not have a POC pt is not happy they worked with her for about 2 hours Levonne Spiller [phone] pt is driving so that cant help since she is not homebound He is at a loss States he cant help her

## 2014-05-14 NOTE — Telephone Encounter (Signed)
[name] returned call [phone]

## 2014-05-14 NOTE — Telephone Encounter (Signed)
Spoke with [name] at [facility] to follow up on 02 order.  She states she will contact the respiratory dept with [facility] and will follow up with [place] once she checks the status.

## 2014-05-14 NOTE — Telephone Encounter (Signed)
Per SN:  Okay to decrease prednisone to 20 mg 1/2 tab daily

## 2014-05-14 NOTE — Telephone Encounter (Signed)
You will have to put in a order thanks libby

## 2014-05-14 NOTE — Telephone Encounter (Signed)
atc pt WITH area code X3, line busy.  Wcb.

## 2014-05-14 NOTE — Telephone Encounter (Signed)
Attempted to call pt. No answer, no option to leave a message. Will try back later.

## 2014-05-15 ENCOUNTER — Telehealth: Payer: Self-pay | Admitting: Pulmonary Disease

## 2014-05-15 NOTE — Telephone Encounter (Signed)
Spoke with [facility]. They are needing OV note from 04/2012 faxed over to them. I have done so. Nothing further needed

## 2014-05-16 ENCOUNTER — Encounter: Payer: Self-pay | Admitting: Family Medicine

## 2014-05-16 ENCOUNTER — Ambulatory Visit (INDEPENDENT_AMBULATORY_CARE_PROVIDER_SITE_OTHER): Payer: Medicare Other | Admitting: Family Medicine

## 2014-05-16 VITALS — BP 128/85 | HR 101 | Ht 66.0 in | Wt 255.2 lb

## 2014-05-16 DIAGNOSIS — M7021 Olecranon bursitis, right elbow: Secondary | ICD-10-CM | POA: Diagnosis not present

## 2014-05-16 NOTE — Patient Instructions (Signed)
You have olecranon bursitis. Ice the area 3-4 times a day for 15 minutes at a time Continue your prednisone as directed. Use compression sleeve or ACE wrap.  You may not want to use this at night though as can be uncomfortable when sleeping. Consider aspiration and injection of the area as well if this worsens, you get a fever, or increased redness (cellulitis). Follow up with me in 1 month or as needed.

## 2014-05-17 DIAGNOSIS — M7021 Olecranon bursitis, right elbow: Secondary | ICD-10-CM | POA: Insufficient documentation

## 2014-05-17 NOTE — Assessment & Plan Note (Signed)
advised patient as this does not appear infected, she is afebrile I would not recommend aspirating and injecting this unless it becomes more symptomatic or infected.  Icing, compression sleeve, elevation.  Continue her prednisone.  F/u in 1 month or as needed.

## 2014-05-17 NOTE — Progress Notes (Signed)
PCP: Noralee Space, MD  Subjective:   HPI: Patient is a 64 y.o. female here for right elbow swelling.  Patient denies known injury. She states over past 3-4 days she has noticed increasing swelling over right elbow. Feels warm to the touch. No fevers. Has been icing. Pain is at 4/10 level here mainly to the touch. Feels stiff.  Past Medical History  Diagnosis Date  . OSA (obstructive sleep apnea)   . COPD (chronic obstructive pulmonary disease)   . Chest pain   . Chronic venous insufficiency   . Metabolic syndrome X   . Morbid obesity   . Hypothyroid   . GERD (gastroesophageal reflux disease)   . IBS (irritable bowel syndrome)   . DJD (degenerative joint disease)   . Fibromyalgia   . Vitamin D deficiency   . History of headache   . Dysthymia   . 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 albuterol (PROAIR HFA) 108 (90 BASE) MCG/ACT inhaler Inhale 2 puffs into the lungs every 6 (six) hours as needed. 1 Inhaler 6  . ALPRAZolam (XANAX) 0.5 MG tablet TAKE 1/2 TO 1 TABLET BY MOUTH 3 TIMES DAILY AS NEEDED FOR NERVES. NOT TO EXCEED 3 PER DAY 90 tablet 5  . B Complex-C-Folic Acid (B-COMPLEX BALANCED) TABS Take 1 tablet by mouth daily.    Raelyn Ensign Pollen 1000 MG TABS Take 2 tablets by mouth daily.    . cetirizine (ZYRTEC) 10 MG tablet Take 10 mg by mouth daily.      . Cholecalciferol (VITAMIN D-3) 5000 UNITS TABS Take 1 tablet by mouth daily.    . Coconut Oil 1000 MG CAPS Take 4 capsules by mouth daily.    . Coenzyme Q10 (COQ10) 100 MG CAPS Take 1 tablet by mouth daily.    . Fluticasone-Salmeterol (ADVAIR) 500-50 MCG/DOSE AEPB Inhale 1 puff into the lungs 2 (two) times daily. 2 each 0  . furosemide (LASIX) 40 MG tablet TAKE 1 TO 2 TABS BY MOUTH ONCE DAILY AS NEEDED FOR SWELLING 60 tablet 4  . Krill Oil 500 MG CAPS Take 1 capsule by mouth daily.    Marland Kitchen levalbuterol (XOPENEX) 1.25  MG/3ML nebulizer solution Take 1.25 mg by nebulization 3 (three) times daily. 270 mL 6  . Magnesium 400 MG CAPS Take 2 capsules by mouth daily.    . naproxen sodium (ANAPROX) 220 MG tablet Take 2 tabs by mouth once daily     . predniSONE (DELTASONE) 20 MG tablet Take 1 tablet (20 mg total) by mouth daily with breakfast. 30 tablet 6  . tiotropium (SPIRIVA HANDIHALER) 18 MCG inhalation capsule PLACE 1 CAPSULE (18 MCG TOTAL) INTO INHALER AND INHALE DAILY. 20 capsule 0  . traMADol (ULTRAM) 50 MG tablet TAKE 1 TABLET BY MOUTH  3 TIMES DAILY   AS NEEDED FOR PAIN 90 tablet 1  . vitamin C (ASCORBIC ACID) 250 MG tablet Take 250 mg by mouth daily.     No current facility-administered medications on file prior to visit.    Past Surgical History  Procedure Laterality Date  . 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 Appendectomy    . Cholecystectomy  1982  . Laparoscopic hysterectomy  2007    [facility]  . Bilateral salpingoophorectomy  2007    [facility]  . Laparoscopic gastric banding  08/2007    Dr. Hassell Done  Allergies  Allergen Reactions  . Penicillins     REACTION: nausea and vomiting    History   Social History  . Marital Status: Single    Spouse Name: N/A  . Number of Children: 1  . Years of Education: N/A   Occupational History  . Not on file.   Social History Main Topics  . Smoking status: Former Smoker -- 1.00 packs/day    Types: Cigarettes    Quit date: 03/03/1991  . Smokeless tobacco: Never Used  . Alcohol Use: No  . Drug Use: No  . Sexual Activity: Not on file   Other Topics Concern  . Not on file   Social History Narrative   Does not exercise   3 cups of coffee a day   Single- one child whom she gave up for adoption at birth   And re-established contact in 2009 w/ new relationship and 2 grand daughters.     Family History  Problem Relation Age of Onset  . Cancer Mother   . Heart failure Father   . Heart attack Sister     BP 128/85 mmHg  Pulse 101  Ht  5\' 6"  (1.676 m)  Wt 255 lb 3.2 oz (115.758 kg)  BMI 41.21 kg/m2  Review of Systems: See HPI above.    Objective:  Physical Exam:  Gen: NAD  Right elbow: Swelling in olecranon bursa.  No bruising, cellulitis, other deformity. Mild TTP over bursa.  No other elbow tenderness. FROM with mild pain on full flexion. Collateral ligaments intact. NVI distally.    Assessment & Plan:  1. Right olecranon bursitis - advised patient as this does not appear infected, she is afebrile I would not recommend aspirating and injecting this unless it becomes more symptomatic or infected.  Icing, compression sleeve, elevation.  Continue her prednisone.  F/u in 1 month or as needed.

## 2014-05-21 ENCOUNTER — Encounter: Payer: Self-pay | Admitting: Pulmonary Disease

## 2014-05-21 ENCOUNTER — Ambulatory Visit (INDEPENDENT_AMBULATORY_CARE_PROVIDER_SITE_OTHER): Payer: Medicare Other | Admitting: Pulmonary Disease

## 2014-05-21 ENCOUNTER — Ambulatory Visit: Payer: Self-pay | Admitting: Pulmonary Disease

## 2014-05-21 VITALS — BP 140/78 | HR 111 | Temp 97.0°F | Wt 256.0 lb

## 2014-05-21 DIAGNOSIS — I872 Venous insufficiency (chronic) (peripheral): Secondary | ICD-10-CM

## 2014-05-21 DIAGNOSIS — J9611 Chronic respiratory failure with hypoxia: Secondary | ICD-10-CM

## 2014-05-21 DIAGNOSIS — F341 Dysthymic disorder: Secondary | ICD-10-CM

## 2014-05-21 DIAGNOSIS — J449 Chronic obstructive pulmonary disease, unspecified: Secondary | ICD-10-CM

## 2014-05-21 DIAGNOSIS — G4733 Obstructive sleep apnea (adult) (pediatric): Secondary | ICD-10-CM

## 2014-05-21 DIAGNOSIS — R0609 Other forms of dyspnea: Secondary | ICD-10-CM | POA: Diagnosis not present

## 2014-05-21 MED ORDER — ALBUTEROL SULFATE HFA 108 (90 BASE) MCG/ACT IN AERS: 2.0000 | INHALATION_SPRAY | Freq: Four times a day (QID) | RESPIRATORY_TRACT | Status: DC | PRN

## 2014-05-21 NOTE — Progress Notes (Signed)
HPI  Review of Systems  Physical Exam  Patient ID: Rebecca [name], female   DOB: Jul 20, 1950, 64 y.o.   MRN: [id number]  Subjective:    Patient ID: Rebecca [name], female    DOB: 28-Aug-1950, 64 y.o.   MRN: [id number]  HPI 65 y/o WF known to me w/ mult med problems as noted below...  ~  SEE PREV EPIC NOTES FOR OLDER DATA >>   ~  May 05, 2013:  11moROV & when GArtis Delaywas seen last (9/14) she was placed on O2 but she was miserable- "couldn't do anything"; she tells me she started doing her own research & found out that Magnesium is very important (helps her leg cramps) and you can still be low w/ normal blood levels in fact everybody is deficient so she started taking a special easily absorbed magnesium supplement and within 4H of the 1st dose her breathing was 100% [name] & she feels that her breathing is cured, hasn't needed any oxygen (but wants to keep it just in case), and now she's walking, exercising, & she's lost 10#...  She also wanted to let me know about the benefits to [name] (horse linament) that she rubs into her knees and it works great, it's a great anti-inflamm & analgesic, doesn't need any Hydrocodone etc... Finally she wanted to let me know about 2 supplements that have been very helpful- "Clear Lungs" and "Lung tonic"...  We reviewed the following medical problems during today's office visit >>     Hx mild OSA w/ mod desat, loud snoring, & leg jerks (on sleep study 2005)> she tried CPAP10 but stopped on her own & declined to repeat split night study & re-try CPAP rx; she says she's sleeping satis & wakes feeling refreshed...    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 1993; supposed to be on HomeO2- 2L rest & 4L exercise, NEBS w/ Albut, Advair500Bid, Spiriva daily, Mucinex1-2Bid but not taking anything regularly; states that Flutter & Mucinex cause "spasms" in her back; states O2 doesn't help since she can't breathe thru her nose (she has been eval by ENT, CErnesto Rutherford; [facility]  [facility] after 221mos "fruitless"; prev stated she "can't breathe at all, can't do anything" etc; SOB w/ ADLs etc; Note> [facility] and last PFT (2011) showed FEV1= 1.5 (55%) & %1sec=59 ==> 3/15 she announced that she's been cured by taking a Magnesium supplement, along w/ herbal formula "Clear lungs" and "Lung tonic"; she has stopped her Oxygen, still using Advair500 & Spiriva, using Mucinex 7 AlbutHFA just prn...     Hx AtypCP> baseline EKG w/ NSSTTWA; prev 2DEcho w/ norm LVF, EF=50-55%, mild MR/TR, norm LA/RV; Myoview 2009 normal w/o ischemia & EF=55%; she notes some atypCP & requests f/u cardiac eval (prev seen by DrWall)...    Ven Insuffic> on Lasix40-2Qam; she knows not to eat sodium, elev legs, wear support hose; Labs 3/15 showed Cr=1.5 & she is asked to decr the Lasix to 4054mm...    Impaired Gluc Tolerance, Metabolic syndrome> she refused meds; prev eval by DrEllison w/ rec for Metformin Rx but she rejects the DM diagnosis & refused meds; states "I'm eating healthier" (advised low carb low fat); Labs 3/15 showed BS=95, A1c=6.8... Marland KitchenMarland Kitchen ?Hypothyroid> not currently on meds but she really wants thyroid Rx; prev TFTs all wnl; she prev had Endocrine in [place] prescribe Synthroid for her but she stopped going; clinically & biochemically euthyroid...    Morbid Obesity>  peak weight ~340# before lap band surg 2009 by DrMMartin; lost to ~250# but then back up to 280-90#; refused f/u w/ [facility] due to cost of visits & lap-band adjustments; 3/15 weight down to 270# & we reviewed diet & exercise...    GI- GERD, IBS> she uses OTC PPI as needed; last colonoscopy was 1982 by DrPatterson & wnl, she refuses GI referral & refuses f/u colon etc...    GYN- s/p uterine cancer> s/p lap hyst & BSO 2007 [facility]Skinner [facility]...    DJD, ?FM, VitD defic> prev on Vicodin, Tramadol, Aleve, VitD; prev eval by DrDeveshwar for rheum; now she states that [name] ("horse linament") applied topically has worked like a  miracle & she has increased exercise etc...    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 We reviewed prob list, meds, xrays and labs> see below for updates >> she refused the 2014 Flu vaccine... She has declined TDAP & Pneumovax as well...  CXR 3/15 showed underlying COPD, atelectasis in RML, DJD in TSpine, NAD; Rec to take OTC Mucinex600-2Bid w/ fluids...  LABS 3/15:  Chems- BS=95 A1c=6.8 (needs low carb diet, exercise, wt loss) & Creat=1.5 (mild renal insuffic due to Lasix rx; Rec to decrease Lasix40 to 1 tab Qam...  2DEcho 4/15 showed norm LV sys function w/ EF=60-65%, normal wall motion, Gr1DD, normal valves, normal RV & PA pressures...  ADDENDUM>>  she had Cards eval DCherly Hensen4/15> he did not feel that invasive testing was warranted; she had 2DEcho but refused Myoview due to co-pay costs...   ~  November 07, 2013:  688moOV & Rebecca [name] reports "I'm doing great, just a little arthritis in my mouse hand"; she also relates a lot of grief over the loss of her 9y/o Min-Pin dog Brixx who ran away several mo ago;  She reports that she participated in a medical study out of [place] regarding spacers for folks w/ COPD using MDIs, no ch in her meds required;  She remains on ADVAIR500Bid, SPIRIVA daily, & ProairHFA prn (uses 3-4 x daily "It really helps");  She has Home O2 but dislikes the Liq O2 therefore not using & she wants a portable O2 concentrator to read 4L/min w/ exercise, 2L/min at rest;  She still doesn't like the nasal cannula, doesn't feel that it gets into her system but like the face mask attachment because she can feel it [name]- of course we discussed all these issues (again)...     She has continued to research her supplements and herbal meds>  She tells me that she is living on smoothies she makes out of juice,  ice, baking soda ("only a tiny bit"), pomegranate syrup, & local honey;  Tumeric helps the arthritis in her knees and she has decreased the amt of aleve she was using;  She notes that pinching her upper lip in the middle under her nose helps relieve musc cramps in her legs;  She states that she hasn't had any palpit or skip beats since starting on a Magnesium supplement...    She still sees [name] for help w/ her eating disorder but was a bit upset that he didn't have a diet handout for her;  We gave her the papers we have here to go by... We reviewed prob list, meds, xrays and labs> see below for updates >> she declines the 2015  Flu vaccine today...  Ambulatory O2 Test 9/15>  O2 sat on RA at rest= 92% w/ pulse=86;  Nadir O2sat on RA after 2 laps= 84% w/ pulse=96...   ~  November 17, 2013:  Artis Delay presents for an urgent add-on appt (refused to go to the ER) w/ acute SOB; she called yest c/o chest tightness, cough, sm amt of whitish sput & wanted something to "dry me up" (given antihist)- she refused antibiotics and Prednisone;   drove herself to the office where she was found to be tachypneic w/ RR=24, hypoxemic on RA w/ O2sat=83%; she denies f/c/s, discolored sput, CP; chest exam w/ decr BS, exp wheezing & using accessory muscles; dry cough, no rales or consolidation...    RX> placed on O2 via mask w/ improvement in sats to 93%; given NEB treatment w/ Xopenex 0.63 x2; given Depo120 as well => improved w/ [name] air movement, decr wheezing, and O2sat up to 97%    REC> to change Advair500 to NEBS w/ ALBUT2.49m Qid (?Xopenex not covered)/ BUDES0.584mBid; Pred 2023m4d tapering sched, Mucinex 1200m12md & fluids... Continue Spiriva daily and home Oxygen... We reviewed prob list, meds, xrays and labs> see below for updates >> reminded of low carb, no sweets, get wt down...  CXR today showed COPD/emphysema, no acute abnormality...  LABS 9/15:  Chems- ok w/ BS=119, A1c=6.7, Cr=1.4;  CBC- ok...  ~  December 15, 2013:  47mo 16mo& Rebecca [name] iArtis Delayuch improved on the Pred taper=> now down to 1/2 Qod but she wants off, feels worse on the day she takes it she says; advised slow taper to 1/2 Q3d til gone, and reminded to use the Xopenex/Budes NEB Bid regularly, and the Mucinex 1200mg 40mw/ Fluids...  She tells me that she has had some recent dental problems and inquired about a state sponsored dental clinic option but there were no openings unless her doctor wrote a letter indicating that her dental prob was contrib to her medical illness etc=> letter written & given to pt per request...  She has been using the Xanax but feels it is too strong for her- even 1/2 tab at bedtime gives her a hangover & she is advised to further decr the dose until she finds the best combination of rest & no morning side effect... Plan ROV 86mo.  34moecember 17, 2015:  86mo ROV386moil is cArtis Delayleft arm pain- shoulder down to the wrist "it's a nerve" she says; discomfort present for 2 wks, can't sleep, "hurt so much I cried", tried OTC meds, horse linament, Tramadol, Hydrocodone & none w/ relief; using heating pad which helps some, she denies neck pain or decr ROM=> we discussed needed referral to [place] [facility]...     She has weaned off the Pred as noted above but she also stopped the Xopenex/ Budesonide via NEB because it was too expensive; she is back on her Advair500-2spBid & Spiriva daily; she has Albuterol2.5 for the Nebulizer to use prn; she continues to take Mucinex1200Bid + Fluids; she notes that her breathing is stable- DOE w/ ADLs and no real improvement, she has not been able to exercise, or diet, and weight is up to 274# w/ BMI~44;  She still desaturates w/ activity on RA but she refuses to wear the oxygen regularly as prescribed- states she uses it intermittently if SOB & take it off 5-120 min later when she recovers;  She has not yet had her dental work;  Zoloft seems to be helping her depression but she does not want to  increase from 41m to 108md;  She is still upset at [facility] comment about her dog Brix who ran away earlier this yr...     We reviewed prob list, meds, xrays and labs> see below for updates >> she refuses the 2015 flu vaccine....   ~  May 02, 2014:  2-73m373moV & post hosp check>  [facility] w/ COPD exac ([place]  Disch on Levaquin750, Pred taper, NEBS w/ Xopenex, and continued on her Advair500Bid, Spiriva daily, Mucinex, Lasix40 prn >>   2/16 [facility] records indicated a thorough evaluation & excellent care provided but treatment plan was hampered by noncompliance- refused CPAP, declines chestPT, alternately stopping her O2 & demanding more O2,   LABS 2/16:  ABG- pH=7.46, pCO2=35, pO2=134 on 6L/min; Chems- ok x BS=169, A1c=6.5; CBC- normal; TSH=0.39 & FreeT4=1.54; Troponin/ D-dimer/ BNP- all wnl...  CXR 2/16 showed heart at upper lim of norm, hyperinflation, min bibasilar atx, NAD... Marland KitchenMarland KitchenEKG 2/16 showed NSR, rate88, low voltage, otherw wnl...  2DEcho 2/16 showed norm LVF w/ EF=55-60%, Gr1DD, norm valves and norm RV...  We reviewed the following medical problems during today's office visit >>     Hx mild OSA w/ mod desat, loud snoring, & leg jerks (on sleep study 2005)> she tried CPAP10 but stopped on her own & declined to repeat split night study & re-try CPAP rx; she says she's sleeping satis & wakes feeling refreshed...    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 1993; supposed to be on HomeO2- 2L rest & 4L exercise, NEBS w/ AlbutQid, Advair500Bid, Spiriva daily, Mucinex1-2Bid but not taking anything regularly; states that Flutter & Mucinex cause "spasms" in her back; states O2 doesn't help since she can't breathe thru her nose (she has been eval by ENT, CroErnesto Rutherfordfinally [facility] stopped after 63mo63mo"fruitless"; prev stated she "can't breathe at all, can't do anything" etc; SOB w/ ADLs etc; Note> [facility] and last PFT (2011) showed FEV1= 1.5 (55%) & %1sec=59 ==> 3/15 she announced that she's been cured by taking a Magnesium supplement, along w/ herbal formula "Clear lungs" and "Lung tonic"; she had stopped her Oxygen;  Now using O2 up to 15L/min on her own since she can't breathe thru her nose- advised 2L/min rest & 4L/min exercise + humidity, nasal hygiene, etc;  Keep Pred 20mg80mor now & ROV 3wks...    Hx AtypCP> baseline EKG w/ minor NSSTTWA; 2DEcho w/ norm LVF, EF=55-60%, Gr1DD, norm valves, norm LA/RV; Myoview 2009 normal w/o ischemia & EF=55%; she notes some atypCP & saw DrNisCherly Hensen, she declined Myoview.    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; she knows not to eat sodium, elev legs, wear support hose; Labs 2/16 showed Cr=1.2-3    Impaired Gluc Tolerance, Metabolic syndrome> she refused meds; prev eval by DrEllison w/ rec for Metformin Rx but she rejects the DM diagnosis & refused meds; states "I'm eating healthier" (advised low carb low fat); Labs 2/16 showed BS~160, A1c=6.5...   Marland KitchenMarland KitchenHypothyroid> not currently on meds but she really wants thyroid Rx; prev TFTs all wnl; she prev had Endocrine in [place] prescribe Synthroid for her but she stopped going; clinically & biochemically euthyroid; Labs 2/16 showed TSH=0.39 & FreeT4=1.54    Morbid Obesity> peak weight ~340# before lap band surg 2009 by DrMMartin;  lost to ~250# but then back up to 280-90#; refused f/u w/ [facility] due to cost of visits & lap-band adjustments; 3/16 weight isdown to 255# & we reviewed diet & exercise...    GI- GERD, IBS> she uses OTC PPI as needed; last colonoscopy was 1982 by DrPatterson & wnl, she refuses GI referral & refuses f/u colon etc...    GYN- s/p uterine cancer> s/p [facility]st & [facility]...    DJD, ?FM, VitD defic> prev on Vicodin, Tramadol, Aleve, VitD; prev eval by DrDeveshwar for rheum; now she states that [name] ("horse linament") applied topically has worked like a miracle &  she has increased exercise etc...    Anxiety/ Depression> on Alprazolam 0.60m prn; prev eval by Psychiatry w/ seasonal affective disorder; still sees Psychology [name] re: eating disorder; under considerable stress due to relationship w/ sister, financial pressure, etc... PLAN>>  Asked to continue Pred20/d for now, Advair500Bid, Spiriva daily, NEBS w/ Xopenex Bid-Tid, Mucinex600-2Bid, fluids; we will ask DME for humidity on her O2 & switch to pulse dose where able; she is c/o nasal dryness & we reviewed nasal regimen/hygiene; we offered second/third opinions (eg-DrKozlow), offered Hospice referral, but we ultimately decided on referral to [facility]... ROV in 3 wks... Note> [facility] & [facility]; Hospice required home-bound status and [place]; [facility]able to set up humidity & pulse-dose oxygen...   ~  May 21, 2014:  3wk ROV & recheck> GArtis Delayspent most of this 239m ROV ventilating about her frustration over [facility] [facility], her oxygen, the Prednisone, her sister, and life in general; In the interval she developed "ROID RAGE" because of her Pred Rx at 2045m- her breathing was [name] but she flew into a rage & discussed this w/ her psychologist, [name]- then called us [place] instructed to wean down the Pred; now on 27m30mand she is much [name] but she states "I want off" and we outlined a slow weaning schedule for her over the next month;  She also reports an interval trip to the ER w/ constipation...     OSA> she has declined a repeat sleep study, using O2 at night- 2L/min by [place]...    COPD/ Emphysema> on Pred taper (now 27mg58m Advair500Bid, Spiriva daily, Xopenex NEBS Tid, Ventolin HFA prn; based on PFTs in 2011 she had GOLD Stage 2 COPD, patient group B, but has clearly deteriorated over the last few yrs; [place] and she appears improved at present but declines to do f/u PFT at this  time; she is very frustrated w/ [facility]- we have tried to get humidifiers set up & pulse-dose apparatus on her portable equipment (OK to adjust pulse-dose O2 to 3L/min rest & 5L/min exercise)...  We reviewed prob list, meds, xrays and labs> see below for updates >>  PLAN>> We will again try to get [facility] Hospital[facility] her humidity & pulse-dose oxygen (OK'd for 3-5L/min flow);  Wean Pred according to slow taper printed on the AVS; ROV in 6wks w/ PFT...           Problem List:    OBSTRUCTIVE SLEEP APNEA (ICD-327.23) - sleep study 2005 showed RDI=15 w/ desat to 78%, loud snoring & +leg jerks> on CPAP 10, prev used intermittently but now not at all... ~  11/12:  She has agreed to a new Sleep Study- split night protocol, to  recheck her OSA & determine CPAP needs (check ABG if poss too)==> she never followed thru w/ the study. ~  Pt states that she is resting satis and wakes refreshed, energy [name]... ~  12/15: she reports not resting due to left shoulder/ arm pain & we will refer to Ortho (she saw Dr. Gardenia Phlegm)... ~  She continues to refuse CPAP and declines f/u w/ sleep med...  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 supposed to be on NEBULIZER Qid, ADVAIR 500Bid + SPIRIVA daily (compliance is a serious issue- she freq stops all meds) ==> she had a second opinion consult [facility] by DrAdair in 2006... ~  A1AT level is normal 218 (83-200) 3/09... ~  baseline CXR w/ borderline Cor, COPD, interstitial scarring/ atelectasis/ obesity... ~  PFTs 3/07 showed FVC=2.74 (82%), FEV1=1.67 (62%), %1sec=61, mid-flows=27%pred... improved from 2005. ~  [facility] & 4/09 showed severe centrilob emyphysema, + interstitial fibrosis, sm HH, left renal cyst... neg for PE... ~  2010:  breathing improved w/ weight reduction after Lap-band surg... ~  2/11: COPD exac w/ neg CXR (chr changes, NAD), Rx w/ Depo/ Pred/ Avelox/ Mucinex/ NEBS/ Advair/ Spiriva/ etc... ~  PFTs 3/11 showed FVC= 2.57  (73%), FEV1= 1.51 (55%), %1sec=59, mid-flows= 21%pred. ~  Noncompliant w/ Rx & O2 thru 2012... On disability since 2011 & finally got Medicare coverage 9/12... ~  11/12: presents w/ worsening breathing & dyspnea w/ ADLs ==> we outlined further eval w/ 2DEcho, Sleep Study; Rx w/ NEBS QID, Advair500Bid, Spiriva daily, Mucinex, Oxygen, Lasix, Zoloft, Xanax, etc (she declined to proceed w/ the sleep study, 2DEcho, etc)... 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  ~  4/13: she is c/o the nasal O2 not helping since it's hard to breathe thru nose & wants ENT referral==> saw [name] who wanted to do surg but she has severe COPD & hi risk; asked to start PulmRehab & she agrees... ~  10/13: she did PulmRehab for 68mo6-7/13 but quit since it was "fruitless"- barely got to do any exercise since it was so difficult getting to the facility,etc; still c/o nasal problems limiting her benefit from O2 & we discussed the poss of ?transtracheal oxygen? ~  9/14: exsmoker quit 1993; supposed to be on HomeO2- 2L rest & 4L exercise, NEBS w/ Albut, Advair500Bid, Spiriva daily, Mucinex1-2Bid but not taking anything regularly; states that Flutter & Mucinex cause "spasms" in her back; states O2 doesn't help since she can't breathe thru her nose (she has been eval by ENT, CErnesto Rutherford; [facility] tried [facility] "fruitless"; states she "can't breathe at all, can't do anything" etc; SOB w/ ADLs=> O2 recert today (see below)... Note> [facility] and last PFT (2011) showed FEV1= 1.5 (55%) & %1sec=59... OXYGEN RE-CERT done today... ~  3/15: see above- pt states that a magnesium supplement along w/ herbal supplements "clear lungs" and "lung tonic" have made all the difference & she no longer needs oxygen, exercising regularly & much improved; still taking Advair500, Spiriva, AlbutHFA prn...  ~  CXR 3/15 showed  underlying COPD, atelectasis in RML, DJD in TSpine, NAD; Rec to take OTC Mucinex600-2Bid w/ fluids. ~  CXR 9/15 showed COPD/emphysema, no acute abnormality... ~  11/07/13: Ambulatory O2 Test 9/15> O2 sat on RA at rest= 92% w/ pulse=86;  Nadir O2sat on RA after 2 laps= 84% w/ pulse=96...  ~  11/17/13: she presented w/ acute SOB, wheezing, COPD exac> treated w/ NEBS, Depo120, Pred taper, Mucinex1200Bid, fluids, & continue the Spiriva & Oxygen; ch her Advair to NEBS w/ Xopenex & Budesonide regularly... ~  10/15: she is much improved w/ Pred taper but she wants off; reminded to use NEBS w/ Xopenex & Budes Bid every day... ~  12/15: she is off the Pred, but also stopped the Xopenex/ Budesonide due to cost; she has gone back on her [place], plus Albut via NEBS, Mucinex/ Fluids/ etc... ~  [facility] 2/16 by Gaylyn Cheers w/ COPD exac> disch on Oxygen, Pred taper, Levaquin750, NEBS w/ Xopenex, Advair500, Spiriva, Mucinex, etc... ~  3/16: post hosp check, very frustrated that she can't get things the way SHE wants them; c/o O2 since she can't breathe thru nose & we reviewed nasal hygiene, huidified O2, ENT re-eval; on Pred taper, off Levaquin now, Advair500Bid, Spiriva daily, NEBS w/ Xopenex Bid, Mucinex (not taking regularly & asked to do 2Bid + fluids); we will try [facility] COPD program & recheck pt in 3 weeks...  ATYPICAL CHEST PAIN (ICD-786.50) - eval by DrWall in 2009. ~  baseline EKG w/ NSR, PVC's, NSSTTWA, NAD... ~  2DEcho 2/05 showed mild MR/ TR, norm LA/ RV, EF=50-55%... ~  NuclearStressTest 4/09 was norm- no ischemia, EF=55%... similiar to prev studies at [place] & 2007... ~  3/15: she notes some atypCP & requests a cardiac referral for eval- prev seen by DrWall, we will call for Cards consult per her request... ~  EKG 3/15 showed NSR, rate91, occas PVCs, otherw wnl... ~  4/15: she had Cards eval DrNishan> he did not feel that invasive testing was warranted; she had 2DEcho  but refused Myoview due to co-pay costs...  ~  2DEcho 4/15 showed norm LV sys function w/ EF=60-65%, normal wall motion, Gr1DD, normal valves, normal RV & PA pressures... ~  EKG 2/16 showed NSR, rate88, low voltage, otherw wnl... ~  2DEcho 2/16 showed norm LVF w/ EF=55-60%, Gr1DD, norm valves and norm RV.  VENOUS INSUFFICIENCY, CHRONIC (ICD-459.81) - Hx chr ven insuffic w/ edema... on LASIX 18m- 1-2 daily (she self medicates)... ~  Labs 3/15 showed Cr=1.5 and she is asked to decr the Lasix to 473mQam...  IMPAIRED GLUCOSE TOLERANCE >>  METABOLIC SYNDROME X (ICRFF-638.4- she is on diet alone (refused Metformin therapy)... ~  labs 9/08 showed BS=120... FBS 5/08 was 93, HgA1c=7.1 ~  labs 5/10 showed BS= 182, A1c= 6.4 ~  labs 2/11 showed BS= 76, A1c= 6.5 ~  Labs 5/12 showed BS= 103, A1c= 6.5 ~  She saw DrEllison 4/13 w/ A1c=7.1 but she rejects the diagnosis of Diabetes & refused Metform50019m... ~  10/13:  We discussed IMPAIRED GLUCOSE TOLERANCE & need for low carb wt reducing diet and incr exercise... 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 ~  max weight ~340# before lap band surg 7/09 by DrMartin. ~  weight 12/09 = 291# ~  weight 5/10 = 265# ~  weight 11/10- = 254# ~  weight 2/11 = 260# ~  Weight 5/12= 290# ~  Weight 11/12 = 286# ~  Weight 4/13 = 280# => BMI=45 ~  Weight 10/13 = 289# ~  Weight 9/14 = 281# ~  Weight 3/15 = 270# ~  Weight 9/15 = 267# ~  Weight 12/15 = 274# ~  Weight 3/16 = 255# => post hosp...  ? Hx  HYPOTHYROIDISM (ICD-244.9) - off thyroid meds since 1/10... she was started on Synthroid and followed by DrRSmith in [place] & last seen 7/09- note reviewed... she refuses f/u by [name]- we will recheck TSH off Synthroid Rx... ~  labs 5/10 off Synthroid since 1/10 showed TSH= 1.13 ~  labs 2/11 showed TSH= 1.82 ~  Labs 5/12 showed TSH= 1.81 ~  2013: She wanted to restart Thyroid hormone but TSH remains normal> referred to Endocrine for further eval but she was upset w/ DrEllison's eval; she will decide if she wants to pursue another opinion... ~  9/14: not currently on meds but she really wants thyroid Rx; prev TFTs all wnl; she prev had Endocrine in [place] prescribe Synthroid for her but she stopped going; clinically & biochemically euthyroid...  ~  3/15: on Synthroid100-12 tab daily; Labs showed TSH= 3.00 ~  She stopped the Synthroid again... ~  Labs 2/16 not on meds showed TSH=0.39 & FreeT4=1.54   GERD (ICD-530.81) - prev on Nexium but off all meds since 1/10... IRRITABLE BOWEL SYNDROME (ICD-564.1) - colonoscopy 1982 by DrPatterson was WNL... She has refused f/u GI eval & f/u colonoscopy...  ?KIDNEY STONE Hx >> pt said she passed a kidney stone 3/11 w/o any medical attention, w/o work up or proof of same; entry made here for future reference if needed.  UTERINE CANCER, HX OF (ICD-V10.42) - s/p laparoscopic hysterectomy & BSO 7/07 by DrSkinner at [place]...   DEGENERATIVE JOINT DISEASE (ICD-715.90) - on VICODIN tid prn,  ANAPROX 2150m Bid prn, ULTRAM 538mPrn... ? 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 she was on Wellbutrin & Vicodin when last seen in 2006... VITAMIN D DEFICIENCY (ICD-268.9) - Vit D level 5/10 = 16... rec> start OTC Vit D 2000 u daily. ~  9/14: on Vicodin, Tramadol, Aleve, VitD, etc; prev eval by DrDeveshwar for rheum; now c/o pain in left knee (she thinks it's "siatica"), can't exercise & wants to  see [facility]- ok... ~  3/15: she states that topical [name] ("horse linament") rubbed into knees really helps but she wants to keep the Vicodinon hand just in case...  Hx of HEADACHE (ICD-784.0)  DYSTHYMIA (ICD-300.4) - off Celexa,  off Zoloft, and on ALPRAZOLAM 0.50m13md as needed... [place]/ [place]... prev seeing psychologist [name] for eating disorder counselling... ~  4/13: she mentioned seeing [name] again for eating disorder & counseling for depression etc... 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  ~  3/15: she has the Alpraz0.50mg 29m prn use but seldom uses it; still sees [name] on occas but doesn't think she needs to continue... ~  11/15:  She wants antidepreesant med & we discussed Zoloft tria 100mg-69m => 1 tab daily...  ~  3/16:  She is off the Zoloft & states the [place] is too strong (advised cutting the med & try lower dose for the desired effect...  HEALTH MAINTENANCE:   ~  GI:  Per seen by DrPatterson but last colon was 1982 & she refuses follow up... ~  GYN:  She had Lap hyst & BSO 2007 [facility] DrSkinner [facility] ~  Immuniz:  She had Pneumovax in the past; she declines Flu shots and Tetanus shots...   Past Surgical History  Procedure Laterality Date  . 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 Appendectomy    . Cholecystectomy  1982  . Laparoscopic hysterectomy  [place]    [place]  . Bilateral sal[place]ophorectomy  2007    [place]  . Laparoscopic gastric banding  08/2007    Dr. Hassell Done    Outpatient Encounter Prescriptions as of 05/21/2014  Medication Sig  . albuterol (PROAIR HFA) 108 (90 BASE) MCG/ACT inhaler Inhale 2 puffs into the lungs every 6 (six) hours as needed.  . ALPRAZolam (XANAX) 0.5 MG  tablet TAKE 1/2 TO 1 TABLET BY MOUTH 3 TIMES DAILY AS NEEDED FOR NERVES. NOT TO EXCEED 3 PER DAY  . B Complex-C-Folic Acid (B-COMPLEX BALANCED) TABS Take 1 tablet by mouth daily.  Raelyn Ensign Pollen 1000 MG TABS Take 2 tablets by mouth daily.  . cetirizine (ZYRTEC) 10 MG tablet Take 10 mg by mouth daily.    . Cholecalciferol (VITAMIN D-3) 5000 UNITS TABS Take 1 tablet by mouth daily.  . Coconut Oil 1000 MG CAPS Take 4 capsules by mouth daily.  . Coenzyme Q10 (COQ10) 100 MG CAPS Take 1 tablet by mouth daily.  . Fluticasone-Salmeterol (ADVAIR) 500-50 MCG/DOSE AEPB Inhale 1 puff into the lungs 2 (two) times daily.  Javier Docker Oil 500 MG CAPS Take 1 capsule by mouth daily.  Marland Kitchen levalbuterol (XOPENEX) 1.25 MG/3ML nebulizer solution Take 1.25 mg by nebulization 3 (three) times daily.  . Magnesium 400 MG CAPS Take 2 capsules by mouth daily.  . naproxen sodium (ANAPROX) 220 MG tablet Take 2 tabs by mouth once daily   . predniSONE (DELTASONE) 10 MG tablet Take 10 mg by mouth daily.  Marland Kitchen tiotropium (SPIRIVA HANDIHALER) 18 MCG inhalation capsule PLACE 1 CAPSULE (18 MCG TOTAL) INTO INHALER AND INHALE DAILY.  . traMADol (ULTRAM) 50 MG tablet TAKE 1 TABLET BY MOUTH  3 TIMES DAILY   AS NEEDED FOR PAIN  . vitamin C (ASCORBIC ACID) 250 MG tablet Take 250 mg by mouth daily.  . predniSONE (DELTASONE) 20 MG tablet Take 1 tablet (20 mg total) by mouth daily with breakfast. (Patient not taking: Reported on 05/21/2014)  . [DISCONTINUED] furosemide (LASIX) 40 MG tablet TAKE 1 TO 2 TABS BY MOUTH ONCE DAILY AS NEEDED FOR SWELLING    Allergies  Allergen Reactions  . Penicillins     REACTION: nausea and vomiting    Current Medications, Allergies, Past Medical History, Past Surgical History, Family History, and Social History were reviewed in [employer] record.   Review of Systems         See HPI - all other systems neg except as noted... The patient complains of dyspnea on exertion and difficulty  walking; chest discomfort, & leg weakness.  The patient denies anorexia, fever, weight loss, weight gain, vision loss, decreased hearing, hoarseness, syncope, prolonged cough, headaches, hemoptysis, abdominal pain, melena, hematochezia, severe indigestion/heartburn, hematuria, incontinence, suspicious skin lesions, transient blindness, depression, unusual weight change, abnormal bleeding, enlarged lymph  nodes, and angioedema.    Objective:   Physical Exam    WD, Morbidly Obese, 64 y/o WF in no acute distress but c/o dyspnea/ wheezing... GENERAL:  Alert & oriented; pleasant & cooperative... HEENT:  [place]/AT, EOM-wnl, PERRLA, EACs-clear, TMs-wnl, NOSE-clear, THROAT-clear & wnl. NECK:  Supple w/ fairROM; no JVD; normal carotid impulses w/o bruits; no thyromegaly or nodules palpated; no lymphadenopathy. CHEST:  no accessory musc use, decr BS bilat, no wheezing/ rales/ rhonchi... HEART:  Regular Rhythm; without murmurs/ rubs/ or gallops heard... ABDOMEN:  Obese, soft & nontender; normal bowel sounds; no organomegaly or masses detected. EXT: without deformities, mod arthritic changes; no varicose veins/ +venous insuffic/trace edema. NEURO:  CNs intact; no focal neuro deficits... DERM: few ecchymoses, no rash etc...  RADIOLOGY DATA:  Reviewed in the EPIC EMR & discussed w/ the patient...  LABORATORY DATA:  Reviewed in the EPIC EMR & discussed w/ the patient...   Assessment & Plan:    OSA>  As prev noted she needs new sleep study & appropriate new CPAP apparatus to facilitate compliance but she declines the split night study or sleep med referral...  COPD/ Emphysema>  Acute exac 2/16 improved after hosp in [place] w/ Levaquin, Pred taper, NEBS Bid, Avair500, Spiriva, Mucinex etc... Encouraged to stay on her O2 regularly & we will add Humidity, needs max nasal hygiene vs ENT follow up; continue NEBS/ Advair500/ Spiriva regularly & use the Mucinex; we will request the [facility] home Care home COPD  program; needs to lose wt & incr exercise...  Hx CP w/ neg cardiac evals including prev Myoviews; 2DEchos w/o incr PA pressures... 4/15> she had Cardiology consult for eval of AtypCP [name]); EKG was WNL & 2DEcho showed norm LVF, Gr1DD & norm RV & PA pressures... 2/16> repeat 2[facility]cho in [facility] showed similar good LVF, valves and norm RV...  OBESITY>  She had remote lap band surg by DrMMartin; Met syndrome, must get wt down etc; she is a lap band failure & prev counseling by [name]... 3/16> weight down to 255# post hosp...  ? Hx Hypothy>  TFTs remain normal off meds- no problem...  GI>  GERD> she denies symptoms & uses OTC meds prn...  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  Dysthymia>  Much stress in her life, on Alpraz prn; has embraced alt lifestyle...   Patient's Medications  New Prescriptions   ALBUTEROL (PROVENTIL HFA;VENTOLIN HFA) 108 (90 BASE) MCG/ACT INHALER    Inhale 2 puffs into the lungs every 6 (six) hours as needed for wheezing or shortness of breath.  Previous Medications   ALBUTEROL (PROAIR HFA) 108 (90 BASE) MCG/ACT INHALER    Inhale 2 puffs into the lungs every 6 (six) hours as needed.   ALPRAZOLAM (XANAX) 0.5 MG TABLET    TAKE 1/2 TO 1 TABLET BY MOUTH 3 TIMES DAILY AS NEEDED FOR NERVES. NOT TO EXCEED 3 PER DAY   B COMPLEX-C-FOLIC ACID (B-COMPLEX BALANCED) TABS    Take 1 tablet by mouth daily.   BEE POLLEN 1000 MG TABS    Take 2 tablets by mouth daily.   CETIRIZINE (ZYRTEC) 10 MG TABLET    Take 10 mg by mouth daily.     CHOLECALCIFEROL (VITAMIN D-3) 5000 UNITS TABS    Take 1 tablet by mouth daily.   COCONUT OIL 1000 MG CAPS    Take 4 capsules by mouth daily.   COENZYME Q10 (COQ10) 100 MG CAPS  Take 1 tablet by mouth daily.   FLUTICASONE-SALMETEROL (ADVAIR) 500-50 MCG/DOSE AEPB    Inhale 1 puff into the lungs 2 (two) times daily.   KRILL OIL 500 MG CAPS    Take 1 capsule by mouth daily.   LEVALBUTEROL (XOPENEX) 1.25  MG/3ML NEBULIZER SOLUTION    Take 1.25 mg by nebulization 3 (three) times daily.   MAGNESIUM 400 MG CAPS    Take 2 capsules by mouth daily.   NAPROXEN SODIUM (ANAPROX) 220 MG TABLET    Take 2 tabs by mouth once daily    PREDNISONE (DELTASONE) 10 MG TABLET    Take 10 mg by mouth daily.   PREDNISONE (DELTASONE) 20 MG TABLET    Take 1 tablet (20 mg total) by mouth daily with breakfast.   TIOTROPIUM (SPIRIVA HANDIHALER) 18 MCG INHALATION CAPSULE    PLACE 1 CAPSULE (18 MCG TOTAL) INTO INHALER AND INHALE DAILY.   TRAMADOL (ULTRAM) 50 MG TABLET    TAKE 1 TABLET BY MOUTH  3 TIMES DAILY   AS NEEDED FOR PAIN   VITAMIN C (ASCORBIC ACID) 250 MG TABLET    Take 250 mg by mouth daily.  Modified Medications   No medications on file  Discontinued Medications   FUROSEMIDE (LASIX) 40 MG TABLET    TAKE 1 TO 2 TABS BY MOUTH ONCE DAILY AS NEEDED FOR SWELLING

## 2014-05-21 NOTE — Patient Instructions (Signed)
Today we updated your med list in our EPIC system...    Continue your current medications the same- we changed the Proair to Ventolin...  Wean the PREDNISONE 20mg  tabs as we discussed>>     1/2 tab daily for 1 wk...    Then 1/2 tab alternate w/ 1/4 tab every other day for 1 wk or so (10mg -5mg -10-5...)    Then decrease to 1/2 tab every other day for 1 wk or so (10mg -0-10-0...)    Then dercrease to 1/4 tab every other day for 1 wk or so (5mg -0-5-0...).Marland KitchenMarland Kitchen    Then stop...  Continue your other meds the same...  We will attempt to get [facility] to set up your humidity & the pulse dose device (at 3L/min rest & 5L/min exercise)...  Call for any questions...  Let's plan a follow up visit in 6wks, sooner if needed for problems.Marland KitchenMarland Kitchen

## 2014-05-23 ENCOUNTER — Ambulatory Visit: Payer: Self-pay | Admitting: Pulmonary Disease

## 2014-05-31 ENCOUNTER — Telehealth: Payer: Self-pay | Admitting: Pulmonary Disease

## 2014-05-31 NOTE — Telephone Encounter (Signed)
ATC, line was busy 

## 2014-05-31 NOTE — Telephone Encounter (Signed)
atc pt X4, 2 times with area code and 2 times without- line busy all 4 times.  Wcb.

## 2014-06-01 NOTE — Telephone Encounter (Signed)
Called Mobile number (with dialing 336) and line busy x 2  Called home number and line rang multiple times, NA and no option to leave a msg

## 2014-06-01 NOTE — Telephone Encounter (Signed)
Attempted to call pt. Line was busy. Will try back. 

## 2014-06-01 NOTE — Telephone Encounter (Signed)
ATC mobile with 336- line still busy  Now home number busy as well

## 2014-06-04 NOTE — Telephone Encounter (Signed)
Called and spoke to pt. Pt requesting a POC. Pt also stated she is having an increase in SOB lately and is requesting an appt with SN. Appt made with SN on 4/5. Pt verbalized understanding and stated she will discuss the O2 issues with SN at appt and denied any further questions or concerns at this time.

## 2014-06-05 ENCOUNTER — Ambulatory Visit (INDEPENDENT_AMBULATORY_CARE_PROVIDER_SITE_OTHER): Payer: Medicare Other | Admitting: Pulmonary Disease

## 2014-06-05 DIAGNOSIS — J449 Chronic obstructive pulmonary disease, unspecified: Secondary | ICD-10-CM | POA: Diagnosis not present

## 2014-06-06 ENCOUNTER — Telehealth: Payer: Self-pay | Admitting: Pulmonary Disease

## 2014-06-06 ENCOUNTER — Other Ambulatory Visit: Payer: Self-pay | Admitting: Pulmonary Disease

## 2014-06-06 MED ORDER — ALPRAZOLAM 0.5 MG PO TABS: ORAL_TABLET | ORAL | Status: DC

## 2014-06-06 MED ORDER — CYCLOBENZAPRINE HCL 10 MG PO TABS: 10.0000 mg | ORAL_TABLET | Freq: Three times a day (TID) | ORAL | Status: DC | PRN

## 2014-06-06 NOTE — Telephone Encounter (Signed)
Pt requesting refills of Xanax and Flexeril. Please advise if these can be refilled for the patient.  [place] Xanax last filled 04/2013  #90 x 5 refills.  Flexeril last filled 06/2011 #90 x 5 refills.   Please advise Dr Lenna Gilford. Thanks.

## 2014-06-06 NOTE — Telephone Encounter (Signed)
Per SN, ok to refill both meds.   meds updated in chart and called in to pharmacy.   Spoke with pt, she is aware.  Nothing further needed.

## 2014-06-08 DIAGNOSIS — Z87891 Personal history of nicotine dependence: Secondary | ICD-10-CM | POA: Diagnosis not present

## 2014-06-08 DIAGNOSIS — J3489 Other specified disorders of nose and nasal sinuses: Secondary | ICD-10-CM | POA: Diagnosis not present

## 2014-06-08 DIAGNOSIS — H6122 Impacted cerumen, left ear: Secondary | ICD-10-CM | POA: Diagnosis not present

## 2014-06-08 DIAGNOSIS — Z8049 Family history of malignant neoplasm of other genital organs: Secondary | ICD-10-CM | POA: Diagnosis not present

## 2014-06-08 DIAGNOSIS — Z90710 Acquired absence of both cervix and uterus: Secondary | ICD-10-CM | POA: Diagnosis not present

## 2014-06-08 DIAGNOSIS — J449 Chronic obstructive pulmonary disease, unspecified: Secondary | ICD-10-CM | POA: Diagnosis not present

## 2014-06-08 DIAGNOSIS — J343 Hypertrophy of nasal turbinates: Secondary | ICD-10-CM | POA: Diagnosis not present

## 2014-06-08 DIAGNOSIS — R0981 Nasal congestion: Secondary | ICD-10-CM | POA: Diagnosis not present

## 2014-06-08 DIAGNOSIS — Z833 Family history of diabetes mellitus: Secondary | ICD-10-CM | POA: Diagnosis not present

## 2014-06-08 DIAGNOSIS — H9192 Unspecified hearing loss, left ear: Secondary | ICD-10-CM | POA: Diagnosis not present

## 2014-06-08 DIAGNOSIS — Z79899 Other long term (current) drug therapy: Secondary | ICD-10-CM | POA: Diagnosis not present

## 2014-06-08 DIAGNOSIS — Z880 Allergy status to penicillin: Secondary | ICD-10-CM | POA: Diagnosis not present

## 2014-06-10 ENCOUNTER — Encounter: Payer: Self-pay | Admitting: Pulmonary Disease

## 2014-06-10 NOTE — Progress Notes (Signed)
HPI  Review of Systems  Physical Exam  Patient ID: Rebecca [name], female   DOB: Oct 22, 1950, 64 y.o.   MRN: [id number] Subjective:   Patient ID: Rebecca [name], female    DOB: Jul 14, 1950, 64 y.o.   MRN: [id number]  HPI 64 y/o WF known to me w/ mult med problems as noted below...  ~  SEE PREV EPIC NOTES FOR OLDER DATA >>   ~  May 05, 2013:  31moROV & when GArtis Delaywas seen last (9/14) she was placed on O2 but she was miserable- "couldn't do anything"; she tells me she started doing her own research & found out that Magnesium is very important (helps her leg cramps) and you can still be low w/ normal blood levels in fact everybody is deficient so she started taking a special easily absorbed magnesium supplement and within 4H of the 1st dose her breathing was 100% [name] & she feels that her breathing is cured, hasn't needed any oxygen (but wants to keep it just in case), and now she's walking, exercising, & she's lost 10#...  She also wanted to let me know about the benefits to [name] (horse linament) that she rubs into her knees and it works great, it's a great anti-inflamm & analgesic, doesn't need any Hydrocodone etc... Finally she wanted to let me know about 2 supplements that have been very helpful- "Clear Lungs" and "Lung tonic"...  We reviewed the following medical problems during today's office visit >>     Hx mild OSA w/ mod desat, loud snoring, & leg jerks (on sleep study 2005)> she tried CPAP10 but stopped on her own & declined to repeat split night study & re-try CPAP rx; she says she's sleeping satis & wakes feeling refreshed...    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 1993; supposed to be on HomeO2- 2L rest & 4L exercise, NEBS w/ Albut, Advair500Bid, Spiriva daily, Mucinex1-2Bid but not taking anything regularly; states that Flutter & Mucinex cause "spasms" in her back; states O2 doesn't help since she can't breathe thru her nose (she has been eval by ENT, CErnesto Rebecca [name]; finally tried [facility]but  stopped after 265mos "fruitless"; prev stated she "can't breathe at all, can't do anything" etc; SOB w/ ADLs etc; Note> [facility] and last PFT (2011) showed FEV1= 1.5 (55%) & %1sec=59 ==> 3/15 she announced that she's been cured by taking a Magnesium supplement, along w/ herbal formula "Clear lungs" and "Lung tonic"; she has stopped her Oxygen, still using Advair500 & Spiriva, using Mucinex 7 AlbutHFA just prn...     Hx AtypCP> baseline EKG w/ NSSTTWA; prev 2DEcho w/ norm LVF, EF=50-55%, mild MR/TR, norm LA/RV; Myoview 2009 normal w/o ischemia & EF=55%; she notes some atypCP & requests f/u cardiac eval (prev seen by DrWall)...    Ven Insuffic> on Lasix40-2Qam; she knows not to eat sodium, elev legs, wear support hose; Labs 3/15 showed Cr=1.5 & she is asked to decr the Lasix to 4079mm...    Impaired Gluc Tolerance, Metabolic syndrome> she refused meds; prev eval by DrEllison w/ rec for Metformin Rx but she rejects the DM diagnosis & refused meds; states "I'm eating healthier" (advised low carb low fat); Labs 3/15 showed BS=95, A1c=6.8... Marland KitchenMarland Kitchen ?Hypothyroid> not currently on meds but she really wants thyroid Rx; prev TFTs all wnl; she prev had Endocrine in [place] prescribe Synthroid for her but she stopped going; clinically & biochemically euthyroid...    Morbid Obesity> peak weight ~  340# before lap band surg 2009 by DrMMartin; lost to ~250# but then back up to 280-90#; refused f/u w/ [facility] due to cost of visits & lap-band adjustments; 3/15 weight down to 270# & we reviewed diet & exercise...    GI- GERD, IBS> she uses OTC PPI as needed; last colonoscopy was 1982 by DrPatterson & wnl, she refuses GI referral & refuses f/u colon etc...    GYN- s/p uterine cancer> s/p lap hyst & BSO 2007 by DrSkinner at [name]...    DJD, ?FM, VitD defic> prev on Vicodin, Tramadol, Aleve, VitD; prev eval by DrDeveshwar for rheum; now she states that [name] ("horse linament") applied topically has worked like a  miracle & she has increased exercise etc...    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 We reviewed prob list, meds, xrays and labs> see below for updates >> she refused the 2014 Flu vaccine... She has declined TDAP & Pneumovax as well...  CXR 3/15 showed underlying COPD, atelectasis in RML, DJD in TSpine, NAD; Rec to take OTC Mucinex600-2Bid w/ fluids...  LABS 3/15:  Chems- BS=95 A1c=6.8 (needs low carb diet, exercise, wt loss) & Creat=1.5 (mild renal insuffic due to Lasix rx; Rec to decrease Lasix40 to 1 tab Qam...  2DEcho 4/15 showed norm LV sys function w/ EF=60-65%, normal Rebecca [name] motion, Gr1DD, normal valves, normal RV & PA pressures...  ADDENDUM>>  she had Cards eval Rebecca Hensen4/15> he did not feel that invasive testing was warranted; she had 2DEcho but refused Myoview due to co-pay costs...   ~  November 07, 2013:  627moOV & Rebecca [name] reports "I'm doing great, just a little arthritis in my mouse hand"; she also relates a lot of grief over the loss of her 9y/o Min-Pin dog Rebecca [name] who ran away several mo ago;  She reports that she participated in a medical study out of [place] regarding spacers for folks w/ COPD using MDIs, no ch in her meds required;  She remains on ADVAIR500Bid, SPIRIVA daily, & ProairHFA prn (uses 3-4 x daily "It really helps");  She has Home O2 but dislikes the Liq O2 therefore not using & she wants a portable O2 concentrator to read 4L/min w/ exercise, 2L/min at rest;  She still doesn't like the nasal cannula, doesn't feel that it gets into her system but like the face mask attachment because she can feel it [name]- of course we discussed all these issues (again)...     She has continued to research her supplements and herbal meds>  She tells me that she is living on smoothies she makes out of juice,  ice, baking soda ("only a tiny bit"), pomegranate syrup, & local honey;  Tumeric helps the arthritis in her knees and she has decreased the amt of aleve she was using;  She notes that pinching her upper lip in the middle under her nose helps relieve musc cramps in her legs;  She states that she hasn't had any palpit or skip beats since starting on a Magnesium supplement...    She still sees [name] for help w/ her eating disorder but was a bit upset that he didn't have a diet handout for her;  We gave her the papers we have here to go by... We reviewed prob list, meds, xrays and labs> see below for updates >> she declines the 2015 Flu vaccine  today...  Ambulatory O2 Test 9/15>  O2 sat on RA at rest= 92% w/ pulse=86;  Nadir O2sat on RA after 2 laps= 84% w/ pulse=96...   ~  November 17, 2013:  Rebecca [name] presents for an urgent add-on appt (refused to go to the ER) w/ acute SOB; she called yest c/o chest tightness, cough, sm amt of whitish sput & wanted something to "dry me up" (given antihist)- she refused antibiotics and Prednisone;   drove herself to the office where she was found to be tachypneic w/ RR=24, hypoxemic on RA w/ O2sat=83%; she denies f/c/s, discolored sput, CP; chest exam w/ decr BS, exp wheezing & using accessory muscles; dry cough, no rales or consolidation...    RX> placed on O2 via mask w/ improvement in sats to 93%; given NEB treatment w/ Xopenex 0.63 x2; given Depo120 as well => improved w/ [name] air movement, decr wheezing, and O2sat up to 97%    REC> to change Advair500 to NEBS w/ ALBUT2.39m Qid (?Xopenex not covered)/ BUDES0.535mBid; Pred 2062m4d tapering sched, Mucinex 1200m57md & fluids... Continue Spiriva daily and home Oxygen... We reviewed prob list, meds, xrays and labs> see below for updates >> reminded of low carb, no sweets, get wt down...  CXR today showed COPD/emphysema, no acute abnormality...  LABS 9/15:  Chems- ok w/ BS=119, A1c=6.7, Cr=1.4;  CBC- ok...  ~  December 15, 2013:  31mo 67mo& Rebecca [name] iArtis Delayuch improved on the Pred taper=> now down to 1/2 Qod but she wants off, feels worse on the day she takes it she says; advised slow taper to 1/2 Q3d til gone, and reminded to use the Xopenex/Budes NEB Bid regularly, and the Mucinex 1200mg 35mw/ Fluids...  She tells me that she has had some recent dental problems and inquired about a state sponsored dental clinic option but there were no openings unless her doctor wrote a letter indicating that her dental prob was contrib to her medical illness etc=> letter written & given to pt per request...  She has been using the Xanax but feels it is too strong for her- even 1/2 tab at bedtime gives her a hangover & she is advised to further decr the dose until she finds the best combination of rest & no morning side effect... Plan ROV 13mo.  85moecember 17, 2015:  13mo ROV5moil is cArtis Delayleft arm pain- shoulder down to the wrist "it's a nerve" she says; discomfort present for 2 wks, can't sleep, "hurt so much I cried", tried OTC meds, horse linament, Tramadol, Hydrocodone & none w/ relief; using heating pad which helps some, she denies neck pain or decr ROM=> we discussed needed referral to [place] [facility]...     She has weaned off the Pred as noted above but she also stopped the Xopenex/ Budesonide via NEB because it was too expensive; she is back on her Advair500-2spBid & Spiriva daily; she has Albuterol2.5 for the Nebulizer to use prn; she continues to take Mucinex1200Bid + Fluids; she notes that her breathing is stable- DOE w/ ADLs and no real improvement, she has not been able to exercise, or diet, and weight is up to 274# w/ BMI~44;  She still desaturates w/ activity on RA but she refuses to wear the oxygen regularly as prescribed- states she uses it intermittently if SOB & take it off 5-120 min later when she recovers;  She has not yet had her dental work;  ZoloftEngineer, civil (consulting)  seems to be helping her depression but she does not want to  increase from 60m to 1054md;  She is still upset at [facility] his comment about her dog Brix who ran away earlier this yr...     We reviewed prob list, meds, xrays and labs> see below for updates >> she refuses the 2015 flu vaccine....   ~  May 02, 2014:  2-17m5moV & post hosp check>  [place] [facility] [place] [place] w/ COPD exac ([place]);  Disch on Levaquin750, Pred taper, NEBS w/ Xopenex, and continued on her Advair500Bid, Spiriva daily, Mucinex, Lasix40 prn >>   2/16 [facility] records indicated a thorough evaluation & excellent care provided but treatment plan was hampered by noncompliance- refused CPAP, declines chestPT, alternately stopping her O2 & demanding more O2,   LABS 2/16:  ABG- pH=7.46, pCO2=35, pO2=134 on 6L/min; Chems- ok x BS=169, A1c=6.5; CBC- normal; TSH=0.39 & FreeT4=1.54; Troponin/ D-dimer/ BNP- all wnl...  CXR 2/16 showed heart at upper lim of norm, hyperinflation, min bibasilar atx, NAD... Marland KitchenMarland KitchenEKG 2/16 showed NSR, rate88, low voltage, otherw wnl...  2DEcho 2/16 showed norm LVF w/ EF=55-60%, Gr1DD, norm valves and norm RV...  We reviewed the following medical problems during today's office visit >>     Hx mild OSA w/ mod desat, loud snoring, & leg jerks (on sleep study 2005)> she tried CPAP10 but stopped on her own & declined to repeat split night study & re-try CPAP rx; she says she's sleeping satis & wakes feeling refreshed...    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 1993; supposed to be on HomeO2- 2L rest & 4L exercise, NEBS w/ AlbutQid, Advair500Bid, Spiriva daily, Mucinex1-2Bid but not taking anything regularly; states that Flutter & Mucinex cause "spasms" in her back; states O2 doesn't help since she can't breathe thru her nose (she has been eval by ENT, CroErnesto Rutherfordfinally tried [facility] stopped after 27mo14mo"fruitless"; prev stated she "can't breathe at all, can't do anything" etc; SOB w/ ADLs etc; Note> [facility] and last PFT (2011) showed FEV1= 1.5 (55%) & %1sec=59 ==> 3/15 she announced that she's been cured by taking a Magnesium supplement, along w/ herbal formula "Clear lungs" and "Lung tonic"; she had stopped her Oxygen;  Now using O2 up to 15L/min on her own since she can't breathe thru her nose- advised 2L/min rest & 4L/min exercise + humidity, nasal hygiene, etc;  Keep Pred 20mg81mor now & ROV 3wks...    Hx AtypCP> baseline EKG w/ minor NSSTTWA; 2DEcho w/ norm LVF, EF=55-60%, Gr1DD, norm valves, norm LA/RV; Myoview 2009 normal w/o ischemia & EF=55%; she notes some atypCP & saw DrNisCherly [name], she declined Myoview.    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; she knows not to eat sodium, elev legs, wear support hose; Labs 2/16 showed Cr=1.2-3    Impaired Gluc Tolerance, Metabolic syndrome> she refused meds; prev eval by DrEllison w/ rec for Metformin Rx but she rejects the DM diagnosis & refused meds; states "I'm eating healthier" (advised low carb low fat); Labs 2/16 showed BS~160, A1c=6.5...   Marland KitchenMarland KitchenHypothyroid> not currently on meds but she really wants thyroid Rx; prev TFTs all wnl; she prev had Endocrine in [place] prescribe Synthroid for her but she stopped going; clinically & biochemically euthyroid; Labs 2/16 showed TSH=0.39 & FreeT4=1.54    Morbid Obesity> peak weight ~340# before lap band surg 2009 by DrMMartin; lost  to ~250# but then back up to 280-90#; refused f/u w/ [facility] due to cost of visits & lap-band adjustments; 3/16 weight isdown to 255# & we reviewed diet & exercise...    GI- GERD, IBS> she uses OTC PPI as needed; last colonoscopy was 1982 by DrPatterson & wnl, she refuses GI referral & refuses f/u colon etc...    GYN- s/p uterine cancer> s/p lap hyst & BSO 2007 [facility]Skinner [facility]...    DJD, ?FM, VitD defic> prev on Vicodin, Tramadol, Aleve, VitD; prev eval by DrDeveshwar for rheum; now she states that [name] ("horse linament") applied topically has worked like a miracle &  she has increased exercise etc...    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 PLAN>>  Asked to continue Pred20/d for now, Advair500Bid, Spiriva daily, NEBS w/ Xopenex Bid-Tid, Mucinex600-2Bid, fluids; we will ask DME for humidity on her O2 & switch to pulse dose where able; she is c/o nasal dryness & we reviewed nasal regimen/hygiene; we offered second/third opinions (eg-DrKozlow), offered Hospice referral, but we ultimately decided on referral to [facility]... ROV in 3 wks... Note> [facility] & [facility]; Hospice required home-bound status and [place]; [facility] ?unable to set up humidity & pulse-dose oxygen...   ~  May 21, 2014:  3wk ROV & recheck> GArtis Delayspent most of this 266m ROV ventilating about her frustration over [facility], her oxygen, the Prednisone, her sister, and life in general; In the interval she developed "ROID RAGE" because of her Pred Rx at 205m- her breathing was [name] but she flew into a rage & discussed this w/ her psychologist, [name]- then called us [place] instructed to wean down the Pred; now on 56m106mand she is much [name] but she states "I want off" and we outlined a slow weaning schedule for her over the next month;  She also reports an interval trip to the ER w/ constipation...     OSA> she has declined a repeat sleep study, using O2 at night- 2L/min by [place]...    COPD/ Emphysema> on Pred taper (now 56mg23m Advair500Bid, Spiriva daily, Xopenex NEBS Tid, Ventolin HFA prn; based on PFTs in 2011 she had GOLD Stage 2 COPD, patient group B, but has clearly deteriorated over the last few yrs; [place] and she appears improved at present but declines to do f/u PFT at this  time; she is very frustrated w/ [facility]- we have tried to get humidifiers set up & pulse-dose apparatus on her portable equipment (OK to adjust pulse-dose O2 to 3L/min rest & 5L/min exercise)...  We reviewed prob list, meds, xrays and labs> see below for updates >>  PLAN>> We will again try to get [facility] up her humidity & pulse-dose oxygen (OK'd for 3-5L/min flow);  Wean Pred according to slow taper printed on the AVS; ROV in 6wks w/ PFT...  ~  June 05, 2014:  2wk ROV & add-on for SOB> Rebecca [name] iArtis Delaylways SOB due to her severe GOLD stage 3-4 COPD and her CC is "my life is miserable, I hate my life" she is angry at the world- her family (esp sister), [employer], Medicare, all of her doctors, etc... Today she focuses on not being  able to breathe thru her nose & reminds me that [name] wanted to operate but I assessed her operative risk too high; says she can't use her oxygen due to this issue & [facility] has been unable to satisfy her needs in this area (she states that shje has to wait another 55mobefore Medicare will permit changing DME providers)... She is quite tearful but still able to talk w/ loud volume and full sentences;  She has mild cough, sm amt beige sput, no hemoptysis, chr stable/ severe SOB/DOE w/ ADLs and any activity, difficulty caring for her pets...     OSA> she has declined a repeat sleep study, using O2 at night- 2L/min by [place], occas puts it in her mouth...    COPD/ Emphysema> on Pred taper (now 140md), Advair500Bid, Spiriva daily, Xopenex NEBS Tid, Ventolin HFA prn; based on PFTs in 2011 she had GOLD Stage 2 COPD, patient group B, but has clearly deteriorated over the last few yrs; [place]osp [place] as noted and she appears to be stable at present; she agreed to repeat Spirometry today> c/w GOLD Stage 3-4 COPD (see below)...  EXAM shows Afeb, VSS, O2sat=90% on RA in office;  HEENT- nasal congestion, erythema, turbinate hypertrophy, dev septum, throat= Mallampati 2, sl red, no  exudates or lesions;  Chest- decr BS bilat w/o w/r/r & no signs of consolidation;  Ext- VI w/ trace edema;  Psyche- very distraught...  Last CXR 2/16 showed heart at upper lim of norm, hyperinflation, min bibasilar atx, NAD  Spirometry 06/2014 showed FVC=1.61 (50%), FEV1=0.88 (35%), %1sec=55, mi-flows are reduced to 18% predicted... 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 PLAN>>  We discussed referral to medical center to consider ENT eval to help her nasal obstruction, & eval by pulmonary team to see if they can think of anything else to help this nice lady...           Problem List:    OBSTRUCTIVE SLEEP APNEA (ICD-327.23) - sleep study 2005 showed RDI=15 w/ desat to 78%, loud snoring & +leg jerks> on CPAP 10, prev used intermittently but now not at all... ~  11/12:  She has agreed to a new Sleep Study- split night protocol, to recheck her OSA & determine CPAP needs (check ABG if poss too)==> she never followed thru w/ the study. ~  Pt states that she is resting satis and wakes refreshed, energy [name]... ~  12/15: she reports not resting due to left shoulder/ arm pain & we will refer to Ortho (she saw Dr. ZaGardenia Phlegm.. ~  She continues to refuse repeat sleep study, CPAP, and declines f/u w/ sleep med...  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 supposed to be on NEBULIZER Qid, ADVAIR 500Bid + SPIRIVA daily (compliance is a serious issue- she freq stops all meds) ==> [facility]... ~  A1AT level is normal 218 (83-200) 3/09... ~  baseline CXR w/ borderline Cor, COPD, interstitial scarring/ atelectasis/ obesity... ~  PFTs 3/07 showed FVC=2.74 (82%), FEV1=1.67 (62%), %1sec=61, mid-flows=27%pred... improved from 2005. ~  CTChest in 2007 & 4/09 showed severe centrilob emyphysema, + interstitial fibrosis, sm HH, left renal cyst... neg for PE... ~  2010:  breathing improved w/ weight reduction after  Lap-band surg... ~  2/11: COPD exac w/ neg CXR (chr changes, NAD), Rx w/ Depo/ Pred/ Avelox/ Mucinex/ NEBS/ Advair/ Spiriva/ etc... ~  PFTs 3/11 showed FVC= 2.57 (73%), FEV1=  1.51 (55%), %1sec=59, mid-flows= 21%pred. ~  Noncompliant w/ Rx & O2 thru 2012... On disability since 2011 & finally got Medicare coverage 9/12... ~  11/12: presents w/ worsening breathing & dyspnea w/ ADLs ==> we outlined further eval w/ 2DEcho, Sleep Study; Rx w/ NEBS QID, Advair500Bid, Spiriva daily, Mucinex, Oxygen, Lasix, Zoloft, Xanax, etc (she declined to proceed w/ the sleep study, 2DEcho, etc)... 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  ~  4/13: she is c/o the nasal O2 not helping since it's hard to breathe thru nose & wants ENT referral==> saw [name] who wanted to do surg but she has severe COPD & hi risk; asked to start PulmRehab & she agrees... ~  10/13: she did PulmRehab for 34mo6-7/13 but quit since it was "fruitless"- barely got to do any exercise since it was so difficult getting to the facility,etc; still c/o nasal problems limiting her benefit from O2 & we discussed the poss of ?transtracheal oxygen? ~  9/14: exsmoker quit 1993; supposed to be on HomeO2- 2L rest & 4L exercise, NEBS w/ Albut, Advair500Bid, Spiriva daily, Mucinex1-2Bid but not taking anything regularly; states that Flutter & Mucinex cause "spasms" in her back; states O2 doesn't help since she can't breathe thru her nose (she has been eval by ENT, CErnesto Rebecca [name]; [facility] tried [facility] "fruitless"; states she "can't breathe at all, can't do anything" etc; SOB w/ ADLs=> O2 recert today (see below)... Note> [facility] and last PFT (2011) showed FEV1= 1.5 (55%) & %1sec=59... OXYGEN RE-CERT done today... ~  3/15: see above- pt states that a magnesium supplement along w/ herbal supplements "clear lungs" and "lung tonic" have  made all the difference & she no longer needs oxygen, exercising regularly & much improved; still taking Advair500, Spiriva, AlbutHFA prn...  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 ~  CXR 9/15 showed COPD/emphysema, no acute abnormality... ~  11/07/13: Ambulatory O2 Test 9/15> O2 sat on RA at rest= 92% w/ pulse=86;  Nadir O2sat on RA after 2 laps= 84% w/ pulse=96...  ~  11/17/13: she presented w/ acute SOB, wheezing, COPD exac> treated w/ NEBS, Depo120, Pred taper, Mucinex1200Bid, fluids, & continue the Spiriva & Oxygen; ch her Advair to NEBS w/ Xopenex & Budesonide regularly... ~  10/15: she is much improved w/ Pred taper but she wants off; reminded to use NEBS w/ Xopenex & Budes Bid every day... ~  12/15: she is off the Pred, but also stopped the Xopenex/ Budesonide due to cost; she has gone back on her [place] Albut via NEBS, Mucinex/ Fluids/ etc... ~  [facility] NoGaylyn [facility]/ COPD exac> disch on Oxygen, Pred taper, Levaquin750, NEBS w/ Xopenex, Advair500, Spiriva, Mucinex, etc... ~  3/16: post hosp check, very frustrated that she can't get things the way SHE wants them; c/o O2 since she can't breathe thru nose & we reviewed nasal hygiene, huidified O2, ENT re-eval; on Pred taper, off Levaquin now, Advair500Bid, Spiriva daily, NEBS w/ Xopenex Bid, Mucinex (not taking regularly & asked to do 2Bid + fluids); [facility] & recheck pt in 3 weeks... ~  4/16: she continues to be very frustrated w/ her disease & her care- can't breathe thru her nose & she feels the  O2 is not doing her any good; we discussed refer to [place] for their review to see if there is anything they can do.   ATYPICAL CHEST PAIN (ICD-786.50) - eval by DrWall in 2009. ~  baseline EKG w/ NSR, PVC's, NSSTTWA, NAD... ~  2DEcho 2/05 showed mild MR/ TR, norm LA/ RV, EF=50-55%... ~   NuclearStressTest 4/09 was norm- no ischemia, EF=55%... similiar to prev studies at [place] & 2007... ~  3/15: she notes some atypCP & requests a cardiac referral for eval- prev seen by DrWall, we will call for Cards consult per her request... ~  EKG 3/15 showed NSR, rate91, occas PVCs, otherw wnl... ~  4/15: she had Cards eval DrNishan> he did not feel that invasive testing was warranted; she had 2DEcho but refused Myoview due to co-pay costs...  ~  2DEcho 4/15 showed norm LV sys function w/ EF=60-65%, normal Rebecca [name] motion, Gr1DD, normal valves, normal RV & PA pressures... ~  EKG 2/16 showed NSR, rate88, low voltage, otherw wnl... ~  2DEcho 2/16 showed norm LVF w/ EF=55-60%, Gr1DD, norm valves and norm RV.  VENOUS INSUFFICIENCY, CHRONIC (ICD-459.81) - Hx chr ven insuffic w/ edema... on LASIX 65m- 1-2 daily (she self medicates)... ~  Labs 3/15 showed Cr=1.5 and she is asked to decr the Lasix to 472mQam...  IMPAIRED GLUCOSE TOLERANCE >>  METABOLIC SYNDROME X (ICWCH-852.7- she is on diet alone (refused Metformin therapy)... ~  labs 9/08 showed BS=120... FBS 5/08 was 93, HgA1c=7.1 ~  labs 5/10 showed BS= 182, A1c= 6.4 ~  labs 2/11 showed BS= 76, A1c= 6.5 ~  Labs 5/12 showed BS= 103, A1c= 6.5 ~  She saw DrEllison 4/13 w/ A1c=7.1 but she rejects the diagnosis of Diabetes & refused Metform50022m... ~  10/13:  We discussed IMPAIRED GLUCOSE TOLERANCE & need for low carb wt reducing diet and incr exercise... ~  9/14: he refused meds; prev eval by DrEllison w/ rec for Metformin Rx but she rejects the DM diagnosis & refused meds; states "I'm eating healthier" (advised low carb low fat), not checking sugars, prev labs w/ A1c=7.1  ~  3/15: on diet alone; Labs 3/15 showed BS= 95, A1c= 6.8 ~  9/15: on diet alone; Labs 9/15 showed BS= 119, A1c= 6.7 ~  3/16: on diet alone; Labs 2/16 in hosp showed BS~160, A1c=6.5  MORBID OBESITY (ICD-278.01) - she prev saw [name] for counselling about her eating  disorder... ~  max weight ~340# before lap band surg 7/09 by DrMartin. ~  weight 12/09 = 291# ~  weight 5/10 = 265# ~  weight 11/10- = 254# ~  weight 2/11 = 260# ~  Weight 5/12= 290# ~  Weight 11/12 = 286# ~  Weight 4/13 = 280# => BMI=45 ~  Weight 10/13 = 289# ~  Weight 9/14 = 281# ~  Weight 3/15 = 270# ~  Weight 9/15 = 267# ~  Weight 12/15 = 274# ~  Weight 3/16 = 255# => post hosp... ~  Weight 4/16 = 271#  ? Hx HYPOTHYROIDISM (ICD-244.9) - off thyroid meds since 1/10... she was started on Synthroid and followed by DrRSmith in [place] & last seen 7/09- note reviewed... she refuses f/u by [name]- we will recheck TSH off Synthroid Rx... ~  labs 5/10 off Synthroid since 1/10 showed TSH= 1.13 ~  labs 2/11 showed TSH= 1.82 ~  Labs 5/12 showed TSH= 1.81 ~  2013: She wanted to restart Thyroid hormone but TSH remains normal> referred  to Endocrine for further eval but she was upset w/ DrEllison's eval; she will decide if she wants to pursue another opinion... ~  9/14: not currently on meds but she really wants thyroid Rx; prev TFTs all wnl; she prev had Endocrine in [place] prescribe Synthroid for her but she stopped going; clinically & biochemically euthyroid...  ~  3/15: on Synthroid100-12 tab daily; Labs showed TSH= 3.00 ~  She stopped the Synthroid again... ~  Labs 2/16 not on meds showed TSH=0.39 & FreeT4=1.54   GERD (ICD-530.81) - prev on Nexium but off all meds since 1/10... IRRITABLE BOWEL SYNDROME (ICD-564.1) - colonoscopy 1982 by DrPatterson was WNL... She has refused f/u GI eval & f/u colonoscopy...  ?KIDNEY STONE Hx >> pt said she passed a kidney stone 3/11 w/o any medical attention, w/o work up or proof of same; entry made here for future reference if needed.  UTERINE CANCER, HX OF (ICD-V10.42) - s/p laparoscopic hysterectomy & BSO 7/07 by DrSkinner at [place]...   DEGENERATIVE JOINT DISEASE (ICD-715.90) - on VICODIN tid prn,  ANAPROX 286m Bid prn, ULTRAM 569mPrn... ? of  FIBROMYALGIA (ICD-729.1) - she has been eval by DrDeveshwar for Rheum who felt that she has fibromyalgia and DJD... she was on Wellbutrin & Vicodin when last seen in 2006... VITAMIN D DEFICIENCY (ICD-268.9) - Vit D level 5/10 = 16... rec> start OTC Vit D 2000 u daily. ~  9/14: on Vicodin, Tramadol, Aleve, VitD, etc; prev eval by DrDeveshwar for rheum; now c/o pain in left knee (she thinks it's "siatica"), can't exercise & wants to see [facility]- ok... ~  3/15: she states that topical [name] ("horse linament") rubbed into knees really helps but she wants to keep the Vicodinon hand just in case...  Hx of HEADACHE (ICD-784.0)  DYSTHYMIA (ICD-300.4) - off Celexa,  off Zoloft, and on ALPRAZOLAM 0.3m66md as needed... she has seen psychiatry in the [place]/ [place]... prev seeing psychologist [name] for eating disorder counselling... ~  4/13: she mentioned seeing [name] again for eating disorder & counseling for depression etc... 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  ~  3/15: she has the Alpraz0.3mg 72m prn use but seldom uses it; still sees [name] on occas but doesn't think she needs to continue... ~  11/15:  She wants antidepressant med & we discussed Zoloft tria 100mg-91m => 1 tab daily... She still sees Psychologist [name]. ~  3/16:  She is off the Zoloft & states the AlprazSharin Graveo strong (advised cutting the med & try lower dose for the desired effect...  HEALTH MAINTENANCE:   ~  GI:  Per seen by DrPatterson but last colon was 1982 & she refuses follow up... ~  GYN:  [facility]  She had Pneumovax in the past; she declines Flu shots and Tetanus shots...   Past Surgical History  Procedure Laterality  Date  . 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 Appendectomy    . Cholecystectomy  1982  . [place]  . [place]  [place]  . Laparoscopic gastric banding  08/2007    Dr. MartinHassell Donetpatient Encounter Prescriptions as  of 06/05/2014  Medication Sig  . albuterol (PROAIR HFA) 108 (90 BASE) MCG/ACT inhaler Inhale 2 puffs into the lungs every 6 (six) hours as needed.  . B Complex-C-Folic Acid (B-COMPLEX BALANCED) TABS Take 1 tablet by mouth daily.  Raelyn Ensign Pollen 1000 MG TABS Take 2 tablets by mouth daily.  . cetirizine (ZYRTEC) 10 MG tablet Take 10 mg by mouth daily.    . Cholecalciferol (VITAMIN D-3) 5000 UNITS TABS Take 1 tablet by mouth daily.  . Coconut Oil 1000 MG CAPS Take 4 capsules by mouth daily.  . Coenzyme Q10 (COQ10) 100 MG CAPS Take 1 tablet by mouth daily.  . Fluticasone-Salmeterol (ADVAIR) 500-50 MCG/DOSE AEPB Inhale 1 puff into the lungs 2 (two) times daily.  Javier Docker Oil 500 MG CAPS Take 1 capsule by mouth daily.  Marland Kitchen levalbuterol (XOPENEX) 1.25 MG/3ML nebulizer solution Take 1.25 mg by nebulization 3 (three) times daily.  . Magnesium 400 MG CAPS Take 2 capsules by mouth daily.  . naproxen sodium (ANAPROX) 220 MG tablet Take 2 tabs by mouth once daily   . tiotropium (SPIRIVA HANDIHALER) 18 MCG inhalation capsule PLACE 1 CAPSULE (18 MCG TOTAL) INTO INHALER AND INHALE DAILY.  . traMADol (ULTRAM) 50 MG tablet TAKE 1 TABLET BY MOUTH  3 TIMES DAILY   AS NEEDED FOR PAIN  . vitamin C (ASCORBIC ACID) 250 MG tablet Take 250 mg by mouth daily.  . [DISCONTINUED] ALPRAZolam (XANAX) 0.5 MG tablet TAKE 1/2 TO 1 TABLET BY MOUTH 3 TIMES DAILY AS NEEDED FOR NERVES. NOT TO EXCEED 3 PER DAY  . albuterol (PROVENTIL HFA;VENTOLIN HFA) 108 (90 BASE) MCG/ACT inhaler Inhale 2 puffs into the lungs every 6 (six) hours as needed for wheezing or shortness of breath. (Patient not taking: Reported on 06/05/2014)  . [DISCONTINUED] predniSONE  (DELTASONE) 10 MG tablet Take 10 mg by mouth daily.  . [DISCONTINUED] predniSONE (DELTASONE) 20 MG tablet Take 1 tablet (20 mg total) by mouth daily with breakfast. (Patient not taking: Reported on 05/21/2014)    Allergies  Allergen Reactions  . Penicillins     REACTION: nausea and vomiting    Current Medications, Allergies, Past Medical History, Past Surgical History, Family History, and Social History were reviewed in [employer] record.   Review of Systems         See HPI - all other systems neg except as noted... The patient complains of dyspnea on exertion and difficulty walking; chest discomfort, & leg weakness.  The patient denies anorexia, fever, weight loss, weight gain, vision loss, decreased hearing, hoarseness, syncope, prolonged cough, headaches, hemoptysis, abdominal pain, melena, hematochezia, severe indigestion/heartburn, hematuria, incontinence, suspicious skin lesions, transient blindness, depression, unusual weight change, abnormal bleeding, enlarged lymph nodes, and angioedema.    Objective:   Physical Exam    WD, Morbidly Obese, 64 y/o WF in no acute distress but c/o dyspnea/ wheezing... GENERAL:  Alert & oriented; pleasant & cooperative... HEENT:  [place]/AT, EOM-wnl, PERRLA, EACs-clear, TMs-wnl, NOSE-clear, THROAT-clear & wnl. NECK:  Supple w/ fairROM; no JVD; normal carotid impulses w/o bruits; no thyromegaly or nodules palpated; no lymphadenopathy. CHEST:  no accessory musc use, decr BS bilat, no wheezing/ rales/ rhonchi... HEART:  Regular Rhythm; without murmurs/ rubs/ or gallops heard... ABDOMEN:  Obese, soft & nontender; normal bowel sounds; no organomegaly or masses detected. EXT: without deformities, mod arthritic changes; no varicose veins/ +venous insuffic/trace edema. NEURO:  CNs intact; no focal neuro deficits... DERM: few ecchymoses, no rash etc...  RADIOLOGY DATA:  Reviewed in the [facility] EMR & discussed w/ the  patient...  LABORATORY DATA:  Reviewed in the EPIC EMR & discussed w/ the patient...   Assessment & Plan:    OSA>  As prev noted she needs new sleep study & appropriate new CPAP apparatus to facilitate compliance but she declines the repeat study or sleep med referral...  COPD/ Emphysema>  Acute exac 2/16 improved after hosp in [place] w/ Levaquin, Pred taper, NEBS Bid, Avair500, Spiriva, Mucinex etc... Encouraged to stay on her O2 regularly & we will add Humidity, needs max nasal hygiene vs ENT follow up; continue NEBS/ Advair500/ Spiriva regularly & use the Mucinex; we will [facility] the [facility] home Care home COPD program; needs to lose wt & incr exercise... 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  Hx CP w/ neg cardiac evals including prev Myoviews; 2DEchos w/o incr PA pressures... 4/15> she had Cardiology consult for eval of AtypCP Cherly [name]); EKG was WNL & 2DEcho showed norm LVF, Gr1DD & norm RV & PA pressures... 2/16> repeat 2[facility]cho in [facility] showed similar good LVF, valves and norm RV...  OBESITY>  She had remote lap band surg by DrMMartin; Met syndrome, must get wt down etc; she is a lap band failure & prev counseling by [name]... 3/16> weight down to 255# post hosp... 4/16> weight is back up to 271# despite all efforts...  ? Hx Hypothy>  TFTs remain normal off meds- no problem...  GI>  GERD> she denies symptoms & uses OTC meds prn...  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  Dysthymia>  Much stress in her life, on Alpraz prn; has embraced alt lifestyle...   Patient's Medications  New Prescriptions   CYCLOBENZAPRINE (FLEXERIL) 10 MG TABLET    Take 1 tablet (10 mg total) by mouth 3 (three) times daily as needed for muscle spasms.  Previous Medications   ALBUTEROL (PROAIR HFA) 108 (90 BASE)  MCG/ACT INHALER    Inhale 2 puffs into the lungs every 6 (six) hours as needed.   ALBUTEROL (PROVENTIL HFA;VENTOLIN HFA) 108 (90 BASE) MCG/ACT INHALER    Inhale 2 puffs into the lungs every 6 (six) hours as needed for wheezing or shortness of breath.   B COMPLEX-C-FOLIC ACID (B-COMPLEX BALANCED) TABS    Take 1 tablet by mouth daily.   BEE POLLEN 1000 MG TABS    Take 2 tablets by mouth daily.   CETIRIZINE (ZYRTEC) 10 MG TABLET    Take 10 mg by mouth daily.     CHOLECALCIFEROL (VITAMIN D-3) 5000 UNITS TABS    Take 1 tablet by mouth daily.   COCONUT OIL 1000 MG CAPS    Take 4 capsules by mouth daily.   COENZYME Q10 (COQ10) 100 MG CAPS    Take 1 tablet by mouth daily.   FLUTICASONE-SALMETEROL (ADVAIR) 500-50 MCG/DOSE AEPB    Inhale 1 puff into the lungs 2 (two) times daily.   KRILL OIL 500 MG CAPS    Take 1 capsule by mouth daily.   LEVALBUTEROL (XOPENEX) 1.25 MG/3ML NEBULIZER SOLUTION    Take 1.25 mg by nebulization 3 (three) times daily.   MAGNESIUM 400 MG CAPS    Take 2 capsules by mouth daily.   NAPROXEN SODIUM (ANAPROX) 220 MG TABLET    Take 2 tabs by  mouth once daily    TIOTROPIUM (SPIRIVA HANDIHALER) 18 MCG INHALATION CAPSULE    PLACE 1 CAPSULE (18 MCG TOTAL) INTO INHALER AND INHALE DAILY.   TRAMADOL (ULTRAM) 50 MG TABLET    TAKE 1 TABLET BY MOUTH  3 TIMES DAILY   AS NEEDED FOR PAIN   VITAMIN C (ASCORBIC ACID) 250 MG TABLET    Take 250 mg by mouth daily.  Modified Medications   Modified Medication Previous Medication   ALPRAZOLAM (XANAX) 0.5 MG TABLET ALPRAZolam (XANAX) 0.5 MG tablet      TAKE 1/2 TO 1 TABLET BY MOUTH 3 TIMES DAILY AS NEEDED FOR NERVES. NOT TO EXCEED 3 PER DAY    TAKE 1/2 TO 1 TABLET BY MOUTH 3 TIMES DAILY AS NEEDED FOR NERVES. NOT TO EXCEED 3 PER DAY  Discontinued Medications   PREDNISONE (DELTASONE) 10 MG TABLET    Take 10 mg by mouth daily.   PREDNISONE (DELTASONE) 20 MG TABLET    Take 1 tablet (20 mg total) by mouth daily with breakfast.

## 2014-06-10 NOTE — Patient Instructions (Signed)
Today we updated your med list in our EPIC system...    Continue your current medications the same...  You are encouraged to take all meds regularly...  We will set up appts at [place] departments to see what they can do to help your situation...  Call for any questions...  Let's plan a follow up visit in 67mo, sooner if needed for problems.Marland KitchenMarland Kitchen

## 2014-06-21 ENCOUNTER — Telehealth: Payer: Self-pay | Admitting: Pulmonary Disease

## 2014-06-21 NOTE — Telephone Encounter (Signed)
Spoke with pt--will cal once [facility] leave from checking on her concentrator.  Will await call back.

## 2014-06-21 NOTE — Telephone Encounter (Signed)
Patient called and said that she only has 2 hours of oxygen left.  She said that her concentrators alarm went off and she cannot refill her tanks.  She said that when she called [facility] they told her that she had to keep her equipment maintained on her own.  She said that [facility] kills people and that she has everything documented so when she dies because of their negligence, "all hell will break loose" because she is giving all of this information to her lawyers.  I called [name] at [facility], she said that patient refused to use concentrator and wanted to have tanks delivered to her every 2 days.  Now, patient is saying that she uses a concentrator, but it is broken.  [name] says she will call patient to find out what needs to be done and will call us back to let us know if we need to do anything on our end.  Awaiting call back from [facility].

## 2014-06-21 NOTE — Telephone Encounter (Signed)
Per SN: [facility] needs to send a technician to patient's home to troubleshoot her concentrator - she is unable to do this over the phone.  If they are unable to do so, they need to sign off on her so she can switch to another DME.  Thanks.

## 2014-06-21 NOTE — Telephone Encounter (Signed)
Spoke with [name] [facility]--pt called in with complaints of concentrator not working right and an alarm sounding. [facility] tried to trouble shoot this while on the phone with the patient and she told them that she is not going to fix their machine for them and that they needed to send someone to fix it and hung the phone.  Pt then called into our office (as seen below) - [name] states that she called and spoke with the patient this morning after she had spoken to [place] and states that she offered for a tech to go out to the patient's home today to look at concentrator and see what is going on. The patient is going to allow this. [name] states that the patient has been using her back up tanks instead of her concentrator. Seems to [facility] that she does not want to use the concentrator and wants tanks only. Message below states that as well that she wants tanks delivered to her her home every 2 days.  We will know this afternoon or tomorrow if the concentrator is broken or functioning properly and can better access the issue and figure out an action plan.  Will send to Dr Lenna Gilford for rec's.

## 2014-06-22 NOTE — Telephone Encounter (Signed)
Called and spoke to pt. Informed pt of the recs per SN. Pt verbalized understanding and denied any further questions or concerns at this time.

## 2014-06-22 NOTE — Telephone Encounter (Signed)
Patient says they brought in a brand new concentrator and some E tanks.  Patient can now keep the portable tanks full.  Patient would still like to have a portable concentrator that is light enough for her to carry.  Patient thinks that she would do better with a POC that can plug into her car so she can have more oxygen when traveling. Right now the portable tanks she has now only last her 1-2 hours.  She feels that she can be more mobile and more active in her life if she can get a POC.  To Dr. Lenna Gilford for recommendations.

## 2014-06-22 NOTE — Telephone Encounter (Addendum)
Per Dr. Lenna Gilford - please have patient contact her insurance company and find out if they will cover POC.  atc n/a will call back later

## 2014-07-02 ENCOUNTER — Encounter: Payer: Self-pay | Admitting: Pulmonary Disease

## 2014-07-02 ENCOUNTER — Ambulatory Visit (INDEPENDENT_AMBULATORY_CARE_PROVIDER_SITE_OTHER): Payer: Medicare Other | Admitting: Pulmonary Disease

## 2014-07-02 VITALS — BP 130/70 | HR 72 | Temp 97.8°F | Ht 65.0 in | Wt 277.0 lb

## 2014-07-02 DIAGNOSIS — J449 Chronic obstructive pulmonary disease, unspecified: Secondary | ICD-10-CM | POA: Diagnosis not present

## 2014-07-02 DIAGNOSIS — J9611 Chronic respiratory failure with hypoxia: Secondary | ICD-10-CM

## 2014-07-02 NOTE — Patient Instructions (Signed)
Today we updated your med list in our EPIC system...    Continue your current medications the same...  I will send a letter to [facility] requesting the needed portable oxygen concentrator for you...  Try to increase your activity level as we discussed...  Call for any questions...  Let's plan a follow up visit in 71mo.Marland KitchenMarland Kitchen

## 2014-07-02 NOTE — Progress Notes (Signed)
HPI  Review of Systems  Physical Exam  Patient ID: Rebecca [name], female   DOB: [birth year], 64 y.o.   MRN: [id number] Subjective:   Patient ID: Rebecca [name], female    DOB: [birth year], 64 y.o.   MRN: [id number]  HPI 64 y/o WF known to me w/ mult med problems as noted below...  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  LABS 3/15:  Chems- BS=95 A1c=6.8 (needs low carb diet, exercise, wt loss) & Creat=1.5 (mild renal insuffic due to Lasix rx; Rec to decrease Lasix40 to 1 tab Qam...  2DEcho 4/15 showed norm LV sys function w/ EF=60-65%, normal wall motion, Gr1DD, normal valves, normal RV & PA pressures...  ADDENDUM>>  she had Cards eval [name] 4/15> he did not feel that invasive testing was warranted; she had 2DEcho but refused Myoview due to co-pay costs...   ~  November 07, 2013:  27moROV & [name] reports "I'm doing great, just a little arthritis in my mouse hand"; she also relates a lot of grief over the loss of her 9y/o Min-Pin dog Brixx who ran away several mo ago;  She reports that she participated in a medical study out of [place] regarding spacers for folks w/ COPD using MDIs, no ch in her meds required;  She remains on ADVAIR500Bid, SPIRIVA daily, & ProairHFA prn (uses 3-4 x daily "It really helps");  She has Home O2 but dislikes the Liq O2 therefore not using & she wants a portable O2 concentrator to read 4L/min w/ exercise, 2L/min at rest;  She still doesn't like the nasal cannula, doesn't feel that it gets into her system but like the face mask attachment because she can feel it [name]- of course we discussed all these issues (again)...     She has continued to research her supplements and herbal meds>  She tells me that she is living on smoothies she makes out of juice, ice, baking soda ("only a tiny bit"), pomegranate syrup, & local honey;  Tumeric helps the  arthritis in her knees and she has decreased the amt of aleve she was using;  She notes that pinching her upper lip in the middle under her nose helps relieve musc cramps in her legs;  She states that she hasn't had any palpit or skip beats since starting on a Magnesium supplement...    She still sees [name] for help w/ her eating disorder but was a bit upset that he didn't have a diet handout for her;  We gave her the papers we have here to go by... We reviewed prob list, meds, xrays and labs> see below for updates >> she declines the 2015 Flu vaccine today...  Ambulatory O2 Test 9/15>  O2 sat on RA at rest= 92% w/ pulse=86;  Nadir O2sat on RA after 2 laps= 84% w/ pulse=96...   ~  November 17, 2013:  GArtis Delaypresents for an urgent add-on appt (refused to go to the ER) w/ acute SOB; she called yest c/o chest tightness, cough, sm amt of whitish sput & wanted something to "dry me up" (given antihist)- she refused antibiotics and Prednisone;   drove herself to the office where she was found to be tachypneic w/ RR=24, hypoxemic on RA w/ O2sat=83%; she denies f/c/s, discolored sput, CP; chest exam w/ decr BS, exp wheezing & using accessory muscles;  dry cough, no rales or consolidation...    RX> placed on O2 via mask w/ improvement in sats to 93%; given NEB treatment w/ Xopenex 0.63 x2; given Depo120 as well => improved w/ [name] air movement, decr wheezing, and O2sat up to 97%    REC> to change Advair500 to NEBS w/ ALBUT2.65m Qid (?Xopenex not covered)/ BUDES0.559mBid; Pred 2044m4d tapering sched, Mucinex 1200m40md & fluids... Continue Spiriva daily and home Oxygen... We reviewed prob list, meds, xrays and labs> see below for updates >> reminded of low carb, no sweets, get wt down...  CXR today showed COPD/emphysema, no acute abnormality...  LABS 9/15:  Chems- ok w/ BS=119, A1c=6.7, Cr=1.4;  CBC- ok...  ~  December 15, 2013:  80mo 36mo& [name] iArtis Delayuch improved on the Pred taper=> now down to 1/2 Qod but she  wants off, feels worse on the day she takes it she says; advised slow taper to 1/2 Q3d til gone, and reminded to use the Xopenex/Budes NEB Bid regularly, and the Mucinex 1200mg 73mw/ Fluids...  She tells me that she has had some recent dental problems and inquired about a state sponsored dental clinic option but there were no openings unless her doctor wrote a letter indicating that her dental prob was contrib to her medical illness etc=> letter written & given to pt per request...  She has been using the Xanax but feels it is too strong for her- even 1/2 tab at bedtime gives her a hangover & she is advised to further decr the dose until she finds the best combination of rest & no morning side effect... Plan ROV 54mo.  1080moecember 17, 2015:  54mo ROV67moil is cArtis Delayleft arm pain- shoulder down to the wrist "it's a nerve" she says; discomfort present for 2 wks, can't sleep, "hurt so much I cried", tried OTC meds, horse linament, Tramadol, Hydrocodone & none w/ relief; using heating pad which helps some, she denies neck pain or decr ROM=> we discussed needed referral to [place] [facility]...     She has weaned off the Pred as noted above but she also stopped the Xopenex/ Budesonide via NEB because it was too expensive; she is back on her Advair500-2spBid & Spiriva daily; she has Albuterol2.5 for the Nebulizer to use prn; she continues to take Mucinex1200Bid + Fluids; she notes that her breathing is stable- DOE w/ ADLs and no real improvement, she has not been able to exercise, or diet, and weight is up to 274# w/ BMI~44;  She still desaturates w/ activity on RA but she refuses to wear the oxygen regularly as prescribed- states she uses it intermittently if SOB & take it off 5-120 min later when she recovers;  She has not yet had her dental work;  Zoloft seems to be helping her depression but she does not want to increase from 50mg to 59mg/d;  39mis still upset [name] [facility] about her dog  Brix who ran away earlier this yr...     We reviewed prob list, meds, xrays and labs> see below for updates >> she refuses the 2015 flu vaccine....   ~  May 02, 2014:  2-67mo ROV & 680mo hosp check>  [place] Lev[place], Pred taper, NEBS w/ Xopenex, and continued on her Advair500Bid, Spiriva daily, Mucinex, Lasix40 prn >>  2/16 [facility] records indicated a thorough evaluation & excellent care provided but treatment plan was hampered by noncompliance- refused CPAP, declines chestPT, alternately stopping her O2 & demanding more O2,   LABS 2/16:  ABG- pH=7.46, pCO2=35, pO2=134 on 6L/min; Chems- ok x BS=169, A1c=6.5; CBC- normal; TSH=0.39 & FreeT4=1.54; Troponin/ D-dimer/ BNP- all wnl...  CXR 2/16 showed heart at upper lim of norm, hyperinflation, min bibasilar atx, NAD.Marland KitchenMarland Kitchen   EKG 2/16 showed NSR, rate88, low voltage, otherw wnl...  2DEcho 2/16 showed norm LVF w/ EF=55-60%, Gr1DD, norm valves and norm RV...  We reviewed the following medical problems during today's office visit >>     Hx mild OSA w/ mod desat, loud snoring, & leg jerks (on sleep study 2005)> she tried CPAP10 but stopped on her own & declined to repeat split night study & re-try CPAP rx; she says she's sleeping satis & wakes feeling refreshed...    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 1993; supposed to be on HomeO2- 2L rest & 4L exercise, NEBS w/ AlbutQid, Advair500Bid, Spiriva daily, Mucinex1-2Bid but not taking anything regularly; states that Flutter & Mucinex cause "spasms" in her back; states O2 doesn't help since she can't breathe thru her nose (she has been eval by ENT, [name]); finally tried [facility] but stopped after 28moas "fruitless"; prev stated she "can't breathe at all, can't do anything" etc; SOB w/ ADLs etc; Note> [facility] and last PFT (2011) showed FEV1= 1.5 (55%) & %1sec=59 ==> 3/15  she announced that she's been cured by taking a Magnesium supplement, along w/ herbal formula "Clear lungs" and "Lung tonic"; she had stopped her Oxygen;  Now using O2 up to 15L/min on her own since she can't breathe thru her nose- advised 2L/min rest & 4L/min exercise + humidity, nasal hygiene, etc;  Keep Pred 287md for now & ROV 3wks...    Hx AtypCP> baseline EKG w/ minor NSSTTWA; 2DEcho w/ norm LVF, EF=55-60%, Gr1DD, norm valves, norm LA/RV; Myoview 2009 normal w/o ischemia & EF=55%; she notes some atypCP & saw DrCherly Hensen/15, she declined Myoview.    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; she knows not to eat sodium, elev legs, wear support hose; Labs 2/16 showed Cr=1.2-3    Impaired Gluc Tolerance, Metabolic syndrome> she refused meds; prev eval by DrEllison w/ rec for Metformin Rx but she rejects the DM diagnosis & refused meds; states "I'm eating healthier" (advised low carb low fat); Labs 2/16 showed BS~160, A1c=6.5...Marland KitchenMarland Kitchen  ?Hypothyroid> not currently on meds but she really wants thyroid Rx; prev TFTs all wnl; she prev had Endocrine in [place] prescribe Synthroid for her but she stopped going; clinically & biochemically euthyroid; Labs 2/16 showed TSH=0.39 & FreeT4=1.54    Morbid Obesity> peak weight ~340# before lap band surg 2009 by DrMMartin; lost to ~250# but then back up to 280-90#; refused f/u w/ [facility] due to cost of visits & lap-band adjustments; 3/16 weight isdown to 255# & we reviewed diet & exercise...    GI- GERD, IBS> she uses OTC PPI as needed; last colonoscopy was 1982 by DrPatterson & wnl, she refuses GI referral & refuses f/u colon etc...    GYN- s/p uterine cancer> [facility]    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 PLAN>>  Asked to continue Pred20/d for now, Advair500Bid, Spiriva daily, NEBS w/ Xopenex Bid-Tid, Mucinex600-2Bid, fluids; we will ask DME for humidity on her O2 & switch to pulse dose where able; she is c/o nasal dryness & we reviewed nasal regimen/hygiene; we offered second/third opinions (eg-DrKozlow), offered Hospice referral, but we ultimately decided on referral to [facility] program... ROV in 3 wks... Note> [facility] & [facility]; Hospice required home-bound status and [place]; [facility] to set [facility] humidity & pulse-dose oxygen...   ~  May 21, 2014:  3wk ROV & recheck> GArtis Delayspent most of this 270m ROV ventilating about her frustration over [facility], her oxygen, the Prednisone, her sister, and life in general; In the interval she developed "ROID RAGE" because of her Pred Rx at 2039m- her breathing was [name] but she flew into a rage & discussed this w/ her psychologist, [name]- then called us [place] instructed to wean down the Pred; now on 3m29mand she is much [name] but she states "I want off" and we outlined a slow weaning schedule for her over the next month;  She also reports an interval trip to the ER w/ constipation...     OSA> she has declined a repeat sleep study, using O2 at night- 2L/min by [place]...    COPD/ Emphysema> on Pred taper (now 3mg72m Advair500Bid, Spiriva daily, Xopenex NEBS Tid, Ventolin HFA prn; based on PFTs in 2011 she had GOLD Stage 2 COPD, patient group B, but has clearly deteriorated over the last few yrs; [place] and she appears improved at present but declines to do f/u PFT at this time; she is very frustrated w/ [facility]- we have tried to get humidifiers set up & pulse-dose  apparatus on her portable equipment (OK to adjust pulse-dose O2 to 3L/min rest & 5L/min exercise)...  We reviewed prob list, meds, xrays and labs> see below for updates >>  PLAN>> We will again try to get [facility] humidity & pulse-dose oxygen (OK'd for 3-5L/min flow);  Wean Pred according to slow taper printed on the AVS; ROV in 6wks w/ PFT...  ~  June 05, 2014:  2wk ROV & add-on for SOB> [name] iArtis Delaylways SOB due to her severe GOLD stage 3-4 COPD and her CC is "my life is miserable, I hate my life" she is angry at the world- her family (esp sister), [employer], Medicare, all of her doctors, etc... Today she focuses on not being able to breathe thru her nose & reminds me that [name] wanted to operate but I assessed her operative risk too high; says she can't use her oxygen due to this issue & [facility] has been unable to satisfy her needs in this area (she states that shje has to wait another 21mo 43moe Medicare will permit changing DME providers)... She is quite tearful but still able to talk w/ loud volume and full sentences;  She has mild cough, sm amt beige sput, no hemoptysis, chr stable/ severe SOB/DOE w/ ADLs and any activity, difficulty caring for her pets...     OSA> she has declined a repeat sleep study, using O2 at night- 2L/min by [place],  occas puts it in her mouth...    COPD/ Emphysema> on Pred taper (now 6m/d), Advair500Bid, Spiriva daily, Xopenex NEBS Tid, Ventolin HFA prn; based on PFTs in 2011 she had GOLD Stage 2 COPD, patient group B, but has clearly deteriorated over the last few yrs; [place] and she appears to be stable at present; she agreed to repeat Spirometry today> c/w GOLD Stage 3-4 COPD (see below)... EXAM shows Afeb, VSS, O2sat=90% on RA in office;  HEENT- nasal congestion, erythema, turbinate hypertrophy, dev septum, throat= Mallampati 2, sl red, no exudates or lesions;  Chest- decr BS bilat w/o w/r/r & no signs of consolidation;  Ext- VI w/  trace edema;  Psyche- very distraught...  Last CXR 2/16 showed heart at upper lim of norm, hyperinflation, min bibasilar atx, NAD  Spirometry 06/2014 showed FVC=1.61 (50%), FEV1=0.88 (35%), %1sec=55, mi-flows are reduced to 18% predicted... 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 PLAN>>  We discussed referral to medical center to consider ENT eval to help her nasal obstruction, & eval by pulmonary team to see if they can think of anything else to help this nice lady...  ~  Jul 02, 2014:  125moOV & [name] reports good days and bad- today is a good day & she thinks maybe because she didn't take Xanax last PM (she will investigate);  [facility] have to do surg, now on nasal srays + Saline & an ointment, they removed ear wax & plan hearing eval soon;  She is using a new head-set w/ microphone-like oxygen delivery device instead of nasal cannula;  She notes some back spasms and using her Flexeril;  She brought a copy of COPD [employer] & an article that states if a Medicare pt requires a different O2 delivery system, then the DME must comply- she is unable to use the mult portable tanks due to weight, awkward maneuvering, can't manipulate the controls, etc=> we will request an [employer] portable concentrator for her...    She remains on Home O2 at 2L/min, Advair500Bid, Spiriva daily, Xopenex NEBS Tid, Ventolin HFA prn;  Chest is clear w/ decr BS bilat, no w/r/r;  Heart shows RR, no m/r/g... We reviewed prob list, meds, xrays and labs> see below for updates >>  PLAN>>  We will request the port oxygen concentrator for her use=> [facility] She will f/u w/ ENT & [facility] ROV in 14m98mo           Problem List:    OBSTRUCTIVE SLEEP APNEA (ICD-327.23) - sleep study 2005 showed RDI=15 w/ desat to 78%, loud snoring & +leg jerks> on CPAP 10, prev used intermittently but now not at all... ~  11/12:  She has agreed to a new  Sleep Study- split night protocol, to recheck her OSA & determine CPAP needs (check ABG if poss too)==> she never followed thru w/ the study. ~  Pt states that she is resting satis and wakes refreshed, energy [name]... ~  12/15: she reports not resting due to left shoulder/ arm pain & we will refer to Ortho (she saw Dr. ZacGardenia Phlegm. ~  She continues to refuse repeat sleep study, CPAP, and declines f/u w/ sleep med...  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 supposed to be on NEBULIZER Qid, ADVAIR 500Bid + SPIRIVA daily (compliance is a  serious issue- she freq stops all meds) ==> she [facility] by DrAdair in 2006... ~  A1AT level is normal 218 (83-200) 3/09... ~  baseline CXR w/ borderline Cor, COPD, interstitial scarring/ atelectasis/ obesity... ~  PFTs 3/07 showed FVC=2.74 (82%), FEV1=1.67 (62%), %1sec=61, mid-flows=27%pred... improved from 2005. ~  CTChest in 2007 & 4/09 showed severe centrilob emyphysema, + interstitial fibrosis, sm HH, left renal cyst... neg for PE... ~  2010:  breathing improved w/ weight reduction after Lap-band surg... ~  2/11: COPD exac w/ neg CXR (chr changes, NAD), Rx w/ Depo/ Pred/ Avelox/ Mucinex/ NEBS/ Advair/ Spiriva/ etc... ~  PFTs 3/11 showed FVC= 2.57 (73%), FEV1= 1.51 (55%), %1sec=59, mid-flows= 21%pred. ~  Noncompliant w/ Rx & O2 thru 2012... On disability since 2011 & finally got Medicare coverage 9/12... ~  11/12: presents w/ worsening breathing & dyspnea w/ ADLs ==> we outlined further eval w/ 2DEcho, Sleep Study; Rx w/ NEBS QID, Advair500Bid, Spiriva daily, Mucinex, Oxygen, Lasix, Zoloft, Xanax, etc (she declined to proceed w/ the sleep study, 2DEcho, etc)... 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  ~  4/13: she is c/o the nasal O2 not helping since it's hard to breathe thru nose & wants ENT referral==> saw [name]  who wanted to do surg but she has severe COPD & hi risk; asked to start PulmRehab & she agrees... ~  10/13: she did PulmRehab for 75mo6-7/13 but quit since it was "fruitless"- barely got to do any exercise since it was so difficult getting to the facility,etc; still c/o nasal problems limiting her benefit from O2 & we discussed the poss of ?transtracheal oxygen? ~  9/14: exsmoker quit 1993; supposed to be on HomeO2- 2L rest & 4L exercise, NEBS w/ Albut, Advair500Bid, Spiriva daily, Mucinex1-2Bid but not taking anything regularly; states that Flutter & Mucinex cause "spasms" in her back; states O2 doesn't help since she can't breathe thru her nose (she has been eval by ENT, CErnesto Rutherford; finally tried [facility]but stopped after 249mos "fruitless"; states she "can't breathe at all, can't do anything" etc; SOB w/ ADLs=> O2 recert today (see below)... Note> [facility] and last PFT (2011) showed FEV1= 1.5 (55%) & %1sec=59... OXYGEN RE-CERT done today... ~  3/15: see above- pt states that a magnesium supplement along w/ herbal supplements "clear lungs" and "lung tonic" have made all the difference & she no longer needs oxygen, exercising regularly & much improved; still taking Advair500, Spiriva, AlbutHFA prn...  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 ~  CXR 9/15 showed COPD/emphysema, no acute abnormality... ~  11/07/13: Ambulatory O2 Test 9/15> O2 sat on RA at rest= 92% w/ pulse=86;  Nadir O2sat on RA after 2 laps= 84% w/ pulse=96...  ~  11/17/13: she presented w/ acute SOB, wheezing, COPD exac> treated w/ NEBS, Depo120, Pred taper, Mucinex1200Bid, fluids, & continue the Spiriva & Oxygen; ch her Advair to NEBS w/ Xopenex & Budesonide regularly... ~  10/15: she is much improved w/ Pred taper but she wants off; reminded to use NEBS w/ Xopenex & Budes Bid every day... ~  12/15: she is off the Pred, but also stopped  the Xopenex/ Budesonide due to cost; she has gone back on her Advair500 & Spiriva, plus Albut via NEBS, Mucinex/ Fluids/ etc... ~  Hosp 2/16 by Gaylyn Cheers w/ COPD exac> disch on Oxygen, Pred taper, Levaquin750, NEBS w/ Xopenex, Advair500, Spiriva, Mucinex, etc... ~  3/16: post hosp check, very frustrated that she can't get things the way SHE wants them; c/o O2 since she can't breathe thru nose & we reviewed nasal hygiene, huidified O2, ENT re-eval; on Pred taper, off Levaquin now, Advair500Bid, Spiriva daily, NEBS w/ Xopenex Bid, Mucinex (not taking regularly & asked to do 2Bid + fluids); we will try [facility] COPD program & recheck pt in 3 weeks... ~  4/16: she continues to be very frustrated w/ her disease & her care- can't breathe thru her nose & she feels the O2 is not doing her any good; we discussed refer to [place] for their review to see if there is anything they can do.   ATYPICAL CHEST PAIN (ICD-786.50) - eval by DrWall in 2009. ~  baseline EKG w/ NSR, PVC's, NSSTTWA, NAD... ~  2DEcho 2/05 showed mild MR/ TR, norm LA/ RV, EF=50-55%... ~  NuclearStressTest 4/09 was norm- no ischemia, EF=55%... similiar to prev studies at [place] & 2007... ~  3/15: she notes some atypCP & requests a cardiac referral for eval- prev seen by DrWall, we will call for Cards consult per her request... ~  EKG 3/15 showed NSR, rate91, occas PVCs, otherw wnl... ~  4/15: she had Cards eval DrNishan> he did not feel that invasive testing was warranted; she had 2DEcho but refused Myoview due to co-pay costs...  ~  2DEcho 4/15 showed norm LV sys function w/ EF=60-65%, normal wall motion, Gr1DD, normal valves, normal RV & PA pressures... ~  EKG 2/16 showed NSR, rate88, low voltage, otherw wnl... ~  2DEcho 2/16 showed norm LVF w/ EF=55-60%, Gr1DD, norm valves and norm RV.  VENOUS INSUFFICIENCY, CHRONIC (ICD-459.81) - Hx chr ven insuffic w/ edema... on LASIX 64m- 1-2 daily (she self  medicates)... ~  Labs 3/15 showed Cr=1.5 and she is asked to decr the Lasix to 438mQam...  IMPAIRED GLUCOSE TOLERANCE >>  METABOLIC SYNDROME X (ICCXK-481.8- she is on diet alone (refused Metformin therapy)... ~  labs 9/08 showed BS=120... FBS 5/08 was 93, HgA1c=7.1 ~  labs 5/10 showed BS= 182, A1c= 6.4 ~  labs 2/11 showed BS= 76, A1c= 6.5 ~  Labs 5/12 showed BS= 103, A1c= 6.5 ~  She saw DrEllison 4/13 w/ A1c=7.1 but she rejects the diagnosis of Diabetes & refused Metform50024m... ~  10/13:  We discussed IMPAIRED GLUCOSE TOLERANCE & need for low carb wt reducing diet and incr exercise... 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 ~  max weight ~340# before lap band surg 7/09 by DrMartin. ~  weight 12/09 = 291# ~  weight 5/10 = 265# ~  weight 11/10- = 254# ~  weight 2/11 = 260# ~  Weight 5/12= 290# ~  Weight 11/12 = 286# ~  Weight 4/13 = 280# => BMI=45 ~  Weight 10/13 = 289# ~  Weight 9/14 = 281# ~  Weight 3/15 = 270# ~  Weight 9/15 = 267# ~  Weight 12/15 = 274# ~  Weight 3/16 = 255# => post hosp... ~  Weight 4/16 = 271#  ? Hx HYPOTHYROIDISM (ICD-244.9) - off thyroid meds since 1/10... she was started on Synthroid and followed by DrRSmith in [place] & last seen 7/09- note reviewed... she refuses f/u by [name]- we will recheck TSH off Synthroid Rx... ~  labs 5/10 off Synthroid since 1/10 showed TSH= 1.13 ~  labs 2/11 showed TSH= 1.82 ~  Labs 5/12 showed TSH= 1.81 ~  2013: She wanted to restart Thyroid hormone but TSH remains  normal> referred to Endocrine for further eval but she was upset w/ DrEllison's eval; she will decide if she wants to pursue another opinion... ~  9/14: not currently on meds but she really wants thyroid Rx; prev TFTs all wnl; she prev had Endocrine in [place] prescribe Synthroid for her but she stopped going; clinically & biochemically euthyroid...  ~  3/15: on Synthroid100-12 tab daily; Labs showed TSH= 3.00 ~  She stopped the Synthroid again... ~  Labs 2/16 not on meds showed TSH=0.39 & FreeT4=1.54   GERD (ICD-530.81) - prev on Nexium but off all meds since 1/10... IRRITABLE BOWEL SYNDROME (ICD-564.1) - colonoscopy 1982 by DrPatterson was WNL... She has refused f/u GI eval & f/u colonoscopy...  ?KIDNEY STONE Hx >> pt said she passed a kidney stone 3/11 w/o any medical attention, w/o work up or proof of same; entry made here for future reference if needed.  UTERINE CANCER, HX OF (ICD-V10.42) - s/p laparoscopic hysterectomy & BSO 7/07 by [place]inner at [place]...   DEGENERATIVE JOINT DISEASE (ICD-715.90) - on VICODIN tid prn,  ANAPROX 262m Bid prn, ULTRAM 518mPrn... ? 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 she was on Wellbutrin & Vicodin when last seen in 2006... VITAMIN D DEFICIENCY (ICD-268.9) - Vit D level 5/10 = 16... rec> start OTC Vit D 2000 u daily. ~  9/14: on Vicodin, Tramadol, Aleve, VitD, etc; prev eval by DrDeveshwar for rheum; now c/o pain in left knee (she thinks it's "siatica"), can't exercise & wants to see [facility]- ok... ~  3/15: she states that topical [name] ("horse linament") rubbed into knees really helps but she wants to keep the Vicodinon hand just in case...  Hx of HEADACHE (ICD-784.0)  DYSTHYMIA (ICD-300.4) - off Celexa,  off Zoloft, and on ALPRAZOLAM 0.63m47md as needed... she has seen psychiatry in the past ([place]x w/ seasonal affective disorder... prev seeing  psychologist [name] for eating disorder counselling... ~  4/13: she mentioned seeing [name] again for eating disorder & counseling for depression etc... ~  9/14: on Alprazolam 0.63mg40md prn; prev eval by Psychiatry w/ seasonal affective disorder; still sees Psychology [name] re: eating disorder; under considerable stress due to relationship w/ sister, financial pressure, etc...  ~  3/15: she has the Alpraz0.63mg 49m prn use but seldom uses it; still sees [name] on occas but doesn't think she needs to continue... ~  11/15:  She wants antidepressant med & we discussed Zoloft tria 100mg-36m => 1 tab daily... She still sees Psychologist [name]. ~  3/16:  She is off the Zoloft & states the AlprazSharin Graveo strong (advised cutting the med & try lower dose for the desired effect...  HEALTH MAINTENANCE:   ~  GI:  Per seen by DrPatterson but last colon was 1982 & she refuses follow up... ~  GYN:  She had Lap hyst & BSO 2007 by DrSkinner at [facility] ~  Immuniz:  She had Pneumovax in the past; she declines Flu shots and Tetanus shots...   Past Surgical History  Procedure Laterality Date  . 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 Appendectomy    . Cholecystectomy  1982  . [place]ar[place]rectomy  [place]    [place]  . Bilateral [place]oph[place]omy  [place]    [place]  . Laparoscopic gastric banding  08/2007    Dr. Hassell Done    Patient's Medications >> as of 07/02/14...  Previous Medications   ALBUTEROL (PROAIR HFA) 108 (90 BASE) MCG/ACT INHALER    Inhale 2 puffs into the lungs every 6 (six) hours as needed.   ALBUTEROL (PROVENTIL HFA;VENTOLIN HFA) 108 (90 BASE) MCG/ACT INHALER    Inhale 2 puffs into the lungs every 6 (six) hours as needed for wheezing or shortness of breath.   ALPRAZOLAM (XANAX) 0.5 MG TABLET    TAKE 1/2 TO 1 TABLET BY MOUTH 3 TIMES DAILY AS NEEDED FOR NERVES. NOT TO EXCEED 3 PER DAY   B COMPLEX-C-FOLIC ACID (B-COMPLEX BALANCED) TABS    Take 1 tablet by mouth daily.   BEE POLLEN 1000  MG TABS    Take 2 tablets by mouth daily.   CETIRIZINE (ZYRTEC) 10 MG TABLET    Take 10 mg by mouth daily.     CHOLECALCIFEROL (VITAMIN D-3) 5000 UNITS TABS    Take 1 tablet by mouth daily.   COCONUT OIL 1000 MG CAPS    Take 4 capsules by mouth daily.   COENZYME Q10 (COQ10) 100 MG CAPS    Take 1 tablet by mouth daily.   CYCLOBENZAPRINE (FLEXERIL) 10 MG TABLET    Take 1 tablet (10 mg total) by mouth 3 (three) times daily as needed for muscle spasms.   FLUTICASONE-SALMETEROL (ADVAIR) 500-50 MCG/DOSE AEPB    Inhale 1 puff into the lungs 2 (two) times daily.   KRILL OIL 500 MG CAPS    Take 1 capsule by mouth daily.   LEVALBUTEROL (XOPENEX) 1.25 MG/3ML NEBULIZER SOLUTION    Take 1.25 mg by nebulization 3 (three) times daily.   MAGNESIUM 400 MG CAPS    Take 2 capsules by mouth daily.   NAPROXEN SODIUM (ANAPROX) 220 MG TABLET    Take 2 tabs by mouth once daily    TIOTROPIUM (SPIRIVA HANDIHALER) 18 MCG INHALATION CAPSULE    PLACE 1 CAPSULE (18 MCG TOTAL) INTO INHALER AND INHALE DAILY.   TRAMADOL (ULTRAM) 50 MG TABLET    TAKE 1 TABLET BY MOUTH  3 TIMES DAILY   AS NEEDED FOR PAIN   VITAMIN C (ASCORBIC ACID) 250 MG TABLET    Take 250 mg by mouth daily.    Allergies  Allergen Reactions  . Penicillins     REACTION: nausea and vomiting    Current Medications, Allergies, Past Medical History, Past Surgical History, Family History, and Social History were reviewed in [employer] record.   Review of Systems         See HPI - all other systems neg except as noted... The patient complains of dyspnea on exertion and difficulty walking; chest discomfort, & leg weakness.  The patient denies anorexia, fever, weight loss, weight gain, vision loss, decreased hearing, hoarseness, syncope, prolonged cough, headaches, hemoptysis, abdominal pain, melena, hematochezia, severe indigestion/heartburn,  hematuria, incontinence, suspicious skin lesions, transient blindness, depression, unusual weight  change, abnormal bleeding, enlarged lymph nodes, and angioedema.    Objective:   Physical Exam    WD, Morbidly Obese, 64 y/o WF in no acute distress but c/o dyspnea/ wheezing... GENERAL:  Alert & oriented; pleasant & cooperative... HEENT:  Douds/AT, EOM-wnl, PERRLA, EACs-clear, TMs-wnl, NOSE-clear, THROAT-clear & wnl. NECK:  Supple w/ fairROM; no JVD; normal carotid impulses w/o bruits; no thyromegaly or nodules palpated; no lymphadenopathy. CHEST:  no accessory musc use, decr BS bilat, no wheezing/ rales/ rhonchi... HEART:  Regular Rhythm; without murmurs/ rubs/ or gallops heard... ABDOMEN:  Obese, soft & nontender; normal bowel sounds; no organomegaly or masses detected. EXT: without deformities, mod arthritic changes; no varicose veins/ +venous insuffic/trace edema. NEURO:  CNs intact; no focal neuro deficits... DERM: few ecchymoses, no rash etc...  RADIOLOGY DATA:  Reviewed in the EPIC EMR & discussed w/ the patient...  LABORATORY DATA:  Reviewed in the EPIC EMR & discussed w/ the patient...   Assessment & Plan:    OSA>  As prev noted she needs new sleep study & appropriate new CPAP apparatus to facilitate compliance but she declines the repeat study or sleep med referral...  COPD/ Emphysema>  Acute exac 2/16 improved after hosp in [place] w/ Levaquin, Pred taper, NEBS Bid, Avair500, Spiriva, Mucinex etc... Encouraged to stay on her O2 regularly & we will add Humidity, needs max nasal hygiene vs ENT follow up; continue NEBS/ Advair500/ Spiriva regularly & use the Mucinex; we will request the [facility] home Care home COPD program; needs to lose wt & incr exercise... 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 5/16>  She has seen ENT & Pulm 2nd opinion is pending; she cannot manipulate the port O2 tanks and we will request an INOGEN One port concentrator  for her use...   Hx CP w/ neg cardiac evals including prev Myoviews; 2DEchos w/o incr PA pressures... 4/15> she had Cardiology consult for eval of AtypCP [name]); EKG was WNL & 2DEcho showed norm LVF, Gr1DD & norm RV & PA pressures... 2/16> repeat [facility]cho in [facility] showed similar good LVF, valves and norm RV...  OBESITY>  She had remote lap band surg by DrMMartin; Met syndrome, must get wt down etc; she is a lap band failure & prev counseling by [name]... 3/16> weight down to 255# post hosp... 4/16> weight is back up to 271# despite all efforts...  ? Hx Hypothy>  TFTs remain normal off meds- no problem...  GI>  GERD> she denies symptoms & uses OTC meds prn...  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  Dysthymia>  Much stress in her life, on Alpraz prn; has embraced alt lifestyle...   Patient's Medications  New Prescriptions   No medications on file  Previous Medications   ALBUTEROL (PROAIR HFA) 108 (90 BASE) MCG/ACT INHALER    Inhale 2 puffs into the lungs every 6 (six) hours as needed.   ALBUTEROL (PROVENTIL HFA;VENTOLIN HFA) 108 (90 BASE) MCG/ACT INHALER    Inhale 2 puffs into the lungs every 6 (six) hours as needed for wheezing or shortness of breath.   ALPRAZOLAM (XANAX) 0.5 MG TABLET    TAKE 1/2 TO 1 TABLET BY MOUTH 3 TIMES DAILY AS NEEDED FOR NERVES. NOT TO EXCEED 3  PER DAY   B COMPLEX-C-FOLIC ACID (B-COMPLEX BALANCED) TABS    Take 1 tablet by mouth daily.   BEE POLLEN 1000 MG TABS    Take 2 tablets by mouth daily.   CETIRIZINE (ZYRTEC) 10 MG TABLET    Take 10 mg by mouth daily.     CHOLECALCIFEROL (VITAMIN D-3) 5000 UNITS TABS    Take 1 tablet by mouth daily.   COCONUT OIL 1000 MG CAPS    Take 4 capsules by mouth daily.   COENZYME Q10 (COQ10) 100 MG CAPS    Take 1 tablet by mouth daily.   CYCLOBENZAPRINE (FLEXERIL) 10 MG TABLET    Take 1 tablet (10 mg total) by mouth 3 (three) times daily as needed for muscle spasms.    FLUTICASONE-SALMETEROL (ADVAIR) 500-50 MCG/DOSE AEPB    Inhale 1 puff into the lungs 2 (two) times daily.   KRILL OIL 500 MG CAPS    Take 1 capsule by mouth daily.   LEVALBUTEROL (XOPENEX) 1.25 MG/3ML NEBULIZER SOLUTION    Take 1.25 mg by nebulization 3 (three) times daily.   MAGNESIUM 400 MG CAPS    Take 2 capsules by mouth daily.   NAPROXEN SODIUM (ANAPROX) 220 MG TABLET    Take 2 tabs by mouth once daily    TIOTROPIUM (SPIRIVA HANDIHALER) 18 MCG INHALATION CAPSULE    PLACE 1 CAPSULE (18 MCG TOTAL) INTO INHALER AND INHALE DAILY.   TRAMADOL (ULTRAM) 50 MG TABLET    TAKE 1 TABLET BY MOUTH  3 TIMES DAILY   AS NEEDED FOR PAIN   VITAMIN C (ASCORBIC ACID) 250 MG TABLET    Take 250 mg by mouth daily.  Modified Medications   No medications on file  Discontinued Medications   No medications on file

## 2014-07-04 ENCOUNTER — Telehealth: Payer: Self-pay | Admitting: Pulmonary Disease

## 2014-07-04 ENCOUNTER — Other Ambulatory Visit: Payer: Self-pay | Admitting: Pulmonary Disease

## 2014-07-04 DIAGNOSIS — J449 Chronic obstructive pulmonary disease, unspecified: Secondary | ICD-10-CM

## 2014-07-04 NOTE — Telephone Encounter (Signed)
[place]- Needs to talk to nurse regarding the simply go mini Please call at [phone]

## 2014-07-04 NOTE — Telephone Encounter (Signed)
Spoke with Rebecca [name], states that SN typed up a letter to give to [facility], stating that pt needs specifically an inogen 1. [facility] does not carry this device.  They can evaluate pt for a Simply Go Mini- this is the smallest POC that they carry. [facility] also requests that we call patient ahead of time to let them know that this is being ordered for her.    Dr. Lenna Gilford are you ok with this change?  Thanks!

## 2014-07-04 NOTE — Telephone Encounter (Signed)
Spoke with pt, states [facility] is refusing to bring any e-tanks.  Had 6 brought to her 2 weeks ago. States [facility] is saying that she has to bring the tanks to her, which she says she is not capable of doing.    Called [facility] and spoke with [facility], states there is an alert on the account stating that pt is refusing to use concentrator and will only use e-tanks.  .  Pt called [facility] yesterday to get more tanks delivered, was told that she would have to bring in these tanks as she is using them incorrectly. The "yellow light" came on her concentrator which means that her tubing has been on too long Respiratory therapist tried to talk patient through changing tubing, pt got upset and hung up.    Spoke with respiratory therapist at [facility], states they are not to deliver tanks as she has a home fill system.  Pt has a concentrator so she is not out of 02.  RT has offered to come out to home to evaluate to see if there is another system that would better work for pt, but she has refused this service.  RT does not have any other suggestions to offer but is open to suggestions on how to fix this chronic issue.  SN please advise on how you'd like to proceed.  Thanks!

## 2014-07-04 NOTE — Telephone Encounter (Signed)
Libby and SN are aware that [facility] does not carry this specific concentrator. [facility] is working on getting the pt this concentrator without using [facility]. Nothing further is needed at this time.

## 2014-07-04 NOTE — Telephone Encounter (Signed)
lmtcb for [name].  

## 2014-07-04 NOTE — Telephone Encounter (Signed)
Spoke with SN and [facility] in regards to this message. Per SN, order was placed for a specific POC called Inogen 1. SN feels that  Bu ordering this concentrator, it will alleviate the problems pt is having with [facility]. Order in system and letter was given to [facility] written by SN stating the necessity of this concentrator. Golden Circle stated that she would contact pt to let her know that we are working on this for her. Nothing else is needed at this time.

## 2014-07-09 DIAGNOSIS — H9313 Tinnitus, bilateral: Secondary | ICD-10-CM | POA: Diagnosis not present

## 2014-07-09 DIAGNOSIS — J343 Hypertrophy of nasal turbinates: Secondary | ICD-10-CM | POA: Diagnosis not present

## 2014-07-09 DIAGNOSIS — Z87891 Personal history of nicotine dependence: Secondary | ICD-10-CM | POA: Diagnosis not present

## 2014-07-09 DIAGNOSIS — J449 Chronic obstructive pulmonary disease, unspecified: Secondary | ICD-10-CM | POA: Diagnosis not present

## 2014-07-09 DIAGNOSIS — J3489 Other specified disorders of nose and nasal sinuses: Secondary | ICD-10-CM | POA: Diagnosis not present

## 2014-07-09 DIAGNOSIS — M950 Acquired deformity of nose: Secondary | ICD-10-CM | POA: Diagnosis not present

## 2014-07-09 DIAGNOSIS — R0981 Nasal congestion: Secondary | ICD-10-CM | POA: Diagnosis not present

## 2014-07-09 DIAGNOSIS — H903 Sensorineural hearing loss, bilateral: Secondary | ICD-10-CM | POA: Diagnosis not present

## 2014-07-10 ENCOUNTER — Encounter: Payer: Self-pay | Admitting: Family Medicine

## 2014-07-10 ENCOUNTER — Ambulatory Visit (INDEPENDENT_AMBULATORY_CARE_PROVIDER_SITE_OTHER): Payer: Medicare Other | Admitting: Family Medicine

## 2014-07-10 DIAGNOSIS — M25572 Pain in left ankle and joints of left foot: Secondary | ICD-10-CM | POA: Insufficient documentation

## 2014-07-10 DIAGNOSIS — Z0289 Encounter for other administrative examinations: Secondary | ICD-10-CM

## 2014-07-10 MED ORDER — KETOROLAC TROMETHAMINE 60 MG/2ML IM SOLN
60.0000 mg | Freq: Once | INTRAMUSCULAR | Status: AC
  Administered 2014-07-10: 60 mg via INTRAMUSCULAR

## 2014-07-10 NOTE — Patient Instructions (Signed)
Good to see you Look =like a gout flare.  Shot today Then starting tomorrow indomethacin 3 times a day for 3 days Ibuprofen 3 times a day for 3 days Then diclofenac 3 times a day for 3 days Stop the licorice.  See me again in 10 days.

## 2014-07-10 NOTE — Assessment & Plan Note (Signed)
Patient's presentation and timeline appears to be more secondary to a gouty flare. Patient is taking tramadol without any significant improvement is likely the reason it is not helping as well. Patient does have diabetes as well as significant COPD's we want to avoid steroids at this time. Patient will be put on a short course of anti-inflammatories. Patient will hold her over-the-counter anti-inflammatories. Patient given a shot of Toradol today to help with some relief. Patient will continue to monitor her diet. Patient and will come back and see me again in 10 weeks for further evaluation. No significant improvement then we'll consider further imaging but I do not think that this is necessary. No signs of a blood clot at this time. Patient does have shortness of breath but states that this is her baseline.  Spent  25 minutes with patient face-to-face and had greater than 50% of counseling including as described above in assessment and plan.

## 2014-07-10 NOTE — Progress Notes (Signed)
[name] [address] [address], [place] [address] Phone: [phone] Subjective:     CC: Left ankle pain  UTM:[id number] Rebecca [name] is a 64 y.o. female coming in with complaint of patient states that over the course last several weeks she started having some mild increased swelling of the left ankle but did not have any pain. Patient states though that the pain is starting to hurt her significant doing more now. Patient states this started in the last 48 hours. Patient did start a new vitamin and since then has had this redness of her ankle. Patient denies any fevers or chills or any abnormal weight loss. States that even light touch can cause severe pain. Rates the severity of 9 out of 10. Is walking with crutches. Denies any injury.     Past medical history, social, surgical and family history all reviewed in electronic medical record.   Review of Systems: No headache, visual changes, nausea, vomiting, diarrhea, constipation, dizziness, abdominal pain, skin rash, fevers, chills, night sweats, weight loss, swollen lymph nodes, body aches, joint swelling, muscle aches, chest pain, shortness of breath, mood changes.   Objective There were no vitals taken for this visit.  General: No apparent distress alert and oriented x3 mood and affect normal, dressed appropriately. Very uncomfortable HEENT: Pupils equal, extraocular movements intact  Respiratory: Significant shortness of breath after walking. Patient is on oxygen Cardiovascular: No lower extremity edema, non tender, no erythema  Skin: Redness on the medial aspect of the right ankle. Abdomen: Soft nontender  Neuro: Cranial nerves II through XII are intact, neurovascularly intact in all extremities with 2+ DTRs and 2+ pulses.  Lymph: No lymphadenopathy of posterior or anterior cervical chain or axillae bilaterally.  Gait normal with good balance and coordination.  MSK:  Non tender with full range of  motion and good stability and symmetric strength and tone of shoulders, elbows, wrist, hip, knee bilaterally. Moderate osteophytic changes of multiple joints. Ankle: Left tray swelling noted redness on the medial aspect of the ankle Range of motion is full in all directions. Strength is 5/5 in all directions. Stable lateral and medial ligaments; squeeze test and kleiger test unremarkable; Talar dome nontender; severe tenderness over the medial aspect of the ankle. No pain at base of 5th MT; No tenderness over cuboid; No tenderness over N spot or navicular prominence No tenderness on posterior aspects of lateral and medial malleolus No sign of peroneal tendon subluxations or tenderness to palpation Negative tarsal tunnel tinel's Cannot walk without crutches Contralateral ankle unremarkable  MSK US performed of: Left ankle This study was ordered, performed, and interpreted by Charlann Boxer D.O.  Foot/Ankle:   All structures visualized.   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 Peroneus longus and brevis tendons unremarkable on long and transverse views without sheath effusions. Posterior tibialis minimal hypoechoic changes, flexor hallucis longus, and flexor digitorum longus tendons unremarkable on long and transverse views without sheath effusions. Achilles tendon visualized along length of tendon and unremarkable on long and transverse views without sheath effusion. Anterior Talofibular Ligament and Calcaneofibular Ligaments unremarkable and intact. Deltoid Ligament unremarkable and intact. Plantar fascia intact and without effusion, normal thickness. No increased doppler signal, cap sign, or thickening of tibial cortex. Power doppler signal normal.  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

## 2014-07-10 NOTE — Addendum Note (Signed)
Addended by: Douglass Rivers T on: 07/10/2014 01:34 PM   Modules accepted: Orders

## 2014-07-13 ENCOUNTER — Telehealth: Payer: Self-pay | Admitting: Family Medicine

## 2014-07-13 MED ORDER — COLCHICINE 0.6 MG PO CAPS: ORAL_CAPSULE | ORAL | Status: DC

## 2014-07-13 NOTE — Telephone Encounter (Signed)
States foot is not any better.  She would like to come in this morning.  She has an appointment today from 11:00 to 12:00 with another doctor.  States meds are not working.

## 2014-07-13 NOTE — Telephone Encounter (Signed)
Called pt back, unable to leave a message. Colchicine sent into pharmacy.

## 2014-07-13 NOTE — Telephone Encounter (Signed)
Unable to do this. I have a meeting. Patient is welcome to get colchicine twice daily for the next 5 days #10. We can also do prednisone if she continues to have difficulty. As patient if she would like one or both of these medications.

## 2014-07-16 ENCOUNTER — Telehealth: Payer: Self-pay | Admitting: Pulmonary Disease

## 2014-07-16 ENCOUNTER — Ambulatory Visit (INDEPENDENT_AMBULATORY_CARE_PROVIDER_SITE_OTHER): Payer: Medicare Other | Admitting: Pulmonary Disease

## 2014-07-16 ENCOUNTER — Encounter: Payer: Self-pay | Admitting: Pulmonary Disease

## 2014-07-16 ENCOUNTER — Other Ambulatory Visit (INDEPENDENT_AMBULATORY_CARE_PROVIDER_SITE_OTHER): Payer: Medicare Other

## 2014-07-16 VITALS — BP 132/80 | HR 82 | Temp 97.2°F | Resp 16

## 2014-07-16 DIAGNOSIS — M25572 Pain in left ankle and joints of left foot: Secondary | ICD-10-CM | POA: Diagnosis not present

## 2014-07-16 DIAGNOSIS — M150 Primary generalized (osteo)arthritis: Secondary | ICD-10-CM

## 2014-07-16 DIAGNOSIS — J9611 Chronic respiratory failure with hypoxia: Secondary | ICD-10-CM | POA: Diagnosis not present

## 2014-07-16 DIAGNOSIS — J449 Chronic obstructive pulmonary disease, unspecified: Secondary | ICD-10-CM

## 2014-07-16 DIAGNOSIS — M159 Polyosteoarthritis, unspecified: Secondary | ICD-10-CM

## 2014-07-16 LAB — MAGNESIUM: MAGNESIUM: 2.2 mg/dL (ref 1.5–2.5)

## 2014-07-16 LAB — BASIC METABOLIC PANEL
BUN: 15 mg/dL (ref 6–23)
CALCIUM: 9 mg/dL (ref 8.4–10.5)
CO2: 31 mEq/L (ref 19–32)
CREATININE: 1.02 mg/dL (ref 0.40–1.20)
Chloride: 106 mEq/L (ref 96–112)
GFR: 57.66 mL/min — ABNORMAL LOW (ref >60.00)
GLUCOSE: 90 mg/dL (ref 70–99)
POTASSIUM: 4.2 meq/L (ref 3.5–5.1)
Sodium: 141 mEq/L (ref 135–145)

## 2014-07-16 LAB — URIC ACID: URIC ACID, SERUM: 6.6 mg/dL (ref 2.4–7.0)

## 2014-07-16 MED ORDER — METHYLPREDNISOLONE 8 MG PO TABS: ORAL_TABLET | ORAL | Status: DC

## 2014-07-16 MED ORDER — METHYLPREDNISOLONE ACETATE 80 MG/ML IJ SUSP
120.0000 mg | Freq: Once | INTRAMUSCULAR | Status: AC
  Administered 2014-07-16: 120 mg via INTRAMUSCULAR

## 2014-07-16 NOTE — Progress Notes (Signed)
HPI  Review of Systems  Physical Exam  Patient ID: Rebecca Rebecca [name], female   DOB: [birth year], 64 y.o.   MRN: [id number] Subjective:   Patient ID: Rebecca Rebecca [name], female    DOB: [birth year], 64 y.o.   MRN: [id number]  HPI 64 y/o WF known to me w/ mult med problems as noted below...  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  LABS 3/15:  Chems- BS=95 A1c=6.8 (needs low carb diet, exercise, wt loss) & Creat=1.5 (mild renal insuffic due to Lasix rx; Rec to decrease Lasix40 to 1 tab Qam...  2DEcho 4/15 showed norm LV sys function w/ EF=60-65%, normal wall motion, Gr1DD, normal valves, normal RV & PA pressures...  ADDENDUM>>  she had Cards eval Rebecca Rebecca [name] 4/15> he did not feel that invasive testing was warranted; she had 2DEcho but refused Myoview due to co-pay costs...   ~  November 07, 2013:  27moROV & Rebecca Rebecca [name] reports "I'm doing great, just a little arthritis in my mouse hand"; she also relates a lot of grief over the loss of her 9y/o Rebecca dog [name] who ran away several mo ago;  She reports that she participated in a medical study out of [place] regarding spacers for folks w/ COPD using MDIs, no ch in her meds required;  She remains on ADVAIR500Bid, SPIRIVA daily, & ProairHFA prn (uses 3-4 x daily "It really helps");  She has Home O2 but dislikes the Liq O2 therefore not using & she wants a portable O2 concentrator to read 4L/min w/ exercise, 2L/min at rest;  She still doesn't like the nasal cannula, doesn't feel that it gets into her system but like the face mask attachment because she can feel it Rebecca [name]- of course we discussed all these issues (again)...     She has continued to research her supplements and herbal meds>  She tells me that she is living on smoothies she makes out of juice, ice, baking soda ("only a tiny bit"), pomegranate syrup, & local honey;  Tumeric helps the  arthritis in her knees and she has decreased the amt of aleve she was using;  She notes that pinching her upper lip in the middle under her nose helps relieve musc cramps in her legs;  She states that she hasn't had any palpit or skip beats since starting on a Magnesium supplement...    She still sees Rebecca Rebecca [name] for help w/ her eating disorder but was a bit upset that he didn't have a diet handout for her;  We gave her the papers we have here to go by... We reviewed prob list, meds, xrays and labs> see below for updates >> she declines the 2015 Flu vaccine today...  Ambulatory O2 Test 9/15>  O2 sat on RA at rest= 92% w/ pulse=86;  Nadir O2sat on RA after 2 laps= 84% w/ pulse=96...   ~  November 17, 2013:  Rebecca Delaypresents for an urgent add-on appt (refused to go to the ER) w/ acute SOB; she called yest c/o chest tightness, cough, sm amt of whitish sput & wanted something to "dry me up" (given antihist)- she refused antibiotics and Prednisone;   drove herself to the office where she was found to be tachypneic w/ RR=24, hypoxemic on RA w/ O2sat=83%; she denies f/c/s, discolored sput, CP; chest exam w/ decr BS, exp wheezing & using accessory muscles;  dry cough, no rales or consolidation...    RX> placed on O2 via mask w/ improvement in sats to 93%; given NEB treatment w/ Xopenex 0.63 x2; given Depo120 as well => improved w/ Rebecca [name] air movement, decr wheezing, and O2sat up to 97%    REC> to change Advair500 to NEBS w/ ALBUT2.65m Qid (?Xopenex not covered)/ BUDES0.559mBid; Pred 2044m4d tapering sched, Mucinex 1200m40md & fluids... Continue Spiriva daily and home Oxygen... We reviewed prob list, meds, xrays and labs> see below for updates >> reminded of low carb, no sweets, get wt down...  CXR today showed COPD/emphysema, no acute abnormality...  LABS 9/15:  Chems- ok w/ BS=119, A1c=6.7, Cr=1.4;  CBC- ok...  ~  December 15, 2013:  80mo 36mo& Rebecca Rebecca [name] Rebecca Delayuch improved on the Pred taper=> now down to 1/2 Qod but she  wants off, feels worse on the day she takes it she says; advised slow taper to 1/2 Q3d til gone, and reminded to use the Xopenex/Budes NEB Bid regularly, and the Mucinex 1200mg 73mw/ Fluids...  She tells me that she has had some recent dental problems and inquired about a state sponsored dental clinic option but there were no openings unless her doctor wrote a letter indicating that her dental prob was contrib to her medical illness etc=> letter written & given to pt per request...  She has been using the Xanax but feels it is too strong for her- even 1/2 tab at bedtime gives her a hangover & she is advised to further decr the dose until she finds the best combination of rest & no morning side effect... Plan ROV 54mo.  1080moecember 17, 2015:  54mo ROV67moil is cArtis Delayleft arm pain- shoulder down to the wrist "it's a nerve" she says; discomfort present for 2 wks, can't sleep, "hurt so much I cried", tried OTC meds, horse linament, Tramadol, Hydrocodone & none w/ relief; using heating pad which helps some, she denies neck pain or decr ROM=> we discussed needed referral [facility] [place] [facility]...     She has weaned off the Pred as noted above but she also stopped the Xopenex/ Budesonide via NEB because it was too expensive; she is back on her Advair500-2spBid & Spiriva daily; she has Albuterol2.5 for the Nebulizer to use prn; she continues to take Mucinex1200Bid + Fluids; she notes that her breathing is stable- DOE w/ ADLs and no real improvement, she has not been able to exercise, or diet, and weight is up to 274# w/ BMI~44;  She still desaturates w/ activity on RA but she refuses to wear the oxygen regularly as prescribed- states she uses it intermittently if SOB & take it off 5-120 min later when she recovers;  She has not yet had her dental work;  Zoloft seems to be helping her depression but she does not want to increase from 50mg to 59mg/d;  39mis still upset at Rebecca Rebecca [name] [facility] about her dog  Rebecca [name] who ran away earlier this yr...     We reviewed prob list, meds, xrays and labs> see below for updates >> she refuses the 2015 flu vaccine....   ~  May 02, 2014:  2-67mo ROV & 680mo hosp check>  [place] [facility] COPD exac ([place] Lev[place], Pred taper, NEBS w/ Xopenex, and continued on her Advair500Bid, Spiriva daily, Mucinex, Lasix40 prn >>  2/16 [facility] records indicated a thorough evaluation & excellent care provided but treatment plan was hampered by noncompliance- refused CPAP, declines chestPT, alternately stopping her O2 & demanding more O2,   LABS 2/16:  ABG- pH=7.46, pCO2=35, pO2=134 on 6L/min; Chems- ok x BS=169, A1c=6.5; CBC- normal; TSH=0.39 & FreeT4=1.54; Troponin/ D-dimer/ BNP- all wnl...  CXR 2/16 showed heart at upper lim of norm, hyperinflation, min bibasilar atx, NAD.Marland KitchenMarland Kitchen   EKG 2/16 showed NSR, rate88, low voltage, otherw wnl...  2DEcho 2/16 showed norm LVF w/ EF=55-60%, Gr1DD, norm valves and norm RV...  We reviewed the following medical problems during today's office visit >>     Hx mild OSA w/ mod desat, loud snoring, & leg jerks (on sleep study 2005)> she tried CPAP10 but stopped on her own & declined to repeat split night study & re-try CPAP rx; she says she's sleeping satis & wakes feeling refreshed...    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 1993; supposed to be on HomeO2- 2L rest & 4L exercise, NEBS w/ AlbutQid, Advair500Bid, Spiriva daily, Mucinex1-2Bid but not taking anything regularly; states that Flutter & Mucinex cause "spasms" in her back; states O2 doesn't help since she can't breathe thru her nose (she has been eval by ENT, [name]); finally tried [employer] but stopped after 28moas "fruitless"; prev stated she "can't breathe at all, can't do anything" etc; SOB w/ ADLs etc; Note> [facility] and last PFT (2011) showed FEV1= 1.5 (55%) & %1sec=59 ==> 3/15  she announced that she's been cured by taking a Magnesium supplement, along w/ herbal formula "Clear lungs" and "Lung tonic"; she had stopped her Oxygen;  Now using O2 up to 15L/min on her own since she can't breathe thru her nose- advised 2L/min rest & 4L/min exercise + humidity, nasal hygiene, etc;  Keep Pred 287md for now & ROV 3wks...    Hx AtypCP> baseline EKG w/ minor NSSTTWA; 2DEcho w/ norm LVF, EF=55-60%, Gr1DD, norm valves, norm LA/RV; Myoview 2009 normal w/o ischemia & EF=55%; she notes some atypCP & saw DrCherly Rebecca [name]/15, she declined Myoview.    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; she knows not to eat sodium, elev legs, wear support hose; Labs 2/16 showed Cr=1.2-3    Impaired Gluc Tolerance, Metabolic syndrome> she refused meds; prev eval by DrEllison w/ rec for Metformin Rx but she rejects the DM diagnosis & refused meds; states "I'm eating healthier" (advised low carb low fat); Labs 2/16 showed BS~160, A1c=6.5...Marland KitchenMarland Kitchen  ?Hypothyroid> not currently on meds but she really wants thyroid Rx; prev TFTs all wnl; she prev had Endocrine in [place] prescribe Synthroid for her but she stopped going; clinically & biochemically euthyroid; Labs 2/16 showed TSH=0.39 & FreeT4=1.54    Morbid Obesity> peak weight ~340# before lap band surg 2009 by DrMMartin; lost to ~250# but then back up to 280-90#; refused f/u w/ [facility] due to cost of visits & lap-band adjustments; 3/16 weight isdown to 255# & we reviewed diet & exercise...    GI- GERD, IBS> she uses OTC PPI as needed; last colonoscopy was 1982 by DrPatterson & wnl, she refuses GI referral & refuses f/u colon etc...    GYN- s/p uterine cancer> [facility]    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 PLAN>>  Asked to continue Pred20/d for now, Advair500Bid, Spiriva daily, NEBS w/ Xopenex Bid-Tid, Mucinex600-2Bid, fluids; we will ask DME for humidity on her O2 & switch to pulse dose where able; she is c/o nasal dryness & we reviewed nasal regimen/hygiene; we offered second/third opinions (eg-DrKozlow), offered Hospice referral, but we ultimately decided on referral to [facility] COPD program... ROV in 3 wks... Note> [facility] & [facility]; Hospice required home-bound status and [place]; [facility] to set up humidity & pulse-dose oxygen...   ~  May 21, 2014:  3wk ROV & recheck> Rebecca Delayspent most of this 270m ROV ventilating about her frustration over [facility], her oxygen, the Prednisone, her sister, and life in general; In the interval she developed "ROID RAGE" because of her Pred Rx at 2039m- her breathing was Rebecca [name] but she flew into a rage & discussed this w/ her psychologist, Rebecca Rebecca [name]- then called us [place] instructed to wean down the Pred; now on 3m29mand she is much Rebecca [name] but she states "I want off" and we outlined a slow weaning schedule for her over the next month;  She also reports an interval trip to the ER w/ constipation...     OSA> she has declined a repeat sleep study, using O2 at night- 2L/min by [place]...    COPD/ Emphysema> on Pred taper (now 3mg72m Advair500Bid, Spiriva daily, Xopenex NEBS Tid, Ventolin HFA prn; based on PFTs in 2011 she had GOLD Stage 2 COPD, patient group B, but has clearly deteriorated over the last few yrs; [place] and she appears improved at present but declines to do f/u PFT at this time; she is very frustrated w/ [facility]- we have tried to get humidifiers set up & pulse-dose  apparatus on her portable equipment (OK to adjust pulse-dose O2 to 3L/min rest & 5L/min exercise)...  We reviewed prob list, meds, xrays and labs> see below for updates >>  PLAN>> We will again try to get [facility] her humidity & pulse-dose oxygen (OK'd for 3-5L/min flow);  Wean Pred according to slow taper printed on the AVS; ROV in 6wks w/ PFT...  ~  June 05, 2014:  2wk ROV & add-on for SOB> Rebecca Rebecca [name] Rebecca Delaylways SOB due to her severe GOLD stage 3-4 COPD and her CC is "my life is miserable, I hate my life" she is angry at the world- her family (esp sister), [employer], Medicare, all of her doctors, etc... Today she focuses on not being able to breathe thru her nose & reminds me that [name] wanted to operate but I assessed her operative risk too high; says she can't use her oxygen due to this issue & [facility] has been unable to satisfy her needs in this area (she states that shje has to wait another 21mo 43moe Medicare will permit changing DME providers)... She is quite tearful but still able to talk w/ loud volume and full sentences;  She has mild cough, sm amt beige sput, no hemoptysis, chr stable/ severe SOB/DOE w/ ADLs and any activity, difficulty caring for her pets...     OSA> she has declined a repeat sleep study, using O2 at night- 2L/min by [place],  occas puts it in her mouth...    COPD/ Emphysema> on Pred taper (now 20m/d), Advair500Bid, Spiriva daily, Xopenex NEBS Tid, Ventolin HFA prn; based on PFTs in 2011 she had GOLD Stage 2 COPD, patient group B, but has clearly deteriorated over the last few yrs; [place] and she appears to be stable at present; she agreed to repeat Spirometry today> c/w GOLD Stage 3-4 COPD (see below)... EXAM shows Afeb, VSS, O2sat=90% on RA in office;  HEENT- nasal congestion, erythema, turbinate hypertrophy, dev septum, throat= Mallampati 2, sl red, no exudates or lesions;  Chest- decr BS bilat w/o w/r/r & no signs of consolidation;  Ext- VI w/  trace edema;  Psyche- very distraught...  Last CXR 2/16 showed heart at upper lim of norm, hyperinflation, min bibasilar atx, NAD  Spirometry 06/2014 showed FVC=1.61 (50%), FEV1=0.88 (35%), %1sec=55, mi-flows are reduced to 18% predicted... 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 PLAN>>  We discussed referral to medical center to consider ENT eval to help her nasal obstruction, & eval by pulmonary team to see if they can think of anything else to help this nice lady...  ~  Jul 02, 2014:  147moOV & Rebecca Rebecca [name] reports good days and bad- today is a good day & she thinks maybe because she didn't take Xanax last PM (she will investigate);  [facility] are [facility] to have to do surg, now on nasal srays + Saline & an ointment, they removed ear wax & plan hearing eval soon;  She is using a new head-set w/ microphone-like oxygen delivery device instead of nasal cannula;  She notes some back spasms and using her Flexeril;  She brought a copy of COPD Digest & an article that states if a Medicare pt requires a different O2 delivery system, then the DME must comply- she is unable to use the mult portable tanks due to weight, awkward maneuvering, can't manipulate the controls, etc=> we will request an [employer] ONE portable concentrator for her...    She remains on Home O2 at 2L/min, Advair500Bid, Spiriva daily, Xopenex NEBS Tid, Ventolin HFA prn;  Chest is clear w/ decr BS bilat, no w/r/r;  Heart shows RR, no m/r/g... We reviewed prob list, meds, xrays and labs> see below for updates >>  PLAN>>  We will request the port oxygen concentrator for her use=> [facility] She will f/u w/ ENT & [facility]- ROV in 60m54mo  ~  Jul 16, 2014:  2wk ROV & add-on appt requested for mult issues, here w/ her friend PegVickii Chafeday... States she's been more SOB for the past week w/ thick mucus, left ankle pain & she goes into great detail about prev ankle sprain yrs  ago, but [name] Dx prob gout & rec Ibuprofen/ Indocin/ Colchicine but it's still sore w/ decr ROM- I told her we would need to send her to Ortho if Depo120, MEDROL taper (she won't take Pred due to "rage") didn't resolve the issue... She states that 2wks ago her breathing was the best it's been in years! But now c/o cough, congestion, incr SOB "it's so bad", says she's desat at home & increased her O2 to 8L/min but ran out of tanks fast! She's taking OTC Magnesium supplements; O2sat here is 93% on RA!!!  [facility] 2nd opinion [facility]  June... She tells me about a posting on Facebook about "6 deadly antibiotics" & Levaquin was one of them...     We reviewed prob list, meds, xrays and labs> see below for updates >> she wants a letter so the post office will bring mail to her door...  LABS 5/16:  Chems- wnl w/ BS=90, Cr=1.02;  Mg=2.2, Ca=9.0;  Uric=6.6.Marland KitchenMarland Kitchen Plan>>  We discussed Rx w/ Depo120, Medrol$RemoveBeforeDEI'8mg'SdEeqJCPJyAWGbiT$ - tapering sched, and to continue regular dosing w/ Home O2 at 2-3L/min, Advair500Bid, Spiriva daily, Xopenex NEBS Tid, Ventolin HFA prn, & Guaifenesin...            Problem List:    OBSTRUCTIVE SLEEP APNEA (ICD-327.23) - sleep study 2005 showed RDI=15 w/ desat to 78%, loud snoring & +leg jerks> on CPAP 10, prev used intermittently but now not at all... ~  11/12:  She has agreed to a new Sleep Study- split night protocol, to recheck her OSA & determine CPAP needs (check ABG if poss too)==> she never followed thru w/ the study. ~  Pt states that she is resting satis and wakes refreshed, energy Rebecca [name]... ~  12/15: she reports not resting due to left shoulder/ arm pain & we will refer to Ortho (she saw Dr. Gardenia Phlegm)... ~  She continues to refuse repeat sleep study, CPAP, and declines f/u w/ sleep med...  [facility] [place]- for chronic nasal congestion & obstruction> he is working to get her nares open to facilitate her breathing & nasal O2 Rx...   COPD/ EMPHYSEMA -  severe obstructive lung disease> ex-smoker, quit 1993... supposed to be on NEBULIZER Qid, ADVAIR 500Bid + SPIRIVA daily (compliance is a serious issue- she freq stops all meds) ==> she [facility] by DrAdair in 2006... ~  A1AT level is normal 218 (83-200) 3/09... ~  baseline CXR w/ borderline Cor, COPD, interstitial scarring/ atelectasis/ obesity... ~  PFTs 3/07 showed FVC=2.74 (82%), FEV1=1.67 (62%), %1sec=61, mid-flows=27%pred... improved from 2005. ~  CTChest in 2007 & 4/09 showed severe centrilob emyphysema, + interstitial fibrosis, sm HH, left renal cyst... neg for PE... ~  2010:  breathing improved w/ weight reduction after Lap-band surg... ~  2/11: COPD exac w/ neg CXR (chr changes, NAD), Rx w/ Depo/ Pred/ Avelox/ Mucinex/ NEBS/ Advair/ Spiriva/ etc... ~  PFTs 3/11 showed FVC= 2.57 (73%), FEV1= 1.51 (55%), %1sec=59, mid-flows= 21%pred. ~  Noncompliant w/ Rx & O2 thru 2012... On disability since 2011 & finally got Medicare coverage 9/12... ~  11/12: presents w/ worsening breathing & dyspnea w/ ADLs ==> we outlined further eval w/ 2DEcho, Sleep Study; Rx w/ NEBS QID, Advair500Bid, Spiriva daily, Mucinex, Oxygen, Lasix, Zoloft, Xanax, etc (she declined to proceed w/ the sleep study, 2DEcho, etc)... 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  ~  4/13: she is c/o the nasal O2 not helping since it's hard to breathe thru nose & wants ENT referral==> saw [name] who wanted to do surg but she has severe COPD & hi risk; asked to start PulmRehab & she agrees... ~  10/13: she did PulmRehab for 19mo 6-7/13 but quit since it was "fruitless"- barely got to do any exercise since it was so difficult getting to the facility,etc; still c/o nasal problems limiting her benefit from O2 & we discussed the poss of ?transtracheal oxygen? ~  9/14: exsmoker quit 1993; supposed to be on HomeO2- 2L rest & 4L exercise,  NEBS  w/ Albut, Advair500Bid, Spiriva daily, Mucinex1-2Bid but not taking anything regularly; states that Flutter & Mucinex cause "spasms" in her back; states O2 doesn't help since she can't breathe thru her nose (she has been eval by ENT, [name]); finally tried [employer] but stopped after 23moas "fruitless"; states she "can't breathe at all, can't do anything" etc; SOB w/ ADLs=> O2 recert today (see below)... Note> [facility] and last PFT (2011) showed FEV1= 1.5 (55%) & %1sec=59... OXYGEN RE-CERT done today... ~  3/15: see above- pt states that a magnesium supplement along w/ herbal supplements "clear lungs" and "lung tonic" have made all the difference & she no longer needs oxygen, exercising regularly & much improved; still taking Advair500, Spiriva, AlbutHFA prn...  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 ~  CXR 9/15 showed COPD/emphysema, no acute abnormality... ~  11/07/13: Ambulatory O2 Test 9/15> O2 sat on RA at rest= 92% w/ pulse=86;  Nadir O2sat on RA after 2 laps= 84% w/ pulse=96...  ~  11/17/13: she presented w/ acute SOB, wheezing, COPD exac> treated w/ NEBS, Depo120, Pred taper, Mucinex1200Bid, fluids, & continue the Spiriva & Oxygen; ch her Advair to NEBS w/ Xopenex & Budesonide regularly... ~  10/15: she is much improved w/ Pred taper but she wants off; reminded to use NEBS w/ Xopenex & Budes Bid every day... ~  12/15: she is off the Pred, but also stopped the Xopenex/ Budesonide due to cost; she has gone back on her AChisago plus Albut via NEBS, Mucinex/ Fluids/ etc... ~  [facility] COPD exac> disch on Oxygen, Pred taper, Levaquin750, NEBS w/ Xopenex, Advair500, Spiriva, Mucinex, etc... ~  3/16: post hosp check, very frustrated that she can't get things the way SHE wants them; c/o O2 since she can't breathe thru nose & we reviewed nasal hygiene,  huidified O2, ENT re-eval; on Pred taper, off Levaquin now, Advair500Bid, Spiriva daily, NEBS w/ Xopenex Bid, Mucinex (not taking regularly & asked to do 2Bid + fluids); we will [facility] & recheck pt in 3 weeks... ~  4/16: she continues to be very frustrated w/ her disease & her care- can't breathe thru her nose & she feels the O2 is not doing her any good; we [place] see if there is anything they can do.  ~  She [facility]Plonk[facility]  ATYPICAL CHEST PAIN (ICD-786.50) - eval by DrWall in 2009. ~  baseline EKG w/ NSR, PVC's, NSSTTWA, NAD... ~  2DEcho 2/05 showed mild MR/ TR, norm LA/ RV, EF=50-55%... ~  NuclearStressTest 4/09 was norm- no ischemia, EF=55%... [place]... ~  3/15: she notes some atypCP & requests a cardiac referral for eval- prev seen by DrWall, we will call for Cards consult per her request... ~  EKG 3/15 showed NSR, rate91, occas PVCs, otherw wnl... ~  4/15: she had Cards eval DrNishan> he did not feel that invasive testing was warranted; she had 2DEcho but refused Myoview due to co-pay costs...  ~  2DEcho 4/15 showed norm LV sys function w/ EF=60-65%, normal wall motion, Gr1DD, normal valves, normal RV & PA pressures... ~  EKG 2/16 showed NSR, rate88, low voltage, otherw wnl... ~  2DEcho 2/16 showed norm LVF  w/ EF=55-60%, Gr1DD, norm valves and norm RV.  VENOUS INSUFFICIENCY, CHRONIC (ICD-459.81) - Hx chr ven insuffic w/ edema... on LASIX 33m- 1-2 daily (she self medicates)... ~  Labs 3/15 showed Cr=1.5 and she is asked to decr the Lasix to 415mQam...  IMPAIRED GLUCOSE TOLERANCE >>  METABOLIC SYNDROME X (ICUMP-536.1- she is on diet alone (refused Metformin therapy)... ~  labs 9/08 showed BS=120... FBS 5/08 was 93, HgA1c=7.1 ~  labs 5/10 showed BS= 182, A1c= 6.4 ~  labs 2/11 showed BS= 76, A1c= 6.5 ~  Labs 5/12 showed BS= 103, A1c= 6.5 ~  She  saw DrEllison 4/13 w/ A1c=7.1 but she rejects the diagnosis of Diabetes & refused Metform50047m... ~  10/13:  We discussed IMPAIRED GLUCOSE TOLERANCE & need for low carb wt reducing diet and incr exercise... ~  9/14: he refused meds; prev eval by DrEllison w/ rec for Metformin Rx but she rejects the DM diagnosis & refused meds; states "I'm eating healthier" (advised low carb low fat), not checking sugars, prev labs w/ A1c=7.1  ~  3/15: on diet alone; Labs 3/15 showed BS= 95, A1c= 6.8 ~  9/15: on diet alone; Labs 9/15 showed BS= 119, A1c= 6.7 ~  3/16: on diet alone; Labs 2/16 in hosp showed BS~160, A1c=6.5  MORBID OBESITY (ICD-278.01) - she prev saw Rebecca Rebecca [name] for counselling about her eating disorder... ~  max weight ~340# before lap band surg 7/09 by DrMartin. ~  weight 12/09 = 291# ~  weight 5/10 = 265# ~  weight 11/10- = 254# ~  weight 2/11 = 260# ~  Weight 5/12= 290# ~  Weight 11/12 = 286# ~  Weight 4/13 = 280# => BMI=45 ~  Weight 10/13 = 289# ~  Weight 9/14 = 281# ~  Weight 3/15 = 270# ~  Weight 9/15 = 267# ~  Weight 12/15 = 274# ~  Weight 3/16 = 255# => post hosp... ~  Weight 4/16 = 271#  ? Hx HYPOTHYROIDISM (ICD-244.9) - off thyroid meds since 1/10... she was started on Synthroid and followed by DrRSmith in [place] & last seen 7/09- note reviewed... she refuses f/u by [name]- we will recheck TSH off Synthroid Rx... ~  labs 5/10 off Synthroid since 1/10 showed TSH= 1.13 ~  labs 2/11 showed TSH= 1.82 ~  Labs 5/12 showed TSH= 1.81 ~  2013: She wanted to restart Thyroid hormone but TSH remains normal> referred to Endocrine for further eval but she was upset w/ DrEllison's eval; she will decide if she wants to pursue another opinion... ~  9/14: not currently on meds but she really wants thyroid Rx; prev TFTs all wnl; she prev had Endocrine in [place] prescribe Synthroid for her but she stopped going; clinically & biochemically euthyroid...  ~  3/15: on Synthroid100-12 tab daily; Labs  showed TSH= 3.00 ~  She stopped the Synthroid again... ~  Labs 2/16 not on meds showed TSH=0.39 & FreeT4=1.54   GERD (ICD-530.81) - prev on Nexium but off all meds since 1/10... IRRITABLE BOWEL SYNDROME (ICD-564.1) - colonoscopy 1982 by DrPatterson was WNL... She has refused f/u GI eval & f/u colonoscopy...  ?KIDNEY STONE Hx >> pt said she passed a kidney stone 3/11 w/o any medical attention, w/o work up or proof of same; entry made here for future reference if needed.  UTERINE CANCER, HX OF (ICD-V10.42) - s/p laparoscopic hysterectomy & BSO 7/07 by [facility]inner at [facility]   DEGENERATIVE JOINT DISEASE (ICD-715.90) - on VICODIN tid prn,  ANAPROX 220m34m  Bid prn, ULTRAM 36m Prn... ? 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 she was on Wellbutrin & Vicodin when last seen in 2006... VITAMIN D DEFICIENCY (ICD-268.9) - Vit D level 5/10 = 16... rec> start OTC Vit D 2000 u daily. ~  9/14: on Vicodin, Tramadol, Aleve, VitD, etc; prev eval by DrDeveshwar for rheum; now c/o pain in left knee (she thinks it's "siatica"), can't exercise & wants to see [facility]- ok... ~  3/15: she states that topical [name] ("horse linament") rubbed into knees really helps but she wants to keep the Vicodinon hand just in case...  Hx of HEADACHE (ICD-784.0)  DYSTHYMIA (ICD-300.4) - off Celexa,  off Zoloft, and on ALPRAZOLAM 0.570mid as needed... she has [place] psychiatry [place] the [place]/ [place]... prev seeing psychologist Rebecca Rebecca [name] for eating disorder counselling... ~  4/13: she mentioned seeing Rebecca Rebecca [name] again for eating disorder & counseling for depression etc... 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  ~  3/15:  she has the Alpraz0.61mg72mr prn use but seldom uses it; still sees Rebecca Rebecca [name] on occas but doesn't think she needs to continue... ~  11/15:  She wants antidepressant med & we discussed Zoloft tria 100mg51m2 => 1 tab daily... She still sees Psychologist Rebecca Rebecca [name]. ~  3/16:  She is off the Zoloft & states the AlpraSharin Graveoo strong (advised cutting the med & try lower dose for the desired effect...  HEALTH MAINTENANCE:   ~  GI:  Per seen by DrPatterson but last colon was 1982 & she refuses follow up... ~  GYN:  [facility]:  She had Pneumovax in the past; she declines Flu shots and Tetanus shots...   Past Surgical History  Procedure Laterality Date  . 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 Appendectomy    . Cholecystectomy  1982  . [place]  [place]    [facility]  . Bilateral sal[place]omy  [place]    [facility]  . Laparoscopic gastric banding  08/2007    Dr. MartiHassell Doneutpatient Encounter Prescriptions as of 07/16/2014  Medication Sig  . albuterol (PROAIR HFA) 108 (90 BASE) MCG/ACT inhaler Inhale 2 puffs into the lungs every 6 (six) hours as needed.  . albMarland Kitchenterol (PROVENTIL HFA;VENTOLIN HFA) 108 (90 BASE) MCG/ACT inhaler Inhale 2 puffs into the lungs every 6 (six) hours as needed for wheezing or shortness of breath.  . ALPRAZolam (XANAX) 0.5 MG tablet TAKE 1/2 TO 1 TABLET BY MOUTH 3 TIMES DAILY AS NEEDED FOR NERVES. NOT TO EXCEED 3 PER DAY  . B Complex-C-Folic Acid (B-COMPLEX BALANCED) TABS Take 1 tablet by mouth daily.  . BeeRaelyn Ensignen 1000 MG TABS Take 2 tablets by mouth daily.  . cetirizine (ZYRTEC) 10 MG tablet Take 10 mg by mouth daily.    . Cholecalciferol (VITAMIN D-3) 5000 UNITS TABS Take 1 tablet by mouth daily.  . Coconut Oil 1000 MG CAPS Take 4 capsules by mouth daily.  . Coenzyme Q10 (COQ10) 100 MG CAPS Take 1 tablet by mouth daily.  . Colchicine 0.6 MG CAPS TAKE 1 TABLET TWICE DAILY FOR 5 DAYS  . cyclobenzaprine  (FLEXERIL)  10 MG tablet Take 1 tablet (10 mg total) by mouth 3 (three) times daily as needed for muscle spasms.  . Fluticasone-Salmeterol (ADVAIR) 500-50 MCG/DOSE AEPB Inhale 1 puff into the lungs 2 (two) times daily.  Javier Docker Oil 500 MG CAPS Take 1 capsule by mouth daily.  Marland Kitchen levalbuterol (XOPENEX) 1.25 MG/3ML nebulizer solution Take 1.25 mg by nebulization 3 (three) times daily.  . Magnesium 400 MG CAPS Take 2 capsules by mouth daily.  . naproxen sodium (ANAPROX) 220 MG tablet Take 2 tabs by mouth once daily   . tiotropium (SPIRIVA HANDIHALER) 18 MCG inhalation capsule PLACE 1 CAPSULE (18 MCG TOTAL) INTO INHALER AND INHALE DAILY.  . traMADol (ULTRAM) 50 MG tablet TAKE 1 TABLET BY MOUTH  3 TIMES DAILY   AS NEEDED FOR PAIN  . vitamin C (ASCORBIC ACID) 250 MG tablet Take 250 mg by mouth daily.    Allergies  Allergen Reactions  . Penicillins     REACTION: nausea and vomiting    Current Medications, Allergies, Past Medical History, Past Surgical History, Family History, and Social History were reviewed in [employer] record.   Review of Systems:         See HPI - all other systems neg except as noted... The patient complains of dyspnea on exertion and difficulty walking; chest discomfort, & leg weakness.  The patient denies anorexia, fever, weight loss, weight gain, vision loss, decreased hearing, hoarseness, syncope, prolonged cough, headaches, hemoptysis, abdominal pain, melena, hematochezia, severe indigestion/heartburn, hematuria, incontinence, suspicious skin lesions, transient blindness, depression, unusual weight change, abnormal bleeding, enlarged lymph nodes, and angioedema.    Objective:   Physical Exam:    WD, Morbidly Obese, 64 y/o WF in no acute distress but c/o dyspnea/ wheezing... GENERAL:  Alert & oriented; pleasant & cooperative... HEENT:  [place]/AT, EOM-wnl, PERRLA, EACs-clear, TMs-wnl, NOSE-clear, THROAT-clear & wnl. NECK:  Supple w/ fairROM; no JVD;  normal carotid impulses w/o bruits; no thyromegaly or nodules palpated; no lymphadenopathy. CHEST:  no accessory musc use, decr BS bilat, no wheezing/ rales/ rhonchi... HEART:  Regular Rhythm; without murmurs/ rubs/ or gallops heard... ABDOMEN:  Obese, soft & nontender; normal bowel sounds; no organomegaly or masses detected. EXT: without deformities, mod arthritic changes; no varicose veins/ +venous insuffic/trace edema. NEURO:  CNs intact; no focal neuro deficits... DERM: few ecchymoses, no rash etc...  RADIOLOGY DATA:  Reviewed in the EPIC EMR & discussed w/ the patient...  LABORATORY DATA:  Reviewed in the EPIC EMR & discussed w/ the patient...   Assessment & Plan:    ENT>> she is working w/ DrPlonk at [employer]...  OSA>  As prev noted she needs new sleep study & appropriate new CPAP apparatus to facilitate compliance but she declines the repeat study or sleep med referral...  COPD/ Emphysema>  Acute exac 2/16 improved after hosp in [place] w/ Levaquin, Pred taper, NEBS Bid, Avair500, Spiriva, Mucinex etc... Encouraged to stay on her O2 regularly & we will add Humidity, needs max nasal hygiene vs ENT follow up; continue NEBS/ Advair500/ Spiriva regularly & use the Mucinex; we will request the [facility] home Care home COPD program; needs to lose wt & incr exercise... 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 5/16>  She has seen ENT & Pulm 2nd opinion is pending; she cannot manipulate the port O2 tanks and we  will request an INOGEN One- port O2 concentrator for her use...  5/16>  States breathing worse, asked to use all regular meds regularly & we will add Depo120, Medrol taper...  Hx CP w/ neg cardiac evals including prev Myoviews; 2DEchos w/o incr PA pressures... 4/15> she had Cardiology consult for eval of AtypCP Rebecca Rebecca [name]); EKG was WNL & 2DEcho showed  norm LVF, Gr1DD & norm RV & PA pressures... 2/16> [facility] showed similar good LVF, valves and norm RV...  OBESITY>  She had remote lap band surg by DrMMartin; Met syndrome, must get wt down etc; she is a lap band failure & prev counseling by Rebecca Rebecca [name]... 3/16> weight down to 255# post hosp... 4/16> weight is back up to 271# despite all efforts...  ? Hx Hypothy>  TFTs remain normal off meds- no problem...  GI>  GERD> she denies symptoms & uses OTC meds prn...  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 C/O right ankle pain, eval by DrZSmith> no Rebecca [name] on his anti-inflamm rx so we will Rx w/ Depo120 & Medrol taper...  Dysthymia>  Much stress in her life, on Alpraz prn; has embraced alt lifestyle...   Patient's Medications  New Prescriptions   METHYLPREDNISOLONE (MEDROL) 8 MG TABLET    Use as directed  Previous Medications   ALBUTEROL (PROAIR HFA) 108 (90 BASE) MCG/ACT INHALER    Inhale 2 puffs into the lungs every 6 (six) hours as needed.   ALBUTEROL (PROVENTIL HFA;VENTOLIN HFA) 108 (90 BASE) MCG/ACT INHALER    Inhale 2 puffs into the lungs every 6 (six) hours as needed for wheezing or shortness of breath.   ALPRAZOLAM (XANAX) 0.5 MG TABLET    TAKE 1/2 TO 1 TABLET BY MOUTH 3 TIMES DAILY AS NEEDED FOR NERVES. NOT TO EXCEED 3 PER DAY   B COMPLEX-C-FOLIC ACID (B-COMPLEX BALANCED) TABS    Take 1 tablet by mouth daily.   BEE POLLEN 1000 MG TABS    Take 2 tablets by mouth daily.   CETIRIZINE (ZYRTEC) 10 MG TABLET    Take 10 mg by mouth daily.     CHOLECALCIFEROL (VITAMIN D-3) 5000 UNITS TABS    Take 1 tablet by mouth daily.   COCONUT OIL 1000 MG CAPS    Take 4 capsules by mouth daily.   COENZYME Q10 (COQ10) 100 MG CAPS    Take 1 tablet by mouth daily.   COLCHICINE 0.6 MG CAPS    TAKE 1 TABLET TWICE DAILY FOR 5 DAYS   CYCLOBENZAPRINE (FLEXERIL) 10 MG TABLET    Take 1 tablet (10 mg total) by mouth 3 (three) times daily as needed for muscle  spasms.   FLUTICASONE-SALMETEROL (ADVAIR) 500-50 MCG/DOSE AEPB    Inhale 1 puff into the lungs 2 (two) times daily.   KRILL OIL 500 MG CAPS    Take 1 capsule by mouth daily.   LEVALBUTEROL (XOPENEX) 1.25 MG/3ML NEBULIZER SOLUTION    Take 1.25 mg by nebulization 3 (three) times daily.   MAGNESIUM 400 MG CAPS    Take 2 capsules by mouth daily.   NAPROXEN SODIUM (ANAPROX) 220 MG TABLET    Take 2 tabs by mouth once daily    TIOTROPIUM (SPIRIVA HANDIHALER) 18 MCG INHALATION CAPSULE    PLACE 1 CAPSULE (18 MCG TOTAL) INTO INHALER AND INHALE DAILY.   TRAMADOL (ULTRAM) 50 MG TABLET    TAKE 1 TABLET BY MOUTH  3 TIMES DAILY   AS  NEEDED FOR PAIN   VITAMIN C (ASCORBIC ACID) 250 MG TABLET    Take 250 mg by mouth daily.  Modified Medications   No medications on file  Discontinued Medications   No medications on file

## 2014-07-16 NOTE — Telephone Encounter (Signed)
Below are the medrol instructions   We gave you a Depomedrol shot today, and a new prescription for Medrol 8mg  tabs>>  Take one tab twice daily for 5 days...  Then decrease to 1 tab each AM for 5 days...  Then decrease to 1/2 tab daily for 5 days...  Then decrease to 1/2 tab every other day til gone (1/2, 0. 1/2, 0, etc)...  Spoke with Wilfred Lacy, pharmacist with [facility] and notified of directions  Nothing further needed

## 2014-07-16 NOTE — Patient Instructions (Signed)
Today we updated your med list in our EPIC system...    Continue your current medications the same...  We gave you a Depomedrol shot today, and a new prescription for Medrol 8mg  tabs>>    Take one tab twice daily for 5 days...    Then decrease to 1 tab each AM for 5 days...    Then decrease to 1/2 tab daily for 5 days...    Then decrease to 1/2 tab every other day til gone (1/2, 0. 1/2, 0, etc)...  Today we checked some follow up blood work...    We will contact you w/ the results when available...   Let's see if the team at [facility] has anything else to offer for your severe COPD.Marland KitchenMarland Kitchen

## 2014-07-19 ENCOUNTER — Ambulatory Visit: Payer: Self-pay | Admitting: Family Medicine

## 2014-07-23 ENCOUNTER — Telehealth: Payer: Self-pay | Admitting: Pulmonary Disease

## 2014-07-23 MED ORDER — METHYLPREDNISOLONE 8 MG PO TABS: 8.0000 mg | ORAL_TABLET | Freq: Two times a day (BID) | ORAL | Status: DC

## 2014-07-23 NOTE — Telephone Encounter (Signed)
Spoke with pt, states that yesterday she began dropping her Medrol 8mg  down from bid to qd per [facility]- today is experiencing labored breathing.   Denies chest pain, fever.  Only coughs up mucus when using her nebulizer.  Neb has helped some but not completely with labored breathing. Pt uses [facility] on [address] in [place].  Is requesting further recs.  Sending to doc of day as Dr. Lenna Gilford is out today. Dr. Elsworth Soho please advise.  Thanks!

## 2014-07-23 NOTE — Telephone Encounter (Signed)
Ok to increase back to 8 bid medrol x 3 days , then taper down per previous recomm

## 2014-07-23 NOTE — Telephone Encounter (Signed)
Called and spoke to pt. Informed her of the recs per RA. Rx sent to preferred pharmacy. Pt verbalized understanding and denied any further questions or concerns at this time.   

## 2014-08-07 ENCOUNTER — Telehealth: Payer: Self-pay | Admitting: Pulmonary Disease

## 2014-08-07 NOTE — Telephone Encounter (Signed)
Called and spoke to pt. Pt stated when she was 8mg  of medrol she felt much better. Pt stated an O2 tank lasted her 3 days, she had more energy, little SOB, little cough. Pt stated now that she is down to the 4mg  of medrol she has noticed an increase in SOB and prod cough with brown and green mucus (pt contributing this to dust from cleaning her house). Pt denies f/c/s and CP/tightness. Pt requesting to be bumped up to 8mg  of medrol. Pt aware SN is not in office until 6/8 morning--pt wanting to wait for SN's recs.   SN please advise.   Allergies  Allergen Reactions  . Penicillins     REACTION: nausea and vomiting    Current Outpatient Prescriptions on File Prior to Visit  Medication Sig Dispense Refill  . albuterol (PROAIR HFA) 108 (90 BASE) MCG/ACT inhaler Inhale 2 puffs into the lungs every 6 (six) hours as needed. 1 Inhaler 6  . albuterol (PROVENTIL HFA;VENTOLIN HFA) 108 (90 BASE) MCG/ACT inhaler Inhale 2 puffs into the lungs every 6 (six) hours as needed for wheezing or shortness of breath. 1 Inhaler 6  . ALPRAZolam (XANAX) 0.5 MG tablet TAKE 1/2 TO 1 TABLET BY MOUTH 3 TIMES DAILY AS NEEDED FOR NERVES. NOT TO EXCEED 3 PER DAY 90 tablet 5  . B Complex-C-Folic Acid (B-COMPLEX BALANCED) TABS Take 1 tablet by mouth daily.    Raelyn Ensign Pollen 1000 MG TABS Take 2 tablets by mouth daily.    . cetirizine (ZYRTEC) 10 MG tablet Take 10 mg by mouth daily.      . Cholecalciferol (VITAMIN D-3) 5000 UNITS TABS Take 1 tablet by mouth daily.    . Coconut Oil 1000 MG CAPS Take 4 capsules by mouth daily.    . Coenzyme Q10 (COQ10) 100 MG CAPS Take 1 tablet by mouth daily.    . Colchicine 0.6 MG CAPS TAKE 1 TABLET TWICE DAILY FOR 5 DAYS 10 capsule 0  . cyclobenzaprine (FLEXERIL) 10 MG tablet Take 1 tablet (10 mg total) by mouth 3 (three) times daily as needed for muscle spasms. 90 tablet 5  . Fluticasone-Salmeterol (ADVAIR) 500-50 MCG/DOSE AEPB Inhale 1 puff into the lungs 2 (two) times daily. 2 each 0  . Krill  Oil 500 MG CAPS Take 1 capsule by mouth daily.    Marland Kitchen levalbuterol (XOPENEX) 1.25 MG/3ML nebulizer solution Take 1.25 mg by nebulization 3 (three) times daily. 270 mL 6  . Magnesium 400 MG CAPS Take 2 capsules by mouth daily.    . methylPREDNISolone (MEDROL) 8 MG tablet Use as directed 20 tablet 0  . methylPREDNISolone (MEDROL) 8 MG tablet Take 1 tablet (8 mg total) by mouth 2 (two) times daily. 8 tablet 0  . naproxen sodium (ANAPROX) 220 MG tablet Take 2 tabs by mouth once daily     . tiotropium (SPIRIVA HANDIHALER) 18 MCG inhalation capsule PLACE 1 CAPSULE (18 MCG TOTAL) INTO INHALER AND INHALE DAILY. 20 capsule 0  . traMADol (ULTRAM) 50 MG tablet TAKE 1 TABLET BY MOUTH  3 TIMES DAILY   AS NEEDED FOR PAIN 90 tablet 1  . vitamin C (ASCORBIC ACID) 250 MG tablet Take 250 mg by mouth daily.     No current facility-administered medications on file prior to visit.

## 2014-08-08 MED ORDER — METHYLPREDNISOLONE 8 MG PO TABS: ORAL_TABLET | ORAL | Status: DC

## 2014-08-08 NOTE — Telephone Encounter (Signed)
Pt returning call.Stanley A Dalton ° °

## 2014-08-08 NOTE — Addendum Note (Signed)
Addended by: Mathis Dad on: 08/08/2014 04:51 PM   Modules accepted: Orders

## 2014-08-08 NOTE — Telephone Encounter (Signed)
Per SN - he would prefer that patient stay on 4mg  daily, but if patient feels like she must go to 8mg , he says that this is fine if she takes 8mg  daily until she sees the doctor next week. Patient notified. Nothing further needed.

## 2014-08-14 DIAGNOSIS — Z6841 Body Mass Index (BMI) 40.0 and over, adult: Secondary | ICD-10-CM | POA: Diagnosis not present

## 2014-08-14 DIAGNOSIS — J449 Chronic obstructive pulmonary disease, unspecified: Secondary | ICD-10-CM | POA: Diagnosis not present

## 2014-08-21 DIAGNOSIS — R0602 Shortness of breath: Secondary | ICD-10-CM | POA: Diagnosis not present

## 2014-08-21 DIAGNOSIS — J988 Other specified respiratory disorders: Secondary | ICD-10-CM | POA: Diagnosis not present

## 2014-08-21 DIAGNOSIS — J449 Chronic obstructive pulmonary disease, unspecified: Secondary | ICD-10-CM | POA: Diagnosis not present

## 2014-08-21 DIAGNOSIS — J438 Other emphysema: Secondary | ICD-10-CM | POA: Diagnosis not present

## 2014-08-21 DIAGNOSIS — J9811 Atelectasis: Secondary | ICD-10-CM | POA: Diagnosis not present

## 2014-08-22 ENCOUNTER — Telehealth: Payer: Self-pay | Admitting: Pulmonary Disease

## 2014-08-22 MED ORDER — FUROSEMIDE 40 MG PO TABS: ORAL_TABLET | ORAL | Status: DC

## 2014-08-22 NOTE — Telephone Encounter (Signed)
Rx has been sent in. Pt is aware. Nothing further was needed. 

## 2014-08-27 ENCOUNTER — Other Ambulatory Visit: Payer: Self-pay

## 2014-09-04 ENCOUNTER — Encounter: Payer: Self-pay | Admitting: Pulmonary Disease

## 2014-09-04 ENCOUNTER — Ambulatory Visit (INDEPENDENT_AMBULATORY_CARE_PROVIDER_SITE_OTHER): Payer: Medicare Other | Admitting: Pulmonary Disease

## 2014-09-04 VITALS — BP 140/82 | HR 97 | Temp 97.5°F | Wt 271.8 lb

## 2014-09-04 DIAGNOSIS — G4733 Obstructive sleep apnea (adult) (pediatric): Secondary | ICD-10-CM | POA: Diagnosis not present

## 2014-09-04 DIAGNOSIS — J9611 Chronic respiratory failure with hypoxia: Secondary | ICD-10-CM | POA: Diagnosis not present

## 2014-09-04 DIAGNOSIS — E8881 Metabolic syndrome: Secondary | ICD-10-CM

## 2014-09-04 DIAGNOSIS — I519 Heart disease, unspecified: Secondary | ICD-10-CM

## 2014-09-04 DIAGNOSIS — M150 Primary generalized (osteo)arthritis: Secondary | ICD-10-CM

## 2014-09-04 DIAGNOSIS — F341 Dysthymic disorder: Secondary | ICD-10-CM

## 2014-09-04 DIAGNOSIS — J449 Chronic obstructive pulmonary disease, unspecified: Secondary | ICD-10-CM | POA: Diagnosis not present

## 2014-09-04 DIAGNOSIS — I5189 Other ill-defined heart diseases: Secondary | ICD-10-CM

## 2014-09-04 DIAGNOSIS — M159 Polyosteoarthritis, unspecified: Secondary | ICD-10-CM

## 2014-09-04 NOTE — Progress Notes (Signed)
HPI  Review of Systems  Physical Exam  Patient ID: Rebecca [name], female   DOB: [birth year], 64 y.o.   MRN: [id number] Subjective:   Patient ID: Rebecca [name], female    DOB: [birth year], 64 y.o.   MRN: [id number]  HPI 64 y/o WF known to me w/ mult med problems as noted below...  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  LABS 3/15:  Chems- BS=95 A1c=6.8 (needs low carb diet, exercise, wt loss) & Creat=1.5 (mild renal insuffic due to Lasix rx; Rec to decrease Lasix40 to 1 tab Qam...  2DEcho 4/15 showed norm LV sys function w/ EF=60-65%, normal wall motion, Gr1DD, normal valves, normal RV & PA pressures...  ADDENDUM>>  she had Cards eval [name] 4/15> he did not feel that invasive testing was warranted; she had 2DEcho but refused Myoview due to co-pay costs...   ~  November 07, 2013:  27moROV & [name] reports "I'm doing great, just a little arthritis in my mouse hand"; she also relates a lot of grief over the loss of her 9y/o Min-Pin dog Brixx who ran away several mo ago;  She reports that she participated in a medical study out of [place] regarding spacers for folks w/ COPD using MDIs, no ch in her meds required;  She remains on ADVAIR500Bid, SPIRIVA daily, & ProairHFA prn (uses 3-4 x daily "It really helps");  She has Home O2 but dislikes the Liq O2 therefore not using & she wants a portable O2 concentrator to read 4L/min w/ exercise, 2L/min at rest;  She still doesn't like the nasal cannula, doesn't feel that it gets into her system but like the face mask attachment because she can feel it [name]- of course we discussed all these issues (again)...     She has continued to research her supplements and herbal meds>  She tells me that she is living on smoothies she makes out of juice, ice, baking soda ("only a tiny bit"), pomegranate syrup, & local honey;  Tumeric helps the  arthritis in her knees and she has decreased the amt of aleve she was using;  She notes that pinching her upper lip in the middle under her nose helps relieve musc cramps in her legs;  She states that she hasn't had any palpit or skip beats since starting on a Magnesium supplement...    She still sees [name] for help w/ her eating disorder but was a bit upset that he didn't have a diet handout for her;  We gave her the papers we have here to go by... We reviewed prob list, meds, xrays and labs> see below for updates >> she declines the 2015 Flu vaccine today...  Ambulatory O2 Test 9/15>  O2 sat on RA at rest= 92% w/ pulse=86;  Nadir O2sat on RA after 2 laps= 84% w/ pulse=96...   ~  November 17, 2013:  GArtis Delaypresents for an urgent add-on appt (refused to go to the ER) w/ acute SOB; she called yest c/o chest tightness, cough, sm amt of whitish sput & wanted something to "dry me up" (given antihist)- she refused antibiotics and Prednisone;   drove herself to the office where she was found to be tachypneic w/ RR=24, hypoxemic on RA w/ O2sat=83%; she denies f/c/s, discolored sput, CP; chest exam w/ decr BS, exp wheezing & using accessory muscles;  dry cough, no rales or consolidation...    RX> placed on O2 via mask w/ improvement in sats to 93%; given NEB treatment w/ Xopenex 0.63 x2; given Depo120 as well => improved w/ [name] air movement, decr wheezing, and O2sat up to 97%    REC> to change Advair500 to NEBS w/ ALBUT2.72m Qid (?Xopenex not covered)/ BUDES0.518mBid; Pred 2015m4d tapering sched, Mucinex 1200m61md & fluids... Continue Spiriva daily and home Oxygen... We reviewed prob list, meds, xrays and labs> see below for updates >> reminded of low carb, no sweets, get wt down...  CXR today showed COPD/emphysema, no acute abnormality...  LABS 9/15:  Chems- ok w/ BS=119, A1c=6.7, Cr=1.4;  CBC- ok...  ~  December 15, 2013:  634mo 834mo& [name] iArtis Delayuch improved on the Pred taper=> now down to 1/2 Qod but she  wants off, feels worse on the day she takes it she says; advised slow taper to 1/2 Q3d til gone, and reminded to use the Xopenex/Budes NEB Bid regularly, and the Mucinex 1200mg 26mw/ Fluids...  She tells me that she has had some recent dental problems and inquired about a state sponsored dental clinic option but there were no openings unless her doctor wrote a letter indicating that her dental prob was contrib to her medical illness etc=> letter written & given to pt per request...  She has been using the Xanax but feels it is too strong for her- even 1/2 tab at bedtime gives her a hangover & she is advised to further decr the dose until she finds the best combination of rest & no morning side effect... Plan ROV 34mo.  134moecember 17, 2015:  34mo ROV70moil is cArtis Delayleft arm pain- shoulder down to the wrist "it's a nerve" she says; discomfort present for 2 wks, can't sleep, "hurt so much I cried", tried OTC meds, horse linament, Tramadol, Hydrocodone & none w/ relief; using heating pad which helps some, she denies neck pain or decr ROM=> we discussed needed referral to [place] [facility]...     She has weaned off the Pred as noted above but she also stopped the Xopenex/ Budesonide via NEB because it was too expensive; she is back on her Advair500-2spBid & Spiriva daily; she has Albuterol2.5 for the Nebulizer to use prn; she continues to take Mucinex1200Bid + Fluids; she notes that her breathing is stable- DOE w/ ADLs and no real improvement, she has not been able to exercise, or diet, and weight is up to 274# w/ BMI~44;  She still desaturates w/ activity on RA but she refuses to wear the oxygen regularly as prescribed- states she uses it intermittently if SOB & take it off 5-120 min later when she recovers;  She has not yet had her dental work;  Zoloft seems to be helping her depression but she does not want to increase from 50mg to 42mg/d;  12mis still upset at [name] [facility] about her dog  Brix who ran away earlier this yr...     We reviewed prob list, meds, xrays and labs> see below for updates >> she refuses the 2015 flu vaccine....   ~  May 02, 2014:  2-24mo ROV & 29mo hosp check>  [facility] [place]/ COPD exac ([place] Levaquin[place], Pred taper, NEBS w/ Xopenex, and continued on her Advair500Bid, Spiriva daily, Mucinex, Lasix40 prn >>  2/16 [facility] records indicated a thorough evaluation & excellent care provided but treatment plan was hampered by noncompliance- refused CPAP, declines chestPT, alternately stopping her O2 & demanding more O2,   LABS 2/16:  ABG- pH=7.46, pCO2=35, pO2=134 on 6L/min; Chems- ok x BS=169, A1c=6.5; CBC- normal; TSH=0.39 & FreeT4=1.54; Troponin/ D-dimer/ BNP- all wnl...  CXR 2/16 showed heart at upper lim of norm, hyperinflation, min bibasilar atx, NAD.Marland KitchenMarland Kitchen   EKG 2/16 showed NSR, rate88, low voltage, otherw wnl...  2DEcho 2/16 showed norm LVF w/ EF=55-60%, Gr1DD, norm valves and norm RV...  We reviewed the following medical problems during today's office visit >>     Hx mild OSA w/ mod desat, loud snoring, & leg jerks (on sleep study 2005)> she tried CPAP10 but stopped on her own & declined to repeat split night study & re-try CPAP rx; she says she's sleeping satis & wakes feeling refreshed...    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 1993; supposed to be on HomeO2- 2L rest & 4L exercise, NEBS w/ AlbutQid, Advair500Bid, Spiriva daily, Mucinex1-2Bid but not taking anything regularly; states that Flutter & Mucinex cause "spasms" in her back; states O2 doesn't help since she can't breathe thru her nose (she has been eval by ENT, [name]); finally tried [facility] but stopped after 42moas "fruitless"; prev stated she "can't breathe at all, can't do anything" etc; SOB w/ ADLs etc; Note> [facility] and last PFT (2011) showed FEV1= 1.5 (55%) & %1sec=59 ==> 3/15  she announced that she's been cured by taking a Magnesium supplement, along w/ herbal formula "Clear lungs" and "Lung tonic"; she had stopped her Oxygen;  Now using O2 up to 15L/min on her own since she can't breathe thru her nose- advised 2L/min rest & 4L/min exercise + humidity, nasal hygiene, etc;  Keep Pred 258md for now & ROV 3wks...    Hx AtypCP> baseline EKG w/ minor NSSTTWA; 2DEcho w/ norm LVF, EF=55-60%, Gr1DD, norm valves, norm LA/RV; Myoview 2009 normal w/o ischemia & EF=55%; she notes some atypCP & saw DrCherly Hensen/15, she declined Myoview.    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; she knows not to eat sodium, elev legs, wear support hose; Labs 2/16 showed Cr=1.2-3    Impaired Gluc Tolerance, Metabolic syndrome> she refused meds; prev eval by DrEllison w/ rec for Metformin Rx but she rejects the DM diagnosis & refused meds; states "I'm eating healthier" (advised low carb low fat); Labs 2/16 showed BS~160, A1c=6.5...Marland KitchenMarland Kitchen  ?Hypothyroid> not currently on meds but she really wants thyroid Rx; prev TFTs all wnl; she prev had Endocrine in [place] prescribe Synthroid for her but she stopped going; clinically & biochemically euthyroid; Labs 2/16 showed TSH=0.39 & FreeT4=1.54    Morbid Obesity> peak weight ~340# before lap band surg 2009 by DrMMartin; lost to ~250# but then back up to 280-90#; refused f/u w/ [facility] due to cost of visits & lap-band adjustments; 3/16 weight isdown to 255# & we reviewed diet & exercise...    GI- GERD, IBS> she uses OTC PPI as needed; last colonoscopy was 1982 by DrPatterson & wnl, she refuses GI referral & refuses f/u colon etc...    GYN- s/p uterine cancer> [facility]    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 PLAN>>  Asked to continue Pred20/d for now, Advair500Bid, Spiriva daily, NEBS w/ Xopenex Bid-Tid, Mucinex600-2Bid, fluids; we will ask DME for humidity on her O2 & switch to pulse dose where able; she is c/o nasal dryness & we reviewed nasal regimen/hygiene; we offered second/third opinions (eg-DrKozlow), offered Hospice referral, but we ultimately decided on referral to [facility]... ROV in 3 wks... Note> [facility] & [facility]; Hospice required home-bound status and [place]; [facility] to set up humidity & pulse-dose oxygen...   ~  May 21, 2014:  3wk ROV & recheck> GArtis Delayspent most of this 270m ROV ventilating about her frustration over [facility], her oxygen, the Prednisone, her sister, and life in general; In the interval she developed "ROID RAGE" because of her Pred Rx at 2039m- her breathing was [name] but she flew into a rage & discussed this w/ her psychologist, [name]- then called us [place] instructed to wean down the Pred; now on 3m29mand she is much [name] but she states "I want off" and we outlined a slow weaning schedule for her over the next month;  She also reports an interval trip to the ER w/ constipation...     OSA> she has declined a repeat sleep study, using O2 at night- 2L/min by [place]...    COPD/ Emphysema> on Pred taper (now 3mg72m Advair500Bid, Spiriva daily, Xopenex NEBS Tid, Ventolin HFA prn; based on PFTs in 2011 she had GOLD Stage 2 COPD, patient group B, but has clearly deteriorated over the last few yrs; [place] and she appears improved at present but declines to do f/u PFT at this time; she is very frustrated w/ [facility]- we have tried to get humidifiers set up & pulse-dose  apparatus on her portable equipment (OK to adjust pulse-dose O2 to 3L/min rest & 5L/min exercise)...  We reviewed prob list, meds, xrays and labs> see below for updates >>  PLAN>> We will again try to get [facility] her humidity & pulse-dose oxygen (OK'd for 3-5L/min flow);  Wean Pred according to slow taper printed on the AVS; ROV in 6wks w/ PFT...  ~  June 05, 2014:  2wk ROV & add-on for SOB> [name] iArtis Delaylways SOB due to her severe GOLD stage 3-4 COPD and her CC is "my life is miserable, I hate my life" she is angry at the world- her family (esp sister), [employer], Medicare, all of her doctors, etc... Today she focuses on not being able to breathe thru her nose & reminds me that [name] wanted to operate but I assessed her operative risk too high; says she can't use her oxygen due to this issue & [facility] has been unable to satisfy her needs in this area (she states that shje has to wait another 21mo 43moe Medicare will permit changing DME providers)... She is quite tearful but still able to talk w/ loud volume and full sentences;  She has mild cough, sm amt beige sput, no hemoptysis, chr stable/ severe SOB/DOE w/ ADLs and any activity, difficulty caring for her pets...     OSA> she has declined a repeat sleep study, using O2 at night- 2L/min by [place],  occas puts it in her mouth...    COPD/ Emphysema> on Pred taper (now 20m/d), Advair500Bid, Spiriva daily, Xopenex NEBS Tid, Ventolin HFA prn; based on PFTs in 2011 she had GOLD Stage 2 COPD, patient group B, but has clearly deteriorated over the last few yrs; [place] and she appears to be stable at present; she agreed to repeat Spirometry today> c/w GOLD Stage 3-4 COPD (see below)... EXAM shows Afeb, VSS, O2sat=90% on RA in office;  HEENT- nasal congestion, erythema, turbinate hypertrophy, dev septum, throat= Mallampati 2, sl red, no exudates or lesions;  Chest- decr BS bilat w/o w/r/r & no signs of consolidation;  Ext- VI w/  trace edema;  Psyche- very distraught...  Last CXR 2/16 showed heart at upper lim of norm, hyperinflation, min bibasilar atx, NAD  Spirometry 06/2014 showed FVC=1.61 (50%), FEV1=0.88 (35%), %1sec=55, mi-flows are reduced to 18% predicted... 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 PLAN>>  We discussed referral to medical center to consider ENT eval to help her nasal obstruction, & eval by pulmonary team to see if they can think of anything else to help this nice lady...  ~  Jul 02, 2014:  147moOV & [name] reports good days and bad- today is a good day & she thinks maybe because she didn't take Xanax last PM (she will investigate);  [facility] are [facility] to have to do surg, now on nasal srays + Saline & an ointment, they removed ear wax & plan hearing eval soon;  She is using a new head-set w/ microphone-like oxygen delivery device instead of nasal cannula;  She notes some back spasms and using her Flexeril;  She brought a copy of COPD Digest & an article that states if a Medicare pt requires a different O2 delivery system, then the DME must comply- she is unable to use the mult portable tanks due to weight, awkward maneuvering, can't manipulate the controls, etc=> we will request an [employer] ONE portable concentrator for her...    She remains on Home O2 at 2L/min, Advair500Bid, Spiriva daily, Xopenex NEBS Tid, Ventolin HFA prn;  Chest is clear w/ decr BS bilat, no w/r/r;  Heart shows RR, no m/r/g... We reviewed prob list, meds, xrays and labs> see below for updates >>  PLAN>>  We will request the port oxygen concentrator for her use=> [facility] She will f/u w/ ENT & [facility]- ROV in 60m54mo  ~  Jul 16, 2014:  2wk ROV & add-on appt requested for mult issues, here w/ her friend PegVickii Chafeday... States she's been more SOB for the past week w/ thick mucus, left ankle pain & she goes into great detail about prev ankle sprain yrs  ago, but [name] Dx prob gout & rec Ibuprofen/ Indocin/ Colchicine but it's still sore w/ decr ROM- I told her we would need to send her to Ortho if Depo120, MEDROL taper (she won't take Pred due to "rage") didn't resolve the issue... She states that 2wks ago her breathing was the best it's been in years! But now c/o cough, congestion, incr SOB "it's so bad", says she's desat at home & increased her O2 to 8L/min but ran out of tanks fast! She's taking OTC Magnesium supplements; O2sat here is 93% on RA!!!  [facility] for [facility] 2nd opinion in  June... She tells me about a posting on Facebook about "6 deadly antibiotics" & Levaquin was one of them...     We reviewed prob list, meds, xrays and labs> see below for updates >> she wants a letter so the post office will bring mail to her door...  LABS 5/16:  Chems- wnl w/ BS=90, Cr=1.02;  Mg=2.2, Ca=9.0;  Uric=6.6.Marland KitchenMarland Kitchen Plan>>  We discussed Rx w/ Depo120, Medrol91m- tapering sched, and to continue regular dosing w/ Home O2 at 2-3L/min, Advair500Bid, Spiriva daily, Xopenex NEBS Tid, Ventolin HFA prn, & Guaifenesin...   ~  September 04, 2014:  6wk ROV & GArtis Delayhas had [facility]>>      ENT eval by DrPlonk & his notes are reviewed> HxCOPD w/ revers component and chr nasal obstruction L>R, nasal crusting, bilat inferior nasal turbinate hypertrophy, etc;  They rec nasal saline, nasal steroid spray, mupirocin ointment, and breathe right nasal strips; they will consider surg if not improved...       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 CTChest 08/21/14 showed severe centrilobular emphysema, diffuse bronchial thickening, mild mucus plugging, no adenopathy, no lung nodules etc; also showed mild Ao & coronary calcif, lap band at GE junction, splenosis, DJD Tspine... PLAN> They decided to slowly wean the Medrol, continue Advair & wean to Advair250Bid, continue Spiriva, continue NEBS & AlbutHFA; rec to continue her oxygen (but she continues to use it "prn"), consider repeat sleep study & CPAP titration, [facility], referred for nutritional counseling & ROV 247mo.      She reports breathing well recently, she has a new roommate, eating out a lot, went to "theclub", then one day over the weekend=> incr SOB, she's been taking Lasix80 Qam, rambling hx w/ flight of ideas regarding coffee, caffeine, not urinating enough, she had to use [facility] [place] to [place];  They decreased her Advair250-Bid, Medrol6m61m, continue Nebs (she's using Bid because she "cramps up" if she does it tid); she is awaiting nutritional counseling, pulm rehab referral, oxygen titration testing, sleep study...  We reviewed prob list, meds, xrays and labs> see below for updates >>  PLAN>>  She will maintain close contact & f[facility]u w/ [facility];  Reminded to avoid sodium & carbs- handout given; plan ROV here in 2-36mo71mo          Problem List:    OBSTRUCTIVE SLEEP APNEA (ICD-327.23) - sleep study 2005 showed RDI=15 w/ desat to 78%, loud snoring & +leg jerks> on CPAP 10, prev used intermittently but now not at all... ~  11/12:  She has agreed to a new Sleep Study- split night protocol, to recheck her OSA & determine CPAP needs (check ABG if poss too)==> she never followed thru w/ the study. ~  Pt states that she is  resting satis and wakes refreshed, energy [name]... ~  12/15:  she reports not resting due to left shoulder/ arm pain & we will refer to Ortho (she saw Dr. Gardenia Phlegm)... ~  She continues to refuse repeat sleep study, CPAP, and declines f/u w/ sleep med...  [facility], Lozano- for chronic nasal congestion & obstruction> he is working to get her nares open to facilitate her breathing & nasal O2 Rx...  ~  Spring 2016> ENT eval by DrPlonk & his notes are reviewed> HxCOPD w/ revers component and chr nasal obstruction L>R, nasal crusting, bilat inferior nasal turbinate hypertrophy, etc;  They rec nasal saline, nasal steroid spray, mupirocin ointment, and breathe right nasal strips; they will consider surg if not improved...   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 supposed to be on NEBULIZER Qid, ADVAIR 500Bid + SPIRIVA daily (compliance is a serious issue- she freq stops all meds) ==> she had a second opinion [facility] at [facility] by DrAdair in 2006... ~  A1AT level is normal 218 (83-200) 3/09... ~  baseline CXR w/ borderline Cor, COPD, interstitial scarring/ atelectasis/ obesity... ~  PFTs 3/07 showed FVC=2.74 (82%), FEV1=1.67 (62%), %1sec=61, mid-flows=27%pred... improved from 2005. ~  CTChest in 2007 & 4/09 showed severe centrilob emyphysema, + interstitial fibrosis, sm HH, left renal cyst... neg for PE... ~  2010:  breathing improved w/ weight reduction after Lap-band surg... ~  2/11: COPD exac w/ neg CXR (chr changes, NAD), Rx w/ Depo/ Pred/ Avelox/ Mucinex/ NEBS/ Advair/ Spiriva/ etc... ~  PFTs 3/11 showed FVC= 2.57 (73%), FEV1= 1.51 (55%), %1sec=59, mid-flows= 21%pred. ~  Noncompliant w/ Rx & O2 thru 2012... On disability since 2011 & finally got Medicare coverage 9/12... ~  11/12: presents w/ worsening breathing & dyspnea w/ ADLs ==> we outlined further eval w/ 2DEcho, Sleep Study; Rx w/ NEBS QID, Advair500Bid, Spiriva daily, Mucinex, Oxygen, Lasix, Zoloft,  Xanax, etc (she declined to proceed w/ the sleep study, 2DEcho, etc)... 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  ~  4/13: she is c/o the nasal O2 not helping since it's hard to breathe thru nose & wants ENT referral==> saw [name] who wanted to do surg but she has severe COPD & hi risk; asked to start PulmRehab & she agrees... ~  10/13: she did PulmRehab for 71mo6-7/13 but quit since it was "fruitless"- barely got to do any exercise since it was so difficult getting to the facility,etc; still c/o nasal problems limiting her benefit from O2 & we discussed the poss of ?transtracheal oxygen? ~  9/14: exsmoker quit 1993; supposed to be on HomeO2- 2L rest & 4L exercise, NEBS w/ Albut, Advair500Bid, Spiriva daily, Mucinex1-2Bid but not taking anything regularly; states that Flutter & Mucinex cause "spasms" in her back; states O2 doesn't help since she can't breathe thru her nose (she has been eval by ENT, CErnesto Rutherford; finally tried [facility] after 247mos "fruitless"; states she "can't breathe at all, can't do anything" etc; SOB w/ ADLs=> O2 recert today (see below)... Note> [facility] and last PFT (2011) showed FEV1= 1.5 (55%) & %1sec=59... OXYGEN RE-CERT done today... ~  3/15: see above- pt states that a magnesium supplement along w/ herbal supplements "clear lungs" and "lung tonic" have made all the difference & she no longer needs oxygen, exercising regularly & much improved; still taking Advair500, Spiriva, AlbutHFA prn...  ~  CXR 3/15 showed underlying COPD, atelectasis in  RML, DJD in TSpine, NAD; Rec to take OTC Mucinex600-2Bid w/ fluids. ~  CXR 9/15 showed COPD/emphysema, no acute abnormality... ~  11/07/13: Ambulatory O2 Test 9/15> O2 sat on RA at rest= 92% w/ pulse=86;  Nadir O2sat on RA after 2 laps= 84% w/ pulse=96...  ~  11/17/13: she presented w/ acute SOB, wheezing, COPD exac>  treated w/ NEBS, Depo120, Pred taper, Mucinex1200Bid, fluids, & continue the Spiriva & Oxygen; ch her Advair to NEBS w/ Xopenex & Budesonide regularly... ~  10/15: she is much improved w/ Pred taper but she wants off; reminded to use NEBS w/ Xopenex & Budes Bid every day... ~  12/15: she is off the Pred, but also stopped the Xopenex/ Budesonide due to cost; she has gone back on her [place], plus Albut via NEBS, Mucinex/ Fluids/ etc... ~  [facility] 2/16 by Gaylyn Cheers w/ COPD exac> disch on Oxygen, Pred taper, Levaquin750, NEBS w/ Xopenex, Advair500, Spiriva, Mucinex, etc... ~  3/16: post hosp check, very frustrated that she can't get things the way SHE wants them; c/o O2 since she can't breathe thru nose & we reviewed nasal hygiene, huidified O2, ENT re-eval; on Pred taper, off Levaquin now, Advair500Bid, Spiriva daily, NEBS w/ Xopenex Bid, Mucinex (not taking regularly & asked to do 2Bid + fluids); we will try [facility] COPD program & recheck pt in 3 weeks... ~  4/16: she continues to be very frustrated w/ her disease & her care- can't breathe thru her nose & she feels the O2 is not doing her any good; we discussed refer to [place] for their review to see if there is anything they can do.  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  ATYPICAL CHEST PAIN (ICD-786.50) - eval by DrWall in 2009. ~  baseline EKG w/ NSR, PVC's, NSSTTWA, NAD... ~  2DEcho 2/05 showed mild MR/ TR, norm LA/ RV, EF=50-55%... ~  NuclearStressTest 4/09 was norm- no ischemia, EF=55%... similiar to prev studies at [place] & 2007... ~  3/15: she notes some atypCP & requests a  cardiac referral for eval- prev seen by DrWall, we will call for Cards consult per her request... ~  EKG 3/15 showed NSR, rate91, occas PVCs, otherw wnl... ~  4/15: she had Cards eval DrNishan> he did not feel that invasive testing was warranted; she had 2DEcho but refused Myoview due to co-pay costs...  ~  2DEcho 4/15 showed norm LV sys function w/ EF=60-65%, normal wall motion, Gr1DD, normal valves, normal RV & PA pressures... ~  EKG 2/16 showed NSR, rate88, low voltage, otherw wnl... ~  2DEcho 2/16 showed norm LVF w/ EF=55-60%, Gr1DD, norm valves and norm RV.  VENOUS INSUFFICIENCY, CHRONIC (ICD-459.81) - Hx chr ven insuffic w/ edema... on LASIX 28m- 1-2 daily (she self medicates)... ~  Labs 3/15 showed Cr=1.5 and she is asked to decr the Lasix to 450mQam...  IMPAIRED GLUCOSE TOLERANCE >>  METABOLIC SYNDROME X (ICTMH-962.2- she is on diet alone (refused Metformin therapy)... ~  labs 9/08 showed BS=120... FBS 5/08 was 93, HgA1c=7.1 ~  labs 5/10 showed BS= 182, A1c= 6.4 ~  labs 2/11 showed BS= 76,  A1c= 6.5 ~  Labs 5/12 showed BS= 103, A1c= 6.5 ~  She saw DrEllison 4/13 w/ A1c=7.1 but she rejects the diagnosis of Diabetes & refused Metform548m/d... ~  10/13:  We discussed IMPAIRED GLUCOSE TOLERANCE & need for low carb wt reducing diet and incr exercise... 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 ~  max weight ~340# before lap band surg 7/09 by DrMartin. ~  weight 12/09 = 291# ~  weight 5/10 = 265# ~  weight 11/10- = 254# ~  weight  2/11 = 260# ~  Weight 5/12= 290# ~  Weight 11/12 = 286# ~  Weight 4/13 = 280# => BMI=45 ~  Weight 10/13 = 289# ~  Weight 9/14 = 281# ~  Weight 3/15 = 270# ~  Weight 9/15 = 267# ~  Weight 12/15 = 274# ~  Weight 3/16 = 255# => post hosp... ~  Weight 4/16 = 271# ~  Weight 7/16 = 272#  ? Hx HYPOTHYROIDISM (ICD-244.9) - off thyroid meds since 1/10... she was started on Synthroid and followed by DrRSmith in [place] & last seen 7/09- note reviewed... she refuses f/u by [name]- we will recheck TSH off Synthroid Rx... ~  labs 5/10 off Synthroid since 1/10 showed TSH= 1.13 ~  labs 2/11 showed TSH= 1.82 ~  Labs 5/12 showed TSH= 1.81 ~  2013: She wanted to restart Thyroid hormone but TSH remains normal> referred to Endocrine for further eval but she was upset w/ DrEllison's eval; she will decide if she wants to pursue another opinion... ~  9/14: not currently on meds but she really wants thyroid Rx; prev TFTs all wnl; she prev had Endocrine in [place] prescribe Synthroid for her but she stopped going; clinically & biochemically euthyroid...  ~  3/15: on Synthroid100-12 tab daily; Labs showed TSH= 3.00 ~  She stopped the Synthroid again... ~  Labs 2/16 not on meds showed TSH=0.39 & FreeT4=1.54   GERD (ICD-530.81) - prev on Nexium but off all meds since 1/10... IRRITABLE BOWEL SYNDROME (ICD-564.1) - colonoscopy 1982 by DrPatterson was WNL... She has refused f/u GI eval & f/u colonoscopy...  ?KIDNEY STONE Hx >> pt said she passed a kidney stone 3/11 w/o any medical attention, w/o work up or proof of same; entry made here for future reference if needed.  UTERINE CANCER, HX OF (ICD-V10.42) - s/[place]..   DEGENERATIVE JOINT DISEASE (ICD-715.90) - on VICODIN tid prn,  ANAPROX 2262mBid prn, ULTRAM 5043mrn... ? 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 she was on Wellbutrin  & Vicodin when last seen in 2006... VITAMIN D DEFICIENCY (ICD-268.9) - Vit D level 5/10 = 16... rec> start OTC Vit D 2000 u daily. ~  9/14: on Vicodin, Tramadol, Aleve, VitD, etc; prev eval by DrDeveshwar for rheum; now c/o pain in left knee (she thinks it's "siatica"), can't exercise & wants  to [facility]- ok... ~  3/15: she states that topical [name] ("horse linament") rubbed into knees really helps but she wants to keep the Vicodinon hand just in case...  Hx of HEADACHE (ICD-784.0)  DYSTHYMIA (ICD-300.4) - off Celexa,  off Zoloft, and on ALPRAZOLAM 0.110mTid as needed... she has seen psychiatry in the [place] and was Dx w/ seasonal affective disorder... prev seeing psychologist [name] for eating disorder counselling... ~  4/13: she mentioned seeing [name] again for eating disorder & counseling for depression etc... ~  9/14: on Alprazolam 0.532mtid prn; prev eval by Psychiatry w/ seasonal affective disorder; still sees Psychology [name] re: eating disorder; under considerable stress due to relationship w/ sister, financial pressure, etc...  ~  3/15: she has the Alpraz0.78m57mor prn use but seldom uses it; still sees [name] on occas but doesn't think she needs to continue... ~  11/15:  She wants antidepressant med & we discussed Zoloft tria 100m53m/2 => 1 tab daily... She still sees Psychologist [name]. ~  3/16:  She is off the Zoloft & states the AlprSharin Gravetoo strong (advised cutting the med & try lower dose for the desired effect...  HEALTH MAINTENANCE:   ~  GI:  Per seen by DrPatterson but last colon was 1982 & she refuses follow up... ~  GYN:  [facility]  She had Pneumovax in the past; she declines Flu shots and Tetanus shots...   Past Surgical History  Procedure Laterality Date  . 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 Appendectomy    . Cholecystectomy  1982  . [place]  [place]  . [place]  [place]    [place]  . Laparoscopic gastric banding  08/2007    Dr. MartHassell DoneOutpatient Encounter Prescriptions as of 09/04/2014  Medication Sig  . albuterol (PROAIR HFA) 108 (90 BASE) MCG/ACT inhaler Inhale 2 puffs into the lungs every 6 (six) hours as needed.  . alMarland Kitchenuterol (PROVENTIL HFA;VENTOLIN HFA) 108 (90 BASE) MCG/ACT inhaler Inhale 2 puffs into the lungs every 6 (six) hours as needed for wheezing or shortness of breath.  . ALPRAZolam (XANAX) 0.5 MG tablet TAKE 1/2 TO 1 TABLET BY MOUTH 3 TIMES DAILY AS NEEDED FOR NERVES. NOT TO EXCEED 3 PER DAY  . B Complex-C-Folic Acid (B-COMPLEX BALANCED) TABS Take 1 tablet by mouth daily.  . BeRaelyn Ensignlen 1000 MG TABS Take 2 tablets by mouth daily.  . cetirizine (ZYRTEC) 10 MG tablet Take 10 mg by mouth daily.    . Cholecalciferol (VITAMIN D-3) 5000 UNITS TABS Take 1 tablet by mouth daily.  . Coconut Oil 1000 MG CAPS Take 4 capsules by mouth daily.  . Coenzyme Q10 (COQ10) 100 MG CAPS Take 1 tablet by mouth daily.  . Colchicine 0.6 MG CAPS TAKE 1 TABLET TWICE DAILY FOR 5 DAYS  . cyclobenzaprine (FLEXERIL) 10 MG tablet Take 1 tablet (10 mg total) by mouth 3 (three) times daily as needed for muscle spasms.  . Fluticasone-Salmeterol (ADVAIR) 500-50 MCG/DOSE AEPB Inhale 1 puff into the lungs 2 (two) times daily.  . furosemide (LASIX) 40 MG tablet Take 1-2 tablets once a day as needed for swelling  . Krill Oil 500 MG CAPS Take 1 capsule by mouth daily.  . leMarland Kitchenalbuterol (XOPENEX) 1.25 MG/3ML nebulizer solution Take 1.25 mg by nebulization 3 (three) times daily.  .Marland Kitchen  Magnesium 400 MG CAPS Take 2 capsules by mouth daily.  . methylPREDNISolone (MEDROL) 2 MG tablet Take 6 mg by mouth.  . naproxen sodium (ANAPROX) 220 MG tablet Take 2 tabs by mouth once daily   . tiotropium (SPIRIVA HANDIHALER) 18 MCG inhalation capsule PLACE 1 CAPSULE (18 MCG TOTAL) INTO INHALER AND INHALE DAILY.  . traMADol (ULTRAM) 50 MG tablet TAKE 1  TABLET BY MOUTH  3 TIMES DAILY   AS NEEDED FOR PAIN  . vitamin C (ASCORBIC ACID) 250 MG tablet Take 250 mg by mouth daily.  . [DISCONTINUED] methylPREDNISolone (MEDROL) 8 MG tablet Take 1 tablet (8 mg total) by mouth 2 (two) times daily. (Patient not taking: Reported on 09/04/2014)  . [DISCONTINUED] methylPREDNISolone (MEDROL) 8 MG tablet Use as directed (Patient not taking: Reported on 09/04/2014)   No facility-administered encounter medications on file as of 09/04/2014.     Allergies  Allergen Reactions  . Penicillins     REACTION: nausea and vomiting    Current Medications, Allergies, Past Medical History, Past Surgical History, Family History, and Social History were reviewed in [employer] record.   Review of Systems:         See HPI - all other systems neg except as noted... The patient complains of dyspnea on exertion and difficulty walking; chest discomfort, & leg weakness.  The patient denies anorexia, fever, weight loss, weight gain, vision loss, decreased hearing, hoarseness, syncope, prolonged cough, headaches, hemoptysis, abdominal pain, melena, hematochezia, severe indigestion/heartburn, hematuria, incontinence, suspicious skin lesions, transient blindness, depression, unusual weight change, abnormal bleeding, enlarged lymph nodes, and angioedema.    Objective:   Physical Exam:    WD, Morbidly Obese, 64 y/o WF in no acute distress but c/o dyspnea/ wheezing... GENERAL:  Alert & oriented; pleasant & cooperative... HEENT:  [place]/AT, EOM-wnl, PERRLA, EACs-clear, TMs-wnl, NOSE-clear, THROAT-clear & wnl. NECK:  Supple w/ fairROM; no JVD; normal carotid impulses w/o bruits; no thyromegaly or nodules palpated; no lymphadenopathy. CHEST:  no accessory musc use, decr BS bilat, no wheezing/ rales/ rhonchi... HEART:  Regular Rhythm; without murmurs/ rubs/ or gallops heard... ABDOMEN:  Obese, soft & nontender; normal bowel sounds; no organomegaly or masses  detected. EXT: without deformities, mod arthritic changes; no varicose veins/ +venous insuffic/trace edema. NEURO:  CNs intact; no focal neuro deficits... DERM: few ecchymoses, no rash etc...  RADIOLOGY DATA:  Reviewed in the EPIC EMR & discussed w/ the patient...  LABORATORY DATA:  Reviewed in the EPIC EMR & discussed w/ the patient...   Assessment & Plan:    ENT>> she is working w/ DrPlonk at [employer]...  OSA>  As prev noted she needs new sleep study & appropriate new CPAP apparatus to facilitate compliance but she declines the repeat study or sleep med referral...  COPD/ Emphysema>  Acute exac 2/16 improved after hosp in [place] w/ Levaquin, Pred taper, NEBS Bid, Avair500, Spiriva, Mucinex etc... Encouraged to stay on her O2 regularly & we will add Humidity, needs max nasal hygiene vs ENT follow up; continue NEBS/ Advair500/ Spiriva regularly & use the Mucinex; we will request the [facility] Care home COPD program; needs to lose wt & incr exercise... 4/16>  Spirometry w/ severe airflow obstruction and GOLD Stage 3-4 COPD; optimize meds and refer to [facility]- ENT & Pulm divisions to see if there is anything that they can do to help... 5/16>  She has seen ENT & Pulm 2nd opinion is pending; she cannot manipulate the port O2 tanks and we will  request an INOGEN One- port O2 concentrator for her use...  5/16>  States breathing worse, asked to use all regular meds regularly & we will add Depo120, Medrol taper... 6/16>  She had 2nd opinion consult w/ DrStephens/ DrHaponik at [facility] see above...  Hx CP w/ neg cardiac evals including prev Myoviews; 2DEchos w/o incr PA pressures... 4/15> she had Cardiology consult for eval of AtypCP [name]); EKG was WNL & 2DEcho showed norm LVF, Gr1DD & norm RV & PA pressures... 2/16> repeat 2[facility]cho in [facility] showed similar good LVF, valves and norm RV...  OBESITY>  She had remote lap band surg by DrMMartin; Met syndrome, must get wt down etc; she is a lap band  failure & prev counseling by [name]... 3/16> weight down to 255# post hosp... 4/16> weight is back up to 271# despite all efforts...  ? Hx Hypothy>  TFTs remain normal off meds- no problem...  GI>  GERD> she denies symptoms & uses OTC meds prn...  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 C/O right ankle pain, eval by DrZSmith> no [name] on his anti-inflamm rx so we will Rx w/ Depo120 & Medrol taper...  Dysthymia>  Much stress in her life, on Alpraz prn; has embraced alt lifestyle...   Patient's Medications  New Prescriptions   No medications on file  Previous Medications   ALBUTEROL (PROAIR HFA) 108 (90 BASE) MCG/ACT INHALER    Inhale 2 puffs into the lungs every 6 (six) hours as needed.   ALBUTEROL (PROVENTIL HFA;VENTOLIN HFA) 108 (90 BASE) MCG/ACT INHALER    Inhale 2 puffs into the lungs every 6 (six) hours as needed for wheezing or shortness of breath.   ALPRAZOLAM (XANAX) 0.5 MG TABLET    TAKE 1/2 TO 1 TABLET BY MOUTH 3 TIMES DAILY AS NEEDED FOR NERVES. NOT TO EXCEED 3 PER DAY   B COMPLEX-C-FOLIC ACID (B-COMPLEX BALANCED) TABS    Take 1 tablet by mouth daily.   BEE POLLEN 1000 MG TABS    Take 2 tablets by mouth daily.   CETIRIZINE (ZYRTEC) 10 MG TABLET    Take 10 mg by mouth daily.     CHOLECALCIFEROL (VITAMIN D-3) 5000 UNITS TABS    Take 1 tablet by mouth daily.   COCONUT OIL 1000 MG CAPS    Take 4 capsules by mouth daily.   COENZYME Q10 (COQ10) 100 MG CAPS    Take 1 tablet by mouth daily.   COLCHICINE 0.6 MG CAPS    TAKE 1 TABLET TWICE DAILY FOR 5 DAYS   CYCLOBENZAPRINE (FLEXERIL) 10 MG TABLET    Take 1 tablet (10 mg total) by mouth 3 (three) times daily as needed for muscle spasms.   FLUTICASONE-SALMETEROL (ADVAIR) 500-50 MCG/DOSE AEPB    Inhale 1 puff into the lungs 2 (two) times daily.   FUROSEMIDE (LASIX) 40 MG TABLET    Take 1-2 tablets once a day as needed for swelling   KRILL OIL 500 MG CAPS    Take 1 capsule by mouth daily.    LEVALBUTEROL (XOPENEX) 1.25 MG/3ML NEBULIZER SOLUTION    Take 1.25 mg by nebulization 3 (three) times daily.   MAGNESIUM 400 MG CAPS    Take 2 capsules by mouth daily.   METHYLPREDNISOLONE (MEDROL) 2 MG TABLET    Take 6 mg by mouth.   NAPROXEN SODIUM (ANAPROX) 220 MG TABLET    Take 2 tabs by mouth once daily    TIOTROPIUM (SPIRIVA  HANDIHALER) 18 MCG INHALATION CAPSULE    PLACE 1 CAPSULE (18 MCG TOTAL) INTO INHALER AND INHALE DAILY.   TRAMADOL (ULTRAM) 50 MG TABLET    TAKE 1 TABLET BY MOUTH  3 TIMES DAILY   AS NEEDED FOR PAIN   VITAMIN C (ASCORBIC ACID) 250 MG TABLET    Take 250 mg by mouth daily.  Modified Medications   No medications on file  Discontinued Medications   METHYLPREDNISOLONE (MEDROL) 8 MG TABLET    Take 1 tablet (8 mg total) by mouth 2 (two) times daily.   METHYLPREDNISOLONE (MEDROL) 8 MG TABLET    Use as directed

## 2014-09-04 NOTE — Patient Instructions (Signed)
Today we updated your med list in our EPIC system...    Continue your current medications the same...  Be sure to eliminate all the salt/sodium from your diet...  Continue the current plan per [facility] pulmonary dept w/ follow up...  Call for any questions...  Let's plan a follow up visit in 2-68months, sooner if needed for problems.Marland KitchenMarland Kitchen

## 2014-10-24 DIAGNOSIS — J9611 Chronic respiratory failure with hypoxia: Secondary | ICD-10-CM | POA: Diagnosis not present

## 2014-10-24 DIAGNOSIS — J45909 Unspecified asthma, uncomplicated: Secondary | ICD-10-CM | POA: Diagnosis not present

## 2014-10-24 DIAGNOSIS — J449 Chronic obstructive pulmonary disease, unspecified: Secondary | ICD-10-CM | POA: Diagnosis not present

## 2014-10-30 DIAGNOSIS — B379 Candidiasis, unspecified: Secondary | ICD-10-CM | POA: Diagnosis not present

## 2014-10-30 DIAGNOSIS — Z9981 Dependence on supplemental oxygen: Secondary | ICD-10-CM | POA: Diagnosis not present

## 2014-10-30 DIAGNOSIS — E038 Other specified hypothyroidism: Secondary | ICD-10-CM | POA: Diagnosis not present

## 2014-10-30 DIAGNOSIS — Z9081 Acquired absence of spleen: Secondary | ICD-10-CM | POA: Diagnosis not present

## 2014-10-30 DIAGNOSIS — F329 Major depressive disorder, single episode, unspecified: Secondary | ICD-10-CM | POA: Diagnosis not present

## 2014-10-30 DIAGNOSIS — J449 Chronic obstructive pulmonary disease, unspecified: Secondary | ICD-10-CM | POA: Diagnosis not present

## 2014-10-30 DIAGNOSIS — Z87891 Personal history of nicotine dependence: Secondary | ICD-10-CM | POA: Diagnosis not present

## 2014-12-01 ENCOUNTER — Other Ambulatory Visit: Payer: Self-pay | Admitting: Pulmonary Disease

## 2014-12-05 ENCOUNTER — Encounter: Payer: Self-pay | Admitting: Pulmonary Disease

## 2014-12-05 ENCOUNTER — Ambulatory Visit (INDEPENDENT_AMBULATORY_CARE_PROVIDER_SITE_OTHER): Payer: Medicare Other | Admitting: Pulmonary Disease

## 2014-12-05 VITALS — BP 126/70 | HR 84 | Temp 97.9°F | Wt 272.6 lb

## 2014-12-05 DIAGNOSIS — J449 Chronic obstructive pulmonary disease, unspecified: Secondary | ICD-10-CM | POA: Diagnosis not present

## 2014-12-05 DIAGNOSIS — J9611 Chronic respiratory failure with hypoxia: Secondary | ICD-10-CM

## 2014-12-05 DIAGNOSIS — M150 Primary generalized (osteo)arthritis: Secondary | ICD-10-CM

## 2014-12-05 DIAGNOSIS — M159 Polyosteoarthritis, unspecified: Secondary | ICD-10-CM

## 2014-12-05 MED ORDER — BUDESONIDE-FORMOTEROL FUMARATE 160-4.5 MCG/ACT IN AERO: 2.0000 | INHALATION_SPRAY | Freq: Two times a day (BID) | RESPIRATORY_TRACT | Status: DC

## 2014-12-05 NOTE — Progress Notes (Signed)
HPI  Review of Systems  Physical Exam  Patient ID: Rebecca [name], female   DOB: 1950/12/27, 64 y.o.   MRN: [id number] Subjective:   Patient ID: Rebecca [name], female    DOB: 03-22-1950, 64 y.o.   MRN: [id number]  HPI 64 y/o WF known to me w/ mult med problems as noted below...  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  LABS 3/15:  Chems- BS=95 A1c=6.8 (needs low carb diet, exercise, wt loss) & Creat=1.5 (mild renal insuffic due to Lasix rx; Rec to decrease Lasix40 to 1 tab Qam...  2DEcho 4/15 showed norm LV sys function w/ EF=60-65%, normal wall motion, Gr1DD, normal valves, normal RV & PA pressures...  ADDENDUM>>  Rebecca [name] had Cards eval [name] 4/15> he did not feel that invasive testing was warranted; Rebecca [name] had 2DEcho but refused Myoview due to co-pay costs...   ~  November 07, 2013:  66moROV & [name] reports "I'm doing great, just a little arthritis in my mouse hand"; Rebecca [name] also relates a lot of grief over the loss of her 9y/o Min-Pin dog Brixx who ran away several mo ago;  Rebecca [name] reports that Rebecca [name] participated in a medical study out of [place] regarding spacers for folks w/ COPD using MDIs, no ch in her meds required;  Rebecca [name] remains on ADVAIR500Bid, SPIRIVA daily, & ProairHFA prn (uses 3-4 x daily "It really helps");  Rebecca [name] has Home O2 but dislikes the Liq O2 therefore not using & Rebecca [name] wants a portable O2 concentrator to read 4L/min w/ exercise, 2L/min at rest;  Rebecca [name] still doesn't like the nasal cannula, doesn't feel that it gets into her system but like the face mask attachment because Rebecca [name] can feel it [name]- of course we discussed all these issues (again)...     Rebecca [name] has continued to research her supplements and herbal meds>  Rebecca [name] tells me that Rebecca [name] is living on smoothies Rebecca [name] makes out of juice, ice, baking soda ("only a tiny bit"), pomegranate syrup, & local honey;  Tumeric helps the  arthritis in her knees and Rebecca [name] has decreased the amt of aleve Rebecca [name] was using;  Rebecca [name] notes that pinching her upper lip in the middle under her nose helps relieve musc cramps in her legs;  Rebecca [name] states that Rebecca [name] hasn't had any palpit or skip beats since starting on a Magnesium supplement...    Rebecca [name] still sees [name] for help w/ her eating disorder but was a bit upset that he didn't have a diet handout for her;  We gave her the papers we have here to go by... We reviewed prob list, meds, xrays and labs> see below for updates >> Rebecca [name] declines the 2015 Flu vaccine today...  Ambulatory O2 Test 9/15>  O2 sat on RA at rest= 92% w/ pulse=86;  Nadir O2sat on RA after 2 laps= 84% w/ pulse=96...   ~  November 17, 2013:  GArtis Delaypresents for an urgent add-on appt (refused to go to the ER) w/ acute SOB; Rebecca [name] called yest c/o chest tightness, cough, sm amt of whitish sput & wanted something to "dry me up" (given antihist)- Rebecca [name] refused antibiotics and Prednisone; drove herself to the office where Rebecca [name] was found to be tachypneic w/ RR=24, hypoxemic on RA w/ O2sat=83%; Rebecca [name] denies f/c/s, discolored sput, CP; chest exam w/ decr BS, exp wheezing & using accessory muscles; dry cough,  no rales or consolidation...    RX> placed on O2 via mask w/ improvement in sats to 93%; given NEB treatment w/ Xopenex 0.63 x2; given Depo120 as well => improved w/ [name] air movement, decr wheezing, and O2sat up to 97%    REC> to change Advair500 to NEBS w/ ALBUT2.42m Qid (?Xopenex not covered)/ BUDES0.566mBid; Pred 2065m4d tapering sched, Mucinex 1200m37md & fluids... Continue Spiriva daily and home Oxygen... We reviewed prob list, meds, xrays and labs> see below for updates >> reminded of low carb, no sweets, get wt down...  CXR today showed COPD/emphysema, no acute abnormality...  LABS 9/15:  Chems- ok w/ BS=119, A1c=6.7, Cr=1.4;  CBC- ok...  ~  December 15, 2013:  49mo 25mo& [name] iArtis Delayuch improved on the Pred taper=> now down to 1/2 Qod but Rebecca [name] wants  off, feels worse on the day Rebecca [name] takes it Rebecca [name] says; advised slow taper to 1/2 Q3d til gone, and reminded to use the Xopenex/Budes NEB Bid regularly, and the Mucinex 1200mg 65mw/ Fluids...  Rebecca [name] tells me that Rebecca [name] has had some recent dental problems and inquired about a state sponsored dental clinic option but there were no openings unless her doctor wrote a letter indicating that her dental prob was contrib to her medical illness etc=> letter written & given to pt per request...  Rebecca [name] has been using the Xanax but feels it is too strong for her- even 1/2 tab at bedtime gives her a hangover & Rebecca [name] is advised to further decr the dose until Rebecca [name] finds the best combination of rest & no morning side effect... Plan ROV 17mo.  417moecember 17, 2015:  17mo ROV349moil is cArtis Delayleft arm pain- shoulder down to the wrist "it's a nerve" Rebecca [name] says; discomfort present for 2 wks, can't sleep, "hurt so much I cried", tried OTC meds, horse linament, Tramadol, Hydrocodone & none w/ relief; using heating pad which helps some, Rebecca [name] denies neck pain or decr ROM=> we discussed needed referral [facility] [place] [facility]...     Rebecca [name] has weaned off the Pred as noted above but Rebecca [name] also stopped the Xopenex/ Budesonide via NEB because it was too expensive; Rebecca [name] is back on her Advair500-2spBid & Spiriva daily; Rebecca [name] has Albuterol2.5 for the Nebulizer to use prn; Rebecca [name] continues to take Mucinex1200Bid + Fluids; Rebecca [name] notes that her breathing is stable- DOE w/ ADLs and no real improvement, Rebecca [name] has not been able to exercise, or diet, and weight is up to 274# w/ BMI~44;  Rebecca [name] still desaturates w/ activity on RA but Rebecca [name] refuses to wear the oxygen regularly as prescribed- states Rebecca [name] uses it intermittently if SOB & take it off 5-120 min later when Rebecca [name] recovers;  Rebecca [name] has not yet had her dental work;  Zoloft seems to be helping her depression but Rebecca [name] does not want to increase from 50mg to 60mg/d;  51mis still upset at [name] [facility] about her dog Brix  who ran away earlier this yr...     We reviewed prob list, meds, xrays and labs> see below for updates >> Rebecca [name] refuses the 2015 flu vaccine....   ~  May 02, 2014:  2-42mo ROV & 30mo hosp check>  [facility] [name] w/ COPD exac (all records reviewed in [facility] on Levaquin750, Pred taper, NEBS w/ Xopenex, and continued on her Advair500Bid, Spiriva daily, Mucinex, Lasix40 prn >>  2/16 [facility] records indicated a thorough evaluation & excellent care provided but treatment plan was hampered by noncompliance- refused CPAP, declines chestPT, alternately stopping her O2 & demanding more O2,   LABS 2/16:  ABG- pH=7.46, pCO2=35, pO2=134 on 6L/min; Chems- ok x BS=169, A1c=6.5; CBC- normal; TSH=0.39 & FreeT4=1.54; Troponin/ D-dimer/ BNP- all wnl...  CXR 2/16 showed heart at upper lim of norm, hyperinflation, min bibasilar atx, NAD.Marland KitchenMarland Kitchen   EKG 2/16 showed NSR, rate88, low voltage, otherw wnl...  2DEcho 2/16 showed norm LVF w/ EF=55-60%, Gr1DD, norm valves and norm RV...  We reviewed the following medical problems during today's office visit >>     Hx mild OSA w/ mod desat, loud snoring, & leg jerks (on sleep study 2005)> Rebecca [name] tried CPAP10 but stopped on her own & declined to repeat split night study & re-try CPAP rx; Rebecca [name] says Rebecca [name]'s sleeping satis & wakes feeling refreshed...    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 1993; supposed to be on HomeO2- 2L rest & 4L exercise, NEBS w/ AlbutQid, Advair500Bid, Spiriva daily, Mucinex1-2Bid but not taking anything regularly; states that Flutter & Mucinex cause "spasms" in her back; states O2 doesn't help since Rebecca [name] can't breathe thru her nose (Rebecca [name] has been eval by ENT, [name]); finally tried [employer] but stopped after 68moas "fruitless"; prev stated Rebecca [name] "can't breathe at all, can't do anything" etc; SOB w/ ADLs etc; Note> [name] and last PFT (2011) showed FEV1= 1.5 (55%) & %1sec=59 ==> 3/15 Rebecca [name]  announced that Rebecca [name]'s been cured by taking a Magnesium supplement, along w/ herbal formula "Clear lungs" and "Lung tonic"; Rebecca [name] had stopped her Oxygen;  Now using O2 up to 15L/min on her own since Rebecca [name] can't breathe thru her nose- advised 2L/min rest & 4L/min exercise + humidity, nasal hygiene, etc;  Keep Pred 249md for now & ROV 3wks...    Hx AtypCP> baseline EKG w/ minor NSSTTWA; 2DEcho w/ norm LVF, EF=55-60%, Gr1DD, norm valves, norm LA/RV; Myoview 2009 normal w/o ischemia & EF=55%; Rebecca [name] notes some atypCP & saw DrCherly Hensen/15, Rebecca [name] declined Myoview.    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; Rebecca [name] knows not to eat sodium, elev legs, wear support hose; Labs 2/16 showed Cr=1.2-3    Impaired Gluc Tolerance, Metabolic syndrome> Rebecca [name] refused meds; prev eval by DrEllison w/ rec for Metformin Rx but Rebecca [name] rejects the DM diagnosis & refused meds; states "I'm eating healthier" (advised low carb low fat); Labs 2/16 showed BS~160, A1c=6.5...Marland KitchenMarland Kitchen  ?Hypothyroid> not currently on meds but Rebecca [name] really wants thyroid Rx; prev TFTs all wnl; Rebecca [name] prev had Endocrine in [place] prescribe Synthroid for her but Rebecca [name] stopped going; clinically & biochemically euthyroid; Labs 2/16 showed TSH=0.39 & FreeT4=1.54    Morbid Obesity> peak weight ~340# before lap band surg 2009 by DrMMartin; lost to ~250# but then back up to 280-90#; refused f/u w/ [facility] due to cost of visits & lap-band adjustments; 3/16 weight isdown to 255# & we reviewed diet & exercise...    GI- GERD, IBS> Rebecca [name] uses OTC PPI as needed; last colonoscopy was 1982 by DrPatterson & wnl, Rebecca [name] refuses GI referral & refuses f/u colon etc...    GYN- s/p uterine cancer> s[facility] & [facility]    DJD, ?FM, VitD defic> prev on Vicodin, Tramadol, Aleve, VitD; prev eval by DrDeveshwar for rheum; now Rebecca [name] states that [name] ("horse linament") applied topically has worked like a miracle & Rebecca [name] has increased exercise etc...Marland Kitchen  Anxiety/ Depression> on Alprazolam 0.6m prn; prev eval by  Psychiatry w/ seasonal affective disorder; still sees Psychology [name] re: eating disorder; under considerable stress due to relationship w/ sister, financial pressure, etc... PLAN>>  Asked to continue Pred20/d for now, Advair500Bid, Spiriva daily, NEBS w/ Xopenex Bid-Tid, Mucinex600-2Bid, fluids; we will ask DME for humidity on her O2 & switch to pulse dose where able; Rebecca [name] is c/o nasal dryness & we reviewed nasal regimen/hygiene; we offered second/third opinions (eg-DrKozlow), offered Hospice referral, but we ultimately decided on referral to [facility] COPD program... ROV in 3 wks... Note> [facility] & [facility]provider; Hospice required home-bound status and [place]; [facility] ?unable to set up humidity & pulse-dose oxygen...   ~  May 21, 2014:  3wk ROV & recheck> GArtis Delayspent most of this 240m ROV ventilating about her frustration over [facility], her oxygen, the Prednisone, her sister, and life in general; In the interval Rebecca [name] developed "ROID RAGE" because of her Pred Rx at 2070m- her breathing was [name] but Rebecca [name] flew into a rage & discussed this w/ her psychologist, [name]- then called us [place] instructed to wean down the Pred; now on 69m87mand Rebecca [name] is much [name] but Rebecca [name] states "I want off" and we outlined a slow weaning schedule for her over the next month;  Rebecca [name] also reports an interval trip to the ER w/ constipation...     OSA> Rebecca [name] has declined a repeat sleep study, using O2 at night- 2L/min by [place]...    COPD/ Emphysema> on Pred taper (now 69mg54m Advair500Bid, Spiriva daily, Xopenex NEBS Tid, Ventolin HFA prn; based on PFTs in 2011 Rebecca [name] had GOLD Stage 2 COPD, patient group B, but has clearly deteriorated over the last few yrs; [place] and Rebecca [name] appears improved at present but declines to do f/u PFT at this time; Rebecca [name] is very frustrated w/ [facility]- we have tried to get humidifiers set up & pulse-dose apparatus  on her portable equipment (OK to adjust pulse-dose O2 to 3L/min rest & 5L/min exercise)...  We reviewed prob list, meds, xrays and labs> see below for updates >>  PLAN>> We will again try to get [facility] up her humidity & pulse-dose oxygen (OK'd for 3-5L/min flow);  Wean Pred according to slow taper printed on the AVS; ROV in 6wks w/ PFT...  ~  June 05, 2014:  2wk ROV & add-on for SOB> [name] iArtis Delaylways SOB due to her severe GOLD stage 3-4 COPD and her CC is "my life is miserable, I hate my life" Rebecca [name] is angry at the world- her family (esp sister), [employer], Medicare, all of her doctors, etc... Today Rebecca [name] focuses on not being able to breathe thru her nose & reminds me that [name] wanted to operate but I assessed her operative risk too high; says Rebecca [name] can't use her oxygen due to this issue & [facility] has been unable to satisfy her needs in this area (Rebecca [name] states that shje has to wait another 58mo 41moe Medicare will permit changing DME providers)... Rebecca [name] is quite tearful but still able to talk w/ loud volume and full sentences;  Rebecca [name] has mild cough, sm amt beige sput, no hemoptysis, chr stable/ severe SOB/DOE w/ ADLs and any activity, difficulty caring for her pets...     OSA> Rebecca [name] has declined a repeat sleep study, using O2 at night- 2L/min by [place],  occas puts it in her mouth...    COPD/ Emphysema> on Pred taper (now 527m/d), Advair500Bid, Spiriva daily, Xopenex NEBS Tid, Ventolin HFA prn; based on PFTs in 2011 Rebecca [name] had GOLD Stage 2 COPD, patient group B, but has clearly deteriorated over the last few yrs; [place] and Rebecca [name] appears to be stable at present; Rebecca [name] agreed to repeat Spirometry today> c/w GOLD Stage 3-4 COPD (see below)... EXAM shows Afeb, VSS, O2sat=90% on RA in office;  HEENT- nasal congestion, erythema, turbinate hypertrophy, dev septum, throat= Mallampati 2, sl red, no exudates or lesions;  Chest- decr BS bilat w/o w/r/r & no signs of consolidation;  Ext- VI w/ trace edema;   Psyche- very distraught...  Last CXR 2/16 showed heart at upper lim of norm, hyperinflation, min bibasilar atx, NAD  Spirometry 06/2014 showed FVC=1.61 (50%), FEV1=0.88 (35%), %1sec=55, mi-flows are reduced to 18% predicted... 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 PLAN>>  We discussed referral to medical center to consider ENT eval to help her nasal obstruction, & eval by pulmonary team to see if they can think of anything else to help this nice lady...  ~  Jul 02, 2014:  126moOV & [name] reports good days and bad- today is a good day & Rebecca [name] thinks maybe because Rebecca [name] didn't take Xanax last PM (Rebecca [name] will investigate);  [facility]becca [name] are hoping NOT to have to do surg, now on nasal srays + Saline & an ointment, they removed ear wax & plan hearing eval soon;  Rebecca [name] is using a new head-set w/ microphone-like oxygen delivery device instead of nasal cannula;  Rebecca [name] notes some back spasms and using her Flexeril;  Rebecca [name] brought a copy of COPD [employer] & an article that states if a Medicare pt requires a different O2 delivery system, then the DME must comply- Rebecca [name] is unable to use the mult portable tanks due to weight, awkward maneuvering, can't manipulate the controls, etc=> we will request an [employer] portable concentrator for her...    Rebecca [name] remains on Home O2 at 2L/min, Advair500Bid, Spiriva daily, Xopenex NEBS Tid, Ventolin HFA prn;  Chest is clear w/ decr BS bilat, no w/r/r;  Heart shows RR, no m/r/g... We reviewed prob list, meds, xrays and labs> see below for updates >>  PLAN>>  We will request the port oxygen concentrator for her use=> [facility] Rebecca [name] will f/u w/ ENT & [facility]- ROV in 27m75mo  ~  Jul 16, 2014:  2wk ROV & add-on appt requested for mult issues, here w/ her friend PegVickii Chafeday... States Rebecca [name]'s been more SOB for the past week w/ thick mucus, left ankle pain & Rebecca [name] goes into great detail about prev ankle sprain yrs ago, but [name]  Dx prob gout & rec Ibuprofen/ Indocin/ Colchicine but it's still sore w/ decr ROM- I told her we would need to send her to Ortho if Depo120, MEDROL taper (Rebecca [name] won't take Pred due to "rage") didn't resolve the issue... Rebecca [name] states that 2wks ago her breathing was the best it's been in years! But now c/o cough, congestion, incr SOB "it's so bad", says Rebecca [name]'s desat at home & increased her O2 to 8L/min but ran out of tanks fast! Rebecca [name]'s taking OTC Magnesium supplements; O2sat here is 93% on RA!!!  Rebecca [name] 2[facility] opinion [facility]  June... Rebecca [name] tells me about a posting on Facebook about "6 deadly antibiotics" & Levaquin was one of them...     We reviewed prob list, meds, xrays and labs> see below for updates >> Rebecca [name] wants a letter so the post office will bring mail to her door...  LABS 5/16:  Chems- wnl w/ BS=90, Cr=1.02;  Mg=2.2, Ca=9.0;  Uric=6.6.Marland KitchenMarland Kitchen Plan>>  We discussed Rx w/ Depo120, Medrol67m- tapering sched, and to continue regular dosing w/ Home O2 at 2-3L/min, Advair500Bid, Spiriva daily, Xopenex NEBS Tid, Ventolin HFA prn, & Guaifenesin...   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       Pulm second opinion consult 08/14/14 by Dr SGermain Osgood& [facility]Haponik [facility]severe centrilobular emphysema/asthma overlap, ex-smoker quit 1993; chr hypoxemic resp failure on home O2, OSA not on CPAP; c/o dyspnea & can't breathe thru her nose (causing her to not use her oxygen), morbid obesity w/ prev lap band surg (unsuccessful in losing weight); on Medrol8, Advair500Bid, Spiriva daily, Nebs w/ xopenex & AlbutHFA prn; pt notes  that Lasix80 helps her breathe;  Lung exam was clear;  They did PFT 08/21/14 showing FVC=1.63 (50%), FEV1=0.63 (25%), %1sec=39;  FEV1 post bronchodil= 0.97 (39%) for a 50% improvement; DLCO was 22% predicted... CTChest 08/21/14 showed severe centrilobular emphysema, diffuse bronchial thickening, mild mucus plugging, no adenopathy, no lung nodules etc; also showed mild Ao & coronary calcif, lap band at GE junction, splenosis, DJD Tspine... PLAN> They decided to slowly wean the Medrol, continue Advair & wean to Advair250Bid, continue Spiriva, continue NEBS & AlbutHFA; rec to continue her oxygen (but Rebecca [name] continues to use it "prn"), consider repeat sleep study & CPAP titration, [facility], referred for nutritional counseling & ROV 261mo.      Rebecca [name] reports breathing well recently, Rebecca [name] has a new roommate, eating out a lot, went to "theclub", then one day over the weekend=> incr SOB, Rebecca [name]'s been taking Lasix80 Qam, rambling hx w/ flight of ideas regarding coffee, caffeine, not urinating enough, Rebecca [name] had to use [facility] transport to [facility] to get to the clinic;  They decreased her Advair250-Bid, Medrol6m103m, continue Nebs (Rebecca [name]'s using Bid because Rebecca [name] "cramps up" if Rebecca [name] does it tid); Rebecca [name] is awaiting nutritional counseling, pulm rehab referral, oxygen titration testing, sleep study...  We reviewed prob list, meds, xrays and labs> see below for updates >>  PLAN>>  Rebecca [name] will maintain close contact & f/u w/ [facility];  Reminded to avoid sodium & carbs- handout given; plan ROV here in 2-68mo31mo ~  December 05, 2014:  68mo 75mo& [name]'s new PCP is in [place]> Rebecca [name] persists w/ 4+somatic complaints and signmif health related anxiety; Rebecca [name] participated in [facility] [place] 8[facility]1 "they kicked me out due to CP- I can't go back until I get this evaluated"; Rebecca [name]'s been seeing Dr. SarahGermain Osgoodm Fellow [facility]/ [facility] [place] & I have reviewed all the Care Everywhere notes-- several telephone  encounters including their last 45 min conversation well documented, not much reversibility, they hoped for small improvement w/ Rehab  exercise & staying on meds but Rebecca [name] won't fill rx, can't afford, tearful/ frustrated/ depressed but refused psyche consult etc... Pt indicates that DrStephens said there was nothing wrong w/ her heart & pt doesn't want cardiac eval; DrStephens wants her to wean the Medrol but Rebecca [name]'s breathing [name] on 34m/d; Rebecca [name] wants 671md & rec to compromise w/ 74m9mlt w/ 4mg38md; Rebecca [name] wants a liq oxygen system again- Rebecca [name] is quite adamant (being a former "nurInsurance claims handlerhat Rebecca [name] needs 4-5L/min when active and tank only lasts 1-2H; Rebecca [name] uses 4L/min Qhs as well; but O2sat=92% on RA at rest today; Rebecca [name] refuses to go back to rehab, therefore encouraged to do it daily on her own; Rebecca [name] has not pursued further bariatric considerations- Rebecca [name] is working w/ Drlori's office regarding eating disorder "he doesn't think I'm depressed"...      IMP/PLAN>>  [name] Artis Delay severe airflow obstruction/ emphysema w/ little reversibility; Rec to take SYMBICORT160-2spBid & Spiriva daily; use Medrol 4mg 12ms- 74mg a67mw/ 4mg Qo12mXopenex NEBS Tid;  Alprax 0.5mg - 150mto 1 tab tid; ROV w/e in 63mo...  54mo     Problem List:    OBSTRUCTIVE SLEEP APNEA (ICD-327.23) - sleep study 2005 showed RDI=15 w/ desat to 78%, loud snoring & +leg jerks> on CPAP 10, prev used intermittently but now not at all... ~  11/12:  Rebecca [name] has agreed to a new Sleep Study- split night protocol, to recheck her OSA & determine CPAP needs (check ABG if poss too)==> Rebecca [name] never followed thru w/ the study. ~  Pt states that Rebecca [name] is resting satis and wakes refreshed, energy [name]... ~  12/15: Rebecca [name] reports not resting due to left shoulder/ arm pain & we will refer to Ortho (Rebecca [name] saw Dr. Zack SmitGardenia Phlegmhe continues to refuse repeat sleep study, CPAP, and declines f/u w/ sleep med...  [facility] & [facility]> he is working to  get her nares open to facilitate her breathing & nasal O2 Rx...  ~  Spring 2016> ENT eval by DrPlonk & his notes are reviewed> HxCOPD w/ revers component and chr nasal obstruction L>R, nasal crusting, bilat inferior nasal turbinate hypertrophy, etc;  They rec nasal saline, nasal steroid spray, mupirocin ointment, and breathe right nasal strips; they will consider surg if not improved...   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 supposed to be on NEBULIZER Qid, ADVAIR 500Bid + SPIRIVA daily (compliance is a serious issue- Rebecca [name] freq stops all meds) ==> Rebecca [name] [facility]... ~  A1AT level is normal 218 (83-200) 3/09... ~  baseline CXR w/ borderline Cor, COPD, interstitial scarring/ atelectasis/ obesity... ~  PFTs 3/07 showed FVC=2.74 (82%), FEV1=1.67 (62%), %1sec=61, mid-flows=27%pred... improved from 2005. ~  CTChest in 2007 & 4/09 showed severe centrilob emyphysema, + interstitial fibrosis, sm HH, left renal cyst... neg for PE... ~  2010:  breathing improved w/ weight reduction after Lap-band surg... ~  2/11: COPD exac w/ neg CXR (chr changes, NAD), Rx w/ Depo/ Pred/ Avelox/ Mucinex/ NEBS/ Advair/ Spiriva/ etc... ~  PFTs 3/11 showed FVC= 2.57 (73%), FEV1= 1.51 (55%), %1sec=59, mid-flows= 21%pred. ~  Noncompliant w/ Rx & O2 thru 2012... On disability since 2011 & finally got Medicare coverage 9/12... ~  11/12: presents w/ worsening breathing & dyspnea w/ ADLs ==> we outlined further eval w/ 2DEcho, Sleep Study; Rx w/ NEBS QID, Advair500Bid, Spiriva daily,  Mucinex, Oxygen, Lasix, Zoloft, Xanax, etc (Rebecca [name] declined to proceed w/ the sleep study, 2DEcho, etc)... 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 ~  10/13: Rebecca [name] did PulmRehab for 54mo6-7/13 but quit since it was "fruitless"- barely got to do any exercise since it was so difficult getting to the facility,etc; still c/o nasal problems limiting her benefit from O2 & we discussed the poss of ?transtracheal oxygen? ~  9/14: exsmoker quit 1993; supposed to be on HomeO2- 2L rest & 4L exercise, NEBS w/ Albut, Advair500Bid, Spiriva daily, Mucinex1-2Bid but not taking anything regularly; states that Flutter & Mucinex cause "spasms" in her back; states O2 doesn't help since Rebecca [name] can't breathe thru her nose (Rebecca [name] has been eval by ENT, [facility]; [facility]"; states Rebecca [name] "can't breathe at all, can't do anything" etc; SOB w/ ADLs=> O2 recert today (see below)... Note> [name] and last PFT (2011) showed FEV1= 1.5 (55%) & %1sec=59... OXYGEN RE-CERT done today... ~  3/15: see above- pt states that a magnesium supplement along w/ herbal supplements "clear lungs" and "lung tonic" have made all the difference & Rebecca [name] no longer needs oxygen, exercising regularly & much improved; still taking Advair500, Spiriva, AlbutHFA prn...  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 ~  CXR 9/15 showed COPD/emphysema, no acute abnormality... ~  11/07/13: Ambulatory O2 Test 9/15> O2 sat on RA at rest= 92% w/ pulse=86;  Nadir O2sat on RA after 2 laps= 84% w/ pulse=96...  ~  11/17/13: Rebecca [name] presented w/ acute SOB, wheezing, COPD exac> treated w/ NEBS, Depo120, Pred taper, Mucinex1200Bid, fluids, & continue the Spiriva & Oxygen; ch her Advair to NEBS w/ Xopenex & Budesonide regularly... ~  10/15: Rebecca [name] is much improved w/ Pred taper but Rebecca [name] wants off; reminded to use NEBS w/ Xopenex & Budes Bid every day... ~  12/15: Rebecca [name] is off the Pred, but also stopped the Xopenex/ Budesonide due to  cost; Rebecca [name] has gone back on her [employer] Albut via NEBS, Mucinex/ Fluids/ etc... ~  [facility]/ COPD exac> disch on Oxygen, Pred taper, Levaquin750, NEBS w/ Xopenex, Advair500, Spiriva, Mucinex, etc... ~  3/16: post hosp check, very frustrated that Rebecca [name] can't get things the way Rebecca [name] wants them; c/o O2 since Rebecca [name] can't breathe thru nose & we reviewed nasal hygiene, huidified O2, ENT re-eval; on Pred taper, off Levaquin now, Advair500Bid, Spiriva daily, NEBS w/ Xopenex Bid, Mucinex (not taking regularly & asked to do 2Bid + fluids); [facility] & recheck pt in 3 weeks... ~  4/16: Rebecca [name] continues to be very frustrated w/ her disease & her care- can't breathe thru her nose & Rebecca [name] feels the O2 is not doing her any good; we [facility] to see if there is anything they can do.  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 ~  PFT 08/21/14 showing FVC=1.63 (50%), FEV1=0.63 (25%), %1sec=39;  FEV1 post bronchodil= 0.97 (39%) for a 50% improvement; DLCO was 22% predicted...  ~  CTChest 08/21/14 showed severe centrilobular emphysema, diffuse bronchial thickening, mild mucus plugging, no adenopathy, no lung nodules etc; also showed mild Ao & coronary calcif, lap band at GE junction, splenosis, DJD Tspine.   ATYPICAL CHEST PAIN (ICD-786.50) - eval by DrWall in 2009. ~  baseline EKG w/ NSR, PVC's, NSSTTWA, NAD... ~  2DEcho 2/05 showed mild MR/ TR, norm LA/ RV, EF=50-55%... ~  NuclearStressTest 4/09 was norm- no ischemia, EF=55%... similiar to prev studies at [place] & 2007... ~  3/15: Rebecca [name] notes  some atypCP & requests a cardiac referral for eval- prev seen by DrWall, we will call for Cards consult per her request... ~  EKG 3/15 showed NSR, rate91, occas PVCs, otherw wnl... ~  4/15: Rebecca [name] had Cards eval DrNishan> he did not feel that invasive testing was warranted; Rebecca [name] had 2DEcho but refused Myoview due to co-pay costs...  ~  2DEcho 4/15 showed norm LV sys function w/ EF=60-65%, normal wall motion, Gr1DD, normal valves, normal RV & PA pressures... ~  EKG 2/16 showed NSR, rate88, low voltage, otherw wnl... ~  2DEcho 2/16 showed norm LVF w/ EF=55-60%, Gr1DD, norm valves and norm RV.  VENOUS INSUFFICIENCY, CHRONIC (ICD-459.81) - Hx chr ven insuffic w/ edema... on LASIX 23m- 1-2 daily (Rebecca [name] self medicates)... ~  Labs 3/15 showed Cr=1.5 and Rebecca [name] is asked to decr the Lasix to 463mQam...  IMPAIRED GLUCOSE TOLERANCE >>  METABOLIC SYNDROME X (ICCXK-481.8- Rebecca [name] is on diet alone (refused Metformin therapy)... ~  labs 9/08 showed BS=120... FBS 5/08 was 93, HgA1c=7.1 ~  labs 5/10 showed BS= 182, A1c= 6.4 ~  labs 2/11 showed BS= 76, A1c= 6.5 ~  Labs 5/12 showed BS= 103, A1c= 6.5 ~  Rebecca [name] saw DrEllison 4/13 w/ A1c=7.1 but Rebecca [name] rejects the diagnosis of Diabetes & refused Metform50067m... ~  10/13:  We discussed IMPAIRED GLUCOSE TOLERANCE & need for low carb wt reducing diet and incr exercise... ~  9/14: he refused meds; prev eval by DrEllison w/ rec for Metformin Rx but Rebecca [name] rejects the DM diagnosis & refused meds; states "I'm eating healthier" (advised low carb low fat), not checking sugars, prev labs w/ A1c=7.1  ~  3/15: on diet alone; Labs 3/15 showed BS= 95, A1c= 6.8 ~  9/15: on diet alone; Labs 9/15 showed BS= 119, A1c= 6.7 ~  3/16: on diet alone; Labs 2/16 in hosp showed BS~160, A1c=6.5  MORBID OBESITY (ICD-278.01) - Rebecca [name] prev saw [name] for counselling about her eating disorder... ~  max weight ~340# before lap band surg 7/09 by DrMartin. ~  weight 12/09 = 291# ~  weight 5/10 = 265# ~  weight  11/10- = 254# ~  weight 2/11 = 260# ~  Weight 5/12= 290# ~  Weight 11/12 = 286# ~  Weight 4/13 = 280# => BMI=45 ~  Weight 10/13 = 289# ~  Weight 9/14 = 281# ~  Weight 3/15 = 270# ~  Weight 9/15 = 267# ~  Weight 12/15 = 274# ~  Weight 3/16 = 255# => post hosp... ~  Weight 4/16 = 271# ~  Weight 7/16 = 272#  ?  Hx HYPOTHYROIDISM (ICD-244.9) - off thyroid meds since 1/10... Rebecca [name] was started on Synthroid and followed by DrRSmith in [place] & last seen 7/09- note reviewed... Rebecca [name] refuses f/u by [name]- we will recheck TSH off Synthroid Rx... ~  labs 5/10 off Synthroid since 1/10 showed TSH= 1.13 ~  labs 2/11 showed TSH= 1.82 ~  Labs 5/12 showed TSH= 1.81 ~  2013: Rebecca [name] wanted to restart Thyroid hormone but TSH remains normal> referred to Endocrine for further eval but Rebecca [name] was upset w/ DrEllison's eval; Rebecca [name] will decide if Rebecca [name] wants to pursue another opinion... ~  9/14: not currently on meds but Rebecca [name] really wants thyroid Rx; prev TFTs all wnl; Rebecca [name] prev had Endocrine in [place] prescribe Synthroid for her but Rebecca [name] stopped going; clinically & biochemically euthyroid...  ~  3/15: on Synthroid100-12 tab daily; Labs showed TSH= 3.00 ~  Rebecca [name] stopped the Synthroid again... ~  Labs 2/16 not on meds showed TSH=0.39 & FreeT4=1.54   GERD (ICD-530.81) - prev on Nexium but off all meds since 1/10... IRRITABLE BOWEL SYNDROME (ICD-564.1) - colonoscopy 1982 by DrPatterson was WNL... Rebecca [name] has refused f/u GI eval & f/u colonoscopy...  ?KIDNEY STONE Hx >> pt said Rebecca [name] passed a kidney stone 3/11 w/o any medical attention, w/o work up or proof of same; entry made here for future reference if needed.  UTERINE CANCER, HX OF (ICD-V10.42) - s/p laparoscopic hysterectomy & BSO 7/07 by DrSkinner at [place]...   DEGENERATIVE JOINT DISEASE (ICD-715.90) - on VICODIN tid prn,  ANAPROX 230m Bid prn, ULTRAM 5322mPrn... ? of FIBROMYALGIA (ICD-729.1) - Rebecca [name] has been eval by DrDeveshwar for Rheum who felt that Rebecca [name] has fibromyalgia and  DJD... Rebecca [name] was on Wellbutrin & Vicodin when last seen in 2006... VITAMIN D DEFICIENCY (ICD-268.9) - Vit D level 5/10 = 16... rec> start OTC Vit D 2000 u daily. ~  9/14: on Vicodin, Tramadol, Aleve, VitD, etc; prev eval by DrDeveshwar for rheum; now c/o pain in left knee (Rebecca [name] thinks it's "siatica"), can't exercise & [facility]- ok... ~  3/15: Rebecca [name] states that topical [name] ("horse linament") rubbed into knees really helps but Rebecca [name] wants to keep the Vicodinon hand just in case...  Hx of HEADACHE (ICD-784.0)  DYSTHYMIA (ICD-300.4) - off Celexa,  off Zoloft, and on ALPRAZOLAM 0.22m47md as needed... Rebecca [name] has seen psychiatry in the [place] w/ [place]... prev seeing psychologist [name] for eating disorder counselling... ~  4/13: Rebecca [name] mentioned seeing [name] again for eating disorder & counseling for depression etc... 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  ~  3/15: Rebecca [name] has the Alpraz0.22mg 12m prn use but seldom uses it; still sees [name] on occas but doesn't think Rebecca [name] needs to continue... ~  11/15:  Rebecca [name] wants antidepressant med & we discussed Zoloft tria 100mg-37m => 1 tab daily... Rebecca [name] still sees Psychologist [name]. ~  3/16:  Rebecca [name] is off the Zoloft & states the AlprazSharin Graveo strong (advised cutting the med & try lower dose for the desired effect...  HEALTH MAINTENANCE:   ~  GI:  Per seen by DrPatterson but last colon was 1982 & Rebecca [name] refuses follow up... ~  GYN:  Rebecca [name]  Rebecca [name] had Pneumovax in the past; Rebecca [name]  declines Flu shots and Tetanus shots...   Past Surgical History  Procedure Laterality Date  . 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 Appendectomy    . Cholecystectomy  1982  . [place]  . [place]  [place]  . Laparoscopic gastric banding  08/2007    Dr. Hassell Done    Outpatient Encounter Prescriptions as of 12/05/2014  Medication Sig  . albuterol (PROAIR HFA) 108 (90 BASE) MCG/ACT inhaler Inhale 2 puffs into the lungs every 6 (six) hours as needed.  Marland Kitchen albuterol (PROVENTIL HFA;VENTOLIN HFA) 108 (90 BASE) MCG/ACT inhaler Inhale 2 puffs into the lungs every 6 (six) hours as needed for wheezing or shortness of breath.  . ALPRAZolam (XANAX) 0.5 MG tablet TAKE 1/2 TO 1 TABLET BY MOUTH 3 TIMES DAILY AS NEEDED FOR NERVES. NOT TO EXCEED 3 PER DAY  . B Complex-C-Folic Acid (B-COMPLEX BALANCED) TABS Take 1 tablet by mouth daily.  . cetirizine (ZYRTEC) 10 MG tablet Take 10 mg by mouth daily.    . Cholecalciferol (VITAMIN D-3) 5000 UNITS TABS Take 1 tablet by mouth daily.  . Coconut Oil 1000 MG CAPS Take 4 capsules by mouth daily.  . Coenzyme Q10 (COQ10) 100 MG CAPS Take 1 tablet by mouth daily.  . Colchicine 0.6 MG CAPS TAKE 1 TABLET TWICE DAILY FOR 5 DAYS  . cyclobenzaprine (FLEXERIL) 10 MG tablet Take 1 tablet (10 mg total) by mouth 3 (three) times daily as needed for muscle spasms.  . furosemide (LASIX) 40 MG tablet TAKE 1-2 TABLETS ONCE A DAY AS NEEDED FOR SWELLING  . Krill Oil 500 MG CAPS Take 1 capsule by mouth daily.  Marland Kitchen levalbuterol (XOPENEX) 1.25 MG/3ML nebulizer solution Take 1.25 mg by nebulization 3 (three) times daily.  . Magnesium 400 MG CAPS Take 2 capsules by mouth daily.  . methylPREDNISolone (MEDROL) 2 MG tablet Take 4 mg by mouth. Take as directed by physician  . naproxen sodium (ANAPROX) 220 MG tablet Take 2 tabs by mouth once daily   . tiotropium (SPIRIVA HANDIHALER) 18 MCG inhalation capsule PLACE 1 CAPSULE (18 MCG TOTAL) INTO INHALER AND INHALE DAILY.  . traMADol (ULTRAM) 50 MG tablet TAKE 1 TABLET BY MOUTH  3 TIMES DAILY   AS NEEDED FOR PAIN  . vitamin C (ASCORBIC ACID) 250 MG tablet Take 250 mg by mouth  daily.  . [DISCONTINUED] Fluticasone-Salmeterol (ADVAIR) 500-50 MCG/DOSE AEPB Inhale 1 puff into the lungs 2 (two) times daily.  Raelyn Ensign Pollen 1000 MG TABS Take 2 tablets by mouth daily.  . budesonide-formoterol (SYMBICORT) 160-4.5 MCG/ACT inhaler Inhale 2 puffs into the lungs 2 (two) times daily.   No facility-administered encounter medications on file as of 12/05/2014.    Allergies  Allergen Reactions  . Penicillins     REACTION: nausea and vomiting    Current Medications, Allergies, Past Medical History, Past Surgical History, Family History, and Social History were reviewed in [employer] record.   Review of Systems:         See HPI - all other systems neg except as noted... The patient complains of dyspnea on exertion and difficulty walking; chest discomfort, & leg weakness.  The patient denies anorexia, fever, weight loss, weight gain, vision loss, decreased hearing, hoarseness, syncope, prolonged cough, headaches, hemoptysis, abdominal pain, melena, hematochezia, severe indigestion/heartburn, hematuria, incontinence, suspicious skin lesions, transient blindness, depression, unusual weight change, abnormal bleeding, enlarged lymph nodes,  and angioedema.    Objective:   Physical Exam:    WD, Morbidly Obese, 64 y/o WF in no acute distress but c/o dyspnea/ wheezing... GENERAL:  Alert & oriented; pleasant & cooperative... HEENT:  Luck/AT, EOM-wnl, PERRLA, EACs-clear, TMs-wnl, NOSE-clear, THROAT-clear & wnl. NECK:  Supple w/ fairROM; no JVD; normal carotid impulses w/o bruits; no thyromegaly or nodules palpated; no lymphadenopathy. CHEST:  no accessory musc use, decr BS bilat, no wheezing/ rales/ rhonchi... HEART:  Regular Rhythm; without murmurs/ rubs/ or gallops heard... ABDOMEN:  Obese, soft & nontender; normal bowel sounds; no organomegaly or masses detected. EXT: without deformities, mod arthritic changes; no varicose veins/ +venous insuffic/trace  edema. NEURO:  CNs intact; no focal neuro deficits... DERM: few ecchymoses, no rash etc...  RADIOLOGY DATA:  Reviewed in the EPIC EMR & discussed w/ the patient...  LABORATORY DATA:  Reviewed in the EPIC EMR & discussed w/ the patient...   Assessment & Plan:    ENT>> Rebecca [name] is working w/ DrPlonk at [employer]...  OSA>  As prev noted Rebecca [name] needs new sleep study & appropriate new CPAP apparatus to facilitate compliance but Rebecca [name] declines the repeat study or sleep med referral...  COPD/ Emphysema>  Acute exac 2/16 improved after hosp in [place] w/ Levaquin, Pred taper, NEBS Bid, Avair500, Spiriva, Mucinex etc... Encouraged to stay on her O2 regularly & we will add Humidity, needs max nasal hygiene vs ENT follow up; continue NEBS/ Advair500/ Spiriva regularly & use the Mucinex; we will [facility] home Care home COPD program; needs to lose wt & incr exercise... 4/16>  Spirometry w/ severe airflow obstruction and GOLD Stage 3-4 COPD; optimize meds and refer to [facility]- E[facility] & Pulm divisions to see if there is anything that they can do to help... 5/16>  Rebecca [name] has seen ENT & Pulm 2nd opinion is pending; Rebecca [name] cannot manipulate the port O2 tanks and we will request an INOGEN One- port O2 concentrator for her use...  5/16>  States breathing worse, asked to use all regular meds regularly & we will add Depo120, Medrol taper... 6/16>  Rebecca [name] had 2nd opinion consult w/ DrStephens/ DrHaponik [facility] see above... 10/16>  Pt very frustrated after working w/ the [employer] division at [employer] for several months- Nebs, Advair500, Spiriva, Medrol, Oxygen, pulm rehab, etc...   Hx CP w/ neg cardiac evals including prev Myoviews; 2DEchos w/o incr PA pressures... 4/15> Rebecca [name] had Cardiology consult for eval of AtypCP [name]); EKG was WNL & 2DEcho showed norm LVF, Gr1DD & norm RV & PA pressures... 2/16> repeat 2DEcho in [facility] showed similar good LVF, valves and norm RV...  OBESITY>  Rebecca [name] had remote lap band surg by DrMMartin; Met  syndrome, must get wt down etc; Rebecca [name] is a lap band failure & prev counseling by [name]... 3/16> weight down to 255# post hosp... 4/16> weight is back up to 271# despite all efforts...  ? Hx Hypothy>  TFTs remain normal off meds- no problem...  GI>  GERD> Rebecca [name] denies symptoms & uses OTC meds prn...  DJD>  Rebecca [name] has DJD, ?FM, etc;  On Vicodin, Tramadol, OTC NSAIDs as needed... C/O right ankle pain, eval by DrZSmith> no [name] on his anti-inflamm rx so we will Rx w/ Depo120 & Medrol taper...  Dysthymia>  Much stress in her life, on Alpraz prn; has embraced alt lifestyle...   Patient's Medications  New Prescriptions   BUDESONIDE-FORMOTEROL (SYMBICORT) 160-4.5 MCG/ACT INHALER    Inhale 2 puffs into the lungs 2 (two) times daily.  Previous Medications   ALBUTEROL (PROAIR HFA) 108 (90 BASE) MCG/ACT INHALER    Inhale 2 puffs into the lungs every 6 (six) hours as needed.   ALBUTEROL (PROVENTIL HFA;VENTOLIN HFA) 108 (90 BASE) MCG/ACT INHALER    Inhale 2 puffs into the lungs every 6 (six) hours as needed for wheezing or shortness of breath.   ALPRAZOLAM (XANAX) 0.5 MG TABLET    TAKE 1/2 TO 1 TABLET BY MOUTH 3 TIMES DAILY AS NEEDED FOR NERVES. NOT TO EXCEED 3 PER DAY   B COMPLEX-C-FOLIC ACID (B-COMPLEX BALANCED) TABS    Take 1 tablet by mouth daily.   BEE POLLEN 1000 MG TABS    Take 2 tablets by mouth daily.   CETIRIZINE (ZYRTEC) 10 MG TABLET    Take 10 mg by mouth daily.     CHOLECALCIFEROL (VITAMIN D-3) 5000 UNITS TABS    Take 1 tablet by mouth daily.   COCONUT OIL 1000 MG CAPS    Take 4 capsules by mouth daily.   COENZYME Q10 (COQ10) 100 MG CAPS    Take 1 tablet by mouth daily.   COLCHICINE 0.6 MG CAPS    TAKE 1 TABLET TWICE DAILY FOR 5 DAYS   CYCLOBENZAPRINE (FLEXERIL) 10 MG TABLET    Take 1 tablet (10 mg total) by mouth 3 (three) times daily as needed for muscle spasms.   FUROSEMIDE (LASIX) 40 MG TABLET    TAKE 1-2 TABLETS ONCE A DAY AS NEEDED FOR SWELLING   KRILL OIL 500 MG CAPS    Take 1  capsule by mouth daily.   LEVALBUTEROL (XOPENEX) 1.25 MG/3ML NEBULIZER SOLUTION    Take 1.25 mg by nebulization 3 (three) times daily.   MAGNESIUM 400 MG CAPS    Take 2 capsules by mouth daily.   METHYLPREDNISOLONE (MEDROL) 2 MG TABLET    Take 4 mg by mouth. Take as directed by physician   NAPROXEN SODIUM (ANAPROX) 220 MG TABLET    Take 2 tabs by mouth once daily    TIOTROPIUM (SPIRIVA HANDIHALER) 18 MCG INHALATION CAPSULE    PLACE 1 CAPSULE (18 MCG TOTAL) INTO INHALER AND INHALE DAILY.   TRAMADOL (ULTRAM) 50 MG TABLET    TAKE 1 TABLET BY MOUTH  3 TIMES DAILY   AS NEEDED FOR PAIN   VITAMIN C (ASCORBIC ACID) 250 MG TABLET    Take 250 mg by mouth daily.  Modified Medications   No medications on file  Discontinued Medications   FLUTICASONE-SALMETEROL (ADVAIR) 500-50 MCG/DOSE AEPB    Inhale 1 puff into the lungs 2 (two) times daily.

## 2014-12-05 NOTE — Patient Instructions (Signed)
Today we updated your med list in our EPIC system...    Continue your current medications the same...  We decided to change the ADVAIR (which is not covered by your insurance) to [place] twice daily (to see if this is better covered by your insurance)...    Ask your insurance company for a copy of their [place] so we can review this...  We also decided to bump your MEDROL 4mg  tabs to 6mg  alt w/ 4mg  every other day (ie- 6- 4- 6- 4- etc)...  Call for any questions...  Let's plan a follow up visit in 30mo, sooner if needed for problems.Marland KitchenMarland Kitchen

## 2014-12-20 ENCOUNTER — Telehealth: Payer: Self-pay | Admitting: Pulmonary Disease

## 2014-12-20 MED ORDER — METHYLPREDNISOLONE 2 MG PO TABS: 4.0000 mg | ORAL_TABLET | Freq: Every day | ORAL | Status: DC

## 2014-12-20 NOTE — Telephone Encounter (Signed)
Last ov on 12/05/14 with SN  Patient Instructions     Today we updated your med list in our EPIC system...  Continue your current medications the same...  We decided to change the ADVAIR (which is not covered by your insurance) to [place] twice daily (to see if this is better covered by your insurance)...  Ask your insurance company for a copy of their [place] so we can review this...  We also decided to bump your MEDROL 4mg  tabs to 6mg  alt w/ 4mg  every other day (ie- 6- 4- 6- 4- etc)...  Call for any questions...  Let's plan a follow up visit in 4mo, sooner if needed for problems...    Called and spoke with pt. [facility]ified [name] [place]. Rx sent per pt's last ov note on 12/05/14. Pt voiced understanding and had no further questions. Nothing further needed

## 2014-12-27 DIAGNOSIS — H2513 Age-related nuclear cataract, bilateral: Secondary | ICD-10-CM | POA: Diagnosis not present

## 2015-01-02 ENCOUNTER — Telehealth: Payer: Self-pay | Admitting: Family Medicine

## 2015-01-02 MED ORDER — PREDNISONE 50 MG PO TABS: 50.0000 mg | ORAL_TABLET | Freq: Every day | ORAL | Status: DC

## 2015-01-02 MED ORDER — ALLOPURINOL 100 MG PO TABS: 100.0000 mg | ORAL_TABLET | Freq: Every day | ORAL | Status: DC

## 2015-01-02 NOTE — Telephone Encounter (Signed)
Patient states she has seen Dr. Tamala Julian for gout in her right foot.  She has a flare up right now.  She does not want to wait until next week to be seen.  She would like to know if she can have something called in to her pharmacy or be seen sooner.

## 2015-01-02 NOTE — Telephone Encounter (Signed)
Spoke to pt, she is going to wait to start the prednisone after discussing with Dr. Lenna Gilford on Monday. Pt stated she will go ahead & pick up the allopurinol & start that. Made an appt to see dr Tamala Julian on11.9.16.

## 2015-01-02 NOTE — Telephone Encounter (Signed)
Sent in prednisone daily for 5 days Start allopurionl as well to decrease uric acid.

## 2015-01-07 ENCOUNTER — Encounter: Payer: Self-pay | Admitting: Pulmonary Disease

## 2015-01-07 ENCOUNTER — Ambulatory Visit (INDEPENDENT_AMBULATORY_CARE_PROVIDER_SITE_OTHER): Payer: Medicare Other | Admitting: Pulmonary Disease

## 2015-01-07 VITALS — BP 110/66 | HR 87 | Temp 97.9°F | Wt 275.8 lb

## 2015-01-07 DIAGNOSIS — J9611 Chronic respiratory failure with hypoxia: Secondary | ICD-10-CM

## 2015-01-07 DIAGNOSIS — J449 Chronic obstructive pulmonary disease, unspecified: Secondary | ICD-10-CM

## 2015-01-07 DIAGNOSIS — M159 Polyosteoarthritis, unspecified: Secondary | ICD-10-CM

## 2015-01-07 DIAGNOSIS — M150 Primary generalized (osteo)arthritis: Secondary | ICD-10-CM

## 2015-01-07 NOTE — Progress Notes (Signed)
HPI  Review of Systems  Physical Exam  Patient ID: Rebecca [name], female   DOB: 10/31/1950, 64 y.o.   MRN: [id number] Subjective:   HPI 64 y/o WF known to me w/ mult med problems as noted below...  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  LABS 3/15:  Chems- BS=95 A1c=6.8 (needs low carb diet, exercise, wt loss) & Creat=1.5 (mild renal insuffic due to Lasix rx; Rec to decrease Lasix40 to 1 tab Qam...  2DEcho 4/15 showed norm LV sys function w/ EF=60-65%, normal wall motion, Gr1DD, normal valves, normal RV & PA pressures...   she had Cards eval [name] 4/15> he did not feel that invasive testing was warranted; she had 2DEcho but refused Myoview due to co-pay costs...   She remains on ADVAIR500Bid, SPIRIVA daily, & ProairHFA prn (uses 3-4 x daily "It really helps"); has Home O2 but dislikes the Liq O2 therefore not using & she wants a portable O2 concentrator to read 4L/min w/ exercise, 2L/min at rest.   She has continued to research her supplements and herbal meds>  She tells me that she is living on smoothies she makes out of juice, ice, baking soda, pomegranate syrup, & local honey;  Tumeric helps the arthritis in her knees and she notes that pinching her upper lip in the middle under her nose helps relieve musc cramps in her legs.  Ambulatory O2 Test 9/15>  O2 sat on RA at rest= 92% w/ pulse=86;  Nadir O2sat on RA after 2 laps= 84% w/ pulse=96...   CXR 9/15 showed COPD/emphysema, no acute abnormality...  LABS 9/15:  Chems- ok w/ BS=119, A1c=6.7, Cr=1.4;  CBC- ok...  ~  May 02, 2014:  2-80moROV & post hosp check>  [facility] w/ COPD exac ([place]  Disch on Levaquin750, Pred taper, NEBS w/ Xopenex, and continued on her Advair500Bid, Spiriva daily, Mucinex, Lasix40 prn >>    2/16 [facility] records indicated a thorough evaluation & excellent care provided but treatment plan was hampered by noncompliance- refused CPAP, declines chestPT, alternately stopping her O2 & demanding more O2,   LABS 2/16:  ABG- pH=7.46, pCO2=35, pO2=134 on 6L/min; Chems- ok x BS=169, A1c=6.5; CBC- normal; TSH=0.39 & FreeT4=1.54; Troponin/ D-dimer/ BNP- all wnl...  CXR 2/16 showed heart at upper lim of norm, hyperinflation, min bibasilar atx, NAD..Marland KitchenMarland Kitchen  EKG 2/16 showed NSR, rate88, low voltage, otherw wnl...  2DEcho 2/16 showed norm LVF w/ EF=55-60%, Gr1DD, norm valves and norm RV...  We reviewed the following medical problems during today's office visit >>     Hx mild OSA w/ mod desat, loud snoring, & leg jerks (on sleep study 2005)> she tried CPAP10 but stopped on her own & declined to repeat split night study & re-try CPAP rx; she says she's sleeping satis & wakes feeling refreshed...    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 1993; supposed to be on HomeO2- 2L rest & 4L exercise, NEBS w/ AlbutQid, Advair500Bid, Spiriva daily, Mucinex1-2Bid but not taking anything regularly; states that Flutter & Mucinex cause "spasms" in her back; states O2 doesn't help since she can't breathe thru her nose (she has been eval by ENT, CErnesto Rutherford; [facility] tried [facility] after 226mos "fruitless"; prev stated she "can't breathe at all, can't do anything" etc; SOB  w/ ADLs etc; Note> she was eval at [facility] [place] in 2006 and last PFT (2011) showed FEV1= 1.5 (55%) & %1sec=59 ==> 3/15 she announced that she's been cured by taking a Magnesium supplement, along w/ herbal formula "Clear lungs" and "Lung tonic"; she had stopped her Oxygen;  Now using O2 up to 15L/min on her own since she can't breathe thru her nose- advised 2L/min rest & 4L/min exercise + humidity, nasal hygiene, etc;  Keep Pred 68m/d for now & ROV 3wks...    Hx AtypCP> baseline EKG w/ minor NSSTTWA; 2DEcho w/ norm LVF, EF=55-60%, Gr1DD, norm valves, norm  LA/RV; Myoview 2009 normal w/o ischemia & EF=55%; she notes some atypCP & saw DCherly Hensen4/15, she declined Myoview.    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; she knows not to eat sodium, elev legs, wear support hose; Labs 2/16 showed Cr=1.2-3    Impaired Gluc Tolerance, Metabolic syndrome> she refused meds; prev eval by DrEllison w/ rec for Metformin Rx but she rejects the DM diagnosis & refused meds; states "I'm eating healthier" (advised low carb low fat); Labs 2/16 showed BS~160, A1c=6.5..Marland KitchenMarland Kitchen   ?Hypothyroid> not currently on meds but she really wants thyroid Rx; prev TFTs all wnl; she prev had Endocrine in [place] prescribe Synthroid for her but she stopped going; clinically & biochemically euthyroid; Labs 2/16 showed TSH=0.39 & FreeT4=1.54    Morbid Obesity> peak weight ~340# before lap band surg 2009 by DrMMartin; lost to ~250# but then back up to 280-90#; refused f/u w/ [facility] due to cost of visits & lap-band adjustments; 3/16 weight isdown to 255# & we reviewed diet & exercise...    GI- GERD, IBS> she uses OTC PPI as needed; last colonoscopy was 1982 by DrPatterson & wnl, she refuses GI referral & refuses f/u colon etc...    GYN- s/p uterine cancer> [facility]    DJD, ?FM, VitD defic> prev on Vicodin, Tramadol, Aleve, VitD; prev eval by DrDeveshwar for rheum; now she states that [name] ("horse linament") applied topically has worked like a miracle & she has increased exercise etc...    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 PLAN>>  Asked to continue Pred20/d for now, Advair500Bid, Spiriva daily, NEBS w/ Xopenex Bid-Tid, Mucinex600-2Bid, fluids; we will ask DME for humidity on her O2 & switch to pulse dose where able; she is c/o nasal dryness & we reviewed nasal regimen/hygiene; we  offered second/third opinions (eg-DrKozlow), offered Hospice referral, but we ultimately decided on referral to [facility]... ROV in 3 wks... Note> [facility] due [facility] & [facility]; Hospice required home-bound status and an [place]; [facility] ?unable to set up humidity & pulse-dose oxygen...   ~  May 21, 2014:  3wk ROV & recheck> Rebecca Delaypent most of this 2030mROV ventilating about her frustration over [facility], her oxygen, the Prednisone, her sister, and life in general; In the interval she developed "ROID RAGE" because of her Pred Rx at 58m37m her breathing was better but she flew into a rage & discussed this w/ her psychologist, [name]- then called us &[place] instructed to wean down the Pred; now on 10mg36mnd she is much better but she states "I want off" and we outlined a  slow weaning schedule for her over the next month;  She also reports an interval trip to the ER w/ constipation...     OSA> she has declined a repeat sleep study, using O2 at night- 2L/min by Norco...    COPD/ Emphysema> on Pred taper (now 51m/d), Advair500Bid, Spiriva daily, Xopenex NEBS Tid, Ventolin HFA prn; based on PFTs in 2011 she had GOLD Stage 2 COPD, patient group B, but has clearly deteriorated over the last few yrs; [place] as noted and she appears improved at present but declines to do f/u PFT at this time; she is very frustrated w/ [facility]- we have tried to get humidifiers set up & pulse-dose apparatus on her portable equipment (OK to adjust pulse-dose O2 to 3L/min rest & 5L/min exercise)...  We reviewed prob list, meds, xrays and labs> see below for updates >>  PLAN>> We will again try to get [facility]to [facility] her humidity & pulse-dose oxygen (OK'd for 3-5L/min flow);  Wean Pred according to slow taper printed on the AVS; ROV in 6wks w/ PFT...  ~  June 05, 2014:  2wk ROV & add-on for SOB> GArtis Delayis always SOB due to her severe GOLD  stage 3-4 COPD and her CC is "my life is miserable, I hate my life" she is angry at the world- her family (esp sister), [employer], Medicare, all of her doctors, etc... Today she focuses on not being able to breathe thru her nose & reminds me that [name] wanted to operate but I assessed her operative risk too high; says she can't use her oxygen due to this issue & [facility] has been unable to satisfy her needs in this area (she states that shje has to wait another 153moefore Medicare will permit changing DME providers)... She is quite tearful but still able to talk w/ loud volume and full sentences;  She has mild cough, sm amt beige sput, no hemoptysis, chr stable/ severe SOB/DOE w/ ADLs and any activity, difficulty caring for her pets...     OSA> she has declined a repeat sleep study, using O2 at night- 2L/min by [place], occas puts it in her mouth...    COPD/ Emphysema> on Pred taper (now 1019m), Advair500Bid, Spiriva daily, Xopenex NEBS Tid, Ventolin HFA prn; based on PFTs in 2011 she had GOLD Stage 2 COPD, patient group B, but has clearly deteriorated over the last few yrs; [place] as noted and she appears to be stable at present; she agreed to repeat Spirometry today> c/w GOLD Stage 3-4 COPD (see below)... EXAM shows Afeb, VSS, O2sat=90% on RA in office;  HEENT- nasal congestion, erythema, turbinate hypertrophy, dev septum, throat= Mallampati 2, sl red, no exudates or lesions;  Chest- decr BS bilat w/o w/r/r & no signs of consolidation;  Ext- VI w/ trace edema;  Psyche- very distraught...  Last CXR 2/16 showed heart at upper lim of norm, hyperinflation, min bibasilar atx, NAD  Spirometry 06/2014 showed FVC=1.61 (50%), FEV1=0.88 (35%), %1sec=55, mi-flows are reduced to 18% predicted... 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 PLAN>>  We discussed referral to medical center to consider ENT eval to help her nasal obstruction, & eval by pulmonary team to see if they can think  of anything else to help this nice lady...  ~  Jul 02, 2014:  74mo65mo & [name] reports good days and bad- today is a good day & she thinks maybe because she didn't take Xanax last  PM (she will investigate);  She was seen by ENT at [facility] & they are hoping NOT to have to do surg, now on nasal srays + Saline & an ointment, they removed ear wax & plan hearing eval soon;  She is using a new head-set w/ microphone-like oxygen delivery device instead of nasal cannula;  She notes some back spasms and using her Flexeril;  She brought a copy of COPD Digest & an article that states if a Medicare pt requires a different O2 delivery system, then the DME must comply- she is unable to use the mult portable tanks due to weight, awkward maneuvering, can't manipulate the controls, etc=> we will request an [employer] ONE portable concentrator for her...    She remains on Home O2 at 2L/min, Advair500Bid, Spiriva daily, Xopenex NEBS Tid, Ventolin HFA prn;  Chest is clear w/ decr BS bilat, no w/r/r;  Heart shows RR, no m/r/g... We reviewed prob list, meds, xrays and labs> see below for updates >>  PLAN>>  We will request the port oxygen concentrator for her use=> letter written & sent to [facility];  She will f/u w/ ENT & has Pulm 2nd opinion at [facility] in June- ROV in 60mo..  ~  Jul 16, 2014:  2wk ROV & add-on appt requested for mult issues, here w/ her friend PVickii Chafetoday... States she's been more SOB for the past week w/ thick mucus, left ankle pain & she goes into great detail about prev ankle sprain yrs ago, but [name] Dx prob gout & rec Ibuprofen/ Indocin/ Colchicine but it's still sore w/ decr ROM- I told her we would need to send her to Ortho if Depo120, MEDROL taper (she won't take Pred due to "rage") didn't resolve the issue... She states that 2wks ago her breathing was the best it's been in years! But now c/o cough, congestion, incr SOB "it's so bad", says she's desat at home & increased her O2 to 8L/min but ran out of tanks fast! She's  taking OTC Magnesium supplements; O2sat here is 93% on RA!!!  [facility] and has appt for pulmonary 2nd opinion in June... She tells me about a posting on Facebook about "6 deadly antibiotics" & Levaquin was one of them...     We reviewed prob list, meds, xrays and labs> see below for updates >> she wants a letter so the post office will bring mail to her door...  LABS 5/16:  Chems- wnl w/ BS=90, Cr=1.02;  Mg=2.2, Ca=9.0;  Uric=6.6..Marland KitchenMarland KitchenPlan>>  We discussed Rx w/ Depo120, Medrol823m tapering sched, and to continue regular dosing w/ Home O2 at 2-3L/min, Advair500Bid, Spiriva daily, Xopenex NEBS Tid, Ventolin HFA prn, & Guaifenesin...   ~  September 04, 2014:  6wk ROV & Rebecca Delayas [facility]>>      [facility]> HxCOPD w/ revers component and chr nasal obstruction L>R, nasal crusting, bilat inferior nasal turbinate hypertrophy, etc;  They rec nasal saline, nasal steroid spray, mupirocin ointment, and breathe right nasal strips; they will consider surg if not improved...       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 CTChest 08/21/14 showed severe centrilobular emphysema, diffuse bronchial thickening, mild mucus plugging, no adenopathy, no lung nodules etc; also showed mild Ao & coronary calcif, lap band at GE junction, splenosis, DJD  Tspine... PLAN> They decided to slowly wean the Medrol, continue Advair & wean to Advair250Bid, continue Spiriva, continue NEBS & AlbutHFA; rec to continue her oxygen (but she continues to use it "prn"), consider repeat sleep study & CPAP titration, consider referral to [facility], referred for nutritional counseling & ROV 47mo..      She reports breathing well recently, she has a new roommate, eating out a lot, went to "theclub", then one day over the weekend=> incr SOB, she's been taking Lasix80 Qam, rambling hx w/ flight of ideas regarding coffee, caffeine, not urinating enough, she had to use [facility] transport to [place] to get to the clinic;  They decreased her Advair250-Bid, Medrol655md, continue Nebs (she's using Bid because she "cramps up" if she does it tid); she is awaiting nutritional counseling, pulm rehab referral, oxygen titration testing, sleep study...  We reviewed prob list, meds, xrays and labs> see below for updates >>  PLAN>>  She will maintain close contact & f/u w/ [facility];  Reminded to avoid sodium & carbs- handout given; plan ROV here in 2-25m28mo  ~  December 05, 2014:  25mo280mo & [name]'s new PCP is in [place]> she persists w/ 4+somatic complaints and signif health related anxiety; [facility] "they kicked me out due to CP- I can't go back until I get this evaluated"; she's been seeing Dr. SaraGermain Osgoodlm Fellow w/ [facility] & I have reviewed all the Care Everywhere notes-- several telephone encounters including their last 45 min conversation well documented, not much reversibility, they hoped for small improvement w/ Rehab exercise & staying on meds but she won't fill rx, can't afford, tearful/ frustrated/ depressed but refused psyche consult etc... Pt indicates that DrStephens said there was nothing wrong w/ her heart & pt doesn't want cardiac eval; DrStephens wants her to wean the Medrol but she's breathing  better on 4mg/57mshe wants 6mg/d37mrec to compromise w/ 6mg al36m/ 4mg Qod61mhe wants a liq oxygen system again- she is quite adamant (being a former "nurse")Insurance claims handlershe needs 4-5L/min when active and tank only lasts 1-2H; she uses 4L/min Qhs as well; but O2sat=92% on RA at rest today; she refuses to go back to rehab, therefore encouraged to do it daily on her own; she has not pursued further bariatric considerations- she is working w/ Drlori's office regarding eating disorder "he doesn't think I'm depressed"...      IMP/PLAN>>  [name] has Artis Delayere airflow obstruction/ emphysema w/ little reversibility; Rec to take SYMBICORT160-2spBid & Spiriva daily; use Medrol 4mg tabs59mmg alt w75mmg Qod; X71mnex NEBS Tid;  Alprax 0.5mg - 1/2 t18m tab tid; ROV w/ me in 80mo...  ~  N82mober 7, 2016:  80mo ROV & pt 72momult complaints> head "stopped-up", ears congested, tried pseudophed but "it made me sick", doesn't want ENT f/u, rec to try Saline nasal mist & phenylephrine decongestant;  She is  discouraged by her weight, can't seem to lose any, she described in detail her meals from yest (we discussed calories "in" & "out", eat less, burn more), she says local Gym won't take her w/ oxygen;  She asked about Stem Cell treatment for COPD- I offered to send her to [facility] to inquire about this or lung transplant;  She is very frustrated- eating is an issue because when she fills up she can't breathe well due to her prev LapBand surg she says, if she belches she feels better...       Rec to continue Medrol70m- she incr herself to 662md, Symbicort160-2spBid, Spiriva daily, Xopenex NEBS Tid (only doing it once/d), Guaifenesin400- taking 6/d w/ fluids, O2- she is not using it at rest, using 5L/min w/ exercise; she takes Lasix40, Tamadol50, & Xanax0.5 as needed...            Problem List:    OBSTRUCTIVE SLEEP APNEA (ICD-327.23) - sleep study 2005 showed RDI=15 w/ desat to 78%, loud snoring & +leg jerks> on CPAP 10, prev used intermittently  but now not at all... ~  11/12:  She has agreed to a new Sleep Study- split night protocol, to recheck her OSA & determine CPAP needs (check ABG if poss too)==> she never followed thru w/ the study. ~  Pt states that she is resting satis and wakes refreshed, energy better... ~  12/15: she reports not resting due to left shoulder/ arm pain & we will refer to Ortho (she saw Dr. ZaGardenia Phlegm.. ~  She continues to refuse repeat sleep study, CPAP, and declines f/u w/ sleep med...  [facility]> he is working to get her nares open to facilitate her breathing & nasal O2 Rx...  ~  Spring 2016> ENT eval by DrPlonk & his notes are reviewed> HxCOPD w/ revers component and chr nasal obstruction L>R, nasal crusting, bilat inferior nasal turbinate hypertrophy, etc;  They rec nasal saline, nasal steroid spray, mupirocin ointment, and breathe right nasal strips; they will consider surg if not improved...   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 supposed to be on NEBULIZER Qid, ADVAIR 500Bid + SPIRIVA daily (compliance is a serious issue- she freq stops all meds) ==> she [facility]... ~  A1AT level is normal 218 (83-200) 3/09... ~  baseline CXR w/ borderline Cor, COPD, interstitial scarring/ atelectasis/ obesity... ~  PFTs 3/07 showed FVC=2.74 (82%), FEV1=1.67 (62%), %1sec=61, mid-flows=27%pred... improved from 2005. ~  CTChest in 2007 & 4/09 showed severe centrilob emyphysema, + interstitial fibrosis, sm HH, left renal cyst... neg for PE... ~  2010:  breathing improved w/ weight reduction after Lap-band surg... ~  2/11: COPD exac w/ neg CXR (chr changes, NAD), Rx w/ Depo/ Pred/ Avelox/ Mucinex/ NEBS/ Advair/ Spiriva/ etc... ~  PFTs 3/11 showed FVC= 2.57 (73%), FEV1= 1.51 (55%), %1sec=59, mid-flows= 21%pred. ~  Noncompliant w/ Rx & O2 thru 2012... On disability since 2011 & finally  got Medicare coverage 9/12... ~  11/12: presents w/ worsening breathing & dyspnea w/ ADLs ==> we outlined further eval w/ 2DEcho, Sleep Study; Rx w/ NEBS QID, Advair500Bid, Spiriva daily, Mucinex, Oxygen, Lasix, Zoloft, Xanax, etc (she declined to proceed w/ the sleep study, 2DEcho, etc)... 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  ~  4/13: she is c/o the nasal O2  not helping since it's hard to breathe thru nose & wants ENT referral==> saw [name] who wanted to do surg but she has severe COPD & hi risk; asked to start PulmRehab & she agrees... ~  10/13: she did PulmRehab for 86mo6-7/13 but quit since it was "fruitless"- barely got to do any exercise since it was so difficult getting to the facility,etc; still c/o nasal problems limiting her benefit from O2 & we discussed the poss of ?transtracheal oxygen? ~  9/14: exsmoker quit 1993; supposed to be on HomeO2- 2L rest & 4L exercise, NEBS w/ Albut, Advair500Bid, Spiriva daily, Mucinex1-2Bid but not taking anything regularly; states that Flutter & Mucinex cause "spasms" in her back; states O2 doesn't help since she can't breathe thru her nose (she has been eval by ENT, CErnesto Rutherford; finally tried [facility] Netbut stopped after 255mos "fruitless"; states she "can't breathe at all, can't do anything" etc; SOB w/ ADLs=> O2 recert today (see below)... Note> [facility] and last PFT (2011) showed FEV1= 1.5 (55%) & %1sec=59... OXYGEN RE-CERT done today... ~  3/15: see above- pt states that a magnesium supplement along w/ herbal supplements "clear lungs" and "lung tonic" have made all the difference & she no longer needs oxygen, exercising regularly & much improved; still taking Advair500, Spiriva, AlbutHFA prn...  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 ~  CXR 9/15 showed COPD/emphysema,  no acute abnormality... ~  11/07/13: Ambulatory O2 Test 9/15> O2 sat on RA at rest= 92% w/ pulse=86;  Nadir O2sat on RA after 2 laps= 84% w/ pulse=96...  ~  11/17/13: she presented w/ acute SOB, wheezing, COPD exac> treated w/ NEBS, Depo120, Pred taper, Mucinex1200Bid, fluids, & continue the Spiriva & Oxygen; ch her Advair to NEBS w/ Xopenex & Budesonide regularly... ~  10/15: she is much improved w/ Pred taper but she wants off; reminded to use NEBS w/ Xopenex & Budes Bid every day... ~  12/15: she is off the Pred, but also stopped the Xopenex/ Budesonide due to cost; she has gone back on her [place] Albut via NEBS, Mucinex/ Fluids/ etc... ~  [facility] NoGaylyn Che[facility]/ COPD exac> disch on Oxygen, Pred taper, Levaquin750, NEBS w/ Xopenex, Advair500, Spiriva, Mucinex, etc... ~  3/16: post hosp check, very frustrated that she can't get things the way SHE wants them; c/o O2 since she can't breathe thru nose & we reviewed nasal hygiene, huidified O2, ENT re-eval; on Pred taper, off Levaquin now, Advair500Bid, Spiriva daily, NEBS w/ Xopenex Bid, Mucinex (not taking regularly & asked to do 2Bid + fluids); [facility] & recheck pt in 3 weeks... ~  4/16: she continues to be very frustrated w/ her disease & her care- can't breathe thru her nose & she feels the O2 is not doing her any good; we discussed refer to [facility] to see if there is anything they can do.  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 ~  PFT 08/21/14 showing FVC=1.63 (50%), FEV1=0.63 (  25%), %1sec=39;  FEV1 post bronchodil= 0.97 (39%) for a 50%  improvement; DLCO was 22% predicted...  ~  CTChest 08/21/14 showed severe centrilobular emphysema, diffuse bronchial thickening, mild mucus plugging, no adenopathy, no lung nodules etc; also showed mild Ao & coronary calcif, lap band at GE junction, splenosis, DJD Tspine. ~  Summer 2016: She had ENT & PULM evaluations at [facility], continued to see DrStephens (PulmFellow w/ DrHaponik)  ATYPICAL CHEST PAIN (ICD-786.50) - eval by DrWall in 2009. ~  baseline EKG w/ NSR, PVC's, NSSTTWA, NAD... ~  2DEcho 2/05 showed mild MR/ TR, norm LA/ RV, EF=50-55%... ~  NuclearStressTest 4/09 was norm- no ischemia, EF=55%... similiar to prev studies at [place] & 2007... ~  3/15: she notes some atypCP & requests a cardiac referral for eval- prev seen by DrWall, we will call for Cards consult per her request... ~  EKG 3/15 showed NSR, rate91, occas PVCs, otherw wnl... ~  4/15: she had Cards eval DrNishan> he did not feel that invasive testing was warranted; she had 2DEcho but refused Myoview due to co-pay costs...  ~  2DEcho 4/15 showed norm LV sys function w/ EF=60-65%, normal wall motion, Gr1DD, normal valves, normal RV & PA pressures... ~  EKG 2/16 showed NSR, rate88, low voltage, otherw wnl... ~  2DEcho 2/16 showed norm LVF w/ EF=55-60%, Gr1DD, norm valves and norm RV.  VENOUS INSUFFICIENCY, CHRONIC (ICD-459.81) - Hx chr ven insuffic w/ edema... on LASIX 58m- 1-2 daily (she self medicates)... ~  Labs 3/15 showed Cr=1.5 and she is asked to decr the Lasix to 430mQam...  IMPAIRED GLUCOSE TOLERANCE >>  METABOLIC SYNDROME X (ICPJA-250.5- she is on diet alone (refused Metformin therapy)... ~  labs 9/08 showed BS=120... FBS 5/08 was 93, HgA1c=7.1 ~  labs 5/10 showed BS= 182, A1c= 6.4 ~  labs 2/11 showed BS= 76, A1c= 6.5 ~  Labs 5/12 showed BS= 103, A1c= 6.5 ~  She saw DrEllison 4/13 w/ A1c=7.1 but she rejects the diagnosis of Diabetes & refused Metform50080m... ~  10/13:  We discussed IMPAIRED GLUCOSE TOLERANCE &  need for low carb wt reducing diet and incr exercise... 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 ~  max weight ~340# before lap band surg 7/09 by DrMartin. ~  weight 12/09 = 291# ~  weight 5/10 = 265# ~  weight 11/10- = 254# ~  weight 2/11 = 260# ~  Weight 5/12= 290# ~  Weight 11/12 = 286# ~  Weight 4/13 = 280# => BMI=45 ~  Weight 10/13 = 289# ~  Weight 9/14 = 281# ~  Weight 3/15 = 270# ~  Weight 9/15 = 267# ~  Weight 12/15 = 274# ~  Weight 3/16 = 255# => post hosp... ~  Weight 4/16 = 271# ~  Weight 7/16 = 272# ~  Weight 11/16 = 276#  ? Hx HYPOTHYROIDISM (ICD-244.9) - off thyroid meds since 1/10... she was started on Synthroid [place]- note reviewed... she refuses f/u by [name]- we will recheck TSH off Synthroid Rx... ~  labs 5/10 off Synthroid since 1/10 showed TSH= 1.13 ~  labs 2/11 showed TSH= 1.82 ~  Labs 5/12 showed TSH= 1.81 ~  2013: She wanted to restart Thyroid hormone but TSH remains normal> referred to Endocrine for further eval but she was upset w/ DrEllison's eval; she will decide if she wants to pursue another opinion... ~  9/14: not currently on meds but she really wants thyroid Rx; prev TFTs all wnl; she prev had Endocrine in [place] prescribe Synthroid for her but she stopped going; clinically & biochemically euthyroid...  ~  3/15: on Synthroid100-12 tab daily; Labs showed TSH= 3.00 ~  She stopped the Synthroid again... ~  Labs 2/16 not on meds showed TSH=0.39 & FreeT4=1.54    GERD (ICD-530.81) - prev on Nexium but off all meds since 1/10... IRRITABLE BOWEL SYNDROME (ICD-564.1) - colonoscopy 1982 by DrPatterson was WNL... She has refused f/u GI eval & f/u colonoscopy...  ?KIDNEY STONE Hx >> pt said she passed a kidney stone 3/11 w/o any medical attention, w/o work up or proof of same; entry made here for future reference if needed.  UTERINE CANCER, HX OF (ICD-V10.42) - s/p laparoscopic hysterectomy & BSO 7/07 by DrSkinner at [facility]...   DEGENERATIVE JOINT DISEASE (ICD-715.90) - on VICODIN tid prn,  ANAPROX 23106m Bid prn, ULTRAM 520mPrn... ? 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 she was on Wellbutrin & Vicodin when last seen in 2006... VITAMIN D DEFICIENCY (ICD-268.9) - Vit D level 5/10 = 16... rec> start OTC Vit D 2000 u daily. ~  9/14: on Vicodin, Tramadol, Aleve, VitD, etc; prev eval by DrDeveshwar for rheum; now c/o pain in left knee (she thinks it's "siatica"), can't exercise & wants to [facility]- ok... ~  3/15: she states that topical [name] ("horse linament") rubbed into knees really helps but she wants to keep the Vicodinon hand just in case...  Hx of HEADACHE (ICD-784.0)  DYSTHYMIA (ICD-300.4) - off Celexa,  off Zoloft, and on ALPRAZOLAM 0.106m67md as needed... she has [place]ry [place]/ [place]... prev seeing psychologist [name] for eating disorder counselling... ~  4/13: she mentioned seeing [name] again for eating disorder & counseling for depression etc... 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  ~  3/15: she has the Alpraz0.106mg 47m prn use but seldom uses it; still sees [name] on occas but doesn't think she needs to  continue... ~  11/15:  She wants antidepressant med & we discussed Zoloft tria 100mg-71m => 1 tab daily... She still sees Psychologist [name]. ~  3/16:  She is off the Zoloft & states the AlprazSharin Graveo strong (advised cutting the med & try lower dose for the desired effect...  HEALTH MAINTENANCE:   ~  GI:  Per seen by DrPatterson but last colon was 1982 & she refuses follow up... ~  GYN:  [facility]  She had Pneumovax in the past; she declines Flu shots and Tetanus shots...   Past Surgical History  Procedure Laterality Date  . 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 Appendectomy    . Cholecystectomy  1982  . Lapar[place]rectomy  [place]    [facility]  . Bilateral sal[place]ophorectomy  2007    [facility]  . Laparoscopic gastric banding  08/2007    Dr. Hassell Done    Outpatient Encounter Prescriptions as of 01/07/2015  Medication Sig  . albuterol (PROAIR HFA) 108 (90 BASE) MCG/ACT inhaler Inhale 2 puffs into the lungs every 6 (six) hours as needed.  Marland Kitchen albuterol (PROVENTIL HFA;VENTOLIN HFA) 108 (90 BASE) MCG/ACT inhaler Inhale 2 puffs into the lungs every 6 (six) hours as needed for wheezing or shortness of breath.  . allopurinol (ZYLOPRIM) 100 MG tablet Take 1 tablet (100 mg total) by mouth daily.  Marland Kitchen ALPRAZolam (XANAX) 0.5 MG tablet TAKE 1/2 TO 1 TABLET BY MOUTH 3 TIMES DAILY AS NEEDED FOR NERVES. NOT TO EXCEED 3 PER DAY  . B Complex-C-Folic Acid (B-COMPLEX BALANCED) TABS Take 1 tablet by mouth daily.  . budesonide-formoterol (SYMBICORT) 160-4.5 MCG/ACT inhaler Inhale 2 puffs into the lungs 2 (two) times daily.  . cetirizine (ZYRTEC) 10 MG tablet Take 10 mg by mouth daily.    . Cholecalciferol (VITAMIN D-3) 5000 UNITS TABS Take 1 tablet by mouth daily.  . Coconut Oil 1000 MG CAPS Take 4 capsules by mouth daily.  . cyclobenzaprine (FLEXERIL) 10 MG tablet Take 1 tablet (10 mg total) by mouth 3 (three) times daily as needed for muscle  spasms.  . furosemide (LASIX) 40 MG tablet TAKE 1-2 TABLETS ONCE A DAY AS NEEDED FOR SWELLING  . levalbuterol (XOPENEX) 1.25 MG/3ML nebulizer solution Take 1.25 mg by nebulization 3 (three) times daily.  . Magnesium 400 MG CAPS Take 2 capsules by mouth daily.  . methylPREDNISolone (MEDROL) 2 MG tablet Take 2 tablets (4 mg total) by mouth daily. Take as directed  . naproxen sodium (ANAPROX) 220 MG tablet Take 2 tabs by mouth once daily   . tiotropium (SPIRIVA HANDIHALER) 18 MCG inhalation capsule PLACE 1 CAPSULE (18 MCG TOTAL) INTO INHALER AND INHALE DAILY.  . traMADol (ULTRAM) 50 MG tablet TAKE 1 TABLET BY MOUTH  3 TIMES DAILY   AS NEEDED FOR PAIN  . vitamin C (ASCORBIC ACID) 250 MG tablet Take 250 mg by mouth daily.  Raelyn Ensign Pollen 1000 MG TABS Take 2 tablets by mouth daily.  . Coenzyme Q10 (COQ10) 100 MG CAPS Take 1 tablet by mouth daily.  . Colchicine 0.6 MG CAPS TAKE 1 TABLET TWICE DAILY FOR 5 DAYS (Patient not taking: Reported on 01/07/2015)  . Krill Oil 500 MG CAPS Take 1 capsule by mouth daily.    Allergies  Allergen Reactions  . Penicillins     REACTION: nausea and vomiting    Current Medications, Allergies, Past Medical History, Past Surgical History, Family History, and Social History were reviewed in [employer] record.   Review of Systems:         See HPI - all other systems neg except as noted... The patient complains of dyspnea on exertion and difficulty walking; chest discomfort, & leg weakness.  The patient denies anorexia, fever, weight loss, weight gain, vision loss, decreased hearing, hoarseness, syncope, prolonged cough, headaches, hemoptysis, abdominal pain, melena, hematochezia, severe indigestion/heartburn, hematuria, incontinence, suspicious skin lesions, transient blindness, depression, unusual weight change, abnormal bleeding, enlarged lymph nodes, and angioedema.    Objective:   Physical Exam:    WD, Morbidly Obese, 64 y/o WF in no acute  distress but c/o dyspnea/  wheezing... GENERAL:  Alert & oriented; pleasant & cooperative... HEENT:  [place]/AT, EOM-wnl, PERRLA, EACs-clear, TMs-wnl, NOSE-clear, THROAT-clear & wnl. NECK:  Supple w/ fairROM; no JVD; normal carotid impulses w/o bruits; no thyromegaly or nodules palpated; no lymphadenopathy. CHEST:  no accessory musc use, decr BS bilat, no wheezing/ rales/ rhonchi... HEART:  Regular Rhythm; without murmurs/ rubs/ or gallops heard... ABDOMEN:  Obese, soft & nontender; normal bowel sounds; no organomegaly or masses detected. EXT: without deformities, mod arthritic changes; no varicose veins/ +venous insuffic/trace edema. NEURO:  CNs intact; no focal neuro deficits... DERM: few ecchymoses, no rash etc...  RADIOLOGY DATA:  Reviewed in the EPIC EMR & discussed w/ the patient...  LABORATORY DATA:  Reviewed in the EPIC EMR & discussed w/ the patient...   Assessment & Plan:    ENT>> she was working w/ DrPlonk at [employer]; encouraged to return for further eval...   OSA>  As prev noted she needs new sleep study & appropriate new CPAP apparatus to facilitate compliance but she declines the repeat study or sleep med referral...  COPD/ Emphysema>  Acute exac 2/16 improved after hosp in [place] w/ Levaquin, Pred taper, NEBS Bid, Avair500, Spiriva, Mucinex etc... Encouraged to stay on her O2 regularly & we will add Humidity, needs max nasal hygiene vs ENT follow up; continue NEBS/ Advair500/ Spiriva regularly & use the Mucinex; we will request the [facility] home Care home COPD program; needs to lose wt & incr exercise... 4/16>  Spirometry w/ severe airflow obstruction and GOLD Stage 3-4 COPD; optimize meds and refer to [facility]- ENT & Pulm divisions to see if there is anything that they can do to help... 5/16>  She has seen ENT & Pulm 2nd opinion is pending; she cannot manipulate the port O2 tanks and we will request an [employer]EN One- port O2 concentrator for her use...  5/16>  States breathing worse, asked  to use all regular meds regularly & we will add Depo120, Medrol taper... 6/16>  She had 2nd opinion consult w/ DrStephens/ DrHaponik at [facility] see above... 10/16>  Pt very frustrated after working w/ the [employer] division at [employer] for several months- Nebs, Advair500, Spiriva, Medrol, Oxygen, pulm rehab, etc...  11/16>  On Medrol12m- she incr herself to 624md, Symbicort160-2spBid, Spiriva daily, Xopenex NEBS Tid (only doing it once/d), Guaifenesin400- taking 6/d w/ fluids, O2- she is not using it at rest, using 5L/min w/ exercise; she takes Lasix40, Tamadol50, & Xanax0.5 as needed...  Hx CP w/ neg cardiac evals including prev Myoviews; 2DEchos w/o incr PA pressures... 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 [facility]16> [facility] and norm RV...  OBESITY>  She had remote lap band surg by DrMMartin; Met syndrome, must get wt down etc; she is a lap band failure & prev counseling by [name]... 3/16> weight down to 255# post hosp... 4/16> weight is back up to 271# despite all efforts...  ? Hx Hypothy>  TFTs remain normal off meds- no problem...  GI>  GERD> she denies symptoms & uses OTC meds prn...  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 C/O right ankle pain, eval by DrZSmith> no better on his anti-inflamm rx so we will Rx w/ Depo120 & Medrol taper...  Dysthymia>  Much stress in her life, on Alpraz prn; has embraced alt lifestyle...   Patient's  Medications  New Prescriptions   No medications on file  Previous Medications   ALBUTEROL (PROAIR HFA) 108 (90 BASE) MCG/ACT INHALER    Inhale 2 puffs into the lungs every 6 (six) hours as needed.   ALBUTEROL (PROVENTIL HFA;VENTOLIN HFA) 108 (90 BASE) MCG/ACT INHALER    Inhale 2 puffs into the lungs every 6 (six) hours as needed for wheezing or shortness of breath.   ALLOPURINOL  (ZYLOPRIM) 100 MG TABLET    Take 1 tablet (100 mg total) by mouth daily.   ALPRAZOLAM (XANAX) 0.5 MG TABLET    TAKE 1/2 TO 1 TABLET BY MOUTH 3 TIMES DAILY AS NEEDED FOR NERVES. NOT TO EXCEED 3 PER DAY   B COMPLEX-C-FOLIC ACID (B-COMPLEX BALANCED) TABS    Take 1 tablet by mouth daily.   BEE POLLEN 1000 MG TABS    Take 2 tablets by mouth daily.   BUDESONIDE-FORMOTEROL (SYMBICORT) 160-4.5 MCG/ACT INHALER    Inhale 2 puffs into the lungs 2 (two) times daily.   CETIRIZINE (ZYRTEC) 10 MG TABLET    Take 10 mg by mouth daily.     CHOLECALCIFEROL (VITAMIN D-3) 5000 UNITS TABS    Take 1 tablet by mouth daily.   COCONUT OIL 1000 MG CAPS    Take 4 capsules by mouth daily.   COENZYME Q10 (COQ10) 100 MG CAPS    Take 1 tablet by mouth daily.   COLCHICINE 0.6 MG CAPS    TAKE 1 TABLET TWICE DAILY FOR 5 DAYS   CYCLOBENZAPRINE (FLEXERIL) 10 MG TABLET    Take 1 tablet (10 mg total) by mouth 3 (three) times daily as needed for muscle spasms.   FUROSEMIDE (LASIX) 40 MG TABLET    TAKE 1-2 TABLETS ONCE A DAY AS NEEDED FOR SWELLING   KRILL OIL 500 MG CAPS    Take 1 capsule by mouth daily.   LEVALBUTEROL (XOPENEX) 1.25 MG/3ML NEBULIZER SOLUTION    Take 1.25 mg by nebulization 3 (three) times daily.   MAGNESIUM 400 MG CAPS    Take 2 capsules by mouth daily.   METHYLPREDNISOLONE (MEDROL) 2 MG TABLET    Take 2 tablets (4 mg total) by mouth daily. Take as directed   NAPROXEN SODIUM (ANAPROX) 220 MG TABLET    Take 2 tabs by mouth once daily    PREDNISONE (DELTASONE) 50 MG TABLET    Take 1 tablet (50 mg total) by mouth daily.   TIOTROPIUM (SPIRIVA HANDIHALER) 18 MCG INHALATION CAPSULE    PLACE 1 CAPSULE (18 MCG TOTAL) INTO INHALER AND INHALE DAILY.   TRAMADOL (ULTRAM) 50 MG TABLET    TAKE 1 TABLET BY MOUTH  3 TIMES DAILY   AS NEEDED FOR PAIN   VITAMIN C (ASCORBIC ACID) 250 MG TABLET    Take 250 mg by mouth daily.  Modified Medications   No medications on file  Discontinued Medications   No medications on file

## 2015-01-07 NOTE — Patient Instructions (Signed)
Today we updated your med list in our EPIC system...    Continue your current medications the same...  We reviewed your meds & recommended regimen>     Medrol4mg - you are taking 6mg /d & rec to wean back to 4mg /d if able...    Symbicort160-2spBid regularly...    Spiriva via handihaler daily...    Xopenex NEBS- you are only doing this once daily & rec to increase to 3 times per day...    Guaifenesin400- taking 6/d w/ fluids, continue same...    O2- you are not using it at rest, using 5L/min w/ exercise...    On Lasix40, Tamadol50, & Xanax0.5 as needed...   Work on weight reduction as this will help your breathing...  Let's plan a follow up visit in 80mo, sooner if needed for problems.Marland KitchenMarland Kitchen

## 2015-01-09 ENCOUNTER — Encounter: Payer: Self-pay | Admitting: Family Medicine

## 2015-01-09 ENCOUNTER — Other Ambulatory Visit (INDEPENDENT_AMBULATORY_CARE_PROVIDER_SITE_OTHER): Payer: Medicare Other

## 2015-01-09 ENCOUNTER — Ambulatory Visit (INDEPENDENT_AMBULATORY_CARE_PROVIDER_SITE_OTHER)
Admission: RE | Admit: 2015-01-09 | Discharge: 2015-01-09 | Disposition: A | Discharge status: Home / Self Care | Payer: Medicare Other | Source: Ambulatory Visit | Attending: Family Medicine | Admitting: Family Medicine

## 2015-01-09 ENCOUNTER — Ambulatory Visit (INDEPENDENT_AMBULATORY_CARE_PROVIDER_SITE_OTHER): Payer: Medicare Other | Admitting: Family Medicine

## 2015-01-09 VITALS — BP 124/72 | HR 90 | Wt 279.0 lb

## 2015-01-09 DIAGNOSIS — M545 Low back pain, unspecified: Secondary | ICD-10-CM

## 2015-01-09 DIAGNOSIS — G4762 Sleep related leg cramps: Secondary | ICD-10-CM

## 2015-01-09 DIAGNOSIS — R252 Cramp and spasm: Secondary | ICD-10-CM | POA: Diagnosis not present

## 2015-01-09 DIAGNOSIS — M5136 Other intervertebral disc degeneration, lumbar region: Secondary | ICD-10-CM | POA: Diagnosis not present

## 2015-01-09 LAB — COMPREHENSIVE METABOLIC PANEL
ALBUMIN: 4 g/dL (ref 3.5–5.2)
ALT: 19 U/L (ref 0–35)
AST: 19 U/L (ref 0–37)
Alkaline Phosphatase: 66 U/L (ref 39–117)
BUN: 35 mg/dL — ABNORMAL HIGH (ref 6–23)
CALCIUM: 9.6 mg/dL (ref 8.4–10.5)
CHLORIDE: 105 meq/L (ref 96–112)
CO2: 29 meq/L (ref 19–32)
CREATININE: 1.52 mg/dL — AB (ref 0.40–1.20)
GFR: 36.33 mL/min — AB (ref >60.00)
Glucose, Bld: 114 mg/dL — ABNORMAL HIGH (ref 70–99)
POTASSIUM: 4.3 meq/L (ref 3.5–5.1)
Sodium: 145 mEq/L (ref 135–145)
Total Bilirubin: 0.4 mg/dL (ref 0.2–1.2)
Total Protein: 6.7 g/dL (ref 6.0–8.3)

## 2015-01-09 LAB — CBC WITH DIFFERENTIAL/PLATELET
BASOS PCT: 0.5 % (ref 0.0–3.0)
Basophils Absolute: 0.1 10*3/uL (ref 0.0–0.1)
EOS PCT: 2 % (ref 0.0–5.0)
Eosinophils Absolute: 0.3 10*3/uL (ref 0.0–0.7)
HEMATOCRIT: 46 % (ref 36.0–46.0)
HEMOGLOBIN: 15.1 g/dL — AB (ref 12.0–15.0)
LYMPHS PCT: 12.3 % (ref 12.0–46.0)
Lymphs Abs: 2.1 10*3/uL (ref 0.7–4.0)
MCHC: 32.7 g/dL (ref 30.0–36.0)
MCV: 94 fl (ref 78.0–100.0)
MONO ABS: 1 10*3/uL (ref 0.1–1.0)
MONOS PCT: 6 % (ref 3.0–12.0)
Neutro Abs: 13.5 10*3/uL — ABNORMAL HIGH (ref 1.4–7.7)
Neutrophils Relative %: 79.2 % — ABNORMAL HIGH (ref 43.0–77.0)
Platelets: 331 10*3/uL (ref 150.0–400.0)
RBC: 4.9 Mil/uL (ref 3.87–5.11)
RDW: 14.5 % (ref 11.5–15.5)
WBC: 17 10*3/uL — AB (ref 4.0–10.5)

## 2015-01-09 LAB — IBC PANEL
IRON: 78 ug/dL (ref 42–145)
SATURATION RATIOS: 22.1 % (ref 20.0–50.0)
TRANSFERRIN: 252 mg/dL (ref 212.0–360.0)

## 2015-01-09 LAB — VITAMIN B12: Vitamin B-12: 864 pg/mL (ref 211–911)

## 2015-01-09 LAB — T4, FREE: FREE T4: 1.05 ng/dL (ref 0.60–1.60)

## 2015-01-09 LAB — TSH: TSH: 1.17 u[IU]/mL (ref 0.35–4.50)

## 2015-01-09 NOTE — Assessment & Plan Note (Signed)
She was having significant leg cramping. I do believe that this is likely secondary to either metabolic abnormality versus poor oxygen saturation. We discussed the possibility of a sleep study which patient declined today. We will get back x-rays to rule out any significant bony normality that can be contribute in. It was more anatomical New think it would happen during the day as well as at night. Patient has not had any response to a muscle relaxer previously. We discussed the possibility of increasing some of her vitamin supplementations but we would like to wait until patient's labs come back. If all labs are normal did suggest that it sleep study would be beneficial with the possibility for an before meals pad machine. Patient states that she did have one approximately 15 years ago. Depending on results this will tell us about our evaluation and management.  Spent  25 minutes with patient face-to-face and had greater than 50% of counseling including as described above in assessment and plan.

## 2015-01-09 NOTE — Patient Instructions (Signed)
Good to see you Stay strong! Try icing before bed and see if that helps Lets get labs and xray today of the back and see what is going on.  I will release the results and tell you what to change.  See me again in 2-3 weeks and lets make sure we are making improvement.

## 2015-01-09 NOTE — Progress Notes (Signed)
[name] [address], [place] [address] Phone: [phone] Subjective:    I'm seeing this patient by the request  of:  NADEL,SCOTT M, MD   CC: Back pain  MGQ:[id number] Rebecca [name] is a 64 y.o. female coming in with complaint of back pain. Patient states that the back pain has been a long-standing process. Patient did injure herself when she was 64 years of age. In addition of this patient has had x-rays previously in 2004. Patient also had MRI in 2060 was reviewed by me today. Patient does have significant L4-L5 and L5-S1 facet arthritis. Patient states it seems to be stable with her back but the most concerning problem is she is having cramping in her legs bilaterally. Seems to be only at night. When she rolls onto her right side it seems to be mostly the left leg but can be both legs. Denies any weakness. States that when it occurs she is awake for multiple hours. Has not notice any association with any activities food. Patient rates the severity of the cramping though as 9 out of 10. Patient is wearing 4 L of oxygen at night. Worse only a nasal cannula. Denies any fever, chills, or any abnormal weight loss recently.  Past Medical History  Diagnosis Date  . OSA (obstructive sleep apnea)   . COPD (chronic obstructive pulmonary disease) ([place])   . Chest pain   . Chronic venous insufficiency   . Metabolic syndrome X   . Morbid obesity ([place])   . Hypothyroid   . GERD (gastroesophageal reflux disease)   . IBS (irritable bowel syndrome)   . DJD (degenerative joint disease)   . Fibromyalgia   . Vitamin D deficiency   . History of headache   . Dysthymia   . 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 Appendectomy    . Cholecystectomy  1982  . Laparoscopic hysterectomy  2007    [facility]  . Bilateral salpingoophorectomy  2007   [facility]  . Laparoscopic gastric banding  08/2007    Dr. Hassell Done   Social History  Substance Use Topics  . Smoking status: Former Smoker -- 1.00 packs/day    Types: Cigarettes    Quit date: 03/03/1991  . Smokeless tobacco: Never Used  . Alcohol Use: No   Allergies  Allergen Reactions  . Penicillins     REACTION: nausea and vomiting   Family History  Problem Relation Age of Onset  . Cancer Mother   . Heart failure Father   . Heart attack Sister         Past medical history, social, surgical and family history all reviewed in electronic medical record.   Review of Systems: No headache, visual changes, nausea, vomiting, diarrhea, constipation, dizziness, abdominal pain, skin rash, fevers, chills, night sweats, weight loss, swollen lymph nodes, body aches, joint swelling, muscle aches, chest pain, shortness of breath, mood changes.   Objective Blood pressure 124/72, pulse 90, weight 279 lb (126.554 kg), SpO2 93 %.  General: No apparent distress alert and oriented x3 mood and affect normal, dressed appropriately. Severely obese HEENT: Pupils equal, extraocular movements intact  Respiratory: Patient's speak in full sentences and does not appear short of breath  Cardiovascular: No lower extremity edema, non tender, no erythema  Skin: Warm dry intact with no signs of infection  or rash on extremities or on axial skeleton.  Abdomen: Soft nontender  Neuro: Cranial nerves II through XII are intact, neurovascularly intact in all extremities with 2+ DTRs and 2+ pulses.  Lymph: No lymphadenopathy of posterior or anterior cervical chain or axillae bilaterally.  Gait normal with good balance and coordination.  MSK:  Non tender with full range of motion and good stability and symmetric strength and tone of shoulders, elbows, wrist, hip, knee and ankles bilaterally.  Back Exam:  Inspection: Unremarkable  Motion: Flexion 25 deg, Extension 15 deg, Side Bending to 25 deg bilaterally,  Rotation  to 25 deg bilaterally  SLR laying: Negative  XSLR laying: Negative  Palpable tenderness: Tender to palpation of the first but a musculature of the lumbar spine bilaterally.Process tenderness.. FABER: negative. Sensory change: Gross sensation intact to all lumbar and sacral dermatomes.  Reflexes: 2+ at both patellar tendons, 2+ at achilles tendons, Babinski's downgoing.  Strength at foot  Plantar-flexion: 5/5 Dorsi-flexion: 5/5 Eversion: 5/5 Inversion: 5/5  Leg strength  Quad: 5/5 Hamstring: 5/5 Hip flexor: 5/5 Hip abductors: 4/5  Gait unremarkable. Neurovascular intact   Impression and Recommendations:     This case required medical decision making of moderate complexity.

## 2015-01-09 NOTE — Progress Notes (Signed)
Pre visit review using our clinic review tool, if applicable. No additional management support is needed unless otherwise documented below in the visit note. 

## 2015-01-23 ENCOUNTER — Other Ambulatory Visit: Payer: Self-pay | Admitting: Pulmonary Disease

## 2015-01-28 ENCOUNTER — Other Ambulatory Visit: Payer: Self-pay | Admitting: Pulmonary Disease

## 2015-01-29 NOTE — Telephone Encounter (Signed)
Per SN>> Ok to refill as previously with four additional refills  Refill called into pt's pharmacy  Nothing further is needed at this time.

## 2015-01-29 NOTE — Telephone Encounter (Signed)
Pt requesting refill on alprazolam Last refilled 06/06/14 TAKE 1/2 TO 1 TABLET BY MOUTH 3 TIMES DAILY AS NEEDED FOR NERVES. NOT TO EXCEED 3 PER DAY #90 x  5 refills Please advise Dr. Lenna Gilford thanks

## 2015-02-04 ENCOUNTER — Telehealth: Payer: Self-pay | Admitting: Pulmonary Disease

## 2015-02-04 DIAGNOSIS — J449 Chronic obstructive pulmonary disease, unspecified: Secondary | ICD-10-CM

## 2015-02-04 MED ORDER — METHYLPREDNISOLONE 4 MG PO TABS: ORAL_TABLET | ORAL | Status: DC

## 2015-02-04 NOTE — Telephone Encounter (Signed)
Called spoke with pt. She needs refill on medrol refill (I have sent this in). Pt is wanting an order sent to [facility] to get an eval for a more portable O2 or possible liquid O2? She reports she is filling more of her tanks more often and it takes about 3.5 hrs to fill these tanks she has per pt. Please advise Dr. Lenna Gilford thanks

## 2015-02-05 NOTE — Telephone Encounter (Signed)
Per SN>> Place order through [facility] for evaluation for different POC   Order placed  Pt notified of order being placed  Nothing further is needed at this time.

## 2015-02-06 ENCOUNTER — Telehealth: Payer: Self-pay | Admitting: Pulmonary Disease

## 2015-02-06 NOTE — Telephone Encounter (Signed)
Per Dawn PCC>> [facility] contacted office and stated that they though pt was with [facility] now not [facility]. [facility] wants to know if pt wishes to switch back to them for new POC.  Left message for pt to call back to discuss

## 2015-02-07 NOTE — Telephone Encounter (Signed)
atc pt X2 on cell phone, went to fast busy signal.  Wcb.

## 2015-02-07 NOTE — Telephone Encounter (Signed)
Patient called back and will be leaving and can reach her on her cell [phone].

## 2015-02-07 NOTE — Telephone Encounter (Signed)
lmtcb x1 for pt. 

## 2015-02-07 NOTE — Telephone Encounter (Signed)
[phone], pt cb

## 2015-02-11 DIAGNOSIS — R0600 Dyspnea, unspecified: Secondary | ICD-10-CM | POA: Diagnosis not present

## 2015-02-11 NOTE — Telephone Encounter (Signed)
Patient is still using [facility], not [facility].  She wanted to use [facility], but they told her that she was locked in with [facility] until 07/2016 due to Medicare coverage.    Patient wants to be evaluated for POC... Possibly liquid oxygen. Patient would also like to have a mask vs Cannula.    Patient says that it takes 3 hours to fill one of her tanks, but they only last 1 1/2 hours.     She has 25 feet of Tubing for the concentrator and it is getting water in the tubing, it is causing water to drip out of her nose at night and she is taking off her oxygen at night and sleeping without it for this reason.    Cardiologist advised patient to wear O2 all the time because it is putting stress on her heart.  Sitting on RA, without moving, her O2 stays at 92%; she can walk to the kitchen on 5L but her oxygen level will still drop into the 70's. The oxygen helps her to recover quicker.  She would like to see if she can get some more E Tanks and would also like to have a mask instead of nasal cannula.   Patient says that [facility] will not give her a mask vs cannula.  She said that they will not give her E Tanks.  She said that they told her that she was not "following instructions" so she couldn't have these.  She is not sure what "following instructions" means.     Patient says that she would like to ride her horses again, but the current o2 tanks that she is using scare her horses.  She wants one that she can carry in a backpack on her back so she can ride without scaring the horses.    To [name], Dr. Lenna Gilford, [facility]

## 2015-02-12 ENCOUNTER — Other Ambulatory Visit: Payer: Self-pay | Admitting: Pulmonary Disease

## 2015-02-12 DIAGNOSIS — J449 Chronic obstructive pulmonary disease, unspecified: Secondary | ICD-10-CM

## 2015-02-12 NOTE — Telephone Encounter (Signed)
Spoke to Rebecca [name]  about this they are going to see what kind of poc she might can use, the problem is she is on 3lpm rest   And 5lpm with exertion not may poc's can be used at that level but they are checking on this SN talked to them about it the morning too Rebecca [name]

## 2015-02-12 NOTE — Telephone Encounter (Signed)
Order placed for pt to receive OxyMask though [facility] and have RT come to home for portability assessment  Nothing further is needed

## 2015-02-22 ENCOUNTER — Telehealth: Payer: Self-pay | Admitting: Pulmonary Disease

## 2015-02-22 MED ORDER — ETODOLAC ER 400 MG PO TB24: 400.0000 mg | ORAL_TABLET | Freq: Two times a day (BID) | ORAL | Status: DC | PRN

## 2015-02-22 MED ORDER — COLCHICINE 0.6 MG PO TABS: 0.6000 mg | ORAL_TABLET | Freq: Every day | ORAL | Status: DC

## 2015-02-22 NOTE — Telephone Encounter (Signed)
Per SN: If it is Gout:  1) the Prednisone should help; 2) Add Colchicine 0.6mg , 1 daily #30; 3) Add Etodolac 400mg  1 PO BID PRN pain #60 Rx sent to pharmacy Patient notified of SN's recommendations and that medication has been sent to pharmacy.   etodolac (LODINE XL) 400 MG 24 hr tablet 60 tablet 0 02/22/2015      Sig - Route: Take 1 tablet (400 mg total) by mouth 2 (two) times daily as needed. - Oral     E-Prescribing Status: Receipt confirmed by pharmacy (02/22/2015 2:41 PM EST)     colchicine 0.6 MG tablet 30 tablet 0 02/22/2015       Sig - Route: Take 1 tablet (0.6 mg total) by mouth daily. - Oral     E-Prescribing Status: Receipt confirmed by pharmacy (02/22/2015 2:41 PM EST)

## 2015-02-22 NOTE — Telephone Encounter (Signed)
Patient says that her achilles tendon on her Right foot hurts.  She is worried that she may have Gout forming. She said she takes Vitamin D complex with Niacin and it can cause Gout according to what she read on the internet.  She says that she takes 2 Aleve every day..Patient is still taking Prednisone 4mg  daily, stopped the 6mg , only taking 4mg  x 1 week.  Also taking 80mg  of Lasix daily. She would like something to help with the pain.   Pharmacy: [facility]  Current Outpatient Prescriptions on File Prior to Visit  Medication Sig Dispense Refill  . albuterol (PROAIR HFA) 108 (90 BASE) MCG/ACT inhaler Inhale 2 puffs into the lungs every 6 (six) hours as needed. 1 Inhaler 6  . albuterol (PROVENTIL HFA;VENTOLIN HFA) 108 (90 BASE) MCG/ACT inhaler Inhale 2 puffs into the lungs every 6 (six) hours as needed for wheezing or shortness of breath. 1 Inhaler 6  . allopurinol (ZYLOPRIM) 100 MG tablet Take 1 tablet (100 mg total) by mouth daily. 30 tablet 6  . ALPRAZolam (XANAX) 0.5 MG tablet TAKE 1/2 TO 1 TABLET(S) BY MOUTH THREE TIMES DAILY AS NEEDED FOR NERVES. NOT TO EXCEED 3 TABS/DAY. 90 tablet 4  . B Complex-C-Folic Acid (B-COMPLEX BALANCED) TABS Take 1 tablet by mouth daily.    . budesonide-formoterol (SYMBICORT) 160-4.5 MCG/ACT inhaler Inhale 2 puffs into the lungs 2 (two) times daily. 1 Inhaler 6  . cetirizine (ZYRTEC) 10 MG tablet Take 10 mg by mouth daily.      . Cholecalciferol (VITAMIN D-3) 5000 UNITS TABS Take 1 tablet by mouth daily.    . Coconut Oil 1000 MG CAPS Take 4 capsules by mouth daily.    . cyclobenzaprine (FLEXERIL) 10 MG tablet Take 1 tablet (10 mg total) by mouth 3 (three) times daily as needed for muscle spasms. 90 tablet 5  . furosemide (LASIX) 40 MG tablet TAKE 1-2 TABLETS ONCE A DAY AS NEEDED FOR SWELLING 60 tablet 1  . furosemide (LASIX) 40 MG tablet TAKE 1-2 TABLETS ONCE A DAY AS NEEDED FOR SWELLING 60 tablet 1  . levalbuterol (XOPENEX) 1.25 MG/3ML nebulizer  solution Take 1.25 mg by nebulization 3 (three) times daily. 270 mL 6  . Magnesium 400 MG CAPS Take 2 capsules by mouth daily.    . methylPREDNISolone (MEDROL) 4 MG tablet Take 6 mg by mouth daily.    . methylPREDNISolone (MEDROL) 4 MG tablet TAKE 1 TABLET (4 MG TOTAL) BY MOUTH DAILY. TAKE AS DIRECTED 30 tablet 0  . naproxen sodium (ANAPROX) 220 MG tablet Take 2 tabs by mouth once daily     . tiotropium (SPIRIVA HANDIHALER) 18 MCG inhalation capsule PLACE 1 CAPSULE (18 MCG TOTAL) INTO INHALER AND INHALE DAILY. 20 capsule 0  . traMADol (ULTRAM) 50 MG tablet TAKE 1 TABLET BY MOUTH  3 TIMES DAILY   AS NEEDED FOR PAIN 90 tablet 1  . vitamin C (ASCORBIC ACID) 250 MG tablet Take 250 mg by mouth daily.     No current facility-administered medications on file prior to visit.   Allergies  Allergen Reactions  . Penicillins     REACTION: nausea and vomiting

## 2015-03-02 ENCOUNTER — Other Ambulatory Visit: Payer: Self-pay | Admitting: Pulmonary Disease

## 2015-03-05 ENCOUNTER — Other Ambulatory Visit: Payer: Self-pay

## 2015-03-14 ENCOUNTER — Telehealth: Payer: Self-pay | Admitting: Pulmonary Disease

## 2015-03-14 NOTE — Telephone Encounter (Signed)
Ok to call and leave a msg

## 2015-03-14 NOTE — Telephone Encounter (Signed)
Per SN: I think she is on maximum amount of meds.  Ok to relax chest wall she can increase Alprazolam 0.5mg  to 1 tablet po qid .  Continue with Symbicort, Spiriva, Nebulizer and Oxygen.  If breathing worsens she needs to go to ER.

## 2015-03-14 NOTE — Telephone Encounter (Signed)
LMTC x 1  

## 2015-03-14 NOTE — Telephone Encounter (Signed)
Called spoke with pt and is aware of recs below. She verbalized understanding and needed nothing further 

## 2015-03-14 NOTE — Telephone Encounter (Signed)
Called spoke with pt. She reports she is having hard time breathing to do anything. She has turned her O2 up to 8 liters just to get water per pt. She has been taking medrol 4 mg daily x few weeks now. She does not want to increase this bc it is causing her to gain weight. Pt using cool mist vaporizer.  Pt denies any chest tx, no wheezing. She reports resting her O2 level is in the 90's and if she gets up to move she reports she drops into the 80's on O2. Pt wants to know what else SN recommends. Please advise thanks  Allergies  Allergen Reactions  . Penicillins     REACTION: nausea and vomiting     Current Outpatient Prescriptions on File Prior to Visit  Medication Sig Dispense Refill  . albuterol (PROVENTIL HFA;VENTOLIN HFA) 108 (90 BASE) MCG/ACT inhaler Inhale 2 puffs into the lungs every 6 (six) hours as needed for wheezing or shortness of breath. 1 Inhaler 6  . allopurinol (ZYLOPRIM) 100 MG tablet Take 1 tablet (100 mg total) by mouth daily. 30 tablet 6  . ALPRAZolam (XANAX) 0.5 MG tablet TAKE 1/2 TO 1 TABLET(S) BY MOUTH THREE TIMES DAILY AS NEEDED FOR NERVES. NOT TO EXCEED 3 TABS/DAY. 90 tablet 4  . B Complex-C-Folic Acid (B-COMPLEX BALANCED) TABS Take 1 tablet by mouth daily.    . budesonide-formoterol (SYMBICORT) 160-4.5 MCG/ACT inhaler Inhale 2 puffs into the lungs 2 (two) times daily. 1 Inhaler 6  . cetirizine (ZYRTEC) 10 MG tablet Take 10 mg by mouth daily.      . Cholecalciferol (VITAMIN D-3) 5000 UNITS TABS Take 1 tablet by mouth daily.    . Coconut Oil 1000 MG CAPS Take 4 capsules by mouth daily.    . colchicine 0.6 MG tablet Take 1 tablet (0.6 mg total) by mouth daily. 30 tablet 0  . cyclobenzaprine (FLEXERIL) 10 MG tablet Take 1 tablet (10 mg total) by mouth 3 (three) times daily as needed for muscle spasms. 90 tablet 5  . etodolac (LODINE XL) 400 MG 24 hr tablet Take 1 tablet (400 mg total) by mouth 2 (two) times daily as needed. 60 tablet 0  . furosemide (LASIX) 40 MG tablet  TAKE 1-2 TABLETS ONCE A DAY AS NEEDED FOR SWELLING 60 tablet 1  . levalbuterol (XOPENEX) 1.25 MG/3ML nebulizer solution Take 1.25 mg by nebulization 3 (three) times daily. 270 mL 6  . Magnesium 400 MG CAPS Take 2 capsules by mouth daily.    . methylPREDNISolone (MEDROL) 4 MG tablet TAKE 1 TABLET (4 MG TOTAL) BY MOUTH DAILY. TAKE AS DIRECTED 30 tablet 1  . naproxen sodium (ANAPROX) 220 MG tablet Take 2 tabs by mouth once daily     . tiotropium (SPIRIVA HANDIHALER) 18 MCG inhalation capsule PLACE 1 CAPSULE (18 MCG TOTAL) INTO INHALER AND INHALE DAILY. 20 capsule 0  . traMADol (ULTRAM) 50 MG tablet TAKE 1 TABLET BY MOUTH  3 TIMES DAILY   AS NEEDED FOR PAIN 90 tablet 1  . vitamin C (ASCORBIC ACID) 250 MG tablet Take 250 mg by mouth daily.     No current facility-administered medications on file prior to visit.

## 2015-03-14 NOTE — Telephone Encounter (Signed)
Patient returned call, CB is [phone]

## 2015-03-20 ENCOUNTER — Other Ambulatory Visit: Payer: Self-pay | Admitting: Pulmonary Disease

## 2015-03-24 ENCOUNTER — Telehealth: Payer: Self-pay | Admitting: Pulmonary Disease

## 2015-03-28 NOTE — Telephone Encounter (Signed)
Received refill request for spiriva and zoloft. zoloft is no longer on pt medication list. Zoloft was removed 05/02/14 as not taking. Please advise SN if okay to refill? thanks

## 2015-03-28 NOTE — Telephone Encounter (Signed)
Per SN-  Ok to refill both Spiriva and Zoloft. Thanks

## 2015-03-30 ENCOUNTER — Other Ambulatory Visit: Payer: Self-pay | Admitting: Pulmonary Disease

## 2015-04-03 ENCOUNTER — Other Ambulatory Visit: Payer: Self-pay | Admitting: Emergency Medicine

## 2015-04-03 MED ORDER — LEVALBUTEROL HCL 1.25 MG/3ML IN NEBU: 1.2500 mg | INHALATION_SOLUTION | Freq: Three times a day (TID) | RESPIRATORY_TRACT | Status: DC

## 2015-04-09 ENCOUNTER — Ambulatory Visit (INDEPENDENT_AMBULATORY_CARE_PROVIDER_SITE_OTHER): Payer: Medicare Other | Admitting: Pulmonary Disease

## 2015-04-09 ENCOUNTER — Encounter: Payer: Self-pay | Admitting: Pulmonary Disease

## 2015-04-09 VITALS — BP 142/90 | HR 99 | Temp 97.3°F | Ht 65.0 in | Wt 280.0 lb

## 2015-04-09 DIAGNOSIS — J449 Chronic obstructive pulmonary disease, unspecified: Secondary | ICD-10-CM

## 2015-04-09 DIAGNOSIS — J9611 Chronic respiratory failure with hypoxia: Secondary | ICD-10-CM

## 2015-04-09 DIAGNOSIS — M150 Primary generalized (osteo)arthritis: Secondary | ICD-10-CM | POA: Diagnosis not present

## 2015-04-09 DIAGNOSIS — M159 Polyosteoarthritis, unspecified: Secondary | ICD-10-CM

## 2015-04-09 MED ORDER — COLCHICINE 0.6 MG PO TABS: 0.6000 mg | ORAL_TABLET | Freq: Every day | ORAL | Status: DC

## 2015-04-09 MED ORDER — TRAZODONE HCL 100 MG PO TABS: 50.0000 mg | ORAL_TABLET | Freq: Every evening | ORAL | Status: DC | PRN

## 2015-04-09 NOTE — Patient Instructions (Signed)
Today we updated your med list in our EPIC system...    Continue your current medications the same...  We decided to try TRAZODONE (Desyrel) 100mg  tabs- try 1/2 to 1 tab at bedtime for sleep...  We refilled your colchicine 0.6mg  tabs- one tab daily for your gout...  OK to try to wean the MEDROL 4mg  to 4mg - 2mg  every other day...    And then go lower if able to 4mg - 0mg  every other day if able...  We also tried the OXYMIZER cannula to see if you can ambulate w/ lower oxygen flow rate...  Call for any questions...  Let's plan a follow up visit in 10mo, sooner if needed for problems.Marland KitchenMarland Kitchen

## 2015-04-09 NOTE — Progress Notes (Signed)
HPI  Review of Systems  Physical Exam  Patient ID: Rebecca [name], female   DOB: 24-Jul-1950, 65 y.o.   MRN: [id number] Subjective:   HPI 65 y/o WF known to me w/ mult med problems as noted below...  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  LABS 3/15:  Chems- BS=95 A1c=6.8 (needs low carb diet, exercise, wt loss) & Creat=1.5 (mild renal insuffic due to Lasix rx; Rec to decrease Lasix40 to 1 tab Qam...  2DEcho 4/15 showed norm LV sys function w/ EF=60-65%, normal wall motion, Gr1DD, normal valves, normal RV & PA pressures...   she had Cards eval [name] 4/15> he did not feel that invasive testing was warranted; she had 2DEcho but refused Myoview due to co-pay costs...   She remains on ADVAIR500Bid, SPIRIVA daily, & ProairHFA prn (uses 3-4 x daily "It really helps"); has Home O2 but dislikes the Liq O2 therefore not using & she wants a portable O2 concentrator to read 4L/min w/ exercise, 2L/min at rest.   She has continued to research her supplements and herbal meds>  She tells me that she is living on smoothies she makes out of juice, ice, baking soda, pomegranate syrup, & local honey;  Tumeric helps the arthritis in her knees and she notes that pinching her upper lip in the middle under her nose helps relieve musc cramps in her legs.  Ambulatory O2 Test 9/15>  O2 sat on RA at rest= 92% w/ pulse=86;  Nadir O2sat on RA after 2 laps= 84% w/ pulse=96...   CXR 9/15 showed COPD/emphysema, no acute abnormality...  LABS 9/15:  Chems- ok w/ BS=119, A1c=6.7, Cr=1.4;  CBC- ok...  ~  May 02, 2014:  2-54moROV & post hosp check>  [facility] w/ COPD exac (all records reviewed in [place]);  Disch on Levaquin750, Pred taper, NEBS w/ Xopenex, and continued on her Advair500Bid, Spiriva daily, Mucinex, Lasix40 prn >>    2/16 [facility] records indicated a thorough evaluation & excellent care provided but treatment plan was hampered by noncompliance- refused CPAP, declines chestPT, alternately stopping her O2 & demanding more O2,   LABS 2/16:  ABG- pH=7.46, pCO2=35, pO2=134 on 6L/min; Chems- ok x BS=169, A1c=6.5; CBC- normal; TSH=0.39 & FreeT4=1.54; Troponin/ D-dimer/ BNP- all wnl...  CXR 2/16 showed heart at upper lim of norm, hyperinflation, min bibasilar atx, NAD..Marland KitchenMarland Kitchen  EKG 2/16 showed NSR, rate88, low voltage, otherw wnl...  2DEcho 2/16 showed norm LVF w/ EF=55-60%, Gr1DD, norm valves and norm RV...  We reviewed the following medical problems during today's office visit >>     Hx mild OSA w/ mod desat, loud snoring, & leg jerks (on sleep study 2005)> she tried CPAP10 but stopped on her own & declined to repeat split night study & re-try CPAP rx; she says she's sleeping satis & wakes feeling refreshed...    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 1993; supposed to be on HomeO2- 2L rest & 4L exercise, NEBS w/ AlbutQid, Advair500Bid, Spiriva daily, Mucinex1-2Bid but not taking anything regularly; states that Flutter & Mucinex cause "spasms" in her back; states O2 doesn't help since she can't breathe thru her nose (she has been eval by ENT, CErnesto Rutherford; finally tried [facility] Netbut stopped after 275mos "fruitless"; prev stated she "can't breathe at all, can't do anything" etc; SOB  w/ ADLs etc; Note> she was eval at [facility] [place] in 2006 and last PFT (2011) showed FEV1= 1.5 (55%) & %1sec=59 ==> 3/15 she announced that she's been cured by taking a Magnesium supplement, along w/ herbal formula "Clear lungs" and "Lung tonic"; she had stopped her Oxygen;  Now using O2 up to 15L/min on her own since she can't breathe thru her nose- advised 2L/min rest & 4L/min exercise + humidity, nasal hygiene, etc;  Keep Pred 50m/d for now & ROV 3wks...    Hx AtypCP> baseline EKG w/ minor NSSTTWA; 2DEcho w/ norm LVF, EF=55-60%, Gr1DD, norm valves, norm  LA/RV; Myoview 2009 normal w/o ischemia & EF=55%; she notes some atypCP & saw DCherly Hensen4/15, she declined Myoview.    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; she knows not to eat sodium, elev legs, wear support hose; Labs 2/16 showed Cr=1.2-3    Impaired Gluc Tolerance, Metabolic syndrome> she refused meds; prev eval by DrEllison w/ rec for Metformin Rx but she rejects the DM diagnosis & refused meds; states "I'm eating healthier" (advised low carb low fat); Labs 2/16 showed BS~160, A1c=6.5..Marland KitchenMarland Kitchen   ?Hypothyroid> not currently on meds but she really wants thyroid Rx; prev TFTs all wnl; she prev had Endocrine in [place] prescribe Synthroid for her but she stopped going; clinically & biochemically euthyroid; Labs 2/16 showed TSH=0.39 & FreeT4=1.54    Morbid Obesity> peak weight ~340# before lap band surg 2009 by DrMMartin; lost to ~250# but then back up to 280-90#; refused f/u w/ [facility] due to cost of visits & lap-band adjustments; 3/16 weight isdown to 255# & we reviewed diet & exercise...    GI- GERD, IBS> she uses OTC PPI as needed; last colonoscopy was 1982 by DrPatterson & wnl, she refuses GI referral & refuses f/u colon etc...    GYN- s/p uterine cancer> [facility]    DJD, ?FM, VitD defic> prev on Vicodin, Tramadol, Aleve, VitD; prev eval by DrDeveshwar for rheum; now she states that [name] ("horse linament") applied topically has worked like a miracle & she has increased exercise etc...    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 PLAN>>  Asked to continue Pred20/d for now, Advair500Bid, Spiriva daily, NEBS w/ Xopenex Bid-Tid, Mucinex600-2Bid, fluids; we will ask DME for humidity on her O2 & switch to pulse dose where able; she is c/o nasal dryness & we reviewed nasal regimen/hygiene; we  offered second/third opinions (eg-DrKozlow), offered Hospice referral, but we ultimately decided on referral to [facility]... ROV in 3 wks... Note> [facility] her due to Medicare & [facility]; Hospice required home-bound status and [place]; [facility] ?unable to set up humidity & pulse-dose oxygen...   ~  May 21, 2014:  3wk ROV & recheck> GiArtis Delaypent most of this 2035mROV ventilating about her frustration over [facility], her oxygen, the Prednisone, her sister, and life in general; In the interval she developed "ROID RAGE" because of her Pred Rx at 16m51m her breathing was better but she flew into a rage & discussed this w/ her psychologist, [name]- then called us &[place]as instructed to wean down the Pred; now on 10mg7mnd she is much better but she states "I want off" and we outlined a  slow weaning schedule for her over the next month;  She also reports an interval trip to the ER w/ constipation...     OSA> she has declined a repeat sleep study, using O2 at night- 2L/min by [place]...    COPD/ Emphysema> on Pred taper (now 65m/d), Advair500Bid, Spiriva daily, Xopenex NEBS Tid, Ventolin HFA prn; based on PFTs in 2011 she had GOLD Stage 2 COPD, patient group B, but has clearly deteriorated over the last few yrs; [place] and she appears improved at present but declines to do f/u PFT at this time; she is very frustrated w/ [facility]- we have tried to get humidifiers set up & pulse-dose apparatus on her portable equipment (OK to adjust pulse-dose O2 to 3L/min rest & 5L/min exercise)...  We reviewed prob list, meds, xrays and labs> see below for updates >>  PLAN>> We will again try to get [facility] Hospitalto [facility] humidity & pulse-dose oxygen (OK'd for 3-5L/min flow);  Wean Pred according to slow taper printed on the AVS; ROV in 6wks w/ PFT...  ~  June 05, 2014:  2wk ROV & add-on for SOB> GArtis Delayis always SOB due to her severe GOLD  stage 3-4 COPD and her CC is "my life is miserable, I hate my life" she is angry at the world- her family (esp sister), [employer], Medicare, all of her doctors, etc... Today she focuses on not being able to breathe thru her nose & reminds me that [name] wanted to operate but I assessed her operative risk too high; says she can't use her oxygen due to this issue & [facility] has been unable to satisfy her needs in this area (she states that shje has to wait another 159moefore Medicare will permit changing DME providers)... She is quite tearful but still able to talk w/ loud volume and full sentences;  She has mild cough, sm amt beige sput, no hemoptysis, chr stable/ severe SOB/DOE w/ ADLs and any activity, difficulty caring for her pets...     OSA> she has declined a repeat sleep study, using O2 at night- 2L/min by [place], occas puts it in her mouth...    COPD/ Emphysema> on Pred taper (now 1028m), Advair500Bid, Spiriva daily, Xopenex NEBS Tid, Ventolin HFA prn; based on PFTs in 2011 she had GOLD Stage 2 COPD, patient group B, but has clearly deteriorated over the last few yrs; [place] and she appears to be stable at present; she agreed to repeat Spirometry today> c/w GOLD Stage 3-4 COPD (see below)... EXAM shows Afeb, VSS, O2sat=90% on RA in office;  HEENT- nasal congestion, erythema, turbinate hypertrophy, dev septum, throat= Mallampati 2, sl red, no exudates or lesions;  Chest- decr BS bilat w/o w/r/r & no signs of consolidation;  Ext- VI w/ trace edema;  Psyche- very distraught...  Last CXR 2/16 showed heart at upper lim of norm, hyperinflation, min bibasilar atx, NAD  Spirometry 06/2014 showed FVC=1.61 (50%), FEV1=0.88 (35%), %1sec=55, mi-flows are reduced to 18% predicted... 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 PLAN>>  We discussed referral to medical center to consider ENT eval to help her nasal obstruction, & eval by pulmonary team to see if they can think  of anything else to help this nice lady...  ~  Jul 02, 2014:  27mo37mo & [name] reports good days and bad- today is a good day & she thinks maybe because she didn't take Xanax last  PM (she will investigate);  She was seen by [facility]NT at [facility] & they are hoping NOT to have to do surg, now on nasal srays + Saline & an ointment, they removed ear wax & plan hearing eval soon;  She is using a new head-set w/ microphone-like oxygen delivery device instead of nasal cannula;  She notes some back spasms and using her Flexeril;  She brought a copy of COPD [employer]est & an article that states if a Medicare pt requires a different O2 delivery system, then the DME must comply- she is unable to use the mult portable tanks due to weight, awkward maneuvering, can't manipulate the controls, etc=> we will request an [employer] ONE portable concentrator for her...    She remains on Home O2 at 2L/min, Advair500Bid, Spiriva daily, Xopenex NEBS Tid, Ventolin HFA prn;  Chest is clear w/ decr BS bilat, no w/r/r;  Heart shows RR, no m/r/g... We reviewed prob list, meds, xrays and labs> see below for updates >>  PLAN>>  We will request the port oxygen concentrator for her use=> letter written & sent to [facility];  She will f/u w/ ENT & has Pulm 2nd opinion at [facility] in June- ROV in 70mo..  ~  Jul 16, 2014:  2wk ROV & add-on appt requested for mult issues, here w/ her friend PVickii Chafetoday... States she's been more SOB for the past week w/ thick mucus, left ankle pain & she goes into great detail about prev ankle sprain yrs ago, but [name] Dx prob gout & rec Ibuprofen/ Indocin/ Colchicine but it's still sore w/ decr ROM- I told her we would need to send her to Ortho if Depo120, MEDROL taper (she won't take Pred due to "rage") didn't resolve the issue... She states that 2wks ago her breathing was the best it's been in years! But now c/o cough, congestion, incr SOB "it's so bad", says she's desat at home & increased her O2 to 8L/min but ran out of tanks fast! She's  taking OTC Magnesium supplements; O2sat here is 93% on RA!!!  [facility] and has appt for pulmonary 2nd opinion in June... She tells me about a posting on Facebook about "6 deadly antibiotics" & Levaquin was one of them...     We reviewed prob list, meds, xrays and labs> see below for updates >> she wants a letter so the post office will bring mail to her door...  LABS 5/16:  Chems- wnl w/ BS=90, Cr=1.02;  Mg=2.2, Ca=9.0;  Uric=6.6..Marland KitchenMarland KitchenPlan>>  We discussed Rx w/ Depo120, Medrol810m tapering sched, and to continue regular dosing w/ Home O2 at 2-3L/min, Advair500Bid, Spiriva daily, Xopenex NEBS Tid, Ventolin HFA prn, & Guaifenesin...   ~  September 04, 2014:  6wk ROV & GiArtis Delayas [facility]>>      [facility]> HxCOPD w/ revers component and chr nasal obstruction L>R, nasal crusting, bilat inferior nasal turbinate hypertrophy, etc;  They rec nasal saline, nasal steroid spray, mupirocin ointment, and breathe right nasal strips; they will consider surg if not improved...       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 CTChest 08/21/14 showed severe centrilobular emphysema, diffuse bronchial thickening, mild mucus plugging, no adenopathy, no lung nodules etc; also showed mild Ao & coronary calcif, lap band at GE junction, splenosis, DJD  Tspine... PLAN> They decided to slowly wean the Medrol, continue Advair & wean to Advair250Bid, continue Spiriva, continue NEBS & AlbutHFA; rec to continue her oxygen (but she continues to use it "prn"), consider repeat sleep study & CPAP titration, consider referral to [place] rehab, referred for nutritional counseling & ROV 67mo..      She reports breathing well recently, she has a new roommate, eating out a lot, went to "theclub", then one day over the weekend=> incr SOB, she's been taking Lasix80 Qam, rambling hx w/ flight of ideas regarding coffee, caffeine, not urinating enough, she had to [facility] [place];  They decreased her Advair250-Bid, Medrol654md, continue Nebs (she's using Bid because she "cramps up" if she does it tid); she is awaiting nutritional counseling, pulm rehab referral, oxygen titration testing, sleep study...  We reviewed prob list, meds, xrays and labs> see below for updates >>  PLAN>>  She will maintain close contact & [facility]/ [facility];  Reminded to avoid sodium & carbs- handout given; plan ROV here in 2-69m45mo  ~  December 05, 2014:  69mo50mo & [name]'s new PCP is in [place]> she persists w/ 4+somatic complaints and signif health related anxiety; [facility] "they kicked me out due to CP- I can't go back until I get this evaluated"; she's been seeing Dr. SaraGermain Osgoodlm Fellow w/ [facility]mNatoman[facility] & I have reviewed all the Care Everywhere notes-- several telephone encounters including their last 45 min conversation well documented, not much reversibility, they hoped for small improvement w/ Rehab exercise & staying on meds but she won't fill rx, can't afford, tearful/ frustrated/ depressed but refused psyche consult etc... Pt indicates that DrStephens said there was nothing wrong w/ her heart & pt doesn't want cardiac eval; DrStephens wants her to wean the Medrol but she's breathing  better on 4mg/88mshe wants 6mg/d569mrec to compromise w/ 6mg al41m/ 4mg Qod42mhe wants a liq oxygen system again- she is quite adamant (being a former "nurse")Insurance claims handlershe needs 4-5L/min when active and tank only lasts 1-2H; she uses 4L/min Qhs as well; but O2sat=92% on RA at rest today; she refuses to go back to rehab, therefore encouraged to do it daily on her own; she has not pursued further bariatric considerations- she is working w/ Drlori's office regarding eating disorder "he doesn't think I'm depressed"...      IMP/PLAN>>  [name] has Artis Delayere airflow obstruction/ emphysema w/ little reversibility; Rec to take SYMBICORT160-2spBid & Spiriva daily; use Medrol 4mg tabs79mmg alt w56mmg Qod; X46mnex NEBS Tid;  Alprax 0.5mg - 1/2 t69m tab tid; ROV w/ me in 380mo...  ~  N19mober 7, 2016:  380mo ROV & pt 669momult complaints> head "stopped-up", ears congested, tried pseudophed but "it made me sick", doesn't want ENT f/u, rec to try Saline nasal mist & phenylephrine decongestant;  She is  discouraged by her weight, can't seem to lose any, she described in detail her meals from yest (we discussed calories "in" & "out", eat less, burn more), she says local Gym won't take her w/ oxygen;  She asked about Stem Cell treatment for COPD- I offered to send her to [facility] to inquire about this or lung transplant;  She is very frustrated- eating is an issue because when she fills up she can't breathe well due to her prev LapBand surg she says, if she belches she feels better...       Rec to continue Medrol29m- she incr herself to 634md, Symbicort160-2spBid, Spiriva daily, Xopenex NEBS Tid (only doing it once/d), Guaifenesin400- taking 6/d w/ fluids, O2- she is not using it at rest, using 5L/min w/ exercise; she takes Lasix40, Tamadol50, & Xanax0.5 as needed...   ~  April 09, 2015:  67m71moV & [name] continues to complain about ever worsening breathing- DOE, dry cough, chest tightness, leg cramps, difficulty sleeping; notes good days and bad-  "I got worked up over the election" she says; she thinks the zoloft is helping but she can't sleep w/ it- rec to try TRAZODONE instead (100m52m/2 to 1 tab Qhs);  Currently taking Oxygen that she adjusts herself, Medrol4mg/39mNEBS w/ Xopenex Tid (but she says insurance won't cover it), Symbicort160-2spBid, Spiriva daily, Lasix40 (just taking prn)... I suggested that she try the Oximizer=> given to pt to try...    See 08/2014 note above for summary [facility]  She [facility] at [facility]> reviewed in [facility] w/ NSR, wnl, NAD; prev 2DEcho 04/2014 showed norm LVF w/ EF=55-60%, norm wall motion, G1DD, RVF was also wnl; she [facility] some chest [facility] but pt never followed thru due to dyspnea & O2 requirements; they also considered RHC but she never returned as requested...    She has seen DrZSmith for SportsMed/ PM&R> c/o back pain & leg pain, his note is reviewed... EXAM shows Afeb, VSS, O2sat=91% on RA in office;  HEENT- nasal congestion, erythema, turbinate hypertrophy, dev septum, throat= Mallampati 2, sl red, no exudates or lesions;  Chest- decr BS bilat w/o w/r/r & no signs of consolidation;  Heart- RR w/o m/r/g;  Abd- Obese, panniculus, soft/ nontender;  Ext- VI w/ trace edema;  Psyche- very distraught...  Ambulatory Oximetry on 3L/min [place]>  O2sat=96% on 3L/min at rest w/ HR=107/min;  She ambulated 1 Lap on 3L/min w/ nadir O2sat=89% w/ HR=125/min...  Ambulatory Oximetry on 3L/min w/ pendant oxymizer>  O2sat=99% on 3L/min at rest w/ pulse=98/min;  She ambulated one lap & stopped due to dyspnea w/ nadir O2sat=91% w/ HR=123/min... IMP/PLAN>>  She will try the Oximizer at home, switch from zoloft to TRAZODONE 100mg-367m to 1 tab Qhs, try to wean MEDROL 4mg is60mle to 4mg Qod567m increments; rov in 3 months time...           Problem List:    OBSTRUCTIVE SLEEP APNEA (ICD-327.23) - sleep study 2005 showed RDI=15 w/ desat to  78%, loud snoring & +leg jerks> on CPAP 10, prev used intermittently but now not at all... ~  11/12:  She has agreed to a new Sleep Study- split night protocol, to recheck her OSA & determine CPAP needs (check ABG if poss too)==> she never followed thru w/ the study. ~  Pt states that she is resting satis and wakes refreshed, energy better... ~  12/15:  she reports not resting due to left shoulder/ arm pain & we will refer to Ortho (she saw Dr. Gardenia Phlegm)... ~  She continues to refuse repeat sleep study, CPAP, and declines f/u w/ sleep med...  ENT eval at [facility] [place]- for chronic nasal congestion & obstruction> he is working to get her nares open to facilitate her breathing & nasal O2 Rx...  ~  Spring 2016> ENT eval by DrPlonk & his notes are reviewed> HxCOPD w/ revers component and chr nasal obstruction L>R, nasal crusting, bilat inferior nasal turbinate hypertrophy, etc;  They rec nasal saline, nasal steroid spray, mupirocin ointment, and breathe right nasal strips; they will consider surg if not improved...   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 supposed to be on NEBULIZER Qid, ADVAIR 500Bid + SPIRIVA daily (compliance is a serious issue- she freq stops all meds) ==> she had a second opinion consult at [facility] by DrAdair in 2006... ~  A1AT level is normal 218 (83-200) 3/09... ~  baseline CXR w/ borderline Cor, COPD, interstitial scarring/ atelectasis/ obesity... ~  PFTs 3/07 showed FVC=2.74 (82%), FEV1=1.67 (62%), %1sec=61, mid-flows=27%pred... improved from 2005. ~  CTChest in 2007 & 4/09 showed severe centrilob emyphysema, + interstitial fibrosis, sm HH, left renal cyst... neg for PE... ~  2010:  breathing improved w/ weight reduction after Lap-band surg... ~  2/11: COPD exac w/ neg CXR (chr changes, NAD), Rx w/ Depo/ Pred/ Avelox/ Mucinex/ NEBS/ Advair/ Spiriva/ etc... ~  PFTs 3/11 showed FVC= 2.57 (73%), FEV1= 1.51 (55%), %1sec=59, mid-flows= 21%pred. ~   Noncompliant w/ Rx & O2 thru 2012... On disability since 2011 & finally got Medicare coverage 9/12... ~  11/12: presents w/ worsening breathing & dyspnea w/ ADLs ==> we outlined further eval w/ 2DEcho, Sleep Study; Rx w/ NEBS QID, Advair500Bid, Spiriva daily, Mucinex, Oxygen, Lasix, Zoloft, Xanax, etc (she declined to proceed w/ the sleep study, 2DEcho, etc)... 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  ~  4/13: she is c/o the nasal O2 not helping since it's hard to breathe thru nose & wants ENT referral==> saw [name] who wanted to do surg but she has severe COPD & hi risk; asked to start PulmRehab & she agrees... ~  10/13: she did PulmRehab for 79mo6-7/13 but quit since it was "fruitless"- barely got to do any exercise since it was so difficult getting to the facility,etc; still c/o nasal problems limiting her benefit from O2 & we discussed the poss of ?transtracheal oxygen? ~  9/14: exsmoker quit 1993; supposed to be on HomeO2- 2L rest & 4L exercise, NEBS w/ Albut, Advair500Bid, Spiriva daily, Mucinex1-2Bid but not taking anything regularly; states that Flutter & Mucinex cause "spasms" in her back; states O2 doesn't help since she can't breathe thru her nose (she has been eval by ENT, CErnesto Rutherford; finally tried PHealth Netbut stopped after 261mos "fruitless"; states she "can't breathe at all, can't do anything" etc; SOB w/ ADLs=> O2 recert today (see below)... Note> [facility] and last PFT (2011) showed FEV1= 1.5 (55%) & %1sec=59... OXYGEN RE-CERT done today... ~  3/15: see above- pt states that a magnesium supplement along w/ herbal supplements "clear lungs" and "lung tonic" have made all the difference & she no longer needs oxygen, exercising regularly & much improved; still taking Advair500, Spiriva, AlbutHFA prn...  ~  CXR 3/15 showed underlying COPD, atelectasis in  RML, DJD in TSpine, NAD; Rec to  take OTC Mucinex600-2Bid w/ fluids. ~  CXR 9/15 showed COPD/emphysema, no acute abnormality... ~  11/07/13: Ambulatory O2 Test 9/15> O2 sat on RA at rest= 92% w/ pulse=86;  Nadir O2sat on RA after 2 laps= 84% w/ pulse=96...  ~  11/17/13: she presented w/ acute SOB, wheezing, COPD exac> treated w/ NEBS, Depo120, Pred taper, Mucinex1200Bid, fluids, & continue the Spiriva & Oxygen; ch her Advair to NEBS w/ Xopenex & Budesonide regularly... ~  10/15: she is much improved w/ Pred taper but she wants off; reminded to use NEBS w/ Xopenex & Budes Bid every day... ~  12/15: she is off the Pred, but also stopped the Xopenex/ Budesonide due to cost; she has gone back on her [place], plus Albut via NEBS, Mucinex/ Fluids/ etc... ~  [facility] 2/16 by Gaylyn Cheers w/ COPD exac> disch on Oxygen, Pred taper, Levaquin750, NEBS w/ Xopenex, Advair500, Spiriva, Mucinex, etc... ~  3/16: post hosp check, very frustrated that she can't get things the way SHE wants them; c/o O2 since she can't breathe thru nose & we reviewed nasal hygiene, huidified O2, ENT re-eval; on Pred taper, off Levaquin now, Advair500Bid, Spiriva daily, NEBS w/ Xopenex Bid, Mucinex (not taking regularly & asked to do 2Bid + fluids); we will try [facility] COPD program & recheck pt in 3 weeks... ~  4/16: she continues to be very frustrated w/ her disease & her care- can't breathe thru her nose & she feels the O2 is not doing her any good; we discussed refer to [place] for their review to see if there is anything they can do.  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 ~  PFT 08/21/14 showing FVC=1.63 (50%),  FEV1=0.63 (25%), %1sec=39;  FEV1 post bronchodil= 0.97 (39%) for a 50% improvement; DLCO was 22% predicted...  ~  CTChest 08/21/14 showed severe centrilobular emphysema, diffuse bronchial thickening, mild mucus plugging, no adenopathy, no lung nodules etc; also showed mild Ao & coronary calcif, lap band at GE junction, splenosis, DJD Tspine. ~  Summer 2016: She had ENT & PULM evaluations at [facility], continued to see DrStephens (PulmFellow w/ DrHaponik)  ATYPICAL CHEST PAIN (ICD-786.50) - eval by DrWall in 2009. ~  baseline EKG w/ NSR, PVC's, NSSTTWA, NAD... ~  2DEcho 2/05 showed mild MR/ TR, norm LA/ RV, EF=50-55%... ~  NuclearStressTest 4/09 was norm- no ischemia, EF=55%... similiar to prev studies at [place] & 2007... ~  3/15: she notes some atypCP & requests a cardiac referral for eval- prev seen by DrWall, we will call for Cards consult per her request... ~  EKG 3/15 showed NSR, rate91, occas PVCs, otherw wnl... ~  4/15: she had Cards eval DrNishan> he did not feel that invasive testing was warranted; she had 2DEcho but refused Myoview due to co-pay costs...  ~  2DEcho 4/15 showed norm LV sys function w/ EF=60-65%, normal wall motion, Gr1DD, normal valves, normal RV & PA pressures... ~  EKG 2/16 showed NSR, rate88, low voltage, otherw wnl... ~  2DEcho 2/16 showed norm LVF w/ EF=55-60%, Gr1DD, norm valves and norm RV.  VENOUS INSUFFICIENCY, CHRONIC (  ICD-459.81) - Hx chr ven insuffic w/ edema... on LASIX 72m- 1-2 daily (she self medicates)... ~  Labs 3/15 showed Cr=1.5 and she is asked to decr the Lasix to 445mQam...  IMPAIRED GLUCOSE TOLERANCE >>  METABOLIC SYNDROME X (ICSMO-707.8- she is on diet alone (refused Metformin therapy)... ~  labs 9/08 showed BS=120... FBS 5/08 was 93, HgA1c=7.1 ~  labs 5/10 showed BS= 182, A1c= 6.4 ~  labs 2/11 showed BS= 76, A1c= 6.5 ~  Labs 5/12 showed BS= 103, A1c= 6.5 ~  She saw DrEllison 4/13 w/ A1c=7.1 but she rejects the diagnosis of Diabetes & refused  Metform50020m... ~  10/13:  We discussed IMPAIRED GLUCOSE TOLERANCE & need for low carb wt reducing diet and incr exercise... 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 ~  max weight ~340# before lap band surg 7/09 by DrMartin. ~  weight 12/09 = 291# ~  weight 5/10 = 265# ~  weight 11/10- = 254# ~  weight 2/11 = 260# ~  Weight 5/12= 290# ~  Weight 11/12 = 286# ~  Weight 4/13 = 280# => BMI=45 ~  Weight 10/13 = 289# ~  Weight 9/14 = 281# ~  Weight 3/15 = 270# ~  Weight 9/15 = 267# ~  Weight 12/15 = 274# ~  Weight 3/16 = 255# => post hosp... ~  Weight 4/16 = 271# ~  Weight 7/16 = 272# ~  Weight 11/16 = 276#  ? Hx HYPOTHYROIDISM (ICD-244.9) - off thyroid meds since 1/10... she was started on Synthroid and followed by DrRSmith in [place] & last seen 7/09- note reviewed... she refuses f/u by [name]- we will recheck TSH off Synthroid Rx... ~  labs 5/10 off Synthroid since 1/10 showed TSH= 1.13 ~  labs 2/11 showed TSH= 1.82 ~  Labs 5/12 showed TSH= 1.81 ~  2013: She wanted to restart Thyroid hormone but TSH remains normal> referred to Endocrine for further eval but she was upset w/ DrEllison's eval; she will decide if she wants to pursue another opinion... ~  9/14: not currently on meds but she really wants thyroid Rx; prev TFTs all wnl; she prev had Endocrine in [place] prescribe Synthroid for her but she stopped going; clinically & biochemically euthyroid...  ~  3/15: on Synthroid100-12 tab daily; Labs showed TSH= 3.00 ~  She stopped the  Synthroid again... ~  Labs 2/16 not on meds showed TSH=0.39 & FreeT4=1.54   GERD (ICD-530.81) - prev on Nexium but off all meds since 1/10... IRRITABLE BOWEL SYNDROME (ICD-564.1) - colonoscopy 1982 by DrPatterson was WNL... She has refused f/u GI eval & f/u colonoscopy...  ?KIDNEY STONE Hx >> pt said she passed a kidney stone 3/11 w/o any medical attention, w/o work up or proof of same; entry made here for future reference if needed.  UTERINE CANCER, HX OF (ICD-V10.42) - s/[place] & [place]   DEGENERATIVE JOINT DISEASE (ICD-715.90) - on VICODIN tid prn,  ANAPROX 220m39m  Bid prn, ULTRAM 614m Prn... ? 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 she was on Wellbutrin & Vicodin when last seen in 2006... VITAMIN D DEFICIENCY (ICD-268.9) - Vit D level 5/10 = 16... rec> start OTC Vit D 2000 u daily. ~  9/14: on Vicodin, Tramadol, Aleve, VitD, etc; prev eval by DrDeveshwar for rheum; now c/o pain in left knee (she thinks it's "siatica"), can't exercise & wants to [facility]- ok... ~  3/15: she states that topical [name] ("horse linament") rubbed into knees really helps but she wants to keep the Vicodinon hand just in case...  Hx of HEADACHE (ICD-784.0)  DYSTHYMIA (ICD-300.4) - off Celexa,  off Zoloft, and on ALPRAZOLAM 0.547mid as needed... she [place]/ [place]... prev seeing psychologist [name] for eating disorder counselling... ~  4/13: she mentioned seeing [name] again for eating disorder & counseling for depression etc... 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  ~  3/15: she has the Alpraz0.14mg59mr prn use but  seldom uses it; still sees [name] on occas but doesn't think she needs to continue... ~  11/15:  She wants antidepressant med & we discussed Zoloft tria 100mg7m2 => 1 tab daily... She still sees Psychologist [name]. ~  3/16:  She is off the Zoloft & states the AlpraSharin Graveoo strong (advised cutting the med & try lower dose for the desired effect...  HEALTH MAINTENANCE:   ~  GI:  Per seen by DrPatterson but last colon was 1982 & she refuses follow up... ~  GYN:  [facility]:  She had Pneumovax in the past; she declines Flu shots and Tetanus shots...   Past Surgical History  Procedure Laterality Date  . 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 Appendectomy    . Cholecystectomy  1982  . [place]  . [place]  [place]  . Laparoscopic gastric banding  08/2007    Dr. MartiHassell Doneutpatient Encounter Prescriptions as of 04/09/2015  Medication Sig  . albuterol (PROVENTIL HFA;VENTOLIN HFA) 108 (90 BASE) MCG/ACT inhaler Inhale 2 puffs into the lungs every 6 (six) hours as needed for wheezing or shortness of breath.  . ALPRAZolam (XANAX) 0.5 MG tablet TAKE 1/2 TO 1 TABLET(S) BY MOUTH THREE TIMES DAILY AS NEEDED FOR NERVES. NOT TO EXCEED 3 TABS/DAY.  . B Complex-C-Folic Acid (B-COMPLEX BALANCED) TABS Take 1 tablet by mouth daily.  . budesonide-formoterol (SYMBICORT) 160-4.5 MCG/ACT inhaler Inhale 2 puffs into the lungs 2 (two) times daily.  . cetirizine (ZYRTEC) 10 MG tablet Take 10 mg by mouth daily.    . Cholecalciferol (VITAMIN D-3) 5000 UNITS TABS Take 1 tablet by mouth daily.  . Coconut Oil 1000 MG CAPS Take 4 capsules by mouth daily.  . cyclobenzaprine (FLEXERIL) 10 MG tablet Take 1 tablet (10 mg total) by mouth 3 (three) times daily as needed for muscle spasms.  . furosemide (LASIX) 40 MG tablet TAKE 1-2 TABLETS ONCE A DAY AS NEEDED FOR SWELLING  . Magnesium 400 MG CAPS Take  2 capsules by  mouth daily.  . methylPREDNISolone (MEDROL) 4 MG tablet TAKE 1 TABLET (4 MG TOTAL) BY MOUTH DAILY. TAKE AS DIRECTED  . naproxen sodium (ANAPROX) 220 MG tablet Take 2 tabs by mouth once daily   . sertraline (ZOLOFT) 100 MG tablet TAKE 1/2 TABLET BY MOUTH DAILY  . SPIRIVA HANDIHALER 18 MCG inhalation capsule PLACE 1 CAPSULE (18 MCG TOTAL) INTO INHALER AND INHALE DAILY.  . traMADol (ULTRAM) 50 MG tablet TAKE 1 TABLET BY MOUTH  3 TIMES DAILY   AS NEEDED FOR PAIN  . vitamin C (ASCORBIC ACID) 250 MG tablet Take 250 mg by mouth daily.  Marland Kitchen allopurinol (ZYLOPRIM) 100 MG tablet Take 1 tablet (100 mg total) by mouth daily. (Patient not taking: Reported on 04/09/2015)  . colchicine 0.6 MG tablet Take 1 tablet (0.6 mg total) by mouth daily.  Marland Kitchen etodolac (LODINE XL) 400 MG 24 hr tablet Take 1 tablet (400 mg total) by mouth 2 (two) times daily as needed. (Patient not taking: Reported on 04/09/2015)  . levalbuterol (XOPENEX) 1.25 MG/3ML nebulizer solution Take 1.25 mg by nebulization 3 (three) times daily. (Patient not taking: Reported on 04/09/2015)  . traZODone (DESYREL) 100 MG tablet Take 0.5-1 tablets (50-100 mg total) by mouth at bedtime as needed for sleep.    Allergies  Allergen Reactions  . Penicillins     REACTION: nausea and vomiting    Current Medications, Allergies, Past Medical History, Past Surgical History, Family History, and Social History were reviewed in [employer] record.   Review of Systems:         See HPI - all other systems neg except as noted... The patient complains of dyspnea on exertion and difficulty walking; chest discomfort, & leg weakness.  The patient denies anorexia, fever, weight loss, weight gain, vision loss, decreased hearing, hoarseness, syncope, prolonged cough, headaches, hemoptysis, abdominal pain, melena, hematochezia, severe indigestion/heartburn, hematuria, incontinence, suspicious skin lesions, transient blindness, depression,  unusual weight change, abnormal bleeding, enlarged lymph nodes, and angioedema.    Objective:   Physical Exam:    WD, Morbidly Obese, 65 y/o WF in no acute distress but c/o dyspnea/ wheezing... GENERAL:  Alert & oriented; pleasant & cooperative... HEENT:  [place]/AT, EOM-wnl, PERRLA, EACs-clear, TMs-wnl, NOSE-clear, THROAT-clear & wnl. NECK:  Supple w/ fairROM; no JVD; normal carotid impulses w/o bruits; no thyromegaly or nodules palpated; no lymphadenopathy. CHEST:  no accessory musc use, decr BS bilat, no wheezing/ rales/ rhonchi... HEART:  Regular Rhythm; without murmurs/ rubs/ or gallops heard... ABDOMEN:  Obese, soft & nontender; normal bowel sounds; no organomegaly or masses detected. EXT: without deformities, mod arthritic changes; no varicose veins/ +venous insuffic/trace edema. NEURO:  CNs intact; no focal neuro deficits... DERM: few ecchymoses, no rash etc...  RADIOLOGY DATA:  Reviewed in the EPIC EMR & discussed w/ the patient...  LABORATORY DATA:  Reviewed in the EPIC EMR & discussed w/ the patient...   Assessment & Plan:    ENT>> she was working w/ DrPlonk at [employer]; encouraged to return for further eval...   OSA>  As prev noted she needs new sleep study & appropriate new CPAP apparatus to facilitate compliance but she declines the repeat study or sleep med referral...  COPD/ Emphysema>  Acute exac 2/16 improved after hosp in [place] w/ Levaquin, Pred taper, NEBS Bid, Avair500, Spiriva, Mucinex etc... Encouraged to stay on her O2 regularly & we will add Humidity, needs max nasal hygiene vs ENT follow up; continue NEBS/ Advair500/ Spiriva regularly & use the Mucinex;  we will [facility] the [facility] home Care home COPD program; needs to lose wt & incr exercise... 4/16>  Spirometry w/ severe airflow obstruction and GOLD Stage 3-4 COPD; optimize meds and refer to [facility]- ENT & Pulm divisions to see if there is anything that they can do to help... 5/16>  She has seen ENT & Pulm 2nd opinion  is pending; she cannot manipulate the port O2 tanks and we will request an INOGEN One- port O2 concentrator for her use...  5/16>  States breathing worse, asked to use all regular meds regularly & we will add Depo120, Medrol taper... 6/16>  She had 2nd opinion consult w/ DrStephens/ DrHaponik at [facility] see above... 10/16>  Pt very frustrated after working w/ the [employer] division at [employer] for several months- Nebs, Advair500, Spiriva, Medrol, Oxygen, pulm rehab, etc...  11/16>  On Medrol39m- she incr herself to 64md, Symbicort160-2spBid, Spiriva daily, Xopenex NEBS Tid (only doing it once/d), Guaifenesin400- taking 6/d w/ fluids, O2- she is not using it at rest, using 5L/min w/ exercise; she takes Lasix40, Tamadol50, & Xanax0.5 as needed...  Hx CP w/ neg cardiac evals including prev Myoviews; 2DEchos w/o incr PA pressures... 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 2/16> [facility], [facility] and norm RV...  OBESITY>  She had remote lap band surg by DrMMartin; Met syndrome, must get wt down etc; she is a lap band failure & prev counseling by [name]... 3/16> weight down to 255# post hosp... 4/16> weight is back up to 271# despite all efforts...  ? Hx Hypothy>  TFTs remain normal off meds- no problem...  GI>  GERD> she denies symptoms & uses OTC meds prn...  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 C/O right ankle pain, eval by DrZSmith> no better on his anti-inflamm rx so we will Rx w/ Depo120 & Medrol taper...  Dysthymia>  Much stress in her life, on Alpraz prn; has embraced alt lifestyle...   Patient's Medications  New Prescriptions   TRAZODONE (DESYREL) 100 MG TABLET    Take 0.5-1 tablets (50-100 mg total) by mouth at bedtime as needed for sleep.  Previous Medications   ALBUTEROL (PROVENTIL HFA;VENTOLIN HFA) 108 (90 BASE)  MCG/ACT INHALER    Inhale 2 puffs into the lungs every 6 (six) hours as needed for wheezing or shortness of breath.   ALLOPURINOL (ZYLOPRIM) 100 MG TABLET    Take 1 tablet (100 mg total) by mouth daily.   ALPRAZOLAM (XANAX) 0.5 MG TABLET    TAKE 1/2 TO 1 TABLET(S) BY MOUTH THREE TIMES DAILY AS NEEDED FOR NERVES. NOT TO EXCEED 3 TABS/DAY.   B COMPLEX-C-FOLIC ACID (B-COMPLEX BALANCED) TABS    Take 1 tablet by mouth daily.   BUDESONIDE-FORMOTEROL (SYMBICORT) 160-4.5 MCG/ACT INHALER    Inhale 2 puffs into the lungs 2 (two) times daily.   CETIRIZINE (ZYRTEC) 10 MG TABLET    Take 10 mg by mouth daily.     CHOLECALCIFEROL (VITAMIN D-3) 5000 UNITS TABS    Take 1 tablet by mouth daily.   COCONUT OIL 1000 MG CAPS    Take 4 capsules by mouth daily.   CYCLOBENZAPRINE (FLEXERIL) 10 MG TABLET    Take 1 tablet (10 mg total) by mouth 3 (three) times daily as needed for muscle spasms.   ETODOLAC (LODINE XL) 400  MG 24 HR TABLET    Take 1 tablet (400 mg total) by mouth 2 (two) times daily as needed.   FUROSEMIDE (LASIX) 40 MG TABLET    TAKE 1-2 TABLETS ONCE A DAY AS NEEDED FOR SWELLING   LEVALBUTEROL (XOPENEX) 1.25 MG/3ML NEBULIZER SOLUTION    Take 1.25 mg by nebulization 3 (three) times daily.   MAGNESIUM 400 MG CAPS    Take 2 capsules by mouth daily.   METHYLPREDNISOLONE (MEDROL) 4 MG TABLET    TAKE 1 TABLET (4 MG TOTAL) BY MOUTH DAILY. TAKE AS DIRECTED   NAPROXEN SODIUM (ANAPROX) 220 MG TABLET    Take 2 tabs by mouth once daily    SERTRALINE (ZOLOFT) 100 MG TABLET    TAKE 1/2 TABLET BY MOUTH DAILY   SPIRIVA HANDIHALER 18 MCG INHALATION CAPSULE    PLACE 1 CAPSULE (18 MCG TOTAL) INTO INHALER AND INHALE DAILY.   TRAMADOL (ULTRAM) 50 MG TABLET    TAKE 1 TABLET BY MOUTH  3 TIMES DAILY   AS NEEDED FOR PAIN   VITAMIN C (ASCORBIC ACID) 250 MG TABLET    Take 250 mg by mouth daily.  Modified Medications   Modified Medication Previous Medication   COLCHICINE 0.6 MG TABLET colchicine 0.6 MG tablet      Take 1 tablet (0.6  mg total) by mouth daily.    Take 1 tablet (0.6 mg total) by mouth daily.  Discontinued Medications   TIOTROPIUM (SPIRIVA HANDIHALER) 18 MCG INHALATION CAPSULE    PLACE 1 CAPSULE (18 MCG TOTAL) INTO INHALER AND INHALE DAILY.

## 2015-04-18 ENCOUNTER — Telehealth: Payer: Self-pay | Admitting: Pulmonary Disease

## 2015-04-18 DIAGNOSIS — J449 Chronic obstructive pulmonary disease, unspecified: Secondary | ICD-10-CM

## 2015-04-18 NOTE — Telephone Encounter (Signed)
Spoke with pt, states she is is out of levalbuterol, needs this filled asap but is having difficulty getting this filled through insurance.   Pt [facility] in [place] to see what is needed as no number was listed below for "customer care", Pharmacist states that levalbuterol is not covered by insurance- covered alternatives are albuterol or accuneb.  Pt states she cannot tolerate albuterol.  PA number is [phone] Called above PA number, apparently this number is incorrect for pt (after 22 minutes on phone).  Correct PA number is [phone]. Called this second PA number, was put through to PA department, line was unable to go through after initial transfer. Called back, was put through PA line  At [phone]- PA initiated over the phone- will take 24-72 hours to get a decision on this PA.  Reference #: [id number]   Will await PA decision

## 2015-04-18 NOTE — Telephone Encounter (Signed)
Customer care calling about needing PA on this ASAP she is out order needs to be faxed to [phone].Hillery Hunter

## 2015-04-19 NOTE — Telephone Encounter (Signed)
Pt called back asking why this has not been approved yet. Advised pt that this is a process that her insurance has to approve before she can get the medication. Pt states she will call her insurance company and inquire when this medication will be approved. Advised pt we will call her with updates of PA.

## 2015-04-22 NOTE — Telephone Encounter (Signed)
Called [phone] to check on PA. Spoke w/ Sloan (PA rep). This was denied on 04/19/15. [employer] denied on part D benefits/medicare. The denial is going to be faxed over to [place] to the upfront fax machine. Will await fax.

## 2015-04-23 NOTE — Telephone Encounter (Signed)
Forms received for appeal.  Called [phone] option 3 to initiate expedited appeal as pt is out of med and needs this asap.  Appeal forms are being faxed to our office, verified fax #.   Case # for this appeal: [id number] Mcarthur Rossetti member id: [id number] Fax appeal back to [phone].  Will await forms.

## 2015-04-26 NOTE — Telephone Encounter (Signed)
[phone] ask for a supervisor per Patient

## 2015-04-26 NOTE — Telephone Encounter (Signed)
Pt called back with Medicare rep on the line. The medicare rep did not know why the medication is going through [facility] and not Medicare part B. Called [name] Oppenheim and the medication is being filed under Medicare part B. Appeal has already been faxed to [facility].

## 2015-04-26 NOTE — Telephone Encounter (Signed)
Called and spoke to pt. Informed her the Xopenex has been denied and we are currently working on the appeal to get it approved. Pt states she was told by Medicare that it would approved under medicare part B. Called Medicare and spoke to [place] (member line) and was advised to contact the provider line at [phone]. I contacted the provider line and was unable to reach a representative. Gerald Stabs states he is unable to give me any information without the pt on the phone as well. Called and spoke to pt and informed her of the issue trying to reach a rep without her consent. Pt stated she would call them back. Advised pt that I will also send the appeal back to [facility]. Pt verbalized understanding. Will await pt's call back.

## 2015-04-29 NOTE — Telephone Encounter (Signed)
Rebecca [name] - any response on appeal?  Please advise.

## 2015-04-30 NOTE — Telephone Encounter (Signed)
I have not received any information on the appeal. Will update chart when I have received information.

## 2015-05-01 ENCOUNTER — Telehealth: Payer: Self-pay | Admitting: Pulmonary Disease

## 2015-05-01 MED ORDER — LEVALBUTEROL HCL 1.25 MG/3ML IN NEBU: 1.2500 mg | INHALATION_SOLUTION | Freq: Three times a day (TID) | RESPIRATORY_TRACT | Status: DC

## 2015-05-01 NOTE — Telephone Encounter (Signed)
Spoke with Sharee Pimple at [facility], states that they do not carry xopenex nebs.  Per Sharee Pimple, medicare does not reimburse for this medication, so no DME pharmacy will provide this medication for patients.   lmtcb X1 to verify local pharmacy for this to be sent to.

## 2015-05-01 NOTE — Telephone Encounter (Signed)
lmtcb x1 for pt. 

## 2015-05-01 NOTE — Telephone Encounter (Signed)
Received fax from [facility] denying the appeal for pts her Xopenex nebulizer solution. [place] and was informed that the Xopenex neb solution was run under Medicare Part B (as it should be), but still states it is not covered. The preferred alternatives are Albuterol or Accuneb solution.   Dr. Lenna Gilford please advise. Thanks.   Allergies  Allergen Reactions  . Penicillins     REACTION: nausea and vomiting    Current Outpatient Prescriptions on File Prior to Visit  Medication Sig Dispense Refill  . albuterol (PROVENTIL HFA;VENTOLIN HFA) 108 (90 BASE) MCG/ACT inhaler Inhale 2 puffs into the lungs every 6 (six) hours as needed for wheezing or shortness of breath. 1 Inhaler 6  . allopurinol (ZYLOPRIM) 100 MG tablet Take 1 tablet (100 mg total) by mouth daily. (Patient not taking: Reported on 04/09/2015) 30 tablet 6  . ALPRAZolam (XANAX) 0.5 MG tablet TAKE 1/2 TO 1 TABLET(S) BY MOUTH THREE TIMES DAILY AS NEEDED FOR NERVES. NOT TO EXCEED 3 TABS/DAY. 90 tablet 4  . B Complex-C-Folic Acid (B-COMPLEX BALANCED) TABS Take 1 tablet by mouth daily.    . budesonide-formoterol (SYMBICORT) 160-4.5 MCG/ACT inhaler Inhale 2 puffs into the lungs 2 (two) times daily. 1 Inhaler 6  . cetirizine (ZYRTEC) 10 MG tablet Take 10 mg by mouth daily.      . Cholecalciferol (VITAMIN D-3) 5000 UNITS TABS Take 1 tablet by mouth daily.    . Coconut Oil 1000 MG CAPS Take 4 capsules by mouth daily.    . colchicine 0.6 MG tablet Take 1 tablet (0.6 mg total) by mouth daily. 30 tablet 0  . cyclobenzaprine (FLEXERIL) 10 MG tablet Take 1 tablet (10 mg total) by mouth 3 (three) times daily as needed for muscle spasms. 90 tablet 5  . etodolac (LODINE XL) 400 MG 24 hr tablet Take 1 tablet (400 mg total) by mouth 2 (two) times daily as needed. (Patient not taking: Reported on 04/09/2015) 60 tablet 0  . furosemide (LASIX) 40 MG tablet TAKE 1-2 TABLETS ONCE A DAY AS NEEDED FOR SWELLING 60 tablet 1  . levalbuterol (XOPENEX) 1.25  MG/3ML nebulizer solution Take 1.25 mg by nebulization 3 (three) times daily. (Patient not taking: Reported on 04/09/2015) 270 mL 6  . Magnesium 400 MG CAPS Take 2 capsules by mouth daily.    . methylPREDNISolone (MEDROL) 4 MG tablet TAKE 1 TABLET (4 MG TOTAL) BY MOUTH DAILY. TAKE AS DIRECTED 30 tablet 1  . naproxen sodium (ANAPROX) 220 MG tablet Take 2 tabs by mouth once daily     . sertraline (ZOLOFT) 100 MG tablet TAKE 1/2 TABLET BY MOUTH DAILY 30 tablet 5  . SPIRIVA HANDIHALER 18 MCG inhalation capsule PLACE 1 CAPSULE (18 MCG TOTAL) INTO INHALER AND INHALE DAILY. 30 capsule 5  . traMADol (ULTRAM) 50 MG tablet TAKE 1 TABLET BY MOUTH  3 TIMES DAILY   AS NEEDED FOR PAIN 90 tablet 1  . traZODone (DESYREL) 100 MG tablet Take 0.5-1 tablets (50-100 mg total) by mouth at bedtime as needed for sleep. 30 tablet 0  . vitamin C (ASCORBIC ACID) 250 MG tablet Take 250 mg by mouth daily.     No current facility-administered medications on file prior to visit.

## 2015-05-01 NOTE — Telephone Encounter (Signed)
Per SN: Try getting Xopenex through DME company.   Order placed and rx printed and signed and given to PCC's. Will need to call pt to inform her.

## 2015-05-02 NOTE — Telephone Encounter (Signed)
Pt does not want to take this medication anymore since no one will help her. She don't understand why we ask for a pharmacy every single time. She has done fine without this med since no one will answer a phone call. She don't care if she dies since we don't care.  Per pt- do not bother ever calling her again

## 2015-05-02 NOTE — Telephone Encounter (Signed)
Will not call pt back per her request.

## 2015-05-02 NOTE — Telephone Encounter (Signed)
lmtcb x2 for pt. 

## 2015-05-03 NOTE — Telephone Encounter (Signed)
Patient says that [facility] has control of Medicare until June 2018.   Patient says that she does not have Medicare part D. Patient notified that [facility] has received the prescription and they will contact her to discuss the cost. Nothing further needed.

## 2015-05-04 ENCOUNTER — Other Ambulatory Visit: Payer: Self-pay | Admitting: Pulmonary Disease

## 2015-05-17 ENCOUNTER — Telehealth: Payer: Self-pay | Admitting: Pulmonary Disease

## 2015-05-17 MED ORDER — TRAMADOL HCL 50 MG PO TABS: 50.0000 mg | ORAL_TABLET | Freq: Three times a day (TID) | ORAL | Status: DC | PRN

## 2015-05-17 NOTE — Telephone Encounter (Signed)
Per Dr. Lenna Gilford:  Tramadol 50mg  1 PO TID PRN, #90 RF 5  ------------------- Rx called into pharmacy. Patient aware. Nothing further needed.

## 2015-05-17 NOTE — Telephone Encounter (Signed)
Patient stated that she finished Medrol and is in a lot of joint pain, mostly in hands, wrist and thumb, she does not have any meds to take but oxycodone and doesn't want to take that as makes her itch and constipated. She is requesting Tramadol. [facility] is [name] in [place].  Allergies  Allergen Reactions  . Penicillins     REACTION: nausea and vomiting   Current Outpatient Prescriptions on File Prior to Visit  Medication Sig Dispense Refill  . albuterol (PROVENTIL HFA;VENTOLIN HFA) 108 (90 BASE) MCG/ACT inhaler Inhale 2 puffs into the lungs every 6 (six) hours as needed for wheezing or shortness of breath. 1 Inhaler 6  . allopurinol (ZYLOPRIM) 100 MG tablet Take 1 tablet (100 mg total) by mouth daily. (Patient not taking: Reported on 04/09/2015) 30 tablet 6  . ALPRAZolam (XANAX) 0.5 MG tablet TAKE 1/2 TO 1 TABLET(S) BY MOUTH THREE TIMES DAILY AS NEEDED FOR NERVES. NOT TO EXCEED 3 TABS/DAY. 90 tablet 4  . B Complex-C-Folic Acid (B-COMPLEX BALANCED) TABS Take 1 tablet by mouth daily.    . budesonide-formoterol (SYMBICORT) 160-4.5 MCG/ACT inhaler Inhale 2 puffs into the lungs 2 (two) times daily. 1 Inhaler 6  . cetirizine (ZYRTEC) 10 MG tablet Take 10 mg by mouth daily.      . Cholecalciferol (VITAMIN D-3) 5000 UNITS TABS Take 1 tablet by mouth daily.    . Coconut Oil 1000 MG CAPS Take 4 capsules by mouth daily.    . colchicine 0.6 MG tablet Take 1 tablet (0.6 mg total) by mouth daily. 30 tablet 0  . cyclobenzaprine (FLEXERIL) 10 MG tablet Take 1 tablet (10 mg total) by mouth 3 (three) times daily as needed for muscle spasms. 90 tablet 5  . etodolac (LODINE XL) 400 MG 24 hr tablet Take 1 tablet (400 mg total) by mouth 2 (two) times daily as needed. (Patient not taking: Reported on 04/09/2015) 60 tablet 0  . furosemide (LASIX) 40 MG tablet TAKE 1-2 TABLETS ONCE A DAY AS NEEDED FOR SWELLING 60 tablet 2  . levalbuterol (XOPENEX) 1.25 MG/3ML nebulizer solution Take 1.25 mg by nebulization 3  (three) times daily. 270 mL 11  . Magnesium 400 MG CAPS Take 2 capsules by mouth daily.    . methylPREDNISolone (MEDROL) 4 MG tablet TAKE 1 TABLET (4 MG TOTAL) BY MOUTH DAILY. TAKE AS DIRECTED 30 tablet 1  . naproxen sodium (ANAPROX) 220 MG tablet Take 2 tabs by mouth once daily     . sertraline (ZOLOFT) 100 MG tablet TAKE 1/2 TABLET BY MOUTH DAILY 30 tablet 5  . SPIRIVA HANDIHALER 18 MCG inhalation capsule PLACE 1 CAPSULE (18 MCG TOTAL) INTO INHALER AND INHALE DAILY. 30 capsule 5  . traMADol (ULTRAM) 50 MG tablet TAKE 1 TABLET BY MOUTH  3 TIMES DAILY   AS NEEDED FOR PAIN 90 tablet 1  . traZODone (DESYREL) 100 MG tablet Take 0.5-1 tablets (50-100 mg total) by mouth at bedtime as needed for sleep. 30 tablet 0  . vitamin C (ASCORBIC ACID) 250 MG tablet Take 250 mg by mouth daily.     No current facility-administered medications on file prior to visit.

## 2015-05-21 ENCOUNTER — Other Ambulatory Visit: Payer: Self-pay | Admitting: Pulmonary Disease

## 2015-06-03 ENCOUNTER — Telehealth: Payer: Self-pay | Admitting: Pulmonary Disease

## 2015-06-03 NOTE — Telephone Encounter (Signed)
ATC pt, NA, wcb

## 2015-06-04 MED ORDER — LEVALBUTEROL TARTRATE 45 MCG/ACT IN AERO: 2.0000 | INHALATION_SPRAY | RESPIRATORY_TRACT | Status: DC | PRN

## 2015-06-04 NOTE — Telephone Encounter (Signed)
Called and spoke with pt. Reviewed SN's recs and verified pharmacy as [name] in [place]. I explained to her that I would have SN sign the handicap placard and would place it in the mail. I verified her address in Epic. She voiced understanding and had no further questions. From has been placed in the mail. Nothing further needed.

## 2015-06-04 NOTE — Telephone Encounter (Signed)
Pt returning call - [employer] will only cover Xopenex in the inhaler form not the nebulizer.  Pt is unable to tolerate regular Albuterol Nebulizer meds.  Pt states that she feels some better since completing Prednisone- feels that she still has a lot of "garbage" in her lungs but there is improvement.  Still has SOB constantly but not severe.   Needs a Handicapped placard renewal.   Please advise Dr Lenna Gilford. Thanks.     Medication List       This list is accurate as of: 06/03/15 11:59 PM.  Always use your most recent med list.               allopurinol 100 MG tablet  Commonly known as:  ZYLOPRIM  Take 1 tablet (100 mg total) by mouth daily.     ALPRAZolam 0.5 MG tablet  Commonly known as:  XANAX  TAKE 1/2 TO 1 TABLET(S) BY MOUTH THREE TIMES DAILY AS NEEDED FOR NERVES. NOT TO EXCEED 3 TABS/DAY.     B-COMPLEX BALANCED Tabs  Take 1 tablet by mouth daily.     budesonide-formoterol 160-4.5 MCG/ACT inhaler  Commonly known as:  SYMBICORT  Inhale 2 puffs into the lungs 2 (two) times daily.     cetirizine 10 MG tablet  Commonly known as:  ZYRTEC  Take 10 mg by mouth daily.     Coconut Oil 1000 MG Caps  Take 4 capsules by mouth daily.     colchicine 0.6 MG tablet  Take 1 tablet (0.6 mg total) by mouth daily.     cyclobenzaprine 10 MG tablet  Commonly known as:  FLEXERIL  Take 1 tablet (10 mg total) by mouth 3 (three) times daily as needed for muscle spasms.     etodolac 400 MG 24 hr tablet  Commonly known as:  LODINE XL  Take 1 tablet (400 mg total) by mouth 2 (two) times daily as needed.     furosemide 40 MG tablet  Commonly known as:  LASIX  TAKE 1-2 TABLETS ONCE A DAY AS NEEDED FOR SWELLING     levalbuterol 1.25 MG/3ML nebulizer solution  Commonly known as:  XOPENEX  Take 1.25 mg by nebulization 3 (three) times daily.     Magnesium 400 MG Caps  Take 2 capsules by mouth daily.     methylPREDNISolone 4 MG tablet  Commonly known as:  MEDROL  TAKE 1 TABLET (4 MG TOTAL)  BY MOUTH DAILY. TAKE AS DIRECTED     naproxen sodium 220 MG tablet  Commonly known as:  ANAPROX  Take 2 tabs by mouth once daily     sertraline 100 MG tablet  Commonly known as:  ZOLOFT  TAKE 1/2 TABLET BY MOUTH DAILY     SPIRIVA HANDIHALER 18 MCG inhalation capsule  Generic drug:  tiotropium  PLACE 1 CAPSULE (18 MCG TOTAL) INTO INHALER AND INHALE DAILY.     traMADol 50 MG tablet  Commonly known as:  ULTRAM  TAKE 1 TABLET BY MOUTH  3 TIMES DAILY   AS NEEDED FOR PAIN     traMADol 50 MG tablet  Commonly known as:  ULTRAM  Take 1 tablet (50 mg total) by mouth 3 (three) times daily as needed.     traZODone 100 MG tablet  Commonly known as:  DESYREL  Take 0.5-1 tablets (50-100 mg total) by mouth at bedtime as needed for sleep.     VENTOLIN HFA 108 (90 Base) MCG/ACT inhaler  Generic drug:  albuterol  INHALE  2 PUFFS INTO THE LUNGS EVERY 6 (SIX) HOURS AS NEEDED FOR WHEEZING OR SHORTNESS OF BREATH.     vitamin C 250 MG tablet  Commonly known as:  ASCORBIC ACID  Take 250 mg by mouth daily.     Vitamin D-3 5000 UNITS Tabs  Take 1 tablet by mouth daily.

## 2015-06-04 NOTE — Telephone Encounter (Signed)
Per SN >> She needs to be on the Xopenex! Change to Xopenex HFA 2 puffs q 4 hours.                   Use Mucinex 600mg  2 po BID (to help get the "garbage" out of her lungs)

## 2015-06-04 NOTE — Telephone Encounter (Signed)
LM x 1 for pt 

## 2015-06-13 ENCOUNTER — Telehealth: Payer: Self-pay | Admitting: Pulmonary Disease

## 2015-06-13 DIAGNOSIS — J449 Chronic obstructive pulmonary disease, unspecified: Secondary | ICD-10-CM

## 2015-06-13 MED ORDER — IPRATROPIUM-ALBUTEROL 0.5-2.5 (3) MG/3ML IN SOLN: 3.0000 mL | Freq: Three times a day (TID) | RESPIRATORY_TRACT | Status: DC

## 2015-06-13 NOTE — Addendum Note (Signed)
Addended by: Collier Salina on: 06/13/2015 03:44 PM   Modules accepted: Orders

## 2015-06-13 NOTE — Telephone Encounter (Signed)
Spoke with pt and she states that she is having increased SOB, cough with clear mucus and nasal congestion for several days. Pt has taken Mucinex 2000mg  per day with no improvement. Pt states that she does not have any neb meds as insurance would not cover. She has used her rescue inhaler several times with mild symptom relief but states that it is mostly the nasal/chest congestion that are worst. Pt would like to know what else she can do.   SN please advise.  Allergies  Allergen Reactions  . Penicillins     REACTION: nausea and vomiting   Current Outpatient Prescriptions on File Prior to Visit  Medication Sig Dispense Refill  . allopurinol (ZYLOPRIM) 100 MG tablet Take 1 tablet (100 mg total) by mouth daily. (Patient not taking: Reported on 04/09/2015) 30 tablet 6  . ALPRAZolam (XANAX) 0.5 MG tablet TAKE 1/2 TO 1 TABLET(S) BY MOUTH THREE TIMES DAILY AS NEEDED FOR NERVES. NOT TO EXCEED 3 TABS/DAY. 90 tablet 4  . B Complex-C-Folic Acid (B-COMPLEX BALANCED) TABS Take 1 tablet by mouth daily.    . budesonide-formoterol (SYMBICORT) 160-4.5 MCG/ACT inhaler Inhale 2 puffs into the lungs 2 (two) times daily. 1 Inhaler 6  . cetirizine (ZYRTEC) 10 MG tablet Take 10 mg by mouth daily.      . Cholecalciferol (VITAMIN D-3) 5000 UNITS TABS Take 1 tablet by mouth daily.    . Coconut Oil 1000 MG CAPS Take 4 capsules by mouth daily.    . colchicine 0.6 MG tablet Take 1 tablet (0.6 mg total) by mouth daily. 30 tablet 0  . cyclobenzaprine (FLEXERIL) 10 MG tablet Take 1 tablet (10 mg total) by mouth 3 (three) times daily as needed for muscle spasms. 90 tablet 5  . etodolac (LODINE XL) 400 MG 24 hr tablet Take 1 tablet (400 mg total) by mouth 2 (two) times daily as needed. (Patient not taking: Reported on 04/09/2015) 60 tablet 0  . furosemide (LASIX) 40 MG tablet TAKE 1-2 TABLETS ONCE A DAY AS NEEDED FOR SWELLING 60 tablet 2  . levalbuterol (XOPENEX HFA) 45 MCG/ACT inhaler Inhale 2 puffs into the lungs every 4 (four)  hours as needed for wheezing. 1 Inhaler 5  . levalbuterol (XOPENEX) 1.25 MG/3ML nebulizer solution Take 1.25 mg by nebulization 3 (three) times daily. 270 mL 11  . Magnesium 400 MG CAPS Take 2 capsules by mouth daily.    . methylPREDNISolone (MEDROL) 4 MG tablet TAKE 1 TABLET (4 MG TOTAL) BY MOUTH DAILY. TAKE AS DIRECTED 30 tablet 1  . naproxen sodium (ANAPROX) 220 MG tablet Take 2 tabs by mouth once daily     . sertraline (ZOLOFT) 100 MG tablet TAKE 1/2 TABLET BY MOUTH DAILY 30 tablet 5  . SPIRIVA HANDIHALER 18 MCG inhalation capsule PLACE 1 CAPSULE (18 MCG TOTAL) INTO INHALER AND INHALE DAILY. 30 capsule 5  . traMADol (ULTRAM) 50 MG tablet TAKE 1 TABLET BY MOUTH  3 TIMES DAILY   AS NEEDED FOR PAIN 90 tablet 1  . traMADol (ULTRAM) 50 MG tablet Take 1 tablet (50 mg total) by mouth 3 (three) times daily as needed. 90 tablet 5  . traZODone (DESYREL) 100 MG tablet Take 0.5-1 tablets (50-100 mg total) by mouth at bedtime as needed for sleep. 30 tablet 0  . VENTOLIN HFA 108 (90 Base) MCG/ACT inhaler INHALE 2 PUFFS INTO THE LUNGS EVERY 6 (SIX) HOURS AS NEEDED FOR WHEEZING OR SHORTNESS OF BREATH. 18 g 2  . vitamin C (ASCORBIC ACID)  250 MG tablet Take 250 mg by mouth daily.     No current facility-administered medications on file prior to visit.

## 2015-06-13 NOTE — Telephone Encounter (Signed)
Called and spoke to pt. Pt states she called [facility] and they have not received the order. Advised her that I just placed the order not even 30 min ago and they will need time to process the order and we have to fax the rx to the DME. Advised pt that we can send a short supply to the local pharmacy but it may be more expensive. Pt verbalized understanding and would like [place] to send in Duoneb to [facility] to see if she is able to get the medication now. Rx sent to preferred pharmacy. Pt verbalized understanding and denied any further questions at this time.

## 2015-06-13 NOTE — Telephone Encounter (Signed)
Per SN: Needs neb meds, change Xopenex to Duoneb TID #90/month. Pt is to continue taking the Symbicort BID and the Spiriva daily.   Called and spoke to pt. Informed her of the recs per SN. Rx printed and placed on SN's cart to be signed and to be [facility] to [facility] to [facility] through [facility]. Pt verbalized understanding and denied any further questions at this time.

## 2015-06-20 ENCOUNTER — Telehealth: Payer: Self-pay | Admitting: Pulmonary Disease

## 2015-06-20 MED ORDER — METHYLPREDNISOLONE 4 MG PO TABS: ORAL_TABLET | ORAL | Status: DC

## 2015-06-20 NOTE — Telephone Encounter (Signed)
Called spoke with patient who c/o "Having some real problems breathing" Nebs are breaking up mucus (Duoneb; has taken twice today) and she's coughing up the mucus but feels lungs are filling back up after she coughs it out On 1600mg  of mucinex everyday and bought some expectorant cough syrup Mucus is pearly in color, sometimes clear When she is trying to expel the mucus, feels some wheezing Been off prednisone for a little over a month, and doesn't really want to go back on it but states she 'has to get back to living her life"  [name] in [place]  Allergen Reactions  . Penicillins     REACTION: nausea and vomiting   Current Outpatient Prescriptions on File Prior to Visit  Medication Sig Dispense Refill  . allopurinol (ZYLOPRIM) 100 MG tablet Take 1 tablet (100 mg total) by mouth daily. (Patient not taking: Reported on 04/09/2015) 30 tablet 6  . ALPRAZolam (XANAX) 0.5 MG tablet TAKE 1/2 TO 1 TABLET(S) BY MOUTH THREE TIMES DAILY AS NEEDED FOR NERVES. NOT TO EXCEED 3 TABS/DAY. 90 tablet 4  . B Complex-C-Folic Acid (B-COMPLEX BALANCED) TABS Take 1 tablet by mouth daily.    . budesonide-formoterol (SYMBICORT) 160-4.5 MCG/ACT inhaler Inhale 2 puffs into the lungs 2 (two) times daily. 1 Inhaler 6  . cetirizine (ZYRTEC) 10 MG tablet Take 10 mg by mouth daily.      . Cholecalciferol (VITAMIN D-3) 5000 UNITS TABS Take 1 tablet by mouth daily.    . Coconut Oil 1000 MG CAPS Take 4 capsules by mouth daily.    . colchicine 0.6 MG tablet Take 1 tablet (0.6 mg total) by mouth daily. 30 tablet 0  . cyclobenzaprine (FLEXERIL) 10 MG tablet Take 1 tablet (10 mg total) by mouth 3 (three) times daily as needed for muscle spasms. 90 tablet 5  . etodolac (LODINE XL) 400 MG 24 hr tablet Take 1 tablet (400 mg total) by mouth 2 (two) times daily as needed. (Patient not taking: Reported on 04/09/2015) 60 tablet 0  . furosemide (LASIX) 40 MG tablet TAKE 1-2 TABLETS ONCE A DAY AS NEEDED FOR SWELLING  60 tablet 2  . ipratropium-albuterol (DUONEB) 0.5-2.5 (3) MG/3ML SOLN Take 3 mLs by nebulization 3 (three) times daily. 360 mL 5  . levalbuterol (XOPENEX HFA) 45 MCG/ACT inhaler Inhale 2 puffs into the lungs every 4 (four) hours as needed for wheezing. 1 Inhaler 5  . levalbuterol (XOPENEX) 1.25 MG/3ML nebulizer solution Take 1.25 mg by nebulization 3 (three) times daily. 270 mL 11  . Magnesium 400 MG CAPS Take 2 capsules by mouth daily.    . methylPREDNISolone (MEDROL) 4 MG tablet TAKE 1 TABLET (4 MG TOTAL) BY MOUTH DAILY. TAKE AS DIRECTED 30 tablet 1  . naproxen sodium (ANAPROX) 220 MG tablet Take 2 tabs by mouth once daily     . sertraline (ZOLOFT) 100 MG tablet TAKE 1/2 TABLET BY MOUTH DAILY 30 tablet 5  . SPIRIVA HANDIHALER 18 MCG inhalation capsule PLACE 1 CAPSULE (18 MCG TOTAL) INTO INHALER AND INHALE DAILY. 30 capsule 5  . traMADol (ULTRAM) 50 MG tablet TAKE 1 TABLET BY MOUTH  3 TIMES DAILY   AS NEEDED FOR PAIN 90 tablet 1  . traMADol (ULTRAM) 50 MG tablet Take 1 tablet (50 mg total) by mouth 3 (three) times daily as needed. 90 tablet 5  . traZODone (DESYREL) 100 MG tablet Take 0.5-1 tablets (50-100 mg total) by mouth at bedtime as needed for sleep. 30 tablet 0  .  VENTOLIN HFA 108 (90 Base) MCG/ACT inhaler INHALE 2 PUFFS INTO THE LUNGS EVERY 6 (SIX) HOURS AS NEEDED FOR WHEEZING OR SHORTNESS OF BREATH. 18 g 2  . vitamin C (ASCORBIC ACID) 250 MG tablet Take 250 mg by mouth daily.     No current facility-administered medications on file prior to visit.

## 2015-06-20 NOTE — Telephone Encounter (Signed)
Per SN: she was supposed to still be on Medrol 4mg  tabs 1daily, then alternating 1 and 1/2 every other day and if she was doing really well then alternating 1 tabs and 0 tabs every other day.  What happened???  I recommend restarting Medrol 4mg  1 po BID till return office visit on 5.9.17, #60.  Thanks.  Called spoke with patient who reported that she stopped the Medrol because she felt like she was doing so well and also because of the weight gain side effects.  However, pt is okay with restarting this medication and is aware to take it twice daily until she is seen back in the office on 5.9.17.  Rx sent to verified pharmacy.  Pt is aware to contact the office if her symptoms do not improve before the weekend.  Nothing further needed; will sign off.

## 2015-06-25 ENCOUNTER — Ambulatory Visit: Payer: Self-pay | Admitting: Pulmonary Disease

## 2015-06-25 ENCOUNTER — Telehealth: Payer: Self-pay | Admitting: Pulmonary Disease

## 2015-06-25 MED ORDER — GUAIFENESIN-CODEINE 100-10 MG/5ML PO SYRP: 5.0000 mL | ORAL_SOLUTION | ORAL | Status: DC | PRN

## 2015-06-25 NOTE — Telephone Encounter (Signed)
Pt states that she is still having problems with her cough. Pt states that she is coughing up a lot of clear mucus, cloudy at times. Pt taking Medrol as directed. Pt unable to sleep d/t coughing.Feels that her throat is closing up and she is unable to get oxygen into her lungs and she has to sit up and to catch her breath. Pt states that last night she was up and down all night d/t cough and had to get up at 2:30 and just stay upright the rest of the night. Pt coughing while on the phone and you can hear the gurgling of mucus and she states that she just coughed up a ton of mucus. Pt has not had a recent cxr. And is requesting to be seen if needed. Pt reports feeling exhausted d/t not sleeping and coughing 24/7.  Pt added on with Dr Halford Chessman today at [facility]   Will send to [facility] as FYI. Nothing further needed.

## 2015-06-25 NOTE — Telephone Encounter (Addendum)
Per SN: okay to call in Cheritussin for her 1tsp every 4 hours as needed.  Stop the sudafed, it will keep her awake.  Ok to take claritin in addition to the zyrtec.  Mucinex is BID so it should be taken with breakfast and dinner, not bedtime.  Nebs are QID, adjust the times so she doesn't take the last dose right before bed.  Called spoke with patient and discussed the above with her.  Pt okay with these recommendations and voiced her understanding.  Rx telephoned to her verified pharmacy > spoke with pharmacist Fara Olden.  Pt is aware to contact the office if her symptoms do not improve or they worsen and to keep her 5.9.17 ov w/ SN.  Nothing further needed; will sign off.

## 2015-06-25 NOTE — Telephone Encounter (Signed)
Per SN: she is on Medrol 4mg  BID and Mucinex 1200 BID w/ fluids.  Ask her if she wants cough syrup called in >> Hycodan #1 pint, 1 tsp every 6 hours as needed for cough.  Called spoke with patient who reported that she would like to cancel the appt with [facility] but cannot afford a cough medication (she had already checked with her pharmacy and the cheapest cough syrup would be Cheritussin at $20).  Pt is now wondering if changing some of her medications would help: 1. She has been taking her nebs and mucinex right before bed.  Could this be affecting her mucus production during the night since the nebs/mucinex loosen the mucus? 2. She also wonders if this could be from allergies?  She had tried sudafed for her head congestion and clear mucus but couldn't tell much difference. 3. She's been taking Zyrtec at bedtime.  Would adding another anti-histamine help?  Maybe Claritin in the morning?  Her sats are good - she was 93% room air sitting while talking with me. Had been taking her Medrol 4mg  BID and the second dose was keeping her up at night, but recently changed this to both tabs in the morning.  Dr Lenna Gilford please advise, thank you.

## 2015-06-25 NOTE — Addendum Note (Signed)
Addended by: Parke Poisson E on: 06/25/2015 05:09 PM   Modules accepted: Orders

## 2015-07-03 ENCOUNTER — Other Ambulatory Visit: Payer: Self-pay | Admitting: Pulmonary Disease

## 2015-07-03 ENCOUNTER — Telehealth: Payer: Self-pay | Admitting: Pulmonary Disease

## 2015-07-03 MED ORDER — IPRATROPIUM-ALBUTEROL 0.5-2.5 (3) MG/3ML IN SOLN: 3.0000 mL | Freq: Three times a day (TID) | RESPIRATORY_TRACT | Status: DC

## 2015-07-03 NOTE — Telephone Encounter (Signed)
Called and spoke to pt. Pt is requesting Duoneb be sent to [facility] instead of [facility] pharmacy. Rx sent to preferred pharmacy. Pt verbalized understanding and denied any further questions or concerns at this time.

## 2015-07-09 ENCOUNTER — Ambulatory Visit (INDEPENDENT_AMBULATORY_CARE_PROVIDER_SITE_OTHER): Payer: Medicare Other | Admitting: Pulmonary Disease

## 2015-07-09 ENCOUNTER — Encounter: Payer: Self-pay | Admitting: Pulmonary Disease

## 2015-07-09 VITALS — BP 126/86 | HR 95 | Ht 65.0 in | Wt 267.0 lb

## 2015-07-09 DIAGNOSIS — J449 Chronic obstructive pulmonary disease, unspecified: Secondary | ICD-10-CM

## 2015-07-09 DIAGNOSIS — G4733 Obstructive sleep apnea (adult) (pediatric): Secondary | ICD-10-CM

## 2015-07-09 DIAGNOSIS — E039 Hypothyroidism, unspecified: Secondary | ICD-10-CM

## 2015-07-09 DIAGNOSIS — R7302 Impaired glucose tolerance (oral): Secondary | ICD-10-CM

## 2015-07-09 DIAGNOSIS — J9611 Chronic respiratory failure with hypoxia: Secondary | ICD-10-CM

## 2015-07-09 NOTE — Progress Notes (Signed)
HPI  Review of Systems  Physical Exam  Patient ID: Rebecca [name], female   DOB: 01-19-1951, 65 y.o.   MRN: [id number] Subjective:   HPI 65 y/o WF known to me w/ mult med problems as noted below...  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  LABS 3/15:  Chems- BS=95 A1c=6.8 (needs low carb diet, exercise, wt loss) & Creat=1.5 (mild renal insuffic due to Lasix rx; Rec to decrease Lasix40 to 1 tab Qam...  2DEcho 4/15 showed norm LV sys function w/ EF=60-65%, normal wall motion, Gr1DD, normal valves, normal RV & PA pressures...   she had Cards eval [name] 4/15> he did not feel that invasive testing was warranted; she had 2DEcho but refused Myoview due to co-pay costs...   She remains on ADVAIR500Bid, SPIRIVA daily, & ProairHFA prn (uses 3-4 x daily "It really helps"); has Home O2 but dislikes the Liq O2 therefore not using & she wants a portable O2 concentrator to read 4L/min w/ exercise, 2L/min at rest.   She has continued to research her supplements and herbal meds>  She tells me that she is living on smoothies she makes out of juice, ice, baking soda, pomegranate syrup, & local honey;  Tumeric helps the arthritis in her knees and she notes that pinching her upper lip in the middle under her nose helps relieve musc cramps in her legs.  Ambulatory O2 Test 9/15>  O2 sat on RA at rest= 92% w/ pulse=86;  Nadir O2sat on RA after 2 laps= 84% w/ pulse=96...   CXR 9/15 showed COPD/emphysema, no acute abnormality...  LABS 9/15:  Chems- ok w/ BS=119, A1c=6.7, Cr=1.4;  CBC- ok...  ~  May 02, 2014:  2-54moROV & post hosp check>  [facility]/ COPD exac [place]  Disch on Levaquin750, Pred taper, NEBS w/ Xopenex, and continued on her Advair500Bid, Spiriva daily, Mucinex, Lasix40 prn >>    2/16 [facility] records indicated a thorough evaluation & excellent care provided but treatment plan was hampered by noncompliance- refused CPAP, declines chestPT, alternately stopping her O2 & demanding more O2,   LABS 2/16:  ABG- pH=7.46, pCO2=35, pO2=134 on 6L/min; Chems- ok x BS=169, A1c=6.5; CBC- normal; TSH=0.39 & FreeT4=1.54; Troponin/ D-dimer/ BNP- all wnl...  CXR 2/16 showed heart at upper lim of norm, hyperinflation, min bibasilar atx, NAD..Marland KitchenMarland Kitchen  EKG 2/16 showed NSR, rate88, low voltage, otherw wnl...  2DEcho 2/16 showed norm LVF w/ EF=55-60%, Gr1DD, norm valves and norm RV...  We reviewed the following medical problems during today's office visit >>     Hx mild OSA w/ mod desat, loud snoring, & leg jerks (on sleep study 2005)> she tried CPAP10 but stopped on her own & declined to repeat split night study & re-try CPAP rx; she says she's sleeping satis & wakes feeling refreshed...    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 1993; supposed to be on HomeO2- 2L rest & 4L exercise, NEBS w/ AlbutQid, Advair500Bid, Spiriva daily, Mucinex1-2Bid but not taking anything regularly; states that Flutter & Mucinex cause "spasms" in her back; states O2 doesn't help since she can't breathe thru her nose (she has been eval by ENT, CErnesto Rutherford; finally tried [facility] after [facility] "fruitless"; prev stated she "can't breathe at all, can't do anything" etc; SOB  w/ ADLs etc; Note> she was eval at [facility] [place] in 2006 and last PFT (2011) showed FEV1= 1.5 (55%) & %1sec=59 ==> 3/15 she announced that she's been cured by taking a Magnesium supplement, along w/ herbal formula "Clear lungs" and "Lung tonic"; she had stopped her Oxygen;  Now using O2 up to 15L/min on her own since she can't breathe thru her nose- advised 2L/min rest & 4L/min exercise + humidity, nasal hygiene, etc;  Keep Pred 26m/d for now & ROV 3wks...    Hx AtypCP> baseline EKG w/ minor NSSTTWA; 2DEcho w/ norm LVF, EF=55-60%, Gr1DD, norm valves, norm  LA/RV; Myoview 2009 normal w/o ischemia & EF=55%; she notes some atypCP & saw DCherly Hensen4/15, she declined Myoview.    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; she knows not to eat sodium, elev legs, wear support hose; Labs 2/16 showed Cr=1.2-3    Impaired Gluc Tolerance, Metabolic syndrome> she refused meds; prev eval by DrEllison w/ rec for Metformin Rx but she rejects the DM diagnosis & refused meds; states "I'm eating healthier" (advised low carb low fat); Labs 2/16 showed BS~160, A1c=6.5..Marland KitchenMarland Kitchen   ?Hypothyroid> not currently on meds but she really wants thyroid Rx; prev TFTs all wnl; she prev had Endocrine in [place] prescribe Synthroid for her but she stopped going; clinically & biochemically euthyroid; Labs 2/16 showed TSH=0.39 & FreeT4=1.54    Morbid Obesity> peak weight ~340# before lap band surg 2009 by DrMMartin; lost to ~250# but then back up to 280-90#; refused f/u w/ [facility] due to cost of visits & lap-band adjustments; 3/16 weight isdown to 255# & we reviewed diet & exercise...    GI- GERD, IBS> she uses OTC PPI as needed; last colonoscopy was 1982 by DrPatterson & wnl, she refuses GI referral & refuses f/u colon etc...    GYN- s/p uterine cancer> s/[facility]    DJD, ?FM, VitD defic> prev on Vicodin, Tramadol, Aleve, VitD; prev eval by DrDeveshwar for rheum; now she states that [name] ("horse linament") applied topically has worked like a miracle & she has increased exercise etc...    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 PLAN>>  Asked to continue Pred20/d for now, Advair500Bid, Spiriva daily, NEBS w/ Xopenex Bid-Tid, Mucinex600-2Bid, fluids; we will ask DME for humidity on her O2 & switch to pulse dose where able; she is c/o nasal dryness & we reviewed nasal regimen/hygiene; we  offered second/third opinions (eg-DrKozlow), offered Hospice referral, but we ultimately decided on [facility]... ROV in 3 wks... Note> [facility] & [facility]; Hospice required home-bound status and an [place]; [facility] ?unable to set up humidity & pulse-dose oxygen...   ~  May 21, 2014:  3wk ROV & recheck> Rebecca Delaypent most of this 2058mROV ventilating about her frustration over [facility], her oxygen, the Prednisone, her sister, and life in general; In the interval she developed "ROID RAGE" because of her Pred Rx at 81m74m her breathing was better but she flew into a rage & discussed this w/ her psychologist, [name]- then called us &[place]as instructed to wean down the Pred; now on 10mg50mnd she is much better but she states "I want off" and we outlined a  slow weaning schedule for her over the next month;  She also [name] an interval trip to the ER w/ constipation...     OSA> she has declined a repeat sleep study, using O2 at night- 2L/min by [place]...    COPD/ Emphysema> on Pred taper (now 35m/d), Advair500Bid, Spiriva daily, Xopenex NEBS Tid, Ventolin HFA prn; based on PFTs in 2011 she had GOLD Stage 2 COPD, patient group B, but has clearly deteriorated over the last few yrs; [place] and she appears improved at present but declines to do f/u PFT at this time; she is very frustrated w/ [facility]- we have tried to get humidifiers set up & pulse-dose apparatus on her portable equipment (OK to adjust pulse-dose O2 to 3L/min rest & 5L/min exercise)...  We reviewed prob list, meds, xrays and labs> see below for updates >>  PLAN>> We will again try to get [facility] set up her humidity & pulse-dose oxygen (OK'd for 3-5L/min flow);  Wean Pred according to slow taper printed on the AVS; ROV in 6wks w/ PFT...  ~  June 05, 2014:  2wk ROV & add-on for SOB> GArtis Delayis always SOB due to her severe GOLD  stage 3-4 COPD and her CC is "my life is miserable, I hate my life" she is angry at the world- her family (esp sister), [employer], Medicare, all of her doctors, etc... Today she focuses on not being able to breathe thru her nose & reminds me that [name] wanted to operate but I assessed her operative risk too high; says she can't use her oxygen due to this issue & [facility] has been unable to satisfy her needs in this area (she states that shje has to wait another 180moefore Medicare will permit changing DME providers)... She is quite tearful but still able to talk w/ loud volume and full sentences;  She has mild cough, sm amt beige sput, no hemoptysis, chr stable/ severe SOB/DOE w/ ADLs and any activity, difficulty caring for her pets...     OSA> she has declined a repeat sleep study, using O2 at night- 2L/min by [place], occas puts it in her mouth...    COPD/ Emphysema> on Pred taper (now 1039m), Advair500Bid, Spiriva daily, Xopenex NEBS Tid, Ventolin HFA prn; based on PFTs in 2011 she had GOLD Stage 2 COPD, patient group B, but has clearly deteriorated over the last few yrs; [place] noted and she appears to be stable at present; she agreed to repeat Spirometry today> c/w GOLD Stage 3-4 COPD (see below)... EXAM shows Afeb, VSS, O2sat=90% on RA in office;  HEENT- nasal congestion, erythema, turbinate hypertrophy, dev septum, throat= Mallampati 2, sl red, no exudates or lesions;  Chest- decr BS bilat w/o w/r/r & no signs of consolidation;  Ext- VI w/ trace edema;  Psyche- very distraught...  Last CXR 2/16 showed heart at upper lim of norm, hyperinflation, min bibasilar atx, NAD  Spirometry 06/2014 showed FVC=1.61 (50%), FEV1=0.88 (35%), %1sec=55, mi-flows are reduced to 18% predicted... 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 PLAN>>  We discussed referral to medical center to consider ENT eval to help her nasal obstruction, & eval by pulmonary team to see if they can think  of anything else to help this nice lady...  ~  Jul 02, 2014:  33mo36mo & Rebecca [name] good days and bad- today is a good day & she thinks maybe because she didn't take Xanax last  PM (she will investigate);  She was [facility] by [facility]NT at [facility] & they are hoping NOT to have to do surg, now on nasal srays + Saline & an ointment, they removed ear wax & plan hearing eval soon;  She is using a new head-set w/ microphone-like oxygen delivery device instead of nasal cannula;  She notes some back spasms and using her Flexeril;  She brought a copy of COPD [employer] & an article that states if a Medicare pt requires a different O2 delivery system, then the DME must comply- she is unable to use the mult portable tanks due to weight, awkward maneuvering, can't manipulate the controls, etc=> we will request an [employer] ONE portable concentrator for her...    She remains on Home O2 at 2L/min, Advair500Bid, Spiriva daily, Xopenex NEBS Tid, Ventolin HFA prn;  Chest is clear w/ decr BS bilat, no w/r/r;  Heart shows RR, no m/r/g... We reviewed prob list, meds, xrays and labs> see below for updates >>  PLAN>>  We will request the port oxygen concentrator for her use=> letter written & sent to [facility];  She will f/u w/ ENT & has Pulm 2nd opinion at [facility] in June- ROV in 2mo..  ~  Jul 16, 2014:  2wk ROV & add-on appt requested for mult issues, here w/ her friend PVickii Chafetoday... States she's been more SOB for the past week w/ thick mucus, left ankle pain & she goes into great detail about prev ankle sprain yrs ago, but [name] Dx prob gout & rec Ibuprofen/ Indocin/ Colchicine but it's still sore w/ decr ROM- I told her we would need to send her to Ortho if Depo120, MEDROL taper (she won't take Pred due to "rage") didn't resolve the issue... She states that 2wks ago her breathing was the best it's been in years! But now c/o cough, congestion, incr SOB "it's so bad", says she's desat at home & increased her O2 to 8L/min but ran out of tanks fast! She's  taking OTC Magnesium supplements; O2sat here is 93% on RA!!!  [facility] and has appt for pulmonary 2nd opinion in June... She tells me about a posting on Facebook about "6 deadly antibiotics" & Levaquin was one of them...     We reviewed prob list, meds, xrays and labs> see below for updates >> she wants a letter so the post office will bring mail to her door...  LABS 5/16:  Chems- wnl w/ BS=90, Cr=1.02;  Mg=2.2, Ca=9.0;  Uric=6.6..Marland KitchenMarland KitchenPlan>>  We discussed Rx w/ Depo120, Medrol854m tapering sched, and to continue regular dosing w/ Home O2 at 2-3L/min, Advair500Bid, Spiriva daily, Xopenex NEBS Tid, Ventolin HFA prn, & Guaifenesin...   ~  September 04, 2014:  6wk ROV & Rebecca [facility]as [facility]      [facility]> HxCOPD w/ revers component and chr nasal obstruction L>R, nasal crusting, bilat inferior nasal turbinate hypertrophy, etc;  They rec nasal saline, nasal steroid spray, mupirocin ointment, and breathe right nasal strips; they will consider surg if not improved...       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 CTChest 08/21/14 showed severe centrilobular emphysema, diffuse bronchial thickening, mild mucus plugging, no adenopathy, no lung nodules etc; also showed mild Ao & coronary calcif, lap band at GE junction, splenosis, DJD  Tspine... PLAN> They decided to slowly wean the Medrol, continue Advair & wean to Advair250Bid, continue Spiriva, continue NEBS & AlbutHFA; rec to continue her oxygen (but she continues to use it "prn"), consider repeat sleep study & CPAP titration, consider referral to [place] rehab, referred for nutritional counseling & ROV 58mo..      She [name] breathing well recently, she has a new roommate, eating out a lot, went to "theclub", then one day over the weekend=> incr SOB, she's been taking Lasix80 Qam, rambling hx w/ flight of ideas regarding coffee, caffeine, not urinating enough, she had to use [facility] transport [place];  They decreased her Advair250-Bid, Medrol654md, continue Nebs (she's using Bid because she "cramps up" if she does it tid); she is awaiting nutritional counseling, pulm rehab referral, oxygen titration testing, sleep study...  We reviewed prob list, meds, xrays and labs> see below for updates >>  PLAN>>  She will maintain close contact & f[facility]/ [facility];  Reminded to avoid sodium & carbs- handout given; plan ROV here in 2-65m67mo  ~  December 05, 2014:  65mo28mo & Rebecca [name]'s new PCP is in [place]> she persists w/ 4+somatic complaints and signif health related anxiety; [facility] "they kicked me out due to CP- I can't go back until I get this evaluated"; she's been seeing Dr. SaraGermain Osgoodlm Fellow [facility]/ [facility] & I have reviewed all the Care Everywhere notes-- several telephone encounters including their last 45 min conversation well documented, not much reversibility, they hoped for small improvement w/ Rehab exercise & staying on meds but she won't fill rx, can't afford, tearful/ frustrated/ depressed but refused psyche consult etc... Pt indicates that DrStephens said there was nothing wrong w/ her heart & pt doesn't want cardiac eval; DrStephens wants her to wean the Medrol but she's breathing  better on 4mg/66mshe wants 6mg/d36mrec to compromise w/ 6mg al39m/ 4mg Qod91mhe wants a liq oxygen system again- she is quite adamant (being a former "nurse")Insurance claims handlershe needs 4-5L/min when active and tank only lasts 1-2H; she uses 4L/min Qhs as well; but O2sat=92% on RA at rest today; she refuses to go back to rehab, therefore encouraged to do it daily on her own; she has not pursued further bariatric considerations- she is working w/ Drlori's office regarding eating disorder "he doesn't think I'm depressed"...      IMP/PLAN>>  Rebecca [name] has Artis Delayere airflow obstruction/ emphysema w/ little reversibility; Rec to take SYMBICORT160-2spBid & Spiriva daily; use Medrol 4mg tabs8mmg alt w665mmg Qod; X76mnex NEBS Tid;  Alprax 0.5mg - 1/2 t28m tab tid; ROV w/ me in 76mo...  ~  N89mober 7, 2016:  76mo ROV & pt 77momult complaints> head "stopped-up", ears congested, tried pseudophed but "it made me sick", doesn't want ENT f/u, rec to try Saline nasal mist & phenylephrine decongestant;  She is  discouraged by her weight, can't seem to lose any, she described in detail her meals from yest (we discussed calories "in" & "out", eat less, burn more), she says local Gym won't take her w/ oxygen;  She asked about Stem Cell treatment for COPD- I offered to send her to [facility] to inquire about this or lung transplant;  She is very frustrated- eating is an issue because when she fills up she can't breathe well due to her prev LapBand surg she says, if she belches she feels better...       Rec to continue Medrol39m- she incr herself to 664md, Symbicort160-2spBid, Spiriva daily, Xopenex NEBS Tid (only doing it once/d), Guaifenesin400- taking 6/d w/ fluids, O2- she is not using it at rest, using 5L/min w/ exercise; she takes Lasix40, Tamadol50, & Xanax0.5 as needed...   ~  April 09, 2015:  25m70moV & Rebecca [name] continues to complain about ever worsening breathing- DOE, dry cough, chest tightness, leg cramps, difficulty sleeping; notes good days and bad-  "I got worked up over the election" she says; she thinks the zoloft is helping but she can't sleep w/ it- rec to try TRAZODONE instead (100m25m/2 to 1 tab Qhs);  Currently taking Oxygen that she adjusts herself, Medrol4mg/60mNEBS w/ Xopenex Tid (but she says insurance won't cover it), Symbicort160-2spBid, Spiriva daily, Lasix40 (just taking prn)... I suggested that she try the Oximizer=> given to pt to try...    See 08/2014 note above for summary [facility]  [facility]> reviewed in [place] w/ NSR, wnl, NAD; prev 2DEcho 04/2014 showed norm LVF w/ EF=55-60%, norm wall motion, G1DD, RVF was also wnl; she had some chest discomfort [facility] recommended Myoview but pt never followed thru due to dyspnea & O2 requirements; they also considered [facility] but she never returned as requested...    She has seen DrZSmith for SportsMed/ PM&R> c/o back pain & leg pain, his note is reviewed... EXAM shows Afeb, VSS, O2sat=91% on RA in office;  HEENT- nasal congestion, erythema, turbinate hypertrophy, dev septum, throat= Mallampati 2, sl red, no exudates or lesions;  Chest- decr BS bilat w/o w/r/r & no signs of consolidation;  Heart- RR w/o m/r/g;  Abd- Obese, panniculus, soft/ nontender;  Ext- VI w/ trace edema;  Psyche- very distraught...  Ambulatory Oximetry on 3L/min [place]>  O2sat=96% on 3L/min at rest w/ HR=107/min;  She ambulated 1 Lap on 3L/min w/ nadir O2sat=89% w/ HR=125/min...  Ambulatory Oximetry on 3L/min w/ pendant oxymizer>  O2sat=99% on 3L/min at rest w/ pulse=98/min;  She ambulated one lap & stopped due to dyspnea w/ nadir O2sat=91% w/ HR=123/min... IMP/PLAN>>  She will try the Oximizer at home, switch from zoloft to TRAZODONE 100mg-59m to 1 tab Qhs, try to wean MEDROL 4mg is54mle to 4mg Qod18m increments; rov in 3 months time...  ~  Jul 09, 2015:  25mo ROV 37mol continues to complain about everything- productive cough w/ lots of clear sput,  chr stable DOE w/ min activ, denies CP- says she's doing well w/ her NEB Rx;  After he last visit w/ prescribed an Oximizer but she says "no help at all"; we tried Trazodone100 Qhs for sleep but she tells me she didn't need it; we discussed weaning down the dose of Medrol (she was on 8mg/d) an77mhe was able to diminish this to 4mgQod but51mmptoms flaired & she went right  back up to 59m Qam again; she is taking gen Guaifenesin 4037mtabs- 8/d in divided doses + one gal fluids daily- this helps her cough & congestion;  Offered referral to [facility] for a 3rd opinion regarding her COPD and poss transplant eval but says she was told she had to lose 100# first ...  She wants a thorough THYROID & pituitary eval w/ "reverse T3" checked=> we will refer to Endocrine;  She says her hands are cramping- asked to try soaking hands in hot water but she has several reasons why she can't get to the sink...     Hx mild OSA w/ mod desat, loud snoring, & leg jerks (on sleep study 2005)> she tried CPAP10 but stopped on her own & declined to repeat split night study & re-try CPAP rx; she says she's sleeping satis & wakes feeling refreshed...    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 1993; supposed to be on HomeO2- 2L rest & 4L exercise, NEBS w/ Duoneb vs Xopenex Tid, Symbicort160-2spBid, Spiriva daily, Mucinex1-2Bid but not taking anything regularly; states that Flutter causes "spasms" in her back; states O2 doesn't help since she can't breathe thru her nose (she has been eval by ENT, CrErnesto Rutherford [facility] tried [facility] after 37m83mo "fruitless"; prev stated she "can't breathe at all, can't do anything" etc; SOB w/ ADLs etc; Note> [facility] [place]- last PFT (2011) showed FEV1= 1.5 (55%) & %1sec=59 ==> 3/15 she announced that she's been cured by taking a Magnesium supplement, along w/ herbal formula "Clear lungs" and "Lung tonic"; she had stopped her Oxygen;  Now using O2 up to 15L/min on her own since  she can't breathe thru her nose- advised 2L/min rest & 4L/min exercise + humidity, nasal hygiene, etc; on MEDROL4-8mg3m    Hx AtypCP> baseline EKG w/ minor NSSTTWA; 2DEcho w/ norm LVF, EF=55-60%, Gr1DD, norm valves, norm LA/RV; Myoview 2009 normal w/o ischemia & EF=55%; she notes some atypCP & saw DrNiCherly Hensen5, she declined Myoview; [facility] in [facility] in 2016 until she'd had cardiac eval- she declined...    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  Marland KitchenMarland Kitchen?Hypothyroid> not currently on meds but she really wants thyroid Rx; prev TFTs all wnl; she prev had Endocrine in [place] prescribe Synthroid for her but she stopped going; clinically & biochemically euthyroid; Labs 2/16 showed TSH=0.39 & FreeT4=1.54    Morbid Obesity> peak wt~340# before lap band surg 2009 by DrMMartin; lost to ~250# but then back up to 280-90#; refused f/u w/ [facility] due to cost of visits & lap-band adjustments; we reviewed diet & exercise- Wt betw 255-265#in last yr.    GI- GERD, IBS> she uses OTC PPI as needed; last colonoscopy was 1982 by DrPatterson & wnl, she refuses GI referral & refuses f/u colon etc...    GYN- s/p uterine cancer> [facility]   DJD, ?FM, VitD defic> prev on Vicodin, Tramadol, Aleve, VitD; prev eval by DrDeveshwar for Rheum; now she states that [name] ("horse linament") applied topically has worked like a miracle & she has  increased exercise etc...    Anxiety/ Depression> on Alprazolam 0.38m prn; prev eval by Psychiatry w/ seasonal affective disorder; still sees Psychology [name] re: eating disorder; under considerable stress due to  relationship w/ sister, financial pressure, etc... EXAM shows   SHE NEEDS F/U LABS IMP/PLAN>>  GArtis Delaywants an Endocrine eval & we will refer to endocrinologist;  Needs f/u labs but she wants the endocrine eval;  Advised to try to wean the Medrol to 41mQam;  She must lose the weight & then reassess & consider referral to [facility];  We [plan ROV in 44m62moeminded to bring al mmed bottles to every office visit.           Problem List:    OBSTRUCTIVE SLEEP APNEA (ICD-327.23) - sleep study 2005 showed RDI=15 w/ desat to 78%, loud snoring & +leg jerks> on CPAP 10, prev used intermittently but now not at all... ~  11/12:  She has agreed to a new Sleep Study- split night protocol, to recheck her OSA & determine CPAP needs (check ABG if poss too)==> she never followed thru w/ the study. ~  Pt states that she is resting satis and wakes refreshed, energy better... ~  12/15: she [name] not resting due to left shoulder/ arm pain & we will refer to Ortho (she saw Dr. ZacGardenia Phlegm. ~  She continues to refuse repeat sleep study, CPAP, and declines f/u w/ sleep med...  [facility] & [facility]> he is working to get her nares open to facilitate her breathing & nasal O2 Rx...  ~  Spring 2016> ENT eval by DrPlonk & his notes are reviewed> HxCOPD w/ revers component and chr nasal obstruction L>R, nasal crusting, bilat inferior nasal turbinate hypertrophy, etc;  They rec nasal saline, nasal steroid spray, mupirocin ointment, and breathe right nasal strips; they will consider surg if not improved...   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 supposed to be on NEBULIZER Qid, ADVAIR 500Bid + SPIRIVA daily (compliance is a serious issue- she freq stops all meds) ==> [facility]... ~  A1AT level is normal 218 (83-200) 3/09... ~  baseline CXR w/ borderline Cor, COPD, interstitial scarring/ atelectasis/  obesity... ~  PFTs 3/07 showed FVC=2.74 (82%), FEV1=1.67 (62%), %1sec=61, mid-flows=27%pred... improved from 2005. ~  CTChest in 2007 & 4/09 showed severe centrilob emyphysema, + interstitial fibrosis, sm HH, left renal cyst... neg for PE... ~  2010:  breathing improved w/ weight reduction after Lap-band surg... ~  2/11: COPD exac w/ neg CXR (chr changes, NAD), Rx w/ Depo/ Pred/ Avelox/ Mucinex/ NEBS/ Advair/ Spiriva/ etc... ~  PFTs 3/11 showed FVC= 2.57 (73%), FEV1= 1.51 (55%), %1sec=59, mid-flows= 21%pred. ~  Noncompliant w/ Rx & O2 thru 2012... On disability since 2011 & finally got Medicare coverage 9/12... ~  11/12: presents w/ worsening breathing & dyspnea w/ ADLs ==> we outlined further eval w/ 2DEcho, Sleep Study; Rx w/ NEBS QID, Advair500Bid, Spiriva daily, Mucinex, Oxygen, Lasix, Zoloft, Xanax, etc (she declined to proceed w/ the sleep study, 2DEcho, etc)... 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  ~  4/13: she is c/o the nasal O2 not helping since it's hard to breathe thru nose & wants ENT referral==> saw [name] who wanted to do surg but she has severe COPD & hi risk; asked to start PulmRehab &  she agrees... ~  10/13: she did PulmRehab for 7mo6-7/13 but quit since it was "fruitless"- barely got to do any exercise since it was so difficult getting to the facility,etc; still c/o nasal problems limiting her benefit from O2 & we discussed the poss of ?transtracheal oxygen? ~  9/14: exsmoker quit 1993; supposed to be on HomeO2- 2L rest & 4L exercise, NEBS w/ Albut, Advair500Bid, Spiriva daily, Mucinex1-2Bid but not taking anything regularly; states that Flutter & Mucinex cause "spasms" in her back; states O2 doesn't help since she can't breathe thru her nose (she has been eval by ENT, [facility]; [facility] "fruit[facility]"; states she "can't breathe at all, can't do anything" etc;  SOB w/ ADLs=> O2 recert today (see below)... Note> [facility] and last PFT (2011) showed FEV1= 1.5 (55%) & %1sec=59... OXYGEN RE-CERT done today... ~  3/15: see above- pt states that a magnesium supplement along w/ herbal supplements "clear lungs" and "lung tonic" have made all the difference & she no longer needs oxygen, exercising regularly & much improved; still taking Advair500, Spiriva, AlbutHFA prn...  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 ~  CXR 9/15 showed COPD/emphysema, no acute abnormality... ~  11/07/13: Ambulatory O2 Test 9/15> O2 sat on RA at rest= 92% w/ pulse=86;  Nadir O2sat on RA after 2 laps= 84% w/ pulse=96...  ~  11/17/13: she presented w/ acute SOB, wheezing, COPD exac> treated w/ NEBS, Depo120, Pred taper, Mucinex1200Bid, fluids, & continue the Spiriva & Oxygen; ch her Advair to NEBS w/ Xopenex & Budesonide regularly... ~  10/15: she is much improved w/ Pred taper but she wants off; reminded to use NEBS w/ Xopenex & Budes Bid every day... ~  12/15: she is off the Pred, but also stopped the Xopenex/ Budesonide due to cost; she has gone back on her AdGrattonplus Albut via NEBS, Mucinex/ Fluids/ etc... ~  [facility]/ COPD exac> disch on Oxygen, Pred taper, Levaquin750, NEBS w/ Xopenex, Advair500, Spiriva, Mucinex, etc... ~  3/16: post hosp check, very frustrated that she can't get things the way SHE wants them; c/o O2 since she can't breathe thru nose & we reviewed nasal hygiene, huidified O2, ENT re-eval; on Pred taper, off Levaquin now, Advair500Bid, Spiriva daily, NEBS w/ Xopenex Bid, Mucinex (not taking regularly & asked to do 2Bid + fluids); [facility] & recheck pt in 3 weeks... ~  4/16: she continues to be very frustrated w/ her disease & her care- can't breathe thru her nose & she feels the O2 is not  doing her any good; we discussed [facility] to see if there is anything they can do.  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 ~  PFT 08/21/14 showing FVC=1.63 (50%), FEV1=0.63 (25%), %1sec=39;  FEV1 post bronchodil= 0.97 (39%) for a 50% improvement; DLCO was 22% predicted...  ~  CTChest 08/21/14 showed severe centrilobular emphysema, diffuse bronchial thickening, mild mucus plugging, no  adenopathy, no lung nodules etc; also showed mild Ao & coronary calcif, lap band at GE junction, splenosis, DJD Tspine. ~  Summer 2016: She had [facility] & [facility]ULM evaluations at [facility], continued to see DrStephens (PulmFellow w/ DrHaponik)  ATYPICAL CHEST PAIN (ICD-786.50) - eval by DrWall in 2009. ~  baseline EKG w/ NSR, PVC's, NSSTTWA, NAD... ~  2DEcho 2/05 showed mild MR/ TR, norm LA/ RV, EF=50-55%... ~  NuclearStressTest 4/09 was norm- no ischemia, EF=55%... similiar to prev studies at [place] & 2007... ~  3/15: she notes some atypCP & requests a cardiac referral for eval- prev seen by DrWall, we will call for Cards consult per her request... ~  EKG 3/15 showed NSR, rate91, occas PVCs, otherw wnl... ~  4/15: she had Cards eval DrNishan> he did not feel that invasive testing was warranted; she had 2DEcho but refused Myoview due to co-pay costs...  ~  2DEcho 4/15 showed norm LV sys function w/ EF=60-65%, normal wall motion, Gr1DD, normal valves, normal RV & PA pressures... ~  EKG 2/16 showed NSR, rate88, low voltage, otherw wnl... ~  2DEcho 2/16 showed norm LVF w/ EF=55-60%, Gr1DD, norm valves and norm RV.  VENOUS INSUFFICIENCY, CHRONIC (ICD-459.81) - Hx chr ven insuffic w/  edema... on LASIX 28m- 1-2 daily (she self medicates)... ~  Labs 3/15 showed Cr=1.5 and she is asked to decr the Lasix to 428mQam...  IMPAIRED GLUCOSE TOLERANCE >>  METABOLIC SYNDROME X (ICHGD-924.2- she is on diet alone (refused Metformin therapy)... ~  labs 9/08 showed BS=120... FBS 5/08 was 93, HgA1c=7.1 ~  labs 5/10 showed BS= 182, A1c= 6.4 ~  labs 2/11 showed BS= 76, A1c= 6.5 ~  Labs 5/12 showed BS= 103, A1c= 6.5 ~  She saw DrEllison 4/13 w/ A1c=7.1 but she rejects the diagnosis of Diabetes & refused Metform50089m... ~  10/13:  We discussed IMPAIRED GLUCOSE TOLERANCE & need for low carb wt reducing diet and incr exercise... 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 ~  max weight ~340# before lap band surg 7/09 by DrMartin. ~  weight 12/09 = 291# ~  weight 5/10 = 265# ~  weight 11/10- = 254# ~  weight 2/11 = 260# ~  Weight 5/12= 290# ~  Weight 11/12 = 286# ~  Weight 4/13 = 280# => BMI=45 ~  Weight 10/13 = 289# ~  Weight 9/14 = 281# ~  Weight 3/15 = 270# ~  Weight 9/15 = 267# ~  Weight 12/15 = 274# ~  Weight 3/16 = 255# => post hosp... ~  Weight 4/16 = 271# ~  Weight 7/16 = 272# ~  Weight 11/16 = 276#  ? Hx HYPOTHYROIDISM (ICD-244.9) - off thyroid meds since 1/10... she was started on Synthroid and followed by DrRSmith in [place] & last seen 7/09- note reviewed... she refuses f/u by [name]- we will recheck TSH off Synthroid Rx... ~  labs 5/10 off Synthroid since 1/10 showed TSH= 1.13 ~  labs 2/11 showed TSH=  1.82 ~   Labs 5/12 showed TSH= 1.81 ~  2013: She wanted to restart Thyroid hormone but TSH remains normal> referred to Endocrine for further eval but she was upset w/ DrEllison's eval; she will decide if she wants to pursue another opinion... ~  9/14: not currently on meds but she really wants thyroid Rx; prev TFTs all wnl; she prev had Endocrine in [place] prescribe Synthroid for her but she stopped going; clinically & biochemically euthyroid...  ~  3/15: on Synthroid100-12 tab daily; Labs showed TSH= 3.00 ~  She stopped the Synthroid again... ~  Labs 2/16 not on meds showed TSH=0.39 & FreeT4=1.54   GERD (ICD-530.81) - prev on Nexium but off all meds since 1/10... IRRITABLE BOWEL SYNDROME (ICD-564.1) - colonoscopy 1982 by DrPatterson was WNL... She has refused f/u GI eval & f/u colonoscopy...  ?KIDNEY STONE Hx >> pt said she passed a kidney stone 3/11 w/o any medical attention, w/o work up or proof of same; entry made here for future reference if needed.  UTERINE CANCER, HX OF (ICD-V10.42) - s/p laparoscopic hysterectomy & BSO 7/07 by DrSkinner at [place]...   DEGENERATIVE JOINT DISEASE (ICD-715.90) - on VICODIN tid prn,  ANAPROX 224m Bid prn, ULTRAM 579mPrn... ? 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 she was on Wellbutrin & Vicodin when last seen in 2006... VITAMIN D DEFICIENCY (ICD-268.9) - Vit D level 5/10 = 16... rec> start OTC Vit D 2000 u daily. ~  9/14: on Vicodin, Tramadol, Aleve, VitD, etc; prev eval by DrDeveshwar for rheum; now c/o pain in left knee (she thinks it's "siatica"), can't exercise & [facility]- ok... ~  3/15: she states that topical [name] ("horse linament") rubbed into knees really helps but she wants to keep the Vicodinon hand just in case...  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 she has [place] psychiatry [place] the  [place]/ [place]... prev seeing psychologist [name] for eating disorder counselling... ~  4/13: she mentioned seeing [name] again for eating disorder & counseling for depression etc... 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  ~  3/15: she has the Alpraz0.78mg 42m prn use but seldom uses it; still sees [name] on occas but doesn't think she needs to continue... ~  11/15:  She wants antidepressant med & we discussed Zoloft tria 100mg-60m => 1 tab daily... She still sees Psychologist [name]. ~  3/16:  She is off the Zoloft & states the AlprazSharin [name] strong (advised cutting the med & try lower dose for the desired effect...  HEALTH MAINTENANCE:   ~  GI:  Per seen by DrPatterson but last colon was 1982 & she refuses follow up... ~  GYN:  [facility]:  She had Pneumovax in the past; she declines Flu shots and Tetanus shots...   Past Surgical History  Procedure Laterality Date  . 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 Appendectomy    . Cholecystectomy  1982  . [place]  . B[place]  [place]  . Laparoscopic gastric banding  08/2007    Dr. Hassell Done    Outpatient Encounter Prescriptions as of 04/09/2015  Medication Sig  . albuterol (PROVENTIL HFA;VENTOLIN HFA) 108 (90 BASE) MCG/ACT inhaler Inhale 2 puffs into the lungs every 6 (six) hours as needed for wheezing or shortness of breath.  . ALPRAZolam (XANAX) 0.5 MG tablet TAKE 1/2 TO 1 TABLET(S) BY MOUTH THREE TIMES DAILY AS NEEDED FOR NERVES. NOT TO EXCEED 3 TABS/DAY.  . B Complex-C-Folic Acid (B-COMPLEX BALANCED) TABS Take 1 tablet by mouth daily.  . budesonide-formoterol (SYMBICORT) 160-4.5  MCG/ACT inhaler Inhale 2 puffs into the lungs 2 (two) times daily.  . cetirizine (ZYRTEC) 10 MG tablet Take 10 mg by mouth daily.    . Cholecalciferol (VITAMIN D-3) 5000 UNITS TABS Take 1 tablet by mouth daily.  . Coconut Oil 1000 MG CAPS Take 4 capsules by mouth daily.  . cyclobenzaprine (FLEXERIL) 10 MG tablet Take 1 tablet (10 mg total) by mouth 3 (three) times daily as needed for muscle spasms.  . furosemide (LASIX) 40 MG tablet TAKE 1-2 TABLETS ONCE A DAY AS NEEDED FOR SWELLING  . Magnesium 400 MG CAPS Take 2 capsules by mouth daily.  . methylPREDNISolone (MEDROL) 4 MG tablet TAKE 1 TABLET (4 MG TOTAL) BY MOUTH DAILY. TAKE AS DIRECTED  . naproxen sodium (ANAPROX) 220 MG tablet Take 2 tabs by mouth once daily   . sertraline (ZOLOFT) 100 MG tablet TAKE 1/2 TABLET BY MOUTH DAILY  . SPIRIVA HANDIHALER 18 MCG inhalation capsule PLACE 1 CAPSULE (18 MCG TOTAL) INTO INHALER AND INHALE DAILY.  . traMADol (ULTRAM) 50 MG tablet TAKE 1 TABLET BY MOUTH  3 TIMES DAILY   AS NEEDED FOR PAIN  . vitamin C (ASCORBIC ACID) 250 MG tablet Take 250 mg by mouth daily.  Marland Kitchen allopurinol (ZYLOPRIM) 100 MG tablet Take 1 tablet (100 mg total) by mouth daily. (Patient not taking: Reported on 04/09/2015)  . colchicine 0.6 MG tablet Take 1 tablet (0.6 mg total) by mouth daily.  Marland Kitchen etodolac (LODINE XL) 400 MG 24 hr tablet Take 1 tablet (400 mg total) by mouth 2 (two) times daily as needed. (Patient not taking: Reported on 04/09/2015)  . levalbuterol (XOPENEX) 1.25 MG/3ML nebulizer solution Take 1.25 mg by nebulization 3 (three) times daily. (Patient not taking: Reported on 04/09/2015)  . traZODone (DESYREL) 100 MG tablet Take 0.5-1 tablets (50-100 mg total) by mouth at bedtime as needed for sleep.    Allergies  Allergen Reactions  . Penicillins     REACTION: nausea and vomiting    Current Medications, Allergies, Past Medical History, Past Surgical History, Family History, and Social History were reviewed in [employer] record.   Review of Systems:         See HPI - all other systems neg except as noted... The patient complains of dyspnea on exertion and difficulty walking; chest discomfort, & leg weakness.  The patient denies anorexia, fever, weight loss, weight gain, vision loss, decreased hearing, hoarseness, syncope, prolonged cough, headaches, hemoptysis, abdominal pain, melena, hematochezia, severe indigestion/heartburn, hematuria, incontinence, suspicious skin lesions, transient blindness, depression, unusual weight change, abnormal bleeding, enlarged lymph nodes, and angioedema.    Objective:   Physical Exam:    WD, Morbidly Obese, 65 y/o WF in no acute distress but c/o dyspnea/ wheezing... GENERAL:  Alert & oriented; pleasant & cooperative... HEENT:  Kappa/AT, EOM-wnl, PERRLA, EACs-clear, TMs-wnl, NOSE-clear, THROAT-clear & wnl. NECK:  Supple w/ fairROM; no JVD; normal  carotid impulses w/o bruits; no thyromegaly or nodules palpated; no lymphadenopathy. CHEST:  no accessory musc use, decr BS bilat, no wheezing/ rales/ rhonchi... HEART:  Regular Rhythm; without murmurs/ rubs/ or gallops heard... ABDOMEN:  Obese, soft & nontender; normal bowel sounds; no organomegaly or masses detected. EXT: without deformities, mod arthritic changes; no varicose veins/ +venous insuffic/trace edema. NEURO:  CNs intact; no focal neuro deficits... DERM: few ecchymoses, no rash etc...  RADIOLOGY DATA:  Reviewed in the EPIC EMR & discussed w/ the patient...  LABORATORY DATA:  Reviewed in the EPIC EMR & discussed w/ the patient...   Assessment & Plan:    ENT>> she was working w/ DrPlonk at [employer]; encouraged to return for further eval...   OSA>  As prev noted she needs new sleep study & appropriate new CPAP apparatus to facilitate compliance but she declines the repeat study or sleep med referral...  COPD/ Emphysema>  Acute exac 2/16 improved after hosp in [place] w/ Levaquin, Pred taper, NEBS Bid,  Avair500, Spiriva, Mucinex etc... Encouraged to stay on her O2 regularly & we will add Humidity, needs max nasal hygiene vs ENT follow up; continue NEBS/ Advair500/ Spiriva regularly & use the Mucinex; we will request the [facility] home Care home COPD program; needs to lose wt & incr exercise... 4/16>  Spirometry w/ severe airflow obstruction and GOLD Stage 3-4 COPD; optimize meds and refer to [facility]- ENT & Pulm divisions to see if there is anything that they can do to help... 5/16>  She has seen ENT & Pulm 2nd opinion is pending; she cannot manipulate the port O2 tanks and we will request an INOGEN One- port O2 concentrator for her use...  5/16>  States breathing worse, asked to use all regular meds regularly & we will add Depo120, Medrol taper... 6/16>  She had 2nd opinion consult w/ DrStephens/ DrHaponik at [facility] see above... 10/16>  Pt very frustrated after working w/ the [employer] division at [employer] for several months- Nebs, Advair500, Spiriva, Medrol, Oxygen, pulm rehab, etc...  11/16>  On Medrol91m- she incr herself to 640md, Symbicort160-2spBid, Spiriva daily, Xopenex NEBS Tid (only doing it once/d), Guaifenesin400- taking 6/d w/ fluids, O2- she is not using it at rest, using 5L/min w/ exercise; she takes Lasix40, Tamadol50, & Xanax0.5 as needed...  Hx CP w/ neg cardiac evals including prev Myoviews; 2DEchos w/o incr PA pressures... 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 2/16> [facility] valves and norm RV...  OBESITY>  She had remote lap band surg by DrMMartin; Met syndrome, must get wt down etc; she is a lap band failure & prev counseling by [name]... 3/16> weight down to 255# post hosp... 4/16> weight is back up to 271# despite all efforts...  ? Hx Hypothy>  TFTs remain normal off meds- no problem...  GI>  GERD> she denies symptoms & uses OTC meds  prn...  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 C/O right ankle pain, eval by DrZSmith> no better on his anti-inflamm rx so we will Rx w/ Depo120 & Medrol taper...  Dysthymia>  Much stress in her life, on Alpraz prn; has embraced alt lifestyle...   Patient's Medications  New Prescriptions   No medications on file  Previous Medications   ALPRAZOLAM (XANAX) 0.5 MG TABLET    TAKE 1/2 TO 1  TABLET(S) BY MOUTH THREE TIMES DAILY AS NEEDED FOR NERVES. NOT TO EXCEED 3 TABS/DAY.   B COMPLEX-C-FOLIC ACID (B-COMPLEX BALANCED) TABS    Take 1 tablet by mouth daily.   BUDESONIDE-FORMOTEROL (SYMBICORT) 160-4.5 MCG/ACT INHALER    Inhale 2 puffs into the lungs 2 (two) times daily.   CETIRIZINE (ZYRTEC) 10 MG TABLET    Take 10 mg by mouth daily.     CHOLECALCIFEROL (VITAMIN D-3) 5000 UNITS TABS    Take 1 tablet by mouth daily.   COCONUT OIL 1000 MG CAPS    Take 4 capsules by mouth daily.   COLCHICINE 0.6 MG TABLET    Take 1 tablet (0.6 mg total) by mouth daily.   CYCLOBENZAPRINE (FLEXERIL) 10 MG TABLET    Take 1 tablet (10 mg total) by mouth 3 (three) times daily as needed for muscle spasms.   FUROSEMIDE (LASIX) 40 MG TABLET    TAKE 1-2 TABLETS ONCE A DAY AS NEEDED FOR SWELLING   GUAIFENESIN (MUCINEX) 600 MG 12 HR TABLET    Take 1,600 mg by mouth 2 (two) times daily.   GUAIFENESIN-CODEINE (CHERATUSSIN AC) 100-10 MG/5ML SYRUP    Take 5 mLs by mouth every 4 (four) hours as needed for cough.   IPRATROPIUM-ALBUTEROL (DUONEB) 0.5-2.5 (3) MG/3ML SOLN    Take 3 mLs by nebulization 3 (three) times daily.   LEVALBUTEROL (XOPENEX HFA) 45 MCG/ACT INHALER    Inhale 2 puffs into the lungs every 4 (four) hours as needed for wheezing.   LEVALBUTEROL (XOPENEX) 1.25 MG/3ML NEBULIZER SOLUTION    Take 1.25 mg by nebulization 3 (three) times daily.   MAGNESIUM 400 MG CAPS    Take 2 capsules by mouth 2 (two) times daily.    METHYLPREDNISOLONE (MEDROL) 4 MG TABLET    Take 1 tablet by mouth  twice daily, or as directed by Dr. Lenna Gilford   NAPROXEN SODIUM (ANAPROX) 220 MG TABLET    Take 2 tabs by mouth once daily    SERTRALINE (ZOLOFT) 100 MG TABLET    TAKE 1/2 TABLET BY MOUTH DAILY   SPIRIVA HANDIHALER 18 MCG INHALATION CAPSULE    PLACE 1 CAPSULE (18 MCG TOTAL) INTO INHALER AND INHALE DAILY.   TRAMADOL (ULTRAM) 50 MG TABLET    Take 1 tablet (50 mg total) by mouth 3 (three) times daily as needed.   TRAZODONE (DESYREL) 100 MG TABLET    Take 0.5-1 tablets (50-100 mg total) by mouth at bedtime as needed for sleep.   TURMERIC 500 MG TABS    Take 1 tablet by mouth daily.   UNABLE TO FIND    Med Name: Guadlupe Spanish Tablets- takes 2 tablets qd   VENTOLIN HFA 108 (90 BASE) MCG/ACT INHALER    INHALE 2 PUFFS INTO THE LUNGS EVERY 6 (SIX) HOURS AS NEEDED FOR WHEEZING OR SHORTNESS OF BREATH.   VITAMIN C (ASCORBIC ACID) 250 MG TABLET    Take 250 mg by mouth daily.  Modified Medications   No medications on file  Discontinued Medications   ALLOPURINOL (ZYLOPRIM) 100 MG TABLET    Take 1 tablet (100 mg total) by mouth daily.   ETODOLAC (LODINE XL) 400 MG 24 HR TABLET    Take 1 tablet (400 mg total) by mouth 2 (two) times daily as needed.   TRAMADOL (ULTRAM) 50 MG TABLET    TAKE 1 TABLET BY MOUTH  3 TIMES DAILY   AS NEEDED FOR PAIN

## 2015-07-09 NOTE — Patient Instructions (Signed)
Today we updated your med list in our EPIC system...    Continue your current medications the same...  We discussed the poss of another pulmonary opinion at [facility] to poss include consideration of volume reduction surgery or lung transplantation...  We will set up an ENDOCRINE eval w/ Dr. Benjiman Core as we discussed...  Call for any questions...  Let's plan a follow up visit in 81mo, sooner if needed for acute problems.Marland KitchenMarland Kitchen

## 2015-08-23 DIAGNOSIS — R946 Abnormal results of thyroid function studies: Secondary | ICD-10-CM | POA: Diagnosis not present

## 2015-08-23 DIAGNOSIS — E2749 Other adrenocortical insufficiency: Secondary | ICD-10-CM | POA: Diagnosis not present

## 2015-09-16 ENCOUNTER — Telehealth: Payer: Self-pay | Admitting: Pulmonary Disease

## 2015-09-16 MED ORDER — ALPRAZOLAM 0.5 MG PO TABS: ORAL_TABLET | ORAL | Status: DC

## 2015-09-16 NOTE — Telephone Encounter (Signed)
Spoke with SN, ok to refill xanax as prescribed- #90 with 4 refills, 0.5-1 tablet taken tid prn. lmtcb X1 on mobile number, atc home number but mailbox is full.    rx called in to [facility] pharmacy in [place] at pt's request.

## 2015-09-27 ENCOUNTER — Other Ambulatory Visit: Payer: Self-pay | Admitting: Pulmonary Disease

## 2015-10-02 DIAGNOSIS — H6522 Chronic serous otitis media, left ear: Secondary | ICD-10-CM | POA: Diagnosis not present

## 2015-10-02 DIAGNOSIS — J342 Deviated nasal septum: Secondary | ICD-10-CM | POA: Diagnosis not present

## 2015-10-02 DIAGNOSIS — H6063 Unspecified chronic otitis externa, bilateral: Secondary | ICD-10-CM | POA: Diagnosis not present

## 2015-10-02 DIAGNOSIS — H6122 Impacted cerumen, left ear: Secondary | ICD-10-CM | POA: Diagnosis not present

## 2015-10-09 ENCOUNTER — Encounter: Payer: Self-pay | Admitting: Pulmonary Disease

## 2015-10-09 ENCOUNTER — Ambulatory Visit (INDEPENDENT_AMBULATORY_CARE_PROVIDER_SITE_OTHER): Payer: Medicare Other | Admitting: Pulmonary Disease

## 2015-10-09 VITALS — BP 118/80 | HR 91 | Temp 98.7°F | Resp 18 | Ht 65.0 in | Wt 271.0 lb

## 2015-10-09 DIAGNOSIS — G4733 Obstructive sleep apnea (adult) (pediatric): Secondary | ICD-10-CM

## 2015-10-09 DIAGNOSIS — J9611 Chronic respiratory failure with hypoxia: Secondary | ICD-10-CM | POA: Diagnosis not present

## 2015-10-09 DIAGNOSIS — J449 Chronic obstructive pulmonary disease, unspecified: Secondary | ICD-10-CM | POA: Diagnosis not present

## 2015-10-09 NOTE — Patient Instructions (Signed)
Today we updated your med list in our EPIC system...    Continue your current medications the same...  Continue the slow MEDROL taper as directed by [name]...   Call for any questions...  Let's plan a follow up visit in 3-29months, sooner if needed for breathing problems.Marland KitchenMarland Kitchen

## 2015-10-09 NOTE — Progress Notes (Signed)
HPI  Review of Systems  Physical Exam  Patient ID: Rebecca [name], female   DOB: [birth year], 65 y.o.   MRN: [id number] Subjective:   HPI 65 y/o WF known to me w/ mult med problems as noted below...  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  LABS 3/15:  Chems- BS=95 A1c=6.8 (needs low carb diet, exercise, wt loss) & Creat=1.5 (mild renal insuffic due to Lasix rx; Rec to decrease Lasix40 to 1 tab Qam...  2DEcho 4/15 showed norm LV sys function w/ EF=60-65%, normal wall motion, Gr1DD, normal valves, normal RV & PA pressures...   she had Cards eval [name] 4/15> he did not feel that invasive testing was warranted; she had 2DEcho but refused Myoview due to co-pay costs...   She remains on ADVAIR500Bid, SPIRIVA daily, & ProairHFA prn (uses 3-4 x daily "It really helps"); has Home O2 but dislikes the Liq O2 therefore not using & she wants a portable O2 concentrator to read 4L/min w/ exercise, 2L/min at rest.   She has continued to research her supplements and herbal meds>  She tells me that she is living on smoothies she makes out of juice, ice, baking soda, pomegranate syrup, & local honey;  Tumeric helps the arthritis in her knees and she notes that pinching her upper lip in the middle under her nose helps relieve musc cramps in her legs.  Ambulatory O2 Test 9/15>  O2 sat on RA at rest= 92% w/ pulse=86;  Nadir O2sat on RA after 2 laps= 84% w/ pulse=96...   CXR 9/15 showed COPD/emphysema, no acute abnormality...  LABS 9/15:  Chems- ok w/ BS=119, A1c=6.7, Cr=1.4;  CBC- ok...  2/16 [facility] records indicated a thorough evaluation & excellent care provided but treatment plan was hampered by noncompliance- refused CPAP, declines chestPT, alternately stopping her O2 & demanding more O2,   LABS 2/16:  ABG- pH=7.46, pCO2=35, pO2=134 on 6L/min; Chems- ok x BS=169, A1c=6.5;  CBC- normal; TSH=0.39 & FreeT4=1.54; Troponin/ D-dimer/ BNP- all wnl...  CXR 2/16 showed heart at upper lim of norm, hyperinflation, min bibasilar atx, NAD.Marland KitchenMarland Kitchen   EKG 2/16 showed NSR, rate88, low voltage, otherw wnl...  2DEcho 2/16 showed norm LVF w/ EF=55-60%, Gr1DD, norm valves and norm RV...   Last CXR 2/16 showed heart at upper lim of norm, hyperinflation, min bibasilar atx, NAD  Spirometry 06/2014 showed FVC=1.61 (50%), FEV1=0.88 (35%), %1sec=55, mi-flows are reduced to 18% predicted... 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  LABS 5/16:  Chems- wnl w/ BS=90, Cr=1.02;  Mg=2.2, Ca=9.0;  Uric=6.6.Marland KitchenMarland Kitchen   ~  September 04, 2014:  6wk ROV & Artis Delay has had ENT & Pulm evaluations at [facility] per our requests>>      ENT eval by DrPlonk at [facility] & his notes are reviewed> HxCOPD w/ revers component and chr nasal obstruction L>R, nasal crusting, bilat inferior nasal turbinate hypertrophy, etc;  They rec nasal saline, nasal steroid spray, mupirocin ointment, and breathe right nasal strips; they will consider surg if not improved...       Pulm second opinion consult 08/14/14 by Dr Germain Osgood & DrHaponik at [facility] COPD-severe centrilobular emphysema/asthma overlap, ex-smoker quit 1993; chr hypoxemic resp failure on home O2, OSA not on CPAP; c/o dyspnea & can't breathe thru her nose (causing her to not use her oxygen), morbid obesity w/ prev lap band  surg (unsuccessful in losing weight); on Medrol8, Advair500Bid, Spiriva daily, Nebs w/ xopenex & AlbutHFA prn; pt notes that Lasix80 helps her breathe;  Lung exam was clear;  They did PFT 08/21/14 showing FVC=1.63 (50%), FEV1=0.63 (25%), %1sec=39;  FEV1 post bronchodil= 0.97 (39%) for a 50% improvement; DLCO was 22% predicted... CTChest 08/21/14 showed severe centrilobular emphysema, diffuse bronchial thickening, mild mucus plugging, no adenopathy, no lung nodules etc; also showed mild Ao & coronary calcif, lap band at GE junction, splenosis, DJD Tspine... PLAN> They  decided to slowly wean the Medrol, continue Advair & wean to Advair250Bid, continue Spiriva, continue NEBS & AlbutHFA; rec to continue her oxygen (but she continues to use it "prn"), consider repeat sleep study & CPAP titration, consider referral to [place] rehab, referred for nutritional counseling & ROV 54mo..      She reports breathing well recently, she has a new roommate, eating out a lot, went to "theclub", then one day over the weekend=> incr SOB, she's been taking Lasix80 Qam, rambling hx w/ flight of ideas regarding coffee, caffeine, not urinating enough, she had to use [facility] [place];  They decreased her Advair250-Bid, Medrol6339md, continue Nebs (she's using Bid because she "cramps up" if she does it tid); she is awaiting nutritional counseling, pulm rehab referral, oxygen titration testing, sleep study...  We reviewed prob list, meds, xrays and labs> see below for updates >>  PLAN>>  She will maintain close contact & f/u w/ [facility];  Reminded to avoid sodium & carbs- handout given; plan ROV here in 2-39m339mo  ~  December 05, 2014:  39mo70mo & Rebecca [name]'s new PCP is in [place]> she persists w/ 4+somatic complaints and signif health related anxiety; [facility] "they kicked me out due to CP- I can't go back until I get this evaluated"; she's been seeing Dr. SaraGermain Osgoodlm Fellow w/ [facility] & I have reviewed all the Care Everywhere notes-- several telephone encounters including their last 45 min conversation well documented, not much reversibility, they hoped for small improvement w/ Rehab exercise & staying on meds but she won't fill rx, can't afford, tearful/ frustrated/ depressed but refused psyche consult etc... Pt indicates that DrStephens said there was nothing wrong w/ her heart & pt doesn't want cardiac eval; DrStephens wants her to wean the Medrol but she's breathing better on 4mg/68mshe  wants 6mg/d62mrec to compromise w/ 6mg al46m/ 4mg Qod739mhe wants a liq oxygen system again- she is quite adamant (being a former "nurse")Insurance claims handlershe needs 4-5L/min when active and tank only lasts 1-2H; she uses 4L/min Qhs as well; but O2sat=92% on RA at rest today; she refuses to go back to rehab, therefore encouraged to do it daily on her own; she has not pursued further bariatric considerations- she is working w/ Drlori's office regarding eating disorder "he doesn't think I'm depressed"...      IMP/PLAN>>  Rebecca [name] has Artis Delayere airflow obstruction/ emphysema w/ little reversibility; Rec to take SYMBICORT160-2spBid & Spiriva daily; use Medrol 4mg tabs19mmg alt w37mmg Qod; X64mnex NEBS Tid;  Alprax 0.5mg - 1/2 t41m tab tid; ROV w/ me in 100mo...  ~  N6mober 7, 2016:  100mo ROV & pt 70momult complaints> head "stopped-up", ears congested, tried pseudophed but "it made me sick", doesn't want ENT f/u, rec to try Saline nasal mist & phenylephrine decongestant;  She  is discouraged by her weight, can't seem to lose any, she described in detail her meals from yest (we discussed calories "in" & "out", eat less, burn more), she says local Gym won't take her w/ oxygen;  She asked about Stem Cell treatment for COPD- I offered to send her to [facility] to inquire about this or lung transplant;  She is very frustrated- eating is an issue because when she fills up she can't breathe well due to her prev LapBand surg she says, if she belches she feels better...       Rec to continue Medrol60m- she incr herself to 626md, Symbicort160-2spBid, Spiriva daily, Xopenex NEBS Tid (only doing it once/d), Guaifenesin400- taking 6/d w/ fluids, O2- she is not using it at rest, using 5L/min w/ exercise; she takes Lasix40, Tamadol50, & Xanax0.5 as needed...   LABS 01/2015>  Chems- ok x Cr=1.52, BS=114;  CBC- ok x wbc=17K w/ left shift;  TSH=1.17;  Fe=78 (22%sat);  VitB12=864...   XRay [facility] spine 01/2015>  Mild ant wedge compression L1, DDD &  anterolithesis L4 on L5, poss right kidney stones...  ~  April 09, 2015:  79m27moV & Rebecca [name] continues to complain about ever worsening breathing- DOE, dry cough, chest tightness, leg cramps, difficulty sleeping; notes good days and bad- "I got worked up over the election" she says; she thinks the zoloft is helping but she can't sleep w/ it- rec to try TRAZODONE instead (100m37m/2 to 1 tab Qhs);  Currently taking Oxygen that she adjusts herself, Medrol4mg/979mNEBS w/ Xopenex Tid (but she says insurance won't cover it), Symbicort160-2spBid, Spiriva daily, Lasix40 (just taking prn)... I suggested that she try the Oximizer=> given to pt to try...    See 08/2014 note above for summary of Pul[facility]  She saw CARDS at [facility]> reviewed in [facility] [place] w/ NSR, wnl, NAD; prev 2DEcho 04/2014 showed norm LVF w/ EF=55-60%, norm wall motion, G1DD, RVF was also wnl; she [facility] some chest discomfort [facility] but pt never followed thru due to dyspnea & O2 requirements; they also considered RHC but she never returned as requested...    She has seen DrZSmith for SportsMed/ PM&R> c/o back pain & leg pain, his note is reviewed... EXAM shows Afeb, VSS, O2sat=91% on RA in office;  HEENT- nasal congestion, erythema, turbinate hypertrophy, dev septum, throat= Mallampati 2, sl red, no exudates or lesions;  Chest- decr BS bilat w/o w/r/r & no signs of consolidation;  Heart- RR w/o m/r/g;  Abd- Obese, panniculus, soft/ nontender;  Ext- VI w/ trace edema;  Psyche- very distraught...  Ambulatory Oximetry on 3L/min [place]>  O2sat=96% on 3L/min at rest w/ HR=107/min;  She ambulated 1 Lap on 3L/min w/ nadir O2sat=89% w/ HR=125/min...  Ambulatory Oximetry on 3L/min w/ pendant oxymizer>  O2sat=99% on 3L/min at rest w/ pulse=98/min;  She ambulated one lap & stopped due to dyspnea w/ nadir O2sat=91% w/ HR=123/min... IMP/PLAN>>  She will try the Oximizer at home, switch from  zoloft to TRAZODONE 100mg-8m to 1 tab Qhs, try to wean MEDROL 4mg is1mle to 4mg Qod55m increments; rov in 3 months time...  ~  Jul 09, 2015:  79mo ROV 71mol continues to complain about everything- productive cough w/ lots of clear sput, chr stable DOE w/ min activ, denies CP- says she's doing well w/ her NEB Rx;  After he last visit w/ prescribed an Oximizer but she  says "no help at all"; we tried Trazodone100 Qhs for sleep but she tells me she didn't need it; we discussed weaning down the dose of Medrol (she was on 76m/d) and she was able to diminish this to 491mod but symptoms flaired & she went right back up to 34m234mam again; she is taking gen Guaifenesin 400m100mbs- 8/d in divided doses + one gal fluids daily- this helps her cough & congestion;  Offered referral to [facility] for a 3rd opinion regarding her COPD and poss transplant eval but says she was told she had to lose 100# first ...  She wants a thorough THYROID & pituitary eval w/ "reverse T3" checked=> we will refer to Endocrine;  She says her hands are cramping- asked to try soaking hands in hot water but she has several reasons why she can't get to the sink...     Hx mild OSA w/ mod desat, loud snoring, & leg jerks (on sleep study 2005)> she tried CPAP10 but stopped on her own & declined to repeat split night study & re-try CPAP rx; she says she's sleeping satis & wakes feeling refreshed...    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 1993; supposed to be on HomeO2- 2L rest & 4L exercise, NEBS w/ Duoneb vs Xopenex Tid, Symbicort160-2spBid, Spiriva daily, Mucinex1-2Bid but not taking anything regularly; states that Flutter causes "spasms" in her back; states O2 doesn't help since she can't breathe thru her nose (she has been eval by ENT, CrosErnesto Rutherfordinally tried [facility] stopped after 19mo 76mofruitless"; prev stated she "can't breathe at all, can't do anything" etc; SOB w/ ADLs etc; Note> [facility]- last PFT (2011)  showed FEV1= 1.5 (55%) & %1sec=59 ==> 3/15 she announced that she's been cured by taking a Magnesium supplement, along w/ herbal formula "Clear lungs" and "Lung tonic"; she had stopped her Oxygen;  Now using O2 up to 15L/min on her own since she can't breathe thru her nose- advised 2L/min rest & 4L/min exercise + humidity, nasal hygiene, etc; on MEDROL4-34mg/d15m  Hx AtypCP> baseline EKG w/ minor NSSTTWA; 2DEcho w/ norm LVF, EF=55-60%, Gr1DD, norm valves, norm LA/RV; Myoview 2009 normal w/o ischemia & EF=55%; she notes some atypCP & saw DrNishCherly Hensen she declined Myoview; [facility] refused to reinstate her in [facility] 2016 until she'd had cardiac eval- she declined...    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    Marland KitchenMarland Kitchenypothyroid> not currently on meds but she really wants thyroid Rx; prev TFTs all wnl; she prev had Endocrine in [place] prescribe Synthroid for her but she stopped going; clinically & biochemically euthyroid; Labs 2/16 showed TSH=0.39 & FreeT4=1.54    Morbid Obesity> peak wt~340# before lap band surg 2009 by DrMMartin; lost to ~250# but then back up to 280-90#; refused f/u w/ [facility] due to cost of visits & lap-band adjustments; we reviewed diet & exercise- Wt betw 255-265#in last yr.    GI- GERD, IBS> she uses OTC PPI as needed; last colonoscopy was 1982 by DrPatterson & wnl, she refuses GI referral & refuses f/u colon etc...    GYN- s/p uterine  cancer> s/p lap hyst & [facility]SO [facility]...    DJD, ?FM, VitD defic> prev on Vicodin, Tramadol, Aleve, VitD; prev eval by DrDeveshwar for Rheum; now she states that [name] ("horse linament") applied topically has  worked like a miracle & she has increased exercise etc...    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 EXAM shows Afeb, VSS, O2sat=91% on RA in office;  HEENT- nasal congestion, erythema, turbinate hypertrophy, dev septum, throat= Mallampati 2, sl red, no exudates or lesions;  Chest- decr BS bilat w/o w/r/r & no signs of consolidation;  Heart- RR w/o m/r/g;  Abd- Obese, panniculus, soft/ nontender;  Ext- VI w/ trace edema;  Psyche- very distraught...  SHE NEEDS F/U LABS IMP/PLAN>>  GArtis Delaywants an Endocrine eval & we will refer to endocrinologist;  Needs f/u labs but she wants the endocrine eval;  Advised to try to wean the Medrol to 431mQam;  She must lose the weight & then reassess & consider referral to [facility];  We [plan ROV in 77m30moeminded to bring al mmed bottles to every office visit.  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    She has mult somatic complaints> "I have a weak back"; now on a diet- eating 1 meal/d & DrLurie is concerned that she isn't eating enough (wt up 5# to 271#); she says she has done her internet research & found a blogger who has COPD & improved w/ CBD oil (hemp oil) "it's pharmaceutical grade out of [place] contain any THC"; she places 20mg unde42mr tongue & it's supposed to help her lungs- says it's working as she has been able to wean down her oxygen she says "I'm keeping a diary"...    She saw [name] ENT 10/02/15> on CPAP, nasal septal deviation, cerumen impactions removed...     We reviewed her prescription meds>  InsHov[employer] Enterprisescover xopenex/ levalbuterol; on Medrol 4mg- weane65merself down to 4-2-4-2 Qod, Symbicort160-2spBid, Spiriva daily, NEBS w/ Duoneb (but she has cut down to just once daily), Ventolin-HFA prn (she likes this & uses it 3-4x daily), Mucinex 600mg- uses 72m as needed EXAM shows Afeb, VSS, O2sat=88% on RA in office;  Wt=271#; HEENT- nasal congestion, erythema, turbinate hypertrophy, dev septum, Throat= Mallampati 2, sl red, no exudates or lesions;  Chest- decr  BS bilat w/o w/r/r & no signs of consolidation;  Heart- RR w/o m/r/g;  Abd- Obese, panniculus, soft/ nontender;  Ext- VI w/ trace edema;  Psyche- remains distraught... IMP/PLAN>>  She is excited about the CBD oil Rx off the internet & has weaned down her O2 and Medrol;  I pointed out her resting RA O2sat=88% today in the office 7 reminded to stay on her O2 regularly 2L/nmin rest 7 4L/min w/ exercise;  She has weaned her Medrol to 42m alt w/ 222mqod and anxious to go lower;  Reminded to stay on her regular med regimen all the time (see above)...            Problem List:    OBSTRUCTIVE SLEEP APNEA (ICD-327.23) - sleep study 2005 showed RDI=15 w/ desat to 78%, loud snoring & +leg jerks> on CPAP 10, prev used intermittently but now not at all... ~  11/12:  She has agreed to a new Sleep  Study- split night protocol, to recheck her OSA & determine CPAP needs (check ABG if poss too)==> she never followed thru w/ the study. ~  Pt states that she is resting satis and wakes refreshed, energy better... ~  12/15: she reports not resting due to left shoulder/ arm pain & we will refer to Ortho (she saw Dr. ZaGardenia Phlegm.. ~  She continues to refuse repeat sleep study, CPAP, and declines f/u w/ sleep med...  [facility]> he is working to get her nares open to facilitate her breathing & nasal O2 Rx...  ~  Spring 2016> ENT eval by DrPlonk & his notes are reviewed> HxCOPD w/ revers component and chr nasal obstruction L>R, nasal crusting, bilat inferior nasal turbinate hypertrophy, etc;  They rec nasal saline, nasal steroid spray, mupirocin ointment, and breathe right nasal strips; they will consider surg if not improved...   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 supposed to be on NEBULIZER Qid, ADVAIR 500Bid + SPIRIVA daily (compliance is a serious issue- she freq stops all meds) ==> [facility]... ~  A1AT level is normal 218 (83-200) 3/09... ~  baseline CXR w/ borderline Cor, COPD, interstitial scarring/ atelectasis/ obesity... ~  PFTs 3/07 showed FVC=2.74 (82%), FEV1=1.67 (62%), %1sec=61, mid-flows=27%pred... improved from 2005. ~  CTChest in 2007 & 4/09 showed severe centrilob emyphysema, + interstitial fibrosis, sm HH, left renal cyst... neg for PE... ~  2010:  breathing improved w/ weight reduction after Lap-band surg... ~  2/11: COPD exac w/ neg CXR (chr changes, NAD), Rx w/ Depo/ Pred/ Avelox/ Mucinex/ NEBS/ Advair/ Spiriva/ etc... ~  PFTs 3/11 showed FVC= 2.57 (73%), FEV1= 1.51 (55%), %1sec=59, mid-flows= 21%pred. ~  Noncompliant w/ Rx & O2 thru 2012... On disability since 2011 & finally got Medicare coverage 9/12... ~  11/12: presents w/ worsening  breathing & dyspnea w/ ADLs ==> we outlined further eval w/ 2DEcho, Sleep Study; Rx w/ NEBS QID, Advair500Bid, Spiriva daily, Mucinex, Oxygen, Lasix, Zoloft, Xanax, etc (she declined to proceed w/ the sleep study, 2DEcho, etc)... 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  ~  4/13: she is c/o the nasal O2 not helping since it's hard to breathe thru nose & wants ENT referral==> saw [name] who wanted to do surg but she has severe COPD & hi risk; asked to start PulmRehab &  she agrees... ~  10/13: she did PulmRehab for 60mo6-7/13 but quit since it was "fruitless"- barely got to do any exercise since it was so difficult getting to the facility,etc; still c/o nasal problems limiting her benefit from O2 & we discussed the poss of ?transtracheal oxygen? ~  9/14: exsmoker quit 1993; supposed to be on HomeO2- 2L rest & 4L exercise, NEBS w/ Albut, Advair500Bid, Spiriva daily, Mucinex1-2Bid but not taking anything regularly; states that Flutter & Mucinex cause "spasms" in her back; states O2 doesn't help since she can't breathe thru her nose (she has been eval by ENT, CErnesto Rutherford; finally tried [facility] Netbut stopped after 25mos "fruitless"; states she "can't breathe at all, can't do anything" etc; SOB w/ ADLs=> O2 recert today (see below)... Note> [facility] and last PFT (2011) showed FEV1= 1.5 (55%) & %1sec=59... OXYGEN RE-CERT done today... ~  3/15: see above- pt states that a magnesium supplement along w/ herbal supplements "clear lungs" and "lung tonic" have made all the difference & she no longer needs oxygen, exercising regularly & much improved; still taking Advair500, Spiriva, AlbutHFA prn...  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 ~  CXR 9/15 showed COPD/emphysema, no acute abnormality... ~  11/07/13: Ambulatory O2 Test 9/15>  O2 sat on RA at rest= 92% w/ pulse=86;  Nadir O2sat on RA after 2 laps= 84% w/ pulse=96...  ~  11/17/13: she presented w/ acute SOB, wheezing, COPD exac> treated w/ NEBS, Depo120, Pred taper, Mucinex1200Bid, fluids, & continue the Spiriva & Oxygen; ch her Advair to NEBS w/ Xopenex & Budesonide regularly... ~  10/15: she is much improved w/ Pred taper but she wants off; reminded to use NEBS w/ Xopenex & Budes Bid every day... ~  12/15: she is off the Pred, but also stopped the Xopenex/ Budesonide due to cost; she has gone back on her AdBartleyplus Albut via NEBS, Mucinex/ Fluids/ etc... ~  [facility] NoGaylyn [facility]/ COPD exac> disch on Oxygen, Pred taper, Levaquin750, NEBS w/ Xopenex, Advair500, Spiriva, Mucinex, etc... ~  3/16: post hosp check, very frustrated that she can't get things the way SHE wants them; c/o O2 since she can't breathe thru nose & we reviewed nasal hygiene, huidified O2, ENT re-eval; on Pred taper, off Levaquin now, Advair500Bid, Spiriva daily, NEBS w/ Xopenex Bid, Mucinex (not taking regularly & asked to do 2Bid + fluids); [facility] & recheck pt in 3 weeks... ~  4/16: she continues to be very frustrated w/ her disease & her care- can't breathe thru her nose & she feels the O2 is not doing her any good; we [facility] see if there is anything they can do.  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 ~  PFT 08/21/14 showing FVC=1.63 (50%), FEV1=0.63 (25%), %1sec=39;  FEV1 post bronchodil= 0.97 (39%) for a 50% improvement; DLCO was 22% predicted...  ~  CTChest 08/21/14  showed severe centrilobular emphysema, diffuse bronchial thickening, mild mucus plugging, no  adenopathy, no lung nodules etc; also showed mild Ao & coronary calcif, lap band at GE junction, splenosis, DJD Tspine. ~  Summer 2016: She had ENT & PULM evaluations at [facility], continued to see DrStephens (PulmFellow w/ DrHaponik)  ATYPICAL CHEST PAIN (ICD-786.50) - eval by DrWall in 2009. ~  baseline EKG w/ NSR, PVC's, NSSTTWA, NAD... ~  2DEcho 2/05 showed mild MR/ TR, norm LA/ RV, EF=50-55%... ~  NuclearStressTest 4/09 was norm- no ischemia, EF=55%... similiar to prev studies at [place] & 2007... ~  3/15: she notes some atypCP & requests a cardiac referral for eval- prev seen by DrWall, we will call for Cards consult per her request... ~  EKG 3/15 showed NSR, rate91, occas PVCs, otherw wnl... ~  4/15: she had Cards eval DrNishan> he did not feel that invasive testing was warranted; she had 2DEcho but refused Myoview due to co-pay costs...  ~  2DEcho 4/15 showed norm LV sys function w/ EF=60-65%, normal wall motion, Gr1DD, normal valves, normal RV & PA pressures... ~  EKG 2/16 showed NSR, rate88, low voltage, otherw wnl... ~  2DEcho 2/16 showed norm LVF w/ EF=55-60%, Gr1DD, norm valves and norm RV.  VENOUS INSUFFICIENCY, CHRONIC (ICD-459.81) - Hx chr ven insuffic w/ edema... on LASIX 103m- 1-2 daily (she self medicates)... ~  Labs 3/15 showed Cr=1.5 and she is asked to decr the Lasix to 453mQam...  IMPAIRED GLUCOSE TOLERANCE >>  METABOLIC SYNDROME X (ICZOX-096.0- she is on diet alone (refused Metformin therapy)... ~  labs 9/08 showed BS=120... FBS 5/08 was 93, HgA1c=7.1 ~  labs 5/10 showed BS= 182, A1c= 6.4 ~  labs 2/11 showed BS= 76, A1c= 6.5 ~  Labs 5/12 showed BS= 103, A1c= 6.5 ~  She saw DrEllison 4/13 w/ A1c=7.1 but she rejects the diagnosis of Diabetes & refused Metform50044m... ~  10/13:  We discussed IMPAIRED GLUCOSE TOLERANCE & need for low carb wt reducing diet and incr exercise... 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 ~  max weight ~340# before lap band surg 7/09 by DrMartin. ~  weight 12/09 = 291# ~  weight 5/10 = 265# ~  weight 11/10- = 254# ~  weight 2/11 = 260# ~  Weight 5/12= 290# ~  Weight 11/12 = 286# ~  Weight 4/13 = 280# => BMI=45 ~  Weight 10/13 = 289# ~  Weight 9/14 = 281# ~  Weight 3/15 = 270# ~  Weight 9/15 = 267# ~  Weight 12/15 = 274# ~  Weight 3/16 = 255# => post hosp... ~  Weight 4/16 = 271# ~  Weight 7/16 = 272# ~  Weight 11/16 = 276#  ? Hx HYPOTHYROIDISM (ICD-244.9) - off thyroid meds since 1/10... she was started on Synthroid and followed by DrRSmith in [place] & last seen 7/09- note reviewed... she refuses f/u by [name]- we will recheck TSH off Synthroid Rx... ~  labs 5/10 off Synthroid since 1/10 showed TSH= 1.13 ~  labs 2/11 showed TSH=  1.82 ~  Labs 5/12 showed TSH= 1.81 ~  2013: She wanted to restart Thyroid hormone but TSH remains normal> referred to Endocrine for further eval but she was upset w/ DrEllison's eval; she will decide if she wants to pursue another opinion... ~  9/14: not currently on meds but she really wants thyroid Rx; prev TFTs all wnl; she prev had Endocrine in [place] prescribe Synthroid for her but she stopped going; clinically & biochemically euthyroid...  ~  3/15: on Synthroid100-12 tab daily; Labs showed TSH= 3.00 ~  She stopped the Synthroid again... ~  Labs 2/16 not on meds showed TSH=0.39 & FreeT4=1.54   GERD (ICD-530.81) - prev on Nexium but off all meds  since 1/10... IRRITABLE BOWEL SYNDROME (ICD-564.1) - colonoscopy 1982 by DrPatterson was WNL... She has refused f/u GI eval & f/u colonoscopy...  ?KIDNEY STONE Hx >> pt said she passed a kidney stone 3/11 w/o any medical attention, w/o work up or proof of same; entry made here for future reference if needed.  UTERINE CANCER, HX OF (ICD-V10.42) - s/p laparoscopic hysterectomy & BSO 7/07 by DrSkinner at [place]...   DEGENERATIVE JOINT DISEASE (ICD-715.90) - on VICODIN tid prn,  ANAPROX 224m Bid prn, ULTRAM 53105mPrn... ? 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 she was on Wellbutrin & Vicodin when last seen in 2006... VITAMIN D DEFICIENCY (ICD-268.9) - Vit D level 5/10 = 16... rec> start OTC Vit D 2000 u daily. ~  9/14: on Vicodin, Tramadol, Aleve, VitD, etc; prev eval by DrDeveshwar for rheum; now c/o pain in left knee (she thinks it's "siatica"), can't exercise & wants to [facility]- ok... ~  3/15: she states that topical [name] ("horse linament") rubbed into knees really helps but she wants to keep the Vicodinon hand just in case...  Hx of HEADACHE (ICD-784.0)  DYSTHYMIA (ICD-300.4) - off Celexa,  off Zoloft, and on ALPRAZOLAM 0.105m6105md as needed... she has [place] psychiatry [place] the [place] w/ [place]... prev seeing psychologist [name] for eating disorder counselling... ~  4/13: she mentioned seeing [name] again for eating disorder & counseling for depression etc... 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  ~  3/15: she has the Alpraz0.105mg 67m prn use but seldom uses it; still sees [name] on occas but doesn't think she needs to continue... ~  11/15:  She wants antidepressant med &  we discussed Zoloft tria 100mg-42m => 1 tab daily... She still sees Psychologist [name]. ~  3/16:  She is off the Zoloft & states the AlprazSharin Graveo strong (advised cutting the med & try lower dose for the desired effect...  HEALTH MAINTENANCE:   ~  GI:  Per seen by DrPatterson but last colon was 1982 & she refuses follow up... ~  GYN:  [facility]  She had Pneumovax in the past; she declines Flu shots and Tetanus shots...   Past Surgical History:  Procedure Laterality Date  . APPENDECTOMY    . BILATERAL SALPINGOOPHORECTOMY  2007   [place]  . CHOLECYSTECTOMY  1982  . LAPAROSCOPIC GASTRIC BANDING  08/2007   Dr. MartinHassell DonePAROSCOPIC HYSTERECTOMY  2007   [place]  .  SPLENECTOMY  at [facility]   d/t trauma    Outpatient Encounter Prescriptions as of 04/09/2015  Medication Sig  . albuterol (PROVENTIL HFA;VENTOLIN HFA) 108 (90 BASE) MCG/ACT inhaler Inhale 2 puffs into the lungs every 6 (six) hours as needed for wheezing or shortness of breath.  . ALPRAZolam (XANAX) 0.5 MG tablet TAKE 1/2 TO 1 TABLET(S) BY MOUTH THREE TIMES DAILY AS NEEDED FOR NERVES. NOT TO EXCEED 3 TABS/DAY.  . B Complex-C-Folic Acid (B-COMPLEX BALANCED) TABS Take 1 tablet by mouth daily.  . budesonide-formoterol (SYMBICORT) 160-4.5 MCG/ACT inhaler Inhale 2 puffs into the lungs 2 (two) times daily.  . cetirizine (ZYRTEC) 10 MG tablet Take 10 mg by mouth daily.    . Cholecalciferol (VITAMIN D-3) 5000 UNITS TABS Take 1 tablet by mouth daily.  . Coconut Oil 1000 MG CAPS Take 4 capsules by mouth daily.  . cyclobenzaprine (FLEXERIL) 10 MG tablet Take 1 tablet (10 mg total) by mouth 3 (three) times daily as needed for muscle spasms.  . furosemide (LASIX) 40 MG tablet TAKE 1-2 TABLETS ONCE A DAY AS NEEDED FOR SWELLING  . Magnesium 400 MG CAPS Take 2 capsules by mouth daily.  . methylPREDNISolone (MEDROL) 4 MG tablet TAKE 1 TABLET (4 MG TOTAL) BY MOUTH DAILY. TAKE AS DIRECTED   . naproxen sodium (ANAPROX) 220 MG tablet Take 2 tabs by mouth once daily   . sertraline (ZOLOFT) 100 MG tablet TAKE 1/2 TABLET BY MOUTH DAILY  . SPIRIVA HANDIHALER 18 MCG inhalation capsule PLACE 1 CAPSULE (18 MCG TOTAL) INTO INHALER AND INHALE DAILY.  . traMADol (ULTRAM) 50 MG tablet TAKE 1 TABLET BY MOUTH  3 TIMES DAILY   AS NEEDED FOR PAIN  . vitamin C (ASCORBIC ACID) 250 MG tablet Take 250 mg by mouth daily.  Marland Kitchen allopurinol (ZYLOPRIM) 100 MG tablet Take 1 tablet (100 mg total) by mouth daily. (Patient not taking: Reported on 04/09/2015)  . colchicine 0.6 MG tablet Take 1 tablet (0.6 mg total) by mouth daily.  Marland Kitchen etodolac (LODINE XL) 400 MG 24 hr tablet Take 1 tablet (400 mg total) by mouth 2 (two) times daily as needed. (Patient not taking: Reported on 04/09/2015)  . levalbuterol (XOPENEX) 1.25 MG/3ML nebulizer solution Take 1.25 mg by nebulization 3 (three) times daily. (Patient not taking: Reported on 04/09/2015)  . traZODone (DESYREL) 100 MG tablet Take 0.5-1 tablets (50-100 mg total) by mouth at bedtime as needed for sleep.    Allergies  Allergen Reactions  . Penicillins     REACTION: nausea and vomiting    Current Medications, Allergies, Past Medical History, Past Surgical History, Family History, and Social History were reviewed in [employer] record.   Review of Systems:         See HPI - all other systems neg except as noted... The patient complains of dyspnea on exertion and difficulty walking; chest discomfort, & leg weakness.  The patient denies anorexia, fever, weight loss, weight gain, vision loss, decreased hearing, hoarseness, syncope, prolonged cough, headaches, hemoptysis, abdominal pain, melena, hematochezia, severe indigestion/heartburn, hematuria, incontinence, suspicious skin lesions, transient blindness, depression, unusual weight change, abnormal bleeding, enlarged lymph nodes, and angioedema.    Objective:   Physical Exam:    WD, Morbidly  Obese, 65 y/o WF in no acute distress but c/o dyspnea/ wheezing... GENERAL:  Alert & oriented; pleasant & cooperative... HEENT:  [place]/AT, EOM-wnl, PERRLA, EACs-clear, TMs-wnl, NOSE-clear, THROAT-clear & wnl. NECK:  Supple w/ fairROM; no JVD; normal carotid impulses w/o bruits;  no thyromegaly or nodules palpated; no lymphadenopathy. CHEST:  no accessory musc use, decr BS bilat, no wheezing/ rales/ rhonchi... HEART:  Regular Rhythm; without murmurs/ rubs/ or gallops heard... ABDOMEN:  Obese, soft & nontender; normal bowel sounds; no organomegaly or masses detected. EXT: without deformities, mod arthritic changes; no varicose veins/ +venous insuffic/trace edema. NEURO:  CNs intact; no focal neuro deficits... DERM: few ecchymoses, no rash etc...  RADIOLOGY DATA:  Reviewed in the EPIC EMR & discussed w/ the patient...  LABORATORY DATA:  Reviewed in the EPIC EMR & discussed w/ the patient...   Assessment & Plan:    ENT>> she was working w/ DrPlonk at [employer] in [place]...  OSA>  As prev noted she needs new sleep study & appropriate new CPAP apparatus to facilitate compliance but she declines the repeat study or sleep med referral...  COPD/ Emphysema>  Acute exac 2/16 improved after hosp in [place] w/ Levaquin, Pred taper, NEBS Bid, Avair500, Spiriva, Mucinex etc... Encouraged to stay on her O2 regularly & we will add Humidity, needs max nasal hygiene vs ENT follow up; continue NEBS/ Advair500/ Spiriva regularly & use the Mucinex; we will request the [facility] home Care home COPD program; needs to lose wt & incr exercise... 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 5/16>  She has seen ENT & Pulm 2nd opinion is pending; she cannot manipulate the port O2 tanks and we will request an INOGEN One- port O2 concentrator for her use...  5/16>  States breathing  worse, asked to use all regular meds regularly & we will add Depo120, Medrol taper... 6/16>  She had 2nd opinion consult w/ DrStephens/ DrHaponik at [facility] see above... 10/16>  Pt very frustrated after working w/ the [employer] division at [employer] for several months- Nebs, Advair500, Spiriva, Medrol, Oxygen, pulm rehab, etc...  11/16>  On Medrol63m- she incr herself to 671md, Symbicort160-2spBid, Spiriva daily, Xopenex NEBS Tid (only doing it once/d), Guaifenesin400- taking 6/d w/ fluids, O2- she is not using it at rest, using 5L/min w/ exercise; she takes Lasix40, Tamadol50, & Xanax0.5 as needed... 8/17>  She is now taking an internet supplement CBD oil (hemp oil) under her tongue daily- "it's good for your lungs" & she has weaned down her O2 and Medrol; Advised to continue O2- 2L/min at rest & 4L/min w/ exercise and take her other meds regularly...   Hx CP w/ neg cardiac evals including prev Myoviews; 2DEchos w/o incr PA pressures... 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 [facility]16> [facility] and norm RV...  OBESITY>  She had remote lap band surg by DrMMartin; Met syndrome, must get wt down etc; she is a lap band failure & prev counseling by [name]... 3/16> weight down to 255# post hosp... 4/16> weight is back up to 271# despite all efforts... 8/17> weight = 271#  ? Hx Hypothy>  TFTs remain normal off meds- no problem... 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  GI>  GERD> she denies symptoms & uses OTC meds prn...  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 C/O right ankle pain, eval by DrZSmith> no better on his  anti-inflamm rx so we will Rx w/ Depo120 & Medrol taper...  Dysthymia>  Much stress in her life, on Alpraz prn; has embraced alt lifestyle...   Patient's Medications  New Prescriptions   No medications on file  Previous Medications   ALPRAZOLAM (XANAX) 0.5 MG TABLET    TAKE 1/2 TO 1 TABLET(S) BY MOUTH THREE TIMES DAILY AS NEEDED FOR NERVES. NOT TO EXCEED 3 TABS/DAY.   B COMPLEX-C-FOLIC ACID (B-COMPLEX BALANCED) TABS    Take 1 tablet by mouth daily.   BUDESONIDE-FORMOTEROL (SYMBICORT) 160-4.5 MCG/ACT INHALER    Inhale 2 puffs into the lungs 2 (two) times daily.   CETIRIZINE (ZYRTEC) 10 MG TABLET    Take 10 mg by mouth daily.     CHOLECALCIFEROL (VITAMIN D-3) 5000 UNITS TABS    Take 1 tablet by mouth daily.   COCONUT OIL 1000 MG CAPS    Take 4 capsules by mouth daily.   COLCHICINE 0.6 MG TABLET    Take 1 tablet (0.6 mg total) by mouth daily.   CYCLOBENZAPRINE (FLEXERIL) 10 MG TABLET    Take 1 tablet (10 mg total) by mouth 3 (three) times daily as needed for muscle spasms.   FUROSEMIDE (LASIX) 40 MG TABLET    TAKE 1-2 TABLETS ONCE A DAY AS NEEDED FOR SWELLING   GUAIFENESIN (MUCINEX) 600 MG 12 HR TABLET    Take 1,600 mg by mouth 2 (two) times daily.   GUAIFENESIN-CODEINE (CHERATUSSIN AC) 100-10 MG/5ML SYRUP    Take 5 mLs by mouth every 4 (four) hours as needed for cough.   IPRATROPIUM-ALBUTEROL (DUONEB) 0.5-2.5 (3) MG/3ML SOLN    Take 3 mLs by nebulization 3 (three) times daily.   MAGNESIUM 400 MG CAPS    Take 2 capsules by mouth 2 (two) times daily.    METHYLPREDNISOLONE (MEDROL) 4 MG TABLET    Take 1 tablet by mouth twice daily, or as directed by Dr. Lenna Gilford   NAPROXEN SODIUM (ANAPROX) 220 MG TABLET    Take 2 tabs by mouth once daily    SERTRALINE (ZOLOFT) 100 MG TABLET    TAKE 1/2 TABLET BY MOUTH DAILY   SPIRIVA HANDIHALER 18 MCG INHALATION CAPSULE    PLACE 1 CAPSULE (18 MCG TOTAL) INTO INHALER AND INHALE DAILY.   TRAMADOL (ULTRAM) 50 MG TABLET    Take 1 tablet (50 mg total) by mouth 3 (three)  times daily as needed.   TRAZODONE (DESYREL) 100 MG TABLET    Take 0.5-1 tablets (50-100 mg total) by mouth at bedtime as needed for sleep.   TURMERIC 500 MG TABS    Take 1 tablet by mouth daily.   UNABLE TO FIND    Med Name: Guadlupe Spanish Tablets- takes 2 tablets qd   VENTOLIN HFA 108 (90 BASE) MCG/ACT INHALER    INHALE 2 PUFFS INTO THE LUNGS EVERY 6 (SIX) HOURS AS NEEDED FOR WHEEZING OR SHORTNESS OF BREATH.   VITAMIN C (ASCORBIC ACID) 250 MG TABLET    Take 250 mg by mouth daily.  Modified Medications   No medications on file  Discontinued Medications   LEVALBUTEROL (XOPENEX HFA) 45 MCG/ACT INHALER    Inhale 2 puffs into the lungs every 4 (four) hours as needed for wheezing.   LEVALBUTEROL (XOPENEX) 1.25 MG/3ML NEBULIZER SOLUTION  Take 1.25 mg by nebulization 3 (three) times daily.

## 2015-10-10 ENCOUNTER — Telehealth: Payer: Self-pay | Admitting: Pulmonary Disease

## 2015-10-10 NOTE — Telephone Encounter (Signed)
SN is aware of message.

## 2015-10-10 NOTE — Telephone Encounter (Signed)
Spoke with pt. States that she has a concern about her AVS instructions from yesterday. She says that SN put on the AVS to continue the slow medrol taper as directed by Dr. Tamala Julian. Per the pt, Dr. Tamala Julian is not involved with managing her medrol, only SN is. Pt does not require a call back but wants SN to be clear that he is the one who is instructing her how to decrease her medrol, NOT DR. Tamala Julian.

## 2015-10-20 ENCOUNTER — Other Ambulatory Visit: Payer: Self-pay | Admitting: Pulmonary Disease

## 2015-10-27 ENCOUNTER — Other Ambulatory Visit: Payer: Self-pay | Admitting: Pulmonary Disease

## 2015-10-30 ENCOUNTER — Other Ambulatory Visit: Payer: Self-pay | Admitting: Pulmonary Disease

## 2015-10-30 NOTE — Telephone Encounter (Signed)
I attempted to call pt on her home and cell numbers. Message has been left on both. Need to verify what pharmacy she would like this sent to.

## 2015-11-01 MED ORDER — FUROSEMIDE 40 MG PO TABS: ORAL_TABLET | ORAL | 11 refills | Status: DC

## 2015-11-01 NOTE — Telephone Encounter (Signed)
[facility] in [place], pt cb to [facility]

## 2015-11-01 NOTE — Telephone Encounter (Signed)
Spoke with pt and she stated that for the last 16 years [facility] [place].  rx has been sent in and then I spoke with the pharmacist to make them aware that the refill is good and has been sent in.  Nothing further is needed

## 2015-11-01 NOTE — Telephone Encounter (Signed)
lmomtcb x 2 for the pt.  

## 2015-12-06 ENCOUNTER — Encounter (HOSPITAL_COMMUNITY): Payer: Self-pay

## 2015-12-11 ENCOUNTER — Telehealth: Payer: Self-pay | Admitting: Pulmonary Disease

## 2015-12-11 NOTE — Telephone Encounter (Signed)
Pt aware of rec's per SN. Nothing further needed. 

## 2015-12-11 NOTE — Telephone Encounter (Signed)
Called and spoke with pt and she stated that the following:  She stated that she has been taking the mucinex ( generic form from [facility])  And this has helped her, but she has been taking 4000mg  daily.  I advised her to cut this back to 1200 mg BID, and she will cut this back.  She stated that she is on the rotation of medrol 4mg , 2mg , 4 mg, 2 mg.  She stated that she feels much better on the 2 mg days, says that she just feels better.  She stated that on the 4 mg days she is feeling terrible.  She has been on this dosing schedule for over 1 month and wanted to see about cutting this down even more.    Pt stated that she has been doing very well and her breathing has been doing fantastic at home.  She wanted to call and let SN know this and get these recs.  SN please advise. thanks

## 2015-12-11 NOTE — Telephone Encounter (Signed)
Per SN---  Ok to decrease the medrol to 2 mg daily.  And ok to cut back on the mucinex to 1200 mg  Bid with plenty of fluids.

## 2015-12-23 ENCOUNTER — Other Ambulatory Visit: Payer: Self-pay | Admitting: Pulmonary Disease

## 2015-12-31 ENCOUNTER — Telehealth (HOSPITAL_COMMUNITY): Payer: Self-pay

## 2015-12-31 NOTE — Telephone Encounter (Signed)
This patient is overdue for recommended follow-up with a bariatric surgeon at [facility]. A letter was mailed to the address on file 12/06/15 from both [employer] in attempt to reestablish post-op care. Letter has been returned to [employer] marked undeliverable, unable to forward. No additional address on file in [facility] or [employer]. Information was shared with Mallory Shirk today at [place] so she may contact the patient via phone in attempt to get the patient scheduled for an appointment in their office.

## 2016-01-15 ENCOUNTER — Telehealth: Payer: Self-pay | Admitting: Pulmonary Disease

## 2016-01-15 MED ORDER — BUDESONIDE-FORMOTEROL FUMARATE 160-4.5 MCG/ACT IN AERO
2.0000 | INHALATION_SPRAY | Freq: Two times a day (BID) | RESPIRATORY_TRACT | 6 refills | Status: DC
Start: 2016-01-15 — End: 2016-02-11

## 2016-01-15 NOTE — Telephone Encounter (Signed)
Pt states waiting at [facility], do not want to have to come back to pick up.contact#[name]

## 2016-01-15 NOTE — Telephone Encounter (Signed)
Spoke with pt, rx sent to preferred pharmacy.  Nothing further needed.

## 2016-02-06 ENCOUNTER — Other Ambulatory Visit: Payer: Self-pay | Admitting: Pulmonary Disease

## 2016-02-10 ENCOUNTER — Other Ambulatory Visit (INDEPENDENT_AMBULATORY_CARE_PROVIDER_SITE_OTHER): Payer: Medicare Other

## 2016-02-10 ENCOUNTER — Ambulatory Visit (INDEPENDENT_AMBULATORY_CARE_PROVIDER_SITE_OTHER): Payer: Medicare Other | Admitting: Pulmonary Disease

## 2016-02-10 ENCOUNTER — Encounter: Payer: Self-pay | Admitting: Pulmonary Disease

## 2016-02-10 VITALS — BP 118/72 | HR 72 | Temp 97.5°F | Ht 65.0 in | Wt 270.0 lb

## 2016-02-10 DIAGNOSIS — J449 Chronic obstructive pulmonary disease, unspecified: Secondary | ICD-10-CM

## 2016-02-10 DIAGNOSIS — J9611 Chronic respiratory failure with hypoxia: Secondary | ICD-10-CM | POA: Diagnosis not present

## 2016-02-10 DIAGNOSIS — G4733 Obstructive sleep apnea (adult) (pediatric): Secondary | ICD-10-CM

## 2016-02-10 LAB — BASIC METABOLIC PANEL
BUN: 25 mg/dL — ABNORMAL HIGH (ref 6–23)
CO2: 30 mEq/L (ref 19–32)
Calcium: 9.5 mg/dL (ref 8.4–10.5)
Chloride: 103 mEq/L (ref 96–112)
Creatinine, Ser: 1.24 mg/dL — ABNORMAL HIGH (ref 0.40–1.20)
GFR: 45.81 mL/min — ABNORMAL LOW (ref >60.00)
GLUCOSE: 105 mg/dL — AB (ref 70–99)
Potassium: 4.5 mEq/L (ref 3.5–5.1)
SODIUM: 143 meq/L (ref 135–145)

## 2016-02-10 MED ORDER — ALPRAZOLAM 0.5 MG PO TABS: ORAL_TABLET | ORAL | 5 refills | Status: DC

## 2016-02-10 MED ORDER — FUROSEMIDE 40 MG PO TABS: ORAL_TABLET | ORAL | 3 refills | Status: DC

## 2016-02-10 MED ORDER — IPRATROPIUM-ALBUTEROL 0.5-2.5 (3) MG/3ML IN SOLN: 3.0000 mL | Freq: Three times a day (TID) | RESPIRATORY_TRACT | 7 refills | Status: DC

## 2016-02-10 MED ORDER — ALBUTEROL SULFATE HFA 108 (90 BASE) MCG/ACT IN AERS: INHALATION_SPRAY | RESPIRATORY_TRACT | 5 refills | Status: DC

## 2016-02-10 MED ORDER — COLCHICINE 0.6 MG PO TABS: 0.6000 mg | ORAL_TABLET | Freq: Every day | ORAL | 11 refills | Status: DC

## 2016-02-10 NOTE — Patient Instructions (Signed)
Today we updated your med list in our EPIC system...    Continue your current medications the same...  We refilled your meds for 30d supplies- refillable for 41yr...  Let me know how your [facility] exercise program is doing for you...  I will send a note to [facility] regarding the need for 26 portable tanks/ month...  Call for any questions...  Let's plan a follow up visit in 107mo, sooner if needed for problems.Marland KitchenMarland Kitchen

## 2016-02-10 NOTE — Progress Notes (Signed)
HPI  Review of Systems  Physical Exam  Patient ID: Rebecca [name], female   DOB: [birth year], 65 y.o.   MRN: [id number] Subjective:   HPI 64 y/o WF known to me w/ mult med problems as noted below...  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  LABS 3/15:  Chems- BS=95 A1c=6.8 (needs low carb diet, exercise, wt loss) & Creat=1.5 (mild renal insuffic due to Lasix rx; Rec to decrease Lasix40 to 1 tab Qam...  2DEcho 4/15 showed norm LV sys function w/ EF=60-65%, normal wall motion, Gr1DD, normal valves, normal RV & PA pressures...   she had Cards eval Rebecca [name] 4/15> he did not feel that invasive testing was warranted; she had 2DEcho but refused Myoview due to co-pay costs...   She remains on ADVAIR500Bid, SPIRIVA daily, & ProairHFA prn (uses 3-4 x daily "It really helps"); has Home O2 but dislikes the Liq O2 therefore not using & she wants a portable O2 concentrator to read 4L/min w/ exercise, 2L/min at rest.   She has continued to research her supplements and herbal meds>  She tells me that she is living on smoothies she makes out of juice, ice, baking soda, pomegranate syrup, & local honey;  Tumeric helps the arthritis in her knees and she notes that pinching her upper lip in the middle under her nose helps relieve musc cramps in her legs.  Ambulatory O2 Test 9/15>  O2 sat on RA at rest= 92% w/ pulse=86;  Nadir O2sat on RA after 2 laps= 84% w/ pulse=96...   CXR 9/15 showed COPD/emphysema, no acute abnormality...  LABS 9/15:  Chems- ok w/ BS=119, A1c=6.7, Cr=1.4;  CBC- ok...  2/16 [facility] records indicated a thorough evaluation & excellent care provided but treatment plan was hampered by noncompliance- refused CPAP, declines chestPT, alternately stopping her O2 & demanding more O2,   LABS 2/16:  ABG- pH=7.46, pCO2=35, pO2=134 on 6L/min; Chems- ok x BS=169, A1c=6.5;  CBC- normal; TSH=0.39 & FreeT4=1.54; Troponin/ D-dimer/ BNP- all wnl...  CXR 2/16 showed heart at upper lim of norm, hyperinflation, min bibasilar atx, NAD.Marland KitchenMarland Kitchen   EKG 2/16 showed NSR, rate88, low voltage, otherw wnl...  2DEcho 2/16 showed norm LVF w/ EF=55-60%, Gr1DD, norm valves and norm RV...   Last CXR 2/16 showed heart at upper lim of norm, hyperinflation, min bibasilar atx, NAD  Spirometry 06/2014 showed FVC=1.61 (50%), FEV1=0.88 (35%), %1sec=55, mi-flows are reduced to 18% predicted... 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  LABS 5/16:  Chems- wnl w/ BS=90, Cr=1.02;  Mg=2.2, Ca=9.0;  Uric=6.6.Marland KitchenMarland Kitchen   ~  September 04, 2014:  6wk ROV & Rebecca [name] has had ENT & Pulm evaluations at [facility] per our requests>>      ENT eval by DrPlonk at [facility] & his notes are reviewed> HxCOPD w/ revers component and chr nasal obstruction L>R, nasal crusting, bilat inferior nasal turbinate hypertrophy, etc;  They rec nasal saline, nasal steroid spray, mupirocin ointment, and breathe right nasal strips; they will consider surg if not improved...       Pulm second opinion consult 08/14/14 by Dr Germain Osgood & DrHaponik at [facility] COPD-severe centrilobular emphysema/asthma overlap, ex-smoker quit 1993; chr hypoxemic resp failure on home O2, OSA not on CPAP; c/o dyspnea & can't breathe thru her nose (causing her to not use her oxygen), morbid obesity w/ prev lap band  surg (unsuccessful in losing weight); on Medrol8, Advair500Bid, Spiriva daily, Nebs w/ xopenex & AlbutHFA prn; pt notes that Lasix80 helps her breathe;  Lung exam was clear;  They did PFT 08/21/14 showing FVC=1.63 (50%), FEV1=0.63 (25%), %1sec=39;  FEV1 post bronchodil= 0.97 (39%) for a 50% improvement; DLCO was 22% predicted... CTChest 08/21/14 showed severe centrilobular emphysema, diffuse bronchial thickening, mild mucus plugging, no adenopathy, no lung nodules etc; also showed mild Ao & coronary calcif, lap band at GE junction, splenosis, DJD Tspine... PLAN> They  decided to slowly wean the Medrol, continue Advair & wean to Advair250Bid, continue Spiriva, continue NEBS & AlbutHFA; rec to continue her oxygen (but she continues to use it "prn"), consider repeat sleep study & CPAP titration, consider referral to [place] rehab, referred for nutritional counseling & ROV 652mo..      She reports breathing well recently, she has a new roommate, eating out a lot, went to "theclub", then one day over the weekend=> incr SOB, she's been taking Lasix80 Qam, rambling hx w/ flight of ideas regarding coffee, caffeine, not urinating enough, she had [place] [facility] [place];  They decreased her Advair250-Bid, Medrol666md, continue Nebs (she's using Bid because she "cramps up" if she does it tid); she is awaiting nutritional counseling, pulm rehab referral, oxygen titration testing, sleep study...  We reviewed prob list, meds, xrays and labs> see below for updates >>  PLAN>>  She will maintain close contact & f/u w/ [facility];  Reminded to avoid sodium & carbs- handout given; plan ROV here in 2-55m68mo  ~  December 05, 2014:  55mo60mo & Rebecca's new PCP is in [place]> she persists w/ 4+somatic complaints and signif health related anxiety; [facility] "they kicked me out due to CP- I can't go back until I get this evaluated"; she's been seeing Dr. SaraGermain Osgoodlm Fellow [facility] & I have reviewed all the Care Everywhere notes-- several telephone encounters including their last 45 min conversation well documented, not much reversibility, they hoped for small improvement w/ Rehab exercise & staying on meds but she won't fill rx, can't afford, tearful/ frustrated/ depressed but refused psyche consult etc... Pt indicates that DrStephens said there was nothing wrong w/ her heart & pt doesn't want cardiac eval; DrStephens wants her to wean the Medrol but she's breathing better on 4mg/71mshe  wants 6mg/d8mrec to compromise w/ 6mg al28m/ 4mg Qod76mhe wants a liq oxygen system again- she is quite adamant (being a former "nurse")Insurance claims handlershe needs 4-5L/min when active and tank only lasts 1-2H; she uses 4L/min Qhs as well; but O2sat=92% on RA at rest today; she refuses to go back to rehab, therefore encouraged to do it daily on her own; she has not pursued further bariatric considerations- she is working w/ Drlori's office regarding eating disorder "he doesn't think I'm depressed"...      IMP/PLAN>>  Rebecca has Rebecca Delayere airflow obstruction/ emphysema w/ little reversibility; Rec to take SYMBICORT160-2spBid & Spiriva daily; use Medrol 4mg tabs76mmg alt w7mmg Qod; X61mnex NEBS Tid;  Alprax 0.5mg - 1/2 t58m tab tid; ROV w/ me in 52mo...  ~  N36mober 7, 2016:  52mo ROV & pt 58momult complaints> head "stopped-up", ears congested, tried pseudophed but "it made me sick", doesn't want ENT f/u, rec to try Saline nasal mist & phenylephrine decongestant;  She  is discouraged by her weight, can't seem to lose any, she described in detail her meals from yest (we discussed calories "in" & "out", eat less, burn more), she says local Gym won't take her w/ oxygen;  She asked about Stem Cell treatment for COPD- I offered to send her to [facility] to inquire about this or lung transplant;  She is very frustrated- eating is an issue because when she fills up she can't breathe well due to her prev LapBand surg she says, if she belches she feels better...       Rec to continue Medrol25m- she incr herself to 658md, Symbicort160-2spBid, Spiriva daily, Xopenex NEBS Tid (only doing it once/d), Guaifenesin400- taking 6/d w/ fluids, O2- she is not using it at rest, using 5L/min w/ exercise; she takes Lasix40, Tamadol50, & Xanax0.5 as needed...   LABS 01/2015>  Chems- ok x Cr=1.52, BS=114;  CBC- ok x wbc=17K w/ left shift;  TSH=1.17;  Fe=78 (22%sat);  VitB12=864...   XRay [facility] spine 01/2015>  Mild ant wedge compression L1, DDD &  anterolithesis L4 on L5, poss right kidney stones...  ~  April 09, 2015:  54m22moV & Rebecca continues to complain about ever worsening breathing- DOE, dry cough, chest tightness, leg cramps, difficulty sleeping; notes good days and bad- "I got worked up over the election" she says; she thinks the zoloft is helping but she can't sleep w/ it- rec to try TRAZODONE instead (100m64m/2 to 1 tab Qhs);  Currently taking Oxygen that she adjusts herself, Medrol4mg/37mNEBS w/ Xopenex Tid (but she says insurance won't cover it), Symbicort160-2spBid, Spiriva daily, Lasix40 (just taking prn)... I suggested that she try the Oximizer=> given to pt to try...    See 08/2014 note above for summary of [facility] at [facility]  She saw CARDS at [facility]> reviewed in [facility] w/ NSR, wnl, NAD; prev 2DEcho 04/2014 showed norm LVF w/ EF=55-60%, norm wall motion, G1DD, RVF was also wnl; she [facility] chest [facility] but pt never followed thru due to dyspnea & O2 requirements; they also considered RHC but she never returned as requested...    She has seen DrZSmith for SportsMed/ PM&R> c/o back pain & leg pain, his note is reviewed... EXAM shows Afeb, VSS, O2sat=91% on RA in office;  HEENT- nasal congestion, erythema, turbinate hypertrophy, dev septum, throat= Mallampati 2, sl red, no exudates or lesions;  Chest- decr BS bilat w/o w/r/r & no signs of consolidation;  Heart- RR w/o m/r/g;  Abd- Obese, panniculus, soft/ nontender;  Ext- VI w/ trace edema;  Psyche- very distraught...  Ambulatory Oximetry on 3L/min [place]>  O2sat=96% on 3L/min at rest w/ HR=107/min;  She ambulated 1 Lap on 3L/min w/ nadir O2sat=89% w/ HR=125/min...  Ambulatory Oximetry on 3L/min w/ pendant oxymizer>  O2sat=99% on 3L/min at rest w/ pulse=98/min;  She ambulated one lap & stopped due to dyspnea w/ nadir O2sat=91% w/ HR=123/min... IMP/PLAN>>  She will try the Oximizer at home, switch from  zoloft to TRAZODONE 100mg-36m to 1 tab Qhs, try to wean MEDROL 4mg is31mle to 4mg Qod79m increments; rov in 3 months time...  ~  Jul 09, 2015:  54mo ROV 40mol continues to complain about everything- productive cough w/ lots of clear sput, chr stable DOE w/ min activ, denies CP- says she's doing well w/ her NEB Rx;  After he last visit w/ prescribed an Oximizer but she  says "no help at all"; we tried Trazodone100 Qhs for sleep but she tells me she didn't need it; we discussed weaning down the dose of Medrol (she was on 69m/d) and she was able to diminish this to 464mod but symptoms flaired & she went right back up to 84m69mam again; she is taking gen Guaifenesin 400m40mbs- 8/d in divided doses + one gal fluids daily- this helps her cough & congestion;  Offered referral to [facility] for a 3rd opinion regarding her COPD and poss transplant eval but says she was told she had to lose 100# first ...  She wants a thorough THYROID & pituitary eval w/ "reverse T3" checked=> we will refer to Endocrine;  She says her hands are cramping- asked to try soaking hands in hot water but she has several reasons why she can't get to the sink...     Hx mild OSA w/ mod desat, loud snoring, & leg jerks (on sleep study 2005)> she tried CPAP10 but stopped on her own & declined to repeat split night study & re-try CPAP rx; she says she's sleeping satis & wakes feeling refreshed...    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 1993; supposed to be on HomeO2- 2L rest & 4L exercise, NEBS w/ Duoneb vs Xopenex Tid, Symbicort160-2spBid, Spiriva daily, Mucinex1-2Bid but not taking anything regularly; states that Flutter causes "spasms" in her back; states O2 doesn't help since she can't breathe thru her nose (she has been eval by ENT, CrosErnesto Rutherfordinally tried [facility] stopped after 15mo 50mofruitless"; prev stated she "can't breathe at all, can't do anything" etc; SOB w/ ADLs etc; Note> [id number] [place]- last PFT (2011)  showed FEV1= 1.5 (55%) & %1sec=59 ==> 3/15 she announced that she's been cured by taking a Magnesium supplement, along w/ herbal formula "Clear lungs" and "Lung tonic"; she had stopped her Oxygen;  Now using O2 up to 15L/min on her own since she can't breathe thru her nose- advised 2L/min rest & 4L/min exercise + humidity, nasal hygiene, etc; on MEDROL4-84mg/d31m  Hx AtypCP> baseline EKG w/ minor NSSTTWA; 2DEcho w/ norm LVF, EF=55-60%, Gr1DD, norm valves, norm LA/RV; Myoview 2009 normal w/o ischemia & EF=55%; she notes some atypCP & saw DrNishCherly [name] she declined Myoview; [facility] [place]'d had cardiac eval- she declined...    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    Marland KitchenMarland Kitchenypothyroid> not currently on meds but she really wants thyroid Rx; prev TFTs all wnl; she prev had Endocrine in [place] prescribe Synthroid for her but she stopped going; clinically & biochemically euthyroid; Labs 2/16 showed TSH=0.39 & FreeT4=1.54    Morbid Obesity> peak wt~340# before lap band surg 2009 by DrMMartin; lost to ~250# but then back up to 280-90#; refused f/u w/ [facility] due to cost of visits & lap-band adjustments; we reviewed diet & exercise- Wt betw 255-265#in last yr.    GI- GERD, IBS> she uses OTC PPI as needed; last colonoscopy was 1982 by DrPatterson & wnl, she refuses GI referral & refuses f/u colon etc...    GYN- s/p uterine  cancer> s/p lap hyst & BSO [facility]Skinner [facility]...    DJD, ?FM, VitD defic> prev on Vicodin, Tramadol, Aleve, VitD; prev eval by DrDeveshwar for Rheum; now she states that [name] ("horse linament") applied topically has  worked like a miracle & she has increased exercise etc...    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 EXAM shows Afeb, VSS, O2sat=91% on RA in office;  HEENT- nasal congestion, erythema, turbinate hypertrophy, dev septum, throat= Mallampati 2, sl red, no exudates or lesions;  Chest- decr BS bilat w/o w/r/r & no signs of consolidation;  Heart- RR w/o m/r/g;  Abd- Obese, panniculus, soft/ nontender;  Ext- VI w/ trace edema;  Psyche- very distraught...  SHE NEEDS F/U LABS IMP/PLAN>>  GArtis Delaywants an Endocrine eval & we will refer to endocrinologist;  Needs f/u labs but she wants the endocrine eval;  Advised to try to wean the Medrol to 457mQam;  She must lose the weight & then reassess & consider referral to [facility];  We [plan ROV in 24m72moeminded to bring al mmed bottles to every office visit.  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    She has mult somatic complaints> "I have a weak back"; now on a diet- eating 1 meal/d & DrLurie is concerned that she isn't eating enough (wt up 5# to 271#); she says she has done her internet research & found a blogger who has COPD & improved w/ CBD oil (hemp oil) "it's pharmaceutical grade out of [place] contain any THC"; she places 20mg unde80mr tongue & it's supposed to help her lungs- says it's working as she has been able to wean down her oxygen she says "I'm keeping a diary"...    She saw [name] ENT 10/02/15> on CPAP, nasal septal deviation, cerumen impactions removed...     We reviewed her prescription meds>  InsHovnanian Enterprisescover xopenex/ levalbuterol; on Medrol 4mg- weane324merself down to 4-2-4-2 Qod, Symbicort160-2spBid, Spiriva daily, NEBS w/ Duoneb (but she has cut down to just once daily), Ventolin-HFA prn (she likes this & uses it 3-4x daily), Mucinex 600mg- uses 524m as needed EXAM shows Afeb, VSS, O2sat=88% on RA in office;  Wt=271#; HEENT- nasal congestion, erythema, turbinate hypertrophy, dev septum, Throat= Mallampati 2, sl red, no exudates or lesions;  Chest- decr  BS bilat w/o w/r/r & no signs of consolidation;  Heart- RR w/o m/r/g;  Abd- Obese, panniculus, soft/ nontender;  Ext- VI w/ trace edema;  Psyche- remains distraught... IMP/PLAN>>  She is excited about the CBD oil Rx off the internet & has weaned down her O2 and Medrol;  I pointed out her resting RA O2sat=88% today in the office 7 reminded to stay on her O2 regularly 2L/nmin rest 7 4L/min w/ exercise;  She has weaned her Medrol to 41m alt w/ 262mqod and anxious to go lower;  Reminded to stay on her regular med regimen all the time (see above)...   ~  February 10, 2016:  25m825moV & GilArtis Delayturns w/ mult somatic complaints- worse SOB but then tells me that her breathing is much better due to a special oil she is using, "I read on-line about NOT increasing oxygen when you are breathless", c/o  decr hearing "it's coming from inside my head" & [facility]; [place] 7 we discussed the role of her hi flow O2- rec SALINE nasal mist frequently; pt called us [place]17 & said that her breathing was doing fantastic on Medrol reduced to 2mg625m, since then she has weaned OFF the Medrol on her own & notes "my lungs are dry & clear" and  "I solved my oxygen prob w/ long nasal prongs and saline spray"... HER CC TODAY IS A PROB w/ [facility] AS THEY WON'T SUPPLY HER w/ E-TANKS LIKE SHE WANTS THEM; we reviewed the following medical problems during today's office visit >>     Hx mild OSA w/ mod desat, loud snoring, & leg jerks (on sleep study 2005)> she tried CPAP10 but stopped on her own & declined to repeat split night study & re-try CPAP rx; she says she's sleeping satis & wakes feeling refreshed, does not want to re-assess her sleep...    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 1993; supposed to be on HomeO2- 2L rest & 4L exercise, NEBS w/ Duoneb Tid, Symbicort160-2spBid, Spiriva daily, GFN400- encouraged to use 2Tid w/ fluids; she stopped Medrol on her own recently; states that Flutter causes "spasms" in her back; states O2 doesn't help since she can't breathe thru her nose (she has [facility] finally tried PulmRehab but stopped after 26mo 49mofruitless"; prev stated she "can't breathe at all, can't do anything" etc; SOB w/ ADLs etc; Note> [facility]- last PFT (2011) showed FEV1= 1.5 (55%) & %1sec=59 ==> 3/15 she announced that she's been cured by taking a Magnesium supplement, along w/ herbal formula "Clear lungs" and "Lung tonic"; she had stopped her Oxygen;  Now using O2 up to 15L/min on her own since she can't breathe thru her nose- advised 2L/min rest & 4L/min exercise + humidity, nasal hygiene, etc...     Hx AtypCP> baseline EKG w/ minor NSSTTWA; 2DEcho w/ norm LVF, EF=55-60%, Gr1DD, norm  valves, norm LA/RV; Myoview 2009 normal w/o ischemia & EF=55%; she notes some atypCP & saw DrNisCherly [name], she declined Myoview; [facility] [place] until she'd had cardiac eval- she declined...    Ven Insuffic> on Lasix40 (1or2 prn edema); she knows not to eat sodium, elev legs, wear support hose; Labs 2/16 showed Cr=1.2    Impaired Gluc Tolerance, Metabolic syndrome> she refused meds; prev eval by  DrEllison w/ rec for Metformin but she rejects DM diagnosis & refused meds; states "I'm eating healthier" (advised low carb low fat); Labs 2/16 showed BS~160, A1c=6.5.Marland KitchenMarland Kitchen    ?Hypothyroid> now followed by Dr.Smith in [place]; she is not on thyroid medication but she says he wants her off the Medrol so she weaned off this med on her own; prev TFTs all wnl; she prev had Endocrine in [place] prescribe Synthroid for her but she stopped going; clinically & biochemically euthyroid; Labs 2/16 showed TSH=0.39 & FreeT4=1.54    Morbid Obesity> peak wt~340# before lap band surg 2009 by DrMMartin; lost to ~250# but then back up to 280-90#; refused f/u w/ [facility] due to cost of visits & lap-band adjustments; we reviewed diet & exercise- Wt betw 255-265# in 2017...    GI- GERD, IBS> she uses OTC PPI as needed; last colonoscopy was 1982 by DrPatterson & wnl, she refuses GI referral & refuses f/u colon etc...    GYN- s/p uterine cancer> s/p lap hyst & [facility]SO [facility]...    DJD, ?FM, VitD defic> prev on Vicodin, Tramadol, Aleve, VitD; prev eval by DrDeveshwar for Rheum; now she states that [name] ("horse linament") applied topically has worked like a miracle & she has increased exercise etc...    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 EXAM shows Afeb, VSS, O2sat=93% on RA in  office;  HEENT- sl nasal congestion, erythema, turbinate hypertrophy, dev septum, throat= Mallampati 2, sl red, no exudates or lesions;  Chest- decr BS bilat w/o w/r/r & no signs of consolidation;  Heart- RR w/o m/r/g;  Abd- Obese, panniculus, soft/ nontender;  Ext- VI w/ trace edema...  LABS 02/10/16>  Chems- ok w/ BS=105, BUN=25, Cr=1.24 IMP/PLAN>>  Overall GArtis Delayis stable- she requests 90d refill prescriptions and we did this;  We reviewed her labs, XRays, & PFTs- rec to take meds regularly & not adjust on her own, rec to return to exercise program & consider pulm rehab; note: >50% of this 25 min f/u appt was spent on counseling & coordination of care...            Problem List:    OBSTRUCTIVE SLEEP APNEA (ICD-327.23) - sleep study 2005 showed RDI=15 w/ desat to 78%, loud snoring & +leg jerks> on CPAP 10, prev used intermittently but now not at all... ~  11/12:  She has agreed to a new Sleep Study- split night protocol, to recheck her OSA & determine CPAP needs (check ABG if poss too)==> she never followed thru w/ the study. ~  Pt states that she is resting satis and wakes refreshed, energy better... ~  12/15: she reports not resting due to left shoulder/ arm pain & we will refer to Ortho (she saw Dr. ZGardenia Phlegm... ~  She continues to refuse repeat sleep study, CPAP, and declines f/u w/ sleep med...  [facility] & [facility]> he is working to get her nares open to facilitate her breathing & nasal O2 Rx...  ~  Spring 2016> ENT eval by DrPlonk & his notes are reviewed> HxCOPD w/ revers component and chr nasal obstruction L>R, nasal crusting, bilat inferior nasal turbinate hypertrophy, etc;  They rec nasal saline, nasal steroid spray, mupirocin ointment, and breathe right nasal strips; they will consider surg  if not improved...   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 supposed to be on NEBULIZER Qid, ADVAIR 500Bid +  SPIRIVA daily (compliance is a serious issue- she freq stops all meds) ==> she had a second opinion consult at [facility] by DrAdair in 2006... ~  A1AT level is normal 218 (83-200) 3/09... ~  baseline CXR w/ borderline Cor, COPD, interstitial scarring/ atelectasis/ obesity... ~  PFTs 3/07 showed FVC=2.74 (82%), FEV1=1.67 (62%), %1sec=61, mid-flows=27%pred... improved from 2005. ~  CTChest in 2007 & 4/09 showed severe centrilob emyphysema, + interstitial fibrosis, sm HH, left renal cyst... neg for PE... ~  2010:  breathing improved w/ weight reduction after Lap-band surg... ~  2/11: COPD exac w/ neg CXR (chr changes, NAD), Rx w/ Depo/ Pred/ Avelox/ Mucinex/ NEBS/ Advair/ Spiriva/ etc... ~  PFTs 3/11 showed FVC= 2.57 (73%), FEV1= 1.51 (55%), %1sec=59, mid-flows= 21%pred. ~  Noncompliant w/ Rx & O2 thru 2012... On disability since 2011 & finally got Medicare coverage 9/12... ~  11/12: presents w/ worsening breathing & dyspnea w/ ADLs ==> we outlined further eval w/ 2DEcho, Sleep Study; Rx w/ NEBS QID, Advair500Bid, Spiriva daily, Mucinex, Oxygen, Lasix, Zoloft, Xanax, etc (she declined to proceed w/ the sleep study, 2DEcho, etc)... 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  ~  4/13: she is c/o the nasal O2 not helping since it's hard to breathe thru nose & wants ENT referral==> saw [name] who wanted to do surg but she has severe COPD & hi risk; asked to start PulmRehab & she agrees... ~  10/13: she did PulmRehab for 66mo6-7/13 but quit since it was "fruitless"- barely got to do any exercise since it was so difficult getting to the facility,etc; still c/o nasal problems limiting her benefit from O2 & we discussed the poss of ?transtracheal oxygen? ~  9/14: exsmoker quit 1993; supposed to be on HomeO2- 2L rest & 4L exercise, NEBS w/ Albut, Advair500Bid, Spiriva daily, Mucinex1-2Bid but not taking anything regularly; states that  Flutter & Mucinex cause "spasms" in her back; states O2 doesn't help since she can't breathe thru her nose (she has been eval by ENT, [facility]; [facility]"; states she "can't breathe at all, can't do anything" etc; SOB w/ ADLs=> O2 recert today (see below)... Note> [facility] and last PFT (2011) showed FEV1= 1.5 (55%) & %1sec=59... OXYGEN RE-CERT done today... ~  3/15: see above- pt states that a magnesium supplement along w/ herbal supplements "clear lungs" and "lung tonic" have made all the difference & she no longer needs oxygen, exercising regularly & much improved; still taking Advair500, Spiriva, AlbutHFA prn...  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 ~  CXR 9/15 showed COPD/emphysema, no acute abnormality... ~  11/07/13: Ambulatory O2 Test 9/15> O2 sat on RA at rest= 92% w/ pulse=86;  Nadir O2sat on RA after 2 laps= 84% w/ pulse=96...  ~  11/17/13: she presented w/ acute SOB, wheezing, COPD exac> treated w/ NEBS, Depo120, Pred taper, Mucinex1200Bid, fluids, & continue the Spiriva & Oxygen; ch her Advair to NEBS w/ Xopenex & Budesonide regularly... ~  10/15: she is much improved w/ Pred taper but she wants off; reminded to use NEBS w/ Xopenex & Budes Bid every day... ~  12/15: she is  off the Pred, but also stopped the Xopenex/ Budesonide due to cost; she has gone back on her Advair500 & Spiriva, plus Albut via NEBS, Mucinex/ Fluids/ etc... ~  [facility] 2/16 by Gaylyn Cheers w/ COPD exac> disch on Oxygen, Pred taper, Levaquin750, NEBS w/ Xopenex, Advair500, Spiriva, Mucinex, etc... ~  3/16: post hosp check, very frustrated that she can't get things the way SHE wants them; c/o O2 since she can't breathe thru nose & we reviewed nasal hygiene, huidified O2, ENT re-eval; on Pred taper, off Levaquin now, Advair500Bid, Spiriva daily, NEBS w/ Xopenex  Bid, Mucinex (not taking regularly & asked to do 2Bid + fluids); we will try [facility] COPD program & recheck pt in 3 weeks... ~  4/16: she continues to be very frustrated w/ her disease & her care- can't breathe thru her nose & she feels the O2 is not doing her any good; we discussed refer to [facility] for their review to see if there is anything they can do.  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 ~  PFT 08/21/14 showing FVC=1.63 (50%), FEV1=0.63 (25%), %1sec=39;  FEV1 post bronchodil= 0.97 (39%) for a 50% improvement; DLCO was 22% predicted...  ~  CTChest 08/21/14 showed severe centrilobular emphysema, diffuse bronchial thickening, mild mucus plugging, no adenopathy, no lung nodules etc; also showed mild Ao & coronary calcif, lap band at GE junction, splenosis, DJD Tspine. ~  Summer 2016: She had ENT & PULM evaluations at [facility], continued to see DrStephens (PulmFellow w/ DrHaponik)  ATYPICAL CHEST PAIN (ICD-786.50) - eval by DrWall in 2009. ~  baseline EKG w/ NSR, PVC's, NSSTTWA, NAD... ~  2DEcho 2/05 showed mild MR/ TR, norm LA/ RV, EF=50-55%... ~  NuclearStressTest 4/09 was norm- no ischemia, EF=55%... similiar to prev studies at [place] & 2007... ~  3/15: she notes some atypCP & requests a cardiac referral for eval- prev seen by DrWall, we will call for Cards consult per her request... ~  EKG 3/15 showed NSR, rate91, occas PVCs, otherw wnl... ~  4/15: she had Cards eval DrNishan> he did not feel that invasive testing was warranted; she had 2DEcho but refused Myoview due to co-pay costs...  ~  2DEcho 4/15 showed norm LV sys function w/ EF=60-65%, normal wall  motion, Gr1DD, normal valves, normal RV & PA pressures... ~  EKG 2/16 showed NSR, rate88, low voltage, otherw wnl... ~  2DEcho 2/16 showed norm LVF w/ EF=55-60%, Gr1DD, norm valves and norm RV.  VENOUS INSUFFICIENCY, CHRONIC (ICD-459.81) - Hx chr ven insuffic w/ edema... on LASIX 57m- 1-2 daily (she self medicates)... ~  Labs 3/15 showed Cr=1.5 and she is asked to decr the Lasix to 430mQam...  IMPAIRED GLUCOSE TOLERANCE >>  METABOLIC SYNDROME X (ICGYF-749.4- she is on diet alone (refused Metformin therapy)... ~  labs 9/08 showed BS=120... FBS 5/08 was 93, HgA1c=7.1 ~  labs 5/10 showed BS= 182, A1c= 6.4 ~  labs 2/11 showed BS= 76, A1c= 6.5 ~  Labs 5/12 showed BS= 103, A1c= 6.5 ~  She saw DrEllison 4/13 w/ A1c=7.1 but she rejects the diagnosis of Diabetes & refused Metform50028m... ~  10/13:  We discussed  IMPAIRED GLUCOSE TOLERANCE & need for low carb wt reducing diet and incr exercise... 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 ~  max weight ~340# before lap band surg 7/09 by DrMartin. ~  weight 12/09 = 291# ~  weight 5/10 = 265# ~  weight 11/10- = 254# ~  weight 2/11 = 260# ~  Weight 5/12= 290# ~  Weight 11/12 = 286# ~  Weight 4/13 = 280# => BMI=45 ~  Weight 10/13 = 289# ~  Weight 9/14 = 281# ~  Weight 3/15 = 270# ~  Weight 9/15 = 267# ~  Weight 12/15 = 274# ~  Weight 3/16 = 255# => post hosp... ~  Weight 4/16 = 271# ~  Weight 7/16 = 272# ~  Weight 11/16 = 276#  ? Hx HYPOTHYROIDISM (ICD-244.9) - off thyroid meds since  1/10... she was started on Synthroid and followed by DrRSmith in [place] & last seen 7/09- note reviewed... she refuses f/u by [name]- we will recheck TSH off Synthroid Rx... ~  labs 5/10 off Synthroid since 1/10 showed TSH= 1.13 ~  labs 2/11 showed TSH= 1.82 ~  Labs 5/12 showed TSH= 1.81 ~  2013: She wanted to restart Thyroid hormone but TSH remains normal> referred to Endocrine for further eval but she was upset w/ DrEllison's eval; she will decide if she wants to pursue another opinion... ~  9/14: not currently on meds but she really wants thyroid Rx; prev TFTs all wnl; she prev had Endocrine in [place] prescribe Synthroid for her but she stopped going; clinically & biochemically euthyroid...  ~  3/15: on Synthroid100-12 tab daily; Labs showed TSH= 3.00 ~  She stopped the Synthroid again... ~  Labs 2/16 not on meds showed TSH=0.39 & FreeT4=1.54   GERD (ICD-530.81) - prev on Nexium but off all meds since 1/10... IRRITABLE BOWEL SYNDROME (ICD-564.1) - colonoscopy 1982 by DrPatterson was WNL... She has refused f/u GI eval & f/u colonoscopy...  ?KIDNEY STONE Hx >> pt said she passed a kidney stone 3/11 w/o any medical attention, w/o work up or proof of same; entry made here for future reference if needed.  UTERINE CANCER, HX OF (ICD-V10.42) - s/p laparoscopic hysterectomy & BSO 7/07 by DrSkinner at [place]...   DEGENERATIVE JOINT DISEASE (ICD-715.90) - on VICODIN tid prn,  ANAPROX 272m Bid prn, ULTRAM 551mPrn... ? 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 she was on Wellbutrin & Vicodin when last seen in 2006... VITAMIN D DEFICIENCY (ICD-268.9) - Vit D level 5/10 = 16... rec> start OTC Vit D 2000 u daily. ~  9/14: on Vicodin, Tramadol, Aleve, VitD, etc; prev eval by DrDeveshwar for rheum; now c/o pain in left knee (she thinks it's "siatica"), can't exercise & [facility]- ok... ~  3/15: she states that  topical [name] ("horse linament") rubbed into knees really helps but she wants to keep the Vicodinon hand just in  case...  Hx of HEADACHE (ICD-784.0)  DYSTHYMIA (ICD-300.4) - off Celexa,  off Zoloft, and on ALPRAZOLAM 0.76mTid as needed... [place]x w/ seasonal affective disorder... prev seeing psychologist [name] for eating disorder counselling... ~  4/13: she mentioned seeing [name] again for eating disorder & counseling for depression etc... ~  9/14: on Alprazolam 0.53mtid prn; prev eval by Psychiatry w/ seasonal affective disorder; still sees Psychology [name] re: eating disorder; under considerable stress due to relationship w/ sister, financial pressure, etc...  ~  3/15: she has the Alpraz0.38m38mor prn use but seldom uses it; still sees [name] on occas but doesn't think she needs to continue... ~  11/15:  She wants antidepressant med & we discussed Zoloft tria 100m38m/2 => 1 tab daily... She still sees Psychologist [name]. ~  3/16:  She is off the Zoloft & states the AlprSharin Gravetoo strong (advised cutting the med & try lower dose for the desired effect...  HEALTH MAINTENANCE:   ~  GI:  Per seen by DrPatterson but last colon was 1982 & she refuses follow up... ~  GYN:  [facility]  She had Pneumovax in the past; she declines Flu shots and Tetanus shots...   Past Surgical History:  Procedure Laterality Date  . APPENDECTOMY    . BILATERAL SALPINGOOPHORECTOMY  2007   [place]  . CHOLECYSTECTOMY  1982  . LAPAROSCOPIC GASTRIC BANDING  08/2007   Dr. MartHassell DoneLAPAROSCOPIC HYSTERECTOMY  2007   [place]  . SPLENECTOMY  at [facility]   d/t trauma    Outpatient Encounter Prescriptions as of 04/09/2015  Medication Sig  . albuterol (PROVENTIL HFA;VENTOLIN HFA) 108 (90 BASE) MCG/ACT inhaler Inhale 2 puffs into the lungs every 6 (six) hours as needed for wheezing or shortness of  breath.  . ALPRAZolam (XANAX) 0.5 MG tablet TAKE 1/2 TO 1 TABLET(S) BY MOUTH THREE TIMES DAILY AS NEEDED FOR NERVES. NOT TO EXCEED 3 TABS/DAY.  . B Complex-C-Folic Acid (B-COMPLEX BALANCED) TABS Take 1 tablet by mouth daily.  . budesonide-formoterol (SYMBICORT) 160-4.5 MCG/ACT inhaler Inhale 2 puffs into the lungs 2 (two) times daily.  . cetirizine (ZYRTEC) 10 MG tablet Take 10 mg by mouth daily.    . Cholecalciferol (VITAMIN D-3) 5000 UNITS TABS Take 1 tablet by mouth daily.  . Coconut Oil 1000 MG CAPS Take 4 capsules by mouth daily.  . cyclobenzaprine (FLEXERIL) 10 MG tablet Take 1 tablet (10 mg total) by mouth 3 (three) times daily as needed for muscle spasms.  . furosemide (LASIX) 40 MG tablet TAKE 1-2 TABLETS ONCE A DAY AS NEEDED FOR SWELLING  . Magnesium 400 MG CAPS Take 2 capsules by mouth daily.  . methylPREDNISolone (MEDROL) 4 MG tablet TAKE 1 TABLET (4 MG TOTAL) BY MOUTH DAILY. TAKE AS DIRECTED  . naproxen sodium (ANAPROX) 220 MG tablet Take 2 tabs by mouth once daily   . sertraline (ZOLOFT) 100 MG tablet TAKE 1/2 TABLET BY MOUTH DAILY  . SPIRIVA HANDIHALER 18 MCG inhalation capsule PLACE 1 CAPSULE (18 MCG TOTAL) INTO INHALER AND INHALE DAILY.  . traMADol (ULTRAM) 50 MG tablet TAKE 1 TABLET BY MOUTH  3 TIMES DAILY   AS NEEDED FOR PAIN  . vitamin C (ASCORBIC ACID) 250 MG tablet Take 250 mg by mouth daily.  . alMarland Kitchenopurinol (ZYLOPRIM) 100 MG tablet Take 1 tablet (100 mg total)  by mouth daily. (Patient not taking: Reported on 04/09/2015)  . colchicine 0.6 MG tablet Take 1 tablet (0.6 mg total) by mouth daily.  Marland Kitchen etodolac (LODINE XL) 400 MG 24 hr tablet Take 1 tablet (400 mg total) by mouth 2 (two) times daily as needed. (Patient not taking: Reported on 04/09/2015)  . levalbuterol (XOPENEX) 1.25 MG/3ML nebulizer solution Take 1.25 mg by nebulization 3 (three) times daily. (Patient not taking: Reported on 04/09/2015)  . traZODone (DESYREL) 100 MG tablet Take 0.5-1 tablets (50-100 mg total) by mouth  at bedtime as needed for sleep.    Allergies  Allergen Reactions  . Penicillins     REACTION: nausea and vomiting    Current Medications, Allergies, Past Medical History, Past Surgical History, Family History, and Social History were reviewed in [employer] record.   Review of Systems:         See HPI - all other systems neg except as noted... The patient complains of dyspnea on exertion and difficulty walking; chest discomfort, & leg weakness.  The patient denies anorexia, fever, weight loss, weight gain, vision loss, decreased hearing, hoarseness, syncope, prolonged cough, headaches, hemoptysis, abdominal pain, melena, hematochezia, severe indigestion/heartburn, hematuria, incontinence, suspicious skin lesions, transient blindness, depression, unusual weight change, abnormal bleeding, enlarged lymph nodes, and angioedema.    Objective:   Physical Exam:    WD, Morbidly Obese, 65 y/o WF in no acute distress but c/o dyspnea/ wheezing... GENERAL:  Alert & oriented; pleasant & cooperative... HEENT:  [place]/AT, EOM-wnl, PERRLA, EACs-clear, TMs-wnl, NOSE-clear, THROAT-clear & wnl. NECK:  Supple w/ fairROM; no JVD; normal carotid impulses w/o bruits; no thyromegaly or nodules palpated; no lymphadenopathy. CHEST:  no accessory musc use, decr BS bilat, no wheezing/ rales/ rhonchi... HEART:  Regular Rhythm; without murmurs/ rubs/ or gallops heard... ABDOMEN:  Obese, soft & nontender; normal bowel sounds; no organomegaly or masses detected. EXT: without deformities, mod arthritic changes; no varicose veins/ +venous insuffic/trace edema. NEURO:  CNs intact; no focal neuro deficits... DERM: few ecchymoses, no rash etc...  RADIOLOGY DATA:  Reviewed in the EPIC EMR & discussed w/ the patient...  LABORATORY DATA:  Reviewed in the EPIC EMR & discussed w/ the patient...   Assessment & Plan:    ENT>> she was working w/ DrPlonk at [employer] in [place]...  OSA>  As prev  noted she needs new sleep study & appropriate new CPAP apparatus to facilitate compliance but she declines the repeat study or sleep med referral...  COPD/ Emphysema>  Acute exac 2/16 improved after hosp in [place] w/ Levaquin, Pred taper, NEBS Bid, Avair500, Spiriva, Mucinex etc... Encouraged to stay on her O2 regularly & we will add Humidity, needs max nasal hygiene vs ENT follow up; continue NEBS/ Advair500/ Spiriva regularly & use the Mucinex; we will request the [facility] home Care home COPD program; needs to lose wt & incr exercise... 4/16>  Spirometry w/ severe airflow obstruction and GOLD Stage 3-4 COPD; optimize meds and refer to [facility]- ENT & Pulm divisions to see if there is anything that they can do to help... 5/16>  She has seen ENT & Pulm 2nd opinion is pending; she cannot manipulate the port O2 tanks and we will request an [employer]OGEN One- port O2 concentrator for her use...  5/16>  States breathing worse, asked to use all regular meds regularly & we will add Depo120, Medrol taper... 6/16>  She had 2nd opinion consult w/ DrStephens/ DrHaponik at [facility] see above... 10/16>  Pt very frustrated  after working w/ the [employer] division at [employer] for several months- Nebs, Advair500, Spiriva, Medrol, Oxygen, pulm rehab, etc...  11/16>  On Medrol25m- she incr herself to 632md, Symbicort160-2spBid, Spiriva daily, Xopenex NEBS Tid (only doing it once/d), Guaifenesin400- taking 6/d w/ fluids, O2- she is not using it at rest, using 5L/min w/ exercise; she takes Lasix40, Tamadol50, & Xanax0.5 as needed... 8/17>  She is now taking an internet supplement CBD oil (hemp oil) under her tongue daily- "it's good for your lungs" & she has weaned down her O2 and Medrol; Advised to continue O2- 2L/min at rest & 4L/min w/ exercise and take her other meds regularly...  02/10/16>   Overall GiArtis Delays stable- she requests 90d refill prescriptions and we did this;  We reviewed her labs, XRays, & PFTs- rec to take meds regularly & not adjust  on her own, rec to return to exercise program & consider pulm rehab.  Hx CP w/ neg cardiac evals including prev Myoviews; 2DEchos w/o incr PA pressures... 4/15> she had Cardiology consult for eval of AtypCP (DCherly [name] EKG was WNL & 2DEcho showed norm LVF, Gr1DD & norm RV & PA pressures... [facility]16> [facility] and norm RV...  OBESITY>  She had remote lap band surg by DrMMartin; Met syndrome, must get wt down etc; she is a lap band failure & prev counseling by [name]... 3/16> weight down to 255# post hosp... 4/16> weight is back up to 271# despite all efforts... 8/17> weight = 271#  ? Hx Hypothy>  TFTs remain normal off meds- no problem... 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  GI>  GERD> she denies symptoms & uses OTC meds prn...  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 C/O right ankle pain, eval by DrZSmith> no better on his anti-inflamm rx so we will Rx w/ Depo120 & Medrol taper...  Dysthymia>  Much stress in her life, on Alpraz prn; has embraced alt lifestyle...   Patient's Medications  New Prescriptions   No medications on file  Previous Medications   B COMPLEX-C-FOLIC ACID (B-COMPLEX BALANCED) TABS    Take 1 tablet by mouth daily.   BUDESONIDE-FORMOTEROL (SYMBICORT) 160-4.5 MCG/ACT INHALER    Inhale 2 puffs into the lungs 2 (two) times daily.   CETIRIZINE (ZYRTEC) 10 MG TABLET    Take 10 mg by mouth daily.     CHOLECALCIFEROL (VITAMIN D-3) 5000 UNITS TABS    Take 1 tablet by mouth daily.   COCONUT OIL 1000 MG CAPS    Take 4 capsules by mouth daily.   CYCLOBENZAPRINE (FLEXERIL) 10 MG TABLET    Take 1 tablet (10 mg total) by mouth 3 (three) times daily as needed for muscle  spasms.   GUAIFENESIN (MUCINEX) 600 MG 12 HR TABLET    Take 1,600 mg by mouth 2 (two) times daily.   GUAIFENESIN-CODEINE (CHERATUSSIN AC) 100-10 MG/5ML SYRUP    Take 5 mLs by mouth every 4 (four) hours as needed for cough.   MAGNESIUM 400 MG CAPS    Take 2 capsules by mouth 2 (two) times daily.    NAPROXEN SODIUM (ANAPROX) 220 MG TABLET    Take 2 tabs by  mouth once daily    SPIRIVA HANDIHALER 18 MCG INHALATION CAPSULE    PLACE 1 CAPSULE (18 MCG TOTAL) INTO INHALER AND INHALE DAILY.   TRAMADOL (ULTRAM) 50 MG TABLET    Take 1 tablet (50 mg total) by mouth 3 (three) times daily as needed.   TURMERIC 500 MG TABS    Take 1 tablet by mouth daily.   VITAMIN C (ASCORBIC ACID) 250 MG TABLET    Take 250 mg by mouth daily.  Modified Medications   Modified Medication Previous Medication   ALBUTEROL (VENTOLIN HFA) 108 (90 BASE) MCG/ACT INHALER VENTOLIN HFA 108 (90 Base) MCG/ACT inhaler      INHALE 2 PUFFS INTO THE LUNGS EVERY 6 (SIX) HOURS AS NEEDED FOR WHEEZING OR SHORTNESS OF BREATH.    INHALE 2 PUFFS INTO THE LUNGS EVERY 6 (SIX) HOURS AS NEEDED FOR WHEEZING OR SHORTNESS OF BREATH.   ALPRAZOLAM (XANAX) 0.5 MG TABLET ALPRAZolam (XANAX) 0.5 MG tablet      TAKE 1/2 TO 1 TABLET(S) BY MOUTH THREE TIMES DAILY AS NEEDED FOR NERVES. NOT TO EXCEED 3 TABS/DAY.    TAKE 1/2 TO 1 TABLET(S) BY MOUTH THREE TIMES DAILY AS NEEDED FOR NERVES. NOT TO EXCEED 3 TABS/DAY.   COLCHICINE (COLCRYS) 0.6 MG TABLET COLCRYS 0.6 MG tablet      Take 1 tablet (0.6 mg total) by mouth daily.    TAKE 1 TABLET (0.6 MG TOTAL) BY MOUTH DAILY.   FUROSEMIDE (LASIX) 40 MG TABLET furosemide (LASIX) 40 MG tablet      TAKE 1-2 TABLETS ONCE A DAY AS NEEDED FOR SWELLING    TAKE 1-2 TABLETS ONCE A DAY AS NEEDED FOR SWELLING   IPRATROPIUM-ALBUTEROL (DUONEB) 0.5-2.5 (3) MG/3ML SOLN ipratropium-albuterol (DUONEB) 0.5-2.5 (3) MG/3ML SOLN      Take 3 mLs by nebulization 3 (three) times daily.    Take 3 mLs by nebulization 3 (three) times daily.  Discontinued  Medications   METHYLPREDNISOLONE (MEDROL) 4 MG TABLET    TAKE 1 TABLET BY MOUTH TWICE DAILY, OR AS DIRECTED BY DR. Preeya Cleckley   SERTRALINE (ZOLOFT) 100 MG TABLET    TAKE 1/2 TABLET BY MOUTH DAILY   TRAZODONE (DESYREL) 100 MG TABLET    Take 0.5-1 tablets (50-100 mg total) by mouth at bedtime as needed for sleep.   UNABLE TO FIND    Med Name: Guadlupe Spanish Tablets- takes 2 tablets qd

## 2016-02-11 ENCOUNTER — Encounter: Payer: Self-pay | Admitting: Pulmonary Disease

## 2016-02-11 ENCOUNTER — Other Ambulatory Visit: Payer: Self-pay

## 2016-02-11 MED ORDER — BUDESONIDE-FORMOTEROL FUMARATE 160-4.5 MCG/ACT IN AERO: 2.0000 | INHALATION_SPRAY | Freq: Two times a day (BID) | RESPIRATORY_TRACT | 6 refills | Status: DC

## 2016-02-11 MED ORDER — TIOTROPIUM BROMIDE MONOHYDRATE 18 MCG IN CAPS: 18.0000 ug | ORAL_CAPSULE | Freq: Every day | RESPIRATORY_TRACT | 11 refills | Status: DC

## 2016-02-11 NOTE — Telephone Encounter (Signed)
Pt. Called stating she never had her refills of her Symbicort or Spiriva sent in to the pharmacy after her visit. They were sent to her pharmacy of choice with the amount of refills Dr. Lenna Gilford approved. Nothing further is needed at this time.   Patient Instructions by Noralee Space, MD at 02/10/2016 11:30 AM   Author: Noralee Space, MD Author Type: Physician Filed: 02/10/2016 12:42 PM  Note Status: Signed Cosign: Cosign Not Required Encounter Date: 02/10/2016 11:30 AM  Editor: Noralee Space, MD (Physician)    Today we updated your med list in our EPIC system...    Continue your current medications the same...  We refilled your meds for 30d supplies- refillable for 25yr...  Let me know how your [facility] exercise program is doing for you...  I will send a note to [facility] regarding the need for 26 portable tanks/ month...  Call for any questions...  Let's plan a follow up visit in 21mo, sooner if needed for problems.Marland KitchenMarland Kitchen

## 2016-02-17 DIAGNOSIS — Z87891 Personal history of nicotine dependence: Secondary | ICD-10-CM | POA: Diagnosis not present

## 2016-02-17 DIAGNOSIS — J45909 Unspecified asthma, uncomplicated: Secondary | ICD-10-CM | POA: Diagnosis not present

## 2016-02-17 DIAGNOSIS — J449 Chronic obstructive pulmonary disease, unspecified: Secondary | ICD-10-CM | POA: Diagnosis not present

## 2016-02-17 DIAGNOSIS — J3489 Other specified disorders of nose and nasal sinuses: Secondary | ICD-10-CM | POA: Diagnosis not present

## 2016-02-17 DIAGNOSIS — R0981 Nasal congestion: Secondary | ICD-10-CM | POA: Diagnosis not present

## 2016-02-17 DIAGNOSIS — Z9981 Dependence on supplemental oxygen: Secondary | ICD-10-CM | POA: Diagnosis not present

## 2016-02-17 DIAGNOSIS — J342 Deviated nasal septum: Secondary | ICD-10-CM | POA: Diagnosis not present

## 2016-02-17 DIAGNOSIS — H6123 Impacted cerumen, bilateral: Secondary | ICD-10-CM | POA: Diagnosis not present

## 2016-02-17 DIAGNOSIS — H608X3 Other otitis externa, bilateral: Secondary | ICD-10-CM | POA: Diagnosis not present

## 2016-03-31 ENCOUNTER — Telehealth: Payer: Self-pay

## 2016-03-31 ENCOUNTER — Telehealth: Payer: Self-pay | Admitting: Pulmonary Disease

## 2016-03-31 DIAGNOSIS — J449 Chronic obstructive pulmonary disease, unspecified: Secondary | ICD-10-CM

## 2016-03-31 DIAGNOSIS — J9611 Chronic respiratory failure with hypoxia: Secondary | ICD-10-CM

## 2016-03-31 NOTE — Telephone Encounter (Signed)
SN  Please Advise-   This pt. Called in and stated her 5 year rotation is about to end with [facility] on march the 2nd and she wants to change to [facility] because she was informed that they offer a portable concentrator , that will run 1-6L continuous and this is what she would like and that they work with medicare. She wanted to know if you were willing to write this rx for her. She stated if you need her to come in for an appointment she is willing to come in.

## 2016-03-31 NOTE — Telephone Encounter (Signed)
Will close message another phone message was placed and a nurse was able to speak with [facility], Please refer to other phone message.

## 2016-03-31 NOTE — Telephone Encounter (Signed)
SN wrote script: [employer] oxygen concentrator portable unit that runs 1-6L/min continuous flow oxygen  Called spoke with patient and informed her that SN wrote the Rx for her.  Pt would like this mailed to her.  Home address verified.  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  Because she is no longer 'house bound' [facility] will no longer pick-up/deliver her o2 tanks and require her to take them to the [facility] store.  Patient is very concerned about not being able to do this and began crying on the phone saying that "[facility] wants her to stay in the house and die" and she "doesn't want to suffocate."  Provided comfort and assurance to patient that this is NOT the case but we would try to see what we can do to help her.  Spoke with [name] with [facility].  This rule is system-wide - if patient is not bed-ridden then the general rule is for the patients to take their tanks to the store for refill.  Dr Lenna Gilford, I'm not sure what we can do.  Patient is part of [place] - maybe see if that system could offer any support?  She only needs this service until the end of February when she no longer needs [facility] services.  Please advise, thank you.

## 2016-03-31 NOTE — Telephone Encounter (Signed)
Unable to get through to [facility] was on hold for over 10 min.s  Need to get more information  Pt. 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 She currently uses E tanks. She stated that because she told [facility] that she does this they now are stating that she no longer qualifies for E tanks because she is not home bound. She is very upset about this and does not understand and feels like she is being attacked for doing something that makes her happy.

## 2016-04-01 NOTE — Telephone Encounter (Signed)
Per SN: yes, thanks.  See if [facility] can help.  Otherwise maybe patient can forego the volunteer work until after this process is complete?  Order placed to [facility] to see if they can offer assistance to patient

## 2016-04-08 ENCOUNTER — Other Ambulatory Visit: Payer: Self-pay

## 2016-04-08 NOTE — Patient Outreach (Signed)
[facility]) Care Management  04/08/2016  Rebecca [name] May 31, 1950 [id number]  Telephonic Screening  Referral Date:  04/02/16 Source:  Dr. Teressa Lower Issue:   Please assess for any needed intervention.  Patient currently uses E-tanks,  DME provider [facility]) requires patient to transport patient's oxygen tanks to the facility to exchange them.  Due to her COPD and other co-morbidities, it is very difficult for patient to lift and carry these tanks to and from her vehicle.  The only way [facility]) will pick these tanks up for patient is if patient is homebound but patient is trying to stay active. 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   H/o [name] with [facility] has verified system-wide policy (regulation). H/o [facility] services should end around end of February.  H/o patient has discussed an interest in changing DME providers from [facility] to [facility] due to her 5 year rotation will end with [facility] 05/01/16.  Patient is interested in a portable concentrator that will run 1-6 L continuous flow.    [place]  [place] [address] [phone] (M)  Per MR history: Providers: Primary / Pulmonologist: Dr. Teressa Lower - last appt 02/10/16 ENT:  Dr. Germain Osgood & Dr. Susann Givens, [place]: [facility]  DME:  Oxygen, nebulizer  Psycho/Social: Patient lives in her home.  Mobility: Falls: Pain: Depression:  H/o dysthymic disorder and seasonal affective disorder.  Transportation:  Resources: DME: home oxygen [facility]), nebulizer  Co-morbidities:  COPD (mixed type), Chronic hypoxemic respiratory failure, OSA(not on CPAP; patient refused),  GERD, Degenerative Joint Disease Colonoscopy 12/31/1980 Mammogram 12/21/2006 Tobacco:  H/o former user / Quit 1993 Admissions: 0 ER visits: 0  BP 118/72 02/10/2016 Weight 270 lb (122 kg) 02/10/2016 Height 65 in (165 cm)  02/10/2016 BMI 45.00 (Obese Class III) 02/10/2016  Lipid Panel completed 05/05/2012 HDL 26.200 05/05/2012 LDL 119.000 05/05/2012 Cholesterol, total 168.000 05/05/2012 Triglycerides 113.000 05/05/2012 A1C 6.700 11/17/2013 Glucose Random 105.000 02/10/2016  Medications:  Patient taking more than 15 medications per MR. Co-pay cost issues: Prevnar (PCV13) N/D Pneumovax (PPS N/D yes  Flu Vaccine N/D refused  tDAP Vaccine N/D - refused  Outreach call #1 to patient.  Patient not reached.  No answer following multiple rings at home #.  Additional call to cell #.  Patient not reached.  RN CM left HIPAA compliant voice message with name and number.   RN CM scheduled for next call within one week.    Rebecca [name], BSN, RN, [place] Care Management Care Management Coordinator [phone] Direct [phone] Cell [phone] Office [phone] Fax [email or web address]

## 2016-04-09 ENCOUNTER — Other Ambulatory Visit: Payer: Self-pay

## 2016-04-09 NOTE — Patient Outreach (Signed)
[facility]) Care Management  04/09/2016  Rebecca [name] 05-Nov-1950 [id number]  Telephonic Screening  Referral Date:  04/02/16 Source:  Dr. Teressa Lower Issue:   Please assess for any needed intervention.  Patient currently uses E-tanks,  DME provider [facility]) requires patient to transport patient's oxygen tanks to the facility to exchange them.  Due to her COPD and other co-morbidities, it is very difficult for patient to lift and carry these tanks to and from her vehicle.  The only way [facility]) will pick these tanks up for patient is if patient is homebound but patient is trying to stay active. 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   H/o [name] with [facility] has verified system-wide policy (regulation). H/o [facility] services should end around end of February.  H/o patient has discussed an interest in changing DME providers from [facility] to [facility] due to her 5 year rotation will end with [facility] 05/01/16.  Patient is interested in a portable concentrator that will run 1-6 L continuous flow.    [place]  [place] [address] [phone] (M)  Per MR history: Providers: Primary / Pulmonologist: Dr. Teressa Lower - last appt 02/10/16 ENT:  Dr. Germain Osgood & Dr. Susann Givens, [place]: [facility]  DME:  Oxygen, nebulizer  Psycho/Social: Patient lives in her home.  Mobility: Falls: Pain: Depression:  H/o dysthymic disorder and seasonal affective disorder.  Transportation:  Resources: DME: home oxygen [facility]), nebulizer  Co-morbidities:  COPD (mixed type), Chronic hypoxemic respiratory failure, OSA(not on CPAP; patient refused),  GERD, Degenerative Joint Disease Colonoscopy 12/31/1980 Mammogram 12/21/2006 Tobacco:  H/o former user / Quit 1993 Admissions: 0 ER visits: 0  BP 118/72 02/10/2016 Weight 270 lb (122 kg) 02/10/2016 Height 65 in (165 cm)  02/10/2016 BMI 45.00 (Obese Class III) 02/10/2016  Lipid Panel completed 05/05/2012 HDL 26.200 05/05/2012 LDL 119.000 05/05/2012 Cholesterol, total 168.000 05/05/2012 Triglycerides 113.000 05/05/2012 A1C 6.700 11/17/2013 Glucose Random 105.000 02/10/2016  Medications:  Patient taking more than 15 medications (per MR). Co-pay cost issues: Prevnar (PCV13) N/D Pneumovax (PPS N/D yes  Flu Vaccine N/D refused  tDAP Vaccine N/D - refused  Outreach call #2 to patient.  Patient not reached.  No answer following multiple rings at home #.  Additional call to cell #.  Patient not reached.   RN CM left HIPAA compliant voice message with name and number on cell #.   RN CM scheduled for next call within one week.    Nathaneil Canary, BSN, RN, [place] Management Care Management Coordinator [phone] Direct [phone] Cell [phone] Office [phone] Fax [email or web address]

## 2016-04-10 ENCOUNTER — Ambulatory Visit: Payer: Self-pay

## 2016-04-10 ENCOUNTER — Other Ambulatory Visit: Payer: Self-pay

## 2016-04-10 NOTE — Patient Outreach (Signed)
[facility]) Care Management  04/10/2016  ZOEYANN GRAPER 07/10/1950 [id number]  Telephonic Screening  Referral Date:  04/02/16 Source:  Dr. Teressa Lower Issue:   Please assess for any needed intervention.  Patient currently uses E-tanks,  DME provider [facility]) requires patient to transport patient's oxygen tanks to the facility to exchange them.  Due to her COPD and other co-morbidities, it is very difficult for patient to lift and carry these tanks to and from her vehicle.  The only way [facility]) will pick these tanks up for patient is if patient is homebound but patient is trying to stay active. 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   H/o [name] with [facility] has verified system-wide policy (regulation). H/o [facility] services should end around end of February.  H/o patient has discussed an interest in changing DME providers from [facility] to [facility] due to her 5 year rotation will end with [facility] 05/01/16.  Patient is interested in a portable concentrator that will run 1-6 L continuous flow.    [place]  [place] [address] [phone] (M)  Per MR history: Providers: Primary / Pulmonologist: Dr. Teressa Lower - last appt 02/10/16 ENT:  Dr. Germain Osgood & Dr. Susann Givens, [facility] HH: [facility]  DME: home oxygen [facility]), nebulizer  Co-morbidities:  COPD (mixed type), Chronic hypoxemic respiratory failure, OSA(not on CPAP; patient refused),  GERD, Degenerative Joint Disease, H/o dysthymic disorder and seasonal affective disorder.  Colonoscopy 12/31/1980 Mammogram 12/21/2006 Tobacco:  H/o former user / Quit 1993 Admissions: 0 ER visits: 0  BP 118/72 02/10/2016 Weight 270 lb (122 kg) 02/10/2016 Height 65 in (165 cm) 02/10/2016 BMI 45.00 (Obese Class III) 02/10/2016  Lipid Panel completed 05/05/2012 HDL 26.200 05/05/2012 LDL 119.000 05/05/2012 Cholesterol,  total 168.000 05/05/2012 Triglycerides 113.000 05/05/2012 A1C 6.700 11/17/2013 Glucose Random 105.000 02/10/2016  Medications:  Patient taking more than 15 medications (per MR). Prevnar (PCV13) N/D Pneumovax (PPS N/D yes  Flu Vaccine N/D refused  tDAP Vaccine N/D - refused  Outreach call #3 to patient.  Patient not reached.  No answer following multiple rings at home #.  Additional call to cell #.  Patient not reached.   RN CM left HIPAA compliant voice message with name and number on cell #.   RN CM mailed unsuccessful outreach letter to patient; will close case if no response received back from patient over the next 2 weeks.   Nathaneil Canary, BSN, RN, [place] Care Management Care Management Coordinator [phone] Direct [phone] Cell [phone] Office [phone] Fax [email or web address]

## 2016-04-16 ENCOUNTER — Encounter: Payer: Self-pay | Admitting: Pulmonary Disease

## 2016-04-16 ENCOUNTER — Ambulatory Visit (INDEPENDENT_AMBULATORY_CARE_PROVIDER_SITE_OTHER): Payer: Medicare Other | Admitting: Pulmonary Disease

## 2016-04-16 VITALS — BP 130/84 | HR 112 | Temp 98.3°F | Resp 18 | Ht 65.0 in | Wt 265.0 lb

## 2016-04-16 DIAGNOSIS — G4733 Obstructive sleep apnea (adult) (pediatric): Secondary | ICD-10-CM | POA: Diagnosis not present

## 2016-04-16 DIAGNOSIS — J449 Chronic obstructive pulmonary disease, unspecified: Secondary | ICD-10-CM | POA: Diagnosis not present

## 2016-04-16 DIAGNOSIS — J9611 Chronic respiratory failure with hypoxia: Secondary | ICD-10-CM

## 2016-04-16 DIAGNOSIS — F341 Dysthymic disorder: Secondary | ICD-10-CM | POA: Diagnosis not present

## 2016-04-16 NOTE — Patient Instructions (Signed)
Today we updated your med list in our EPIC system...    Continue your current medications the same...  Continue your breathing treatments w/ NEBULIZER & Duoneb 3 times daily followed by Symbicort twice daily & Spiriva once daily...  We will contact other DME companies to see if they can help [place] to help you w/ the oxygen dilemma...  Call for any questions...  Let's plan a follow up visit in 3-7mo, sooner if needed for problems.Marland KitchenMarland Kitchen

## 2016-04-24 ENCOUNTER — Ambulatory Visit: Payer: Self-pay

## 2016-04-29 ENCOUNTER — Other Ambulatory Visit: Payer: Self-pay

## 2016-04-29 NOTE — Patient Outreach (Signed)
[facility]) Care Management  04/29/2016  Rebecca [name] [birth year] [id number]   Case Closure  Patient unable to reach with several phone attempts and no response received back from unsuccessful outreach letter.  [facility] notified:  #1 - unable to reach.  Case Closure letter sent to Primary / Pulmonologist: Dr. Teressa Lower.  Nathaneil Canary, BSN, RN, [place] Care Management Care Management Coordinator [phone] Direct [phone] Cell [phone] Office [phone] Fax [email or web address]

## 2016-05-13 ENCOUNTER — Telehealth: Payer: Self-pay | Admitting: Pulmonary Disease

## 2016-05-13 MED ORDER — AZITHROMYCIN 250 MG PO TABS: ORAL_TABLET | ORAL | 0 refills | Status: DC

## 2016-05-13 NOTE — Telephone Encounter (Signed)
Called and spoke to pt. Pt c/o head congestion x 1 day. Pt states her roommate has an URI and was around her yesterday 3.13.18 and is afraid the head congestion will move to her lungs. Pt is requesting recs to help prevent the head congestion to becoming an URI. Pt is requesting recs from SN today 05/13/16.   Dr. Lenna Gilford, please advise. Thanks.   Allergies  Allergen Reactions  . Penicillins     REACTION: nausea and vomiting    Current Outpatient Prescriptions on File Prior to Visit  Medication Sig Dispense Refill  . albuterol (VENTOLIN HFA) 108 (90 Base) MCG/ACT inhaler INHALE 2 PUFFS INTO THE LUNGS EVERY 6 (SIX) HOURS AS NEEDED FOR WHEEZING OR SHORTNESS OF BREATH. 18 each 5  . ALPRAZolam (XANAX) 0.5 MG tablet TAKE 1/2 TO 1 TABLET(S) BY MOUTH THREE TIMES DAILY AS NEEDED FOR NERVES. NOT TO EXCEED 3 TABS/DAY. 90 tablet 5  . B Complex-C-Folic Acid (B-COMPLEX BALANCED) TABS Take 1 tablet by mouth daily.    . budesonide-formoterol (SYMBICORT) 160-4.5 MCG/ACT inhaler Inhale 2 puffs into the lungs 2 (two) times daily. 1 Inhaler 6  . cetirizine (ZYRTEC) 10 MG tablet Take 10 mg by mouth daily.      . Cholecalciferol (VITAMIN D-3) 5000 UNITS TABS Take 1 tablet by mouth daily.    . Coconut Oil 1000 MG CAPS Take 4 capsules by mouth daily.    . colchicine (COLCRYS) 0.6 MG tablet Take 1 tablet (0.6 mg total) by mouth daily. 30 tablet 11  . cyclobenzaprine (FLEXERIL) 10 MG tablet Take 1 tablet (10 mg total) by mouth 3 (three) times daily as needed for muscle spasms. 90 tablet 5  . furosemide (LASIX) 40 MG tablet TAKE 1-2 TABLETS ONCE A DAY AS NEEDED FOR SWELLING 60 tablet 3  . guaiFENesin (MUCINEX) 600 MG 12 hr tablet Take 1,600 mg by mouth 2 (two) times daily.    Marland Kitchen guaiFENesin-codeine (CHERATUSSIN AC) 100-10 MG/5ML syrup Take 5 mLs by mouth every 4 (four) hours as needed for cough. 473 mL 0  . ipratropium-albuterol (DUONEB) 0.5-2.5 (3) MG/3ML SOLN Take 3 mLs by nebulization 3 (three) times daily. 270 mL 7   . Magnesium 400 MG CAPS Take 2 capsules by mouth 2 (two) times daily.     . naproxen sodium (ANAPROX) 220 MG tablet Take 2 tabs by mouth once daily     . tiotropium (SPIRIVA HANDIHALER) 18 MCG inhalation capsule Place 1 capsule (18 mcg total) into inhaler and inhale daily. 30 capsule 11  . traMADol (ULTRAM) 50 MG tablet Take 1 tablet (50 mg total) by mouth 3 (three) times daily as needed. 90 tablet 5  . Turmeric 500 MG TABS Take 1 tablet by mouth daily.    . vitamin C (ASCORBIC ACID) 250 MG tablet Take 250 mg by mouth daily.     No current facility-administered medications on file prior to visit.

## 2016-05-13 NOTE — Telephone Encounter (Signed)
Per SN -   1. Zpak 2. Nasal saline 3. Mucinex 600mg  OTC, 2 PO BID with fluids 4. Is there anything else you want?  -------------------------------------------------  Called and spoke to pt. Informed her of the recs per SN. Rx sent to preferred pharmacy. Pt denied needed anything else. Nothing further needed at this time.

## 2016-05-14 NOTE — Progress Notes (Deleted)
[name] [place], [place] [address] Phone: [phone] Subjective:    I'm seeing this patient by the request  of:    CC: Possible gout flare  [id number]  Rebecca [name] is a 66 y.o. female coming in with complaint of      Past Medical History:  Diagnosis Date  . Chest pain   . Chronic venous insufficiency   . COPD (chronic obstructive pulmonary disease) ([place])   . DJD (degenerative joint disease)   . Dysthymia   . Fibromyalgia   . GERD (gastroesophageal reflux disease)   . History of headache   . Hodgkin lymphoma of extranodal or solid organ site [facility])   . Hypothyroid   . IBS (irritable bowel syndrome)   . Metabolic syndrome X   . Morbid obesity ([place])   . OSA (obstructive sleep apnea)   . Vitamin D deficiency    Past Surgical History:  Procedure Laterality Date  . APPENDECTOMY    . BILATERAL SALPINGOOPHORECTOMY  2007   [facility]  . CHOLECYSTECTOMY  1982  . LAPAROSCOPIC GASTRIC BANDING  08/2007   Dr. Hassell Done  . LAPAROSCOPIC HYSTERECTOMY  2007   [facility]  . 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 Marital status: Single    Spouse name: N/A  . Number of children: 1  . Years of education: N/A   Social History Main Topics  . Smoking status: Former Smoker    Packs/day: 1.00    Types: Cigarettes    Quit date: 03/03/1991  . Smokeless tobacco: Never Used  . Alcohol use No  . Drug use: No  . Sexual activity: Not on file   Other Topics Concern  . Not on file   Social History Narrative   Does not exercise   3 cups of coffee a day   Single- one child whom she gave up for adoption at birth   And re-established contact in 2009 w/ new relationship and 2 grand daughters.    Allergies  Allergen Reactions  . Penicillins     REACTION: nausea and vomiting   Family History  Problem Relation Age of Onset  . Heart failure Father   . Cancer Mother   . Heart attack Sister     Past  medical history, social, surgical and family history all reviewed in electronic medical record.  No pertanent information unless stated regarding to the chief complaint.   Review of Systems:Review of systems updated and as accurate as of 05/14/16  No headache, visual changes, nausea, vomiting, diarrhea, constipation, dizziness, abdominal pain, skin rash, fevers, chills, night sweats, weight loss, swollen lymph nodes, body aches, joint swelling, muscle aches, chest pain, shortness of breath, mood changes.   Objective  There were no vitals taken for this visit. Systems examined below as of 05/14/16   General: No apparent distress alert and oriented x3 mood and affect normal, dressed appropriately.  HEENT: Pupils equal, extraocular movements intact  Respiratory: Patient's speak in full sentences and does not appear short of breath  Cardiovascular: No lower extremity edema, non tender, no erythema  Skin: Warm dry intact with no signs of infection or rash on extremities or on axial skeleton.  Abdomen: Soft nontender  Neuro: Cranial nerves II through XII are intact, neurovascularly intact in all extremities with 2+ DTRs and 2+ pulses.  Lymph: No lymphadenopathy of posterior or anterior cervical chain or axillae bilaterally.  Gait normal with good balance and coordination.  MSK:  Non tender with full range of motion and good stability and symmetric strength and tone of shoulders, elbows, wrist, hip, knee and ankles bilaterally.     Impression and Recommendations:     This case required medical decision making of moderate complexity.      Note: This dictation was prepared with Dragon dictation along with smaller phrase technology. Any transcriptional errors that result from this process are unintentional.

## 2016-05-15 ENCOUNTER — Ambulatory Visit: Payer: Self-pay | Admitting: Family Medicine

## 2016-05-26 DIAGNOSIS — Z9114 Patient's other noncompliance with medication regimen: Secondary | ICD-10-CM | POA: Diagnosis not present

## 2016-05-26 DIAGNOSIS — J9621 Acute and chronic respiratory failure with hypoxia: Secondary | ICD-10-CM | POA: Diagnosis not present

## 2016-05-26 DIAGNOSIS — J441 Chronic obstructive pulmonary disease with (acute) exacerbation: Secondary | ICD-10-CM | POA: Diagnosis not present

## 2016-05-26 DIAGNOSIS — R918 Other nonspecific abnormal finding of lung field: Secondary | ICD-10-CM | POA: Diagnosis not present

## 2016-05-26 DIAGNOSIS — G4733 Obstructive sleep apnea (adult) (pediatric): Secondary | ICD-10-CM | POA: Diagnosis not present

## 2016-05-30 NOTE — Progress Notes (Addendum)
HPI  Review of Systems  Physical Exam  Patient ID: Rebecca [name], female   DOB: Sep 01, 1950, 66 y.o.   MRN: [id number] Subjective:   HPI 66 y/o WF known to me w/ mult med problems as noted below...  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  LABS 3/15:  Chems- BS=95 A1c=6.8 (needs low carb diet, exercise, wt loss) & Creat=1.5 (mild renal insuffic due to Lasix rx; Rec to decrease Lasix40 to 1 tab Qam...  2DEcho 4/15 showed norm LV sys function w/ EF=60-65%, normal wall motion, Gr1DD, normal valves, normal RV & PA pressures...   she had Cards eval Cherly [name] 4/15> he did not feel that invasive testing was warranted; she had 2DEcho but refused Myoview due to co-pay costs...   She remains on ADVAIR500Bid, SPIRIVA daily, & ProairHFA prn (uses 3-4 x daily "It really helps"); has Home O2 but dislikes the Liq O2 therefore not using & she wants a portable O2 concentrator to read 4L/min w/ exercise, 2L/min at rest.   She has continued to research her supplements and herbal meds>  She tells me that she is living on smoothies she makes out of juice, ice, baking soda, pomegranate syrup, & local honey;  Tumeric helps the arthritis in her knees and she notes that pinching her upper lip in the middle under her nose helps relieve musc cramps in her legs.  Ambulatory O2 Test 9/15>  O2 sat on RA at rest= 92% w/ pulse=86;  Nadir O2sat on RA after 2 laps= 84% w/ pulse=96...   CXR 9/15 showed COPD/emphysema, no acute abnormality...  LABS 9/15:  Chems- ok w/ BS=119, A1c=6.7, Cr=1.4;  CBC- ok...  2/16 [facility] records indicated a thorough evaluation & excellent care provided but treatment plan was hampered by noncompliance- refused CPAP, declines chestPT, alternately stopping her O2 & demanding more O2,   LABS 2/16:  ABG- pH=7.46, pCO2=35, pO2=134 on 6L/min; Chems- ok x BS=169, A1c=6.5;  CBC- normal; TSH=0.39 & FreeT4=1.54; Troponin/ D-dimer/ BNP- all wnl...  CXR 2/16 showed heart at upper lim of norm, hyperinflation, min bibasilar atx, NAD.Marland KitchenMarland Kitchen   EKG 2/16 showed NSR, rate88, low voltage, otherw wnl...  2DEcho 2/16 showed norm LVF w/ EF=55-60%, Gr1DD, norm valves and norm RV...   CXR 2/16 showed heart at upper lim of norm, hyperinflation, min bibasilar atx, NAD  Spirometry 06/2014 showed FVC=1.61 (50%), FEV1=0.88 (35%), %1sec=55, mi-flows are reduced to 18% predicted... 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  LABS 5/16:  Chems- wnl w/ BS=90, Cr=1.02;  Mg=2.2, Ca=9.0;  Uric=6.6.Marland KitchenMarland Kitchen   ~  September 04, 2014:  6wk ROV & Artis Delay has had ENT & Pulm evaluations at [facility] per our requests>>      ENT eval by DrPlonk at [facility] & his notes are reviewed> HxCOPD w/ revers component and chr nasal obstruction L>R, nasal crusting, bilat inferior nasal turbinate hypertrophy, etc;  They rec nasal saline, nasal steroid spray, mupirocin ointment, and breathe right nasal strips; they will consider surg if not improved...       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 CTChest 08/21/14 showed severe centrilobular emphysema, diffuse bronchial thickening, mild mucus plugging, no adenopathy, no lung nodules etc; also showed mild Ao & coronary calcif, lap band at GE junction, splenosis, DJD Tspine... PLAN> They decided  to slowly wean the Medrol, continue Advair & wean to Advair250Bid, continue Spiriva, continue NEBS & AlbutHFA; rec to continue her oxygen (but she continues to use it "prn"), consider repeat sleep study & CPAP titration, consider referral to [place] rehab, referred for nutritional counseling & ROV 175mo..      She reports breathing well recently, she has a new roommate, eating out a lot, went to "theclub", then one day over the weekend=> incr SOB, she's been taking Lasix80 Qam, rambling hx w/ flight of ideas regarding coffee, caffeine, not urinating enough, she had to use [facility] transport to [facility] to get to the clinic;  They decreased her Advair250-Bid, Medrol64md, continue Nebs (she's using Bid because she "cramps up" if she does it tid); she is awaiting nutritional counseling, pulm rehab referral, oxygen titration testing, sleep study...  We reviewed prob list, meds, xrays and labs> see below for updates >>  PLAN>>  She will maintain close contact & f/u w/ [facility];  Reminded to avoid sodium & carbs- handout given; plan ROV here in 2-39m639mo  ~  December 05, 2014:  39mo68mo & [name]'s new PCP is in [place]> she persists w/ 4+somatic complaints and signif health related anxiety; [facility] "they kicked me out due to CP- I can't go back until I get this evaluated"; she's been seeing Dr. SaraGermain Osgoodlm Fellow w/ [facility] & I have reviewed all the Care Everywhere notes-- several telephone encounters including their last 45 min conversation well documented, not much reversibility, they hoped for small improvement w/ Rehab exercise & staying on meds but she won't fill rx, can't afford, tearful/ frustrated/ depressed but refused psyche consult etc... Pt indicates that DrStephens said there was nothing wrong w/ her heart & pt doesn't want cardiac eval; DrStephens wants her to wean the Medrol but she's breathing better on 4mg/44mshe wants 6mg/d4m rec to compromise w/ 6mg al72m/ 4mg Qod26mhe wants a liq oxygen system again- she is quite adamant (being a former "nurse")Insurance claims handlershe needs 4-5L/min when active and tank only lasts 1-2H; she uses 4L/min Qhs as well; but O2sat=92% on RA at rest today; she refuses to go back to rehab, therefore encouraged to do it daily on her own; she has not pursued further bariatric considerations- she is working w/ Drlori's office regarding eating disorder "he doesn't think I'm depressed"...      IMP/PLAN>>  [name] has Artis Delayere airflow obstruction/ emphysema w/ little reversibility; Rec to take SYMBICORT160-2spBid & Spiriva daily; use Medrol 4mg tabs55mmg alt w28mmg Qod; X15mnex NEBS Tid;  Alprax 0.5mg - 1/2 t55m tab tid; ROV w/ me in 75mo...  ~  N88mober 7, 2016:  75mo ROV & pt 58momult complaints> head "stopped-up", ears congested, tried pseudophed but "it made me sick", doesn't want ENT f/u, rec to try Saline nasal mist & phenylephrine decongestant;  She is  discouraged by her weight, can't seem to lose any, she described in detail her meals from yest (we discussed calories "in" & "out", eat less, burn more), she says local Gym won't take her w/ oxygen;  She asked about Stem Cell treatment for COPD- I offered to send her to [facility] to inquire about this or lung transplant;  She is very frustrated- eating is an issue because when she fills up she can't breathe well due to her prev LapBand surg she says, if she belches she feels better...       Rec to continue Medrol67m- she incr herself to 648md, Symbicort160-2spBid, Spiriva daily, Xopenex NEBS Tid (only doing it once/d), Guaifenesin400- taking 6/d w/ fluids, O2- she is not using it at rest, using 5L/min w/ exercise; she takes Lasix40, Tamadol50, & Xanax0.5 as needed...   LABS 01/2015>  Chems- ok x Cr=1.52, BS=114;  CBC- ok x wbc=17K w/ left shift;  TSH=1.17;  Fe=78 (22%sat);  VitB12=864...   XRay [facility] 01/2015>  Mild ant wedge compression L1, DDD & anterolithesis L4 on L5,  poss right kidney stones...  ~  April 09, 2015:  51m151moV & [name] continues to complain about ever worsening breathing- DOE, dry cough, chest tightness, leg cramps, difficulty sleeping; notes good days and bad- "I got worked up over the election" she says; she thinks the zoloft is helping but she can't sleep w/ it- rec to try TRAZODONE instead (100m35m/2 to 1 tab Qhs);  Currently taking Oxygen that she adjusts herself, Medrol4mg/40mNEBS w/ Xopenex Tid (but she says insurance won't cover it), Symbicort160-2spBid, Spiriva daily, Lasix40 (just taking prn)... I suggested that she try the Oximizer=> given to pt to try...    See 08/2014 note above for summary of [facility] opinion at [facility]  She saw CARDS at [facility]> reviewed in [facility] [place] w/ NSR, wnl, NAD; prev 2DEcho 04/2014 showed norm LVF w/ EF=55-60%, norm wall motion, G1DD, RVF was also wnl; she had some chest discomfort during [facility]Rehab at [facility] recommended Myoview but pt never followed thru due to dyspnea & O2 requirements; they also considered [facility]HC but she never returned as requested...    She has seen DrZSmith for SportsMed/ PM&R> c/o back pain & leg pain, his note is reviewed... EXAM shows Afeb, VSS, O2sat=91% on RA in office;  HEENT- nasal congestion, erythema, turbinate hypertrophy, dev septum, throat= Mallampati 2, sl red, no exudates or lesions;  Chest- decr BS bilat w/o w/r/r & no signs of consolidation;  Heart- RR w/o m/r/g;  Abd- Obese, panniculus, soft/ nontender;  Ext- VI w/ trace edema;  Psyche- very distraught...  Ambulatory Oximetry on 3L/min [place]>  O2sat=96% on 3L/min at rest w/ HR=107/min;  She ambulated 1 Lap on 3L/min w/ nadir O2sat=89% w/ HR=125/min...  Ambulatory Oximetry on 3L/min w/ pendant oxymizer>  O2sat=99% on 3L/min at rest w/ pulse=98/min;  She ambulated one lap & stopped due to dyspnea w/ nadir O2sat=91% w/ HR=123/min... IMP/PLAN>>  She will try the Oximizer at home, switch from zoloft to TRAZODONE 100mg-8m2 to 1 tab Qhs, try to wean MEDROL 4mg is651mle to 4mg Qod65m increments; rov in 3 months time...  ~  Jul 09, 2015:  51mo ROV 751mol continues to complain about everything- productive cough w/ lots of clear sput, chr stable DOE w/ min activ, denies CP- says she's doing well w/ her NEB Rx;  After he last visit w/ prescribed an Oximizer but she says "  no help at all"; we tried Trazodone100 Qhs for sleep but she tells me she didn't need it; we discussed weaning down the dose of Medrol (she was on 61m/d) and she was able to diminish this to 433mod but symptoms flaired & she went right back up to 70m27mam again; she is taking gen Guaifenesin 400m22mbs- 8/d in divided doses + one gal fluids daily- this helps her cough & congestion;  Offered referral to [facility] for a 3rd opinion regarding her COPD and poss transplant eval but says she was told she had to lose 100# first ...  She wants a thorough THYROID & pituitary eval w/ "reverse T3" checked=> we will refer to Endocrine;  She says her hands are cramping- asked to try soaking hands in hot water but she has several reasons why she can't get to the sink...     Hx mild OSA w/ mod desat, loud snoring, & leg jerks (on sleep study 2005)> she tried CPAP10 but stopped on her own & declined to repeat split night study & re-try CPAP rx; she says she's sleeping satis & wakes feeling refreshed...    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 1993; supposed to be on HomeO2- 2L rest & 4L exercise, NEBS w/ Duoneb vs Xopenex Tid, Symbicort160-2spBid, Spiriva daily, Mucinex1-2Bid but not taking anything regularly; states that Flutter causes "spasms" in her back; states O2 doesn't help since she can't breathe thru her nose (she has been eval by ENT, CrosErnesto Rutherfordinally tried [facility] stopped after 32mo 54mofruitless"; prev stated she "can't breathe at all, can't do anything" etc; SOB w/ ADLs etc; Note> [facility]- last PFT (2011) showed FEV1= 1.5 (55%) &  %1sec=59 ==> 3/15 she announced that she's been cured by taking a Magnesium supplement, along w/ herbal formula "Clear lungs" and "Lung tonic"; she had stopped her Oxygen;  Now using O2 up to 15L/min on her own since she can't breathe thru her nose- advised 2L/min rest & 4L/min exercise + humidity, nasal hygiene, etc; on MEDROL4-70mg/d90m  Hx AtypCP> baseline EKG w/ minor NSSTTWA; 2DEcho w/ norm LVF, EF=55-60%, Gr1DD, norm valves, norm LA/RV; Myoview 2009 normal w/o ischemia & EF=55%; she notes some atypCP & saw DrNishCherly [name] she declined Myoview; [facility] [place] until she'd had cardiac eval- she declined...    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    Marland KitchenMarland Kitchenypothyroid> not currently on meds but she really wants thyroid Rx; prev TFTs all wnl; she prev had Endocrine in [place] prescribe Synthroid for her but she stopped going; clinically & biochemically euthyroid; Labs 2/16 showed TSH=0.39 & FreeT4=1.54    Morbid Obesity> peak wt~340# before lap band surg 2009 by DrMMartin; lost to ~250# but then back up to 280-90#; refused f/u w/ [facility] due to cost of visits & lap-band adjustments; we reviewed diet & exercise- Wt betw 255-265#in last yr.    GI- GERD, IBS> she uses OTC PPI as needed; last colonoscopy was 1982 by DrPatterson & wnl, she refuses GI referral & refuses f/u colon etc...    GYN- s/p uterine cancer>  s/p lap hyst & [facility]SO [facility]Sk[facility]...    DJD, ?FM, VitD defic> prev on Vicodin, Tramadol, Aleve, VitD; prev eval by DrDeveshwar for Rheum; now she states that [name] ("horse linament") applied topically has worked like a miracle & she  has increased exercise etc...    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 EXAM shows Afeb, VSS, O2sat=91% on RA in office;  HEENT- nasal congestion, erythema, turbinate hypertrophy, dev septum, throat= Mallampati 2, sl red, no exudates or lesions;  Chest- decr BS bilat w/o w/r/r & no signs of consolidation;  Heart- RR w/o m/r/g;  Abd- Obese, panniculus, soft/ nontender;  Ext- VI w/ trace edema;  Psyche- very distraught...  SHE NEEDS F/U LABS IMP/PLAN>>  GArtis Delaywants an Endocrine eval & we will refer to endocrinologist;  Needs f/u labs but she wants the endocrine eval;  Advised to try to wean the Medrol to 454mQam;  She must lose the weight & then reassess & consider referral to [facility];  We [plan ROV in 73m109moeminded to bring al mmed bottles to every office visit.  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    She  has mult somatic complaints> "I have a weak back"; now on a diet- eating 1 meal/d & DrLurie is concerned that she isn't eating enough (wt up 5# to 271#); she says she has done her internet research & found a blogger who has COPD & improved w/ CBD oil (hemp oil) "it's pharmaceutical grade out of [place] contain any THC"; she places 20mg unde39mr tongue & it's supposed to help her lungs- says it's working as she has been able to wean down her oxygen she says "I'm keeping a diary"...    She saw [name] ENT 10/02/15> on CPAP, nasal septal deviation, cerumen impactions removed...     We reviewed her prescription meds>  InsHovnanian Enterprisescover xopenex/ levalbuterol; on Medrol 4mg- weane62merself down to 4-2-4-2 Qod, Symbicort160-2spBid, Spiriva daily, NEBS w/ Duoneb (but she has cut down to just once daily), Ventolin-HFA prn (she likes this & uses it 3-4x daily), Mucinex 600mg- uses 873m as needed EXAM shows Afeb, VSS, O2sat=88% on RA in office;  Wt=271#; HEENT- nasal congestion, erythema, turbinate hypertrophy, dev septum, Throat= Mallampati 2, sl red, no exudates or lesions;  Chest- decr BS  bilat w/o w/r/r & no signs of consolidation;  Heart- RR w/o m/r/g;  Abd- Obese, panniculus, soft/ nontender;  Ext- VI w/ trace edema;  Psyche- remains distraught... IMP/PLAN>>  She is excited about the CBD oil Rx off the internet & has weaned down her O2 and Medrol;  I pointed out her resting RA O2sat=88% today in the office 7 reminded to stay on her O2 regularly 2L/nmin rest 7 4L/min w/ exercise;  She has weaned her Medrol to 42m alt w/ 225mqod and anxious to go lower;  Reminded to stay on her regular med regimen all the time (see above)...   ~  February 10, 2016:  69m40moV & GilArtis Delayturns w/ mult somatic complaints- worse SOB but then tells me that her breathing is much better due to a special oil she is using, "I read on-line about NOT increasing oxygen when you are breathless", c/o decr hearing "it's coming from  inside my head" & [facility]; [place] & we discussed the role of her hi flow O2- rec SALINE nasal mist frequently; pt called us [place]17 & said that her breathing was doing fantastic on Medrol reduced to 2mg35m, since then she has weaned OFF the Medrol on her own & notes "my lungs are dry & clear" and  "I solved my oxygen prob w/ long nasal prongs and saline spray"... HER CC TODAY IS A PROB w/ [facility] AS THEY WON'T SUPPLY HER w/ E-TANKS LIKE SHE WANTS THEM; we reviewed the following medical problems during today's office visit >>     Hx mild OSA w/ mod desat, loud snoring, & leg jerks (on sleep study 2005)> she tried CPAP10 but stopped on her own & declined to repeat split night study & re-try CPAP rx; she says she's sleeping satis & wakes feeling refreshed, does not want to re-assess her sleep...    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 1993; supposed to be on HomeO2- 2L rest & 4L exercise, NEBS w/ Duoneb Tid, Symbicort160-2spBid, Spiriva daily, GFN400- encouraged to use 2Tid w/ fluids; she stopped Medrol on her own recently; states that Flutter causes "spasms" in her back; states O2 doesn't help since she can't breathe thru her nose (she has been eval [facility], [facility]); finally tried PulmRehab but stopped after 15mo 815mofruitless"; prev stated she "can't breathe at all, can't do anything" etc; SOB w/ ADLs etc; Note> [facility] and [facility]- last PFT (2011) showed FEV1= 1.5 (55%) & %1sec=59 ==> 3/15 she announced that she's been cured by taking a Magnesium supplement, along w/ herbal formula "Clear lungs" and "Lung tonic"; she had stopped her Oxygen;  Now using O2 up to 15L/min on her own since she can't breathe thru her nose- advised 2L/min rest & 4L/min exercise + humidity, nasal hygiene, etc...     Hx AtypCP> baseline EKG w/ minor NSSTTWA; 2DEcho w/ norm LVF, EF=55-60%, Gr1DD, norm valves, norm LA/RV; Myoview  2009 normal w/o ischemia & EF=55%; she notes some atypCP & saw DrNisCherly [name], she declined Myoview; [facility] [place] until she'd had cardiac eval- she declined...    Ven Insuffic> on Lasix40 (1or2 prn edema); she knows not to eat sodium, elev legs, wear support hose; Labs 2/16 showed Cr=1.2    Impaired Gluc Tolerance, Metabolic syndrome> she refused meds; prev eval by DrEllison  w/ rec for Metformin but she rejects DM diagnosis & refused meds; states "I'm eating healthier" (advised low carb low fat); Labs 2/16 showed BS~160, A1c=6.5.Marland KitchenMarland Kitchen    ?Hypothyroid> now followed by Dr.Smith in [place]; she is not on thyroid medication but she says he wants her off the Medrol so she weaned off this med on her own; prev TFTs all wnl; she prev had Endocrine in [place] prescribe Synthroid for her but she stopped going; clinically & biochemically euthyroid; Last Labs here 11/16 showed TSH=1.17 & FreeT4=1.05; Last note from [name] was 08/2015- we do not have further notes...    Morbid Obesity> peak wt~340# before lap band surg 2009 by DrMMartin; lost to ~250# but then back up to 280-90#; refused f/u w/ [facility] due to cost of visits & lap-band adjustments; we reviewed diet & exercise- Wt betw 255-265# in 2017...    GI- GERD, IBS> she uses OTC PPI as needed; last colonoscopy was 1982 by DrPatterson & wnl, she refuses GI referral & refuses f/u colon etc...    GYN- s/p uterine cancer> s/p lap hyst & BSO [facility]Skinner [facility]...    DJD, ?FM, VitD defic> prev on Vicodin, Tramadol, Aleve, VitD; prev eval by DrDeveshwar for Rheum; now she states that [name] ("horse linament") applied topically has worked like a miracle & she has increased exercise etc...    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 EXAM shows Afeb, VSS, O2sat=93% on RA in office;  HEENT- sl nasal congestion, erythema, turbinate hypertrophy, dev septum, throat= Mallampati 2, sl red, no exudates or lesions;  Chest- decr BS bilat w/o w/r/r & no signs of consolidation;  Heart- RR w/o m/r/g;  Abd- Obese, panniculus, soft/ nontender;  Ext- VI w/ trace edema...  LABS 02/10/16>  Chems- ok w/ BS=105, BUN=25, Cr=1.24 IMP/PLAN>>  Overall GArtis Delayis stable- she requests 90d refill prescriptions and we did this;  We reviewed her labs, XRays, & PFTs- rec to take meds regularly & not adjust on her own, rec to return to exercise program & consider pulm rehab; note: >50% of this 25 min f/u appt was spent on counseling & coordination of care...    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    [facility]  Chr nasal obstruction L>R (rec humidification & saline, Flonase, & breathe-right nasal strips) & hearing loss=> cerumen impactions removed & audiogram revealed hi freq hearing loss.    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 1993; supposed  to be on HomeO2 at 2-3L rest & 4+L exercise, NEBS w/ Duoneb Tid, Symbicort160-2spBid, Spiriva daily, GFN400- encouraged to use 2Tid w/ fluids; she stopped Medrol on her own; states that Flutter causes "spasms" in her  back; states O2 doesn't help since she can't breathe thru her nose (she has been eval by ENT- [name] & [facility]); finally tried PulmRehab but stopped after 43moas "fruitless"; prev stated she "can't breathe at all, can't do anything" etc; SOB w/ ADLs etc; Note> [facility]- last PFT (2011) showed FEV1= 1.5 (55%) & %1sec=59 ==> 3/15 she announced that she's been cured by taking a Magnesium supplement, along w/ herbal formula "Clear lungs" and "Lung tonic"; she had stopped her Oxygen;  Now using O2 up to 15L/min on her own since she can't breathe thru her nose- advised 2L/min rest & 4L/min exercise + humidity, nasal hygiene, etc;  Recently noted that her breathing improved by taking a "special oil" & she solved her oxygen prob w/ an O2 cannula w/ longer nasal prongs + saline;    EXAM shows Afeb, VSS, O2sat=91% on RA in office;  HEENT- sl nasal congestion, erythema, turbinate hypertrophy, dev septum, throat= Mallampati 2, sl red, no exudates or lesions;  Chest- decr BS bilat w/o w/r/r & no signs of consolidation;  Heart- RR w/o m/r/g;  Abd- Obese, panniculus, soft/ nontender;  Ext- VI w/ trace edema... IMP/PLAN>>  I wrote a prescription for a POC at her insistence;  We will check w/ other DME companies for her;  She is rec to continue the NEB w/ Duoneb tid followed by the Symbicort160-2spBid & the Spiriva once daily;  Maximize the GUAIFENESIN 4052mtabs- 2Tid w/ fluids & work to expectorate the phlegm;  She still needs a regular exercise program/ pulm rehab & we discussed this... we plan rov in 3-59m7mo   ADDENDUM 05/29/16 >> I received an office note from dr[facility]Holli [facility] indicating that the patient was transferring her care to their team...           Problem List:    OBSTRUCTIVE SLEEP APNEA (ICD-327.23) - sleep study 2005 showed RDI=15 w/ desat to 78%, loud snoring & +leg jerks> on CPAP 10, prev used intermittently but now not at all... ~   11/12:  She has agreed to a new Sleep Study- split night protocol, to recheck her OSA & determine CPAP needs (check ABG if poss too)==> she never followed thru w/ the study. ~  Pt states that she is resting satis and wakes refreshed, energy better... ~  12/15: she reports not resting due to left shoulder/ arm pain & we will refer to Ortho (she saw Dr. ZacGardenia Phlegm. ~  She continues to refuse repeat sleep study, CPAP, and declines f/u w/ sleep med...  [facility]> he is working to get her nares open to facilitate her breathing & nasal O2 Rx...  ~  Spring 2016> ENT eval by DrPlonk & his notes are reviewed> HxCOPD w/ revers component and chr nasal obstruction L>R, nasal crusting, bilat inferior nasal turbinate hypertrophy, etc;  They rec nasal saline, nasal steroid spray, mupirocin ointment, and breathe right nasal strips; they will consider surg if not improved...   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 supposed to be on NEBULIZER Qid, ADVAIR 500Bid + SPIRIVA daily (compliance is a serious issue- she freq stops all  meds) ==> she had a second opinion consult at [facility] by DrAdair in 2006... ~  A1AT level is normal 218 (83-200) 3/09... ~  baseline CXR w/ borderline Cor, COPD, interstitial scarring/ atelectasis/ obesity... ~  PFTs 3/07 showed FVC=2.74 (82%), FEV1=1.67 (62%), %1sec=61, mid-flows=27%pred... improved from 2005. ~  CTChest in 2007 & 4/09 showed severe centrilob emyphysema, + interstitial fibrosis, sm HH, left renal cyst... neg for PE... ~  2010:  breathing improved w/ weight reduction after Lap-band surg... ~  2/11: COPD exac w/ neg CXR (chr changes, NAD), Rx w/ Depo/ Pred/ Avelox/ Mucinex/ NEBS/ Advair/ Spiriva/ etc... ~  PFTs 3/11 showed FVC= 2.57 (73%), FEV1= 1.51 (55%), %1sec=59, mid-flows= 21%pred. ~  Noncompliant w/ Rx & O2 thru 2012... On disability since 2011 & finally got Medicare coverage  9/12... ~  11/12: presents w/ worsening breathing & dyspnea w/ ADLs ==> we outlined further eval w/ 2DEcho, Sleep Study; Rx w/ NEBS QID, Advair500Bid, Spiriva daily, Mucinex, Oxygen, Lasix, Zoloft, Xanax, etc (she declined to proceed w/ the sleep study, 2DEcho, etc)... 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  ~  4/13: she is c/o the nasal O2 not helping since it's hard to breathe thru nose & wants ENT referral==> saw [name] who wanted to do surg but she has severe COPD & hi risk; asked to start PulmRehab & she agrees... ~  10/13: she did PulmRehab for 57mo6-7/13 but quit since it was "fruitless"- barely got to do any exercise since it was so difficult getting to the facility,etc; still c/o nasal problems limiting her benefit from O2 & we discussed the poss of ?transtracheal oxygen? ~  9/14: exsmoker quit 1993; supposed to be on HomeO2- 2L rest & 4L exercise, NEBS w/ Albut, Advair500Bid, Spiriva daily, Mucinex1-2Bid but not taking anything regularly; states that Flutter & Mucinex cause "spasms" in her back; states O2 doesn't help since she can't breathe thru her nose (she has been eval by ENT, [facility]; [facility] "[facility]"; states she "can't breathe at all, can't do anything" etc; SOB w/ ADLs=> O2 recert today (see below)... Note> [facility] and last PFT (2011) showed FEV1= 1.5 (55%) & %1sec=59... OXYGEN RE-CERT done today... ~  3/15: see above- pt states that a magnesium supplement along w/ herbal supplements "clear lungs" and "lung tonic" have made all the difference & she no longer needs oxygen, exercising regularly & much improved; still taking Advair500, Spiriva, AlbutHFA prn...  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 ~  CXR 9/15 showed COPD/emphysema, no acute  abnormality... ~  11/07/13: Ambulatory O2 Test 9/15> O2 sat on RA at rest= 92% w/ pulse=86;  Nadir O2sat on RA after 2 laps= 84% w/ pulse=96...  ~  11/17/13: she presented w/ acute SOB, wheezing, COPD exac> treated w/ NEBS, Depo120, Pred taper, Mucinex1200Bid, fluids, & continue the Spiriva & Oxygen; ch her Advair to NEBS w/ Xopenex & Budesonide regularly... ~  10/15: she is much improved w/ Pred taper but she wants off; reminded to use NEBS w/ Xopenex & Budes Bid every day... ~  12/15: she is off the Pred, but also stopped the Xopenex/ Budesonide due to cost; she has gone back on her Advair500 & Spiriva, plus Albut via NEBS, Mucinex/ Fluids/ etc... ~  [facility] NoCherrie Gauze  Hosp w/ COPD exac> disch on Oxygen, Pred taper, Levaquin750, NEBS w/ Xopenex, Advair500, Spiriva, Mucinex, etc... ~  3/16: post hosp check, very frustrated that she can't get things the way SHE wants them; c/o O2 since she can't breathe thru nose & we reviewed nasal hygiene, huidified O2, ENT re-eval; on Pred taper, off Levaquin now, Advair500Bid, Spiriva daily, NEBS w/ Xopenex Bid, Mucinex (not taking regularly & asked to do 2Bid + fluids); we will try [facility] COPD program & recheck pt in 3 weeks... ~  4/16: she continues to be very frustrated w/ her disease & her care- can't breathe thru her nose & she feels the O2 is not doing her any good; we discussed refer to [place] for their review to see if there is anything they can do.  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 ~  PFT 08/21/14 showing FVC=1.63 (50%), FEV1=0.63 (25%), %1sec=39;  FEV1 post bronchodil= 0.97 (39%) for a 50% improvement;  DLCO was 22% predicted...  ~  CTChest 08/21/14 showed severe centrilobular emphysema, diffuse bronchial thickening, mild mucus plugging, no adenopathy, no lung nodules etc; also showed mild Ao & coronary calcif, lap band at GE junction, splenosis, DJD Tspine. ~  Summer 2016: She had ENT & PULM evaluations at [facility], continued to see DrStephens (PulmFellow w/ DrHaponik)  ATYPICAL CHEST PAIN (ICD-786.50) - eval by DrWall in 2009. ~  baseline EKG w/ NSR, PVC's, NSSTTWA, NAD... ~  2DEcho 2/05 showed mild MR/ TR, norm LA/ RV, EF=50-55%... ~  NuclearStressTest 4/09 was norm- no ischemia, EF=55%... similiar to prev studies at [place] & 2007... ~  3/15: she notes some atypCP & requests a cardiac referral for eval- prev seen by DrWall, we will call for Cards consult per her request... ~  EKG 3/15 showed NSR, rate91, occas PVCs, otherw wnl... ~  4/15: she had Cards eval DrNishan> he did not feel that invasive testing was warranted; she had 2DEcho but refused Myoview due to co-pay costs...  ~  2DEcho 4/15 showed norm LV sys function w/ EF=60-65%, normal wall motion, Gr1DD, normal valves, normal RV & PA pressures... ~  EKG 2/16 showed NSR, rate88, low voltage, otherw wnl... ~  2DEcho 2/16 showed norm LVF w/ EF=55-60%, Gr1DD, norm valves and norm RV.  VENOUS INSUFFICIENCY, CHRONIC (ICD-459.81) - Hx chr ven insuffic w/ edema... on LASIX 40m- 1-2 daily (she self medicates)... ~  Labs 3/15 showed Cr=1.5 and she is asked to decr the Lasix to 462mQam...  IMPAIRED GLUCOSE TOLERANCE >>  METABOLIC SYNDROME X (ICGUY-403.4- she is on diet alone (refused Metformin therapy)... ~  labs 9/08 showed BS=120... FBS 5/08 was 93, HgA1c=7.1 ~  labs 5/10 showed BS= 182, A1c= 6.4 ~  labs 2/11 showed BS= 76, A1c= 6.5 ~  Labs 5/12 showed BS= 103, A1c= 6.5 ~  She saw DrEllison 4/13 w/ A1c=7.1 but she rejects the diagnosis of Diabetes & refused Metform50038m... ~  10/13:  We discussed IMPAIRED GLUCOSE TOLERANCE & need for low  carb wt reducing diet and incr exercise... 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 ~  max weight ~340# before lap band surg 7/09 by DrMartin. ~  weight 12/09 = 291# ~  weight 5/10 = 265# ~  weight 11/10- = 254# ~  weight 2/11 = 260# ~  Weight 5/12= 290# ~  Weight 11/12 = 286# ~  Weight 4/13 = 280# => BMI=45 ~  Weight 10/13 = 289# ~  Weight 9/14 = 281# ~  Weight 3/15 = 270# ~  Weight 9/15 = 267# ~  Weight 12/15 = 274# ~  Weight 3/16 = 255# => post hosp... ~  Weight 4/16 = 271# ~  Weight 7/16 = 272# ~  Weight 11/16 = 276#  ? Hx HYPOTHYROIDISM (ICD-244.9) - off thyroid meds since 1/10... she was started on Synthroid and followed by DrRSmith in [place] & last seen 7/09- note reviewed... she refuses f/u by [name]- we will recheck TSH off Synthroid Rx... ~  labs 5/10 off Synthroid since 1/10 showed TSH= 1.13 ~  labs 2/11 showed TSH= 1.82 ~  Labs 5/12 showed TSH= 1.81 ~  2013: She wanted to restart Thyroid hormone but TSH remains normal> referred to Endocrine for further eval but she was upset w/ DrEllison's eval; she will decide if she wants to pursue another opinion... ~  9/14: not currently on meds but she really wants thyroid Rx; prev TFTs all wnl; she prev had Endocrine in [place] prescribe Synthroid for her but she stopped going; clinically & biochemically euthyroid...  ~  3/15: on Synthroid100-12 tab daily; Labs showed TSH= 3.00 ~  She stopped the Synthroid again... ~  Labs 2/16 not on meds showed TSH=0.39 & FreeT4=1.54   GERD  (ICD-530.81) - prev on Nexium but off all meds since 1/10... IRRITABLE BOWEL SYNDROME (ICD-564.1) - colonoscopy 1982 by DrPatterson was WNL... She has refused f/u GI eval & f/u colonoscopy...  ?KIDNEY STONE Hx >> pt said she passed a kidney stone 3/11 w/o any medical attention, w/o work up or proof of same; entry made here for future reference if needed.  UTERINE CANCER, HX OF (ICD-V10.42) - s/p laparoscopic hysterectomy & BSO 7/07 by DrSkinner at [place]...   DEGENERATIVE JOINT DISEASE (ICD-715.90) - on VICODIN tid prn,  ANAPROX 222m Bid prn, ULTRAM 573mPrn... ? 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 she was on Wellbutrin & Vicodin when last seen in 2006... VITAMIN D DEFICIENCY (ICD-268.9) - Vit D level 5/10 = 16... rec> start OTC Vit D 2000 u daily. ~  9/14: on Vicodin, Tramadol, Aleve, VitD, etc; prev eval by DrDeveshwar for rheum; now c/o pain in left knee (she thinks it's "siatica"), can't exercise & [facility]- ok... ~  3/15: she states that topical [name] ("horse linament") rubbed into knees really helps but she wants to keep the Vicodinon hand just in case...  Hx of HEADACHE (ICD-784.0)  DYSTHYMIA (ICD-300.4) - off Celexa,  off Zoloft, and on ALPRAZOLAM 0.31m37md as needed... she has seen psychiatry in the [place] w/ [place]  dis[place]... prev seeing psychologist [name] for eating disorder counselling... ~  4/13: she mentioned seeing [name] again for eating disorder & counseling for depression etc... 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  ~  3/15: she has the Alpraz0.58mfor prn use but seldom uses it; still sees [name] on occas but doesn't think she needs to  continue... ~  11/15:  She wants antidepressant med & we discussed Zoloft tria 10029m1/2 => 1 tab daily... She still sees Psychologist [name]. ~  3/16:  She is off the Zoloft & states the AlpSharin Grave too strong (advised cutting the med & try lower dose for the desired effect...  HEALTH MAINTENANCE:   ~  GI:  Per seen by DrPatterson but last colon was 1982 & she refuses follow up... ~  GYN:  She had [facility] hyst & [facility] Immuniz:  She had Pneumovax in the past; she declines Flu shots and Tetanus shots...   Past Surgical History:  Procedure Laterality Date  . APPENDECTOMY    . BILATERAL SALPINGOOPHORECTOMY  2007   [place]  . CHOLECYSTECTOMY  1982  . LAPAROSCOPIC GASTRIC BANDING  08/2007   Dr. MarHassell Done LAPAROSCOPIC HYSTERECTOMY  2007   [place]  . 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 albuterol (VENTOLIN HFA) 108 (90 Base) MCG/ACT inhaler INHALE 2 PUFFS INTO THE LUNGS EVERY 6 (SIX) HOURS AS NEEDED FOR WHEEZING OR SHORTNESS OF BREATH.  . AMarland KitchenPRAZolam (XANAX) 0.5 MG tablet TAKE 1/2 TO 1 TABLET(S) BY MOUTH THREE TIMES DAILY AS NEEDED FOR NERVES. NOT TO EXCEED 3 TABS/DAY.  . B Complex-C-Folic Acid (B-COMPLEX BALANCED) TABS Take 1 tablet by mouth daily.  . budesonide-formoterol (SYMBICORT) 160-4.5 MCG/ACT inhaler Inhale 2 puffs into the lungs 2 (two) times daily.  . cetirizine (ZYRTEC) 10 MG tablet Take 10 mg by mouth daily.    . Cholecalciferol (VITAMIN D-3) 5000 UNITS TABS Take 1 tablet by mouth daily.  . Coconut Oil 1000 MG CAPS Take 4 capsules by mouth daily.  . colchicine (COLCRYS) 0.6 MG tablet Take 1 tablet (0.6 mg total) by mouth daily.  . cyclobenzaprine (FLEXERIL) 10 MG tablet Take 1 tablet (10 mg total) by mouth 3 (three) times daily as needed for muscle spasms.  . furosemide (LASIX) 40 MG tablet TAKE 1-2 TABLETS ONCE A DAY AS NEEDED FOR SWELLING  . guaiFENesin (MUCINEX) 600 MG 12 hr  tablet Take 1,600 mg by mouth 2 (two) times daily.  . gMarland KitchenaiFENesin-codeine (CHERATUSSIN AC) 100-10 MG/5ML syrup Take 5 mLs by mouth every 4 (four) hours as needed for cough.  . iMarland Kitchenratropium-albuterol (DUONEB) 0.5-2.5 (3) MG/3ML SOLN Take 3 mLs by nebulization 3 (three) times daily.  . Magnesium 400 MG CAPS Take 2 capsules by mouth 2 (two) times daily.   . naproxen sodium (ANAPROX) 220 MG tablet Take 2 tabs by mouth once daily   . tiotropium (SPIRIVA HANDIHALER) 18 MCG inhalation capsule Place 1 capsule (18 mcg total) into inhaler and inhale daily.  . traMADol (ULTRAM) 50 MG tablet Take 1 tablet (50 mg total) by mouth 3 (three) times daily as needed.  . Turmeric 500 MG TABS Take 1 tablet by mouth daily.  . vitamin C (ASCORBIC ACID) 250 MG tablet  Take 250 mg by mouth daily.   No facility-administered encounter medications on file as of 04/16/2016.     Allergies  Allergen Reactions  . Penicillins     REACTION: nausea and vomiting    Current Medications, Allergies, Past Medical History, Past Surgical History, Family History, and Social History were reviewed in [employer] record.   Review of Systems:         See HPI - all other systems neg except as noted... The patient complains of dyspnea on exertion and difficulty walking; chest discomfort, & leg weakness.  The patient denies anorexia, fever, weight loss, weight gain, vision loss, decreased hearing, hoarseness, syncope, prolonged cough, headaches, hemoptysis, abdominal pain, melena, hematochezia, severe indigestion/heartburn, hematuria, incontinence, suspicious skin lesions, transient blindness, depression, unusual weight change, abnormal bleeding, enlarged lymph nodes, and angioedema.    Objective:   Physical Exam:    WD, Morbidly Obese, 66 y/o WF in no acute distress but c/o dyspnea/ wheezing... GENERAL:  Alert & oriented; pleasant & cooperative... HEENT:  [place]/AT, EOM-wnl, PERRLA, EACs-clear, TMs-wnl, NOSE-clear,  THROAT-clear & wnl. NECK:  Supple w/ fairROM; no JVD; normal carotid impulses w/o bruits; no thyromegaly or nodules palpated; no lymphadenopathy. CHEST:  no accessory musc use, decr BS bilat, no wheezing/ rales/ rhonchi... HEART:  Regular Rhythm; without murmurs/ rubs/ or gallops heard... ABDOMEN:  Obese, soft & nontender; normal bowel sounds; no organomegaly or masses detected. EXT: without deformities, mod arthritic changes; no varicose veins/ +venous insuffic/trace edema. NEURO:  CNs intact; no focal neuro deficits... DERM: few ecchymoses, no rash etc...  RADIOLOGY DATA:  Reviewed in the EPIC EMR & discussed w/ the patient...  LABORATORY DATA:  Reviewed in the EPIC EMR & discussed w/ the patient...   Assessment & Plan:    ENT>> she was working w/ DrPlonk at [employer] in [place]...  OSA>  As prev noted she needs new sleep study & appropriate new CPAP apparatus to facilitate compliance but she declines the repeat study or sleep med referral...  COPD/ Emphysema>  Acute exac 2/16 improved after hosp in [place] w/ Levaquin, Pred taper, NEBS Bid, Avair500, Spiriva, Mucinex etc... Encouraged to stay on her O2 regularly & we will add Humidity, needs max nasal hygiene vs ENT follow up; continue NEBS/ Advair500/ Spiriva regularly & use the Mucinex; we will request the [facility] home Care home COPD program; needs to lose wt & incr exercise... 4/16>  Spirometry w/ severe airflow obstruction and GOLD Stage 3-4 COPD; optimize meds and refer to [facility]- ENT & Pulm divisions to see if there is anything that they can do to help... 5/16>  She has seen ENT & Pulm 2nd opinion is pending; she cannot manipulate the port O2 tanks and we will request an INOGEN One- port O2 concentrator for her use...  5/16>  States breathing worse, asked to use all regular meds regularly & we will add Depo120, Medrol taper... 6/16>  She had 2nd opinion consult w/ DrStephens/ DrHaponik at [facility] see above... 10/16>  Pt very  frustrated after working w/ the [employer] division at [employer] for several months- Nebs, Advair500, Spiriva, Medrol, Oxygen, pulm rehab, etc...  11/16>  On Medrol16m- she incr herself to 666md, Symbicort160-2spBid, Spiriva daily, Xopenex NEBS Tid (only doing it once/d), Guaifenesin400- taking 6/d w/ fluids, O2- she is not using it at rest, using 5L/min w/ exercise; she takes Lasix40, Tamadol50, & Xanax0.5 as needed... 8/17>  She is now taking an internet supplement CBD oil (hemp oil) under her tongue  daily- "it's good for your lungs" & she has weaned down her O2 and Medrol; Advised to continue O2- 2L/min at rest & 4L/min w/ exercise and take her other meds regularly...  02/10/16>   Overall Artis Delay is stable- she requests 90d refill prescriptions and we did this;  We reviewed her labs, XRays, & PFTs- rec to take meds regularly & not adjust on her own, rec to return to exercise program & consider pulm rehab. 04/16/16>    I wrote a prescription for a POC at her insistence;  We will check w/ other DME companies for her;  She is rec to continue the NEB w/ Duoneb tid followed by the Symbicort160-2spBid & the Spiriva once daily;  Maximize the GUAIFENESIN 482m tabs- 2Tid w/ fluids & work to expectorate the phlegm;  She still needs a regular exercise program/ pulm rehab & we discussed this... we plan rov in 3-417mo Hx CP w/ neg cardiac evals including prev Myoviews; 2DEchos w/o incr PA pressures... 4/15> she had Cardiology consult for eval of AtypCP (DCherly [name] EKG was WNL & 2DEcho showed norm LVF, Gr1DD & norm RV & PA pressures... 2/16> [facility], valves and norm RV...  OBESITY>  She had remote lap band surg by DrMMartin; Met syndrome, must get wt down etc; she is a lap band failure & prev counseling by [name]... 3/16> weight down to 255# post hosp... 4/16> weight is back up to 271# despite all efforts... 8/17> weight = 271#  ? Hx Hypothy>  TFTs remain normal off meds- no  problem... 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  GI>  GERD> she denies symptoms & uses OTC meds prn...  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 C/O right ankle pain, eval by DrZSmith> no better on his anti-inflamm rx so we will Rx w/ Depo120 & Medrol taper...  Dysthymia>  Much stress in her life, on Alpraz prn; has embraced alt lifestyle...   Patient's Medications  New Prescriptions   AZITHROMYCIN (ZITHROMAX) 250 MG TABLET    Take as directed  Previous Medications   ALBUTEROL (VENTOLIN HFA) 108 (90 BASE) MCG/ACT INHALER    INHALE 2 PUFFS INTO THE LUNGS EVERY 6 (SIX) HOURS AS NEEDED FOR WHEEZING OR SHORTNESS OF BREATH.   ALPRAZOLAM (XANAX) 0.5 MG TABLET    TAKE 1/2 TO 1 TABLET(S) BY MOUTH THREE TIMES DAILY AS NEEDED FOR NERVES. NOT TO EXCEED 3 TABS/DAY.   B COMPLEX-C-FOLIC ACID (B-COMPLEX BALANCED) TABS    Take 1 tablet by mouth daily.   BUDESONIDE-FORMOTEROL (SYMBICORT) 160-4.5 MCG/ACT INHALER    Inhale 2 puffs into the lungs 2 (two) times daily.   CETIRIZINE (ZYRTEC) 10 MG TABLET    Take 10 mg by mouth daily.     CHOLECALCIFEROL (VITAMIN D-3) 5000 UNITS TABS    Take 1 tablet by mouth daily.   COCONUT OIL 1000 MG CAPS    Take 4 capsules by mouth daily.   COLCHICINE (COLCRYS) 0.6 MG TABLET    Take 1 tablet (0.6 mg total) by mouth daily.   CYCLOBENZAPRINE (FLEXERIL) 10 MG TABLET    Take 1 tablet (10 mg total) by mouth 3 (three) times daily  as needed for muscle spasms.   FUROSEMIDE (LASIX) 40 MG TABLET    TAKE 1-2 TABLETS ONCE A DAY AS NEEDED FOR SWELLING   GUAIFENESIN (MUCINEX) 600 MG 12 HR TABLET    Take 1,600 mg by mouth 2 (two) times daily.   GUAIFENESIN-CODEINE (CHERATUSSIN AC) 100-10 MG/5ML SYRUP    Take 5 mLs by mouth every 4  (four) hours as needed for cough.   IPRATROPIUM-ALBUTEROL (DUONEB) 0.5-2.5 (3) MG/3ML SOLN    Take 3 mLs by nebulization 3 (three) times daily.   MAGNESIUM 400 MG CAPS    Take 2 capsules by mouth 2 (two) times daily.    NAPROXEN SODIUM (ANAPROX) 220 MG TABLET    Take 2 tabs by mouth once daily    TIOTROPIUM (SPIRIVA HANDIHALER) 18 MCG INHALATION CAPSULE    Place 1 capsule (18 mcg total) into inhaler and inhale daily.   TRAMADOL (ULTRAM) 50 MG TABLET    Take 1 tablet (50 mg total) by mouth 3 (three) times daily as needed.   TURMERIC 500 MG TABS    Take 1 tablet by mouth daily.   VITAMIN C (ASCORBIC ACID) 250 MG TABLET    Take 250 mg by mouth daily.  Modified Medications   No medications on file  Discontinued Medications   No medications on file

## 2016-05-30 NOTE — Addendum Note (Signed)
Addended by: Noralee Space on: 05/30/2016 12:44 PM   Modules accepted: Level of Service

## 2016-06-02 ENCOUNTER — Telehealth: Payer: Self-pay | Admitting: Pulmonary Disease

## 2016-06-02 NOTE — Telephone Encounter (Signed)
Per SN: 1- take the Duoneb TID scheduled  2- Take the spiriva daily and symbicort 2 p bid  3- take the guaifenesin 400 mg 2 tid  4-drink plenty of fluids  5-get back on the medrol 8 mg qam- call in # 30 no refills 6-continue the "clear lungs", "lung tonic", and magnesium supplement   LMTCB

## 2016-06-02 NOTE — Telephone Encounter (Signed)
Pt c/o worsening sinus congestion, pnd, mostly nonprod cough.  Feels like she has chest congestion but is unable to cough up anything. States "mucus is blocking my bronchial tubes so I cannot get a deep breath".  Denies fever, chest pain.   Pt was given zpak on 3/14 by SN which has not helped symptoms.  Pt is still taking mucinex as well as using nasal saline.  Requesting further recs.    Pt uses [facility] in [place].   SN please advise on recs.  Thanks.

## 2016-06-02 NOTE — Telephone Encounter (Signed)
Per SN- we received a letter and ov note from Dr. Ace Gins at [facility] stating that she is being followed there  Per SN- she should give them a call   Called and spoke with the pt about this  She states that this is incorrect  She was seen only 1 time and it was strictly for a COPD research study  She states that she did not meet the criteria for the study and will not be following up there  Please advise, thanks

## 2016-06-04 NOTE — Telephone Encounter (Signed)
lmomtcb x 2  

## 2016-06-05 MED ORDER — METHYLPREDNISOLONE 8 MG PO TABS: 8.0000 mg | ORAL_TABLET | Freq: Every day | ORAL | 0 refills | Status: DC

## 2016-06-05 NOTE — Telephone Encounter (Signed)
Spoke with pt, aware of recs.  States she has restarted zyrtec which has helped her symptoms.   Medrol called in to pharmacy.  Nothing further needed.

## 2016-06-09 ENCOUNTER — Other Ambulatory Visit: Payer: Self-pay | Admitting: Pulmonary Disease

## 2016-06-09 ENCOUNTER — Telehealth: Payer: Self-pay | Admitting: Pulmonary Disease

## 2016-06-09 NOTE — Telephone Encounter (Signed)
ATC pt but it stated my call could not be completed, tried twice, will try to call her back

## 2016-06-10 MED ORDER — GUAIFENESIN-CODEINE 100-10 MG/5ML PO SYRP: 5.0000 mL | ORAL_SOLUTION | Freq: Four times a day (QID) | ORAL | 0 refills | Status: DC | PRN

## 2016-06-10 NOTE — Telephone Encounter (Signed)
SN please advise on refill.  Thanks. 

## 2016-06-10 NOTE — Telephone Encounter (Signed)
Called in refill on Cheratussin and pt aware  Nothing further needed

## 2016-06-10 NOTE — Telephone Encounter (Signed)
Pt is requesting refill on cheratussin AC 100-10mg  /ml. Last refilled on 06-25-15 # 432ml with 0 refills. Pt reports of prod cough with clear mucus that occurs mainly at night at with laying flat x3w. Pt denies any wheezing, fever, chills or sweats.  Pt states sob is baseline.   SN please advise. Thanks.

## 2016-06-10 NOTE — Telephone Encounter (Signed)
Per SN---  Ok to refill the cheatussin  1 pint   1 tsp every 6 hours as needed for cough.  No refills. thanks

## 2016-06-17 ENCOUNTER — Ambulatory Visit: Payer: Self-pay | Admitting: Pulmonary Disease

## 2016-07-15 ENCOUNTER — Ambulatory Visit (INDEPENDENT_AMBULATORY_CARE_PROVIDER_SITE_OTHER): Payer: Medicare Other | Admitting: Pulmonary Disease

## 2016-07-15 ENCOUNTER — Encounter: Payer: Self-pay | Admitting: Pulmonary Disease

## 2016-07-15 VITALS — BP 136/76 | HR 92 | Ht 65.0 in | Wt 255.4 lb

## 2016-07-15 DIAGNOSIS — J9611 Chronic respiratory failure with hypoxia: Secondary | ICD-10-CM

## 2016-07-15 DIAGNOSIS — F341 Dysthymic disorder: Secondary | ICD-10-CM | POA: Diagnosis not present

## 2016-07-15 DIAGNOSIS — M150 Primary generalized (osteo)arthritis: Secondary | ICD-10-CM | POA: Diagnosis not present

## 2016-07-15 DIAGNOSIS — J449 Chronic obstructive pulmonary disease, unspecified: Secondary | ICD-10-CM

## 2016-07-15 DIAGNOSIS — M159 Polyosteoarthritis, unspecified: Secondary | ICD-10-CM

## 2016-07-15 DIAGNOSIS — G4733 Obstructive sleep apnea (adult) (pediatric): Secondary | ICD-10-CM | POA: Diagnosis not present

## 2016-07-15 NOTE — Progress Notes (Signed)
HPI  Review of Systems  Physical Exam Patient ID: Rebecca [name], female   DOB: 06-21-1950, 66 y.o.   MRN: [id number]   Subjective:   HPI 66 y/o WF known to me w/ mult med problems as noted below...  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  LABS 3/15:  Chems- BS=95 A1c=6.8 (needs low carb diet, exercise, wt loss) & Creat=1.5 (mild renal insuffic due to Lasix rx; Rec to decrease Lasix40 to 1 tab Qam...  2DEcho 4/15 showed norm LV sys function w/ EF=60-65%, normal wall motion, Gr1DD, normal valves, normal RV & PA pressures...   she had Cards eval Rebecca [name] 4/15> he did not feel that invasive testing was warranted; she had 2DEcho but refused Myoview due to co-pay costs...   She remains on ADVAIR500Bid, SPIRIVA daily, & ProairHFA prn (uses 3-4 x daily "It really helps"); has Home O2 but dislikes the Liq O2 therefore not using & she wants a portable O2 concentrator to read 4L/min w/ exercise, 2L/min at rest.   She has continued to research her supplements and herbal meds>  She tells me that she is living on smoothies she makes out of juice, ice, baking soda, pomegranate syrup, & local honey;  Tumeric helps the arthritis in her knees and she notes that pinching her upper lip in the middle under her nose helps relieve musc cramps in her legs.  Ambulatory O2 Test 9/15>  O2 sat on RA at rest= 92% w/ pulse=86;  Nadir O2sat on RA after 2 laps= 84% w/ pulse=96...   CXR 9/15 showed COPD/emphysema, no acute abnormality...  LABS 9/15:  Chems- ok w/ BS=119, A1c=6.7, Cr=1.4;  CBC- ok...  2/16 [facility] records indicated a thorough evaluation & excellent care provided but treatment plan was hampered by noncompliance- refused CPAP, declines chestPT, alternately stopping her O2 & demanding more O2,   LABS 2/16:  ABG- pH=7.46, pCO2=35, pO2=134 on 6L/min; Chems- ok x BS=169, A1c=6.5;  CBC- normal; TSH=0.39 & FreeT4=1.54; Troponin/ D-dimer/ BNP- all wnl...  CXR 2/16 showed heart at upper lim of norm, hyperinflation, min bibasilar atx, NAD.Marland KitchenMarland Kitchen   EKG 2/16 showed NSR, rate88, low voltage, otherw wnl...  2DEcho 2/16 showed norm LVF w/ EF=55-60%, Gr1DD, norm valves and norm RV...   CXR 2/16 showed heart at upper lim of norm, hyperinflation, min bibasilar atx, NAD  Spirometry 06/2014 showed FVC=1.61 (50%), FEV1=0.88 (35%), %1sec=55, mi-flows are reduced to 18% predicted... 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  LABS 5/16:  Chems- wnl w/ BS=90, Cr=1.02;  Mg=2.2, Ca=9.0;  Uric=6.6.Marland KitchenMarland Kitchen   ~  September 04, 2014:  6wk ROV & Rebecca [name] has had ENT & Pulm evaluations at [facility] per our requests>>      ENT eval by Rebecca [name] at [facility] & his notes are reviewed> HxCOPD w/ revers component and chr nasal obstruction L>R, nasal crusting, bilat inferior nasal turbinate hypertrophy, etc;  They rec nasal saline, nasal steroid spray, mupirocin ointment, and breathe right nasal strips; they will consider surg if not improved...       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 CTChest 08/21/14 showed severe centrilobular emphysema, diffuse bronchial thickening, mild mucus plugging, no adenopathy, no lung nodules etc; also showed mild Ao & coronary calcif, lap band at GE junction, splenosis, DJD Tspine... PLAN> They decided  to slowly wean the Medrol, continue Advair & wean to Advair250Bid, continue Spiriva, continue NEBS & AlbutHFA; rec to continue her oxygen (but she continues to use it "prn"), consider repeat sleep study & CPAP titration, consider referral to [place] rehab, referred for nutritional counseling & ROV 69mo..      She reports breathing well recently, she has a new roommate, eating out a lot, went to "theclub", then one day over the weekend=> incr SOB, she's been taking Lasix80 Qam, rambling hx w/ flight of ideas regarding coffee, caffeine, not urinating enough, she had to use [facility] transport [place];  They decreased her Advair250-Bid, Medrol685md, continue Nebs (she's using Bid because she "cramps up" if she does it tid); she is awaiting nutritional counseling, pulm rehab referral, oxygen titration testing, sleep study...  We reviewed prob list, meds, xrays and labs> see below for updates >>  PLAN>>  She will maintain close contact & f/u w/ [facility];  Reminded to avoid sodium & carbs- handout given; plan ROV here in 2-60m44mo  ~  December 05, 2014:  60mo7mo & Rebecca's new PCP is in [place]> she persists w/ 4+somatic complaints and signif health related anxiety; [facility] "they kicked me out due to CP- I can't go back until I get this evaluated"; she's been seeing Dr. SaraGermain Osgoodlm Fellow [facility] & I have reviewed all the Care Everywhere notes-- several telephone encounters including their last 45 min conversation well documented, not much reversibility, they hoped for small improvement w/ Rehab exercise & staying on meds but she won't fill rx, can't afford, tearful/ frustrated/ depressed but refused psyche consult etc... Pt indicates that Rebecca [name] said there was nothing wrong w/ her heart & pt doesn't want cardiac eval; Rebecca [name] wants her to wean the Medrol but she's breathing better on 4mg/62mshe wants 6mg/d860m rec to compromise w/ 6mg al41m/ 4mg Qod22mhe wants a liq oxygen system again- she is quite adamant (being a former "nurse")Insurance claims handlershe needs 4-5L/min when active and tank only lasts 1-2H; she uses 4L/min Qhs as well; but O2sat=92% on RA at rest today; she refuses to go back to rehab, therefore encouraged to do it daily on her own; she has not pursued further bariatric considerations- she is working w/ Drlori's office regarding eating disorder "he doesn't think I'm depressed"...      IMP/PLAN>>  Rebecca has Rebecca Delayere airflow obstruction/ emphysema w/ little reversibility; Rec to take SYMBICORT160-2spBid & Spiriva daily; use Medrol 4mg tabs760mmg alt w67mmg Qod; X84mnex NEBS Tid;  Alprax 0.5mg - 1/2 t71m tab tid; ROV w/ me in 90mo...  ~  N24mober 7, 2016:  90mo ROV & pt 35momult complaints> head "stopped-up", ears congested, tried pseudophed but "it made me sick", doesn't want ENT f/u, rec to try Saline nasal mist & phenylephrine decongestant;  She  is discouraged by her weight, can't seem to lose any, she described in detail her meals from yest (we discussed calories "in" & "out", eat less, burn more), she says local Gym won't take her w/ oxygen;  She asked about Stem Cell treatment for COPD- I offered to send her to [facility] to inquire about this or lung transplant;  She is very frustrated- eating is an issue because when she fills up she can't breathe well due to her prev LapBand surg she says, if she belches she feels better...       Rec to continue Medrol657m- she incr herself to 646md, Symbicort160-2spBid, Spiriva daily, Xopenex NEBS Tid (only doing it once/d), Guaifenesin400- taking 6/d w/ fluids, O2- she is not using it at rest, using 5L/min w/ exercise; she takes Lasix40, Tamadol50, & Xanax0.5 as needed...   LABS 01/2015>  Chems- ok x Cr=1.52, BS=114;  CBC- ok x wbc=17K w/ left shift;  TSH=1.17;  Fe=78 (22%sat);  VitB12=864...   XRay [facility] spine 01/2015>  Mild ant wedge compression L1, DDD & anterolithesis L4 on L5,  poss right kidney stones...  ~  April 09, 2015:  57m19moV & Rebecca continues to complain about ever worsening breathing- DOE, dry cough, chest tightness, leg cramps, difficulty sleeping; notes good days and bad- "I got worked up over the election" she says; she thinks the zoloft is helping but she can't sleep w/ it- rec to try TRAZODONE instead (100m7m/2 to 1 tab Qhs);  Currently taking Oxygen that she adjusts herself, Medrol4mg/34mNEBS w/ Xopenex Tid (but she says insurance won't cover it), Symbicort160-2spBid, Spiriva daily, Lasix40 (just taking prn)... I suggested that she try the Oximizer=> given to pt to try...    See 08/2014 note above for summary of [facility] at [facility]  She saw CARDS at [facility]> reviewed in [facility] w/ NSR, wnl, NAD; prev 2DEcho 04/2014 showed norm LVF w/ EF=55-60%, norm wall motion, G1DD, RVF was also wnl; she had some chest discomfort during [facility]Rehab at [facility] recommended Myoview but pt never followed thru due to dyspnea & O2 requirements; they also considered [facility] but she never returned as requested...    She has seen DrZSmith for SportsMed/ PM&R> c/o back pain & leg pain, his note is reviewed... EXAM shows Afeb, VSS, O2sat=91% on RA in office;  HEENT- nasal congestion, erythema, turbinate hypertrophy, dev septum, throat= Mallampati 2, sl red, no exudates or lesions;  Chest- decr BS bilat w/o w/r/r & no signs of consolidation;  Heart- RR w/o m/r/g;  Abd- Obese, panniculus, soft/ nontender;  Ext- VI w/ trace edema;  Psyche- very distraught...  Ambulatory Oximetry on 3L/min [place]>  O2sat=96% on 3L/min at rest w/ HR=107/min;  She ambulated 1 Lap on 3L/min w/ nadir O2sat=89% w/ HR=125/min...  Ambulatory Oximetry on 3L/min w/ pendant oxymizer>  O2sat=99% on 3L/min at rest w/ pulse=98/min;  She ambulated one lap & stopped due to dyspnea w/ nadir O2sat=91% w/ HR=123/min... IMP/PLAN>>  She will try the Oximizer at home, switch from zoloft to TRAZODONE 100mg-71m2 to 1 tab Qhs, try to wean MEDROL 4mg is52mle to 4mg Qod68m increments; rov in 3 months time...  ~  Jul 09, 2015:  57mo ROV 91mol continues to complain about everything- productive cough w/ lots of clear sput, chr stable DOE w/ min activ, denies CP- says she's doing well w/ her NEB Rx;  After he last visit w/ prescribed an Oximizer but she  says "no help at all"; we tried Trazodone100 Qhs for sleep but she tells me she didn't need it; we discussed weaning down the dose of Medrol (she was on 62m/d) and she was able to diminish this to 456mod but symptoms flaired & she went right back up to 60m9mam again; she is taking gen Guaifenesin 400m60mbs- 8/d in divided doses + one gal fluids daily- this helps her cough & congestion;  Offered referral to [facility] for a 3rd opinion regarding her COPD and poss transplant eval but says she was told she had to lose 100# first ...  She wants a thorough THYROID & pituitary eval w/ "reverse T3" checked=> we will refer to Endocrine;  She says her hands are cramping- asked to try soaking hands in hot water but she has several reasons why she can't get to the sink...     Hx mild OSA w/ mod desat, loud snoring, & leg jerks (on sleep study 2005)> she tried CPAP10 but stopped on her own & declined to repeat split night study & re-try CPAP rx; she says she's sleeping satis & wakes feeling refreshed...    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 1993; supposed to be on HomeO2- 2L rest & 4L exercise, NEBS w/ Duoneb vs Xopenex Tid, Symbicort160-2spBid, Spiriva daily, Mucinex1-2Bid but not taking anything regularly; states that Flutter causes "spasms" in her back; states O2 doesn't help since she can't breathe thru her nose (she has been eval by ENT, CrosErnesto [facility]utherfordinally tried [facility] stopped after 20mo 57mofruitless"; prev stated she "can't breathe at all, can't do anything" etc; SOB w/ ADLs etc; Note> [facility] [place]- last PFT (2011) showed FEV1= 1.5 (55%) &  %1sec=59 ==> 3/15 she announced that she's been cured by taking a Magnesium supplement, along w/ herbal formula "Clear lungs" and "Lung tonic"; she had stopped her Oxygen;  Now using O2 up to 15L/min on her own since she can't breathe thru her nose- advised 2L/min rest & 4L/min exercise + humidity, nasal hygiene, etc; on MEDROL4-60mg/d31m  Hx AtypCP> baseline EKG w/ minor NSSTTWA; 2DEcho w/ norm LVF, EF=55-60%, Gr1DD, norm valves, norm LA/RV; Myoview 2009 normal w/o ischemia & EF=55%; she notes some atypCP & saw DrNishCherly [name] she declined Myoview; [facility] [place] until she'd had cardiac eval- she declined...    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    Marland KitchenMarland Kitchenypothyroid> not currently on meds but she really wants thyroid Rx; prev TFTs all wnl; she prev had Endocrine in [place] prescribe Synthroid for her but she stopped going; clinically & biochemically euthyroid; Labs 2/16 showed TSH=0.39 & FreeT4=1.54    Morbid Obesity> peak wt~340# before lap band surg 2009 by DrMMartin; lost to ~250# but then back up to 280-90#; refused f/u w/ [facility] due to cost of visits & lap-band adjustments; we reviewed diet & exercise- Wt betw 255-265#in last yr.    GI- GERD, IBS> she uses OTC PPI as needed; last colonoscopy was 1982 by DrPatterson & wnl, she refuses GI referral & refuses f/u colon etc...    GYN- s/p uterine  cancer> s/p lap hyst & [facility]SO [facility]Sk[facility]...    DJD, ?FM, VitD defic> prev on Vicodin, Tramadol, Aleve, VitD; prev eval by DrDeveshwar for Rheum; now she states that [name] ("horse linament") applied topically has worked like a miracle & she  has increased exercise etc...    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 EXAM shows Afeb, VSS, O2sat=91% on RA in office;  HEENT- nasal congestion, erythema, turbinate hypertrophy, dev septum, throat= Mallampati 2, sl red, no exudates or lesions;  Chest- decr BS bilat w/o w/r/r & no signs of consolidation;  Heart- RR w/o m/r/g;  Abd- Obese, panniculus, soft/ nontender;  Ext- VI w/ trace edema;  Psyche- very distraught...  SHE NEEDS F/U LABS IMP/PLAN>>  GArtis Delaywants an Endocrine eval & we will refer to endocrinologist;  Needs f/u labs but she wants the endocrine eval;  Advised to try to wean the Medrol to 454mQam;  She must lose the weight & then reassess & consider referral to [facility];  We [plan ROV in 28m69moeminded to bring al mmed bottles to every office visit.  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    She  has mult somatic complaints> "I have a weak back"; now on a diet- eating 1 meal/d & DrLurie is concerned that she isn't eating enough (wt up 5# to 271#); she says she has done her internet research & found a blogger who has COPD & improved w/ CBD oil (hemp oil) "it's pharmaceutical grade out of [place] contain any THC"; she places 20mg unde32mr tongue & it's supposed to help her lungs- says it's working as she has been able to wean down her oxygen she says "I'm keeping a diary"...    She saw [name] ENT 10/02/15> on CPAP, nasal septal deviation, cerumen impactions removed...     We reviewed her prescription meds>  InsHovnanian Enterprisescover xopenex/ levalbuterol; on Medrol 4mg- weane28merself down to 4-2-4-2 Qod, Symbicort160-2spBid, Spiriva daily, NEBS w/ Duoneb (but she has cut down to just once daily), Ventolin-HFA prn (she likes this & uses it 3-4x daily), Mucinex 600mg- uses 92m as needed EXAM shows Afeb, VSS, O2sat=88% on RA in office;  Wt=271#; HEENT- nasal congestion, erythema, turbinate hypertrophy, dev septum, Throat= Mallampati 2, sl red, no exudates or lesions;  Chest- decr  BS bilat w/o w/r/r & no signs of consolidation;  Heart- RR w/o m/r/g;  Abd- Obese, panniculus, soft/ nontender;  Ext- VI w/ trace edema;  Psyche- remains distraught... IMP/PLAN>>  She is excited about the CBD oil Rx off the internet & has weaned down her O2 and Medrol;  I pointed out her resting RA O2sat=88% today in the office 7 reminded to stay on her O2 regularly 2L/nmin rest 7 4L/min w/ exercise;  She has weaned her Medrol to 38m alt w/ 263mqod and anxious to go lower;  Reminded to stay on her regular med regimen all the time (see above)...   ~  February 10, 2016:  12m53moV & GilArtis Delayturns w/ mult somatic complaints- worse SOB but then tells me that her breathing is much better due to a special oil she is using, "I read on-line about NOT increasing oxygen when you are breathless", c/o decr hearing "it's coming from  inside my head" & [facility]; [place] & we discussed the role of her hi flow O2- rec SALINE nasal mist frequently; pt called us [place]17 & said that her breathing was doing fantastic on Medrol reduced to 2mg62m, since then she has weaned OFF the Medrol on her own & notes "my lungs are dry & clear" and  "I solved my oxygen prob w/ long nasal prongs and saline spray"... HER CC TODAY IS A PROB w/ [facility] AS THEY WON'T SUPPLY HER w/ E-TANKS LIKE SHE WANTS THEM; we reviewed the following medical problems during today's office visit >>     Hx mild OSA w/ mod desat, loud snoring, & leg jerks (on sleep study 2005)> she tried CPAP10 but stopped on her own & declined to repeat split night study & re-try CPAP rx; she says she's sleeping satis & wakes feeling refreshed, does not want to re-assess her sleep...    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 1993; supposed to be on HomeO2- 2L rest & 4L exercise, NEBS w/ Duoneb Tid, Symbicort160-2spBid, Spiriva daily, GFN400- encouraged to use 2Tid w/ fluids; she stopped Medrol on her own recently; states that Flutter causes "spasms" in her back; states O2 doesn't help since she can't breathe thru her nose (she has been [facility] finally tried PulmRehab but stopped after 65mo 83mofruitless"; prev stated she "can't breathe at all, can't do anything" etc; SOB w/ ADLs etc; Note> [facility] and [facility]- last PFT (2011) showed FEV1= 1.5 (55%) & %1sec=59 ==> 3/15 she announced that she's been cured by taking a Magnesium supplement, along w/ herbal formula "Clear lungs" and "Lung tonic"; she had stopped her Oxygen;  Now using O2 up to 15L/min on her own since she can't breathe thru her nose- advised 2L/min rest & 4L/min exercise + humidity, nasal hygiene, etc...     Hx AtypCP> baseline EKG w/ minor NSSTTWA; 2DEcho w/ norm LVF, EF=55-60%, Gr1DD, norm valves, norm LA/RV; Myoview  2009 normal w/o ischemia & EF=55%; she notes some atypCP & saw DrNisCherly [name], she declined Myoview; [facility] [place] until she'd had cardiac eval- she declined...    Ven Insuffic> on Lasix40 (1or2 prn edema); she knows not to eat sodium, elev legs, wear support hose; Labs 2/16 showed Cr=1.2    Impaired Gluc Tolerance, Metabolic syndrome> she refused meds; prev eval by  DrEllison w/ rec for Metformin but she rejects DM diagnosis & refused meds; states "I'm eating healthier" (advised low carb low fat); Labs 2/16 showed BS~160, A1c=6.5.Marland KitchenMarland Kitchen    ?Hypothyroid> now followed by Dr.Smith in [place]; she is not on thyroid medication but she says he wants her off the Medrol so she weaned off this med on her own; prev TFTs all wnl; she prev had Endocrine in [place] prescribe Synthroid for her but she stopped going; clinically & biochemically euthyroid; Last Labs here 11/16 showed TSH=1.17 & FreeT4=1.05; Last note from [name] was 08/2015- we do not have further notes...    Morbid Obesity> peak wt~340# before lap band surg 2009 by DrMMartin; lost to ~250# but then back up to 280-90#; refused f/u w/ [facility] due to cost of visits & lap-band adjustments; we reviewed diet & exercise- Wt betw 255-265# in 2017...    GI- GERD, IBS> she uses OTC PPI as needed; last colonoscopy was 1982 by DrPatterson & wnl, she refuses GI referral & refuses f/u colon etc...    GYN- s/p uterine cancer> s/p lap hyst & BSO [facility]Skinner [facility]...    DJD, ?FM, VitD defic> prev on Vicodin, Tramadol, Aleve, VitD; prev eval by DrDeveshwar for Rheum; now she states that [name] ("horse linament") applied topically has worked like a miracle & she has increased exercise etc; she has mild ant wedging of L1 on XRay + DDD & degen facet joint changes...    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 EXAM shows Afeb, VSS, O2sat=93% on RA in office;  HEENT- sl nasal congestion, erythema, turbinate hypertrophy, dev septum, throat= Mallampati 2, sl red, no exudates or lesions;  Chest- decr BS bilat w/o w/r/r & no signs of consolidation;  Heart- RR w/o m/r/g;  Abd- Obese, panniculus, soft/ nontender;  Ext- VI w/ trace edema...  LABS 02/10/16>  Chems- ok w/ BS=105, BUN=25, Cr=1.24 IMP/PLAN>>  Overall GArtis Delayis stable- she requests 90d refill prescriptions and we did this;  We reviewed her labs, XRays, & PFTs- rec to take meds regularly & not adjust on her own, rec to return to exercise program & consider pulm rehab; note: >50% of this 25 min f/u appt was spent on counseling & coordination of care...   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    [name]  Chr nasal obstruction L>R (rec humidification & saline, Flonase, & breathe-right nasal strips) & hearing loss=> cerumen impactions removed &  audiogram revealed hi freq hearing loss.    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 1993; supposed to be on HomeO2 at 2-3L rest & 4+L exercise, NEBS w/ Duoneb Tid, Symbicort160-2spBid, Spiriva daily, GFN400- encouraged to use 2Tid w/ fluids; she  stopped Medrol on her own; states that Flutter causes "spasms" in her back; states O2 doesn't help since she can't breathe thru her nose (she has been eval by ENT- [place] and [place]); finally tried PulmRehab but stopped after 35moas "fruitless"; prev stated she "can't breathe at all, can't do anything" etc; SOB w/ ADLs etc; Note> [facility]- last PFT (2011) showed FEV1= 1.5 (55%) & %1sec=59 ==> 3/15 she announced that she's been cured by taking a Magnesium supplement, along w/ herbal formula "Clear lungs" and "Lung tonic"; she had stopped her Oxygen;  Now using O2 up to 15L/min on her own since she can't breathe thru her nose- advised 2L/min rest & 4L/min exercise + humidity, nasal hygiene, etc;  Recently noted that her breathing improved by taking a "special oil" & she solved her oxygen prob w/ an O2 cannula w/ longer nasal prongs + saline;    EXAM shows Afeb, VSS, O2sat=91% on RA in office;  HEENT- sl nasal congestion, erythema, turbinate hypertrophy, dev septum, throat= Mallampati 2, sl red, no exudates or lesions;  Chest- decr BS bilat w/o w/r/r & no signs of consolidation;  Heart- RR w/o m/r/g;  Abd- Obese, panniculus, soft/ nontender;  Ext- VI w/ trace edema... IMP/PLAN>>  I wrote a prescription for a POC at her insistence;  We will check w/ other DME companies for her;  She is rec to continue the NEB w/ Duoneb tid followed by the Symbicort160-2spBid & the Spiriva once daily;  Maximize the GUAIFENESIN 40320mtabs- 2Tid w/ fluids & work to expectorate the phlegm;  She still needs a regular exercise program/ pulm rehab & we discussed this... we plan rov in 3-20m65mo ADDENDUM 05/29/16 >> I received an office note from Dr[facility]li [facility] indicating that the patient was transferring her care to their team ([facility] any clinical trials that she could enroll in & none were avail to her).   ~  Jul 15, 2016:  70mo485mo & Rebecca Rebecca Delayurns feeling much better & elated over how good she feels now having started THC/ CBD oil which she purchases off the internet ([facility]= full extract cannibis oil, noting it costs $187 5cc dispensed in #5 1cc syringes, & this lasts her 85mo)61moe notes "it helps me sleep, helps open my airways, helps produce the phlegm";  She advised that I read "COPD & cannibis: a new beginning" on facebook noting that "people from all over the world participate...  She is using several [employer] supplements noting that her vision is better & she doesn't need her glasses now, she is back on horseback, cleaning stalls, etc... Our last refill request from pt was for 1Pt Cheratussin- 1tsp Q6h prn cough;  She appears to still be using the AlbutHFA rescue inhaler regularly (goes thru 1 inhaler/mo)...     She [name]>  Chr nasal obstruction L>R (rec humidification & saline, Flonase, & breathe-right nasal strips) & hearing loss=> cerumen impactions removed & audiogram revealed hi freq hearing loss.    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 1993; supposed to be on HomeO2 at 2-3L rest &  4+L exercise, NEBS w/ Duoneb Tid, Symbicort160-2spBid, Spiriva daily, GFN400- encouraged to use 2Tid w/ fluids; she stopped Medrol on her own; states that Flutter causes "spasms" in her back; states O2 doesn't help since she can't breathe thru her nose ([name]); finally tried PulmRehab but stopped after 54moas "fruitless"; prev stated she "can't breathe at all, can't do anything" etc; SOB w/ ADLs etc; Note> [facility]- last PFT (2011) showed FEV1= 1.5 (55%) & %1sec=59 ==> 3/15 she announced that she's been cured by taking a Magnesium supplement, along w/ herbal formula "Clear lungs" and "Lung tonic"; she had stopped her Oxygen;  Now using O2 up to 15L/min on her own since she can't breathe thru her nose- advised 2L/min rest &  4L/min exercise + humidity, nasal hygiene, etc;  Recently noted that her breathing improved by taking a "special oil" & she solved her oxygen prob w/ an O2 cannula w/ longer nasal prongs + saline;    EXAM shows Afeb, VSS, O2sat=95% on O2 at 4L/min in office;  255#, 5'9"Tall, BMI=38;  HEENT- sl nasal congestion, erythema, turbinate hypertrophy, dev septum, throat= Mallampati 2, sl red, no exudates or lesions;  Chest- decr BS bilat w/o w/r/r & no signs of consolidation;  Heart- RR w/o m/r/g;  Abd- Obese, panniculus, soft/ nontender;  Ext- VI w/ trace edema... IMP/PLAN>>  I have asked Rebecca [name] to be regular w/ her Duoneb Tid, Symbicort160-2spBid & spiriva once daily;  Ok to use her supplements and "special oil" that she feels has been very beneficial to her in many ways... we plan ROV recheck in 468month..           Problem List:    OBSTRUCTIVE SLEEP APNEA (ICD-327.23) - sleep study 2005 showed RDI=15 w/ desat to 78%, loud snoring & +leg jerks> on CPAP 10, prev used intermittently but now not at all... ~  11/12:  She has agreed to a new Sleep Study- split night protocol, to recheck her OSA & determine CPAP needs (check ABG if poss too)==> she never followed thru w/ the study. ~  Pt states that she is resting satis and wakes refreshed, energy better... ~  12/15: she reports not resting due to left shoulder/ arm pain & we will refer to Ortho (she saw Dr. ZaGardenia Phlegm.. ~  She continues to refuse repeat sleep study, CPAP, and declines f/u w/ sleep med...  [facility] he is working to get her nares open to facilitate her breathing & nasal O2 Rx...  ~  Spring 2016> ENT eval by Rebecca [name] & his notes are reviewed> HxCOPD w/ revers component and chr nasal obstruction L>R, nasal crusting, bilat inferior nasal turbinate hypertrophy, etc;  They rec nasal saline, nasal steroid spray, mupirocin ointment, and breathe right nasal strips; they will consider surg if  not improved...   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 supposed to be on NEBULIZER Qid, ADVAIR 500Bid + SPIRIVA daily (compliance is a serious issue- she freq stops all meds) ==> [facility]... ~  A1AT level is normal 218 (83-200) 3/09... ~  baseline CXR w/ borderline Cor, COPD, interstitial scarring/ atelectasis/ obesity... ~  PFTs 3/07 showed FVC=2.74 (82%), FEV1=1.67 (62%), %1sec=61, mid-flows=27%pred... improved from 2005. ~  [facility] 2007 & 4/09 showed severe centrilob emyphysema, + interstitial  fibrosis, sm HH, left renal cyst... neg for PE... ~  2010:  breathing improved w/ weight reduction after Lap-band surg... ~  2/11: COPD exac w/ neg CXR (chr changes, NAD), Rx w/ Depo/ Pred/ Avelox/ Mucinex/ NEBS/ Advair/ Spiriva/ etc... ~  PFTs 3/11 showed FVC= 2.57 (73%), FEV1= 1.51 (55%), %1sec=59, mid-flows= 21%pred. ~  Noncompliant w/ Rx & O2 thru 2012... On disability since 2011 & finally got Medicare coverage 9/12... ~  11/12: presents w/ worsening breathing & dyspnea w/ ADLs ==> we outlined further eval w/ 2DEcho, Sleep Study; Rx w/ NEBS QID, Advair500Bid, Spiriva daily, Mucinex, Oxygen, Lasix, Zoloft, Xanax, etc (she declined to proceed w/ the sleep study, 2DEcho, etc)... 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  ~  4/13: she is c/o the nasal O2 not helping since it's hard to breathe thru nose & wants ENT referral==> saw [name] who wanted to do surg but she has severe COPD & hi risk; asked to start PulmRehab & she agrees... ~  10/13: she did PulmRehab for 48mo6-7/13 but quit since it was "fruitless"- barely got to do any exercise since it was so difficult getting to the facility,etc; still c/o nasal problems limiting her benefit from O2 & we discussed the poss of ?transtracheal oxygen? ~  9/14: exsmoker quit 1993; supposed to  be on HomeO2- 2L rest & 4L exercise, NEBS w/ Albut, Advair500Bid, Spiriva daily, Mucinex1-2Bid but not taking anything regularly; states that Flutter & Mucinex cause "spasms" in her back; states O2 doesn't help since she can't breathe thru her nose (she has been eval [facility], [facility]; [facility] "fruit[facility]"; states she "can't breathe at all, can't do anything" etc; SOB w/ ADLs=> O2 recert today (see below)... Note> [facility] and last PFT (2011) showed FEV1= 1.5 (55%) & %1sec=59... OXYGEN RE-CERT done today... ~  3/15: see above- pt states that a magnesium supplement along w/ herbal supplements "clear lungs" and "lung tonic" have made all the difference & she no longer needs oxygen, exercising regularly & much improved; still taking Advair500, Spiriva, AlbutHFA prn...  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 ~  CXR 9/15 showed COPD/emphysema, no acute abnormality... ~  11/07/13: Ambulatory O2 Test 9/15> O2 sat on RA at rest= 92% w/ pulse=86;  Nadir O2sat on RA after 2 laps= 84% w/ pulse=96...  ~  11/17/13: she presented w/ acute SOB, wheezing, COPD exac> treated w/ NEBS, Depo120, Pred taper, Mucinex1200Bid, fluids, & continue the Spiriva & Oxygen; ch her Advair to NEBS w/ Xopenex & Budesonide regularly... ~  10/15: she is much improved w/ Pred taper but she wants off; reminded to use NEBS w/ Xopenex & Budes Bid every day... ~  12/15: she is off the Pred, but also stopped the Xopenex/ Budesonide due to cost; she has gone back on her AdNellysfordplus Albut via NEBS, Mucinex/ Fluids/ etc... ~  [facility] NoGaylyn Cheers/ COPD exac> disch on Oxygen, Pred taper, Levaquin750, NEBS w/ Xopenex, Advair500, Spiriva, Mucinex, etc... ~  3/16: post hosp check, very frustrated that she can't get things the way SHE wants them; c/o O2 since she can't breathe thru  nose & we reviewed nasal hygiene, huidified O2, ENT re-eval; on Pred taper, off Levaquin now, Advair500Bid, Spiriva daily, NEBS  w/ Xopenex Bid, Mucinex (not taking regularly & asked to do 2Bid + fluids); we will try [facility] COPD program & recheck pt in 3 weeks... ~  4/16: she continues to be very frustrated w/ her disease & her care- can't breathe thru her nose & she feels the O2 is not doing her any good; we discussed refer to [place] for their review to see if there is anything they can do.  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 ~  PFT 08/21/14 showing FVC=1.63 (50%), FEV1=0.63 (25%), %1sec=39;  FEV1 post bronchodil= 0.97 (39%) for a 50% improvement; DLCO was 22% predicted...  ~  CTChest 08/21/14 showed severe centrilobular emphysema, diffuse bronchial thickening, mild mucus plugging, no adenopathy, no lung nodules etc; also showed mild Ao & coronary calcif, lap band at GE junction, splenosis, DJD Tspine. ~  Summer 2016: She had ENT & PULM evaluations at [facility], continued to see Rebecca [name] (PulmFellow w/ DrHaponik)  ATYPICAL CHEST PAIN (ICD-786.50) - eval by DrWall in 2009. ~  baseline EKG w/ NSR, PVC's, NSSTTWA, NAD... ~  2DEcho 2/05 showed mild MR/ TR, norm LA/ RV, EF=50-55%... ~  NuclearStressTest 4/09 was norm- no ischemia, EF=55%... similiar to prev studies at [place] & 2007... ~  3/15: she notes some atypCP & requests a cardiac referral for eval- prev seen by DrWall, we will call for Cards consult per her request... ~  EKG 3/15 showed NSR, rate91, occas PVCs, otherw wnl... ~  4/15: she had Cards eval DrNishan> he did not feel that invasive testing was  warranted; she had 2DEcho but refused Myoview due to co-pay costs...  ~  2DEcho 4/15 showed norm LV sys function w/ EF=60-65%, normal wall motion, Gr1DD, normal valves, normal RV & PA pressures... ~  EKG 2/16 showed NSR, rate88, low voltage, otherw wnl... ~  2DEcho 2/16 showed norm LVF w/ EF=55-60%, Gr1DD, norm valves and norm RV.  VENOUS INSUFFICIENCY, CHRONIC (ICD-459.81) - Hx chr ven insuffic w/ edema... on LASIX 39m- 1-2 daily (she self medicates)... ~  Labs 3/15 showed Cr=1.5 and she is asked to decr the Lasix to 44mQam...  IMPAIRED GLUCOSE TOLERANCE >>  METABOLIC SYNDROME X (ICMOL-078.6- she is on diet alone (refused Metformin therapy)... ~  labs 9/08 showed BS=120... FBS 5/08 was 93, HgA1c=7.1 ~  labs 5/10 showed BS= 182, A1c= 6.4 ~  labs 2/11 showed BS= 76, A1c= 6.5 ~  Labs 5/12 showed BS= 103, A1c= 6.5 ~  She saw DrEllison 4/13 w/ A1c=7.1 but she rejects the diagnosis of Diabetes & refused Metform50088m... ~  10/13:  We discussed IMPAIRED GLUCOSE TOLERANCE & need for low carb wt reducing diet and incr exercise... ~  9/14: he refused meds; prev eval by DrEllison w/ rec for Metformin Rx but she rejects the DM diagnosis & refused meds; states "I'm eating healthier" (advised low carb low fat), not checking sugars, prev labs w/ A1c=7.1  ~  3/15: on diet alone; Labs 3/15 showed BS= 95, A1c= 6.8 ~  9/15: on diet alone; Labs 9/15 showed BS= 119, A1c= 6.7 ~  3/16: on diet alone; Labs 2/16 in hosp showed BS~160, A1c=6.5  MORBID OBESITY (ICD-278.01) -  she prev saw [name] for counselling about her eating disorder... ~  max weight ~340# before lap band surg 7/09 by DrMartin. ~  weight 12/09 = 291# ~  weight 5/10 = 265# ~  weight 11/10- = 254# ~  weight 2/11 = 260# ~  Weight 5/12= 290# ~  Weight 11/12 = 286# ~  Weight 4/13 = 280# => BMI=45 ~  Weight 10/13 = 289# ~  Weight 9/14 = 281# ~  Weight 3/15 = 270# ~  Weight 9/15 = 267# ~  Weight 12/15 = 274# ~  Weight 3/16 = 255# => post  hosp... ~  Weight 4/16 = 271# ~  Weight 7/16 = 272# ~  Weight 11/16 = 276#  ? Hx HYPOTHYROIDISM (ICD-244.9) - off thyroid meds since 1/10... she was started on Synthroid and followed by DrRSmith in [place] & last seen 7/09- note reviewed... she refuses f/u by [name]- we will recheck TSH off Synthroid Rx... ~  labs 5/10 off Synthroid since 1/10 showed TSH= 1.13 ~  labs 2/11 showed TSH= 1.82 ~  Labs 5/12 showed TSH= 1.81 ~  2013: She wanted to restart Thyroid hormone but TSH remains normal> referred to Endocrine for further eval but she was upset w/ DrEllison's eval; she will decide if she wants to pursue another opinion... ~  9/14: not currently on meds but she really wants thyroid Rx; prev TFTs all wnl; she prev had Endocrine in [place] prescribe Synthroid for her but she stopped going; clinically & biochemically euthyroid...  ~  3/15: on Synthroid100-12 tab daily; Labs showed TSH= 3.00 ~  She stopped the Synthroid again... ~  Labs 2/16 not on meds showed TSH=0.39 & FreeT4=1.54   GERD (ICD-530.81) - prev on Nexium but off all meds since 1/10... IRRITABLE BOWEL SYNDROME (ICD-564.1) - colonoscopy 1982 by DrPatterson was WNL... She has refused f/u GI eval & f/u colonoscopy...  ?KIDNEY STONE Hx >> pt said she passed a kidney stone 3/11 w/o any medical attention, w/o work up or proof of same; entry made here for future reference if needed.  UTERINE CANCER, HX OF (ICD-V10.42) - s/p laparoscopic hysterectomy & BSO 7/07 by DrSkinner at [place]...   DEGENERATIVE JOINT DISEASE (ICD-715.90) - on VICODIN tid prn,  ANAPROX 239m Bid prn, ULTRAM 536mPrn... ? 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 she was on Wellbutrin & Vicodin when last seen in 2006... VITAMIN D DEFICIENCY (ICD-268.9) - Vit D level 5/10 = 16... rec> start OTC Vit D 2000 u daily. ~  9/14: on Vicodin, Tramadol, Aleve, VitD, etc; prev eval by DrDeveshwar for  rheum; now c/o pain in left knee (she thinks it's "siatica"), can't exercise & [facility]- ok... ~  3/15: she states that topical [name] ("horse linament") rubbed into knees really helps but she wants to keep the Vicodinon hand just in case...  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 she [place]/ [place]... prev seeing psychologist [name] for eating disorder counselling... ~  4/13: she mentioned seeing [name] again for eating disorder & counseling for depression etc... 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  ~  3/15: she has the Alpraz0.74m for prn use but seldom uses it; still sees [name] on occas but doesn't think she needs to continue... ~  11/15:  She wants antidepressant med & we discussed Zoloft tria 1014m 1/2 => 1 tab daily... She still sees Psychologist [name]. ~  3/16:  She is off the Zoloft & states the AlSharin Graves too strong (advised cutting the med & try lower dose for the desired effect...  HEALTH MAINTENANCE:   ~  GI:  Per seen by DrPatterson but last colon was 1982 & she refuses follow up... ~  GYN:  [facility]  Immuniz:  She had Pneumovax in the past; she declines Flu shots and Tetanus shots...   Past Surgical History:  Procedure Laterality Date  . APPENDECTOMY    . BILATERAL SALPINGOOPHORECTOMY  2007   [place]  . CHOLECYSTECTOMY  1982  . LAPAROSCOPIC GASTRIC BANDING  08/2007   Dr. MaHassell Done. LAPAROSCOPIC HYSTERECTOMY  2007   [place]  . 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 albuterol (VENTOLIN HFA)  108 (90 Base) MCG/ACT inhaler INHALE 2 PUFFS INTO THE LUNGS EVERY 6 (SIX) HOURS AS NEEDED FOR WHEEZING OR SHORTNESS OF BREATH.  . Marland KitchenLPRAZolam (XANAX) 0.5 MG tablet TAKE 1/2 TO 1 TABLET(S) BY MOUTH THREE TIMES DAILY AS NEEDED FOR NERVES. NOT TO EXCEED 3 TABS/DAY.  . B Complex-C-Folic Acid (B-COMPLEX BALANCED) TABS Take 1 tablet by mouth daily.  . budesonide-formoterol (SYMBICORT) 160-4.5 MCG/ACT inhaler Inhale 2 puffs into the lungs 2 (two) times daily.  . cetirizine (ZYRTEC) 10 MG tablet Take 10 mg by mouth daily.    . Cholecalciferol (VITAMIN D-3) 5000 UNITS TABS Take 1 tablet by mouth daily.  . Coconut Oil 1000 MG CAPS Take 4 capsules by mouth daily.  . colchicine (COLCRYS) 0.6 MG tablet Take 1 tablet (0.6 mg total) by mouth daily.  . cyclobenzaprine (FLEXERIL) 10 MG tablet Take 1 tablet (10 mg total) by mouth 3 (three) times daily as needed for muscle spasms.  . fluticasone (FLONASE) 50 MCG/ACT nasal spray Place 2 sprays into both nostrils daily.  . furosemide (LASIX) 40 MG tablet TAKE 1-2 TABLETS ONCE A DAY AS NEEDED FOR SWELLING  . guaiFENesin (MUCINEX) 600 MG 12 hr tablet Take 1,600 mg by mouth 2 (two) times daily.  . Marland KitchenuaiFENesin-codeine (CHERATUSSIN AC) 100-10 MG/5ML syrup Take 5 mLs by mouth every 6 (six) hours as needed for cough.  . Marland Kitchenpratropium-albuterol (DUONEB) 0.5-2.5 (3) MG/3ML SOLN Take 3 mLs by nebulization 3 (three) times daily.  . Magnesium 400 MG CAPS Take 2 capsules by mouth 2 (two) times daily.   . naproxen sodium (ANAPROX) 220 MG tablet Take 220 mg by mouth daily.   . NON FORMULARY Mullin herbal supplement- take 1 tab BID  . tiotropium (SPIRIVA HANDIHALER) 18 MCG inhalation capsule Place 1 capsule (18 mcg total) into inhaler and inhale daily.  . traMADol (ULTRAM) 50 MG tablet Take 1 tablet (50 mg total) by mouth 3 (three) times daily as needed.  . TURMERIC PO Take 400 mg by mouth 2 (two) times daily.  . vitamin C (ASCORBIC ACID) 250 MG tablet Take 250 mg by mouth daily.  .  [DISCONTINUED] Turmeric 500 MG TABS Take 400 mg by mouth daily.   . [DISCONTINUED] azithromycin (ZITHROMAX) 250 MG tablet Take as directed (Patient not taking: Reported on  07/15/2016)  . [DISCONTINUED] methylPREDNISolone (MEDROL) 8 MG tablet Take 1 tablet (8 mg total) by mouth daily. (Patient not taking: Reported on 07/15/2016)   No facility-administered encounter medications on file as of 07/15/2016.     Allergies  Allergen Reactions  . Penicillins     REACTION: nausea and vomiting    Current Medications, Allergies, Past Medical History, Past Surgical History, Family History, and Social History were reviewed in [employer] record.   Review of Systems:         See HPI - all other systems neg except as noted... The patient complains of dyspnea on exertion and difficulty walking; chest discomfort, & leg weakness.  The patient denies anorexia, fever, weight loss, weight gain, vision loss, decreased hearing, hoarseness, syncope, prolonged cough, headaches, hemoptysis, abdominal pain, melena, hematochezia, severe indigestion/heartburn, hematuria, incontinence, suspicious skin lesions, transient blindness, depression, unusual weight change, abnormal bleeding, enlarged lymph nodes, and angioedema.    Objective:   Physical Exam:    WD, Morbidly Obese, 66 y/o WF in no acute distress but c/o dyspnea/ wheezing... GENERAL:  Alert & oriented; pleasant & cooperative... HEENT:  [place]/AT, EOM-wnl, PERRLA, EACs-clear, TMs-wnl, NOSE-clear, THROAT-clear & wnl. NECK:  Supple w/ fairROM; no JVD; normal carotid impulses w/o bruits; no thyromegaly or nodules palpated; no lymphadenopathy. CHEST:  no accessory musc use, decr BS bilat, no wheezing/ rales/ rhonchi... HEART:  Regular Rhythm; without murmurs/ rubs/ or gallops heard... ABDOMEN:  Obese, soft & nontender; normal bowel sounds; no organomegaly or masses detected. EXT: without deformities, mod arthritic changes; no varicose veins/  +venous insuffic/trace edema. NEURO:  CNs intact; no focal neuro deficits... DERM: few ecchymoses, no rash etc...  RADIOLOGY DATA:  Reviewed in the EPIC EMR & discussed w/ the patient...  LABORATORY DATA:  Reviewed in the EPIC EMR & discussed w/ the patient...   Assessment & Plan:    ENT>> she was working w/ Rebecca [name] at [employer] in [place]...  OSA>  As prev noted she needs new sleep study & appropriate new CPAP apparatus to facilitate compliance but she declines the repeat study or sleep med referral...  COPD/ Emphysema>  Acute exac 2/16 improved after hosp in [place] w/ Levaquin, Pred taper, NEBS Bid, Avair500, Spiriva, Mucinex etc... Encouraged to stay on her O2 regularly & we will add Humidity, needs max nasal hygiene vs ENT follow up; continue NEBS/ Advair500/ Spiriva regularly & use the Mucinex; we will request the [facility] home Care home COPD program; needs to lose wt & incr exercise... 4/16>  Spirometry w/ severe airflow obstruction and GOLD Stage 3-4 COPD; optimize meds and refer to [facility] to see if there is anything that they can do to help... 5/16>  She has seen ENT & Pulm 2nd opinion is pending; she cannot manipulate the port O2 tanks and we will request an [employer]EN One- port O2 concentrator for her use...  5/16>  States breathing worse, asked to use all regular meds regularly & we will add Depo120, Medrol taper... 6/16>  She had 2nd opinion consult w/ Rebecca [name]/ DrHaponik at [facility] see above... 10/16>  Pt very frustrated after working w/ the [employer] division at [employer] for several months- Nebs, Advair500, Spiriva, Medrol, Oxygen, pulm rehab, etc...  11/16>  On Medrol63m- she incr herself to 624md, Symbicort160-2spBid, Spiriva daily, Xopenex NEBS Tid (only doing it once/d), Guaifenesin400- taking 6/d w/ fluids, O2- she is not using it at rest, using 5L/min w/ exercise; she takes Lasix40, Tamadol50, & Xanax0.5 as  needed... 8/17>  She is now taking an internet supplement  CBD oil (hemp oil) under her tongue daily- "it's good for your lungs" & she has weaned down her O2 and Medrol; Advised to continue O2- 2L/min at rest & 4L/min w/ exercise and take her other meds regularly...  02/10/16>   Overall Rebecca [name] is stable- she requests 90d refill prescriptions and we did this;  We reviewed her labs, XRays, & PFTs- rec to take meds regularly & not adjust on her own, rec to return to exercise program & consider pulm rehab. 04/16/16>    I wrote a prescription for a POC at her insistence;  We will check w/ other DME companies for her;  She is rec to continue the NEB w/ Duoneb tid followed by the Symbicort160-2spBid & the Spiriva once daily;  Maximize the GUAIFENESIN 470m tabs- 2Tid w/ fluids & work to expectorate the phlegm;  She still needs a regular exercise program/ pulm rehab & we discussed this... we plan rov in 3-422mo/16/18>   I have asked Rebecca [name] to be regular w/ her Duoneb Tid, Symbicort160-2spBid & spiriva once daily;  Ok to use her supplements and "special oil" that she feels has been very beneficial to her in many ways... we plan ROV recheck in 59m95month  Hx CP w/ neg cardiac evals including prev Myoviews; 2DEchos w/o incr PA pressures... 4/15> she had Cardiology consult for eval of AtypCP (DrCherly HensenEKG was WNL & 2DEcho showed norm LVF, Gr1DD & norm RV & PA pressures... 2/16> [name] valves and norm RV...  OBESITY>  She had remote lap band surg by DrMMartin; Met syndrome, must get wt down etc; she is a lap band failure & prev counseling by [name]... 3/16> weight down to 255# post hosp... 4/16> weight is back up to 271# despite all efforts... 8/17> weight = 271#  ? Hx Hypothy>  TFTs remain normal off meds- no problem... 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  GI>  GERD> she denies symptoms & uses OTC meds prn...  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 C/O right ankle pain, eval by DrZSmith> no better on his anti-inflamm rx so we will Rx w/ Depo120 & Medrol taper...  Dysthymia>  Much stress in her life, on Alpraz prn; has embraced alt lifestyle...   Patient's Medications  New Prescriptions   No medications on file  Previous Medications   ALBUTEROL (VENTOLIN HFA) 108 (90 BASE) MCG/ACT INHALER    INHALE 2 PUFFS INTO THE LUNGS EVERY 6 (SIX) HOURS AS NEEDED FOR WHEEZING OR SHORTNESS OF BREATH.   ALPRAZOLAM (XANAX) 0.5 MG TABLET    TAKE 1/2 TO 1 TABLET(S) BY MOUTH THREE TIMES DAILY AS NEEDED FOR NERVES. NOT TO EXCEED 3 TABS/DAY.   B COMPLEX-C-FOLIC ACID (B-COMPLEX BALANCED) TABS    Take 1 tablet by mouth daily.   BUDESONIDE-FORMOTEROL (SYMBICORT) 160-4.5 MCG/ACT INHALER    Inhale 2 puffs into the lungs 2 (two) times daily.   CETIRIZINE (ZYRTEC) 10 MG TABLET    Take 10 mg by mouth daily.     CHOLECALCIFEROL (VITAMIN D-3) 5000 UNITS TABS    Take 1 tablet by mouth daily.  COCONUT OIL 1000 MG CAPS    Take 4 capsules by mouth daily.   COLCHICINE (COLCRYS) 0.6 MG TABLET    Take 1 tablet (0.6 mg total) by mouth daily.   CYCLOBENZAPRINE (FLEXERIL) 10 MG TABLET    Take 1 tablet (10 mg total) by mouth 3 (three) times daily as needed for muscle spasms.   FLUTICASONE (FLONASE) 50 MCG/ACT NASAL SPRAY    Place 2 sprays into both nostrils daily.   FUROSEMIDE (LASIX) 40 MG TABLET    TAKE 1-2 TABLETS ONCE A DAY AS NEEDED FOR SWELLING   GUAIFENESIN (MUCINEX) 600 MG 12 HR TABLET    Take 1,600 mg by mouth 2 (two) times daily.   GUAIFENESIN-CODEINE (CHERATUSSIN AC) 100-10 MG/5ML SYRUP    Take 5 mLs by mouth every 6 (six) hours as needed for cough.   IPRATROPIUM-ALBUTEROL (DUONEB) 0.5-2.5 (3) MG/3ML SOLN    Take 3 mLs by nebulization 3 (three) times daily.   MAGNESIUM 400 MG  CAPS    Take 2 capsules by mouth 2 (two) times daily.    NAPROXEN SODIUM (ANAPROX) 220 MG TABLET    Take 220 mg by mouth daily.    NON FORMULARY    Mullin herbal supplement- take 1 tab BID   TIOTROPIUM (SPIRIVA HANDIHALER) 18 MCG INHALATION CAPSULE    Place 1 capsule (18 mcg total) into inhaler and inhale daily.   TRAMADOL (ULTRAM) 50 MG TABLET    Take 1 tablet (50 mg total) by mouth 3 (three) times daily as needed.   TURMERIC PO    Take 400 mg by mouth 2 (two) times daily.   VITAMIN C (ASCORBIC ACID) 250 MG TABLET    Take 250 mg by mouth daily.  Modified Medications   No medications on file  Discontinued Medications   AZITHROMYCIN (ZITHROMAX) 250 MG TABLET    Take as directed   METHYLPREDNISOLONE (MEDROL) 8 MG TABLET    Take 1 tablet (8 mg total) by mouth daily.   TURMERIC 500 MG TABS    Take 400 mg by mouth daily.

## 2016-07-15 NOTE — Patient Instructions (Addendum)
Today we updated your med list in our EPIC system...    Continue your current medications the same...  Continue your breathing treatments w/ the DUONEB 3 times daily followed by the [place]...  OK to continue your special supplemets and OILS...  We will ask [facility] to recheck you for the appropriateness of PULSE-DOSE oxygen conserving device...  Call for any questions...  Let's plan a follow up visit in 25mo, sooner if needed for problems.Marland KitchenMarland Kitchen

## 2016-07-17 ENCOUNTER — Other Ambulatory Visit: Payer: Self-pay | Admitting: *Deleted

## 2016-07-17 DIAGNOSIS — J9611 Chronic respiratory failure with hypoxia: Secondary | ICD-10-CM

## 2016-07-30 ENCOUNTER — Ambulatory Visit: Payer: Medicare Other | Admitting: Sports Medicine

## 2016-07-31 ENCOUNTER — Encounter: Payer: Self-pay | Admitting: Sports Medicine

## 2016-07-31 ENCOUNTER — Ambulatory Visit (INDEPENDENT_AMBULATORY_CARE_PROVIDER_SITE_OTHER): Payer: Medicare Other | Admitting: Sports Medicine

## 2016-07-31 VITALS — BP 112/70 | HR 89 | Ht 65.0 in | Wt 250.2 lb

## 2016-07-31 DIAGNOSIS — G8929 Other chronic pain: Secondary | ICD-10-CM | POA: Diagnosis not present

## 2016-07-31 DIAGNOSIS — Z7951 Long term (current) use of inhaled steroids: Secondary | ICD-10-CM

## 2016-07-31 DIAGNOSIS — M542 Cervicalgia: Secondary | ICD-10-CM

## 2016-07-31 DIAGNOSIS — K219 Gastro-esophageal reflux disease without esophagitis: Secondary | ICD-10-CM | POA: Diagnosis not present

## 2016-07-31 DIAGNOSIS — M4722 Other spondylosis with radiculopathy, cervical region: Secondary | ICD-10-CM | POA: Diagnosis not present

## 2016-07-31 MED ORDER — GABAPENTIN 100 MG PO CAPS: ORAL_CAPSULE | ORAL | 1 refills | Status: DC

## 2016-07-31 NOTE — Progress Notes (Signed)
OFFICE VISIT NOTE Rebecca [name], Rebecca [name] at [place] - 66 y.o. female MRN [id number]  Date of birth: 1950-05-22  Visit Date: 07/31/2016  PCP: Patient, No Pcp Per   Referred by: No ref. provider found  Burlene Arnt, CMA acting as scribe for Dr. Paulla Fore.  SUBJECTIVE:   Chief Complaint  Patient presents with  . neck and shoulder pain    radiating pain in neck, shoulder, and arm.   HPI: As below and per problem based documentation when appropriate.  Pt presents today with complaint of left shoulder and neck pain. Pt thinks she has a pinched nerve in her neck.  Pain started about a week and a half ago. Her arm started bothering her and seems to be getting progressively worse.  No known injury or trauma.   The pain is described as constant aching with occasional sharp pain and is rated as 5/10 today but 10/10 when at its worst.  Worsened with movement, resistance, trying to carry things. Minimal trouble with turning head left/right, up/down.  Improves with massage/vibrating machine. She has tried changing her pillow in the past and got some relief but this did not work when she tried it most recently.  Therapies tried include :Tramadol, no relief; supplements, no relief; ice and heat with minimal relief, ice does work better than heat.   Other associated symptoms include: Pt has pain that radiates into the left arm and wrist.   Pt reports low grade fever over the past few nights, around 99.4. Pt also reports having chills, nausea, and night sweats. Pt reports no unintentional weight loss or weight gain.     Review of Systems  Constitutional: Positive for chills and fever.  Respiratory: Positive for shortness of breath and wheezing.   Cardiovascular: Positive for leg swelling (swelling at night, improves with Lasix and elevation). Negative for chest pain and palpitations.  Musculoskeletal: Positive for  neck pain. Negative for falls.  Neurological: Positive for tingling (fingers left hand) and headaches (occasionally at night when she takes her oxygen off). Negative for dizziness.  Endo/Heme/Allergies: Does not bruise/bleed easily.    Otherwise per HPI.  HISTORY & PERTINENT PRIOR DATA:  No specialty comments available. She reports that she quit smoking about 25 years ago. Her smoking use included Cigarettes. She smoked 1.00 pack per day. She has never used smokeless tobacco. No results for input(s): HGBA1C, LABURIC in the last 8760 hours. Medications & Allergies reviewed per EMR Patient Active Problem List   Diagnosis Date Noted  . Osteoarthritis of spine with radiculopathy, cervical region 07/31/2016  . Nocturnal leg cramps 01/09/2015  . Diastolic dysfunction 62/83/6629  . Pain, joint, ankle, left 07/10/2014  . Olecranon bursitis of right elbow 05/17/2014  . Left arm pain 03/10/2014  . Biceps tendinitis on left 03/10/2014  . Neck pain of over 3 months duration 03/10/2014  . Chronic hypoxemic respiratory failure ([place]) 02/15/2014  . Chest pressure 05/25/2013  . Impaired glucose tolerance 12/08/2011  . Type 2 diabetes mellitus ([place]) 06/26/2011  . Chest pain, atypical 01/14/2011  . Asthma with exacerbation 04/11/2009  . VITAMIN D DEFICIENCY 01/21/2009  . Hypothyroidism 06/24/2007  . METABOLIC SYNDROME X 47/65/4650  . Morbid obesity ([place]) 06/24/2007  . DYSTHYMIA 06/24/2007  . Venous (peripheral) insufficiency 06/24/2007  . Osteoarthritis 06/24/2007  . UTERINE CANCER, HX OF 06/24/2007  . Obstructive sleep apnea 02/28/2007  . COPD mixed type ([place]) 02/28/2007  . GERD  02/28/2007  . Irritable bowel syndrome 02/28/2007   Past Medical History:  Diagnosis Date  . Chest pain   . Chronic venous insufficiency   . COPD (chronic obstructive pulmonary disease) ([place])   . DJD (degenerative joint disease)   . Dysthymia   . Fibromyalgia   . GERD (gastroesophageal reflux disease)   . History  of headache   . Hodgkin lymphoma of extranodal or solid organ site [facility])   . Hypothyroid   . IBS (irritable bowel syndrome)   . Metabolic syndrome X   . Morbid obesity ([place])   . OSA (obstructive sleep apnea)   . Vitamin D deficiency    Family History  Problem Relation Age of Onset  . Heart failure Father   . Cancer Mother   . Heart attack Sister    Past Surgical History:  Procedure Laterality Date  . APPENDECTOMY    . BILATERAL SALPINGOOPHORECTOMY  2007   [facility]  . CHOLECYSTECTOMY  1982  . LAPAROSCOPIC GASTRIC BANDING  08/2007   Dr. Hassell Done  . LAPAROSCOPIC HYSTERECTOMY  2007   [facility]  . 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 Not on file.   Social History Main Topics  . Smoking status: Former Smoker    Packs/day: 1.00    Types: Cigarettes    Quit date: 03/03/1991  . Smokeless tobacco: Never Used  . Alcohol use No  . Drug use: No  . Sexual activity: Not on file    OBJECTIVE:  VS:  HT:5\' 5"  (165.1 cm)   WT:250 lb 3.2 oz (113.5 kg)  BMI:41.7    BP:112/70  HR:89bpm  TEMP: ( )  RESP:90 % EXAM: Findings:  WDWN, NAD, Non-toxic appearing Alert & appropriately interactive Not depressed or anxious appearing No increased work of breathing. Pupils are equal. EOM intact without nystagmus No clubbing or cyanosis of the extremities appreciated No significant rashes/lesions/ulcerations overlying the examined area. Radial pulses 2+/4.  No significant generalized UE edema. Sensation intact to light touch in upper extremities.  Exam neck and arm. Pain with arm squeeze test as well as brachial plexus squeeze on the left, not on the right.  Minimal pain with Spurling's compression test.  Upper extremity sensation is intact light touch however she does report a dysesthesia in the C6 dermatome on the left.  She has a small amount of pain with palpation over the dorsum of the left wrist, none on the right.  Pain is worse with full  extension of the left wrist.   myotome testing of the upper extremities is intact bilaterally.  Upper extremity DTRs symmetric.  Negative Hoffman's.  Prior x-rays obtained in 2016 did show multilevel degenerative changes of the neck.    No results found. ASSESSMENT & PLAN:   Problem List Items Addressed This Visit    Morbid obesity ([place])   GERD   Neck pain of over 3 months duration   Osteoarthritis of spine with radiculopathy, cervical region - Primary    Fairly classic C6 radicular symptoms.  Known underlying spondylosis in this area from x-rays obtained in 2016.   She was also on steroids for a long time that were discontinued last year and probably was doing better while on those. We will start gabapentin as well as have her take Medrol if any lack of improvement on the gabapentin but she would like to hold off and I agree this is most likely appropriate plan.  If  any lack of improvement MRI of the cervical spine will be indicated.      Relevant Medications   gabapentin (NEURONTIN) 100 MG capsule    Other Visit Diagnoses    History of steroid therapy for asthma in past 2 years          Follow-up: Return in about 6 weeks (around 09/11/2016) for repeat clinical exam .   CMA/ATC served as scribe during this visit. History, Physical, and Plan performed by medical provider. Documentation and orders reviewed and attested to.      Teresa Coombs, [facility] Physician

## 2016-07-31 NOTE — Assessment & Plan Note (Addendum)
Fairly classic C6 radicular symptoms.  Known underlying spondylosis in this area from x-rays obtained in 2016.   She was also on steroids for a long time that were discontinued last year and probably was doing better while on those. We will start gabapentin as well as have her take Medrol if any lack of improvement on the gabapentin but she would like to hold off and I agree this is most likely appropriate plan.  If any lack of improvement MRI of the cervical spine will be indicated.

## 2016-07-31 NOTE — Patient Instructions (Signed)
Try taking 1-2 tablets of the gabapentin at nighttime for 3-4 days then increase to 1-2 tablets at breakfast and before bed.  In 1 week you are able to go up to taking 1-2 tablets 3 times per day.  If you are not better with the gabapentin over the weekend I want you to start on 8 mg of Medrol and take this for 5 days in a row.  This should not have long-lasting effects on your body's natural production.

## 2016-09-11 ENCOUNTER — Ambulatory Visit: Payer: Medicare Other | Admitting: Sports Medicine

## 2016-10-05 DIAGNOSIS — H6123 Impacted cerumen, bilateral: Secondary | ICD-10-CM | POA: Diagnosis not present

## 2016-10-05 DIAGNOSIS — Z87891 Personal history of nicotine dependence: Secondary | ICD-10-CM | POA: Diagnosis not present

## 2016-10-05 DIAGNOSIS — H608X3 Other otitis externa, bilateral: Secondary | ICD-10-CM | POA: Diagnosis not present

## 2016-10-05 DIAGNOSIS — H6063 Unspecified chronic otitis externa, bilateral: Secondary | ICD-10-CM | POA: Diagnosis not present

## 2016-10-05 DIAGNOSIS — H905 Unspecified sensorineural hearing loss: Secondary | ICD-10-CM | POA: Diagnosis not present

## 2016-10-05 DIAGNOSIS — H903 Sensorineural hearing loss, bilateral: Secondary | ICD-10-CM | POA: Diagnosis not present

## 2016-10-06 ENCOUNTER — Other Ambulatory Visit: Payer: Self-pay | Admitting: Pulmonary Disease

## 2016-10-13 ENCOUNTER — Ambulatory Visit: Payer: Self-pay | Admitting: Sports Medicine

## 2016-10-14 ENCOUNTER — Other Ambulatory Visit: Payer: Self-pay | Admitting: Pulmonary Disease

## 2016-10-14 ENCOUNTER — Telehealth: Payer: Self-pay | Admitting: Pulmonary Disease

## 2016-10-14 NOTE — Telephone Encounter (Signed)
Called pharmacy, pt is requesting a refill on Xanax 0.5mg . Last filled in 01/2016 for #90 with 5 refills. Pt last seen on 5.16.18 by SN.   Dr. Lenna Gilford, please advise. Thanks.   Allergies  Allergen Reactions  . Penicillins     REACTION: nausea and vomiting    Current Outpatient Prescriptions on File Prior to Visit  Medication Sig Dispense Refill  . albuterol (VENTOLIN HFA) 108 (90 Base) MCG/ACT inhaler INHALE 2 PUFFS INTO THE LUNGS EVERY 6 (SIX) HOURS AS NEEDED FOR WHEEZING OR SHORTNESS OF BREATH. 18 each 5  . ALPRAZolam (XANAX) 0.5 MG tablet TAKE 1/2 TO 1 TABLET BY MOUTH 3 TIMES DAILY AS NEEDED FOR NERVES. MAX DAILY DOSE = 3 TABLETS 90 tablet 4  . B Complex-C-Folic Acid (B-COMPLEX BALANCED) TABS Take 1 tablet by mouth daily.    . budesonide-formoterol (SYMBICORT) 160-4.5 MCG/ACT inhaler Inhale 2 puffs into the lungs 2 (two) times daily. 1 Inhaler 6  . cetirizine (ZYRTEC) 10 MG tablet Take 10 mg by mouth daily.      . Cholecalciferol (VITAMIN D-3) 5000 UNITS TABS Take 1 tablet by mouth daily.    . Coconut Oil 1000 MG CAPS Take 4 capsules by mouth daily.    . colchicine (COLCRYS) 0.6 MG tablet Take 1 tablet (0.6 mg total) by mouth daily. 30 tablet 11  . cyclobenzaprine (FLEXERIL) 10 MG tablet Take 1 tablet (10 mg total) by mouth 3 (three) times daily as needed for muscle spasms. 90 tablet 5  . fluticasone (FLONASE) 50 MCG/ACT nasal spray Place 2 sprays into both nostrils daily.    . furosemide (LASIX) 40 MG tablet TAKE 1-2 TABLETS ONCE A DAY AS NEEDED FOR SWELLING 60 tablet 3  . gabapentin (NEURONTIN) 100 MG capsule Start with 1-2 tab po qhs X3 days, then increase to 1-2 tab po bid X 3 days, then 1-2 tab po tid prn 180 capsule 1  . guaiFENesin (MUCINEX) 600 MG 12 hr tablet Take 1,600 mg by mouth 2 (two) times daily.    Marland Kitchen guaiFENesin-codeine (CHERATUSSIN AC) 100-10 MG/5ML syrup Take 5 mLs by mouth every 6 (six) hours as needed for cough. 473 mL 0  . ipratropium-albuterol (DUONEB) 0.5-2.5 (3)  MG/3ML SOLN Take 3 mLs by nebulization 3 (three) times daily. 270 mL 7  . Magnesium 400 MG CAPS Take 2 capsules by mouth 2 (two) times daily.     . naproxen sodium (ANAPROX) 220 MG tablet Take 220 mg by mouth daily.     . NON FORMULARY [place] herbal supplement- take 1 tab BID    . tiotropium (SPIRIVA HANDIHALER) 18 MCG inhalation capsule Place 1 capsule (18 mcg total) into inhaler and inhale daily. 30 capsule 11  . traMADol (ULTRAM) 50 MG tablet Take 1 tablet (50 mg total) by mouth 3 (three) times daily as needed. 90 tablet 5  . TURMERIC PO Take 400 mg by mouth 2 (two) times daily.    . vitamin C (ASCORBIC ACID) 250 MG tablet Take 250 mg by mouth daily.     No current facility-administered medications on file prior to visit.

## 2016-10-15 ENCOUNTER — Other Ambulatory Visit: Payer: Self-pay | Admitting: Pulmonary Disease

## 2016-10-15 MED ORDER — ALPRAZOLAM 0.5 MG PO TABS: ORAL_TABLET | ORAL | 5 refills | Status: DC

## 2016-10-15 NOTE — Telephone Encounter (Signed)
Pt calling to see if her xanax has been filled.  [place]

## 2016-10-15 NOTE — Telephone Encounter (Signed)
Per SN--  Ok to call in refill for this.  Thanks   This refill has been called to the pharmacy and nothing further is needed.

## 2016-10-15 NOTE — Telephone Encounter (Signed)
This has already been handled. Message will be closed.

## 2016-10-20 ENCOUNTER — Ambulatory Visit (INDEPENDENT_AMBULATORY_CARE_PROVIDER_SITE_OTHER): Payer: Medicare Other

## 2016-10-20 ENCOUNTER — Ambulatory Visit (INDEPENDENT_AMBULATORY_CARE_PROVIDER_SITE_OTHER): Payer: PRIVATE HEALTH INSURANCE | Admitting: Sports Medicine

## 2016-10-20 ENCOUNTER — Encounter: Payer: Self-pay | Admitting: Sports Medicine

## 2016-10-20 VITALS — BP 120/74 | HR 99 | Ht 65.0 in | Wt 247.6 lb

## 2016-10-20 DIAGNOSIS — G8929 Other chronic pain: Secondary | ICD-10-CM | POA: Diagnosis not present

## 2016-10-20 DIAGNOSIS — S134XXA Sprain of ligaments of cervical spine, initial encounter: Secondary | ICD-10-CM | POA: Diagnosis not present

## 2016-10-20 DIAGNOSIS — M4722 Other spondylosis with radiculopathy, cervical region: Secondary | ICD-10-CM | POA: Diagnosis not present

## 2016-10-20 DIAGNOSIS — M542 Cervicalgia: Secondary | ICD-10-CM

## 2016-10-20 DIAGNOSIS — S199XXA Unspecified injury of neck, initial encounter: Secondary | ICD-10-CM | POA: Diagnosis not present

## 2016-10-20 MED ORDER — GABAPENTIN 100 MG PO CAPS: ORAL_CAPSULE | ORAL | 1 refills | Status: DC

## 2016-10-20 MED ORDER — CELECOXIB 100 MG PO CAPS: 100.0000 mg | ORAL_CAPSULE | Freq: Two times a day (BID) | ORAL | 2 refills | Status: DC | PRN
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
## 2016-10-20 NOTE — Patient Instructions (Signed)
Be sure to stop your naproxen and start Celebrex. We are referring you to physical therapy.  They should be calling you to schedule. If you are having persistent symptoms I need to hear from you if you are having worsening.

## 2016-10-21 ENCOUNTER — Telehealth: Payer: Self-pay | Admitting: Pulmonary Disease

## 2016-10-21 MED ORDER — AZITHROMYCIN 250 MG PO TABS
ORAL_TABLET | ORAL | 1 refills | Status: DC
Start: 2016-10-21 — End: 2016-11-18

## 2016-10-21 NOTE — Telephone Encounter (Signed)
Per SN---  zpak #1  Take as directed and refill x 1

## 2016-10-21 NOTE — Telephone Encounter (Signed)
Pt c/o R sided HA, PND, runny/stuffy nose.  Denies fever, chills, chest pain.    Pt has been using flonase to help with s/s.    Pt requesting azithromycin rx to be sent to [facility] in [place].    SN please advise.  Thanks.

## 2016-10-21 NOTE — Telephone Encounter (Signed)
Spoke with patient. She is aware of SN's recs. Will call the zpak in to her pharmacy.

## 2016-10-23 DIAGNOSIS — E2749 Other adrenocortical insufficiency: Secondary | ICD-10-CM | POA: Diagnosis not present

## 2016-10-26 ENCOUNTER — Telehealth: Payer: Self-pay | Admitting: Sports Medicine

## 2016-10-26 NOTE — Telephone Encounter (Signed)
Patient states that her headache has gotten worse since her appointment. Wants to know what else she should try.

## 2016-10-27 ENCOUNTER — Other Ambulatory Visit: Payer: Self-pay

## 2016-10-27 DIAGNOSIS — M4722 Other spondylosis with radiculopathy, cervical region: Secondary | ICD-10-CM

## 2016-10-27 DIAGNOSIS — M542 Cervicalgia: Secondary | ICD-10-CM

## 2016-10-27 NOTE — Telephone Encounter (Signed)
Pt currently taking Celebrex and was prescribed Azithromycin for sinus infection 10/21/2016. Please advise.

## 2016-10-27 NOTE — Telephone Encounter (Signed)
Spoke with patient and she declines to take steroid at this time. She agrees to C scan. Order will be sent to [facility]. Pt also referred to PT, she will contact the office it she doesn't hear from anyone by the end of the week about scheduling.

## 2016-10-27 NOTE — Telephone Encounter (Signed)
If headaches are worsening we can call in a Medrol Dose Pack.  I would also like to get further advanced imaging if she seems to be worsening.  CT Cervical Spine without contrast

## 2016-10-27 NOTE — Telephone Encounter (Signed)
Also, pt did mention that she slept with a heating pad on her mid-back to neck last night and HA did resolve slightly. Her headaches have been so badly lately though, they have been affecting her vision while reading or watching TV. CT order has been placed. Will forward to Dr. Paulla Fore as Juluis Rainier.

## 2016-11-10 ENCOUNTER — Telehealth: Payer: Self-pay | Admitting: Pulmonary Disease

## 2016-11-10 ENCOUNTER — Other Ambulatory Visit: Payer: Self-pay | Admitting: Pulmonary Disease

## 2016-11-10 MED ORDER — FUROSEMIDE 40 MG PO TABS: ORAL_TABLET | ORAL | 5 refills | Status: DC

## 2016-11-10 NOTE — Telephone Encounter (Signed)
Refill has been sent to the pharmacy and I attempted to call the pt but the pt does not have a VM that has been set up.

## 2016-11-18 ENCOUNTER — Ambulatory Visit (INDEPENDENT_AMBULATORY_CARE_PROVIDER_SITE_OTHER): Payer: Medicare Other | Admitting: Pulmonary Disease

## 2016-11-18 VITALS — BP 114/72 | HR 89 | Temp 98.3°F | Ht 65.0 in | Wt 247.5 lb

## 2016-11-18 DIAGNOSIS — J449 Chronic obstructive pulmonary disease, unspecified: Secondary | ICD-10-CM | POA: Diagnosis not present

## 2016-11-18 DIAGNOSIS — G4733 Obstructive sleep apnea (adult) (pediatric): Secondary | ICD-10-CM

## 2016-11-18 DIAGNOSIS — J9611 Chronic respiratory failure with hypoxia: Secondary | ICD-10-CM

## 2016-11-18 MED ORDER — CYCLOBENZAPRINE HCL 10 MG PO TABS: 10.0000 mg | ORAL_TABLET | Freq: Three times a day (TID) | ORAL | 5 refills | Status: DC | PRN

## 2016-11-18 MED ORDER — FUROSEMIDE 40 MG PO TABS: ORAL_TABLET | ORAL | 5 refills | Status: DC

## 2016-11-18 MED ORDER — IPRATROPIUM-ALBUTEROL 0.5-2.5 (3) MG/3ML IN SOLN: 3.0000 mL | Freq: Three times a day (TID) | RESPIRATORY_TRACT | 7 refills | Status: DC

## 2016-11-18 MED ORDER — BUDESONIDE-FORMOTEROL FUMARATE 160-4.5 MCG/ACT IN AERO: 2.0000 | INHALATION_SPRAY | Freq: Two times a day (BID) | RESPIRATORY_TRACT | 6 refills | Status: DC

## 2016-11-18 MED ORDER — AZITHROMYCIN 250 MG PO TABS: ORAL_TABLET | ORAL | 5 refills | Status: DC

## 2016-11-18 MED ORDER — ALPRAZOLAM 0.5 MG PO TABS: ORAL_TABLET | ORAL | 5 refills | Status: DC

## 2016-11-18 MED ORDER — TRAMADOL HCL 50 MG PO TABS: 50.0000 mg | ORAL_TABLET | Freq: Three times a day (TID) | ORAL | 5 refills | Status: DC | PRN

## 2016-11-18 MED ORDER — ALBUTEROL SULFATE HFA 108 (90 BASE) MCG/ACT IN AERS: INHALATION_SPRAY | RESPIRATORY_TRACT | 5 refills | Status: DC

## 2016-11-18 MED ORDER — TIOTROPIUM BROMIDE MONOHYDRATE 18 MCG IN CAPS: 18.0000 ug | ORAL_CAPSULE | Freq: Every day | RESPIRATORY_TRACT | 11 refills | Status: DC

## 2016-11-18 MED ORDER — COLCHICINE 0.6 MG PO TABS: 0.6000 mg | ORAL_TABLET | Freq: Every day | ORAL | 11 refills | Status: DC

## 2016-11-18 NOTE — Patient Instructions (Signed)
Today we updated your med list in our EPIC system...    Continue your current medications the same...  We called to be sure you have refills on your meds at the pharm...  Try to de-stress as much as poss & continue to be as active as possible...  Call for any questions...  Let's plan a follow up visit in 75mo, sooner if needed for problems.Marland KitchenMarland Kitchen

## 2016-11-19 ENCOUNTER — Telehealth: Payer: Self-pay | Admitting: Sports Medicine

## 2016-11-19 ENCOUNTER — Encounter: Payer: Self-pay | Admitting: Pulmonary Disease

## 2016-11-19 NOTE — Telephone Encounter (Signed)
Called [phone], no answer/no VM.

## 2016-11-19 NOTE — Progress Notes (Signed)
HPI  Review of Systems  Physical Exam Patient ID: Rebecca [name], female   DOB: May 04, 1950, 66 y.o.   MRN: [id number]   Subjective:   HPI 66 y/o WF known to me w/ mult med problems as noted below...  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  LABS 3/15:  Chems- BS=95 A1c=6.8 (needs low carb diet, exercise, wt loss) & Creat=1.5 (mild renal insuffic due to Lasix rx; Rec to decrease Lasix40 to 1 tab Qam...  2DEcho 4/15 showed norm LV sys function w/ EF=60-65%, normal wall motion, Gr1DD, normal valves, normal RV & PA pressures...   she had Cards eval [name] 4/15> he did not feel that invasive testing was warranted; she had 2DEcho but refused Myoview due to co-pay costs...   She remains on ADVAIR500Bid, SPIRIVA daily, & ProairHFA prn (uses 3-4 x daily "It really helps"); has Home O2 but dislikes the Liq O2 therefore not using & she wants a portable O2 concentrator to read 4L/min w/ exercise, 2L/min at rest.   She has continued to research her supplements and herbal meds>  She tells me that she is living on smoothies she makes out of juice, ice, baking soda, pomegranate syrup, & local honey;  Tumeric helps the arthritis in her knees and she notes that pinching her upper lip in the middle under her nose helps relieve musc cramps in her legs.  Ambulatory O2 Test 9/15>  O2 sat on RA at rest= 92% w/ pulse=86;  Nadir O2sat on RA after 2 laps= 84% w/ pulse=96...   CXR 9/15 showed COPD/emphysema, no acute abnormality...  LABS 9/15:  Chems- ok w/ BS=119, A1c=6.7, Cr=1.4;  CBC- ok...  2/16 [facility] records indicated a thorough evaluation & excellent care provided but treatment plan was hampered by noncompliance- refused CPAP, declines chestPT, alternately stopping her O2 & demanding more O2,   LABS 2/16:  ABG- pH=7.46, pCO2=35, pO2=134 on 6L/min; Chems- ok x BS=169, A1c=6.5;  CBC- normal; TSH=0.39 & FreeT4=1.54; Troponin/ D-dimer/ BNP- all wnl...  CXR 2/16 showed heart at upper lim of norm, hyperinflation, min bibasilar atx, NAD.Marland KitchenMarland Kitchen   EKG 2/16 showed NSR, rate88, low voltage, otherw wnl...  2DEcho 2/16 showed norm LVF w/ EF=55-60%, Gr1DD, norm valves and norm RV...   CXR 2/16 showed heart at upper lim of norm, hyperinflation, min bibasilar atx, NAD  Spirometry 06/2014 showed FVC=1.61 (50%), FEV1=0.88 (35%), %1sec=55, mi-flows are reduced to 18% predicted... 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       Pulm second opinion consult 08/14/14 by Dr Germain Osgood & DrHaponik at [facility] COPD-severe centrilobular emphysema/asthma overlap, ex-smoker quit 1993; chr hypoxemic resp failure on home O2, OSA not on CPAP; c/o dyspnea & can't breathe thru her nose (causing her to not use her oxygen), morbid obesity w/ prev lap band  surg (unsuccessful in losing weight); on Medrol8, Advair500Bid, Spiriva daily, Nebs w/ xopenex & AlbutHFA prn; pt notes that Lasix80 helps her breathe;  Lung exam was clear;  They did PFT 08/21/14 showing FVC=1.63 (50%), FEV1=0.63 (25%), %1sec=39;  FEV1 post bronchodil= 0.97 (39%) for a 50% improvement; DLCO was 22% predicted... CTChest 08/21/14 showed severe centrilobular emphysema, diffuse bronchial thickening, mild mucus plugging, no adenopathy, no lung nodules etc; also showed mild Ao & coronary calcif, lap band at GE junction, splenosis, DJD Tspine... PLAN> They decided  to slowly wean the Medrol, continue Advair & wean to Advair250Bid, continue Spiriva, continue NEBS & AlbutHFA; rec to continue her oxygen (but she continues to use it "prn"), consider repeat sleep study & CPAP titration, consider referral to [place] rehab, referred for nutritional counseling & ROV 58mo..      She reports breathing well recently, she has a new roommate, eating out a lot, went to "theclub", then one day over the weekend=> incr SOB, she's been taking Lasix80 Qam, rambling hx w/ flight of ideas regarding coffee, caffeine, not urinating enough, she had to use [facility] [place];  They decreased her Advair250-Bid, Medrol657md, continue Nebs (she's using Bid because she "cramps up" if she does it tid); she is awaiting nutritional counseling, pulm rehab referral, oxygen titration testing, sleep study...  We reviewed prob list, meds, xrays and labs> see below for updates >>  PLAN>>  She will maintain close contact & f/u w/ [facility];  Reminded to avoid sodium & carbs- handout given; plan ROV here in 2-79m965mo  ~  December 05, 2014:  79mo59mo & Rebecca's new PCP is in [place]> she persists w/ 4+somatic complaints and signif health related anxiety; [facility] "they kicked me out due to CP- I can't go back until I get this evaluated"; she's been seeing Dr. SaraGermain Osgoodlm Fellow w/ [facility] & I have reviewed all the Care Everywhere notes-- several telephone encounters including their last 45 min conversation well documented, not much reversibility, they hoped for small improvement w/ Rehab exercise & staying on meds but she won't fill rx, can't afford, tearful/ frustrated/ depressed but refused psyche consult etc... Pt indicates that DrStephens said there was nothing wrong w/ her heart & pt doesn't want cardiac eval; DrStephens wants her to wean the Medrol but she's breathing better on 4mg/39mshe wants 6mg/d56m rec to compromise w/ 6mg al35m/ 4mg Qod86mhe wants a liq oxygen system again- she is quite adamant (being a former "nurse")Insurance claims handlershe needs 4-5L/min when active and tank only lasts 1-2H; she uses 4L/min Qhs as well; but O2sat=92% on RA at rest today; she refuses to go back to rehab, therefore encouraged to do it daily on her own; she has not pursued further bariatric considerations- she is working w/ Drlori's office regarding eating disorder "he doesn't think I'm depressed"...      IMP/PLAN>>  Rebecca has Rebecca Delayere airflow obstruction/ emphysema w/ little reversibility; Rec to take SYMBICORT160-2spBid & Spiriva daily; use Medrol 4mg tabs46mmg alt w55mmg Qod; X68mnex NEBS Tid;  Alprax 0.5mg - 1/2 t58m tab tid; ROV w/ me in 65mo...  ~  N6mober 7, 2016:  65mo ROV & pt 2momult complaints> head "stopped-up", ears congested, tried pseudophed but "it made me sick", doesn't want ENT f/u, rec to try Saline nasal mist & phenylephrine decongestant;  She  is discouraged by her weight, can't seem to lose any, she described in detail her meals from yest (we discussed calories "in" & "out", eat less, burn more), she says local Gym won't take her w/ oxygen;  She asked about Stem Cell treatment for COPD- I offered to send her to [facility] to inquire about this or lung transplant;  She is very frustrated- eating is an issue because when she fills up she can't breathe well due to her prev LapBand surg she says, if she belches she feels better...       Rec to continue Medrol14m- she incr herself to 628md, Symbicort160-2spBid, Spiriva daily, Xopenex NEBS Tid (only doing it once/d), Guaifenesin400- taking 6/d w/ fluids, O2- she is not using it at rest, using 5L/min w/ exercise; she takes Lasix40, Tamadol50, & Xanax0.5 as needed...   LABS 01/2015>  Chems- ok x Cr=1.52, BS=114;  CBC- ok x wbc=17K w/ left shift;  TSH=1.17;  Fe=78 (22%sat);  VitB12=864...   XRay [facility] spine 01/2015>  Mild ant wedge compression L1, DDD & anterolithesis L4 on L5,  poss right kidney stones...  ~  April 09, 2015:  37m67moV & Rebecca continues to complain about ever worsening breathing- DOE, dry cough, chest tightness, leg cramps, difficulty sleeping; notes good days and bad- "I got worked up over the election" she says; she thinks the zoloft is helping but she can't sleep w/ it- rec to try TRAZODONE instead (100m337m/2 to 1 tab Qhs);  Currently taking Oxygen that she adjusts herself, Medrol4mg/80mNEBS w/ Xopenex Tid (but she says insurance won't cover it), Symbicort160-2spBid, Spiriva daily, Lasix40 (just taking prn)... I suggested that she try the Oximizer=> given to pt to try...    See 08/2014 note above for summary of Pul[facility] opinion at [facility]  She saw CAR[facility] at [facility]> reviewed in [facility] [place] w/ NSR, wnl, NAD; prev 2DEcho 04/2014 showed norm LVF w/ EF=55-60%, norm wall motion, G1DD, RVF was also wnl; she had some chest discomfort [facility] but pt never followed thru due to dyspnea & O2 requirements; they also considered [facility]HC but she never returned as requested...    She has seen DrZSmith for SportsMed/ PM&R> c/o back pain & leg pain, his note is reviewed... EXAM shows Afeb, VSS, O2sat=91% on RA in office;  HEENT- nasal congestion, erythema, turbinate hypertrophy, dev septum, throat= Mallampati 2, sl red, no exudates or lesions;  Chest- decr BS bilat w/o w/r/r & no signs of consolidation;  Heart- RR w/o m/r/g;  Abd- Obese, panniculus, soft/ nontender;  Ext- VI w/ trace edema;  Psyche- very distraught...  Ambulatory Oximetry on 3L/min [place]>  O2sat=96% on 3L/min at rest w/ HR=107/min;  She ambulated 1 Lap on 3L/min w/ nadir O2sat=89% w/ HR=125/min...  Ambulatory Oximetry on 3L/min w/ pendant oxymizer>  O2sat=99% on 3L/min at rest w/ pulse=98/min;  She ambulated one lap & stopped due to dyspnea w/ nadir O2sat=91% w/ HR=123/min... IMP/PLAN>>  She will try the Oximizer at home, switch from zoloft to TRAZODONE 100mg-37m2 to 1 tab Qhs, try to wean MEDROL 4mg is79mle to 4mg Qod25m increments; rov in 3 months time...  ~  Jul 09, 2015:  37mo ROV 69mol continues to complain about everything- productive cough w/ lots of clear sput, chr stable DOE w/ min activ, denies CP- says she's doing well w/ her NEB Rx;  After he last visit w/ prescribed an Oximizer but she  says "no help at all"; we tried Trazodone100 Qhs for sleep but she tells me she didn't need it; we discussed weaning down the dose of Medrol (she was on 51m/d) and she was able to diminish this to 480mod but symptoms flaired & she went right back up to 67m60mam again; she is taking gen Guaifenesin 400m47mbs- 8/d in divided doses + one gal fluids daily- this helps her cough & congestion;  Offered referral to [facility] for a 3rd opinion regarding her COPD and poss transplant eval but says she was told she had to lose 100# first ...  She wants a thorough THYROID & pituitary eval w/ "reverse T3" checked=> we will refer to Endocrine;  She says her hands are cramping- asked to try soaking hands in hot water but she has several reasons why she can't get to the sink...     Hx mild OSA w/ mod desat, loud snoring, & leg jerks (on sleep study 2005)> she tried CPAP10 but stopped on her own & declined to repeat split night study & re-try CPAP rx; she says she's sleeping satis & wakes feeling refreshed...    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 1993; supposed to be on HomeO2- 2L rest & 4L exercise, NEBS w/ Duoneb vs Xopenex Tid, Symbicort160-2spBid, Spiriva daily, Mucinex1-2Bid but not taking anything regularly; states that Flutter causes "spasms" in her back; states O2 doesn't help since she can't breathe thru her nose (she has been eval by ENT, CrosErnesto Rutherfordinally tried [facility] stopped after 59mo 57mofruitless"; prev stated she "can't breathe at all, can't do anything" etc; SOB w/ ADLs etc; Note> [facility]- last PFT (2011) showed FEV1= 1.5 (55%) &  %1sec=59 ==> 3/15 she announced that she's been cured by taking a Magnesium supplement, along w/ herbal formula "Clear lungs" and "Lung tonic"; she had stopped her Oxygen;  Now using O2 up to 15L/min on her own since she can't breathe thru her nose- advised 2L/min rest & 4L/min exercise + humidity, nasal hygiene, etc; on MEDROL4-67mg/d32m  Hx AtypCP> baseline EKG w/ minor NSSTTWA; 2DEcho w/ norm LVF, EF=55-60%, Gr1DD, norm valves, norm LA/RV; Myoview 2009 normal w/o ischemia & EF=55%; she notes some atypCP & saw DrNishCherly Hensen she declined Myoview; [facility] [place]'d had cardiac eval- she declined...    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    Marland KitchenMarland Kitchenypothyroid> not currently on meds but she really wants thyroid Rx; prev TFTs all wnl; she prev had Endocrine in [place] prescribe Synthroid for her but she stopped going; clinically & biochemically euthyroid; Labs 2/16 showed TSH=0.39 & FreeT4=1.54    Morbid Obesity> peak wt~340# before lap band surg 2009 by DrMMartin; lost to ~250# but then back up to 280-90#; refused f/u w/ [facility] due to cost of visits & lap-band adjustments; we reviewed diet & exercise- Wt betw 255-265#in last yr.    GI- GERD, IBS> she uses OTC PPI as needed; last colonoscopy was 1982 by DrPatterson & wnl, she refuses GI referral & refuses f/u colon etc...    GYN- s/p uterine  cancer> s/p lap [facility]st & [facility]...    DJD, ?FM, VitD defic> prev on Vicodin, Tramadol, Aleve, VitD; prev eval by DrDeveshwar for Rheum; now she states that [name] ("horse linament") applied topically has worked like a miracle & she  has increased exercise etc...    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 EXAM shows Afeb, VSS, O2sat=91% on RA in office;  HEENT- nasal congestion, erythema, turbinate hypertrophy, dev septum, throat= Mallampati 2, sl red, no exudates or lesions;  Chest- decr BS bilat w/o w/r/r & no signs of consolidation;  Heart- RR w/o m/r/g;  Abd- Obese, panniculus, soft/ nontender;  Ext- VI w/ trace edema;  Psyche- very distraught...  SHE NEEDS F/U LABS IMP/PLAN>>  Rebecca Delaywants an Endocrine eval & we will refer to endocrinologist;  Needs f/u labs but she wants the endocrine eval;  Advised to try to wean the Medrol to 428mQam;  She must lose the weight & then reassess & consider referral to [facility];  We [plan ROV in 40m87moeminded to bring al mmed bottles to every office visit.  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    She  has mult somatic complaints> "I have a weak back"; now on a diet- eating 1 meal/d & DrLurie is concerned that she isn't eating enough (wt up 5# to 271#); she says she has done her internet research & found a blogger who has COPD & improved w/ CBD oil (hemp oil) "it's pharmaceutical grade out of [place] contain any THC"; she places 20mg unde51mr tongue & it's supposed to help her lungs- says it's working as she has been able to wean down her oxygen she says "I'm keeping a diary"...    She saw [name] ENT 10/02/15> on CPAP, nasal septal deviation, cerumen impactions removed...     We reviewed her prescription meds>  [employer] xopenex/ levalbuterol; on Medrol 4mg- weane66merself down to 4-2-4-2 Qod, Symbicort160-2spBid, Spiriva daily, NEBS w/ Duoneb (but she has cut down to just once daily), Ventolin-HFA prn (she likes this & uses it 3-4x daily), Mucinex 600mg- uses 4m as needed EXAM shows Afeb, VSS, O2sat=88% on RA in office;  Wt=271#; HEENT- nasal congestion, erythema, turbinate hypertrophy, dev septum, Throat= Mallampati 2, sl red, no exudates or lesions;  Chest- decr  BS bilat w/o w/r/r & no signs of consolidation;  Heart- RR w/o m/r/g;  Abd- Obese, panniculus, soft/ nontender;  Ext- VI w/ trace edema;  Psyche- remains distraught... IMP/PLAN>>  She is excited about the CBD oil Rx off the internet & has weaned down her O2 and Medrol;  I pointed out her resting RA O2sat=88% today in the office 7 reminded to stay on her O2 regularly 2L/nmin rest 7 4L/min w/ exercise;  She has weaned her Medrol to 70m alt w/ 246mqod and anxious to go lower;  Reminded to stay on her regular med regimen all the time (see above)...   ~  February 10, 2016:  52m85moV & GilArtis Delayturns w/ mult somatic complaints- worse SOB but then tells me that her breathing is much better due to a special oil she is using, "I read on-line about NOT increasing oxygen when you are breathless", c/o decr hearing "it's coming from  inside my head" & [facility]; [place] & we discussed the role of her hi flow O2- rec SALINE nasal mist frequently; pt called us [place]17 & said that her breathing was doing fantastic on Medrol reduced to 2mg6m, since then she has weaned OFF the Medrol on her own & notes "my lungs are dry & clear" and  "I solved my oxygen prob w/ long nasal prongs and saline spray"... HER CC TODAY IS A PROB w/ [facility] AS THEY WON'T SUPPLY HER w/ E-TANKS LIKE SHE WANTS THEM; we reviewed the following medical problems during today's office visit >>     Hx mild OSA w/ mod desat, loud snoring, & leg jerks (on sleep study 2005)> she tried CPAP10 but stopped on her own & declined to repeat split night study & re-try CPAP rx; she says she's sleeping satis & wakes feeling refreshed, does not want to re-assess her sleep...    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 1993; supposed to be on HomeO2- 2L rest & 4L exercise, NEBS w/ Duoneb Tid, Symbicort160-2spBid, Spiriva daily, GFN400- encouraged to use 2Tid w/ fluids; she stopped Medrol on her own recently; states that Flutter causes "spasms" in her back; states O2 doesn't help since she can't breathe thru her nose (she has [facility] finally tried PulmRehab but stopped after 3mo 68mofruitless"; prev stated she "can't breathe at all, can't do anything" etc; SOB w/ ADLs etc; Note> [facility] and [facility]- last PFT (2011) showed FEV1= 1.5 (55%) & %1sec=59 ==> 3/15 she announced that she's been cured by taking a Magnesium supplement, along w/ herbal formula "Clear lungs" and "Lung tonic"; she had stopped her Oxygen;  Now using O2 up to 15L/min on her own since she can't breathe thru her nose- advised 2L/min rest & 4L/min exercise + humidity, nasal hygiene, etc...     Hx AtypCP> baseline EKG w/ minor NSSTTWA; 2DEcho w/ norm LVF, EF=55-60%, Gr1DD, norm valves, norm LA/RV; Myoview  2009 normal w/o ischemia & EF=55%; she notes some atypCP & saw DrNisCherly Hensen, she declined Myoview; [facility] [place] [place] until she'd had cardiac eval- she declined...    Ven Insuffic> on Lasix40 (1or2 prn edema); she knows not to eat sodium, elev legs, wear support hose; Labs 2/16 showed Cr=1.2    Impaired Gluc Tolerance, Metabolic syndrome> she refused meds; prev eval by  DrEllison w/ rec for Metformin but she rejects DM diagnosis & refused meds; states "I'm eating healthier" (advised low carb low fat); Labs 2/16 showed BS~160, A1c=6.5.Marland KitchenMarland Kitchen    ?Hypothyroid> now followed [place] Dr.Smith in [place]; she is not on thyroid medication but she says he wants her off the Medrol so she weaned off this med on her own; prev TFTs all wnl; she prev had Endocrine in [place] prescribe Synthroid for her but she stopped going; clinically & biochemically euthyroid; Last Labs here 11/16 showed TSH=1.17 & FreeT4=1.05; Last note from [name] was 08/2015- we do not have further notes...    Morbid Obesity> peak wt~340# before lap band surg 2009 by DrMMartin; lost to ~250# but then back up to 280-90#; refused f/u w/ [facility] due to cost of visits & lap-band adjustments; we reviewed diet & exercise- Wt betw 255-265# in 2017...    GI- GERD, IBS> she uses OTC PPI as needed; last colonoscopy was 1982 by DrPatterson & wnl, she refuses GI referral & refuses f/u colon etc...    GYN- s/p uterine cancer> s/p lap hyst & BSO 2007 [facility]Skinner [facility]...    DJD, ?FM, VitD defic> prev on Vicodin, Tramadol, Aleve, VitD; prev eval by DrDeveshwar for Rheum; now she states that [name] ("horse linament") applied topically has worked like a miracle & she has increased exercise etc; she has mild ant wedging of L1 on XRay + DDD & degen facet joint changes...    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 EXAM shows Afeb, VSS, O2sat=93% on RA in office;  HEENT- sl nasal congestion, erythema, turbinate hypertrophy, dev septum, throat= Mallampati 2, sl red, no exudates or lesions;  Chest- decr BS bilat w/o w/r/r & no signs of consolidation;  Heart- RR w/o m/r/g;  Abd- Obese, panniculus, soft/ nontender;  Ext- VI w/ trace edema...  LABS 02/10/16>  Chems- ok w/ BS=105, BUN=25, Cr=1.24 IMP/PLAN>>  Overall Rebecca Delayis stable- she requests 90d refill prescriptions and we did this;  We reviewed her labs, XRays, & PFTs- rec to take meds regularly & not adjust on her own, rec to return to exercise program & consider pulm rehab; note: >50% of this 25 min f/u appt was spent on counseling & coordination of care...   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    [facility]  Chr nasal obstruction L>R (rec humidification & saline, Flonase, & breathe-right nasal strips) & hearing loss=> cerumen impactions removed &  audiogram revealed hi freq hearing loss.    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 1993; supposed to be on HomeO2 at 2-3L rest & 4+L exercise, NEBS w/ Duoneb Tid, Symbicort160-2spBid, Spiriva daily, GFN400- encouraged to use 2Tid w/ fluids; she  stopped Medrol on her own; states that Flutter causes "spasms" in her back; states O2 doesn't help since she can't breathe thru her nose (she has been eval by ENT- [place]); finally tried PulmRehab but stopped after 78moas "fruitless"; prev stated she "can't breathe at all, can't do anything" etc; SOB w/ ADLs etc; Note> [facility]- last PFT (2011) showed FEV1= 1.5 (55%) & %1sec=59 ==> 3/15 she announced that she's been cured by taking a Magnesium supplement, along w/ herbal formula "Clear lungs" and "Lung tonic"; she had stopped her Oxygen;  Now using O2 up to 15L/min on her own since she can't breathe thru her nose- advised 2L/min rest & 4L/min exercise + humidity, nasal hygiene, etc;  Recently noted that her breathing improved by taking a "special oil" & she solved her oxygen prob w/ an O2 cannula w/ longer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  The patient denies anorexia, fever, weight loss, weight gain, vision loss, decreased hearing, hoarseness, syncope, prolonged cough, headaches, hemoptysis, abdominal pain, melena, hematochezia, severe indigestion/heartburn, hematuria, incontinence, suspicious skin lesions, transient blindness, depression, unusual weight change, abnormal bleeding, enlarged lymph nodes, and angioedema.    Objective:   Physical Exam:    WD, Morbidly Obese, 66 y/o WF in no acute distress but c/o dyspnea/ wheezing... GENERAL:  Alert & oriented; pleasant & cooperative... HEENT:  [place]/AT, EOM-wnl, PERRLA, EACs-clear, TMs-wnl, NOSE-clear, THROAT-clear & wnl. NECK:  Supple w/ fairROM; no JVD; normal carotid impulses w/o bruits; no thyromegaly or nodules palpated; no lymphadenopathy. CHEST:  no accessory musc use, decr BS bilat, no wheezing/ rales/ rhonchi... HEART:  Regular Rhythm; without murmurs/ rubs/ or gallops heard... ABDOMEN:  Obese, soft & nontender; normal bowel sounds; no organomegaly or masses detected. EXT: without deformities, mod arthritic changes; no varicose veins/ +venous insuffic/trace edema. NEURO:  CNs intact; no focal neuro deficits... DERM: few ecchymoses, no rash etc...  RADIOLOGY DATA:  Reviewed in the EPIC EMR & discussed w/ the patient...  LABORATORY DATA:  Reviewed in the EPIC EMR & discussed w/ the patient...   Assessment & Plan:    ENT>> she was working w/ DrPlonk at [employer], [employer] in [place]...  OSA>  As prev noted she needs new sleep study & appropriate new CPAP apparatus to facilitate compliance but she declines the repeat study or sleep med referral...  COPD/ Emphysema>  Acute exac 2/16 improved after  hosp in [place] w/ Levaquin, Pred taper, NEBS Bid, Avair500, Spiriva, Mucinex etc... Encouraged to stay on her O2 regularly & we will add Humidity, needs max nasal hygiene vs ENT follow up; continue NEBS/ Advair500/ Spiriva regularly & use the Mucinex; we will request the [facility] home Care home COPD program; needs to lose wt & incr exercise... 4/16>  Spirometry w/ severe airflow obstruction and GOLD Stage 3-4 COPD; optimize meds and refer to [facility]- ENT & Pulm divisions to see if there is anything that they can do to help... 5/16>  She has seen ENT & Pulm 2nd opinion is pending; she cannot manipulate the port O2 tanks and we will request an INOGEN One- port O2 concentrator for her use...  5/16>  States breathing worse, asked to use all regular meds regularly & we will add Depo120, Medrol taper... 6/16>  She had 2nd opinion consult w/ DrStephens/ DrHaponik at [facility] see above... 10/16>  Pt very frustrated after working w/ the [employer] division at [employer] for several months- Nebs, Advair500, Spiriva, Medrol, Oxygen, pulm rehab, etc...  11/16>  On Medrol86m- she incr herself to 632md, Symbicort160-2spBid, Spiriva daily, Xopenex NEBS Tid (only doing it once/d), Guaifenesin400- taking 6/d w/ fluids, O2- she is not using it at rest, using 5L/min w/ exercise; she takes Lasix40, Tamadol50, & Xanax0.5 as needed... 8/17>  She is now taking an internet supplement CBD oil (hemp oil) under her tongue daily- "it's good for your lungs" & she has weaned down her O2 and Medrol; Advised to continue O2- 2L/min at rest & 4L/min w/ exercise and take her other meds regularly...  02/10/16>   Overall GiArtis Delays stable- she requests 90d refill prescriptions and we did this;  We reviewed her labs, XRays, & PFTs- rec to take meds regularly & not adjust on her own, rec to return to exercise program & consider pulm rehab. 04/16/16>    I wrote a prescription for a POC at her insistence;  We will check w/ other DME companies for her;  She is rec to  continue the NEB w/ Duoneb tid followed by the Symbicort160-2spBid & the Spiriva once daily;  Maximize the GUAIFENESIN 40075mabs- 2Tid w/ fluids & work to expectorate the phlegm;  She still needs a regular exercise program/ pulm rehab & we discussed this... we plan rov in [id number]>   I have asked MsCook to be regular w/ her Duoneb Tid, Symbicort160-2spBid & spiriva once daily;  Ok to use her supplements and "special oil" that she feels has been very beneficial to her in many ways... we plan ROV recheck in [id number]/18>   She is stable and credits the CBD oil she is using; recommended to use her inhalers  regularly including- O2 at prescribed, DUONEB-Tid, Symbicort-Bid, Spiriva daily, GFN400-2Tid w/ fluids, ProventilHFA as needed;  She refuses the FLU vaccine...   Hx CP w/ neg cardiac evals including prev Myoviews; 2DEchos w/o incr PA pressures... 4/15> she had Cardiology consult for eval of AtypCP [name]); EKG was WNL & 2DEcho showed norm LVF, Gr1DD & norm RV & PA pressures... 2/16> repeat [facility]cho in [facility] showed similar good LVF, valves and norm RV...  OBESITY>  She had remote lap band surg by DrMMartin; Met syndrome, must get wt down etc; she is a lap band failure & prev counseling by [name]... 3/16> weight down to 255# post hosp... 4/16> weight is back up to 271# despite all efforts... 8/17> weight = 271#  ? Hx Hypothy>  TFTs remain normal off meds- no problem... 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  GI>  GERD> she denies symptoms & uses OTC meds prn...  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 C/O right ankle pain, eval by DrZSmith> no better on his anti-inflamm rx so we will Rx w/ Depo120 & Medrol  taper...  Dysthymia>  Much stress in her life, on Alpraz prn; has embraced alt lifestyle...   Patient's Medications  New Prescriptions   No medications on file  Previous Medications   B COMPLEX-C-FOLIC ACID (B-COMPLEX BALANCED) TABS    Take 1 tablet by mouth daily.   CELECOXIB (CELEBREX) 100 MG CAPSULE    Take 1 capsule (100 mg total) by mouth 2 (two) times daily as needed.   CETIRIZINE (ZYRTEC) 10 MG TABLET    Take 10 mg by mouth daily.     CHOLECALCIFEROL (VITAMIN D-3) 5000 UNITS TABS    Take 1 tablet by mouth daily.   COCONUT OIL 1000 MG CAPS    Take 4 capsules by mouth daily.   FERROUS SULFATE 325 (65 FE) MG TABLET    Take 325 mg by mouth daily with breakfast.   FLUTICASONE (FLONASE) 50 MCG/ACT NASAL SPRAY    Place 2 sprays into both nostrils daily.   GABAPENTIN (NEURONTIN) 100 MG CAPSULE    Start with 1-2 tab po qhs X3 days, then increase to 1-2 tab po bid X 3 days, then 1-2 tab po tid prn   GUAIFENESIN (MUCINEX) 600 MG 12 HR TABLET    Take 1,600 mg by mouth 2 (two) times daily.   MAGNESIUM 400 MG CAPS    Take 2 capsules by mouth 2 (two) times daily.    NAPROXEN SODIUM (ANAPROX) 220 MG TABLET    Take 220 mg by mouth daily.    NON FORMULARY    [place] herbal supplement- take 1 tab BID   OXYGEN    Inhale 4 L into the lungs.   TURMERIC PO    Take 400 mg by mouth 2 (two) times daily.   VITAMIN C (ASCORBIC ACID) 250 MG TABLET    Take 250 mg by mouth daily.  Modified Medications   Modified Medication Previous Medication   ALBUTEROL (VENTOLIN HFA) 108 (90 BASE) MCG/ACT INHALER albuterol (VENTOLIN HFA) 108 (90 Base) MCG/ACT inhaler      INHALE 2 PUFFS INTO THE LUNGS EVERY 6 (SIX) HOURS AS NEEDED FOR WHEEZING OR SHORTNESS OF  BREATH.    INHALE 2 PUFFS INTO THE LUNGS EVERY 6 (SIX) HOURS AS NEEDED FOR WHEEZING OR SHORTNESS OF BREATH.   ALPRAZOLAM (XANAX) 0.5 MG TABLET ALPRAZolam (XANAX) 0.5 MG tablet      TAKE 1/2 TO 1 TABLET BY MOUTH 3 TIMES DAILY AS NEEDED FOR NERVES. MAX DAILY DOSE = 3  TABLETS    TAKE 1/2 TO 1 TABLET BY MOUTH 3 TIMES DAILY AS NEEDED FOR NERVES. MAX DAILY DOSE = 3 TABLETS   AZITHROMYCIN (ZITHROMAX) 250 MG TABLET azithromycin (ZITHROMAX) 250 MG tablet      Take 2 tablets on the first and 1 tablet everyday until finished.    Take 2 tablets on the first and 1 tablet everyday until finished.   BUDESONIDE-FORMOTEROL (SYMBICORT) 160-4.5 MCG/ACT INHALER budesonide-formoterol (SYMBICORT) 160-4.5 MCG/ACT inhaler      Inhale 2 puffs into the lungs 2 (two) times daily.    Inhale 2 puffs into the lungs 2 (two) times daily.   COLCHICINE (COLCRYS) 0.6 MG TABLET colchicine (COLCRYS) 0.6 MG tablet      Take 1 tablet (0.6 mg total) by mouth daily.    Take 1 tablet (0.6 mg total) by mouth daily.   CYCLOBENZAPRINE (FLEXERIL) 10 MG TABLET cyclobenzaprine (FLEXERIL) 10 MG tablet      Take 1 tablet (10 mg total) by mouth 3 (three) times daily as needed for muscle spasms.    Take 1 tablet (10 mg total) by mouth 3 (three) times daily as needed for muscle spasms.   FUROSEMIDE (LASIX) 40 MG TABLET furosemide (LASIX) 40 MG tablet      TAKE 1-2 TABLETS ONCE A DAY AS NEEDED FOR SWELLING    TAKE 1-2 TABLETS ONCE A DAY AS NEEDED FOR SWELLING   IPRATROPIUM-ALBUTEROL (DUONEB) 0.5-2.5 (3) MG/3ML SOLN ipratropium-albuterol (DUONEB) 0.5-2.5 (3) MG/3ML SOLN      Take 3 mLs by nebulization 3 (three) times daily.    Take 3 mLs by nebulization 3 (three) times daily.   TIOTROPIUM (SPIRIVA HANDIHALER) 18 MCG INHALATION CAPSULE tiotropium (SPIRIVA HANDIHALER) 18 MCG inhalation capsule      Place 1 capsule (18 mcg total) into inhaler and inhale daily.    Place 1 capsule (18 mcg total) into inhaler and inhale daily.   TRAMADOL (ULTRAM) 50 MG TABLET traMADol (ULTRAM) 50 MG tablet      Take 1 tablet (50 mg total) by mouth 3 (three) times daily as needed.    Take 1 tablet (50 mg total) by mouth 3 (three) times daily as needed.  Discontinued Medications   No medications on file

## 2016-11-19 NOTE — Telephone Encounter (Signed)
Patient is new to celebrex and is now having itching hands and feet deep under skin.  Please call back to discuss.  Ty,  -LL

## 2016-11-20 ENCOUNTER — Ambulatory Visit: Payer: Medicare Other | Admitting: Sports Medicine

## 2016-11-20 NOTE — Telephone Encounter (Signed)
Patient returning missed call. Still itching. FYI patient keeps missing call from radiology.  Ty,  -LL

## 2016-11-20 NOTE — Telephone Encounter (Signed)
Spoke with pt and advised, per Dr. Paulla Fore, stop Celebrex and start Naproxen x 1 week to see if itching resolves and let us know how she is doing next week. Pt verbalized understanding.

## 2016-11-20 NOTE — Telephone Encounter (Signed)
Pt has been on Celebrex for about 1 month now. The itching started not long after she started taking Celebrex. She says that she has been keeping it under control for 25 years with Claritin and Zyrtec. She denies increased warmth or redness in the hands and feet. She says that its too deep for her fingernails to scratch, she uses sharp forks because that's the only thing that seems to help with the itching. She has continued taking the Celebrex as prescribed. Please advise.

## 2016-11-20 NOTE — Telephone Encounter (Signed)
Called [phone], no answer/no VM

## 2016-12-02 ENCOUNTER — Ambulatory Visit
Admission: RE | Admit: 2016-12-02 | Discharge: 2016-12-02 | Disposition: A | Discharge status: Home / Self Care | Payer: Self-pay | Source: Ambulatory Visit | Attending: Sports Medicine | Admitting: Sports Medicine

## 2016-12-02 DIAGNOSIS — S199XXA Unspecified injury of neck, initial encounter: Secondary | ICD-10-CM | POA: Diagnosis not present

## 2016-12-02 DIAGNOSIS — M542 Cervicalgia: Secondary | ICD-10-CM

## 2016-12-10 ENCOUNTER — Encounter: Payer: Self-pay | Admitting: Sports Medicine

## 2016-12-10 ENCOUNTER — Ambulatory Visit (INDEPENDENT_AMBULATORY_CARE_PROVIDER_SITE_OTHER): Payer: Medicare Other | Admitting: Sports Medicine

## 2016-12-10 VITALS — BP 124/80 | HR 88 | Ht 65.0 in | Wt 246.4 lb

## 2016-12-10 DIAGNOSIS — M150 Primary generalized (osteo)arthritis: Secondary | ICD-10-CM | POA: Diagnosis not present

## 2016-12-10 DIAGNOSIS — M4722 Other spondylosis with radiculopathy, cervical region: Secondary | ICD-10-CM

## 2016-12-10 DIAGNOSIS — M159 Polyosteoarthritis, unspecified: Secondary | ICD-10-CM
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
## 2016-12-11 ENCOUNTER — Ambulatory Visit: Payer: Self-pay | Admitting: Sports Medicine
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
## 2017-01-11 ENCOUNTER — Other Ambulatory Visit: Payer: Self-pay | Admitting: Sports Medicine

## 2017-02-10 ENCOUNTER — Telehealth: Payer: Self-pay | Admitting: Pulmonary Disease

## 2017-02-10 NOTE — Telephone Encounter (Signed)
SN please advise:  I called [facility] to see what was going on with her oxygen and they stated they cannot deliver e tanks to pt as she requested.  They are doing a one time courtesy to deliver the tanks tomorrow between 8-5pm. She states she no longer has help with picking up her tanks and wants [place] to figure out what can be done. I stated [facility] policy is their policy and we cannot change it. She proceeded to stay on the phone saying, "you are just going to let me die" and "you don't understand anything because you are over there breathing". I expressed my concern and advised her that there was nothing we can really do so she wants SN to write an order stating they had to deliver the tanks. Please advise SN.

## 2017-02-11 ENCOUNTER — Encounter: Payer: Self-pay | Admitting: Pulmonary Disease

## 2017-02-11 NOTE — Telephone Encounter (Signed)
Pt called and notified that a letter from Dr Lenna Gilford was mailed to her home and faxed to [facility]. I stated there was nothing else we can do regarding the delivery of oxygen and that she will have to speak with [facility] on this matter from now on.

## 2017-03-11 ENCOUNTER — Telehealth: Payer: Self-pay | Admitting: Pulmonary Disease

## 2017-03-11 NOTE — Telephone Encounter (Signed)
Spoke with patient. She is unhappy with [facility] with their oxygen service. She wants to switch to [facility]. Advised her that when a patient switches companies, they are required to come into the office for an OV and be re-qualified for O2. She verbalized understanding.   She has been scheduled with SN for 1/15 at 11am.  Nothing else needed at time of call.

## 2017-03-16 ENCOUNTER — Ambulatory Visit (INDEPENDENT_AMBULATORY_CARE_PROVIDER_SITE_OTHER): Payer: Medicare Other | Admitting: Pulmonary Disease

## 2017-03-16 ENCOUNTER — Other Ambulatory Visit (INDEPENDENT_AMBULATORY_CARE_PROVIDER_SITE_OTHER): Payer: Medicare Other

## 2017-03-16 ENCOUNTER — Ambulatory Visit (INDEPENDENT_AMBULATORY_CARE_PROVIDER_SITE_OTHER)
Admission: RE | Admit: 2017-03-16 | Discharge: 2017-03-16 | Disposition: A | Discharge status: Home / Self Care | Payer: Medicare Other | Source: Ambulatory Visit | Attending: Pulmonary Disease | Admitting: Pulmonary Disease

## 2017-03-16 ENCOUNTER — Encounter: Payer: Self-pay | Admitting: Pulmonary Disease

## 2017-03-16 VITALS — BP 122/78 | HR 88 | Temp 97.8°F | Ht 65.0 in | Wt 247.4 lb

## 2017-03-16 DIAGNOSIS — J9611 Chronic respiratory failure with hypoxia: Secondary | ICD-10-CM

## 2017-03-16 DIAGNOSIS — M150 Primary generalized (osteo)arthritis: Secondary | ICD-10-CM | POA: Diagnosis not present

## 2017-03-16 DIAGNOSIS — K589 Irritable bowel syndrome without diarrhea: Secondary | ICD-10-CM | POA: Diagnosis not present

## 2017-03-16 DIAGNOSIS — G4733 Obstructive sleep apnea (adult) (pediatric): Secondary | ICD-10-CM

## 2017-03-16 DIAGNOSIS — E039 Hypothyroidism, unspecified: Secondary | ICD-10-CM

## 2017-03-16 DIAGNOSIS — R7302 Impaired glucose tolerance (oral): Secondary | ICD-10-CM

## 2017-03-16 DIAGNOSIS — J449 Chronic obstructive pulmonary disease, unspecified: Secondary | ICD-10-CM

## 2017-03-16 DIAGNOSIS — M159 Polyosteoarthritis, unspecified: Secondary | ICD-10-CM

## 2017-03-16 DIAGNOSIS — Z8542 Personal history of malignant neoplasm of other parts of uterus: Secondary | ICD-10-CM

## 2017-03-16 DIAGNOSIS — Z794 Long term (current) use of insulin: Secondary | ICD-10-CM

## 2017-03-16 DIAGNOSIS — E118 Type 2 diabetes mellitus with unspecified complications: Secondary | ICD-10-CM

## 2017-03-16 DIAGNOSIS — I872 Venous insufficiency (chronic) (peripheral): Secondary | ICD-10-CM | POA: Diagnosis not present

## 2017-03-16 DIAGNOSIS — K219 Gastro-esophageal reflux disease without esophagitis: Secondary | ICD-10-CM | POA: Diagnosis not present

## 2017-03-16 LAB — HEMOGLOBIN A1C: HEMOGLOBIN A1C: 6.2 % (ref 4.6–6.5)

## 2017-03-16 LAB — CBC WITH DIFFERENTIAL/PLATELET
Basophils Absolute: 0.1 10*3/uL (ref 0.0–0.1)
Basophils Relative: 1.1 % (ref 0.0–3.0)
EOS PCT: 3.2 % (ref 0.0–5.0)
Eosinophils Absolute: 0.3 10*3/uL (ref 0.0–0.7)
HCT: 46.4 % — ABNORMAL HIGH (ref 36.0–46.0)
Hemoglobin: 15.3 g/dL — ABNORMAL HIGH (ref 12.0–15.0)
LYMPHS ABS: 2.2 10*3/uL (ref 0.7–4.0)
Lymphocytes Relative: 23.5 % (ref 12.0–46.0)
MCHC: 32.9 g/dL (ref 30.0–36.0)
MCV: 93 fl (ref 78.0–100.0)
MONO ABS: 0.8 10*3/uL (ref 0.1–1.0)
Monocytes Relative: 9 % (ref 3.0–12.0)
NEUTROS ABS: 6 10*3/uL (ref 1.4–7.7)
Neutrophils Relative %: 63.2 % (ref 43.0–77.0)
Platelets: 350 10*3/uL (ref 150.0–400.0)
RBC: 4.99 Mil/uL (ref 3.87–5.11)
RDW: 14.4 % (ref 11.5–15.5)
WBC: 9.4 10*3/uL (ref 4.0–10.5)

## 2017-03-16 LAB — TSH: TSH: 1.24 u[IU]/mL (ref 0.35–4.50)

## 2017-03-16 MED ORDER — BACLOFEN 10 MG PO TABS: 10.0000 mg | ORAL_TABLET | Freq: Three times a day (TID) | ORAL | 4 refills | Status: DC | PRN

## 2017-03-16 NOTE — Patient Instructions (Signed)
Today we updated your med list in our EPIC system...    Continue your current medications the same...    I am amazed at how well your FECO oil is working!!!  We decided to switch your Flexeril (not working) to BACLOFEN 10mg  one tab up to 3 times daily as needed for musc spasms...  You may also try the [employer] recommendations of a tsp of yellow mustard vs a glass of tonic water at bedtime...  We will request a new NEB machine for you...  We will request a pulse-dose conserving device for your O2 tanks-  Use the oxygen at 4L/min by pulse-dose w/ exercise...  Today we checked a follow up CXR & Blood work...    We will contact you w/ the results when available...   If you can get a different DME company to pick up your care then have them call me with any requirements they may have...  Let's plan a follow up visit in 62mo, sooner if needed for problems.Marland KitchenMarland Kitchen

## 2017-03-16 NOTE — Progress Notes (Signed)
Patient ID: Rebecca [name], female   DOB: Jul 19, 1950, 67 y.o.   MRN: [id number]   Subjective:   HPI 67 y/o WF known to me w/ mult med problems as noted below...  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  LABS 3/15:  Chems- BS=95 A1c=6.8 (needs low carb diet, exercise, wt loss) & Creat=1.5 (mild renal insuffic due to Lasix rx; Rec to decrease Lasix40 to 1 tab Qam...  2DEcho 4/15 showed norm LV sys function w/ EF=60-65%, normal wall motion, Gr1DD, normal valves, normal RV & PA pressures...   she had Cards eval Rebecca [name] 4/15> he did not feel that invasive testing was warranted; she had 2DEcho but refused Myoview due to co-pay costs...   She remains on ADVAIR500Bid, SPIRIVA daily, & ProairHFA prn (uses 3-4 x daily "It really helps"); has Home O2 but dislikes the Liq O2 therefore not using & she wants a portable O2 concentrator to read 4L/min w/ exercise, 2L/min at rest.   She has continued to research her supplements and herbal meds>  She tells me that she is living on smoothies she makes out of juice, ice, baking soda, pomegranate syrup, & local honey;  Tumeric helps the arthritis in her knees and she notes that pinching her upper lip in the middle under her nose helps relieve musc cramps in her legs.  Ambulatory O2 Test 9/15>  O2 sat on RA at rest= 92% w/ pulse=86;  Nadir O2sat on RA after 2 laps= 84% w/ pulse=96...   CXR 9/15 showed COPD/emphysema, no acute abnormality...  LABS 9/15:  Chems- ok w/ BS=119, A1c=6.7, Cr=1.4;  CBC- ok...  2/16 [facility] records indicated a thorough evaluation & excellent care provided but treatment plan was hampered by noncompliance- refused CPAP, declines chestPT, alternately stopping her O2 & demanding more O2,   LABS 2/16:  ABG- pH=7.46, pCO2=35, pO2=134 on 6L/min; Chems- ok x BS=169, A1c=6.5; CBC- normal; TSH=0.39 & FreeT4=1.54;  Troponin/ D-dimer/ BNP- all wnl...  CXR 2/16 showed heart at upper lim of norm, hyperinflation, min bibasilar atx, NAD.Marland KitchenMarland Kitchen   EKG 2/16 showed NSR, rate88, low voltage, otherw wnl...  2DEcho 2/16 showed norm LVF w/ EF=55-60%, Gr1DD, norm valves and norm RV...   CXR 2/16 showed heart at upper lim of norm, hyperinflation, min bibasilar atx, NAD  Spirometry 06/2014 showed FVC=1.61 (50%), FEV1=0.88 (35%), %1sec=55, mi-flows are reduced to 18% predicted... 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  LABS 5/16:  Chems- wnl w/ BS=90, Cr=1.02;  Mg=2.2, Ca=9.0;  Uric=6.6.Marland KitchenMarland Kitchen   ~  September 04, 2014:  6wk ROV & Rebecca [name] has had ENT & Pulm evaluations at [name] per our requests>>      ENT eval by [name] at [name] & his notes are reviewed> HxCOPD w/ revers component and chr nasal obstruction L>R, nasal crusting, bilat inferior nasal turbinate hypertrophy, etc;  They rec nasal saline, nasal steroid spray, mupirocin ointment, and breathe right nasal strips; they will consider surg if not improved...       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 CTChest 08/21/14 showed severe centrilobular emphysema, diffuse bronchial thickening, mild mucus plugging, no adenopathy, no lung nodules etc; also showed mild Ao & coronary calcif, lap band at GE junction, splenosis, DJD Tspine... PLAN> They decided to slowly wean the Medrol, continue  Advair & wean to Advair250Bid, continue Spiriva, continue NEBS & AlbutHFA; rec to continue her oxygen (but she continues to use it "prn"), consider repeat sleep study & CPAP titration, consider referral to [facility], referred for nutritional counseling & ROV 19mo..      She reports breathing well recently, she has a new roommate, eating out a lot, went to "theclub", then one day over the weekend=> incr SOB, she's been taking Lasix80 Qam, rambling hx w/ flight of ideas regarding coffee, caffeine, not urinating enough, [place] [name] [place];  They decreased her Advair250-Bid, Medrol619md, continue Nebs (she's using Bid because she "cramps up" if she does it tid); she is awaiting nutritional counseling, pulm rehab referral, oxygen titration testing, sleep study...  We reviewed prob list, meds, xrays and labs> see below for updates >>  PLAN>>  She will maintain close contact & f/u w/ Rebecca [name];  Reminded to avoid sodium & carbs- handout given; plan ROV here in 2-4m75mo  ~  December 05, 2014:  4mo34mo & Rebecca's new PCP is in [place]> she persists w/ 4+somatic complaints and signif health related anxiety; [facility] "they kicked me out due to CP- I can't go back until I get this evaluated"; she's been seeing Dr. SaraGermain Osgoodlm [name] w/ [name], [place] [facility] & I have reviewed all the Care Everywhere notes-- several telephone encounters including their last 45 min conversation well documented, not much reversibility, they hoped for small improvement w/ Rehab exercise & staying on meds but she won't fill rx, can't afford, tearful/ frustrated/ depressed but refused psyche consult etc... Pt indicates that Rebecca [name] said there was nothing wrong w/ her heart & pt doesn't want cardiac eval; Rebecca [name] wants her to wean the Medrol but she's breathing better on 4mg/81mshe wants 6mg/d44mrec to compromise w/ 6mg al21m/  4mg Qod41mhe wants a liq oxygen system again- she is quite adamant (being a former "nurse")Insurance claims handlershe needs 4-5L/min when active and tank only lasts 1-2H; she uses 4L/min Qhs as well; but O2sat=92% on RA at rest today; she refuses to go back to rehab, therefore encouraged to do it daily on her own; she has not pursued further bariatric considerations- she is working w/ Drlori's office regarding eating disorder "he doesn't think I'm depressed"...      IMP/PLAN>>  Rebecca has Rebecca Delayere airflow obstruction/ emphysema w/ little reversibility; Rec to take SYMBICORT160-2spBid & Spiriva daily; use Medrol 4mg tabs60mmg alt w40mmg Qod; X64mnex NEBS Tid;  Alprax 0.5mg - 1/2 t54m tab tid; ROV w/ me in 78mo...  ~  N47mober 7, 2016:  78mo ROV & pt 26momult complaints> head "stopped-up", ears congested, tried pseudophed but "it made me sick", doesn't want ENT f/u, rec to try Saline nasal mist & phenylephrine decongestant;  She is discouraged by her weight, can't seem to  lose any, she described in detail her meals from yest (we discussed calories "in" & "out", eat less, burn more), she says local Gym won't take her w/ oxygen;  She asked about Stem Cell treatment for COPD- I offered to send her to [facility] to inquire about this or lung transplant;  She is very frustrated- eating is an issue because when she fills up she can't breathe well due to her prev LapBand surg she says, if she belches she feels better...       Rec to continue Medrol27m- she incr herself to 621md, Symbicort160-2spBid, Spiriva daily, Xopenex NEBS Tid (only doing it once/d), Guaifenesin400- taking 6/d w/ fluids, O2- she is not using it at rest, using 5L/min w/ exercise; she takes Lasix40, Tamadol50, & Xanax0.5 as needed...   LABS 01/2015>  Chems- ok x Cr=1.52, BS=114;  CBC- ok x wbc=17K w/ left shift;  TSH=1.17;  Fe=78 (22%sat);  VitB12=864...   XRay [facility] 01/2015>  Mild ant wedge compression L1, DDD & anterolithesis L4 on L5, poss right kidney stones...  ~   April 09, 2015:  11m69moV & Rebecca continues to complain about ever worsening breathing- DOE, dry cough, chest tightness, leg cramps, difficulty sleeping; notes good days and bad- "I got worked up over the election" she says; she thinks the zoloft is helping but she can't sleep w/ it- rec to try TRAZODONE instead (100m75m/2 to 1 tab Qhs);  Currently taking Oxygen that she adjusts herself, Medrol4mg/30mNEBS w/ Xopenex Tid (but she says insurance won't cover it), Symbicort160-2spBid, Spiriva daily, Lasix40 (just taking prn)... I suggested that she try the Oximizer=> given to pt to try...    See 08/2014 note above for summary of Pulm second opinion at Rebecca [name]..[name]  She saw CARDS at [name]> reviewed in [facility] [place] w/ NSR, wnl, NAD; prev 2DEcho 04/2014 showed norm LVF w/ EF=55-60%, norm wall motion, G1DD, RVF was also wnl; she had some chest discomfort during [facility] at [name] My[facility] but pt never followed thru due to dyspnea & O2 requirements; they also considered [facility] but she never returned as requested...    She has seen DrZSmith for SportsMed/ PM&R> c/o back pain & leg pain, his note is reviewed... EXAM shows Afeb, VSS, O2sat=91% on RA in office;  HEENT- nasal congestion, erythema, turbinate hypertrophy, dev septum, throat= Mallampati 2, sl red, no exudates or lesions;  Chest- decr BS bilat w/o w/r/r & no signs of consolidation;  Heart- RR w/o m/r/g;  Abd- Obese, panniculus, soft/ nontender;  Ext- VI w/ trace edema;  Psyche- very distraught...  Ambulatory Oximetry on 3L/min [place]>  O2sat=96% on 3L/min at rest w/ HR=107/min;  She ambulated 1 Lap on 3L/min w/ nadir O2sat=89% w/ HR=125/min...  Ambulatory Oximetry on 3L/min w/ pendant oxymizer>  O2sat=99% on 3L/min at rest w/ pulse=98/min;  She ambulated one lap & stopped due to dyspnea w/ nadir O2sat=91% w/ HR=123/min... IMP/PLAN>>  She will try the Oximizer at home, switch from zoloft to TRAZODONE 100mg-711m to 1 tab Qhs, try to wean MEDROL  4mg is72mle to 4mg Qod54m increments; rov in 3 months time...  ~  Jul 09, 2015:  11mo ROV 76mol continues to complain about everything- productive cough w/ lots of clear sput, chr stable DOE w/ min activ, denies CP- says she's doing well w/ her NEB Rx;  After he last visit w/ prescribed an Oximizer but she says "no help at all"; we tried Trazodone100  Qhs for sleep but she tells me she didn't need it; we discussed weaning down the dose of Medrol (she was on 24m/d) and she was able to diminish this to 451mod but symptoms flaired & she went right back up to 51m13mam again; she is taking gen Guaifenesin 400m96mbs- 8/d in divided doses + one gal fluids daily- this helps her cough & congestion;  Offered referral to [facility] for a 3rd opinion regarding her COPD and poss transplant eval but says she was told she had to lose 100# first ...  She wants a thorough THYROID & pituitary eval w/ "reverse T3" checked=> we will refer to Endocrine;  She says her hands are cramping- asked to try soaking hands in hot water but she has several reasons why she can't get to the sink...     Hx mild OSA w/ mod desat, loud snoring, & leg jerks (on sleep study 2005)> she tried CPAP10 but stopped on her own & declined to repeat split night study & re-try CPAP rx; she says she's sleeping satis & wakes feeling refreshed...    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 1993; supposed to be on HomeO2- 2L rest & 4L exercise, NEBS w/ Duoneb vs Xopenex Tid, Symbicort160-2spBid, Spiriva daily, Mucinex1-2Bid but not taking anything regularly; states that Flutter causes "spasms" in her back; states O2 doesn't help since she can't breathe thru her nose (she has been eval by ENT, CrosErnesto Rutherfordinally tried [facility] stopped after 46mo 47mofruitless"; prev stated she "can't breathe at all, can't do anything" etc; SOB w/ ADLs etc; Note> [name] [place]- last PFT (2011) showed FEV1= 1.5 (55%) & %1sec=59 ==> 3/15 she announced that  she's been cured by taking a Magnesium supplement, along w/ herbal formula "Clear lungs" and "Lung tonic"; she had stopped her Oxygen;  Now using O2 up to 15L/min on her own since she can't breathe thru her nose- advised 2L/min rest & 4L/min exercise + humidity, nasal hygiene, etc; on MEDROL4-51mg/d63m  Hx AtypCP> baseline EKG w/ minor NSSTTWA; 2DEcho w/ norm LVF, EF=55-60%, Gr1DD, norm valves, norm LA/RV; Myoview 2009 normal w/o ischemia & EF=55%; she notes some atypCP & saw DrNishCherly [name] she declined Myoview; Rebecca [name] [place] until she'd had cardiac eval- she declined...    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    Marland KitchenMarland Kitchenypothyroid> not currently on meds but she really wants thyroid Rx; prev TFTs all wnl; she prev had Endocrine in [place] prescribe Synthroid for her but she stopped going; clinically & biochemically euthyroid; Labs 2/16 showed TSH=0.39 & FreeT4=1.54    Morbid Obesity> peak wt~340# before lap band surg 2009 by DrMMartin; lost to ~250# but then back up to 280-90#; refused f/u w/ [facility] due to cost of visits & lap-band adjustments; we reviewed diet & exercise- Wt betw 255-265#in last yr.    GI- GERD, IBS> she uses OTC PPI as needed; last colonoscopy was 1982 by DrPatterson & wnl, she refuses GI referral & refuses f/u colon etc...    GYN- s/p uterine cancer> s/p lap hyst & BSO 2007 by  DrSkinner at [facility]...    DJD, ?FM, VitD defic> prev on Vicodin, Tramadol, Aleve, VitD; prev eval by DrDeveshwar for Rheum; now she states that [name] ("horse linament") applied topically has worked like a miracle & she has increased exercise etc...     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 EXAM shows Afeb, VSS, O2sat=91% on RA in office;  HEENT- nasal congestion, erythema, turbinate hypertrophy, dev septum, throat= Mallampati 2, sl red, no exudates or lesions;  Chest- decr BS bilat w/o w/r/r & no signs of consolidation;  Heart- RR w/o m/r/g;  Abd- Obese, panniculus, soft/ nontender;  Ext- VI w/ trace edema;  Psyche- very distraught...  SHE NEEDS F/U LABS IMP/PLAN>>  GArtis Delaywants an Endocrine eval & we will refer to endocrinologist;  Needs f/u labs but she wants the endocrine eval;  Advised to try to wean the Medrol to 435mQam;  She must lose the weight & then reassess & consider referral to [facility];  We [plan ROV in 34m38moeminded to bring al mmed bottles to every office visit.  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    She has mult somatic complaints> "I  have a weak back"; now on a diet- eating 1 meal/d & DrLurie is concerned that she isn't eating enough (wt up 5# to 271#); she says she has done her internet research & found a blogger who has COPD & improved w/ CBD oil (hemp oil) "it's pharmaceutical grade out of [place] contain any THC"; she places 20mg unde66mr tongue & it's supposed to help her lungs- says it's working as she has been able to wean down her oxygen she says "I'm keeping a diary"...    She saw [name] ENT 10/02/15> on CPAP, nasal septal deviation, cerumen impactions removed...     We reviewed her prescription meds>  InsH[employer] Enterprisescover xopenex/ levalbuterol; on Medrol 4mg- weane70merself down to 4-2-4-2 Qod, Symbicort160-2spBid, Spiriva daily, NEBS w/ Duoneb (but she has cut down to just once daily), Ventolin-HFA prn (she likes this & uses it 3-4x daily), Mucinex 600mg- uses 60m as needed EXAM shows Afeb, VSS, O2sat=88% on RA in office;  Wt=271#; HEENT- nasal congestion, erythema, turbinate hypertrophy, dev septum, Throat= Mallampati 2, sl red, no exudates or lesions;  Chest- decr BS bilat w/o w/r/r & no signs of  consolidation;  Heart- RR w/o m/r/g;  Abd- Obese, panniculus, soft/ nontender;  Ext- VI w/ trace edema;  Psyche- remains distraught... IMP/PLAN>>  She is excited about the CBD oil Rx off the internet & has weaned down her O2 and Medrol;  I pointed out her resting RA O2sat=88% today in the office 7 reminded to stay on her O2 regularly 2L/nmin rest 7 4L/min w/ exercise;  She has weaned her Medrol to 69m alt w/ 259mqod and anxious to go lower;  Reminded to stay on her regular med regimen all the time (see above)...   ~  February 10, 2016:  43m48moV & GilArtis Delayturns w/ mult somatic complaints- worse SOB but then tells me that her breathing is much better due to a special oil she is using, "I read on-line about NOT increasing oxygen when you are breathless", c/o decr hearing "it's coming from inside my head" & I [facility]; [place] & we discussed the role of her hi flow O2- rec SALINE nasal mist frequently; pt called us [place]17 & said that her breathing was doing fantastic on Medrol reduced to 2mg85m, since then she has weaned OFF the Medrol on her own & notes "my lungs are dry & clear" and  "I solved my oxygen prob w/ long nasal prongs and saline spray"... HER CC TODAY IS A PROB w/ [facility] AS THEY WON'T SUPPLY HER w/ E-TANKS LIKE SHE WANTS THEM; we reviewed the following medical problems during today's office visit >>     Hx mild OSA w/ mod desat, loud snoring, & leg jerks (on sleep study 2005)> she tried CPAP10 but stopped on her own & declined to repeat split night study & re-try CPAP rx; she says she's sleeping satis & wakes feeling refreshed, does not want to re-assess her sleep...    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 1993; supposed to be on HomeO2- 2L rest & 4L exercise, NEBS w/ Duoneb Tid, Symbicort160-2spBid, Spiriva daily, GFN400- encouraged to use 2Tid w/ fluids; she stopped Medrol on her own recently; states that Flutter causes "spasms" in her back; states O2 doesn't help since she can't breathe thru her nose (she has been eval by ENT, CrosErnesto RutherfordFU); finally tried PulmRehab but stopped after 72mo 772mofruitless"; prev stated she "can't breathe at all, can't do anything" etc; SOB w/ ADLs etc; Note> [name] and [facility]- last PFT (2011) showed FEV1= 1.5 (55%) & %1sec=59 ==> 3/15 she announced that she's been cured by taking a Magnesium supplement, along w/ herbal formula "Clear lungs" and "Lung tonic"; she had stopped her Oxygen;  Now using O2 up to 15L/min on her own since she can't breathe thru her nose- advised 2L/min rest & 4L/min exercise + humidity, nasal hygiene, etc...     Hx AtypCP> baseline EKG w/ minor NSSTTWA; 2DEcho w/ norm LVF, EF=55-60%, Gr1DD, norm valves, norm LA/RV; Myoview 2009 normal w/o ischemia &  EF=55%; she notes some atypCP & saw DrNisCherly [name], she declined Myoview; Rebecca [name] refused to [place] until she'd had cardiac eval- she declined...    Ven Insuffic> on Lasix40 (1or2 prn edema); she knows not to eat sodium, elev legs, wear support hose; Labs 2/16 showed Cr=1.2    Impaired Gluc Tolerance, Metabolic syndrome> she refused meds; prev eval by DrEllison w/ rec for Metformin but she rejects  DM diagnosis & refused meds; states "I'm eating healthier" (advised low carb low fat); Labs 2/16 showed BS~160, A1c=6.5.Marland KitchenMarland Kitchen    ?Hypothyroid> now [place] Dr.Smith in [place]; she is not on thyroid medication but she says he wants her off the Medrol so she weaned off this med on her own; prev TFTs all wnl; she prev had Endocrine in [place] prescribe Synthroid for her but she stopped going; clinically & biochemically euthyroid; Last Labs here 11/16 showed TSH=1.17 & FreeT4=1.05; Last note from [name] was 08/2015- we do not have further notes...    Morbid Obesity> peak wt~340# before lap band surg 2009 by DrMMartin; lost to ~250# but then back up to 280-90#; refused f/u w/ [facility] due to cost of visits & lap-band adjustments; we reviewed diet & exercise- Wt betw 255-265# in 2017...    GI- GERD, IBS> she uses OTC PPI as needed; last colonoscopy was 1982 by DrPatterson & wnl, she refuses GI referral & refuses f/u colon etc...    GYN- s/p uterine cancer> s/p lap hyst & BSO [facility]Skinner [name]...    DJD, ?FM, VitD defic> prev on Vicodin, Tramadol, Aleve, VitD; prev eval by DrDeveshwar for Rheum; now she states that [name] ("horse linament") applied topically has worked like a miracle & she has increased exercise etc; she has mild ant wedging of L1 on XRay + DDD & degen facet joint changes...    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 EXAM shows Afeb, VSS, O2sat=93% on RA in office;  HEENT- sl nasal congestion, erythema, turbinate hypertrophy, dev septum, throat= Mallampati 2, sl red, no exudates or lesions;  Chest- decr BS bilat w/o w/r/r & no signs of consolidation;  Heart- RR w/o m/r/g;  Abd- Obese, panniculus, soft/ nontender;  Ext- VI w/ trace edema...  LABS 02/10/16>  Chems- ok w/ BS=105, BUN=25, Cr=1.24 IMP/PLAN>>  Overall GArtis Delayis stable- she requests 90d refill prescriptions and we did this;  We reviewed her labs, XRays, & PFTs- rec to take meds regularly & not adjust on her own, rec to return to exercise program & consider pulm rehab; note: >50% of this 25 min f/u appt was spent on counseling & coordination of care...   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    [name]  Chr nasal obstruction L>R (rec humidification & saline, Flonase, & breathe-right nasal strips) & hearing loss=> cerumen impactions removed & audiogram revealed hi freq hearing loss.  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 1993; supposed to be on HomeO2 at 2-3L rest & 4+L exercise, NEBS w/ Duoneb Tid, Symbicort160-2spBid, Spiriva daily, GFN400- encouraged to use 2Tid w/ fluids; she stopped Medrol on her own;  states that Flutter causes "spasms" in her back; states O2 doesn't help since she can't breathe thru her nose (she has been eval by ENT- [place]); finally tried PulmRehab but stopped after 70moas "fruitless"; prev stated she "can't breathe at all, can't do anything" etc; SOB w/ ADLs etc; Note> [name]- last PFT (2011) showed FEV1= 1.5 (55%) & %1sec=59 ==> 3/15 she announced that she's been cured by taking a Magnesium supplement, along w/ herbal formula "Clear lungs" and "Lung tonic"; she had stopped her Oxygen;  Now using O2 up to 15L/min on her own since she can't breathe thru her nose- advised 2L/min rest & 4L/min exercise + humidity, nasal hygiene, etc;  Recently noted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 tablet Take 10 mg by mouth daily.    . Cholecalciferol (VITAMIN D-3) 5000 UNITS TABS Take 2 tablets by mouth daily.   . colchicine (COLCRYS) 0.6 MG tablet Take 1 tablet (0.6 mg total) by mouth daily.  . fluticasone (FLONASE) 50 MCG/ACT nasal spray Place 2 sprays into both nostrils daily.  . furosemide (LASIX) 40 MG tablet TAKE 1-2 TABLETS ONCE A DAY AS NEEDED FOR SWELLING  . guaiFENesin (MUCINEX) 600 MG 12 hr tablet Take 1,600 mg by mouth 2 (two) times daily.  . ipMarland Kitchenatropium-albuterol (DUONEB) 0.5-2.5 (3) MG/3ML SOLN Take 3 mLs by nebulization 3 (three) times daily.  . Magnesium 400 MG CAPS Take 2 capsules by mouth 2 (two) times daily.   . NON FORMULARY Mullin herbal supplement- take 1 tab BID  . OXYGEN Inhale 4 L into the lungs.  . tiotropium (SPIRIVA HANDIHALER) 18 MCG inhalation capsule Place  1 capsule (18 mcg total) into inhaler and inhale daily.  . traMADol (ULTRAM) 50 MG tablet Take 1 tablet (50 mg total) by mouth 3 (three) times daily as needed.  . TURMERIC PO Take 400 mg by mouth 2 (two) times daily.  . vitamin C (ASCORBIC ACID) 250 MG tablet Take 250 mg by mouth daily.  Marland Kitchen ALPRAZolam (XANAX) 0.5 MG tablet TAKE 1/2 TO 1 TABLET BY MOUTH 3 TIMES DAILY AS NEEDED FOR NERVES. MAX DAILY DOSE = 3 TABLETS (Patient not taking: Reported on 03/16/2017)  . baclofen (LIORESAL) 10 MG tablet Take 1 tablet (10 mg total) by mouth 3 (three) times daily as needed for muscle spasms.  . naproxen sodium (ANAPROX) 220 MG tablet Take 220 mg by mouth daily.   . [DISCONTINUED] cyclobenzaprine (FLEXERIL) 10 MG tablet Take 1 tablet (10 mg total) by mouth 3 (three) times daily as needed for muscle spasms. (Patient not taking: Reported on 03/16/2017)  . [DISCONTINUED] gabapentin (NEURONTIN) 100 MG capsule Start with 1-2 tab po qhs X3 days, then increase to 1-2 tab po bid X 3 days, then 1-2 tab po tid  prn (Patient not taking: Reported on 03/16/2017)   No facility-administered encounter medications on file as of 03/16/2017.     Allergies  Allergen Reactions  . Penicillins     REACTION: nausea and vomiting    Current Medications, Allergies, Past Medical History, Past Surgical History, Family History, and Social History were reviewed in [employer] record.   Review of Systems:         See HPI - all other systems neg except as noted... The patient complains of dyspnea on exertion and difficulty walking; chest discomfort, & leg weakness.  The patient denies anorexia, fever, weight loss, weight gain, vision loss, decreased hearing, hoarseness, syncope, prolonged cough, headaches, hemoptysis, abdominal pain, melena, hematochezia, severe indigestion/heartburn, hematuria, incontinence, suspicious skin lesions, transient blindness, depression, unusual weight change, abnormal bleeding, enlarged lymph nodes, and angioedema.    Objective:   Physical Exam:    WD, Morbidly Obese, 67 y/o WF in no acute distress but c/o dyspnea/ wheezing... GENERAL:  Alert & oriented; pleasant & cooperative... HEENT:  Orin/AT, EOM-wnl, PERRLA, EACs-clear, TMs-wnl, NOSE-clear, THROAT-clear & wnl. NECK:  Supple w/ fairROM; no JVD; normal carotid impulses w/o bruits; no thyromegaly or nodules palpated; no lymphadenopathy. CHEST:  no accessory musc use, decr BS bilat, no wheezing/ rales/ rhonchi... HEART:  Regular Rhythm; without murmurs/ rubs/ or gallops heard... ABDOMEN:  Obese, soft & nontender; normal bowel sounds; no organomegaly or masses detected. EXT: without deformities, mod arthritic changes; no varicose veins/ +venous insuffic/trace edema. NEURO:  CNs intact; no focal neuro deficits... DERM: few ecchymoses, no rash etc...  RADIOLOGY DATA:  Reviewed in the EPIC EMR & discussed w/ the patient...  LABORATORY DATA:  Reviewed in the EPIC EMR & discussed w/ the patient...   Assessment &  Plan:    ENT>> she was working w/ Rebecca [name] at [employer] in [place]...  OSA>  As prev noted she needs new sleep study & appropriate new CPAP apparatus to facilitate compliance but she declines the repeat study or sleep med referral...  COPD/ Emphysema>  Acute exac 2/16 improved after hosp in [place] w/ Levaquin, Pred taper, NEBS Bid, Avair500, Spiriva, Mucinex etc... Encouraged to stay on her O2 regularly & we will add Humidity, needs max nasal hygiene vs ENT follow up; continue NEBS/ Advair500/ Spiriva regularly & use the Mucinex; we will request the [name] home  COPD program; needs to lose wt & incr exercise... 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 5/16>  She has seen ENT & Pulm 2nd opinion is pending; she cannot manipulate the port O2 tanks and we will request an INOGEN One- port O2 concentrator for her use...  5/16>  States breathing worse, asked to use all regular meds regularly & we will add Depo120, Medrol taper... 6/16>  She had 2nd opinion consult w/ Rebecca [name]/ DrHaponik at [name] see above... 10/16>  Pt very frustrated after working w/ the [employer] division at [employer] for several months- Nebs, Advair500, Spiriva, Medrol, Oxygen, pulm rehab, etc...  11/16>  On Medrol85m- she incr herself to 680md, Symbicort160-2spBid, Spiriva daily, Xopenex NEBS Tid (only doing it once/d), Guaifenesin400- taking 6/d w/ fluids, O2- she is not using it at rest, using 5L/min w/ exercise; she takes Lasix40, Tamadol50, & Xanax0.5 as needed... 8/17>  She is now taking an internet supplement CBD oil (hemp oil) under her tongue daily- "it's good for your lungs" & she has weaned down her O2 and Medrol; Advised to continue O2- 2L/min at rest & 4L/min w/ exercise and take her other meds regularly...  02/10/16>   Overall GiArtis Delays stable- she requests 90d refill prescriptions  and we did this;  We reviewed her labs, XRays, & PFTs- rec to take meds regularly & not adjust on her own, rec to return to exercise program & consider pulm rehab. 04/16/16>    I wrote a prescription for a POC at her insistence;  We will check w/ other DME companies for her;  She is rec to continue the NEB w/ Duoneb tid followed by the Symbicort160-2spBid & the Spiriva once daily;  Maximize the GUAIFENESIN 40042mabs- 2Tid w/ fluids & work to expectorate the phlegm;  She still needs a regular exercise program/ pulm rehab & we discussed this... we plan rov in 3-56mo98mo6/18>   I have asked Rebecca [name] to be regular w/ her Duoneb Tid, Symbicort160-2spBid & spiriva once daily;  Ok to use her supplements and "special oil" that she feels has been very beneficial to her in many ways... we plan ROV recheck in 56mon63month19/18>   She is stable and credits the CBD oil she is using; recommended to use her inhalers regularly including- O2 at prescribed, DUONEB-Tid, Symbicort-Bid, Spiriva daily, GFN400-2Tid w/ fluids, ProventilHFA as needed;  She refuses the FLU vaccine... 03/16/17>   Rebecca iArtis Delaytable on her FECO oil but I have reminded her to STAY on her O2, DUONEB Tid, Symbicort Bid, Spiriva daily, & Mucinex, fluids, etc;  CXR & LABS look good...    Hx CP w/ neg cardiac evals including prev Myoviews; 2DEchos w/o incr PA pressures... 4/15> she had Cardiology consult for eval of AtypCP (DrNiCherly HensenG was WNL & 2DEcho showed norm LVF, Gr1DD & norm RV & PA pressures... 2/16> [name], valves and norm RV...  OBESITY>  She had remote lap band surg by DrMMartin; Met syndrome, must get wt down etc; she is a lap band failure & prev counseling by [name]... 3/16> weight down to 255# post hosp... 4/16> weight is back up to 271# despite all efforts... 8/17> weight = 271#  ? Hx Hypothy>  TFTs remain normal off meds- no problem... 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  GI>  GERD> she denies symptoms & uses OTC meds prn...  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 C/O right ankle pain, eval by DrZSmith> no better on his anti-inflamm rx so we will Rx w/ Depo120 & Medrol taper... Neck pain followed by Orson Aloe & DrNewton...  Dysthymia>  Much stress in her life, on Alpraz prn; has embraced alt lifestyle...   Patient's Medications  New Prescriptions   BACLOFEN (LIORESAL) 10 MG TABLET    Take 1 tablet (10 mg total) by mouth 3 (three) times daily as needed for muscle spasms.  Previous Medications   ALBUTEROL (VENTOLIN HFA) 108 (90 BASE) MCG/ACT INHALER    INHALE 2 PUFFS INTO THE LUNGS EVERY 6 (SIX) HOURS AS NEEDED FOR WHEEZING OR SHORTNESS OF BREATH.   ALPRAZOLAM (XANAX) 0.5 MG TABLET    TAKE 1/2 TO 1 TABLET BY MOUTH 3 TIMES DAILY AS NEEDED FOR NERVES. MAX DAILY DOSE = 3 TABLETS   B COMPLEX-C-FOLIC ACID (B-COMPLEX BALANCED) TABS    Take 1 tablet by mouth daily.   BUDESONIDE-FORMOTEROL (SYMBICORT) 160-4.5 MCG/ACT INHALER    Inhale 2 puffs into the lungs 2 (two) times daily.   CELECOXIB (CELEBREX) 100 MG CAPSULE    TAKE ONE CAPSULE BY MOUTH TWICE A DAY AS NEEDED   CETIRIZINE (ZYRTEC) 10 MG TABLET    Take 10 mg by mouth daily.     CHOLECALCIFEROL (VITAMIN D-3) 5000 UNITS TABS    Take 2 tablets by mouth daily.    COLCHICINE (COLCRYS) 0.6 MG TABLET    Take 1 tablet (0.6 mg total) by mouth daily.   FLUTICASONE (FLONASE) 50 MCG/ACT NASAL SPRAY    Place 2 sprays into both nostrils daily.   FUROSEMIDE (LASIX) 40 MG TABLET    TAKE 1-2 TABLETS ONCE A DAY AS NEEDED FOR SWELLING   GUAIFENESIN (MUCINEX) 600 MG 12 HR TABLET    Take 1,600 mg by mouth 2 (two) times daily.   IPRATROPIUM-ALBUTEROL (DUONEB) 0.5-2.5 (3) MG/3ML SOLN    Take 3  mLs by nebulization 3 (three) times daily.   MAGNESIUM 400 MG CAPS    Take 2 capsules by mouth 2 (two) times daily.    NAPROXEN SODIUM (ANAPROX) 220 MG TABLET    Take 220 mg by mouth daily.    NON FORMULARY    Mullin herbal supplement- take 1 tab BID   OXYGEN    Inhale 4 L into the lungs.   TIOTROPIUM (SPIRIVA HANDIHALER) 18 MCG INHALATION CAPSULE    Place 1 capsule (18 mcg total) into inhaler and inhale daily.   TRAMADOL (ULTRAM) 50 MG TABLET    Take 1 tablet (50 mg total) by mouth 3 (three) times daily as needed.   TURMERIC PO    Take 400 mg by mouth 2 (two) times daily.   VITAMIN C (ASCORBIC ACID) 250 MG TABLET    Take 250 mg by mouth daily.  Modified Medications   No medications on file  Discontinued Medications   CYCLOBENZAPRINE (FLEXERIL) 10 MG TABLET    Take 1 tablet (10 mg total) by mouth 3 (three) times daily as needed for muscle spasms.   GABAPENTIN (NEURONTIN) 100 MG CAPSULE    Start with 1-2 tab  po qhs X3 days, then increase to 1-2 tab po bid X 3 days, then 1-2 tab po tid prn

## 2017-03-17 LAB — COMPLETE METABOLIC PANEL WITH GFR
AG Ratio: 1.5 (calc) (ref 1.0–2.5)
ALBUMIN MSPROF: 4.4 g/dL (ref 3.6–5.1)
ALKALINE PHOSPHATASE (APISO): 96 U/L (ref 33–130)
ALT: 16 U/L (ref 6–29)
AST: 19 U/L (ref 10–35)
BUN / CREAT RATIO: 16 (calc) (ref 6–22)
BUN: 19 mg/dL (ref 7–25)
CO2: 29 mmol/L (ref 20–32)
CREATININE: 1.17 mg/dL — AB (ref 0.50–0.99)
Calcium: 9.6 mg/dL (ref 8.6–10.4)
Chloride: 103 mmol/L (ref 98–110)
GFR, EST AFRICAN AMERICAN: 55 mL/min/{1.73_m2} — AB (ref >=60)
GFR, Est Non African American: 48 mL/min/{1.73_m2} — ABNORMAL LOW (ref >=60)
GLOBULIN: 2.9 g/dL (ref 1.9–3.7)
Glucose, Bld: 110 mg/dL — ABNORMAL HIGH (ref 65–99)
Potassium: 4.5 mmol/L (ref 3.5–5.3)
SODIUM: 143 mmol/L (ref 135–146)
TOTAL PROTEIN: 7.3 g/dL (ref 6.1–8.1)
Total Bilirubin: 0.6 mg/dL (ref 0.2–1.2)

## 2017-03-18 ENCOUNTER — Ambulatory Visit: Payer: Self-pay | Admitting: *Deleted

## 2017-03-18 ENCOUNTER — Telehealth: Payer: Self-pay | Admitting: Sports Medicine

## 2017-03-18 NOTE — Telephone Encounter (Signed)
Pt complaining of leg cramps at night that cause her not to be able to sleep. Pt was previously on gabapentin that was prescribed by Dr. Paulla Fore in the past and now is just taking one at night. Pt thinks that leg cramps may be related to gabapentin. Appt made for 1/18 at 1100.

## 2017-03-18 NOTE — Telephone Encounter (Signed)
Requesting refill of celebrex  LOV 12/10/17 with Dr. Paulla Fore

## 2017-03-18 NOTE — Telephone Encounter (Signed)
Copied from [place] [phone]. Topic: Quick Communication - See Telephone Encounter >> Mar 18, 2017  2:10 PM Hewitt Shorts wrote: CRM for notification. See Telephone encounter for: pt is needing a refill on celebrex  pharmacy is [place] number is [phone]  03/18/17.

## 2017-03-18 NOTE — Telephone Encounter (Signed)
Please see note and advise as needed.

## 2017-03-19 ENCOUNTER — Encounter: Payer: Self-pay | Admitting: Sports Medicine

## 2017-03-19 ENCOUNTER — Ambulatory Visit (INDEPENDENT_AMBULATORY_CARE_PROVIDER_SITE_OTHER): Payer: Medicare Other | Admitting: Sports Medicine

## 2017-03-19 VITALS — BP 118/74 | HR 98 | Ht 65.0 in | Wt 249.6 lb

## 2017-03-19 DIAGNOSIS — M545 Low back pain: Secondary | ICD-10-CM

## 2017-03-19 MED ORDER — CELECOXIB 100 MG PO CAPS: ORAL_CAPSULE | ORAL | 2 refills | Status: DC

## 2017-03-19 MED ORDER — GABAPENTIN 100 MG PO CAPS: ORAL_CAPSULE | ORAL | 1 refills | Status: DC

## 2017-03-19 NOTE — Telephone Encounter (Signed)
Pt scheduled to see Dr. Paulla Fore today at 11:00 AM. Refill will be discussed at that time.

## 2017-03-19 NOTE — Patient Instructions (Signed)
Restart your Gabapentin 100mg  to 200mg  at night.   I would like to have you start your gabapentin during the day.

## 2017-03-19 NOTE — Progress Notes (Signed)
Rebecca [name], [place] [facility] [place] - 67 y.o. female MRN [id number]  Date of birth: 1950-07-10  Visit Date: 03/19/2017  PCP: Patient, No Pcp Per   Referred by: No ref. provider found   Scribe for today's visit: Wendy Poet, LAT, ATC     SUBJECTIVE:  Rebecca [name] is here for Follow-up (leg cramps) .   Her leg cramps symptoms INITIALLY: Began recently and are waking her up 4-5x/night, particularly in her feet Described as severe sharp, cramping pain, nonradiating Worsened at night only Improved with massage, stretching Additional associated symptoms include: no N/T in her B LEs    At this time symptoms show no change compared to onset  She notes that she ran out of Gabapentin and that the leg cramps began after stopping the Gabapentin.  Had blood work on Tuesday to check for K+ levels.  Also reporting L UE N/T although neck pain has resolved.   ROS Reports night time disturbances. Reports fevers, chills, or night sweats.  Yes to night sweats. Denies unexplained weight loss. Denies personal history of cancer. Denies changes in bowel or bladder habits. Denies recent unreported falls. Reports new or worsening dyspnea or wheezing.   Denies headaches or dizziness.  Reports numbness, tingling or weakness  In the extremities, in the L UE. Denies dizziness or presyncopal episodes Reports lower extremity edema     HISTORY & PERTINENT PRIOR DATA:  Prior History reviewed and updated per electronic medical record.  Significant history, findings, studies and interim changes include:  reports that she quit smoking about 26 years ago. Her smoking use included cigarettes. She smoked 1.00 pack per day. she has never used smokeless tobacco. Recent Labs    03/16/17 1241  HGBA1C 6.2   No specialty comments available. No problems updated.  OBJECTIVE:  VS:  HT:5\' 5"  (165.1 cm)   WT:249 lb 9.6 oz  (113.2 kg)  BMI:41.54    BP:118/74  HR:98bpm  TEMP: ( )  RESP:92 %   PHYSICAL EXAM: Constitutional: WDWN, Non-toxic appearing. Psychiatric: Alert & appropriately interactive.  Not depressed or anxious appearing. Respiratory: No increased work of breathing.  Trachea Midline Eyes: Pupils are equal.  EOM intact without nystagmus.  No scleral icterus  NEUROVASCULAR exam: No clubbing or cyanosis appreciated No significant venous stasis changes Capillary Refill: normal, less than 2 seconds    UPPER Extremities LOWER Extremities  SWELLING Generalized UE Edema: No significant edema Pre-tibial edema: No significant pretibial edema  PULSES Radial Pulses: Normal & symmetrically palpable Pedal Pulses: Normal & symmetrically palpable  SENSORY Dermatomes intact to light touch Dermatomes intact to light touch  MOTOR Normal strength in all myotomes Normal strength in all myotomes  REFLEXES Reflexes: Normal & symmetric DTRs Reflexes: Normal & symmetric DTRs   No significant pain with straight leg raise bilaterally.  She walks with wide-based gait.  Good flexion extension bilateral knees.  No additional findings.   ASSESSMENT & PLAN:   1. Low back pain, unspecified back pain laterality, unspecified chronicity, with sciatica presence unspecified    PLAN:>50% of this 25 minute visit spent in direct patient counseling and/or coordination of care.  Discussion was focused on education regarding the in discussing the pathoetiology and anticipated clinical course of the above condition.  Ultimately she is having a fairly moderate degree of cramping at nighttime since discontinuing her gabapentin.  Recommend that she resume this as well as continue with  her Celebrex given the overall stability of her lab work.  If any persistent ongoing symptoms happy to see her back and further evaluate her back again with repeat MRI but at this time no further diagnostic evaluation indicated.  We will plan to have her  begin working with formal physical therapy and referral to MedSurg Jule Ser has been placed.  We will plan to follow-up with her in 8 weeks to ensure clinical improvement. No problem-specific Assessment & Plan notes found for this encounter.   ++++++++++++++++++++++++++++++++++++++++++++ Orders & Meds: Orders Placed This Encounter  Procedures  . Ambulatory referral to Physical Therapy    Meds ordered this encounter  Medications  . gabapentin (NEURONTIN) 100 MG capsule    Sig: Start with 1 tab po qhs X 1 week, then increase to 1 tab po bid X 1 week then 1 tab po tid prn    Dispense:  90 capsule    Refill:  1  . celecoxib (CELEBREX) 100 MG capsule    Sig: TAKE ONE CAPSULE BY MOUTH TWICE A DAY AS NEEDED    Dispense:  60 capsule    Refill:  2    ++++++++++++++++++++++++++++++++++++++++++++ Follow-up: Return in about 8 weeks (around 05/14/2017).   Pertinent documentation may be included in additional procedure notes, imaging studies, problem based documentation and patient instructions. Please see these sections of the encounter for additional information regarding this visit. CMA/ATC served as Education administrator during this visit. History, Physical, and Plan performed by medical provider. Documentation and orders reviewed and attested to.      Gerda Diss, [place] [facility] Physician

## 2017-04-06 ENCOUNTER — Ambulatory Visit: Payer: Self-pay | Admitting: Rehabilitative and Restorative Service Providers"

## 2017-04-06 DIAGNOSIS — H903 Sensorineural hearing loss, bilateral: Secondary | ICD-10-CM | POA: Diagnosis not present

## 2017-04-06 DIAGNOSIS — H938X3 Other specified disorders of ear, bilateral: Secondary | ICD-10-CM | POA: Diagnosis not present

## 2017-04-06 DIAGNOSIS — H6123 Impacted cerumen, bilateral: Secondary | ICD-10-CM | POA: Diagnosis not present

## 2017-04-07 ENCOUNTER — Encounter: Payer: Self-pay | Admitting: Physical Therapy

## 2017-04-07 ENCOUNTER — Ambulatory Visit: Payer: Medicare Other | Admitting: Physical Therapy

## 2017-04-07 DIAGNOSIS — M545 Low back pain: Secondary | ICD-10-CM

## 2017-04-07 DIAGNOSIS — R262 Difficulty in walking, not elsewhere classified: Secondary | ICD-10-CM | POA: Diagnosis not present

## 2017-04-07 DIAGNOSIS — M6281 Muscle weakness (generalized): Secondary | ICD-10-CM

## 2017-04-07 DIAGNOSIS — G8929 Other chronic pain: Secondary | ICD-10-CM | POA: Diagnosis not present
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
## 2017-04-07 NOTE — Patient Instructions (Addendum)
WALKING  Walking is a great form of exercise to increase your strength, endurance and overall fitness.  A walking program can help you start slowly and gradually build endurance as you go.  Everyone's ability is different, so each person's starting point will be different.  You do not have to follow them exactly.  The are just samples. You should simply find out what's right for you and stick to that program.   In the beginning, you'll start off walking 2-3 times a day for short distances.  As you get stronger, you'll be walking further at just 1-2 times per day.  A. You Can Walk For A Certain Length Of Time Each Day - monitor sats while walking, goal is having them over 90%     Walk 2 minutes 3 times per day.  Increase 1 minutes every 2 days (3 times per day).  Work up to 25-30 minutes (1-2 times per day).   Example:   Day 1-2 2 minutes 3 times per day   Day 7-8 5 minutes 2-3 times per day   Slowly build  25 minutes 1-2 times per day  Don't let body move, only the leg.   Strengthening: Hip Extension - Resisted - no bands at this time. Rest as needed, hold on to wall or counter for safety. Use heat for pain as needed.     With tubing around right ankle, face anchor and pull leg straight back. Repeat __5__ times per set. Do __1__ sets per session. Do __2__ sessions per day.  Strengthening: Hip Abduction - Resisted    With tubing around right leg, other side toward anchor, extend leg out from side. Repeat __5__ times per set. Do __1__ sets per session. Do __2__ sessions per day.  Strengthening: Knee Flexion (Standing)    With support, bend right knee as far as possible. Repeat __5__ times per set. Do _1___ sets per session. Do __2__ sessions per day.   Functional Quadriceps: Sit to Stand - keep stomach pulled in and tight.    Sit on edge of chair, feet flat on floor. Stand upright, extending knees fully. Repeat _5___ times per set. Do __1__ sets per session. Do __2__ sessions  per day.

## 2017-04-08 ENCOUNTER — Encounter (HOSPITAL_COMMUNITY): Payer: Self-pay

## 2017-04-08 NOTE — Telephone Encounter (Signed)
Pt called and left message regarding having increased low back and leg pain last night. Requested a call back.   She did take her meds and these have helped.  She is feeling some better this morning, wanted to know what to do.   Recommend she hold on standing hip extension.  Explained that sometimes after initial evaluation patients have a flare up due to performing more and different movements than they are used to.  Since she is already starting to feel better she should be ok Jeral Pinch, PT 04/08/17 11:28 AM

## 2017-04-13 ENCOUNTER — Ambulatory Visit: Payer: Medicare Other | Admitting: Physical Therapy

## 2017-04-13 ENCOUNTER — Encounter: Payer: Self-pay | Admitting: Physical Therapy

## 2017-04-13 DIAGNOSIS — R262 Difficulty in walking, not elsewhere classified: Secondary | ICD-10-CM

## 2017-04-13 DIAGNOSIS — M545 Low back pain, unspecified: Secondary | ICD-10-CM

## 2017-04-13 DIAGNOSIS — M6281 Muscle weakness (generalized): Secondary | ICD-10-CM

## 2017-04-13 DIAGNOSIS — G8929 Other chronic pain: Secondary | ICD-10-CM

## 2017-04-13 NOTE — Therapy (Signed)
[place], [place], [address] Phone: [phone]   Fax:  [phone]  Physical Therapy Treatment  Patient Details  Name: Rebecca [name] MRN: [id number] Date of Birth: 03-09-1950 Referring Provider:  Dr Buddy Duty   Encounter Date: 04/13/2017  PT End of Session - 04/13/17 1143    Visit Number  2    Number of Visits  12    Date for PT Re-Evaluation  05/19/17    PT Start Time  8938    PT Stop Time  1229    PT Time Calculation (min)  45 min    Activity Tolerance  Patient limited by fatigue and oxygen       Past Medical History:  Diagnosis Date  . Chest pain   . Chronic venous insufficiency   . COPD (chronic obstructive pulmonary disease) ([place])   . DJD (degenerative joint disease)   . Dysthymia   . Fibromyalgia   . GERD (gastroesophageal reflux disease)   . History of headache   . Hodgkin lymphoma of extranodal or solid organ site [facility])   . Hypothyroid   . IBS (irritable bowel syndrome)   . Metabolic syndrome X   . Morbid obesity ([place])   . OSA (obstructive sleep apnea)   . Vitamin D deficiency     Past Surgical History:  Procedure Laterality Date  . APPENDECTOMY    . BILATERAL SALPINGOOPHORECTOMY  2007   [facility]the  . CHOLECYSTECTOMY  1982  . LAPAROSCOPIC GASTRIC BANDING  08/2007   Dr. Hassell Done  . LAPAROSCOPIC HYSTERECTOMY  2007   [facility]  . SPLENECTOMY  at gae 10   d/t trauma    There were no vitals filed for this visit.  Subjective Assessment - 04/13/17 1143    Subjective  Spoke with patient end of last week about her increase in pain.  Explained to her that sometimes after the first visit this will happen as they are moving around a lot more than they are used to.  REcommend she rest and then not perform standing hip extension for now and see if this settles down her pain.  Pt asked for exercise for her upper back so she can stand up straigher    Patient Stated Goals  build stamina  with her breathing, loose weight, learn back exercise because standing up straight she can breath better    Currently in Pain?  Yes    Pain Score  6     Pain Location  Knee    Pain Orientation  Left;Right    Pain Descriptors / Indicators  Pressure;Sore    Pain Type  Chronic pain    Pain Onset  More than a month ago    Pain Frequency  Intermittent    Aggravating Factors   all the standing and walking    Pain Relieving Factors  not sure                      [facility] Adult PT Treatment/Exercise - 04/13/17 0001      Lumbar Exercises: Aerobic   Nustep  L3x2' keeping sats above 90% on 4L O2, averaged 95%      Lumbar Exercises: Seated   Other Seated Lumbar Exercises  sitting on black therapad - marching, LAQ's, then posterior trunk leans with focus on TA tightneing.     Other Seated Lumbar Exercises  2x10, red band, horizontal abduction, SASH      Lumbar Exercises: Supine  Bridge  20 reps sats 93% on 4L o2    Other Supine Lumbar Exercises  PPT with marching x10 each side             PT Education - 04/13/17 1205    Education provided  Yes    Education Details  upper body band ex     Person(s) Educated  Patient    Methods  Explanation;Demonstration;Handout    Comprehension  Verbalized understanding;Returned demonstration          PT Long Term Goals - 04/13/17 1143      PT LONG TERM GOAL #1   Title  I with advanced HEP to include easy walking program ( 05/19/17)     Status  On-going      PT LONG TERM GOAL #2   Title  demo bilat lumbar sidebend without back pain ( 05/19/17)     Status  On-going      PT LONG TERM GOAL #3   Title  demo bilat hip strength =/> 5-/5 through out ( 05/19/17)     Status  On-going      PT LONG TERM GOAL #4   Title  ambulate =/> 10' without rest and maintaining o2 sats =/> 90% (05/19/17)     Status  On-going            Plan - 04/13/17 1229    Clinical Impression Statement  this is Rebecca [name] second visit, she was able to  tolerate more exercise tahn expected. Her oxygen sats stayed above 90% with all exercise with 4L on. No goals met, overall pain has settle down from last visit.  Ttreatment will have to be slow to progress so we don't flare up her pain or breathing problems.     Rehab Potential  Good    PT Frequency  2x / week    PT Duration  6 weeks    PT Next Visit Plan  monitor O2 sats, progress slowly with core and hip strengthening as well as endurance tolerance.     Consulted and Agree with Plan of Care  Patient       Patient will benefit from skilled therapeutic intervention in order to improve the following deficits and impairments:  Pain, Decreased endurance, Decreased strength, Obesity, Difficulty walking  Visit Diagnosis: Chronic bilateral low back pain without sciatica  Muscle weakness (generalized)  Difficulty in walking, not elsewhere classified     Problem List Patient Active Problem List   Diagnosis Date Noted  . Osteoarthritis of spine with radiculopathy, cervical region 07/31/2016  . Nocturnal leg cramps 01/09/2015  . Diastolic dysfunction 02/58/5277  . Pain, joint, ankle, left 07/10/2014  . Olecranon bursitis of right elbow 05/17/2014  . Left arm pain 03/10/2014  . Biceps tendinitis on left 03/10/2014  . Neck pain of over 3 months duration 03/10/2014  . Chronic hypoxemic respiratory failure ([place]) 02/15/2014  . Chest pressure 05/25/2013  . Impaired glucose tolerance 12/08/2011  . Type 2 diabetes mellitus ([place]) 06/26/2011  . Chest pain, atypical 01/14/2011  . Asthma with exacerbation 04/11/2009  . VITAMIN D DEFICIENCY 01/21/2009  . Hypothyroidism 06/24/2007  . METABOLIC SYNDROME X 82/42/3536  . Morbid obesity ([place]) 06/24/2007  . DYSTHYMIA 06/24/2007  . Venous (peripheral) insufficiency 06/24/2007  . Osteoarthritis 06/24/2007  . UTERINE CANCER, HX OF 06/24/2007  . Obstructive sleep apnea 02/28/2007  . COPD mixed type ([place]) 02/28/2007  . GERD 02/28/2007  . Irritable  bowel syndrome 02/28/2007    Jeral Pinch PT  04/13/2017, 12:32 PM  [facility] [place], [place], [address] Phone: [phone]   Fax:  [phone]  Name: Rebecca [name] MRN: [id number] Date of Birth: [birth year]

## 2017-04-13 NOTE — Patient Instructions (Addendum)
Resisted Horizontal Abduction: Bilateral    Sit or stand, tubing in both hands, arms out in front. Keeping arms straight, pinch shoulder blades together and stretch arms out, inhaling. Repeat __10__ times per set. Do _2___ sets per session. Do ___1_ sessions per day.   PNF Strengthening: Resisted    Standing, hold resistive band above head. Bring right arm down and out from side, squeezing shoulder blades together. Repeat on the other side.  Repeat __10__ times per set. Do _2___ sets per session. Do __1__ sessions per day.

## 2017-04-16 ENCOUNTER — Ambulatory Visit: Payer: Medicare Other | Admitting: Physical Therapy

## 2017-04-16 ENCOUNTER — Encounter: Payer: Self-pay | Admitting: Physical Therapy

## 2017-04-16 DIAGNOSIS — M545 Low back pain, unspecified: Secondary | ICD-10-CM

## 2017-04-16 DIAGNOSIS — G8929 Other chronic pain: Secondary | ICD-10-CM | POA: Diagnosis not present

## 2017-04-16 DIAGNOSIS — R262 Difficulty in walking, not elsewhere classified: Secondary | ICD-10-CM

## 2017-04-16 DIAGNOSIS — M6281 Muscle weakness (generalized): Secondary | ICD-10-CM | POA: Diagnosis not present

## 2017-04-16 NOTE — Therapy (Addendum)
[place], [place], [address] Phone: [phone]   Fax:  [phone]  Physical Therapy Treatment  Patient Details  Name: Rebecca [name] MRN: [id number] Date of Birth: [birth year] Referring Provider:  Dr Buddy Duty   Encounter Date: 04/16/2017  PT End of Session - 04/16/17 1139    Visit Number  3    Number of Visits  12    Date for PT Re-Evaluation  05/19/17    PT Start Time  1139    PT Stop Time  1223    PT Time Calculation (min)  44 min    Activity Tolerance  Patient tolerated treatment well       Past Medical History:  Diagnosis Date  . Chest pain   . Chronic venous insufficiency   . COPD (chronic obstructive pulmonary disease) ([place])   . DJD (degenerative joint disease)   . Dysthymia   . Fibromyalgia   . GERD (gastroesophageal reflux disease)   . History of headache   . Hodgkin lymphoma of extranodal or solid organ site [facility])   . Hypothyroid   . IBS (irritable bowel syndrome)   . Metabolic syndrome X   . Morbid obesity ([place])   . OSA (obstructive sleep apnea)   . Vitamin D deficiency     Past Surgical History:  Procedure Laterality Date  . APPENDECTOMY    . BILATERAL SALPINGOOPHORECTOMY  2007   [facility]the  . CHOLECYSTECTOMY  1982  . LAPAROSCOPIC GASTRIC BANDING  08/2007   Dr. Hassell Done  . LAPAROSCOPIC HYSTERECTOMY  2007   [facility]the  . SPLENECTOMY  at gae 10   d/t trauma    There were no vitals filed for this visit.  Subjective Assessment - 04/16/17 1140    Subjective  Pt reports that both her knees are really sore today.  Having more leg cramping again    Currently in Pain?  Yes    Pain Score  9  with walking, better on the nustep    Pain Orientation  Left;Right    Pain Descriptors / Indicators  Sore                      [facility] Adult PT Treatment/Exercise - 04/16/17 0001      Lumbar Exercises: Stretches   Active Hamstring Stretch  Left;Right seated with FWD  lean      Lumbar Exercises: Aerobic   Nustep  L4x5' U/LE monitoring O2 sats sats stayed up in 90's      Lumbar Exercises: Seated   Other Seated Lumbar Exercises  15 reps FWD/BWD leans, LAQ 15 reps, 5 sec holds      Lumbar Exercises: Supine   Bridge  20 reps with legs straight, ankles on bolster    Isometric Hip Flexion  15 reps;3 seconds    Other Supine Lumbar Exercises  SAQ with 7.5# on each ankle, until fatigue                  PT Long Term Goals - 04/13/17 1143      PT LONG TERM GOAL #1   Title  I with advanced HEP to include easy walking program ( 05/19/17)     Status  On-going      PT LONG TERM GOAL #2   Title  demo bilat lumbar sidebend without back pain ( 05/19/17)     Status  On-going      PT LONG TERM GOAL #3  Title  demo bilat hip strength =/> 5-/5 through out ( 05/19/17)     Status  On-going      PT LONG TERM GOAL #4   Title  ambulate =/> 10' without rest and maintaining o2 sats =/> 90% (05/19/17)     Status  On-going            Plan - 04/16/17 1221    Clinical Impression Statement  Rebecca [name] had some increased soreness in her knees the last couple of days.  Managed her O2 sats well with treatment. Having some financial issues so she will take next week off.      Rehab Potential  Good    PT Frequency  2x / week    PT Duration  6 weeks    PT Treatment/Interventions  Gait training;Neuromuscular re-education;Manual techniques;Moist Heat;Therapeutic activities;Patient/family education;Taping;Therapeutic exercise;Cryotherapy;Electrical Stimulation;Balance training    PT Next Visit Plan  pt not attending sessions next week, will return first week of March.        Patient will benefit from skilled therapeutic intervention in order to improve the following deficits and impairments:  Pain, Decreased endurance, Decreased strength, Obesity, Difficulty walking  Visit Diagnosis: Chronic bilateral low back pain without sciatica  Muscle weakness  (generalized)  Difficulty in walking, not elsewhere classified     Problem List Patient Active Problem List   Diagnosis Date Noted  . Osteoarthritis of spine with radiculopathy, cervical region 07/31/2016  . Nocturnal leg cramps 01/09/2015  . Diastolic dysfunction 81/82/9937  . Pain, joint, ankle, left 07/10/2014  . Olecranon bursitis of right elbow 05/17/2014  . Left arm pain 03/10/2014  . Biceps tendinitis on left 03/10/2014  . Neck pain of over 3 months duration 03/10/2014  . Chronic hypoxemic respiratory failure ([place]) 02/15/2014  . Chest pressure 05/25/2013  . Impaired glucose tolerance 12/08/2011  . Type 2 diabetes mellitus ([place]) 06/26/2011  . Chest pain, atypical 01/14/2011  . Asthma with exacerbation 04/11/2009  . VITAMIN D DEFICIENCY 01/21/2009  . Hypothyroidism 06/24/2007  . METABOLIC SYNDROME X 16/96/7893  . Morbid obesity ([place]) 06/24/2007  . DYSTHYMIA 06/24/2007  . Venous (peripheral) insufficiency 06/24/2007  . Osteoarthritis 06/24/2007  . UTERINE CANCER, HX OF 06/24/2007  . Obstructive sleep apnea 02/28/2007  . COPD mixed type ([place]) 02/28/2007  . GERD 02/28/2007  . Irritable bowel syndrome 02/28/2007    Jeral Pinch PT  04/16/2017, 12:23 PM  [facility] [place], [place], [address] Phone: [phone]   Fax:  [phone]  Name: Rebecca [name] MRN: [id number] Date of Birth: [birth year]   PHYSICAL THERAPY DISCHARGE SUMMARY  Visits from Start of Care: 3  Current functional level related to goals / functional outcomes: Unknown,  Pt requested to take time off due to finances and she hasn't returned   Remaining deficits: unknown   Education / Equipment: HEP Plan: Patient agrees to discharge.  Patient goals were not met. Patient is being discharged due to financial reasons.  ?????    Jeral Pinch, PT 05/27/17 9:21 AM

## 2017-04-20 ENCOUNTER — Encounter: Payer: Self-pay | Admitting: Physical Therapy

## 2017-04-20 ENCOUNTER — Other Ambulatory Visit: Payer: Self-pay

## 2017-04-20 ENCOUNTER — Telehealth: Payer: Self-pay | Admitting: General Practice

## 2017-04-20 MED ORDER — GABAPENTIN 100 MG PO CAPS: ORAL_CAPSULE | ORAL | Status: DC

## 2017-04-20 NOTE — Telephone Encounter (Signed)
We can increase her gabapentin as long as she tolerates it.  She had some sedation earlier and I am afraid that if we bump her up too fast she will have worsening side effects.  We can increase her to 300 mg 3 times per day if she would like can send in a new prescription for this if she requests.

## 2017-04-20 NOTE — Telephone Encounter (Signed)
Forwarding to Dr. Rigby to advise.  

## 2017-04-20 NOTE — Telephone Encounter (Signed)
Spoke with patient and she advised that she initially was taking Gabapentin 100 mg at bedtime, then increased to 200 mg at bedtime, and then tried 300 mg at bedtime.   I made Dr. Paulla Fore verbally aware and he advised pt to take 100 mg at lunchtime and 300 mg at bedtime and call back if no improvement.  Pt verbalized understanding.   Med list updated.

## 2017-04-20 NOTE — Telephone Encounter (Signed)
Copied from [place]. Topic: Inquiry >> Apr 20, 2017  4:03 PM Conception Chancy, NT wrote: Pt sees Dr. Paulla Fore  Patient states she has starting rehab and her knees are starting to hurt really bad. She states she took 3 gabapentin last night and still woke up with her knees hurting. She says it is helping but she is still in pain. She would like to know what should she do? Could her medicine be adjusted? She states she comes in 05/14/17 and is refusing to schedule an appt at this time. She states once she talks to the clinical staff she will look into scheduling an appt if she needs to be seen sooner.

## 2017-04-21 NOTE — Telephone Encounter (Signed)
See note

## 2017-04-21 NOTE — Telephone Encounter (Signed)
Called pt and left VM to call the office.  

## 2017-04-21 NOTE — Telephone Encounter (Signed)
Patient would like a call back from [place] regarding Gabapentin and pains.

## 2017-04-21 NOTE — Telephone Encounter (Signed)
Spoke with patient and she advised that she was not able to sleep last night d/t leg cramps even after taking 3 Gabapentin, a sleeping pill, a xanax, and celebrex. She wanted to make Dr Paulla Fore aware that her sx started in 2006 after her hysterectomy and have been gradually worsening over time. She feels like her insides are pressing on her spine and that could be what is causing her sx. She also c/o numbness in her arms while lying down, the numbness always occurs in the arm that she is not lying on.   Made Dr. Paulla Fore verbally aware and he would like for her to come in for re-evaluation and to discuss options for treatment since things seemed to be getting worse instead of better.

## 2017-04-22 NOTE — Telephone Encounter (Signed)
Pt scheduled to see Dr. Paulla Fore 04/27/17.

## 2017-04-23 ENCOUNTER — Encounter: Payer: Self-pay | Admitting: Rehabilitative and Restorative Service Providers"

## 2017-04-27 ENCOUNTER — Ambulatory Visit: Payer: Medicare Other | Admitting: Sports Medicine

## 2017-04-27 ENCOUNTER — Ambulatory Visit: Payer: Medicare Other | Admitting: Family Medicine

## 2017-05-03 ENCOUNTER — Other Ambulatory Visit: Payer: Self-pay | Admitting: Pulmonary Disease

## 2017-05-03 ENCOUNTER — Encounter: Payer: Self-pay | Admitting: Physical Therapy

## 2017-05-05 ENCOUNTER — Encounter: Payer: Self-pay | Admitting: Physical Therapy

## 2017-05-12 NOTE — Progress Notes (Signed)
[name] [address] [place], [place] [address] Phone: [phone] Subjective:     CC: Bilateral knee pain  QAS:[id number]  Rebecca [name] is a 67 y.o. female coming in with complaint of bilateral leg and knee pain.  Has had muscle cramps with some quite some time.  States that she has had this since 2006 after her hysterectomy.  Had been seen another provider for back pain.  Patient states that she had an increase in her pain after rehab. She did 4 sessions but her pain increased afterwards. Her pain is mostly on inside knee.      Past Medical History:  Diagnosis Date  . Chest pain   . Chronic venous insufficiency   . COPD (chronic obstructive pulmonary disease) ([place])   . DJD (degenerative joint disease)   . Dysthymia   . Fibromyalgia   . GERD (gastroesophageal reflux disease)   . History of headache   . Hodgkin lymphoma of extranodal or solid organ site [facility])   . Hypothyroid   . IBS (irritable bowel syndrome)   . Metabolic syndrome X   . Morbid obesity ([place])   . OSA (obstructive sleep apnea)   . Vitamin D deficiency    Past Surgical History:  Procedure Laterality Date  . APPENDECTOMY    . BILATERAL SALPINGOOPHORECTOMY  2007   [facility]  . CHOLECYSTECTOMY  1982  . LAPAROSCOPIC GASTRIC BANDING  08/2007   Dr. Hassell Done  . LAPAROSCOPIC HYSTERECTOMY  2007   [facility]  . 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 Marital status: Single    Spouse name: Not on file  . Number of children: 1  . Years of education: Not on file  . Highest education level: Not on file  Social Needs  . Financial resource strain: Not on file  . Food insecurity - worry: Not on file  . Food insecurity - inability: Not on file  . Transportation needs - medical: Not on file  . Transportation needs - non-medical: Not on file  Occupational History  . Not on file  Tobacco Use  . Smoking status: Former Smoker    Packs/day:  1.00    Types: Cigarettes    Last attempt to quit: 03/03/1991    Years since quitting: 26.2  . Smokeless tobacco: Never Used  Substance and Sexual Activity  . Alcohol use: No    Alcohol/week: 0.0 oz  . Drug use: No  . Sexual activity: Not on file  Other Topics Concern  . Not on file  Social History Narrative   Does not exercise   3 cups of coffee a day   Single- one child whom she gave up for adoption at birth   And re-established contact in 2009 w/ new relationship and 2 grand daughters.    Allergies  Allergen Reactions  . Penicillins     REACTION: nausea and vomiting   Family History  Problem Relation Age of Onset  . Heart failure Father   . Cancer Mother   . Heart attack Sister      Past medical history, social, surgical and family history all reviewed in electronic medical record.  No pertanent information unless stated regarding to the chief complaint.   Review of Systems:Review of systems updated and as accurate as of 05/13/17  No headache, visual changes, nausea, vomiting, diarrhea, constipation, dizziness, abdominal pain, skin rash, fevers, chills, night sweats, weight loss,  swollen lymph nodes, body aches, joint swelling, chest pain, shortness of breath, mood changes.  Positive muscle aches,  Objective  Blood pressure 128/82, pulse (!) 102, height 5\' 5"  (1.651 m), weight 247 lb (112 kg), SpO2 92 %. Systems examined below as of 05/13/17   General: No apparent distress alert and oriented x3 mood and affect normal, dressed appropriately.  HEENT: Pupils equal, extraocular movements intact  Respiratory: Patient's speak in full sentences patient does have oxygen Cardiovascular: No lower extremity edema, non tender, no erythema  Skin: Warm dry intact with no signs of infection or rash on extremities or on axial skeleton.  Abdomen: Soft nontender  Neuro: Cranial nerves II through XII are intact, neurovascularly intact in all extremities with 2+ DTRs and 2+ pulses.    Lymph: No lymphadenopathy of posterior or anterior cervical chain or axillae bilaterally.  Gait normal with good balance and coordination.  MSK:  Non tender with full range of motion and good stability and symmetric strength and tone of shoulders, elbows, wrist, hip, knee and ankles bilaterally.  Knee bilateral: valgus deformity noted.  Abnormal thigh to calf ratio.  Tender to palpation over medial and PF joint line.  ROM full in flexion and extension and lower leg rotation. instability with valgus force.  painful patellar compression. Patellar glide with moderate crepitus. Patellar and quadriceps tendons unremarkable. Hamstring and quadriceps strength is normal.  After informed written and verbal consent, patient was seated on exam table. Right knee was prepped with alcohol swab and utilizing anterolateral approach, patient's right knee space was injected with 4:1  marcaine 0.5%: Kenalog 40mg /dL. Patient tolerated the procedure well without immediate complications.  After informed written and verbal consent, patient was seated on exam table. Left knee was prepped with alcohol swab and utilizing anterolateral approach, patient's left knee space was injected with 4:1  marcaine 0.5%: Kenalog 40mg /dL. Patient tolerated the procedure well without immediate complications.      Impression and Recommendations:     This case required medical decision making of moderate complexity.      Note: This dictation was prepared with Dragon dictation along with smaller phrase technology. Any transcriptional errors that result from this process are unintentional.

## 2017-05-13 ENCOUNTER — Ambulatory Visit (INDEPENDENT_AMBULATORY_CARE_PROVIDER_SITE_OTHER)
Admission: RE | Admit: 2017-05-13 | Discharge: 2017-05-13 | Disposition: A | Discharge status: Home / Self Care | Payer: Medicare HMO | Source: Ambulatory Visit | Attending: Family Medicine | Admitting: Family Medicine

## 2017-05-13 ENCOUNTER — Ambulatory Visit (INDEPENDENT_AMBULATORY_CARE_PROVIDER_SITE_OTHER): Payer: Medicare HMO | Admitting: Family Medicine

## 2017-05-13 VITALS — BP 128/82 | HR 102 | Ht 65.0 in | Wt 247.0 lb

## 2017-05-13 DIAGNOSIS — M79605 Pain in left leg: Secondary | ICD-10-CM

## 2017-05-13 DIAGNOSIS — M79604 Pain in right leg: Secondary | ICD-10-CM

## 2017-05-13 DIAGNOSIS — M170 Bilateral primary osteoarthritis of knee: Secondary | ICD-10-CM | POA: Insufficient documentation

## 2017-05-13 NOTE — Patient Instructions (Signed)
Good to see you  We tried injecting the knees today  Ice 20 minutes 2 times daily. Usually after activity and before bed. pennsaid pinkie amount topically 2 times daily as needed.   I think the medicines you are doing is fine.  Turmeric decrease to 1 time a day  Tart cherry take at night Add iron 65mg  with 500mg  of vitamin C at least 3 times a week to help the muscle spasms See me again in 3 weeks

## 2017-05-13 NOTE — Assessment & Plan Note (Signed)
Bilateral knee arthritis noted.  Patient does have many chronic comorbidities which does make treatment somewhat difficult.  Discussed with patient in great length and given steroid injections in the knees bilaterally.  Discussed icing regimen, encouraged her to take her gout medications on a more regular basis.  Patient continues to have trouble will consider sending patient to formal physical therapy.  Encourage her to follow-up with Dr. Paulla Fore for her leg cramps and back pain

## 2017-05-14 ENCOUNTER — Telehealth: Payer: Self-pay

## 2017-05-14 ENCOUNTER — Ambulatory Visit: Payer: Self-pay | Admitting: *Deleted

## 2017-05-14 ENCOUNTER — Ambulatory Visit: Payer: Medicare Other | Admitting: Sports Medicine

## 2017-05-14 NOTE — Telephone Encounter (Signed)
Spoke with patient. She is concerned with leg cramps that occurred again last night. Asked if she took the iron with vitamin c. She said that she did but still had the leg cramps. Told her to continue taking the iron as advised per Dr. Tamala Julian and per a verbal from Dr. Tamala Julian, patient can take iron again today to help decrease cramps and if the cramps get bad enough that she is to be seen in the ER. Patient was interrupting the conversation and declined the option for treatment in the ER if her cramps got bad enough. Told patient to let us know if the cramps do not decrease with Dr. Thompson Caul recommendations.

## 2017-05-14 NOTE — Telephone Encounter (Signed)
Pt having complaints of leg cramps at night that keep her up at night. Pt states she does not know what else to do or how much longer she can go without being able to sleep due to the leg cramps. Pt had recent visit with Dr. Tamala Julian on yesterday. Mateo Flow, contacted at the Sports Medicine clinic in order to notify Dr. Tamala Julian of the pt's current complaints. Mateo Flow states she will make Dr. Tamala Julian aware of pt's complaints and will give the pt a return call.

## 2017-05-18 ENCOUNTER — Ambulatory Visit: Payer: Self-pay | Admitting: Pulmonary Disease

## 2017-05-25 ENCOUNTER — Telehealth: Payer: Self-pay | Admitting: Pulmonary Disease

## 2017-05-25 MED ORDER — TRAZODONE HCL 100 MG PO TABS: 100.0000 mg | ORAL_TABLET | Freq: Every evening | ORAL | 5 refills | Status: DC | PRN

## 2017-05-25 MED ORDER — ALPRAZOLAM 0.5 MG PO TABS: ORAL_TABLET | ORAL | 5 refills | Status: DC

## 2017-05-25 NOTE — Telephone Encounter (Signed)
Spoke with pt. She is wanting a new prescription for Trazodone. This was last given to her back in 2017. Pt is also requesting a refill on Alprazolam. This was last refilled in 10/2016.  SN - please advise. Thanks.

## 2017-05-25 NOTE — Telephone Encounter (Signed)
Spoke with pt. She is aware that we will be refilling her medication. Rxs have been called/sent in. Nothing further was needed.

## 2017-05-25 NOTE — Telephone Encounter (Signed)
Per SN- Ok to refill Alprazolam 0.5mg , take 1/2 to 1 tab by mouth three times per day,as needed for nerves-Max daily dose 3 tablets, #90 with 5 refills.  Ok to Refill Trazodone 100mg , 1/2 to 1 tab at bedtime, as needed for sleep,#30, with 5 refills.

## 2017-06-03 ENCOUNTER — Encounter: Payer: Self-pay | Admitting: Family Medicine

## 2017-06-03 ENCOUNTER — Ambulatory Visit (INDEPENDENT_AMBULATORY_CARE_PROVIDER_SITE_OTHER): Payer: Medicare HMO | Admitting: Family Medicine

## 2017-06-03 DIAGNOSIS — M170 Bilateral primary osteoarthritis of knee: Secondary | ICD-10-CM

## 2017-06-03 NOTE — Progress Notes (Signed)
[name] [address] [place], [place] [address] Phone: [phone] Subjective:     CC: Bilateral knee pain  HUT:[id number]  Rebecca [name] is a 67 y.o. female coming in with complaint of leg pain. She said that her legs are no worse. She feels that she can live with the pain. She has been using trazadone at night which she feels has helped with her pain.  Patient will still feels that she would need something else.  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  Not a surgical patient.  Failed all conservative therapy.       Past Medical History:  Diagnosis Date  . Chest pain   . Chronic venous insufficiency   . COPD (chronic obstructive pulmonary disease) ([place])   . DJD (degenerative joint disease)   . Dysthymia   . Fibromyalgia   . GERD (gastroesophageal reflux disease)   . History of headache   . Hodgkin lymphoma of extranodal or solid organ site [facility])   . Hypothyroid   . IBS (irritable bowel syndrome)   . Metabolic syndrome X   . Morbid obesity ([place])   . OSA (obstructive sleep apnea)   . Vitamin D deficiency    Past Surgical History:  Procedure Laterality Date  . APPENDECTOMY    . BILATERAL SALPINGOOPHORECTOMY  2007   [facility]  . CHOLECYSTECTOMY  1982  . LAPAROSCOPIC GASTRIC BANDING  08/2007   Dr. Hassell Done  . LAPAROSCOPIC HYSTERECTOMY  2007   [facility]  . 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 Marital status: Single    Spouse name: Not on file  . Number of children: 1  . Years of education: Not on file  . Highest education level: Not on file  Occupational History  . Not on file  Social Needs  . Financial resource strain: Not on file  . Food insecurity:    Worry: Not on file    Inability: Not on file  . Transportation needs:    Medical: Not on file    Non-medical: Not on file  Tobacco Use  . Smoking status: Former Smoker    Packs/day: 1.00    Types: Cigarettes     Last attempt to quit: 03/03/1991    Years since quitting: 26.2  . Smokeless tobacco: Never Used  Substance and Sexual Activity  . Alcohol use: No    Alcohol/week: 0.0 oz  . Drug use: No  . Sexual activity: Not on file  Lifestyle  . Physical activity:    Days per week: Not on file    Minutes per session: Not on file  . Stress: Not on file  Relationships  . Social connections:    Talks on phone: Not on file    Gets together: Not on file    Attends religious service: Not on file    Active member of club or organization: Not on file    Attends meetings of clubs or organizations: Not on file    Relationship status: Not on file  Other Topics Concern  . Not on file  Social History Narrative   Does not exercise   3 cups of coffee a day   Single- one child whom she gave up for adoption at birth   And re-established contact in 2009 w/ new relationship and 2 grand daughters.    Allergies  Allergen Reactions  . Penicillins  REACTION: nausea and vomiting   Family History  Problem Relation Age of Onset  . Heart failure Father   . Cancer Mother   . Heart attack Sister      Past medical history, social, surgical and family history all reviewed in electronic medical record.  No pertanent information unless stated regarding to the chief complaint.   Review of Systems:Review of systems updated and as accurate as of 06/03/17  No headache, visual changes, nausea, vomiting, diarrhea, constipation, dizziness, abdominal pain, skin rash, fevers, chills, night sweats, weight loss, swollen lymph nodes,  chest pain, shortness of breath, mood changes.  Positive muscle aches, joint swelling, body aches  Objective  Blood pressure 110/62, pulse 99, height 5\' 5"  (1.651 m), weight 249 lb (112.9 kg), SpO2 95 %. Systems examined below as of 06/03/17   General: No apparent distress alert  mood and affect normal, dressed appropriately.  Tangential thoughts HEENT: Pupils equal, extraocular  movements intact  Respiratory: Patient's speak in full sentences and does not appear short of breath patient does have an oxygen tank with her. Cardiovascular: Trace lower extremity edema, non tender, no erythema  Skin: Warm dry intact with no signs of infection or rash on extremities or on axial skeleton.  Abdomen: Soft nontender  Neuro: Cranial nerves II through XII are intact, neurovascularly intact in all extremities with 2+ DTRs and 2+ pulses.  Lymph: No lymphadenopathy of posterior or anterior cervical chain or axillae bilaterally.  Gait antalgic MSK:  Non tender with full range of motion and good stability and symmetric strength and tone of shoulders, elbows, wrist, hip and ankles bilaterally.  Knee: Bilateral valgus deformity noted. Large thigh to calf ratio.  Tender to palpation over medial and PF joint line.  ROM full in flexion and extension and lower leg rotation. instability with valgus force.  painful patellar compression. Patellar glide with moderate crepitus. Patellar and quadriceps tendons unremarkable. Hamstring and quadriceps strength is normal.  After informed written and verbal consent, patient was seated on exam table. Right knee was prepped with alcohol swab and utilizing anterolateral approach, patient's right knee space was injected with22mg /mL monovisc (sodium hyaluronate) in a prefilled syringe was injected easily into the knee through a 22-gauge needle..Patient tolerated the procedure well without immediate complications.  After informed written and verbal consent, patient was seated on exam table. Left knee was prepped with alcohol swab and utilizing anterolateral approach, patient's left knee space was injected with 22mg /mL (sodium hyaluronate) in a prefilled syringe was injected easily into the knee through a 22-gauge needle..Patient tolerated the procedure well without immediate complications.    Impression and Recommendations:     This case required medical  decision making of moderate complexity.      Note: This dictation was prepared with Dragon dictation along with smaller phrase technology. Any transcriptional errors that result from this process are unintentional.

## 2017-06-03 NOTE — Patient Instructions (Signed)
Good to see you  Injection of monovisc given today  I really hope it helps but can take up to a month to work.  I am so happy you are getting sleep  Stay active when you can  See me again in 6 weeks

## 2017-06-03 NOTE — Assessment & Plan Note (Signed)
Failed all conservative therapy.  Visco supplementation given today.  Hopefully that this will be beneficial.  We discussed that this will take some time to work appropriately.  Will take 6 weeks.  Likely no flare will be anticipated but could occur.  Patient given handout including the AVS to discuss this.  We discussed that if this does not seem to help and patient would need potential surgical intervention the patient is a very high risk surgical patient.  Follow-up again in 4 weeks

## 2017-06-07 ENCOUNTER — Encounter: Payer: Self-pay | Admitting: Internal Medicine

## 2017-06-16 ENCOUNTER — Ambulatory Visit: Payer: Self-pay | Admitting: Family Medicine

## 2017-07-06 DIAGNOSIS — Z880 Allergy status to penicillin: Secondary | ICD-10-CM | POA: Diagnosis not present

## 2017-07-06 DIAGNOSIS — Z87891 Personal history of nicotine dependence: Secondary | ICD-10-CM | POA: Diagnosis not present

## 2017-07-06 DIAGNOSIS — H6123 Impacted cerumen, bilateral: Secondary | ICD-10-CM | POA: Diagnosis not present

## 2017-07-15 ENCOUNTER — Encounter: Payer: Self-pay | Admitting: Pulmonary Disease

## 2017-07-15 ENCOUNTER — Encounter: Payer: Self-pay | Admitting: Family Medicine

## 2017-07-15 ENCOUNTER — Ambulatory Visit (INDEPENDENT_AMBULATORY_CARE_PROVIDER_SITE_OTHER): Payer: Medicare HMO | Admitting: Family Medicine

## 2017-07-15 ENCOUNTER — Other Ambulatory Visit: Payer: Self-pay | Admitting: Pulmonary Disease

## 2017-07-15 ENCOUNTER — Ambulatory Visit: Payer: Medicare HMO | Admitting: Pulmonary Disease

## 2017-07-15 VITALS — BP 140/72 | HR 93 | Temp 98.1°F | Ht 65.0 in | Wt 250.0 lb

## 2017-07-15 DIAGNOSIS — G4733 Obstructive sleep apnea (adult) (pediatric): Secondary | ICD-10-CM

## 2017-07-15 DIAGNOSIS — F341 Dysthymic disorder: Secondary | ICD-10-CM

## 2017-07-15 DIAGNOSIS — K219 Gastro-esophageal reflux disease without esophagitis: Secondary | ICD-10-CM

## 2017-07-15 DIAGNOSIS — M170 Bilateral primary osteoarthritis of knee: Secondary | ICD-10-CM

## 2017-07-15 DIAGNOSIS — I872 Venous insufficiency (chronic) (peripheral): Secondary | ICD-10-CM | POA: Diagnosis not present

## 2017-07-15 DIAGNOSIS — J449 Chronic obstructive pulmonary disease, unspecified: Secondary | ICD-10-CM | POA: Diagnosis not present

## 2017-07-15 DIAGNOSIS — Z8542 Personal history of malignant neoplasm of other parts of uterus: Secondary | ICD-10-CM | POA: Diagnosis not present

## 2017-07-15 DIAGNOSIS — K589 Irritable bowel syndrome without diarrhea: Secondary | ICD-10-CM

## 2017-07-15 DIAGNOSIS — R7302 Impaired glucose tolerance (oral): Secondary | ICD-10-CM

## 2017-07-15 DIAGNOSIS — J9611 Chronic respiratory failure with hypoxia: Secondary | ICD-10-CM

## 2017-07-15 DIAGNOSIS — G4762 Sleep related leg cramps: Secondary | ICD-10-CM

## 2017-07-15 MED ORDER — GABAPENTIN 100 MG PO CAPS: 200.0000 mg | ORAL_CAPSULE | Freq: Every day | ORAL | 3 refills | Status: DC

## 2017-07-15 MED ORDER — TRAZODONE HCL 150 MG PO TABS: 150.0000 mg | ORAL_TABLET | Freq: Every evening | ORAL | 1 refills | Status: DC | PRN

## 2017-07-15 MED ORDER — LINACLOTIDE 72 MCG PO CAPS: 72.0000 ug | ORAL_CAPSULE | Freq: Every day | ORAL | 0 refills | Status: DC

## 2017-07-15 NOTE — Progress Notes (Signed)
Patient ID: Rebecca [name], female   DOB: [birth year], 67 y.o.   MRN: [id number]   Subjective:   HPI 67 y/o WF known to me w/ mult med problems as noted below...  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  LABS 3/15:  Chems- BS=95 A1c=6.8 (needs low carb diet, exercise, wt loss) & Creat=1.5 (mild renal insuffic due to Lasix rx; Rec to decrease Lasix40 to 1 tab Qam...  2DEcho 4/15 showed norm LV sys function w/ EF=60-65%, normal wall motion, Gr1DD, normal valves, normal RV & PA pressures...   she had Cards eval [name] 4/15> he did not feel that invasive testing was warranted; she had 2DEcho but refused Myoview due to co-pay costs...   She remains on ADVAIR500Bid, SPIRIVA daily, & ProairHFA prn (uses 3-4 x daily "It really helps"); has Home O2 but dislikes the Liq O2 therefore not using & she wants a portable O2 concentrator to read 4L/min w/ exercise, 2L/min at rest.   She has continued to research her supplements and herbal meds>  She tells me that she is living on smoothies she makes out of juice, ice, baking soda, pomegranate syrup, & local honey;  Tumeric helps the arthritis in her knees and she notes that pinching her upper lip in the middle under her nose helps relieve musc cramps in her legs.  Ambulatory O2 Test 9/15>  O2 sat on RA at rest= 92% w/ pulse=86;  Nadir O2sat on RA after 2 laps= 84% w/ pulse=96...   CXR 9/15 showed COPD/emphysema, no acute abnormality...  LABS 9/15:  Chems- ok w/ BS=119, A1c=6.7, Cr=1.4;  CBC- ok...  2/16 [facility] records indicated a thorough evaluation & excellent care provided but treatment plan was hampered by noncompliance- refused CPAP, declines chestPT, alternately stopping her O2 & demanding more O2,   LABS 2/16:  ABG- pH=7.46, pCO2=35, pO2=134 on 6L/min; Chems- ok x BS=169, A1c=6.5; CBC- normal; TSH=0.39 & FreeT4=1.54;  Troponin/ D-dimer/ BNP- all wnl...  CXR 2/16 showed heart at upper lim of norm, hyperinflation, min bibasilar atx, NAD.Marland KitchenMarland Kitchen   EKG 2/16 showed NSR, rate88, low voltage, otherw wnl...  2DEcho 2/16 showed norm LVF w/ EF=55-60%, Gr1DD, norm valves and norm RV...   CXR 2/16 showed heart at upper lim of norm, hyperinflation, min bibasilar atx, NAD  Spirometry 06/2014 showed FVC=1.61 (50%), FEV1=0.88 (35%), %1sec=55, mi-flows are reduced to 18% predicted... 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  LABS 5/16:  Chems- wnl w/ BS=90, Cr=1.02;  Mg=2.2, Ca=9.0;  Uric=6.6.Marland KitchenMarland Kitchen   ~  September 04, 2014:  6wk ROV & Artis Delay has had ENT & Pulm evaluations at [facility] per our requests>>      ENT eval by DrPlonk at [facility] & his notes are reviewed> HxCOPD w/ revers component and chr nasal obstruction L>R, nasal crusting, bilat inferior nasal turbinate hypertrophy, etc;  They rec nasal saline, nasal steroid spray, mupirocin ointment, and breathe right nasal strips; they will consider surg if not improved...       Pulm second opinion consult 08/14/14 by Dr Germain Osgood & DrHaponik at [facility] COPD-severe centrilobular emphysema/asthma overlap, ex-smoker quit 1993; chr hypoxemic resp failure on home O2, OSA not on CPAP; c/o dyspnea & can't breathe thru her nose (causing her to not use her oxygen), morbid obesity w/ prev lap band surg (unsuccessful in losing weight); on Medrol8, Advair500Bid,  Spiriva daily, Nebs w/ xopenex & AlbutHFA prn; pt notes that Lasix80 helps her breathe;  Lung exam was clear;  They did PFT 08/21/14 showing FVC=1.63 (50%), FEV1=0.63 (25%), %1sec=39;  FEV1 post bronchodil= 0.97 (39%) for a 50% improvement; DLCO was 22% predicted... CTChest 08/21/14 showed severe centrilobular emphysema, diffuse bronchial thickening, mild mucus plugging, no adenopathy, no lung nodules etc; also showed mild Ao & coronary calcif, lap band at GE junction, splenosis, DJD Tspine... PLAN> They decided to slowly wean the Medrol, continue  Advair & wean to Advair250Bid, continue Spiriva, continue NEBS & AlbutHFA; rec to continue her oxygen (but she continues to use it "prn"), consider repeat sleep study & CPAP titration, consider referral to [place] rehab, referred for nutritional counseling & ROV 39mo..      She reports breathing well recently, she has a new roommate, eating out a lot, went to "the club", then one day over the weekend=> incr SOB, she's been taking Lasix80 Qam, rambling hx w/ flight of ideas regarding coffee, caffeine, not urinating enough, she had to use [facility] transport to [place] to get to the clinic;  They decreased her Advair250-Bid, Medrol6569md, continue Nebs (she's using Bid because she "cramps up" if she does it tid); she is awaiting nutritional counseling, pulm rehab referral, oxygen titration testing, sleep study...  We reviewed prob list, meds, xrays and labs> see below for updates >>  PLAN>>  She will maintain close contact & f/u w/ [facility];  Reminded to avoid sodium & carbs- handout given; plan ROV here in 2-69m17mo  ~  December 05, 2014:  69mo75mo & [name]'s new PCP is in [place]> she persists w/ 4+somatic complaints and signif health related anxiety; [facility] "they kicked me out due to CP- I can't go back until I get this evaluated"; she's been seeing Dr. SaraGermain Osgoodlm Fellow w/ [facility] & I have reviewed all the Care Everywhere notes-- several telephone encounters including their last 45 min conversation well documented, not much reversibility, they hoped for small improvement w/ Rehab exercise & staying on meds but she won't fill rx, can't afford, tearful/ frustrated/ depressed but refused psyche consult etc... Pt indicates that DrStephens said there was nothing wrong w/ her heart & pt doesn't want cardiac eval; DrStephens wants her to wean the Medrol but she's breathing better on 4mg/60mshe wants 6mg/d31mrec to compromise w/ 6mg al52m/  4mg Qod39mhe wants a liq oxygen system again- she is quite adamant (being a former "nurse")Insurance claims handlershe needs 4-5L/min when active and tank only lasts 1-2H; she uses 4L/min Qhs as well; but O2sat=92% on RA at rest today; she refuses to go back to rehab, therefore encouraged to do it daily on her own; she has not pursued further bariatric considerations- she is working w/ Drlori's office regarding eating disorder "he doesn't think I'm depressed"...      IMP/PLAN>>  [name] has Artis Delayere airflow obstruction/ emphysema w/ little reversibility; Rec to take SYMBICORT160-2spBid & Spiriva daily; use Medrol 4mg tabs58mmg alt w61mmg Qod; X51mnex NEBS Tid;  Alprax 0.5mg - 1/2 t50m tab tid; ROV w/ me in 9mo...  ~  N69mober 7, 2016:  9mo ROV & pt 98momult complaints> head "stopped-up", ears congested, tried pseudophed but "it made me sick", doesn't want ENT f/u, rec to try Saline nasal mist & phenylephrine decongestant;  She is discouraged by her weight, can't seem  to lose any, she described in detail her meals from yest (we discussed calories "in" & "out", eat less, burn more), she says local Gym won't take her w/ oxygen;  She asked about Stem Cell treatment for COPD- I offered to send her to [facility] to inquire about this or lung transplant;  She is very frustrated- eating is an issue because when she fills up she can't breathe well due to her prev LapBand surg she says, if she belches she feels better...       Rec to continue Medrol12m- she incr herself to 618md, Symbicort160-2spBid, Spiriva daily, Xopenex NEBS Tid (only doing it once/d), Guaifenesin400- taking 6/d w/ fluids, O2- she is not using it at rest, using 5L/min w/ exercise; she takes Lasix40, Tamadol50, & Xanax0.5 as needed...   LABS 01/2015>  Chems- ok x Cr=1.52, BS=114;  CBC- ok x wbc=17K w/ left shift;  TSH=1.17;  Fe=78 (22%sat);  VitB12=864...   XRay [facility] 01/2015>  Mild ant wedge compression L1, DDD & anterolithesis L4 on L5, poss right kidney stones...  ~   April 09, 2015:  3m871moV & [name] continues to complain about ever worsening breathing- DOE, dry cough, chest tightness, leg cramps, difficulty sleeping; notes good days and bad- "I got worked up over the election" she says; she thinks the zoloft is helping but she can't sleep w/ it- rec to try TRAZODONE instead (100m69m/2 to 1 tab Qhs);  Currently taking Oxygen that she adjusts herself, Medrol4mg/68mNEBS w/ Xopenex Tid (but she says insurance won't cover it), Symbicort160-2spBid, Spiriva daily, Lasix40 (just taking prn)... I suggested that she try the Oximizer=> given to pt to try...    See 08/2014 note above for summary of Pulm second opinion at [facility]  She saw CARDS at [facility]> reviewed in [facility] [place] w/ NSR, wnl, NAD; prev 2DEcho 04/2014 showed norm LVF w/ EF=55-60%, norm wall motion, G1DD, RVF was also wnl; she had some chest discomfort [facility] My[facility] but pt never followed thru due to dyspnea & O2 requirements; they also considered [facility]HC but she never returned as requested...    She has seen DrZSmith for SportsMed/ PM&R> c/o back pain & leg pain, his note is reviewed... EXAM shows Afeb, VSS, O2sat=91% on RA in office;  HEENT- nasal congestion, erythema, turbinate hypertrophy, dev septum, throat= Mallampati 2, sl red, no exudates or lesions;  Chest- decr BS bilat w/o w/r/r & no signs of consolidation;  Heart- RR w/o m/r/g;  Abd- Obese, panniculus, soft/ nontender;  Ext- VI w/ trace edema;  Psyche- very distraught...  Ambulatory Oximetry on 3L/min [place]>  O2sat=96% on 3L/min at rest w/ HR=107/min;  She ambulated 1 Lap on 3L/min w/ nadir O2sat=89% w/ HR=125/min...  Ambulatory Oximetry on 3L/min w/ pendant oxymizer>  O2sat=99% on 3L/min at rest w/ pulse=98/min;  She ambulated one lap & stopped due to dyspnea w/ nadir O2sat=91% w/ HR=123/min... IMP/PLAN>>  She will try the Oximizer at home, switch from zoloft to TRAZODONE 100mg-52m to 1 tab Qhs, try to wean MEDROL  4mg is69mle to 4mg Qod26m increments; rov in 3 months time...  ~  Jul 09, 2015:  71mo ROV 51mol continues to complain about everything- productive cough w/ lots of clear sput, chr stable DOE w/ min activ, denies CP- says she's doing well w/ her NEB Rx;  After he last visit w/ prescribed an Oximizer but she says "no help at all"; we tried  Trazodone100 Qhs for sleep but she tells me she didn't need it; we discussed weaning down the dose of Medrol (she was on 74m/d) and she was able to diminish this to 436mod but symptoms flaired & she went right back up to 68m56mam again; she is taking gen Guaifenesin 400m69mbs- 8/d in divided doses + one gal fluids daily- this helps her cough & congestion;  Offered referral to [facility] for a 3rd opinion regarding her COPD and poss transplant eval but says she was told she had to lose 100# first ...  She wants a thorough THYROID & pituitary eval w/ "reverse T3" checked=> we will refer to Endocrine;  She says her hands are cramping- asked to try soaking hands in hot water but she has several reasons why she can't get to the sink...     Hx mild OSA w/ mod desat, loud snoring, & leg jerks (on sleep study 2005)> she tried CPAP10 but stopped on her own & declined to repeat split night study & re-try CPAP rx; she says she's sleeping satis & wakes feeling refreshed...    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 1993; supposed to be on HomeO2- 2L rest & 4L exercise, NEBS w/ Duoneb vs Xopenex Tid, Symbicort160-2spBid, Spiriva daily, Mucinex1-2Bid but not taking anything regularly; states that Flutter causes "spasms" in her back; states O2 doesn't help since she can't breathe thru her nose (she has been eval by ENT, [facility]ford[facility] tried [facility] stopped after 6mo 76mofruitless"; prev stated she "can't breathe at all, can't do anything" etc; SOB w/ ADLs etc; Note> [facility]- last PFT (2011) showed FEV1= 1.5 (55%) & %1sec=59 ==> 3/15 she announced that  she's been cured by taking a Magnesium supplement, along w/ herbal formula "Clear lungs" and "Lung tonic"; she had stopped her Oxygen;  Now using O2 up to 15L/min on her own since she can't breathe thru her nose- advised 2L/min rest & 4L/min exercise + humidity, nasal hygiene, etc; on MEDROL4-68mg/d40m  Hx AtypCP> baseline EKG w/ minor NSSTTWA; 2DEcho w/ norm LVF, EF=55-60%, Gr1DD, norm valves, norm LA/RV; Myoview 2009 normal w/o ischemia & EF=55%; she notes some atypCP & saw DrNishCherly Hensen she declined Myoview; [facility] to [facility] in [facility] 2016 until she'd had cardiac eval- she declined...    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    Marland KitchenMarland Kitchenypothyroid> not currently on meds but she really wants thyroid Rx; prev TFTs all wnl; she prev had Endocrine in [place] prescribe Synthroid for her but she stopped going; clinically & biochemically euthyroid; Labs 2/16 showed TSH=0.39 & FreeT4=1.54    Morbid Obesity> peak wt~340# before lap band surg 2009 by DrMMartin; lost to ~250# but then back up to 280-90#; refused f/u w/ [facility] due to cost of visits & lap-band adjustments; we reviewed diet & exercise- Wt betw 255-265#in last yr.    GI- GERD, IBS> she uses OTC PPI as needed; last colonoscopy was 1982 by DrPatterson & wnl, she refuses GI referral & refuses f/u colon etc...    GYN- s/p uterine cancer> s/p lap hyst & BSO 2007  by DrSkinner at [facility]...    DJD, ?FM, VitD defic> prev on Vicodin, Tramadol, Aleve, VitD; prev eval by DrDeveshwar for Rheum; now she states that [name] ("horse linament") applied topically has worked like a miracle & she has increased exercise etc...     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 EXAM shows Afeb, VSS, O2sat=91% on RA in office;  HEENT- nasal congestion, erythema, turbinate hypertrophy, dev septum, throat= Mallampati 2, sl red, no exudates or lesions;  Chest- decr BS bilat w/o w/r/r & no signs of consolidation;  Heart- RR w/o m/r/g;  Abd- Obese, panniculus, soft/ nontender;  Ext- VI w/ trace edema;  Psyche- very distraught...  SHE NEEDS F/U LABS IMP/PLAN>>  GArtis Delaywants an Endocrine eval & we will refer to endocrinologist;  Needs f/u labs but she wants the endocrine eval;  Advised to try to wean the Medrol to 417mQam;  She must lose the weight & then reassess & consider referral to [facility];  We [plan ROV in 75m54moeminded to bring al mmed bottles to every office visit.  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    She has mult somatic complaints> "I  have a weak back"; now on a diet- eating 1 meal/d & DrLurie is concerned that she isn't eating enough (wt up 5# to 271#); she says she has done her internet research & found a blogger who has COPD & improved w/ CBD oil (hemp oil) "it's pharmaceutical grade out of [place] contain any THC"; she places 20mg unde72mr tongue & it's supposed to help her lungs- says it's working as she has been able to wean down her oxygen she says "I'm keeping a diary"...    She saw [name] ENT 10/02/15> on CPAP, nasal septal deviation, cerumen impactions removed...     We reviewed her prescription meds>  InsH[employer] Enterprisescover xopenex/ levalbuterol; on Medrol 4mg- weane44merself down to 4-2-4-2 Qod, Symbicort160-2spBid, Spiriva daily, NEBS w/ Duoneb (but she has cut down to just once daily), Ventolin-HFA prn (she likes this & uses it 3-4x daily), Mucinex 600mg- uses 69m as needed EXAM shows Afeb, VSS, O2sat=88% on RA in office;  Wt=271#; HEENT- nasal congestion, erythema, turbinate hypertrophy, dev septum, Throat= Mallampati 2, sl red, no exudates or lesions;  Chest- decr BS bilat w/o w/r/r & no signs  of consolidation;  Heart- RR w/o m/r/g;  Abd- Obese, panniculus, soft/ nontender;  Ext- VI w/ trace edema;  Psyche- remains distraught... IMP/PLAN>>  She is excited about the CBD oil Rx off the internet & has weaned down her O2 and Medrol;  I pointed out her resting RA O2sat=88% today in the office 7 reminded to stay on her O2 regularly 2L/nmin rest 7 4L/min w/ exercise;  She has weaned her Medrol to 70m alt w/ 267mqod and anxious to go lower;  Reminded to stay on her regular med regimen all the time (see above)...   ~  February 10, 2016:  9m82moV & GilArtis Delayturns w/ mult somatic complaints- worse SOB but then tells me that her breathing is much better due to a special oil she is using, "I read on-line about NOT increasing oxygen when you are breathless", c/o decr hearing "it's coming from inside my head" & I [facility]; [place] & we discussed the role of her hi flow O2- rec SALINE nasal mist frequently; pt called us [place]17 & said that her breathing was doing fantastic on Medrol reduced to 2mg35m, since then she has weaned OFF the Medrol on her own & notes "my lungs are dry & clear" and  "I solved my oxygen prob w/ long nasal prongs and saline spray"... HER CC TODAY IS A PROB w/ [facility] AS THEY WON'T SUPPLY HER w/ E-TANKS LIKE SHE WANTS THEM; we reviewed the following medical problems during today's office visit >>     Hx mild OSA w/ mod desat, loud snoring, & leg jerks (on sleep study 2005)> she tried CPAP10 but stopped on her own & declined to repeat split night study & re-try CPAP rx; she says she's sleeping satis & wakes feeling refreshed, does not want to re-assess her sleep...    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 1993; supposed to be on HomeO2- 2L rest & 4L exercise, NEBS w/ Duoneb Tid, Symbicort160-2spBid, Spiriva daily, GFN400- encouraged to use 2Tid w/ fluids; she stopped Medrol on her own recently; states that Flutter causes "spasms" in her back; states O2 doesn't help since she can't breathe thru her nose (she has been eval [facility] finally tried PulmRehab but stopped after 82mo 65mofruitless"; prev stated she "can't breathe at all, can't do anything" etc; SOB w/ ADLs etc; Note> [facility]- last PFT (2011) showed FEV1= 1.5 (55%) & %1sec=59 ==> 3/15 she announced that she's been cured by taking a Magnesium supplement, along w/ herbal formula "Clear lungs" and "Lung tonic"; she had stopped her Oxygen;  Now using O2 up to 15L/min on her own since she can't breathe thru her nose- advised 2L/min rest & 4L/min exercise + humidity, nasal hygiene, etc...     Hx AtypCP> baseline EKG w/ minor NSSTTWA; 2DEcho w/ norm LVF, EF=55-60%, Gr1DD, norm valves, norm LA/RV; Myoview 2009 normal w/o ischemia &  EF=55%; she notes some atypCP & saw DrNisCherly Hensen, she declined Myoview; [facility] refused to reinstate her in [place] until she'd had cardiac eval- she declined...    Ven Insuffic> on Lasix40 (1or2 prn edema); she knows not to eat sodium, elev legs, wear support hose; Labs 2/16 showed Cr=1.2    Impaired Gluc Tolerance, Metabolic syndrome> she refused meds; prev eval by DrEllison w/ rec for Metformin but she  rejects DM diagnosis & refused meds; states "I'm eating healthier" (advised low carb low fat); Labs 2/16 showed BS~160, A1c=6.5.Marland KitchenMarland Kitchen    ?Hypothyroid> now followed by Dr.Smith in [place]; she is not on thyroid medication but she says he wants her off the Medrol so she weaned off this med on her own; prev TFTs all wnl; she prev had Endocrine in [place] prescribe Synthroid for her but she stopped going; clinically & biochemically euthyroid; Last Labs here 11/16 showed TSH=1.17 & FreeT4=1.05; Last note from [name] was 08/2015- we do not have further notes...    Morbid Obesity> peak wt~340# before lap band surg 2009 by DrMMartin; lost to ~250# but then back up to 280-90#; refused f/u w/ [facility] due to cost of visits & lap-band adjustments; we reviewed diet & exercise- Wt betw 255-265# in 2017...    GI- GERD, IBS> she uses OTC PPI as needed; last colonoscopy was 1982 by DrPatterson & wnl, she refuses GI referral & refuses f/u colon etc...    GYN- s/p uterine cancer> s/p lap hyst & [facility]...    DJD, ?FM, VitD defic> prev on Vicodin, Tramadol, Aleve, VitD; prev eval by DrDeveshwar for Rheum; now she states that [name] ("horse linament") applied topically has worked like a miracle & she has increased exercise etc; she has mild ant wedging of L1 on XRay + DDD & degen facet joint changes...    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 EXAM shows Afeb, VSS, O2sat=93% on RA in office;  HEENT- sl nasal congestion, erythema, turbinate hypertrophy, dev septum, throat= Mallampati 2, sl red, no exudates or lesions;  Chest- decr BS bilat w/o w/r/r & no signs of consolidation;  Heart- RR w/o m/r/g;  Abd- Obese, panniculus, soft/ nontender;  Ext- VI w/ trace edema...  LABS 02/10/16>  Chems- ok w/ BS=105, BUN=25, Cr=1.24 IMP/PLAN>>  Overall GArtis Delayis stable- she requests 90d refill prescriptions and we did this;  We reviewed her labs, XRays, & PFTs- rec to take meds regularly & not adjust on her own, rec to return to exercise program & consider pulm rehab; note: >50% of this 25 min f/u appt was spent on counseling & coordination of care...   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    [facility]  Chr nasal obstruction L>R (rec humidification & saline, Flonase, & breathe-right nasal strips) & hearing loss=> cerumen impactions removed & audiogram revealed hi freq hearing loss.  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 1993; supposed to be on HomeO2 at 2-3L rest & 4+L exercise, NEBS w/ Duoneb Tid, Symbicort160-2spBid, Spiriva daily, GFN400- encouraged to use 2Tid w/ fluids; she stopped Medrol on her own;  states that Flutter causes "spasms" in her back; states O2 doesn't help since she can't breathe thru her nose (she has been eval by ENT- [place]); finally tried PulmRehab but stopped after 50moas "fruitless"; prev stated she "can't breathe at all, can't do anything" etc; SOB w/ ADLs etc; Note> [facility] [place]- last PFT (2011) showed FEV1= 1.5 (55%) & %1sec=59 ==> 3/15 she announced that she's been cured by taking a Magnesium supplement, along w/ herbal formula "Clear lungs" and "Lung tonic"; she had stopped her Oxygen;  Now using O2 up to 15L/min on her own since she can't breathe thru her nose- advised 2L/min rest & 4L/min exercise + humidity, nasal hygiene, etc;  Recently noted that her breathing improved by taking a "special oil" & she solved her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 1982 by DrPatterson was WNL... She has refused f/u GI eval & f/u colonoscopy...  ?KIDNEY STONE Hx >> pt said she passed a kidney stone 3/11 w/o any medical attention, w/o work up or proof of same; entry made here for future reference if needed.  UTERINE CANCER, HX OF (ICD-V10.42) - [place]   DEGENERATIVE JOINT DISEASE (ICD-715.90) - on VICODIN tid prn,  ANAPROX 229mBid prn, ULTRAM 5042mrn... ? 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 she was on Wellbutrin & Vicodin when last seen in 2006... VITAMIN D DEFICIENCY (ICD-268.9) - Vit D level 5/10 = 16... rec> start OTC Vit D 2000 u daily. ~  9/14: on Vicodin, Tramadol, Aleve, VitD, etc; prev eval by  DrDeveshwar for rheum; now c/o pain in left knee (she thinks it's "siatica"), can't exercise & wants to see [facility]- ok... ~  3/15: she states that topical [name] ("horse linament") rubbed into knees really helps but she wants to keep the Vicodinon hand just in case...  Hx of HEADACHE (ICD-784.0)  DYSTHYMIA (ICD-300.4) - off Celexa,  off Zoloft, and on ALPRAZOLAM 0.37mTid as needed... she has seen psychiatry [place] the [place] and was Dx w/ seasonal affective disorder... prev seeing psychologist [name] for eating disorder counselling... ~  4/13: she mentioned seeing [name] again for eating disorder & counseling for depression etc... ~  9/14: on Alprazolam 0.521mtid prn; prev eval by Psychiatry w/ seasonal affective disorder; still sees Psychology [name] re: eating disorder; under considerable stress due to relationship w/ sister, financial pressure, etc...  ~  3/15: she has the Alpraz0.27m63mor prn use but seldom uses it; still sees  [name] on occas but doesn't think she needs to continue... ~  11/15:  She wants antidepressant med & we discussed Zoloft tria 100m59m/2 => 1 tab daily... She still sees Psychologist [name]. ~  3/16:  She is off the Zoloft & states the AlprSharin Gravetoo strong (advised cutting the med & try lower dose for the desired effect...  HEALTH MAINTENANCE:   ~  GI:  Per seen by DrPatterson but last colon was 1982 & she refuses follow up... ~  GYN:  [facility]  She had Pneumovax in the past but declines further immuniz- declines Flu shots and Tetanus shots...   Past Surgical History:  Procedure Laterality Date  . APPENDECTOMY    . BILATERAL SALPINGOOPHORECTOMY  2007   [facility]  . CHOLECYSTECTOMY  1982  . LAPAROSCOPIC GASTRIC BANDING  08/2007   Dr. MartHassell DoneLAPAROSCOPIC HYSTERECTOMY  2007   [facility]  . SPLENECTOMY  at [facility]   d/t trauma    Outpatient Encounter Medications as of 07/15/2017  Medication Sig  . albuterol (VENTOLIN HFA) 108 (90 Base) MCG/ACT inhaler INHALE 2 PUFFS INTO THE LUNGS EVERY 6 HOURS AS NEEDED FOR WHEEZING OR SHORTNESS OF BREATH  . ALPRAZolam (XANAX) 0.5 MG tablet TAKE 1/2 TO 1 TABLET BY MOUTH 3 TIMES DAILY AS NEEDED FOR NERVES. MAX DAILY DOSE = 3 TABLETS  . B Complex-C-Folic Acid (B-COMPLEX BALANCED) TABS Take 4 tablets by mouth 2 (two) times daily.   . baclofen (LIORESAL) 10 MG tablet Take 1 tablet (10 mg total) by mouth 3 (three) times daily as needed for muscle spasms.  . budesonide-formoterol (SYMBICORT) 160-4.5 MCG/ACT inhaler Inhale 2 puffs into the lungs 2 (two) times daily.  . cetirizine (ZYRTEC) 10 MG tablet Take 10 mg by mouth daily.    . Cholecalciferol (VITAMIN D-3) 5000 UNITS TABS Take 2 tablets by mouth daily.   . colchicine (COLCRYS) 0.6 MG tablet Take 1 tablet (0.6 mg total) by mouth daily.  . fluticasone (FLONASE) 50 MCG/ACT nasal spray Place 2 sprays into both nostrils daily.  . furosemide (LASIX) 40  MG tablet TAKE 1-2 TABLETS ONCE A DAY AS NEEDED FOR SWELLING  . glucosamine-chondroitin 500-400 MG tablet Take 1 tablet by mouth 3 (three) times daily.  . guMarland KitcheniFENesin (MUCINEX) 600 MG 12 hr tablet Take 1,600 mg by mouth 2 (two) times daily.  . ipMarland Kitchenatropium-albuterol (DUONEB) 0.5-2.5 (3) MG/3ML SOLN Take 3 mLs by nebulization 3 (three) times daily.  . Magnesium 400 MG CAPS Take  2 capsules by mouth 3 (three) times daily.   . naproxen sodium (ANAPROX) 220 MG tablet Take 220 mg by mouth daily.   . NON FORMULARY Mullin herbal supplement- take 1 tab BID  . OXYGEN Inhale 4 L into the lungs.  . tiotropium (SPIRIVA HANDIHALER) 18 MCG inhalation capsule Place 1 capsule (18 mcg total) into inhaler and inhale daily.  . traMADol (ULTRAM) 50 MG tablet Take 1 tablet (50 mg total) by mouth 3 (three) times daily as needed.  . TURMERIC PO Take 400 mg by mouth 2 (two) times daily.  . vitamin A 10000 UNIT capsule Take 10,000 Units by mouth daily.  . vitamin C (ASCORBIC ACID) 250 MG tablet Take 250 mg by mouth daily.  . vitamin E 100 UNIT capsule Take by mouth daily.  . [DISCONTINUED] traZODone (DESYREL) 100 MG tablet Take 1 tablet (100 mg total) by mouth at bedtime as needed for sleep.  Marland Kitchen linaclotide (LINZESS) 72 MCG capsule Take 1 capsule (72 mcg total) by mouth daily before breakfast.   No facility-administered encounter medications on file as of 07/15/2017.     Allergies  Allergen Reactions  . Penicillins     REACTION: nausea and vomiting    Current Medications, Allergies, Past Medical History, Past Surgical History, Family History, and Social History were reviewed in [employer] record.   Review of Systems:         See HPI - all other systems neg except as noted... The patient complains of dyspnea on exertion and difficulty walking; chest discomfort, & leg weakness.  The patient denies anorexia, fever, weight loss, weight gain, vision loss, decreased hearing, hoarseness, syncope,  prolonged cough, headaches, hemoptysis, abdominal pain, melena, hematochezia, severe indigestion/heartburn, hematuria, incontinence, suspicious skin lesions, transient blindness, depression, unusual weight change, abnormal bleeding, enlarged lymph nodes, and angioedema.    Objective:   Physical Exam:    WD, Morbidly Obese, 68 y/o WF in no acute distress but c/o dyspnea/ wheezing... GENERAL:  Alert & oriented; pleasant & cooperative... HEENT:  [place]/AT, EOM-wnl, PERRLA, EACs-clear, TMs-wnl, NOSE-clear, THROAT-clear & wnl. NECK:  Supple w/ fairROM; no JVD; normal carotid impulses w/o bruits; no thyromegaly or nodules palpated; no lymphadenopathy. CHEST:  no accessory musc use, decr BS bilat, no wheezing/ rales/ rhonchi... HEART:  Regular Rhythm; without murmurs/ rubs/ or gallops heard... ABDOMEN:  Obese, soft & nontender; normal bowel sounds; no organomegaly or masses detected. EXT: without deformities, mod arthritic changes; no varicose veins/ +venous insuffic/trace edema. NEURO:  CNs intact; no focal neuro deficits... DERM: few ecchymoses, no rash etc...  RADIOLOGY DATA:  Reviewed in the EPIC EMR & discussed w/ the patient...  LABORATORY DATA:  Reviewed in the EPIC EMR & discussed w/ the patient...   Assessment & Plan:    ENT>> she was working w/ DrPlonk at [employer], [employer] in [place]...  OSA>  As prev noted she needs new sleep study & appropriate new CPAP apparatus to facilitate compliance but she declines the repeat study or sleep med referral...  COPD/ Emphysema>  Acute exac 2/16 improved after hosp in [place] w/ Levaquin, Pred taper, NEBS Bid, Avair500, Spiriva, Mucinex etc... Encouraged to stay on her O2 regularly & we will add Humidity, needs max nasal hygiene vs ENT follow up; continue NEBS/ Advair500/ Spiriva regularly & use the Mucinex; we will request the [facility] home Care home COPD program; needs to lose wt & incr exercise... 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 5/16>  She has seen ENT & Pulm 2nd opinion is pending; she cannot manipulate the port O2 tanks and we will request an INOGEN One- port O2 concentrator for her use...  5/16>  States breathing worse, asked to use all regular meds regularly & we will add Depo120, Medrol taper... 6/16>  She had 2nd opinion consult w/ DrStephens/ DrHaponik at [facility] see above... 10/16>  Pt very frustrated after working w/ the [employer] division at [employer] for several months- Nebs, Advair500, Spiriva, Medrol, Oxygen, pulm rehab, etc...  11/16>  On Medrol49m- she incr herself to 662md, Symbicort160-2spBid, Spiriva daily, Xopenex NEBS Tid (only doing it once/d), Guaifenesin400- taking 6/d w/ fluids, O2- she is not using it at rest, using 5L/min w/ exercise; she takes Lasix40, Tamadol50, & Xanax0.5 as needed... 8/17>  She is now taking an internet supplement CBD oil (hemp oil) under her tongue daily- "it's good for your lungs" & she has weaned down her O2 and Medrol; Advised to continue O2- 2L/min at rest & 4L/min w/ exercise and take her other meds regularly...  02/10/16>   Overall GiArtis Delays stable- she requests 90d refill prescriptions and we did this;  We reviewed her labs, XRays, & PFTs- rec to take meds regularly & not adjust on her own, rec to return to exercise program & consider pulm rehab. 04/16/16>    I wrote a prescription for a POC at her insistence;  We will check w/ other DME companies for her;  She is rec to continue the NEB w/ Duoneb tid followed by the Symbicort160-2spBid & the Spiriva once daily;  Maximize the GUAIFENESIN 40064mabs- 2Tid w/ fluids & work to expectorate the phlegm;  She still needs a regular exercise program/ pulm rehab & we discussed this... we plan rov in 3-19mo13mo6/18>   I have asked MsCook to be regular w/ her Duoneb Tid, Symbicort160-2spBid & spiriva once daily;  Ok to use her  supplements and "special oil" that she feels has been very beneficial to her in many ways... we plan ROV recheck in 19mon60month19/18>   She is stable and credits the CBD oil she is using; recommended to use her inhalers regularly including- O2 at prescribed, DUONEB-Tid, Symbicort-Bid, Spiriva daily, GFN400-2Tid w/ fluids, ProventilHFA as needed;  She refuses the FLU vaccine... 03/16/17>   [name] iArtis Delaytable on her FECO oil but I have reminded her to STAY on her O2, DUONEB Tid, Symbicort Bid, Spiriva daily, & Mucinex, fluids, etc;  CXR & LABS look good...  07/15/17>   [name] iArtis Delayeminded to follow the prescribed regimen-- O2 at 2L/min rest & 4L/min exerc; NEB w/ DUONEB TID, SYMBICORT160-2spBid, SPIRIVA once daily, AlbutHFA rescue as needed; MUCINEX 1200mg 38m fluids, Zyrtek & Flonase;  She has a long list of supplement medications that she ascribes to including FECO oil... I have nothing else to offer her at this point.   Hx CP w/ neg cardiac evals including prev Myoviews; 2DEchos w/o incr PA pressures... 4/15> she had Cardiology consult for eval of AtypCP (DrNisCherly Hensen was WNL & 2DEcho showed norm LVF, Gr1DD & norm RV & PA pressures... 2/16> [facility] good LVF, valves and norm RV...  OBESITY>  She had remote lap band surg by DrMMartin; Met syndrome, must get wt down etc; she is a lap band failure & prev counseling by [name]... 3/16> weight  down to 255# post hosp... 4/16> weight is back up to 271# despite all efforts... 8/17> weight = 271#  ? Hx Hypothy>  TFTs remain normal off meds- no problem... 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  GI>  GERD> she denies symptoms & uses OTC meds prn...  DJD>  She has DJD, ?FM, etc;  On Vicodin, Tramadol, OTC  NSAIDs as needed... C/O right ankle pain, eval by DrZSmith> no better on his anti-inflamm rx so we will Rx w/ Depo120 & Medrol taper... Neck pain followed by Orson Aloe & DrNewton...  Dysthymia>  Much stress in her life, on Alpraz prn; has embraced alt lifestyle...   Patient's Medications  New Prescriptions   GABAPENTIN (NEURONTIN) 100 MG CAPSULE    Take 2 capsules (200 mg total) by mouth at bedtime.   LINACLOTIDE (LINZESS) 72 MCG CAPSULE    Take 1 capsule (72 mcg total) by mouth daily before breakfast.   TRAZODONE (DESYREL) 150 MG TABLET    Take 1 tablet (150 mg total) by mouth at bedtime as needed for sleep.  Previous Medications   ALBUTEROL (VENTOLIN HFA) 108 (90 BASE) MCG/ACT INHALER    INHALE 2 PUFFS INTO THE LUNGS EVERY 6 HOURS AS NEEDED FOR WHEEZING OR SHORTNESS OF BREATH   ALPRAZOLAM (XANAX) 0.5 MG TABLET    TAKE 1/2 TO 1 TABLET BY MOUTH 3 TIMES DAILY AS NEEDED FOR NERVES. MAX DAILY DOSE = 3 TABLETS   B COMPLEX-C-FOLIC ACID (B-COMPLEX BALANCED) TABS    Take 4 tablets by mouth 2 (two) times daily.    BACLOFEN (LIORESAL) 10 MG TABLET    Take 1 tablet (10 mg total) by mouth 3 (three) times daily as needed for muscle spasms.   BUDESONIDE-FORMOTEROL (SYMBICORT) 160-4.5 MCG/ACT INHALER    Inhale 2 puffs into the lungs 2 (two) times daily.   CETIRIZINE (ZYRTEC) 10 MG TABLET    Take 10 mg by mouth daily.     CHOLECALCIFEROL (VITAMIN D-3) 5000 UNITS TABS    Take 2 tablets by mouth daily.    COLCHICINE (COLCRYS) 0.6 MG TABLET    Take 1 tablet (0.6 mg total) by mouth daily.   FLUTICASONE (FLONASE) 50 MCG/ACT NASAL SPRAY    Place 2 sprays into both nostrils daily.   FUROSEMIDE (LASIX) 40 MG TABLET    TAKE 1-2 TABLETS ONCE A DAY AS NEEDED FOR SWELLING   GLUCOSAMINE-CHONDROITIN 500-400 MG TABLET    Take 1 tablet by mouth 3 (three) times daily.   GUAIFENESIN (MUCINEX) 600 MG 12 HR TABLET    Take 1,600 mg by mouth 2 (two) times daily.   IPRATROPIUM-ALBUTEROL (DUONEB) 0.5-2.5 (3) MG/3ML SOLN    Take 3  mLs by nebulization 3 (three) times daily.   MAGNESIUM 400 MG CAPS    Take 2 capsules by mouth 3 (three) times daily.    NAPROXEN SODIUM (ANAPROX) 220 MG TABLET    Take 220 mg by mouth daily.    NON FORMULARY    Mullin herbal supplement- take 1 tab BID   OXYGEN    Inhale 4 L into the lungs.   TIOTROPIUM (SPIRIVA HANDIHALER) 18 MCG INHALATION CAPSULE    Place 1 capsule (18 mcg total) into inhaler and inhale daily.   TRAMADOL (ULTRAM) 50  MG TABLET    Take 1 tablet (50 mg total) by mouth 3 (three) times daily as needed.   TURMERIC PO    Take 400 mg by mouth 2 (two) times daily.   VITAMIN A 65997 UNIT CAPSULE    Take 10,000 Units by mouth daily.   VITAMIN C (ASCORBIC ACID) 250 MG TABLET    Take 250 mg by mouth daily.   VITAMIN E 100 UNIT CAPSULE    Take by mouth daily.  Modified Medications   No medications on file  Discontinued Medications   No medications on file

## 2017-07-15 NOTE — Progress Notes (Signed)
[name] [address], [place] [address] Phone: [phone] Subjective:      CC: Leg pain follow-up  [id number]  Rebecca [name] is a 67 y.o. female coming in with complaint of  Bilateral leg pain.  Has knee arthritis.  Given injections at last exam.  Viscosupplementation was given as well.  Patient has made some mild improvement.  Unfortunately she continues to complain of cramping at night in the lower extreme knees.  Patient states that she can only sleep about 2 hours before she starts having pain.  Patient has discussed this with her primary care provider and is discussing the possibility of a sleep study.  Patient states during the day she does not have any of the cramping.    Past Medical History:  Diagnosis Date  . Chest pain   . Chronic venous insufficiency   . COPD (chronic obstructive pulmonary disease) ([place])   . DJD (degenerative joint disease)   . Dysthymia   . Fibromyalgia   . GERD (gastroesophageal reflux disease)   . History of headache   . Hodgkin lymphoma of extranodal or solid organ site [facility])   . Hypothyroid   . IBS (irritable bowel syndrome)   . Metabolic syndrome X   . Morbid obesity ([place])   . OSA (obstructive sleep apnea)   . Vitamin D deficiency    Past Surgical History:  Procedure Laterality Date  . APPENDECTOMY    . BILATERAL SALPINGOOPHORECTOMY  2007   [facility]  . CHOLECYSTECTOMY  1982  . LAPAROSCOPIC GASTRIC BANDING  08/2007   Dr. Hassell Done  . LAPAROSCOPIC HYSTERECTOMY  2007   [facility]  . 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 Marital status: Single    Spouse name: Not on file  . Number of children: 1  . Years of education: Not on file  . Highest education level: Not on file  Occupational History  . Not on file  Social Needs  . Financial resource strain: Not on file  . Food insecurity:    Worry: Not on file    Inability: Not on file  .  Transportation needs:    Medical: Not on file    Non-medical: Not on file  Tobacco Use  . Smoking status: Former Smoker    Packs/day: 1.00    Types: Cigarettes    Last attempt to quit: 03/03/1991    Years since quitting: 26.3  . Smokeless tobacco: Never Used  Substance and Sexual Activity  . Alcohol use: No    Alcohol/week: 0.0 oz  . Drug use: No  . Sexual activity: Not on file  Lifestyle  . Physical activity:    Days per week: Not on file    Minutes per session: Not on file  . Stress: Not on file  Relationships  . Social connections:    Talks on phone: Not on file    Gets together: Not on file    Attends religious service: Not on file    Active member of club or organization: Not on file    Attends meetings of clubs or organizations: Not on file    Relationship status: Not on file  Other Topics Concern  . Not on file  Social History Narrative   Does not exercise   3 cups of coffee a day   Single- one child whom she gave up for adoption at birth   And  re-established contact in 2009 w/ new relationship and 2 grand daughters.    Allergies  Allergen Reactions  . Penicillins     REACTION: nausea and vomiting   Family History  Problem Relation Age of Onset  . Heart failure Father   . Cancer Mother   . Heart attack Sister      Past medical history, social, surgical and family history all reviewed in electronic medical record.  No pertanent information unless stated regarding to the chief complaint.   Review of Systems:Review of systems updated and as   No  visual changes, nausea, vomiting, diarrhea, constipation, dizziness, abdominal pain, skin rash, fevers, chills, night sweats, weight loss, swollen lymph nodes chest pain,  mood changes.  Positive headache, body aches, joint swelling, muscle aches patient also has shortness of breath at baseline  Objective  Blood pressure (!) 150/80, pulse 96, height 5\' 5"  (1.651 m), weight 250 lb (113.4 kg), SpO2 95 %.   General: No  apparent distress alert and oriented x3 mood and affect normal, dressed appropriately.  HEENT: Pupils equal, extraocular movements intact  Respiratory: Patient's speak in full sentences mildly short of breath wearing oxygen Cardiovascular: 1+ lower extremity edema, non tender, no erythema  Skin: Warm dry intact with no signs of infection or rash on extremities or on axial skeleton.  Abdomen: Soft nontender morbidly obese Neuro: Cranial nerves II through XII are intact, neurovascularly intact in all extremities with 2+ DTRs and 2+ pulses.  Lymph: No lymphadenopathy of posterior or anterior cervical chain or axillae bilaterally.  Gait antalgic MSK:  Non tender with full range of motion and good stability and symmetric strength and tone of shoulders, elbows, wrist, hip, and ankles bilaterally.  Knee: Bilateral valgus deformity noted. Large thigh to calf ratio.  Tender to palpation over medial and PF joint line.  ROM full in flexion and extension and lower leg rotation. instability with valgus force.  painful patellar compression. Patellar glide with moderate crepitus. Patellar and quadriceps tendons unremarkable. Hamstring and quadriceps strength is normal. No calf pain noted today    Impression and Recommendations:     This case required medical decision making of moderate complexity.      Note: This dictation was prepared with Dragon dictation along with smaller phrase technology. Any transcriptional errors that result from this process are unintentional.

## 2017-07-15 NOTE — Patient Instructions (Signed)
Today we updated your med list in our EPIC system...    Continue your current medications the same...  Continue your Oxygen, DUONEB treatments, Symbicort & Spiriva...  We gave you some samples of LINZESS 72mg  tabs- one tab daily for your bowel movements...  stayas active as possible, and work on weight reduction...  Call for any questions...  Let's plan a follow up visit in 27mo, sooner if needed for problems.Marland KitchenMarland Kitchen

## 2017-07-15 NOTE — Patient Instructions (Addendum)
  Good to see yo u Increased the trazadone to 150mg  at night Continue with the gabapentin 100-200mg  at night For the potential constipation add miralax 2 scoop daily  I think the sleep study would eb great  Try to stay active Hope the new roommate is going well  See me again in 6 weeks

## 2017-07-16 ENCOUNTER — Telehealth: Payer: Self-pay | Admitting: Pulmonary Disease

## 2017-07-16 MED ORDER — FUROSEMIDE 40 MG PO TABS: ORAL_TABLET | ORAL | 11 refills | Status: DC

## 2017-07-16 NOTE — Telephone Encounter (Signed)
Called and spoke to patient. Patient had OV yesterday and needed to have her lasix refilled. Reviewed OV note to continue. Refill sent to pharmacy. Nothing further needed at this time.

## 2017-07-16 NOTE — Assessment & Plan Note (Signed)
Patient seems to be stable after the injections.  Very minimal improvement but hopefully patient will do relatively well for quite some time.  We discussed icing regimen and home exercise.  Discussed which activities to do which wants to avoid.  Patient is to increase activity as tolerated.  Follow-up with me again 2 months

## 2017-07-16 NOTE — Assessment & Plan Note (Signed)
Continues to have difficulty.  Discussed that I do believe that a sleep study would be beneficial.  We attempted to change patient's gabapentin which patient was not very happy about.  We discussed increasing the trazodone which we did at this moment.  Has baclofen for any breakthrough.  Discussed wearing socks or compression that could be beneficial at night.  Follow-up again in 2 months

## 2017-07-22 ENCOUNTER — Encounter: Payer: Self-pay | Admitting: Internal Medicine

## 2017-08-18 NOTE — Progress Notes (Signed)
[name], [place] [address] Phone: [phone] Subjective:     CC: Knee pain and leg pain follow-up  OEU:[id number]  Rebecca [name] is a 67 y.o. female coming in with complaint of knee pain. The right is worse than the left. Had an increase 2 weeks ago. She has been having trouble sleeping.  Patient states that she is starting to have increasing leg cramping again.  Very frustrating to her.  Patient has not taking the vitamins that was suggested on a regular basis.  Patient has had calcific changes of the right kidney but CT scan patient has declined at this moment.  Continues to have significant difficulty with her COPD and chronic hypoxemic respiratory failure.     Past Medical History:  Diagnosis Date  . Chest pain   . Chronic venous insufficiency   . COPD (chronic obstructive pulmonary disease) ([place])   . DJD (degenerative joint disease)   . Dysthymia   . Fibromyalgia   . GERD (gastroesophageal reflux disease)   . History of headache   . Hodgkin lymphoma of extranodal or solid organ site [facility])   . Hypothyroid   . IBS (irritable bowel syndrome)   . Metabolic syndrome X   . Morbid obesity ([place])   . OSA (obstructive sleep apnea)   . Vitamin D deficiency    Past Surgical History:  Procedure Laterality Date  . APPENDECTOMY    . BILATERAL SALPINGOOPHORECTOMY  2007   [place]  . CHOLECYSTECTOMY  1982  . LAPAROSCOPIC GASTRIC BANDING  08/2007   Dr. Hassell Done  . LAPAROSCOPIC HYSTERECTOMY  2007   [facility]  . 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 Marital status: Single    Spouse name: Not on file  . Number of children: 1  . Years of education: Not on file  . Highest education level: Not on file  Occupational History  . Not on file  Social Needs  . Financial resource strain: Not on file  . Food insecurity:    Worry: Not on file    Inability: Not on file  . Transportation  needs:    Medical: Not on file    Non-medical: Not on file  Tobacco Use  . Smoking status: Former Smoker    Packs/day: 1.00    Types: Cigarettes    Last attempt to quit: 03/03/1991    Years since quitting: 26.4  . Smokeless tobacco: Never Used  Substance and Sexual Activity  . Alcohol use: No    Alcohol/week: 0.0 oz  . Drug use: No  . Sexual activity: Not on file  Lifestyle  . Physical activity:    Days per week: Not on file    Minutes per session: Not on file  . Stress: Not on file  Relationships  . Social connections:    Talks on phone: Not on file    Gets together: Not on file    Attends religious service: Not on file    Active member of club or organization: Not on file    Attends meetings of clubs or organizations: Not on file    Relationship status: Not on file  Other Topics Concern  . Not on file  Social History Narrative   Does not exercise   3 cups of coffee a day   Single- one child whom she gave up for adoption at birth   And re-established contact  in 2009 w/ new relationship and 2 grand daughters.    Allergies  Allergen Reactions  . Penicillins     REACTION: nausea and vomiting   Family History  Problem Relation Age of Onset  . Heart failure Father   . Cancer Mother   . Heart attack Sister      Past medical history, social, surgical and family history all reviewed in electronic medical record.  No pertanent information unless stated regarding to the chief complaint.   Review of Systems:Review of systems  No headache, visual changes, nausea, vomiting, diarrhea, constipation, dizziness, abdominal pain, skin rash, fevers, chills, night sweats, weight loss, swollen lymph nodes, chest pain, shortness of breath, mood changes.  Positive muscle aches, body aches, joint swelling  Objective  Blood pressure 118/68, pulse (!) 52, height 5\' 5"  (1.651 m), weight 250 lb (113.4 kg), SpO2 98 %.   General: No apparent distress alert and oriented x3 mood and affect  normal, dressed appropriately.  Tangential thought process patient spoke for a 19-minute duration on numerous topics HEENT: Pupils equal, extraocular movements intact  Respiratory: Patient is wearing oxygen Cardiovascular: Trace lower extremity edema, non tender, no erythema  Skin: Warm dry intact with no signs of infection or rash on extremities or on axial skeleton.  Abdomen: Soft obese Neuro: Cranial nerves II through XII are intact, neurovascularly intact in all extremities with 2+ DTRs and 2+ pulses.  Lymph: No lymphadenopathy of posterior or anterior cervical chain or axillae bilaterally.  Gait antalgic MSK:  tender with with limited range of motion but good stability and symmetric strength and tone of shoulders, elbows, wrist, hip, and ankles bilaterally.  Knee: Bilateral valgus deformity noted. Large thigh to calf ratio.  Tender to palpation over medial and PF joint line.  ROM full in flexion and extension and lower leg rotation. instability with valgus force.  painful patellar compression. Patellar glide with moderate crepitus. Patellar and quadriceps tendons unremarkable. Hamstring and quadriceps strength is normal.     Impression and Recommendations:     This case required medical decision making of moderate complexity.      Note: This dictation was prepared with Dragon dictation along with smaller phrase technology. Any transcriptional errors that result from this process are unintentional.

## 2017-08-19 ENCOUNTER — Ambulatory Visit (INDEPENDENT_AMBULATORY_CARE_PROVIDER_SITE_OTHER): Payer: Medicare HMO | Admitting: Family Medicine

## 2017-08-19 ENCOUNTER — Ambulatory Visit: Payer: Self-pay | Admitting: Internal Medicine

## 2017-08-19 ENCOUNTER — Encounter: Payer: Self-pay | Admitting: Family Medicine

## 2017-08-19 DIAGNOSIS — G4762 Sleep related leg cramps: Secondary | ICD-10-CM

## 2017-08-19 DIAGNOSIS — M170 Bilateral primary osteoarthritis of knee: Secondary | ICD-10-CM | POA: Diagnosis not present

## 2017-08-19 NOTE — Patient Instructions (Signed)
I am sorry on your knee.  Not a lot of options and only can go back to steroid injection  Check vitamins for calcium and discontinue Vitamin C I would decrease and try zinc 30 mg daily  Carbonated beverages 3 times a day could help cramps See me again in 3 months but do not have a lot of ideas to help

## 2017-08-20 ENCOUNTER — Telehealth: Payer: Self-pay | Admitting: Family Medicine

## 2017-08-20 ENCOUNTER — Telehealth: Payer: Self-pay | Admitting: Pulmonary Disease

## 2017-08-20 NOTE — Telephone Encounter (Signed)
Copied from [place] [id number]. Topic: Quick Communication - Rx Refill/Question >> Aug 20, 2017  1:23 PM Waylan Rocher, [place] L wrote: Medication: Pennsaid (was given a sample and she would like to continue with a new script)  Has the patient contacted their pharmacy? No. (Agent: If no, request that the patient contact the pharmacy for the refill.) (Agent: If yes, when and what did the pharmacy advise?)  Preferred Pharmacy (with phone number or street name): [facility], [place] - [address] [place] [address] Phone: [phone] Fax: [phone]  Agent: Please be advised that RX refills may take up to 3 business days. We ask that you follow-up with your pharmacy.  Patient is in a lot of pain.

## 2017-08-20 NOTE — Telephone Encounter (Signed)
Called and spoke to pt.  Pt stated she is experiencing leg/hand cramps during the night.  Pt seen Dr. Tamala Julian yesterday and was advised to drink carbonated water to help oxygen flow to limbs & take zinc.  Pt is wanting to know if SN agrees with these recommendations, or if these symptoms could be related to OSA. Pt stated she had sleep study previously and would prefer to not have to repeat sleep study again.  SN please advise. Thanks

## 2017-08-20 NOTE — Telephone Encounter (Signed)
Per SN- OK for her to try tonic water, 1 glass, QHS.  He had heard that CBD can be used for this.  Ok for Baclofen 10mg , #30, take 1 PO QHS. Called and spoke with Patient.  Gave her SN recommendations.  She stated that she had tried Baclofen before and did not want a prescription for that.  She is taking Turmeric already and it is not helping and uses CBD oil.  She stated that she is going to try tonic water and see if that will help.  She said she would call back next week and let us know if it has helped or not.

## 2017-08-20 NOTE — Assessment & Plan Note (Signed)
Continues to have nocturnal leg cramps.  We discussed with her home modalities that could be beneficial.  Laboratory work-up does not show anything significant but patient did have some iron deficiency and encouraged to take some.  Patient has been somewhat noncompliant with some of her other medications including the gabapentin.  Discussed with her that I would encourage her to do the medications that seem to be helping previously.

## 2017-08-20 NOTE — Assessment & Plan Note (Signed)
Continues to have significant amount of arthritic changes.  Patient does not want any steroid injections.  We discussed Visco supplementation again patient has decided not to do.  Discussed at this point only thing would be potential surgical intervention but patient is a high risk candidate.  Do not know anything else that can be done for her at this time other than custom bracing.  Return to clinic in 3 months

## 2017-08-24 NOTE — Telephone Encounter (Signed)
Spoke to pt yesterday. Advised pt insurance will not cover pennsaid & will provide her with samples if she would like. Pt understood.

## 2017-08-26 ENCOUNTER — Ambulatory Visit: Payer: Self-pay | Admitting: Internal Medicine

## 2017-08-26 ENCOUNTER — Telehealth: Payer: Self-pay | Admitting: Family Medicine

## 2017-08-26 NOTE — Telephone Encounter (Signed)
Copied from [place] [id number]. Topic: Quick Communication - See Telephone Encounter >> Aug 26, 2017 12:26 PM Ivar Drape wrote: CRM for notification. See Telephone encounter for: 08/26/17. Fara Olden w/[facility] [phone] said the patient stated the directions for the gabapentin (NEURONTIN) 100 MG capsule medication has changed. So they will need an updated prescription.

## 2017-08-27 NOTE — Telephone Encounter (Signed)
  Per Fara Olden w/[facility] [phone]... the patient stated the directions for the gabapentin (NEURONTIN) 100 MG capsule medication has changed. Requesting an updated prescription.

## 2017-08-30 MED ORDER — GABAPENTIN 300 MG PO CAPS: 300.0000 mg | ORAL_CAPSULE | Freq: Every day | ORAL | 1 refills | Status: DC

## 2017-08-30 NOTE — Telephone Encounter (Signed)
rx sent into pharmacy

## 2017-09-27 ENCOUNTER — Other Ambulatory Visit: Payer: Self-pay | Admitting: Pulmonary Disease

## 2017-10-12 DIAGNOSIS — H6123 Impacted cerumen, bilateral: Secondary | ICD-10-CM | POA: Diagnosis not present

## 2017-11-15 ENCOUNTER — Encounter: Payer: Self-pay | Admitting: Pulmonary Disease

## 2017-11-15 ENCOUNTER — Ambulatory Visit (INDEPENDENT_AMBULATORY_CARE_PROVIDER_SITE_OTHER): Payer: Medicare HMO | Admitting: Pulmonary Disease

## 2017-11-15 VITALS — BP 126/68 | HR 90 | Temp 98.4°F | Ht 65.0 in | Wt 258.8 lb

## 2017-11-15 DIAGNOSIS — D229 Melanocytic nevi, unspecified: Secondary | ICD-10-CM

## 2017-11-15 DIAGNOSIS — M150 Primary generalized (osteo)arthritis: Secondary | ICD-10-CM

## 2017-11-15 DIAGNOSIS — K589 Irritable bowel syndrome without diarrhea: Secondary | ICD-10-CM

## 2017-11-15 DIAGNOSIS — I872 Venous insufficiency (chronic) (peripheral): Secondary | ICD-10-CM | POA: Diagnosis not present

## 2017-11-15 DIAGNOSIS — F341 Dysthymic disorder: Secondary | ICD-10-CM

## 2017-11-15 DIAGNOSIS — K219 Gastro-esophageal reflux disease without esophagitis: Secondary | ICD-10-CM | POA: Diagnosis not present

## 2017-11-15 DIAGNOSIS — R7302 Impaired glucose tolerance (oral): Secondary | ICD-10-CM | POA: Diagnosis not present

## 2017-11-15 DIAGNOSIS — Z8542 Personal history of malignant neoplasm of other parts of uterus: Secondary | ICD-10-CM

## 2017-11-15 DIAGNOSIS — G4733 Obstructive sleep apnea (adult) (pediatric): Secondary | ICD-10-CM

## 2017-11-15 DIAGNOSIS — J9611 Chronic respiratory failure with hypoxia: Secondary | ICD-10-CM | POA: Diagnosis not present

## 2017-11-15 DIAGNOSIS — J449 Chronic obstructive pulmonary disease, unspecified: Secondary | ICD-10-CM

## 2017-11-15 DIAGNOSIS — M61339 Calcification and ossification of muscles associated with burns, unspecified forearm: Secondary | ICD-10-CM

## 2017-11-15 DIAGNOSIS — N2889 Other specified disorders of kidney and ureter: Secondary | ICD-10-CM

## 2017-11-15 DIAGNOSIS — M159 Polyosteoarthritis, unspecified: Secondary | ICD-10-CM

## 2017-11-15 MED ORDER — TRAMADOL HCL 50 MG PO TABS: 50.0000 mg | ORAL_TABLET | Freq: Three times a day (TID) | ORAL | 3 refills | Status: DC | PRN

## 2017-11-15 MED ORDER — MONTELUKAST SODIUM 10 MG PO TABS: 10.0000 mg | ORAL_TABLET | Freq: Every day | ORAL | 6 refills | Status: DC

## 2017-11-15 NOTE — Patient Instructions (Addendum)
Today we updated your med list in our EPIC system...    Continue your current medications the same...  We refilled your TRAMADOL prescription today...  We wrote a new prescription for SINGULAIR 10mg  one tab daily as a trial to see if it helps your breathing...  We will set up the requested referral:    1) to DERM to eval the spots on your left leg...    2) to UROLOGY to evaluate the calcifications seen in the right kidney & ureter...  I will research the Zephyr endobronchial valves to see if there is a local research center evaluating this option for treating emphysema...   Call for any questions...   Let's plan a follow up visit in 2-3 months so we can make plans for 2020 and beyond.Marland KitchenMarland Kitchen

## 2017-11-15 NOTE — Progress Notes (Addendum)
Patient ID: Rebecca [name], female   DOB: 1950-10-21, 67 y.o.   MRN: [id number]   Subjective:   HPI 67 y/o WF known to me w/ mult med problems as noted below...  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  LABS 3/15:  Chems- BS=95 A1c=6.8 (needs low carb diet, exercise, wt loss) & Creat=1.5 (mild renal insuffic due to Lasix rx; Rec to decrease Lasix40 to 1 tab Qam...  2DEcho 4/15 showed norm LV sys function w/ EF=60-65%, normal wall motion, Gr1DD, normal valves, normal RV & PA pressures...   she had Cards eval [name] 4/15> he did not feel that invasive testing was warranted; she had 2DEcho but refused Myoview due to co-pay costs...   She remains on ADVAIR500Bid, SPIRIVA daily, & ProairHFA prn (uses 3-4 x daily "It really helps"); has Home O2 but dislikes the Liq O2 therefore not using & she wants a portable O2 concentrator to read 4L/min w/ exercise, 2L/min at rest.   She has continued to research her supplements and herbal meds>  She tells me that she is living on smoothies she makes out of juice, ice, baking soda, pomegranate syrup, & local honey;  Tumeric helps the arthritis in her knees and she notes that pinching her upper lip in the middle under her nose helps relieve musc cramps in her legs.  Ambulatory O2 Test 9/15>  O2 sat on RA at rest= 92% w/ pulse=86;  Nadir O2sat on RA after 2 laps= 84% w/ pulse=96...   CXR 9/15 showed COPD/emphysema, no acute abnormality...  LABS 9/15:  Chems- ok w/ BS=119, A1c=6.7, Cr=1.4;  CBC- ok...  2/16 [facility] records indicated a thorough evaluation & excellent care provided but treatment plan was hampered by noncompliance- refused CPAP, declines chestPT, alternately stopping her O2 & demanding more O2,   LABS 2/16:  ABG- pH=7.46, pCO2=35, pO2=134 on 6L/min; Chems- ok x BS=169, A1c=6.5; CBC- normal; TSH=0.39 & FreeT4=1.54;  Troponin/ D-dimer/ BNP- all wnl...  CXR 2/16 showed heart at upper lim of norm, hyperinflation, min bibasilar atx, NAD.Marland KitchenMarland Kitchen   EKG 2/16 showed NSR, rate88, low voltage, otherw wnl...  2DEcho 2/16 showed norm LVF w/ EF=55-60%, Gr1DD, norm valves and norm RV...   CXR 2/16 showed heart at upper lim of norm, hyperinflation, min bibasilar atx, NAD  Spirometry 06/2014 showed FVC=1.61 (50%), FEV1=0.88 (35%), %1sec=55, mi-flows are reduced to 18% predicted... 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 CTChest 08/21/14 showed severe centrilobular emphysema, diffuse bronchial thickening, mild mucus plugging, no adenopathy, no lung nodules etc; also showed mild Ao & coronary calcif, lap band at GE junction, splenosis, DJD Tspine... PLAN> They decided to slowly wean the Medrol, continue  Advair & wean to Advair250Bid, continue Spiriva, continue NEBS & AlbutHFA; rec to continue her oxygen (but she continues to use it "prn"), consider repeat sleep study & CPAP titration, consider referral to [facility] re[facility], referred for nutritional counseling & ROV 7265mo..      She reports breathing well recently, she has a new roommate, eating out a lot, went to "the club", then one day over the weekend=> incr SOB, she's been taking Lasix80 Qam, rambling hx w/ flight of ideas regarding coffee, caffeine, not urinating enough, she [place] [facility] [place];  They decreased her Advair250-Bid, Medrol660md, continue Nebs (she's using Bid because she "cramps up" if she does it tid); she is awaiting nutritional counseling, pulm rehab referral, oxygen titration testing, sleep study...  We reviewed prob list, meds, xrays and labs> see below for updates >>  PLAN>>  She will maintain close contact & f/u w/ [facility];  Reminded to avoid sodium & carbs- handout given; plan ROV here in 2-23m42mo  ~  December 05, 2014:  265mo3265mo & [name]'s new PCP is in [place]> she persists w/ 4+somatic complaints and signif health related anxiety; [facility] "they kicked me out due to CP- I can't go back until I get this evaluated"; she's been seeing Dr. SaraGermain Osgoodlm Fellow w/ [facility]HaU[facility] PulmElizabethnic & I have reviewed all the Care Everywhere notes-- several telephone encounters including their last 45 min conversation well documented, not much reversibility, they hoped for small improvement w/ Rehab exercise & staying on meds but she won't fill rx, can't afford, tearful/ frustrated/ depressed but refused psyche consult etc... Pt indicates that DrStephens said there was nothing wrong w/ her heart & pt doesn't want cardiac eval; DrStephens wants her to wean the Medrol but she's breathing better on 4mg/51mshe wants 6mg/d5mrec to compromise w/ 6mg al32m/  4mg Qod2mhe wants a liq oxygen system again- she is quite adamant (being a former "nurse")Insurance claims handlershe needs 4-5L/min when active and tank only lasts 1-2H; she uses 4L/min Qhs as well; but O2sat=92% on RA at rest today; she refuses to go back to rehab, therefore encouraged to do it daily on her own; she has not pursued further bariatric considerations- she is working w/ Drlori's office regarding eating disorder "he doesn't think I'm depressed"...      IMP/PLAN>>  [name] has Artis Delayere airflow obstruction/ emphysema w/ little reversibility; Rec to take SYMBICORT160-2spBid & Spiriva daily; use Medrol 4mg tabs108mmg alt w823mmg Qod; X54mnex NEBS Tid;  Alprax 0.5mg - 1/2 t47m tab tid; ROV w/ me in 78mo...  ~  N84mober 7, 2016:  78mo ROV & pt 15momult complaints> head "stopped-up", ears congested, tried pseudophed but "it made me sick", doesn't want ENT f/u, rec to try Saline nasal mist & phenylephrine decongestant;  She is discouraged by her weight, can't  seem to lose any, she described in detail her meals from yest (we discussed calories "in" & "out", eat less, burn more), she says local Gym won't take her w/ oxygen;  She asked about Stem Cell treatment for COPD- I offered to send her to [facility] to inquire about this or lung transplant;  She is very frustrated- eating is an issue because when she fills up she can't breathe well due to her prev LapBand surg she says, if she belches she feels better...       Rec to continue Medrol64m- she incr herself to 6647md, Symbicort160-2spBid, Spiriva daily, Xopenex NEBS Tid (only doing it once/d), Guaifenesin400- taking 6/d w/ fluids, O2- she is not using it at rest, using 5L/min w/ exercise; she takes Lasix40, Tamadol50, & Xanax0.5 as needed...   LABS 01/2015>  Chems- ok x Cr=1.52, BS=114;  CBC- ok x wbc=17K w/ left shift;  TSH=1.17;  Fe=78 (22%sat);  VitB12=864...   XRay [facility] spine 01/2015>  Mild ant wedge compression L1, DDD & anterolithesis L4 on L5, poss right kidney stones...  ~   April 09, 2015:  47m52moV & [name] continues to complain about ever worsening breathing- DOE, dry cough, chest tightness, leg cramps, difficulty sleeping; notes good days and bad- "I got worked up over the election" she says; she thinks the zoloft is helping but she can't sleep w/ it- rec to try TRAZODONE instead (100m28m/2 to 1 tab Qhs);  Currently taking Oxygen that she adjusts herself, Medrol4mg/15mNEBS w/ Xopenex Tid (but she says insurance won't cover it), Symbicort160-2spBid, Spiriva daily, Lasix40 (just taking prn)... I suggested that she try the Oximizer=> given to pt to try...    See 08/2014 note above for summary of Pulm second opinion at [facility]  She saw CARDS at [facility]> reviewed in [facility] [place] w/ NSR, wnl, NAD; prev 2DEcho 04/2014 showed norm LVF w/ EF=55-60%, norm wall motion, G1DD, RVF was also wnl; she had some chest discomfort [facility] but pt never followed thru due to dyspnea & O2 requirements; they also considered RHC but she never returned as requested...    She has seen DrZSmith for SportsMed/ PM&R> c/o back pain & leg pain, his note is reviewed... EXAM shows Afeb, VSS, O2sat=91% on RA in office;  HEENT- nasal congestion, erythema, turbinate hypertrophy, dev septum, throat= Mallampati 2, sl red, no exudates or lesions;  Chest- decr BS bilat w/o w/r/r & no signs of consolidation;  Heart- RR w/o m/r/g;  Abd- Obese, panniculus, soft/ nontender;  Ext- VI w/ trace edema;  Psyche- very distraught...  Ambulatory Oximetry on 3L/min Lib[place] Negron Torres>  O2sat=96% on 3L/min at rest w/ HR=107/min;  She ambulated 1 Lap on 3L/min w/ nadir O2sat=89% w/ HR=125/min...  Ambulatory Oximetry on 3L/min w/ pendant oxymizer>  O2sat=99% on 3L/min at rest w/ pulse=98/min;  She ambulated one lap & stopped due to dyspnea w/ nadir O2sat=91% w/ HR=123/min... IMP/PLAN>>  She will try the Oximizer at home, switch from zoloft to TRAZODONE 100mg-45m to 1 tab Qhs, try to wean MEDROL  4mg is72mle to 4mg Qod9m increments; rov in 3 months time...  ~  Jul 09, 2015:  47mo ROV 647mol continues to complain about everything- productive cough w/ lots of clear sput, chr stable DOE w/ min activ, denies CP- says she's doing well w/ her NEB Rx;  After he last visit w/ prescribed an Oximizer but she says "no help at all"; we  tried Trazodone100 Qhs for sleep but she tells me she didn't need it; we discussed weaning down the dose of Medrol (she was on 26m/d) and she was able to diminish this to 450mod but symptoms flaired & she went right back up to 12m16mam again; she is taking gen Guaifenesin 400m56mbs- 8/d in divided doses + one gal fluids daily- this helps her cough & congestion;  Offered referral to [facility] for a 3rd opinion regarding her COPD and poss transplant eval but says she was told she had to lose 100# first ...  She wants a thorough THYROID & pituitary eval w/ "reverse T3" checked=> we will refer to Endocrine;  She says her hands are cramping- asked to try soaking hands in hot water but she has several reasons why she can't get to the sink...     Hx mild OSA w/ mod desat, loud snoring, & leg jerks (on sleep study 2005)> she tried CPAP10 but stopped on her own & declined to repeat split night study & re-try CPAP rx; she says she's sleeping satis & wakes feeling refreshed...    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 1993; supposed to be on HomeO2- 2L rest & 4L exercise, NEBS w/ Duoneb vs Xopenex Tid, Symbicort160-2spBid, Spiriva daily, Mucinex1-2Bid but not taking anything regularly; states that Flutter causes "spasms" in her back; states O2 doesn't help since she can't breathe thru her nose (she has been eval by ENT, CrosErnesto Rutherfordinally tried [facility] stopped after 34mo 3mofruitless"; prev stated she "can't breathe at all, can't do anything" etc; SOB w/ ADLs etc; Note> [facility] and [facility]- last PFT (2011) showed FEV1= 1.5 (55%) & %1sec=59 ==> 3/15 she announced that  she's been cured by taking a Magnesium supplement, along w/ herbal formula "Clear lungs" and "Lung tonic"; she had stopped her Oxygen;  Now using O2 up to 15L/min on her own since she can't breathe thru her nose- advised 2L/min rest & 4L/min exercise + humidity, nasal hygiene, etc; on MEDROL4-12mg/d42m  Hx AtypCP> baseline EKG w/ minor NSSTTWA; 2DEcho w/ norm LVF, EF=55-60%, Gr1DD, norm valves, norm LA/RV; Myoview 2009 normal w/o ischemia & EF=55%; she notes some atypCP & saw DrNishCherly Hensen she declined Myoview; [facility] refused to [facility] [place] until she'd had cardiac eval- she declined...    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    Marland KitchenMarland Kitchenypothyroid> not currently on meds but she really wants thyroid Rx; prev TFTs all wnl; she prev had Endocrine in [place] prescribe Synthroid for her but she stopped going; clinically & biochemically euthyroid; Labs 2/16 showed TSH=0.39 & FreeT4=1.54    Morbid Obesity> peak wt~340# before lap band surg 2009 by DrMMartin; lost to ~250# but then back up to 280-90#; refused f/u w/ [facility] due to cost of visits & lap-band adjustments; we reviewed diet & exercise- Wt betw 255-265#in last yr.    GI- GERD, IBS> she uses OTC PPI as needed; last colonoscopy was 1982 by DrPatterson & wnl, she refuses GI referral & refuses f/u colon etc...    GYN- s/p uterine cancer> s/p lap hyst & BSO  2007 by DrSkinner at [facility]...    DJD, ?FM, VitD defic> prev on Vicodin, Tramadol, Aleve, VitD; prev eval by DrDeveshwar for Rheum; now she states that [name] ("horse linament") applied topically has worked like a miracle & she has increased exercise etc...     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 EXAM shows Afeb, VSS, O2sat=91% on RA in office;  HEENT- nasal congestion, erythema, turbinate hypertrophy, dev septum, throat= Mallampati 2, sl red, no exudates or lesions;  Chest- decr BS bilat w/o w/r/r & no signs of consolidation;  Heart- RR w/o m/r/g;  Abd- Obese, panniculus, soft/ nontender;  Ext- VI w/ trace edema;  Psyche- very distraught...  SHE NEEDS F/U LABS IMP/PLAN>>  GArtis Delaywants an Endocrine eval & we will refer to endocrinologist;  Needs f/u labs but she wants the endocrine eval;  Advised to try to wean the Medrol to 480mQam;  She must lose the weight & then reassess & consider referral to [facility];  We [plan ROV in 71m59moeminded to bring al mmed bottles to every office visit.  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    She has mult somatic complaints> "I  have a weak back"; now on a diet- eating 1 meal/d & DrLurie is concerned that she isn't eating enough (wt up 5# to 271#); she says she has done her internet research & found a blogger who has COPD & improved w/ CBD oil (hemp oil) "it's pharmaceutical grade out of [place] contain any THC"; she places 20mg unde70mr tongue & it's supposed to help her lungs- says it's working as she has been able to wean down her oxygen she says "I'm keeping a diary"...    She saw [name] ENT 10/02/15> on CPAP, nasal septal deviation, cerumen impactions removed...     We reviewed her prescription meds>  InsHovnanian Enterprisescover xopenex/ levalbuterol; on Medrol 4mg- weane65merself down to 4-2-4-2 Qod, Symbicort160-2spBid, Spiriva daily, NEBS w/ Duoneb (but she has cut down to just once daily), Ventolin-HFA prn (she likes this & uses it 3-4x daily), Mucinex 600mg- uses 64m as needed EXAM shows Afeb, VSS, O2sat=88% on RA in office;  Wt=271#; HEENT- nasal congestion, erythema, turbinate hypertrophy, dev septum, Throat= Mallampati 2, sl red, no exudates or lesions;  Chest- decr BS bilat w/o w/r/r & no  signs of consolidation;  Heart- RR w/o m/r/g;  Abd- Obese, panniculus, soft/ nontender;  Ext- VI w/ trace edema;  Psyche- remains distraught... IMP/PLAN>>  She is excited about the CBD oil Rx off the internet & has weaned down her O2 and Medrol;  I pointed out her resting RA O2sat=88% today in the office 7 reminded to stay on her O2 regularly 2L/nmin rest 7 4L/min w/ exercise;  She has weaned her Medrol to 11m alt w/ 232mqod and anxious to go lower;  Reminded to stay on her regular med regimen all the time (see above)...   ~  February 10, 2016:  75m60moV & GilArtis Delayturns w/ mult somatic complaints- worse SOB but then tells me that her breathing is much better due to a special oil she is using, "I read on-line about NOT increasing oxygen when you are breathless", c/o decr hearing "it's coming from inside my head" & [facility]; [place] & we discussed the role of her hi flow O2- rec SALINE nasal mist frequently; pt called us [place]17 & said that her breathing was doing fantastic on Medrol reduced to 2mg275m, since then she has weaned OFF the Medrol on her own & notes "my lungs are dry & clear" and  "I solved my oxygen prob w/ long nasal prongs and saline spray"... HER CC TODAY IS A PROB w/ [facility] AS THEY WON'T SUPPLY HER w/ E-TANKS LIKE SHE WANTS THEM; we reviewed the following medical problems during today's office visit >>     Hx mild OSA w/ mod desat, loud snoring, & leg jerks (on sleep study 2005)> she tried CPAP10 but stopped on her own & declined to repeat split night study & re-try CPAP rx; she says she's sleeping satis & wakes feeling refreshed, does not want to re-assess her sleep...    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 1993; supposed to be on HomeO2- 2L rest & 4L exercise, NEBS w/ Duoneb Tid, Symbicort160-2spBid, Spiriva daily, GFN400- encouraged to use 2Tid w/ fluids; she stopped Medrol on her own recently; states that Flutter causes "spasms" in her back; states O2 doesn't help since she can't breathe thru her nose (she has been eval [facility]NT[facility] finally tried PulmRehab but stopped after 74mo 49mofruitless"; prev stated she "can't breathe at all, can't do anything" etc; SOB w/ ADLs etc; Note> [facility]- last PFT (2011) showed FEV1= 1.5 (55%) & %1sec=59 ==> 3/15 she announced that she's been cured by taking a Magnesium supplement, along w/ herbal formula "Clear lungs" and "Lung tonic"; she had stopped her Oxygen;  Now using O2 up to 15L/min on her own since she can't breathe thru her nose- advised 2L/min rest & 4L/min exercise + humidity, nasal hygiene, etc...     Hx AtypCP> baseline EKG w/ minor NSSTTWA; 2DEcho w/ norm LVF, EF=55-60%, Gr1DD, norm valves, norm LA/RV; Myoview 2009 normal w/o ischemia &  EF=55%; she notes some atypCP & saw DrNisCherly Hensen, she declined Myoview; [facility] refused to reinstate her in PulmRAccident in 2016 until she'd had cardiac eval- she declined...    Ven Insuffic> on Lasix40 (1or2 prn edema); she knows not to eat sodium, elev legs, wear support hose; Labs 2/16 showed Cr=1.2    Impaired Gluc Tolerance, Metabolic syndrome> she refused meds; prev eval by DrEllison w/ rec for Metformin but  she rejects DM diagnosis & refused meds; states "I'm eating healthier" (advised low carb low fat); Labs 2/16 showed BS~160, A1c=6.5.Marland KitchenMarland Kitchen    ?Hypothyroid> now followed by Dr.Smith in [place]; she is not on thyroid medication but she says he wants her off the Medrol so she weaned off this med on her own; prev TFTs all wnl; she prev had Endocrine in [place] prescribe Synthroid for her but she stopped going; clinically & biochemically euthyroid; Last Labs here 11/16 showed TSH=1.17 & FreeT4=1.05; Last note from [name] was 08/2015- we do not have further notes...    Morbid Obesity> peak wt~340# before lap band surg 2009 by DrMMartin; lost to ~250# but then back up to 280-90#; refused f/u w/ [facility] due to cost of visits & lap-band adjustments; we reviewed diet & exercise- Wt betw 255-265# in 2017...    GI- GERD, IBS> she uses OTC PPI as needed; last colonoscopy was 1982 by DrPatterson & wnl, she refuses GI referral & refuses f/u colon etc...    GYN- s/p uterine cancer> s/p lap hyst & [facility]SO [facility]...    DJD, ?FM, VitD defic> prev on Vicodin, Tramadol, Aleve, VitD; prev eval by DrDeveshwar for Rheum; now she states that [name] ("horse linament") applied topically has worked like a miracle & she has increased exercise etc; she has mild ant wedging of L1 on XRay + DDD & degen facet joint changes...    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 EXAM shows Afeb, VSS, O2sat=93% on RA in office;  HEENT- sl nasal congestion, erythema, turbinate hypertrophy, dev septum, throat= Mallampati 2, sl red, no exudates or lesions;  Chest- decr BS bilat w/o w/r/r & no signs of consolidation;  Heart- RR w/o m/r/g;  Abd- Obese, panniculus, soft/ nontender;  Ext- VI w/ trace edema...  LABS 02/10/16>  Chems- ok w/ BS=105, BUN=25, Cr=1.24 IMP/PLAN>>  Overall GArtis Delayis stable- she requests 90d refill prescriptions and we did this;  We reviewed her labs, XRays, & PFTs- rec to take meds regularly & not adjust on her own, rec to return to exercise program & consider pulm rehab; note: >50% of this 25 min f/u appt was spent on counseling & coordination of care...   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    [facility]  Chr nasal obstruction L>R (rec humidification & saline, Flonase, & breathe-right nasal strips) & hearing loss=> cerumen impactions removed & audiogram revealed hi freq hearing  loss.    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 1993; supposed to be on HomeO2 at 2-3L rest & 4+L exercise, NEBS w/ Duoneb Tid, Symbicort160-2spBid, Spiriva daily, GFN400- encouraged to use 2Tid w/ fluids; she stopped Medrol on her own;  states that Flutter causes "spasms" in her back; states O2 doesn't help since she can't breathe thru her nose (she has been eval by ENT- [place]); finally tried PulmRehab but stopped after 67moas "fruitless"; prev stated she "can't breathe at all, can't do anything" etc; SOB w/ ADLs etc; Note> [facility]- last PFT (2011) showed FEV1= 1.5 (55%) & %1sec=59 ==> 3/15 she announced that she's been cured by taking a Magnesium supplement, along w/ herbal formula "Clear lungs" and "Lung tonic"; she had stopped her Oxygen;  Now using O2 up to 15L/min on her own since she can't breathe thru her nose- advised 2L/min rest & 4L/min exercise + humidity, nasal hygiene, etc;  Recently noted that her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 RV & PA pressures... ~  EKG 2/16 showed NSR, rate88, low voltage, otherw wnl... ~  2DEcho 2/16 showed norm LVF w/ EF=55-60%, Gr1DD, norm valves and norm RV.  VENOUS INSUFFICIENCY, CHRONIC (ICD-459.81) - Hx chr ven insuffic w/ edema... on LASIX 53m- 1-2 daily (she self medicates)... ~  Labs 3/15 showed Cr=1.5 and she is asked to decr the Lasix to 433mQam...  IMPAIRED GLUCOSE TOLERANCE >>  METABOLIC SYNDROME X (ICJKQ-206.0- she is on diet alone (refused Metformin therapy)... ~  labs 9/08 showed BS=120... FBS 5/08 was 93, HgA1c=7.1 ~  labs 5/10 showed BS= 182, A1c= 6.4 ~  labs 2/11 showed BS= 76, A1c= 6.5 ~  Labs 5/12 showed BS= 103, A1c= 6.5 ~  She saw DrEllison 4/13 w/ A1c=7.1 but she rejects the diagnosis of Diabetes & refused Metform50062m... ~  10/13:  We discussed IMPAIRED GLUCOSE TOLERANCE & need for low carb wt reducing diet and incr exercise... 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 ~  max weight ~340# before lap band surg 7/09 by DrMartin. ~  weight 12/09 = 291# ~  weight 5/10 = 265# ~  weight 11/10- = 254# ~  weight 2/11 = 260# ~  Weight 5/12= 290# ~  Weight 11/12 = 286# ~  Weight 4/13 = 280# => BMI=45 ~  Weight 10/13 = 289# ~  Weight 9/14 = 281# ~  Weight 3/15 = 270# ~  Weight 9/15 = 267# ~  Weight 12/15 = 274# ~  Weight 3/16 = 255# => post hosp... ~  Weight 4/16 = 271# ~  Weight 7/16 = 272# ~  Weight 11/16 = 276#  ? Hx HYPOTHYROIDISM (ICD-244.9) -  off thyroid meds since 1/10... she was started on Synthroid and followed by DrRSmith in [place] & last seen 7/09- note reviewed... she refuses f/u by [name]- we will recheck TSH off Synthroid Rx... ~  labs 5/10 off Synthroid since 1/10 showed TSH= 1.13 ~  labs 2/11 showed TSH= 1.82 ~  Labs 5/12 showed TSH= 1.81 ~  2013: She wanted to restart Thyroid hormone but TSH remains normal> referred to Endocrine for further eval but she was upset w/ DrEllison's eval; she will decide if she wants to pursue another opinion... ~  9/14: not currently on meds but she really wants thyroid Rx; prev TFTs all wnl; she prev had Endocrine in [place] prescribe Synthroid for her but she stopped going; clinically & biochemically euthyroid...  ~  3/15: on Synthroid100-12 tab daily; Labs showed TSH= 3.00 ~  She stopped the Synthroid again... ~  Labs 2/16 not on meds showed TSH=0.39 & FreeT4=1.54   GERD (ICD-530.81) - prev on Nexium but off all meds since 1/10... IRRITABLE BOWEL SYNDROME (ICD-564.1) - colonoscopy 1982 by DrPatterson was WNL... She has refused f/u GI eval & f/u colonoscopy...  ?KIDNEY STONE Hx >> pt said she passed a kidney stone 3/11 w/o any medical attention, w/o work up or proof of same; entry made here for future reference if needed.  UTERINE CANCER, HX OF (ICD-V10.42) - s/p laparoscopic hysterectomy & BSO 7/07 by DrSkinner at [facility]...   DEGENERATIVE JOINT DISEASE (ICD-715.90) - on VICODIN tid prn,  ANAPROX 283m Bid prn, ULTRAM 557mPrn... ? 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 she was on Wellbutrin & Vicodin when last seen in 2006... VITAMIN D DEFICIENCY (ICD-268.9) - Vit D level 5/10 = 16... rec> start OTC Vit D 2000 u daily. ~  9/14: on Vicodin, Tramadol, Aleve, VitD, etc; prev eval by DrDeveshwar for rheum; now c/o pain in left knee (she thinks it's "siatica"), can't exercise & wants to see [facility]- ok... ~   3/15: she states that topical [name] ("horse linament") rubbed into knees really helps but she wants to keep the Vicodinon hand just in case...  Hx of HEADACHE (ICD-784.0)  DYSTHYMIA (ICD-300.4) - off Celexa,  off Zoloft, and on ALPRAZOLAM 0.33m30md as needed... she has seen psychiatry in the past [place]x w/ seasonal  affective disorder... prev seeing psychologist [name] for eating disorder counselling... ~  4/13: she mentioned seeing [name] again for eating disorder & counseling for depression etc... ~  9/14: on Alprazolam 0.65m tid prn; prev eval by Psychiatry w/ seasonal affective disorder; still sees Psychology [name] re: eating disorder; under considerable stress due to relationship w/ sister, financial pressure, etc...  ~  3/15: she has the Alpraz0.523mfor prn use but seldom uses it; still sees [name] on occas but doesn't think she needs to continue... ~  11/15:  She wants antidepressant med & we discussed Zoloft tria 10026m1/2 => 1 tab daily... She still sees Psychologist [name]. ~  3/16:  She is off the Zoloft & states the AlpSharin Grave too strong (advised cutting the med & try lower dose for the desired effect...  HEALTH MAINTENANCE:   ~  GI:  Per seen by DrPatterson but last colon was 1982 & she refuses follow up... ~  GYN:  [facility] Immuniz:  She had Pneumovax in the past but declines further immuniz- declines Flu shots and Tetanus shots...   Past Surgical History:  Procedure Laterality Date  . APPENDECTOMY    . BILATERAL SALPINGOOPHORECTOMY  2007   [facility]  . CHOLECYSTECTOMY  1982  . LAPAROSCOPIC GASTRIC BANDING  08/2007   Dr. MarHassell Done LAPAROSCOPIC HYSTERECTOMY  2007   [facility]  . SPLENECTOMY  at [facility]   d/t trauma    Outpatient Encounter Medications as of 11/15/2017  Medication Sig  . albuterol (VENTOLIN HFA) 108 (90 Base) MCG/ACT inhaler INHALE 2 PUFFS INTO THE LUNGS EVERY 6 HOURS AS NEEDED FOR  WHEEZING OR SHORTNESS OF BREATH  . ALPRAZolam (XANAX) 0.5 MG tablet TAKE 1/2 TO 1 TABLET BY MOUTH 3 TIMES DAILY AS NEEDED FOR NERVES. MAX DAILY DOSE = 3 TABLETS  . B Complex-C-Folic Acid (B-COMPLEX BALANCED) TABS Take 4 tablets by mouth 2 (two) times daily.   . baclofen (LIORESAL) 10 MG tablet Take 10 mg by mouth. nightly  . cetirizine (ZYRTEC) 10 MG tablet Take 10 mg by mouth daily.    . Cholecalciferol (VITAMIN D-3) 5000 UNITS TABS Take 2 tablets by mouth daily.   . colchicine (COLCRYS) 0.6 MG tablet Take 1 tablet (0.6 mg total) by mouth daily. (Patient taking differently: Take 0.6 mg by mouth daily. As needed)  . fluticasone (FLONASE) 50 MCG/ACT nasal spray Place 2 sprays into both nostrils daily.  . furosemide (LASIX) 40 MG tablet TAKE 1-2 TABLETS ONCE A DAY AS NEEDED FOR SWELLING  . Gabapentin (NEURONTIN PO) Take 500 mg by mouth at bedtime.  . gMarland Kitchenucosamine-chondroitin 500-400 MG tablet Take 1 tablet by mouth 2 (two) times daily.   . gMarland KitchenaiFENesin (MUCINEX) 600 MG 12 hr tablet Take 1,600 mg by mouth 2 (two) times daily.  . iMarland Kitchenratropium-albuterol (DUONEB) 0.5-2.5 (3) MG/3ML SOLN Take 3 mLs by nebulization 3 (three) times daily.  . Magnesium 400 MG CAPS Take 3 capsules by mouth 2 (two) times daily.   . naproxen sodium (ANAPROX) 220 MG tablet Take 220 mg by mouth daily. As needed  . NON FORMULARY Mullin herbal supplement- take 1 tab BID  . OXYGEN Inhale 4 L into the lungs.  . SYMBICORT 160-4.5 MCG/ACT inhaler INHALE 2 PUFFS INTO THE LUNGS 2 TIMES DAILY  . tiotropium (SPIRIVA HANDIHALER) 18 MCG inhalation capsule Place 1 capsule (18 mcg total) into inhaler and inhale daily.  . traMADol (ULTRAM) 50 MG tablet  Take 1 tablet (50 mg total) by mouth 3 (three) times daily as needed for moderate pain.  . traZODone (DESYREL) 150 MG tablet Take 1 tablet (150 mg total) by mouth at bedtime as needed for sleep. (Patient taking differently: Take 50 mg by mouth at bedtime as needed for sleep. )  . TURMERIC PO Take  400 mg by mouth 2 (two) times daily.  . vitamin A 10000 UNIT capsule Take 5,000 Units by mouth daily.   . vitamin C (ASCORBIC ACID) 250 MG tablet Take 250 mg by mouth daily.  . vitamin E 100 UNIT capsule Take by mouth daily.  . [DISCONTINUED] gabapentin (NEURONTIN) 100 MG capsule Take 2 capsules (200 mg total) by mouth at bedtime. (Patient taking differently: Take by mouth at bedtime. )  . [DISCONTINUED] gabapentin (NEURONTIN) 300 MG capsule Take 1 capsule (300 mg total) by mouth at bedtime.  . [DISCONTINUED] traMADol (ULTRAM) 50 MG tablet Take 1 tablet (50 mg total) by mouth 3 (three) times daily as needed.  . montelukast (SINGULAIR) 10 MG tablet Take 1 tablet (10 mg total) by mouth at bedtime.   No facility-administered encounter medications on file as of 11/15/2017.     Allergies  Allergen Reactions  . Penicillins     REACTION: nausea and vomiting    She repeatedly refuses all indicated & recommended adult immunizations...    Current Medications, Allergies, Past Medical History, Past Surgical History, Family History, and Social History were reviewed in [employer] record.   Review of Systems:         See HPI - all other systems neg except as noted... The patient complains of dyspnea on exertion and difficulty walking; chest discomfort, & leg weakness.  The patient denies anorexia, fever, weight loss, weight gain, vision loss, decreased hearing, hoarseness, syncope, prolonged cough, headaches, hemoptysis, abdominal pain, melena, hematochezia, severe indigestion/heartburn, hematuria, incontinence, suspicious skin lesions, transient blindness, depression, unusual weight change, abnormal bleeding, enlarged lymph nodes, and angioedema.    Objective:   Physical Exam:    WD, Morbidly Obese, 67 y/o WF in no acute distress but c/o dyspnea/ wheezing... GENERAL:  Alert & oriented; pleasant & cooperative... HEENT:  [place]/AT, EOM-wnl, PERRLA, EACs-clear, TMs-wnl,  NOSE-clear, THROAT-clear & wnl. NECK:  Supple w/ fairROM; no JVD; normal carotid impulses w/o bruits; no thyromegaly or nodules palpated; no lymphadenopathy. CHEST:  no accessory musc use, decr BS bilat, no wheezing/ rales/ rhonchi... HEART:  Regular Rhythm; without murmurs/ rubs/ or gallops heard... ABDOMEN:  Obese, soft & nontender; normal bowel sounds; no organomegaly or masses detected. EXT: without deformities, mod arthritic changes; no varicose veins/ +venous insuffic/trace edema. NEURO:  CNs intact; no focal neuro deficits... DERM: few ecchymoses, no rash etc...  RADIOLOGY DATA:  Reviewed in the EPIC EMR & discussed w/ the patient...  LABORATORY DATA:  Reviewed in the EPIC EMR & discussed w/ the patient...   Assessment & Plan:    ENT>> she was working w/ DrPlonk at [employer] in [place]...  OSA>  As prev noted she needs new sleep study & appropriate new CPAP apparatus to facilitate compliance but she declines the repeat study or sleep med referral...  COPD/ Emphysema>  Acute exac 2/16 improved after hosp in [place] w/ Levaquin, Pred taper, NEBS Bid, Avair500, Spiriva, Mucinex etc... Encouraged to stay on her O2 regularly & we will add Humidity, needs max nasal hygiene vs ENT follow up; continue NEBS/ Advair500/ Spiriva regularly & use the Mucinex; we will request the [facility]  [facility] home COPD program; needs to lose wt & incr exercise... 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 5/16>  She has seen ENT & Pulm 2nd opinion is pending; she cannot manipulate the port O2 tanks and we will request an INOGEN One- port O2 concentrator for her use...  5/16>  States breathing worse, asked to use all regular meds regularly & we will add Depo120, Medrol taper... 6/16>  She had 2nd opinion consult w/ DrStephens/ DrHaponik at [facility] see above... 10/16>  Pt  very frustrated after working w/ the [employer] division at [employer] for several months- Nebs, Advair500, Spiriva, Medrol, Oxygen, pulm rehab, etc...  11/16>  On Medrol57m- she incr herself to 628md, Symbicort160-2spBid, Spiriva daily, Xopenex NEBS Tid (only doing it once/d), Guaifenesin400- taking 6/d w/ fluids, O2- she is not using it at rest, using 5L/min w/ exercise; she takes Lasix40, Tamadol50, & Xanax0.5 as needed... 8/17>  She is now taking an internet supplement CBD oil (hemp oil) under her tongue daily- "it's good for your lungs" & she has weaned down her O2 and Medrol; Advised to continue O2- 2L/min at rest & 4L/min w/ exercise and take her other meds regularly...  02/10/16>   Overall GiArtis Delays stable- she requests 90d refill prescriptions and we did this;  We reviewed her labs, XRays, & PFTs- rec to take meds regularly & not adjust on her own, rec to return to exercise program & consider pulm rehab. 04/16/16>    I wrote a prescription for a POC at her insistence;  We will check w/ other DME companies for her;  She is rec to continue the NEB w/ Duoneb tid followed by the Symbicort160-2spBid & the Spiriva once daily;  Maximize the GUAIFENESIN 40091mabs- 2Tid w/ fluids & work to expectorate the phlegm;  She still needs a regular exercise program/ pulm rehab & we discussed this... we plan rov in [id number]/18>   I have asked MsCook to be regular w/ her Duoneb Tid, Symbicort160-2spBid & spiriva once daily;  Ok to use her supplements and "special oil" that she feels has been very beneficial to her in many ways... we plan ROV recheck in 56mon83month19/18>   She is stable and credits the CBD oil she is using; recommended to use her inhalers regularly including- O2 at prescribed, DUONEB-Tid, Symbicort-Bid, Spiriva daily, GFN400-2Tid w/ fluids, ProventilHFA as needed;  She refuses the FLU vaccine... 03/16/17>   [name] iArtis Delaytable on her FECO oil but I have reminded her to STAY on her O2, DUONEB Tid, Symbicort Bid, Spiriva  daily, & Mucinex, fluids, etc;  CXR & LABS look good...  07/15/17>   [name] iArtis Delayeminded to follow the prescribed regimen-- O2 at 2L/min rest & 4L/min exerc; NEB w/ DUONEB TID, SYMBICORT160-2spBid, SPIRIVA once daily, AlbutHFA rescue as needed; MUCINEX 1200mg 42m fluids, Zyrtek & Flonase;  She has a long list of supplement medications that she ascribes to including FECO oil... I have nothing else to offer her at this point. 11/15/17>   [name] isArtis Delayked to continue her NEB w/ DUONEB Tid regularly, followed by Symbicort160- 2spBid & Spirva handihaler once daily; she has home O2, Mucinex, Zyrtek, Flonase;  We are adding the Singulair 10mg Q55m a trial;  I told her we would look into the interventional Zephyr valve therapy;  In the meanwhile we will  set up appts w/ Urology & Dermatology per her requests...  I indicated to her that I would be retiring at the end of the yr & she requested one additional visit in Dec2019   Hx CP w/ neg cardiac evals including prev Myoviews; 2DEchos w/o incr PA pressures... 4/15> she had Cardiology consult for eval of AtypCP [name]); EKG was WNL & 2DEcho showed norm LVF, Gr1DD & norm RV & PA pressures... 2/16> repeat [facility]cho in [facility] showed similar good LVF, valves and norm RV...  OBESITY>  She had remote lap band surg by DrMMartin; Met syndrome, must get wt down etc; she is a lap band failure & prev counseling by [name]... 3/16> weight down to 255# post hosp... 4/16> weight is back up to 271# despite all efforts... 8/17> weight = 271#  ? Hx Hypothy>  TFTs remain normal off meds- no problem... 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  GI>  GERD> she denies symptoms & uses OTC meds prn...  DJD>  She has DJD, ?FM, etc;  On Vicodin, Tramadol, OTC NSAIDs  as needed... C/O right ankle pain, eval by DrZSmith> no better on his anti-inflamm rx so we will Rx w/ Depo120 & Medrol taper... Neck pain followed by Orson Aloe & DrNewton...  Dysthymia>  Much stress in her life, on Alpraz prn; has embraced alt lifestyle...   Patient's Medications  New Prescriptions   MONTELUKAST (SINGULAIR) 10 MG TABLET    Take 1 tablet (10 mg total) by mouth at bedtime.  Previous Medications   ALBUTEROL (VENTOLIN HFA) 108 (90 BASE) MCG/ACT INHALER    INHALE 2 PUFFS INTO THE LUNGS EVERY 6 HOURS AS NEEDED FOR WHEEZING OR SHORTNESS OF BREATH   ALPRAZOLAM (XANAX) 0.5 MG TABLET    TAKE 1/2 TO 1 TABLET BY MOUTH 3 TIMES DAILY AS NEEDED FOR NERVES. MAX DAILY DOSE = 3 TABLETS   B COMPLEX-C-FOLIC ACID (B-COMPLEX BALANCED) TABS    Take 4 tablets by mouth 2 (two) times daily.    BACLOFEN (LIORESAL) 10 MG TABLET    Take 10 mg by mouth. nightly   CETIRIZINE (ZYRTEC) 10 MG TABLET    Take 10 mg by mouth daily.     CHOLECALCIFEROL (VITAMIN D-3) 5000 UNITS TABS    Take 2 tablets by mouth daily.    COLCHICINE (COLCRYS) 0.6 MG TABLET    Take 1 tablet (0.6 mg total) by mouth daily.   FLUTICASONE (FLONASE) 50 MCG/ACT NASAL SPRAY    Place 2 sprays into both nostrils daily.   FUROSEMIDE (LASIX) 40 MG TABLET    TAKE 1-2 TABLETS ONCE A DAY AS NEEDED FOR SWELLING   GABAPENTIN (NEURONTIN PO)    Take 500 mg by mouth at bedtime.   GLUCOSAMINE-CHONDROITIN 500-400 MG TABLET    Take 1 tablet by mouth 2 (two) times daily.    GUAIFENESIN (MUCINEX) 600 MG 12 HR TABLET    Take 1,600 mg by mouth 2 (two) times daily.   IPRATROPIUM-ALBUTEROL (DUONEB) 0.5-2.5 (3) MG/3ML SOLN    Take 3 mLs by nebulization 3 (three) times daily.   MAGNESIUM 400 MG CAPS    Take 3 capsules by mouth 2 (two) times daily.    NAPROXEN SODIUM (ANAPROX) 220 MG TABLET    Take 220  mg by mouth daily. As needed   NON FORMULARY    Mullin herbal supplement- take 1 tab BID   OXYGEN    Inhale 4 L into the lungs.   SYMBICORT 160-4.5 MCG/ACT  INHALER    INHALE 2 PUFFS INTO THE LUNGS 2 TIMES DAILY   TIOTROPIUM (SPIRIVA HANDIHALER) 18 MCG INHALATION CAPSULE    Place 1 capsule (18 mcg total) into inhaler and inhale daily.   TRAZODONE (DESYREL) 150 MG TABLET    Take 1 tablet (150 mg total) by mouth at bedtime as needed for sleep.   TURMERIC PO    Take 400 mg by mouth 2 (two) times daily.   VITAMIN A 76720 UNIT CAPSULE    Take 5,000 Units by mouth daily.    VITAMIN C (ASCORBIC ACID) 250 MG TABLET    Take 250 mg by mouth daily.   VITAMIN E 100 UNIT CAPSULE    Take by mouth daily.  Modified Medications   Modified Medication Previous Medication   TRAMADOL (ULTRAM) 50 MG TABLET traMADol (ULTRAM) 50 MG tablet      Take 1 tablet (50 mg total) by mouth 3 (three) times daily as needed for moderate pain.    Take 1 tablet (50 mg total) by mouth 3 (three) times daily as needed.  Discontinued Medications   GABAPENTIN (NEURONTIN) 100 MG CAPSULE    Take 2 capsules (200 mg total) by mouth at bedtime.   GABAPENTIN (NEURONTIN) 300 MG CAPSULE    Take 1 capsule (300 mg total) by mouth at bedtime.

## 2017-11-16 ENCOUNTER — Telehealth: Payer: Self-pay | Admitting: Pulmonary Disease

## 2017-11-16 ENCOUNTER — Other Ambulatory Visit: Payer: Self-pay | Admitting: Family Medicine

## 2017-11-16 NOTE — Telephone Encounter (Signed)
Called and spoke to [place], Software engineer at [employer]. [employer] was requesting reason for Tramadol, not Xanax.  Tramadol 50mg  is used as needed for moderate pain.  Nothing further at this time.

## 2017-11-16 NOTE — Telephone Encounter (Signed)
Returned call to [place] for Ruby [employer], pharmacist.  Medication is question was Tramadol, not Xa

## 2017-11-16 NOTE — Telephone Encounter (Signed)
Called and spoke to Rebecca [name] with [employer]. Ruby Cola is requesting reasoning of prescribing Xanax for insurance.  SN please advise.   Current Outpatient Medications on File Prior to Visit  Medication Sig Dispense Refill  . albuterol (VENTOLIN HFA) 108 (90 Base) MCG/ACT inhaler INHALE 2 PUFFS INTO THE LUNGS EVERY 6 HOURS AS NEEDED FOR WHEEZING OR SHORTNESS OF BREATH 54 each 4  . ALPRAZolam (XANAX) 0.5 MG tablet TAKE 1/2 TO 1 TABLET BY MOUTH 3 TIMES DAILY AS NEEDED FOR NERVES. MAX DAILY DOSE = 3 TABLETS 90 tablet 5  . B Complex-C-Folic Acid (B-COMPLEX BALANCED) TABS Take 4 tablets by mouth 2 (two) times daily.     . baclofen (LIORESAL) 10 MG tablet Take 10 mg by mouth. nightly    . cetirizine (ZYRTEC) 10 MG tablet Take 10 mg by mouth daily.      . Cholecalciferol (VITAMIN D-3) 5000 UNITS TABS Take 2 tablets by mouth daily.     . colchicine (COLCRYS) 0.6 MG tablet Take 1 tablet (0.6 mg total) by mouth daily. (Patient taking differently: Take 0.6 mg by mouth daily. As needed) 30 tablet 11  . fluticasone (FLONASE) 50 MCG/ACT nasal spray Place 2 sprays into both nostrils daily.    . furosemide (LASIX) 40 MG tablet TAKE 1-2 TABLETS ONCE A DAY AS NEEDED FOR SWELLING 60 tablet 11  . Gabapentin (NEURONTIN PO) Take 500 mg by mouth at bedtime.    Marland Kitchen glucosamine-chondroitin 500-400 MG tablet Take 1 tablet by mouth 2 (two) times daily.     Marland Kitchen guaiFENesin (MUCINEX) 600 MG 12 hr tablet Take 1,600 mg by mouth 2 (two) times daily.    Marland Kitchen ipratropium-albuterol (DUONEB) 0.5-2.5 (3) MG/3ML SOLN Take 3 mLs by nebulization 3 (three) times daily. 270 mL 7  . Magnesium 400 MG CAPS Take 3 capsules by mouth 2 (two) times daily.     . montelukast (SINGULAIR) 10 MG tablet Take 1 tablet (10 mg total) by mouth at bedtime. 30 tablet 6  . naproxen sodium (ANAPROX) 220 MG tablet Take 220 mg by mouth daily. As needed    . NON FORMULARY Mullin herbal supplement- take 1 tab BID    . OXYGEN Inhale 4 L into the lungs.    . SYMBICORT  160-4.5 MCG/ACT inhaler INHALE 2 PUFFS INTO THE LUNGS 2 TIMES DAILY 1 Inhaler 5  . tiotropium (SPIRIVA HANDIHALER) 18 MCG inhalation capsule Place 1 capsule (18 mcg total) into inhaler and inhale daily. 30 capsule 11  . traMADol (ULTRAM) 50 MG tablet Take 1 tablet (50 mg total) by mouth 3 (three) times daily as needed for moderate pain. 90 tablet 3  . traZODone (DESYREL) 150 MG tablet Take 1 tablet (150 mg total) by mouth at bedtime as needed for sleep. (Patient taking differently: Take 50 mg by mouth at bedtime as needed for sleep. ) 30 tablet 1  . TURMERIC PO Take 400 mg by mouth 2 (two) times daily.    . vitamin A 10000 UNIT capsule Take 5,000 Units by mouth daily.     . vitamin C (ASCORBIC ACID) 250 MG tablet Take 250 mg by mouth daily.    . vitamin E 100 UNIT capsule Take by mouth daily.     No current facility-administered medications on file prior to visit.     Allergies  Allergen Reactions  . Penicillins     REACTION: nausea and vomiting

## 2017-12-20 ENCOUNTER — Other Ambulatory Visit: Payer: Self-pay | Admitting: Pulmonary Disease

## 2018-01-03 ENCOUNTER — Telehealth: Payer: Self-pay | Admitting: Pulmonary Disease

## 2018-01-03 DIAGNOSIS — J449 Chronic obstructive pulmonary disease, unspecified: Secondary | ICD-10-CM

## 2018-01-03 NOTE — Telephone Encounter (Signed)
lmtcb for pt.  

## 2018-01-04 ENCOUNTER — Other Ambulatory Visit: Payer: Self-pay | Admitting: *Deleted

## 2018-01-04 DIAGNOSIS — N200 Calculus of kidney: Secondary | ICD-10-CM | POA: Diagnosis not present

## 2018-01-04 DIAGNOSIS — G4762 Sleep related leg cramps: Secondary | ICD-10-CM

## 2018-01-04 DIAGNOSIS — M4722 Other spondylosis with radiculopathy, cervical region: Secondary | ICD-10-CM

## 2018-01-04 NOTE — Telephone Encounter (Signed)
Called and spoke to pt. Pt states she has rescheduled Zephyr Valves . Pt would like to be referred to [facility] for this. [facility]'s fax number to [phone].  Dr. Lenna Gilford please advise. Thanks  Current Outpatient Medications on File Prior to Visit  Medication Sig Dispense Refill  . albuterol (VENTOLIN HFA) 108 (90 Base) MCG/ACT inhaler INHALE 2 PUFFS INTO THE LUNGS EVERY 6 HOURS AS NEEDED FOR WHEEZING OR SHORTNESS OF BREATH 54 each 4  . ALPRAZolam (XANAX) 0.5 MG tablet TAKE 1/2 TO 1 TABLET BY MOUTH 3 TIMES DAILY AS NEEDED FOR NERVES. MAX DAILY DOSE = 3 TABLETS 90 tablet 5  . B Complex-C-Folic Acid (B-COMPLEX BALANCED) TABS Take 4 tablets by mouth 2 (two) times daily.     . baclofen (LIORESAL) 10 MG tablet Take 10 mg by mouth. nightly    . cetirizine (ZYRTEC) 10 MG tablet Take 10 mg by mouth daily.      . Cholecalciferol (VITAMIN D-3) 5000 UNITS TABS Take 2 tablets by mouth daily.     . colchicine (COLCRYS) 0.6 MG tablet Take 1 tablet (0.6 mg total) by mouth daily. (Patient taking differently: Take 0.6 mg by mouth daily. As needed) 30 tablet 11  . fluticasone (FLONASE) 50 MCG/ACT nasal spray Place 2 sprays into both nostrils daily.    . furosemide (LASIX) 40 MG tablet TAKE 1-2 TABLETS ONCE A DAY AS NEEDED FOR SWELLING 60 tablet 11  . Gabapentin (NEURONTIN PO) Take 500 mg by mouth at bedtime.    . gabapentin (NEURONTIN) 100 MG capsule TAKE TWO CAPSULES BY MOUTH EVERY NIGHT AT BEDTIME 60 capsule 2  . glucosamine-chondroitin 500-400 MG tablet Take 1 tablet by mouth 2 (two) times daily.     Marland Kitchen guaiFENesin (MUCINEX) 600 MG 12 hr tablet Take 1,600 mg by mouth 2 (two) times daily.    Marland Kitchen ipratropium-albuterol (DUONEB) 0.5-2.5 (3) MG/3ML SOLN Take 3 mLs by nebulization 3 (three) times daily. 270 mL 7  . Magnesium 400 MG CAPS Take 3 capsules by mouth 2 (two) times daily.     . montelukast (SINGULAIR) 10 MG tablet Take 1 tablet (10 mg total) by mouth at bedtime. 30 tablet 6  . naproxen sodium (ANAPROX) 220 MG  tablet Take 220 mg by mouth daily. As needed    . NON FORMULARY Mullin herbal supplement- take 1 tab BID    . OXYGEN Inhale 4 L into the lungs.    . SPIRIVA HANDIHALER 18 MCG inhalation capsule PLACE 1 CAPSULE INTO INHALER AND INHALE INTO LUNGS DAILY 90 capsule 10  . SYMBICORT 160-4.5 MCG/ACT inhaler INHALE 2 PUFFS INTO THE LUNGS 2 TIMES DAILY 1 Inhaler 5  . traMADol (ULTRAM) 50 MG tablet Take 1 tablet (50 mg total) by mouth 3 (three) times daily as needed for moderate pain. 90 tablet 3  . traZODone (DESYREL) 150 MG tablet Take 1 tablet (150 mg total) by mouth at bedtime as needed for sleep. (Patient taking differently: Take 50 mg by mouth at bedtime as needed for sleep. ) 30 tablet 1  . TURMERIC PO Take 400 mg by mouth 2 (two) times daily.    . vitamin A 10000 UNIT capsule Take 5,000 Units by mouth daily.     . vitamin C (ASCORBIC ACID) 250 MG tablet Take 250 mg by mouth daily.    . vitamin E 100 UNIT capsule Take by mouth daily.     No current facility-administered medications on file prior to visit.     Allergies  Allergen  Reactions  . Penicillins     REACTION: nausea and vomiting

## 2018-01-04 NOTE — Telephone Encounter (Signed)
Patient returned call

## 2018-01-04 NOTE — Telephone Encounter (Signed)
Pt is calling back [phone] 

## 2018-01-04 NOTE — Telephone Encounter (Signed)
Pt is in office for update on Zephyr Valves . Pt aware that we are currently awaiting response from SN. Pt is also requesting recommendations for leg cramps. Pt states she develops bilateral leg cramps every 2-3hr nightly during sleep.  Pt stated she stopped by PCP office for recommendations and was not provided with any.   SN please advise. Thanks

## 2018-01-04 NOTE — Telephone Encounter (Signed)
Patient is here in the lobby wanting to speak to [facility].

## 2018-01-04 NOTE — Telephone Encounter (Signed)
Per Dr. Lenna Gilford verbally- okay to refer to [facility] for eval of Zephyr valve eval. Dr. Lenna Gilford requested that we not send records, until he is made aware of provider pt will be seeing and appt.  Recommend trying 1 glass of tonoc water qhs or 1 teaspoon of yellow mustard at bedtime. Pt may also try CBD oil. lmtcb x1 for pt to relay recommendations.  Referral has been placed to [facility].

## 2018-01-04 NOTE — Telephone Encounter (Signed)
lmtcb x2 for pt. 

## 2018-01-04 NOTE — Telephone Encounter (Signed)
Called patient unable to reach left message to give us a call back.

## 2018-01-05 NOTE — Telephone Encounter (Signed)
Attempted to contact pt. I did not receive an answer. I have left a message for pt to return our call.  

## 2018-01-06 ENCOUNTER — Telehealth: Payer: Self-pay | Admitting: *Deleted

## 2018-01-06 NOTE — Telephone Encounter (Signed)
lmovm for pt to return call.  

## 2018-01-06 NOTE — Telephone Encounter (Signed)
Copied from [place] [phone]. Topic: General - Other >> Jan 05, 2018 12:35 PM Rebecca [name] wrote: Reason for CRM: Patient is calling upset in regards to not being able to see a Neurologist  sooner than January. She stated she is having serve leg cramps at night and unable to sleep. She stated she feels like [name] is not helping her with her health, she is requesting to be seen sooner anywhere that has opening. She would like a call back in regards to this. Please advise

## 2018-01-06 NOTE — Telephone Encounter (Signed)
Attempted to contact pt. I did not receive an answer. I have left a message for pt to return our call.  

## 2018-01-07 ENCOUNTER — Other Ambulatory Visit: Payer: Self-pay | Admitting: Pulmonary Disease

## 2018-01-07 NOTE — Telephone Encounter (Signed)
We have attempted to contact pt several times with no success or call back from pt. Per triage protocol, message will be closed.  

## 2018-01-10 ENCOUNTER — Encounter: Payer: Self-pay | Admitting: Neurology

## 2018-01-10 ENCOUNTER — Ambulatory Visit: Payer: Medicare HMO | Admitting: Neurology

## 2018-01-10 VITALS — BP 140/90 | HR 104 | Ht 65.0 in | Wt 263.0 lb

## 2018-01-10 DIAGNOSIS — G4762 Sleep related leg cramps: Secondary | ICD-10-CM
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
## 2018-01-10 NOTE — Patient Instructions (Addendum)
Stop vitamin E  Start posterior leg stretches.  Try to hold the stretch for 30-seconds  Start physical therapy for leg stretches  Return to clinic in 4 months

## 2018-01-11 ENCOUNTER — Telehealth: Payer: Self-pay | Admitting: Pulmonary Disease

## 2018-01-11 NOTE — Telephone Encounter (Signed)
Called and spoke with Patient.  Patient stated that she went to Dr. Posey Pronto yesterday, and she was told that she was taking to much  Albuterol. Dr. Posey Pronto told her that the Albuterol was causing leg cramps.  Patient stated that she is using Albuterol 2 puffs in the morning and 2 puffs in the evening, and she has done this for 20 years.  She was also told to stop the vitamin E, that could also be causing leg cramps. She stated that she uses her  Duo nebs BID, Spiriva daily, and Symbicort daily. Patient would like Dr. Jeannine Kitten recommendations or a phone call from him.  Will make in Dr. Lenna Gilford aware

## 2018-01-11 NOTE — Telephone Encounter (Signed)
Per SN- He does not think this is causing cramps, the only way to find out is to decrease the Albuterol, and see if the cramps go away. If no benefit to lowering Albuterol, then contact neurology for eval.  Called and spoke with Patient.  SN recommendations given.  Patient stated understanding.  Patient stated that she would give a update at her 02/07/18.  Patient was made aware of [facility] referral placed on 01/04/18.  She stated understanding. Nothing further at this time.

## 2018-01-12 ENCOUNTER — Telehealth: Payer: Self-pay | Admitting: Pulmonary Disease

## 2018-01-12 DIAGNOSIS — J438 Other emphysema: Secondary | ICD-10-CM

## 2018-01-12 MED ORDER — ALPRAZOLAM 0.5 MG PO TABS: ORAL_TABLET | ORAL | 3 refills | Status: DC

## 2018-01-12 NOTE — Telephone Encounter (Signed)
Also patient stated that the referral had to say the reason for the referral is emphysema.

## 2018-01-12 NOTE — Telephone Encounter (Signed)
Per SN- [place] for xanax refill.  Xanax 0.5mg , take 1/2 to 1 tab TID as needed for nerves, max daily dose 3 tabs,#90,  3 refills called into preferred  Pharmacy, [place]. Nothing further at this time.

## 2018-01-12 NOTE — Telephone Encounter (Signed)
Dr. Dorisann Frames advise on refill for Xanax 0.5 mg 1/2 to 1 tab three times a day #90  Last RX was given 05/25/2017 Last OV 11/15/2017   Current Outpatient Medications on File Prior to Visit  Medication Sig Dispense Refill  . albuterol (VENTOLIN HFA) 108 (90 Base) MCG/ACT inhaler INHALE 2 PUFFS INTO THE LUNGS EVERY 6 HOURS AS NEEDED FOR WHEEZING OR SHORTNESS OF BREATH 54 each 4  . ALPRAZolam (XANAX) 0.5 MG tablet TAKE 1/2 TO 1 TABLET BY MOUTH 3 TIMES DAILY AS NEEDED FOR NERVES. MAX DAILY DOSE = 3 TABLETS 90 tablet 5  . Apple Cider Vinegar 500 MG TABS Take 1,000 mg by mouth daily.    . B Complex-C-Folic Acid (B-COMPLEX BALANCED) TABS Take 4 tablets by mouth 2 (two) times daily.     Marland Kitchen B-Complex-C TABS Take by mouth.    . baclofen (LIORESAL) 10 MG tablet Take 10 mg by mouth. nightly    . cetirizine (ZYRTEC) 10 MG tablet Take 10 mg by mouth daily.      . Cholecalciferol (VITAMIN D-3) 5000 UNITS TABS Take 2 tablets by mouth daily.     . Coenzyme Q10 (COQ10) 100 MG CAPS Take by mouth.    . colchicine (COLCRYS) 0.6 MG tablet Take 1 tablet (0.6 mg total) by mouth daily. (Patient taking differently: Take 0.6 mg by mouth daily. As needed) 30 tablet 11  . cyanocobalamin 2000 MCG tablet Take 2,500 mcg by mouth 2 (two) times daily.    . cyclobenzaprine (FLEXERIL) 10 MG tablet Take by mouth.    . fluticasone (FLONASE) 50 MCG/ACT nasal spray Place 2 sprays into both nostrils daily.    . Fluticasone-Salmeterol (ADVAIR) 500-50 MCG/DOSE AEPB Inhale into the lungs.    . furosemide (LASIX) 40 MG tablet TAKE 1-2 TABLETS ONCE A DAY AS NEEDED FOR SWELLING 60 tablet 11  . Gabapentin (NEURONTIN PO) Take 500 mg by mouth at bedtime.    . gabapentin (NEURONTIN) 100 MG capsule TAKE TWO CAPSULES BY MOUTH EVERY NIGHT AT BEDTIME 60 capsule 2  . glucosamine-chondroitin 500-400 MG tablet Take 1 tablet by mouth 2 (two) times daily.     Marland Kitchen guaiFENesin (MUCINEX) 600 MG 12 hr tablet Take 1,600 mg by mouth 2 (two) times daily.    Marland Kitchen  ipratropium-albuterol (DUONEB) 0.5-2.5 (3) MG/3ML SOLN Take 3 mLs by nebulization 3 (three) times daily. 270 mL 7  . levalbuterol (XOPENEX) 1.25 MG/3ML nebulizer solution 1.25 mg.    . Magnesium 400 MG CAPS Take 3 capsules by mouth 2 (two) times daily.     . Misc Natural Products (TART CHERRY ADVANCED PO) Take by mouth.    . montelukast (SINGULAIR) 10 MG tablet Take 1 tablet (10 mg total) by mouth at bedtime. 30 tablet 6  . naproxen sodium (ANAPROX) 220 MG tablet Take 220 mg by mouth daily. As needed    . NON FORMULARY Mullin herbal supplement- take 1 tab BID    . OXYGEN Inhale 4 L into the lungs.    Marland Kitchen RA KRILL OIL 500 MG CAPS Take by mouth.    . SPIRIVA HANDIHALER 18 MCG inhalation capsule PLACE 1 CAPSULE INTO INHALER AND INHALE INTO LUNGS DAILY 90 capsule 10  . SYMBICORT 160-4.5 MCG/ACT inhaler INHALE 2 PUFFS INTO THE LUNGS 2 TIMES DAILY 1 Inhaler 5  . traMADol (ULTRAM) 50 MG tablet Take 1 tablet (50 mg total) by mouth 3 (three) times daily as needed for moderate pain. 90 tablet 3  . traZODone (DESYREL) 150  MG tablet Take 1 tablet (150 mg total) by mouth at bedtime as needed for sleep. (Patient taking differently: Take 50 mg by mouth at bedtime as needed for sleep. ) 30 tablet 1  . TURMERIC PO Take 400 mg by mouth 2 (two) times daily.    . vitamin A 10000 UNIT capsule Take 5,000 Units by mouth daily.     . vitamin C (ASCORBIC ACID) 250 MG tablet Take 250 mg by mouth daily.    . vitamin E 100 UNIT capsule Take by mouth daily.    . Zinc 50 MG CAPS Take by mouth daily.     No current facility-administered medications on file prior to visit.    Allergies  Allergen Reactions  . Penicillins     REACTION: nausea and vomiting  \

## 2018-01-12 NOTE — Telephone Encounter (Signed)
I have spoke with patient I let her know that I was fixing the referral now and that we have corrected it and nothing else is needed.  I also let Dr. Lenna Gilford  Know that this was needed and he will be printing the needed information off for this nothing further needed at this time.

## 2018-01-12 NOTE — Telephone Encounter (Signed)
Patient called back in regards to this message, she is upset, and want to speak with a nurse; pt states is really frustrated for not getting the right referral entered; patient repeated twice of hurting herself;  pt contact # [phone]

## 2018-01-12 NOTE — Telephone Encounter (Signed)
Called patient unable to reach left message to give us a call back.

## 2018-01-14 ENCOUNTER — Encounter: Payer: Self-pay | Admitting: Family Medicine

## 2018-01-14 ENCOUNTER — Ambulatory Visit (INDEPENDENT_AMBULATORY_CARE_PROVIDER_SITE_OTHER): Payer: Medicare HMO | Admitting: Family Medicine

## 2018-01-14 VITALS — BP 128/78 | HR 102 | Temp 98.3°F | Ht 65.0 in | Wt 259.0 lb

## 2018-01-14 DIAGNOSIS — Z87891 Personal history of nicotine dependence: Secondary | ICD-10-CM | POA: Diagnosis not present

## 2018-01-14 DIAGNOSIS — K429 Umbilical hernia without obstruction or gangrene: Secondary | ICD-10-CM

## 2018-01-14 DIAGNOSIS — J449 Chronic obstructive pulmonary disease, unspecified: Secondary | ICD-10-CM | POA: Diagnosis not present

## 2018-01-14 DIAGNOSIS — L989 Disorder of the skin and subcutaneous tissue, unspecified: Secondary | ICD-10-CM | POA: Diagnosis not present

## 2018-01-14 NOTE — Progress Notes (Signed)
Subjective:    Patient ID: Rebecca [name], female    DOB: [birth year], 67 y.o.   MRN: [id number]  No chief complaint on file. Patient seen today for attempt to establish care and f/u on chronic conditions.  HPI Pt is a 67 yo female with pmh sig for COPD, GERD, hypothyroidism, type 2 diabetes, osteoarthritis, gout, obesity, uterine cancer.  Pt's pulmonologist, Dr. Leeanne Deed,  has been serving as her pcp.  Pt states she is trying to find a pcp but has a tendency to rub ppl the wrong way.   COPD: -seen by pulm, Dr. Leeanne Deed who is retiring. -pt has yet to find a new Pulmonologist. -taking Albuterol, duonebs, singulair 10 mg, Spiriva 18 mcg, symbicort 160-4.5 -on 4L O2 while ambulating  -pt is a former smoker.    Anxiety: -taking xanax 0.5mg , prescribed by Dr. Leeanne Deed. -sees a therapist. -states at last [place] her therapist "kissed her on the forehead" b/c she looked like she needed that. -states she and her therapist just talk when she has appointments.  Arthritis: -multiple joints -also w/ h/o bursitis -followed by Dr. Tamala Julian  Allergies: PCN  Social hx: Pt is single. 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  Pt is a former smoker.  She denies EtOH and drug use.  Pt does use Full extract cannabis oil (FECO).  Past Medical History:  Diagnosis Date  . Chest pain   . Chronic venous insufficiency   . COPD (chronic obstructive pulmonary disease) ([place])   . DJD (degenerative joint disease)   . Dysthymia   . Fibromyalgia   . GERD (gastroesophageal reflux disease)   . History of headache   . Hodgkin lymphoma of extranodal or solid organ site [facility])   . Hypothyroid   . IBS (irritable bowel syndrome)   . Metabolic syndrome X   . Morbid obesity ([place])   . OSA (obstructive sleep apnea)   . Vitamin D deficiency     Allergies  Allergen Reactions  . Penicillins     REACTION: nausea and vomiting    ROS General: Denies fever, chills, night sweats, changes in weight, changes in appetite    HEENT: Denies headaches, ear pain, changes in vision, rhinorrhea, sore throat CV: Denies CP, palpitations, orthopnea  +SOB Pulm: Denies cough, wheezing  +SOB GI: Denies abdominal pain, nausea, vomiting, diarrhea, constipation GU: Denies dysuria, hematuria, frequency, vaginal discharge Msk: Denies muscle cramps, joint pains  +shoulder, b/l knee pain Neuro: Denies weakness, numbness, tingling Skin: Denies rashes, bruising Psych: Denies depression, hallucinations  +anxiety    Objective:    Blood pressure 128/78, pulse (!) 102, temperature 98.3 F (36.8 C), temperature source Oral, height 5\' 5"  (1.651 m), weight 259 lb (117.5 kg), SpO2 93 %.  Gen. Pleasant, well-nourished, obese, in mod distress with ambulation, normal affect. Using 4 L O2 via [place] when ambulating with increased SOB.  Not using O2 when sitting.  Pt has difficulty answering direct questions, gives detailed side stories. HEENT: [place]/AT, face symmetric, no scleral icterus, PERRLA,  nares patent without drainage Lungs: no accessory muscle use, CTAB, no wheezes or rales Cardiovascular: RRR, no m/r/g, no peripheral edema Abdomen: BS present, soft, large umbilical hernia present reducible, nontender, non-gangrenous, NT/ND. Neuro:  A&Ox3, CN II-XII intact, normal gait Skin:  Warm, no rash.  1.5 cm round, slightly raised, dry, with scale on lateral LLE. Similar lesion on LUE  Wt Readings from Last 3 Encounters:  01/14/18 259 lb (117.5 kg)  01/10/18 263 lb (119.3 kg)  11/15/17 258 lb 12.8 oz (117.4 kg)    Lab Results  Component Value Date   WBC 9.4 03/16/2017   HGB 15.3 (H) 03/16/2017   HCT 46.4 (H) 03/16/2017   PLT 350.0 03/16/2017   GLUCOSE 110 (H) 03/16/2017   CHOL 168 05/05/2012   TRIG 113.0 05/05/2012   HDL 26.20 (L) 05/05/2012   LDLCALC 119 (H) 05/05/2012   ALT 16 03/16/2017   AST 19 03/16/2017   NA 143 03/16/2017   K 4.5 03/16/2017   CL 103 03/16/2017   CREATININE 1.17 (H) 03/16/2017   BUN 19 03/16/2017   CO2  29 03/16/2017   TSH 1.24 03/16/2017   HGBA1C 6.2 03/16/2017    Assessment/Plan:  COPD mixed type ([place]) -pt encouraged to est with a new pulmonologist -continue albuterol inhaler prn, Spiriva, Symbicort, albuterol neb. -continue 4L O2 via [place]   Former smoker -counseled on importance of not smoking.  Umbilical hernia without obstruction and without gangrene -discussed treatment options such as surgical repair -pt to decide if she would like to see gen surg for further discussion -given handout -given RTC or ED precautions  Skin lesions -discussed having areas removed by Dermatology -pt has an appt in March 2020.  Encouraged to keep appt  Pt to transfer/est. care with a provider in the office that can prescribe xanax.  Grier Mitts, MD

## 2018-01-14 NOTE — Patient Instructions (Signed)
Chronic Obstructive Pulmonary Disease Chronic obstructive pulmonary disease (COPD) is a long-term (chronic) condition that affects the lungs. COPD is a general term that can be used to describe many different lung problems that cause lung swelling (inflammation) and limit airflow, including chronic bronchitis and emphysema. If you have COPD, your lung function will probably never return to normal. In most cases, it gets worse over time. However, there are steps you can take to slow the progression of the disease and improve your quality of life. What are the causes? This condition may be caused by:  Smoking. This is the most common cause.  Certain genes passed down through families.  What increases the risk? The following factors may make you more likely to develop this condition:  Secondhand smoke from cigarettes, pipes, or cigars.  Exposure to chemicals and other irritants such as fumes and dust in the work environment.  Chronic lung conditions or infections.  What are the signs or symptoms? Symptoms of this condition include:  Shortness of breath, especially during physical activity.  Chronic cough with a large amount of thick mucus. Sometimes the cough may not have any mucus (dry cough).  Wheezing.  Rapid breaths.  Gray or bluish discoloration (cyanosis) of the skin, especially in your fingers, toes, or lips.  Feeling tired (fatigue).  Weight loss.  Chest tightness.  Frequent infections.  Episodes when breathing symptoms become much worse (exacerbations).  Swelling in the ankles, feet, or legs. This may occur in later stages of the disease.  How is this diagnosed? This condition is diagnosed based on:  Your medical history.  A physical exam.  You may also have tests, including:  Lung (pulmonary) function tests. This may include a spirometry test, which measures your ability to exhale properly.  Chest X-ray.  CT scan.  Blood tests.  How is this  treated? This condition may be treated with:  Medicines. These may include inhaled rescue medicines to treat acute exacerbations as well as long-term, or maintenance, medicines to prevent flare-ups of COPD. ? Bronchodilators help treat COPD by dilating the airways to allow increased airflow and make your breathing more comfortable. ? Steroids can reduce airway inflammation and help prevent exacerbations.  Smoking cessation. If you smoke, your health care provider may ask you to quit, and may also recommend therapy or replacement products to help you quit.  Pulmonary rehabilitation. This may involve working with a team of health care providers and specialists, such as respiratory, occupational, and physical therapists.  Exercise and physical activity. These are beneficial for nearly all people with COPD.  Nutrition therapy to gain weight, if you are underweight.  Oxygen. Supplemental oxygen therapy is only helpful if you have a low oxygen level in your blood (hypoxemia).  Lung surgery or transplant.  Palliative care. This is to help people with COPD feel comfortable when treatment is no longer working.  Follow these instructions at home: Medicines  Take over-the-counter and prescription medicines (inhaled or pills) only as told by your health care provider.  Talk to your health care provider before taking any cough or allergy medicines. You may need to avoid certain medicines that dry out your airways. Lifestyle  If you are a smoker, the most important thing that you can do is to stop smoking. Do not use any products that contain nicotine or tobacco, such as cigarettes and e-cigarettes. If you need help quitting, ask your health care provider. Continuing to smoke will cause the disease to progress faster.  Avoid exposure   to things that irritate your lungs, such as smoke, chemicals, and fumes.  Stay active, but balance activity with periods of rest. Exercise and physical activity will  help you maintain your ability to do things you want to do.  Learn and use relaxation techniques to manage stress and to control your breathing.  Get the right amount of sleep and get quality sleep. Most adults need 7 or more hours per night.  Eat healthy foods. Eating smaller, more frequent meals and resting before meals may help you maintain your strength. Controlled breathing Learn and use controlled breathing techniques as directed by your health care provider. Controlled breathing techniques include:  Pursed lip breathing. Start by breathing in (inhaling) through your nose for 1 second. Then, purse your lips as if you were going to whistle and breathe out (exhale) through the pursed lips for 2 seconds.  Diaphragmatic breathing. Start by putting one hand on your abdomen just above your waist. Inhale slowly through your nose. The hand on your abdomen should move out. Then purse your lips and exhale slowly. You should be able to feel the hand on your abdomen moving in as you exhale.  Controlled coughing Learn and use controlled coughing to clear mucus from your lungs. Controlled coughing is a series of short, progressive coughs. The steps of controlled coughing are: 1. Lean your head slightly forward. 2. Breathe in deeply using diaphragmatic breathing. 3. Try to hold your breath for 3 seconds. 4. Keep your mouth slightly open while coughing twice. 5. Spit any mucus out into a tissue. 6. Rest and repeat the steps once or twice as needed.  General instructions  Make sure you receive all the vaccines that your health care provider recommends, especially the pneumococcal and influenza vaccines. Preventing infection and hospitalization is very important when you have COPD.  Use oxygen therapy and pulmonary rehabilitation if directed to by your health care provider. If you require home oxygen therapy, ask your health care provider whether you should purchase a pulse oximeter to measure your  oxygen level at home.  Work with your health care provider to develop a COPD action plan. This will help you know what steps to take if your condition gets worse.  Keep other chronic health conditions under control as told by your health care provider.  Avoid extreme temperature and humidity changes.  Avoid contact with people who have an illness that spreads from person to person (is contagious), such as viral infections or pneumonia.  Keep all follow-up visits as told by your health care provider. This is important. Contact a health care provider if:  You are coughing up more mucus than usual.  There is a change in the color or thickness of your mucus.  Your breathing is more labored than usual.  Your breathing is faster than usual.  You have difficulty sleeping.  You need to use your rescue medicines or inhalers more often than expected.  You have trouble doing routine activities such as getting dressed or walking around the house. Get help right away if:  You have shortness of breath while you are resting.  You have shortness of breath that prevents you from: ? Being able to talk. ? Performing your usual physical activities.  You have chest pain lasting longer than 5 minutes.  Your skin color is more blue (cyanotic) than usual.  You measure low oxygen saturations for longer than 5 minutes with a pulse oximeter.  You have a fever.  You feel too tired   to breathe normally. Summary  Chronic obstructive pulmonary disease (COPD) is a long-term (chronic) condition that affects the lungs.  Your lung function will probably never return to normal. In most cases, it gets worse over time. However, there are steps you can take to slow the progression of the disease and improve your quality of life.  Treatment for COPD may include taking medicines, quitting smoking, pulmonary rehabilitation, and changes to diet and exercise. As the disease progresses, you may need oxygen  therapy, a lung transplant, or palliative care.  To help manage your condition, do not smoke, avoid exposure to things that irritate your lungs, stay up to date on all vaccines, and follow your health care provider's instructions for taking medicines. This information is not intended to replace advice given to you by your health care provider. Make sure you discuss any questions you have with your health care provider. Document Released: 11/26/2004 Document Revised: 03/23/2016 Document Reviewed: 03/23/2016 Elsevier Interactive Patient Education  2018 [place], Adult A hernia is the bulging of an organ or tissue through a weak spot in the muscles of the abdomen (abdominal wall). Hernias develop most often near the navel or groin. There are many kinds of hernias. Common kinds include:  Femoral hernia. This kind of hernia develops under the groin in the upper thigh area.  Inguinal hernia. This kind of hernia develops in the groin or scrotum.  Umbilical hernia. This kind of hernia develops near the navel.  Hiatal hernia. This kind of hernia causes part of the stomach to be pushed up into the chest.  Incisional hernia. This kind of hernia bulges through a scar from an abdominal surgery.  What are the causes? This condition may be caused by:  Heavy lifting.  Coughing over a long period of time.  Straining to have a bowel movement.  An incision made during an abdominal surgery.  A birth defect (congenital defect).  Excess weight or obesity.  Smoking.  Poor nutrition.  Cystic fibrosis.  Excess fluid in the abdomen.  Undescended testicles.  What are the signs or symptoms? Symptoms of a hernia include:  A lump on the abdomen. This is the first sign of a hernia. The lump may become more obvious with standing, straining, or coughing. It may get bigger over time if it is not treated or if the condition causing it is not treated.  Pain. A hernia is usually painless,  but it may become painful over time if treatment is delayed. The pain is usually dull and may get worse with standing or lifting heavy objects.  Sometimes a hernia gets tightly squeezed in the weak spot (strangulated) or stuck there (incarcerated) and causes additional symptoms. These symptoms may include:  Vomiting.  Nausea.  Constipation.  Irritability.  How is this diagnosed? A hernia may be diagnosed with:  A physical exam. During the exam your health care provider may ask you to cough or to make a specific movement, because a hernia is usually more visible when you move.  Imaging tests. These can include: ? X-rays. ? Ultrasound. ? CT scan.  How is this treated? A hernia that is small and painless may not need to be treated. A hernia that is large or painful may be treated with surgery. Inguinal hernias may be treated with surgery to prevent incarceration or strangulation. Strangulated hernias are always treated with surgery, because lack of blood to the trapped organ or tissue can cause it to die. Surgery to treat a hernia  involves pushing the bulge back into place and repairing the weak part of the abdomen. Follow these instructions at home:  Avoid straining.  Do not lift anything heavier than 10 lb (4.5 kg).  Lift with your leg muscles, not your back muscles. This helps avoid strain.  When coughing, try to cough gently.  Prevent constipation. Constipation leads to straining with bowel movements, which can make a hernia worse or cause a hernia repair to break down. You can prevent constipation by: ? Eating a high-fiber diet that includes plenty of fruits and vegetables. ? Drinking enough fluids to keep your urine clear or pale yellow. Aim to drink 6-8 glasses of water per day. ? Using a stool softener as directed by your health care provider.  Lose weight, if you are overweight.  Do not use any tobacco products, including cigarettes, chewing tobacco, or electronic  cigarettes. If you need help quitting, ask your health care provider.  Keep all follow-up visits as directed by your health care provider. This is important. Your health care provider may need to monitor your condition. Contact a health care provider if:  You have swelling, redness, and pain in the affected area.  Your bowel habits change. Get help right away if:  You have a fever.  You have abdominal pain that is getting worse.  You feel nauseous or you vomit.  You cannot push the hernia back in place by gently pressing on it while you are lying down.  The hernia: ? Changes in shape or size. ? Is stuck outside the abdomen. ? Becomes discolored. ? Feels hard or tender. This information is not intended to replace advice given to you by your health care provider. Make sure you discuss any questions you have with your health care provider. Document Released: 02/16/2005 Document Revised: 07/17/2015 Document Reviewed: 12/27/2013 Elsevier Interactive Patient Education  2017 [employer].

## 2018-01-18 ENCOUNTER — Encounter: Payer: Self-pay | Admitting: Family Medicine

## 2018-01-18 DIAGNOSIS — K429 Umbilical hernia without obstruction or gangrene: Secondary | ICD-10-CM | POA: Insufficient documentation

## 2018-02-07 ENCOUNTER — Encounter: Payer: Self-pay | Admitting: Pulmonary Disease

## 2018-02-07 ENCOUNTER — Ambulatory Visit (INDEPENDENT_AMBULATORY_CARE_PROVIDER_SITE_OTHER): Payer: Medicare HMO | Admitting: Pulmonary Disease

## 2018-02-07 VITALS — BP 122/70 | HR 94 | Temp 97.9°F | Ht 65.0 in | Wt 258.6 lb

## 2018-02-07 DIAGNOSIS — F341 Dysthymic disorder: Secondary | ICD-10-CM

## 2018-02-07 DIAGNOSIS — G4733 Obstructive sleep apnea (adult) (pediatric): Secondary | ICD-10-CM | POA: Diagnosis not present

## 2018-02-07 DIAGNOSIS — K219 Gastro-esophageal reflux disease without esophagitis: Secondary | ICD-10-CM | POA: Diagnosis not present

## 2018-02-07 DIAGNOSIS — M150 Primary generalized (osteo)arthritis: Secondary | ICD-10-CM

## 2018-02-07 DIAGNOSIS — I872 Venous insufficiency (chronic) (peripheral): Secondary | ICD-10-CM | POA: Diagnosis not present

## 2018-02-07 DIAGNOSIS — K589 Irritable bowel syndrome without diarrhea: Secondary | ICD-10-CM

## 2018-02-07 DIAGNOSIS — J9611 Chronic respiratory failure with hypoxia: Secondary | ICD-10-CM | POA: Diagnosis not present

## 2018-02-07 DIAGNOSIS — Z8542 Personal history of malignant neoplasm of other parts of uterus: Secondary | ICD-10-CM | POA: Diagnosis not present

## 2018-02-07 DIAGNOSIS — J449 Chronic obstructive pulmonary disease, unspecified: Secondary | ICD-10-CM

## 2018-02-07 DIAGNOSIS — R7302 Impaired glucose tolerance (oral): Secondary | ICD-10-CM

## 2018-02-07 DIAGNOSIS — M159 Polyosteoarthritis, unspecified: Secondary | ICD-10-CM

## 2018-02-07 MED ORDER — ALPRAZOLAM 0.5 MG PO TABS: ORAL_TABLET | ORAL | 1 refills | Status: DC

## 2018-02-07 NOTE — Progress Notes (Addendum)
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 regularly including- O2 at prescribed, DUONEB-Tid, Symbicort-Bid, Spiriva daily, GFN400-2Tid w/ fluids, ProventilHFA as needed;  She refuses the FLU vaccine...  ADDENDUM>>  She is engaged in a battle w/ DME- [facility] regarding her O2 tanks (E-tanks) which she insists has to be delivered to her home; [facility];  She requested a letter from me- done 02/11/17 (see letter section of EMR)...   ~  March 16, 2017:  440moOV & pulmonary follow up visit>  GiAnokhi Shannoneturns & notes that she wants to switch DME companies; she tells me that she is actually much improved w/ FECO Oil which she describes as a 1:1 mixture of THC & CBD and it's really helping;  She reports "2 attacks" during the interval & both were from a worker in her house- 1st from smoking, 2nd from using drano & she is very sens to the odors; she notes that she was able to adjust her FECO & improved; "I can breathe thru my nose great now" and she walks w/ O2- pulse dose at 4L/min & did OK w/ all sats in the 90's;  She is c/o leg cramps Qhs and we  discussed the options for 1-soap 2-yellow mustard 3-tonic water but she wants musc relaxer- BACLOFEN 1024md;  She notes a lot of stress w/ paralyzed dog & her [facility] oxygen tank problem... 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 We reviewed the following pulmonary problems during today's office visit>      [facility]  Chr nasal obstruction L>R (rec humidification & saline, Flonase, & breathe-right nasal strips) & hearing loss=> cerumen impactions removed & audiogram revealed hi freq hearing loss.    Hx mild OSA w/ mod desat, loud snoring, & leg jerks (on sleep study 2005)> she tried CPAP10 but stopped on her own & declined to repeat split night study & re-try CPAP rx; she says she's sleeping satis & wakes refreshed.    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 1993;  supposed to be on HomeO2 at 2-3L rest & 4+L exercise, NEBS w/ Duoneb Tid, Symbicort160-2spBid, Spiriva daily, GFN400- encouraged to use 2Tid w/ fluids; she stopped Medrol on her own; states that Flutter causes "spasms" in her back; states O2 doesn't help since she can't breathe thru her nose (she has been eval [name] & [facility]); finally tried PulmRehab but stopped after 64moas "fruitless"; prev stated she "can't breathe at all, can't do anything" etc; SOB w/ ADLs etc; Note> [facility]- last PFT (2011) showed FEV1= 1.5 (55%) & %1sec=59 ==> 3/15 she announced that she's been cured by taking a Magnesium supplement, along w/ herbal formula "Clear lungs" and "Lung tonic"; she had stopped her Oxygen;  Now using O2 up to 15L/min on her own since she can't breathe thru her nose- advised 2L/min rest & 4L/min exercise + humidity, nasal hygiene, etc;  Recently noted that her breathing  improved by taking a "CBD oil", now "FECO" oil, & she solved her oxygen prob w/ an O2 cannula w/ longer nasal prongs + saline...    She refuses ALL immunizations...   EXAM shows Afeb, VSS, O2sat=92% on RA in office;  248#, 5'9"Tall, BMI=38;  HEENT- sl nasal congestion, erythema, turbinate hypertrophy, dev septum, throat= Mallampati 2, sl red, no exudates or lesions;  Chest- decr BS bilat w/o w/r/r & no signs of consolidation;  Heart- RR w/o m/r/g;  Abd- Obese, panniculus, soft/ nontender;  Ext- VI w/ trace edema...  CXR 03/16/17 (independently reviewed by me in the PACS system) shows borderline heart size & atherosclerotic ao, COPD w/ emphysema & chr bonchitic changes + fibrotic changes at bases- NAD, boney demineralization...  LABS 03/16/17>  Chems- ok w/ BS=110, A1c=6.2, Cr=1.17, LFTs wnl;  CBC- norm w/ Hg=15.3, WBC=9.4 IMP/PLAN>>  GArtis Delayis stable on her FECO oil but I have reminded her to STAY on her O2, DUONEB Tid, Symbicort Bid, Spiriva daily, & Mucinex, fluids, etc;  CXR & LABS look good...   ~  Jul 15, 2017:  475moOV & [name] believes that the spring pollen is causing some incr SOB & head congestion;  She continues on her FECO oil treatments ($185 for five 1cc syringes- lasts her 60m75mond even started her dog Callie on the CBD oil & she's better too!  She has mult somatic complaints and concerns-- wax in ears, nocturnal leg cramps, "I have trouble regulating body temp", constip on Miralax (she has her own ideas about things- eg constip related to hyst w/ bladder & bowel pushing on spine, she needs BM in order to sleep, etc)... She tells me she wants a job to earn enough $$ to get an "aiEmergency planning/management officer We reviewed the following pulmonary problems during today's office visit>      [facility]Plonk & McKinnond at [facility]>  Chr nasal obstruction L>R (rec humidification & saline, Flonase, & breathe-right nasal strips) & hearing loss=> cerumen impactions removed & audiogram revealed hi freq hearing loss.     Hx mild OSA w/ mod desat, loud snoring, & leg jerks (on sleep study 2005)> she tried CPAP10 but stopped on her own & declined to repeat split night study & re-try CPAP rx; she says she's sleeping satis & wakes refreshed. ; she refuses repeat sleep study, can't tolerate the various interface options...    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 1993; supposed to be on HomeO2 at 2-3L rest & 4+L exercise, NEBS w/ Duoneb Tid, Symbicort160-2spBid,  Spiriva daily, GFN400- encouraged to use 2Tid w/ fluids; she stopped Medrol on her own; states that Flutter causes "spasms" in her back; states O2 doesn't help since she can't breathe thru her nose (she has been eval by ENT- [place]); finally tried PulmRehab but stopped after 46moas "fruitless"; prev stated she "can't breathe at all, can't do anything" etc; SOB w/ ADLs etc; Note> [facility]- last PFT (2011) showed FEV1= 1.5 (55%) & %1sec=59 ==> 3/15 she announced that she's been cured by taking a Magnesium supplement, along w/ herbal formula "Clear lungs" and "Lung tonic"; she had stopped her Oxygen;  Now using O2 up to 15L/min on her own since she can't breathe thru her nose- advised 2L/min rest & 4L/min exercise + humidity, nasal hygiene, etc;  Recently noted that her breathing improved by taking a "CBD oil", now "FECO" oil, & she solved her oxygen prob w/ an O2 cannula w/ longer nasal prongs + saline...    Morbid obesity & hx eating disorder> she still sees psychologist & eating disorder specialist [name] Q2wks...    She refuses ALL immunizations...   EXAM shows Afeb, VSS, O2sat=92% on RA in office;  248#, 5'9"Tall, BMI=38;  HEENT- sl nasal congestion, erythema, turbinate hypertrophy, dev septum, throat= Mallampati 2, sl red, no exudates or lesions;  Chest- decr BS bilat w/o w/r/r & no signs of consolidation;  Heart- RR w/o m/r/g;  Abd- Obese, panniculus, soft/ nontender;  Ext- VI w/ trace edema... IMP/PLAN>>  GArtis Delayis  reminded to follow the prescribed regimen-- O2 at 2L/min rest & 4L/min exerc; NEB w/ DUONEB TID, SYMBICORT160-2spBid, SPIRIVA once daily, AlbutHFA rescue as needed; MUCINEX 12041mBid, fluids, Zyrtek & Flonase;  She has a long list of supplement medications that she ascribes to including FECO oil... I have nothing else to offer her at this point.   ~  November 15, 2017:  51m851moV & GilArtis Delayels as though she is having an allergy flair up; she had a lot to ventilate about today & talked non-stop for 45 min w/ barely a breath in betw long sentences;  She is on-line w/ a COPD group and they discuss mult issues- she is the advocate for FECO oil which she feels has helped her more than anything, but several colleagues in the group w/ hx severe emphysema have had Zephyr valve implantation via interventioal bronchoscopy & report doing much better;  She inquired regarding this innovation & I indicated that we may have that capability here soon w/ DrG's interventional program but pt selection is the most important issue- promised we would look into this for her...  She had several issues today>>     She is concerned about a finding on L/S spine films done by DrZSmith & not reviewed w/ the pt (she saw it on my chart)> Films reported calcif projecting in the lower pole of the right kidney & renal pelvis, extending throughout the mid right ureter to just below the pelvic brim & is markedly increased in extent compared to priors;  The etiology is unknown & CT Abd & Pelvis rec for further eval;  She prefers to get a UROLOGY referral & let them do the CT scan via their office + any further eval that may be warrented...     She has several spots on her left leg & wants a Dermatology referral to have these checked out...     She still sees PsyEngineer, water  [name] regularly for her eating disorder & anxiety... Overall her spirits are "UP", her dogs are doing better, etc...    She continues to refuse all indicated adult  vaccinations, but persists in taking numerous supplement medications including the FECO oil, tumeric, herbal supplements, vitamins, cinnamon, and apple cider vinegar...    She requests to start back on Singulair 21m/d for her breathing and allergies... EXAM shows Afeb, VSS, O2sat=93% on RA in office;  259#, 5'9"Tall, BMI=38;  HEENT- sl nasal congestion, erythema, turbinate hypertrophy, dev septum, throat= Mallampati 2, sl red, no exudates or lesions;  Chest- decr BS bilat w/o w/r/r & no signs of consolidation;  Heart- RR w/o m/r/g;  Abd- Obese, panniculus, soft/ nontender;  Ext- VI w/ trace edema... IMP/PLAN>>  GArtis Delayis asked to continue her NEB w/ DUONEB Tid regularly, followed by Symbicort160- 2spBid & Spirva handihaler once daily; she has home O2, Mucinex, Zyrtek, Flonase;  We are adding the Singulair 1104mQd as a trial;  I told her we would look into the interventional Zephyr valve therapy;  In the meanwhile we will set up appts w/ Urology & Dermatology per her requests...  I indicated to her that I would be retiring at the end of the yr & she requested one additional visit in Dec2019...    ~  February 07, 2018:  70m31moV & [name] has made arrangements for her medical f/u after my retirement at the end of the month- [facility] (but she doesn't prescribe Alprazolam so will be looking for a new PCP), and [facility] to see if she is a candidate for Zephyr Valve treatment for her emphysema; she has in addition set up [facility] wants a referral back to see Dr. EliReyes IvanYN, for a follow up GYN eval s/p uterine cancer surg ~13 yrs ago... GilArtis Delay c/o some incr in her chr SOB/DOe that she believes is due to a smell in her house; she is only using her O2 "as needed"; she is c/o leg cramps that she says are relieved by having a bowel movement at night; she is using "[employer]"  products from [employer] by friends on Facebook; notes that her FECO oil helps her lungs but not her legs; she saw Urology due to "calcium build up in my kidneys" but the copay was too high; she saw NEURO- DrPatel for her leg cramps- note reviewed, she has also been eval by DrZSmith, gabapentin caused cognitive side effects she say, taking Baclofen & Magnesium but no benefit, she has tried tonic water/ mustard/ heat/ OTC supplements; not doing exercises or stretching, 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  We reviewed the following pulmonary problems during today's office visit>      ENT- [facility]Plonk & McKinnond at [facility]>  Chr nasal obstruction L>R (rec humidification & saline, Flonase, & breathe-right nasal strips) & hearing loss=> cerumen impactions removed & audiogram revealed hi freq hearing loss.    Hx mild OSA w/ mod desat, loud snoring, & leg jerks (on sleep study 2005)> she tried CPAP10 but stopped on her own & declined to repeat split night study & re-try CPAP rx; she says she's sleeping satis & wakes refreshed; she refuses repeat sleep study, can't tolerate the various interface options...Marland KitchenMarland Kitchen  COPD/ Emphysema> ex-moker quit 1993; supposed to be on HomeO2 at 2-3L rest & 4+L exercise, NEBS w/ Duoneb Tid, Symbicort160-2spBid, Spiriva daily, GFN400- encouraged to use 2Tid w/ fluids; she stopped Medrol on her own; states that Flutter causes "spasms" in her back; states O2 doesn't help since she can't breathe thru her nose (she has been eval [name] & [facility]); finally tried PulmRehab but stopped after 38moas "fruitless"; prev stated she "can't breathe at all, can't do anything" etc; SOB w/ ADLs etc; Note> [facility]- last PFT (2011) showed FEV1= 1.5 (55%) & %1sec=59 ==> 3/15 she announced that she's been cured by taking a Magnesium supplement, along w/ herbal  formula "Clear lungs" and "Lung tonic"; she had stopped her Oxygen;  Now using O2 up to 15L/min on her own since she can't breathe thru her nose- advised 2L/min rest & 4L/min exercise + humidity, nasal hygiene, etc;  Recently noted that her breathing improved by taking a "CBD oil", now "FECO" oil, & she solved her oxygen prob w/ an O2 cannula w/ longer nasal prongs + saline; [facility] referred.    Morbid obesity & hx eating disorder> she still sees psychologist & eating disorder specialist [name] Q2wks...    She refuses ALL immunizations...   EXAM shows Afeb, VSS, O2sat=91% on RA in office; Wt 259#, 5'9"Tall, BMI=38;  HEENT- sl nasal congestion, erythema, turbinate hypertrophy, dev septum, throat= Mallampati 2, sl red, no exudates or lesions;  Chest- decr BS bilat w/o w/r/r & no signs of consolidation;  Heart- RR w/o m/r/g;  Abd- Obese, panniculus, soft/ nontender;  Ext- VI w/ trace edema... IMP/PLAN>>  GArtis Delayis all over the place w/ her symptoms today, however her breathing appears to be at baseline & I again stressed the need for regular med regimen as above;  In light of my up-coming retirement- she has arranged for her own Gen Medicine and Pulm follow up visits as noted...           Problem List:    OBSTRUCTIVE SLEEP APNEA (ICD-327.23) - sleep study 2005 showed RDI=15 w/ desat to 78%, loud snoring & +leg jerks> on CPAP 10, prev used intermittently but now not at all... ~  11/12:  She has agreed to a new Sleep Study- split night protocol, to recheck her OSA & determine CPAP needs (check ABG if poss too)==> she never followed thru w/ the study. ~  Pt states that she is resting satis and wakes refreshed, energy better... ~  12/15: she reports not resting due to left shoulder/ arm pain & we will refer to Ortho (she saw Dr. ZGardenia Phlegm... ~  She continues to refuse repeat sleep study, CPAP, and declines f/u w/ sleep med...  [facility] & [facility]> he is working to get her nares open to facilitate her breathing & nasal O2 Rx...  ~  Spring 2016> ENT eval by DrPlonk & his notes are reviewed> HxCOPD w/ revers component and chr nasal obstruction L>R, nasal crusting, bilat inferior nasal turbinate hypertrophy, etc;  They rec nasal saline, nasal steroid spray, mupirocin ointment, and breathe right nasal strips; they will consider surg if not improved...   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 supposed to be on NEBULIZER Qid, ADVAIR 500Bid + SPIRIVA daily (compliance is a serious issue- she freq stops all  meds) ==> she had a second opinion consult at [facility] by DrAdair in 2006... ~  A1AT level is normal 218 (83-200) 3/09... ~  baseline CXR w/ borderline Cor, COPD, interstitial scarring/ atelectasis/ obesity... ~  PFTs 3/07 showed FVC=2.74 (82%), FEV1=1.67 (62%), %1sec=61, mid-flows=27%pred... improved from 2005. ~  CTChest in 2007 & 4/09 showed severe centrilob emyphysema, + interstitial fibrosis, sm HH, left renal cyst... neg for PE... ~  2010:  breathing improved w/ weight reduction after Lap-band surg... ~  2/11: COPD exac w/ neg CXR (chr changes, NAD), Rx w/ Depo/ Pred/ Avelox/ Mucinex/ NEBS/ Advair/ Spiriva/ etc... ~  PFTs 3/11 showed FVC= 2.57 (73%), FEV1= 1.51 (55%), %1sec=59, mid-flows= 21%pred. ~  Noncompliant w/ Rx & O2 thru 2012... On disability since 2011 & finally got Medicare coverage 9/12... ~  11/12: presents w/ worsening breathing & dyspnea w/ ADLs ==> we outlined further eval w/ 2DEcho, Sleep Study; Rx w/ NEBS QID, Advair500Bid, Spiriva daily, Mucinex, Oxygen, Lasix, Zoloft, Xanax, etc (she declined to proceed w/ the sleep study, 2DEcho, etc)... 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  ~  4/13: she is c/o the nasal O2 not helping since it's hard to breathe thru nose &  wants ENT referral==> saw [name] who wanted to do surg but she has severe COPD & hi risk; asked to start PulmRehab & she agrees... ~  10/13: she did PulmRehab for 67mo6-7/13 but quit since it was "fruitless"- barely got to do any exercise since it was so difficult getting to the facility,etc; still c/o nasal problems limiting her benefit from O2 & we discussed the poss of ?transtracheal oxygen? ~  9/14: exsmoker quit 1993; supposed to be on HomeO2- 2L rest & 4L exercise, NEBS w/ Albut, Advair500Bid, Spiriva daily, Mucinex1-2Bid but not taking anything regularly; states that Flutter & Mucinex cause "spasms" in her back; states O2 doesn't help since she can't breathe thru her nose (she has been eval by ENT, [facility]; [facility] tried [facility] after 250mos "fruitless"; states she "can't breathe at all, can't do anything" etc; SOB w/ ADLs=> O2 recert today (see below)... Note> [facility] and last PFT (2011) showed FEV1= 1.5 (55%) & %1sec=59... OXYGEN RE-CERT done today... ~  3/15: see above- pt states that a magnesium supplement along w/ herbal supplements "clear lungs" and "lung tonic" have made all the difference & she no longer needs oxygen, exercising regularly & much improved; still taking Advair500, Spiriva, AlbutHFA prn...  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 ~  CXR 9/15 showed COPD/emphysema, no acute abnormality... ~  11/07/13: Ambulatory O2 Test 9/15> O2 sat on RA at rest= 92% w/ pulse=86;  Nadir O2sat on RA after 2 laps= 84% w/ pulse=96...  ~  11/17/13: she presented w/ acute SOB, wheezing, COPD exac> treated w/ NEBS, Depo120, Pred taper, Mucinex1200Bid, fluids, & continue the Spiriva & Oxygen; ch her Advair to NEBS w/ Xopenex & Budesonide regularly... ~  10/15: she is much improved w/ Pred taper but she wants off; reminded to use NEBS w/ Xopenex & Budes Bid every day... ~  12/15:  she is off the Pred, but also stopped the Xopenex/ Budesonide due to cost; she has gone back on her Advair500 & Spiriva, plus Albut via NEBS, Mucinex/ Fluids/ etc... ~  [facility]  [facility] w/ COPD exac> disch on Oxygen, Pred taper, Levaquin750, NEBS w/ Xopenex, Advair500, Spiriva, Mucinex, etc... ~  3/16: post hosp check, very frustrated that she can't get things the way SHE wants them; c/o O2 since she can't breathe thru nose & we reviewed nasal hygiene, huidified O2, ENT re-eval; on Pred taper, off Levaquin now, Advair500Bid, Spiriva daily, NEBS w/ Xopenex Bid, Mucinex (not taking regularly & asked to do 2Bid + fluids); we [facility] program & recheck pt in 3 weeks... ~  4/16: she continues to be very frustrated w/ her disease & her care- can't breathe thru her nose & she feels the O2 is not doing her any good; we discussed refer to [place] for their review to see if there is anything they can do.  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 ~  PFT 08/21/14 showing FVC=1.63 (50%), FEV1=0.63 (25%), %1sec=39;  FEV1 post bronchodil= 0.97 (39%) for a 50% improvement; DLCO was 22% predicted...  ~  CTChest 08/21/14 showed severe centrilobular emphysema, diffuse bronchial thickening, mild mucus plugging, no adenopathy, no lung nodules etc; also showed mild Ao & coronary calcif, lap band at GE junction, splenosis, DJD Tspine. ~  Summer 2016: She had ENT & PULM evaluations at [facility], continued to see DrStephens (PulmFellow w/ DrHaponik)  ATYPICAL CHEST PAIN (ICD-786.50) - eval by DrWall in 2009. ~  baseline EKG w/ NSR, PVC's, NSSTTWA, NAD... ~  2DEcho  2/05 showed mild MR/ TR, norm LA/ RV, EF=50-55%... ~  NuclearStressTest 4/09 was norm- no ischemia, EF=55%... similiar to prev studies at [place] & 2007... ~  3/15: she notes some atypCP & requests a cardiac referral for eval- prev seen by DrWall, we will call for Cards consult per her request... ~  EKG 3/15 showed NSR, rate91, occas PVCs, otherw wnl... ~  4/15: she had Cards eval DrNishan> he did not feel that invasive testing was warranted; she had 2DEcho but refused Myoview due to co-pay costs...  ~  2DEcho 4/15 showed norm LV sys function w/ EF=60-65%, normal wall motion, Gr1DD, normal valves, normal RV & PA pressures... ~  EKG 2/16 showed NSR, rate88, low voltage, otherw wnl... ~  2DEcho 2/16 showed norm LVF w/ EF=55-60%, Gr1DD, norm valves and norm RV.  VENOUS INSUFFICIENCY, CHRONIC (ICD-459.81) - Hx chr ven insuffic w/ edema... on LASIX 67m- 1-2 daily (she self medicates)... ~  Labs 3/15 showed Cr=1.5 and she is asked to decr the Lasix to 452mQam...  IMPAIRED GLUCOSE TOLERANCE >>  METABOLIC SYNDROME X (ICOIZ-124.5- she is on diet alone (refused Metformin therapy)... ~  labs 9/08 showed BS=120... FBS 5/08 was 93, HgA1c=7.1 ~  labs 5/10 showed BS= 182, A1c= 6.4 ~  labs 2/11 showed BS= 76, A1c= 6.5 ~  Labs 5/12 showed BS= 103, A1c= 6.5 ~  She saw DrEllison 4/13 w/ A1c=7.1 but she rejects the diagnosis of Diabetes & refused Metform50038m... ~  10/13:  We discussed IMPAIRED GLUCOSE TOLERANCE & need for low carb wt reducing diet and incr exercise... ~  9/14: he refused meds; prev eval by DrEllison w/ rec for Metformin Rx but she rejects the DM diagnosis &  refused meds; states "I'm eating healthier" (advised low carb low fat), not checking sugars, prev labs w/ A1c=7.1  ~  3/15: on diet alone; Labs 3/15 showed BS= 95, A1c= 6.8 ~  9/15: on diet alone; Labs 9/15 showed BS= 119, A1c= 6.7 ~  3/16: on diet alone; Labs 2/16 in hosp showed BS~160, A1c=6.5  MORBID OBESITY (ICD-278.01) - she prev  saw [name] for counselling about her eating disorder... ~  max weight ~340# before lap band surg 7/09 by DrMartin. ~  weight 12/09 = 291# ~  weight 5/10 = 265# ~  weight 11/10- = 254# ~  weight 2/11 = 260# ~  Weight 5/12= 290# ~  Weight 11/12 = 286# ~  Weight 4/13 = 280# => BMI=45 ~  Weight 10/13 = 289# ~  Weight 9/14 = 281# ~  Weight 3/15 = 270# ~  Weight 9/15 = 267# ~  Weight 12/15 = 274# ~  Weight 3/16 = 255# => post hosp... ~  Weight 4/16 = 271# ~  Weight 7/16 = 272# ~  Weight 11/16 = 276#  ? Hx HYPOTHYROIDISM (ICD-244.9) - off thyroid meds since 1/10... she was started on Synthroid and followed by DrRSmith in [place] & last seen 7/09- note reviewed... she refuses f/u by [name]- we will recheck TSH off Synthroid Rx... ~  labs 5/10 off Synthroid since 1/10 showed TSH= 1.13 ~  labs 2/11 showed TSH= 1.82 ~  Labs 5/12 showed TSH= 1.81 ~  2013: She wanted to restart Thyroid hormone but TSH remains normal> referred to Endocrine for further eval but she was upset w/ DrEllison's eval; she will decide if she wants to pursue another opinion... ~  9/14: not currently on meds but she really wants thyroid Rx; prev TFTs all wnl; she prev had Endocrine in [place] prescribe Synthroid for her but she stopped going; clinically & biochemically euthyroid...  ~  3/15: on Synthroid100-12 tab daily; Labs showed TSH= 3.00 ~  She stopped the Synthroid again... ~  Labs 2/16 not on meds showed TSH=0.39 & FreeT4=1.54   GERD (ICD-530.81) - prev on Nexium but off all meds since 1/10... IRRITABLE BOWEL SYNDROME (ICD-564.1) - colonoscopy 1982 by DrPatterson was WNL... She has refused f/u GI eval & f/u colonoscopy...  ?KIDNEY STONE Hx >> pt said she passed a kidney stone 3/11 w/o any medical attention, w/o work up or proof of same; entry made here for future reference if needed.  UTERINE CANCER, HX OF (ICD-V10.42) - s/p laparoscopic hysterectomy & BSO 7/07 by DrSkinner at [place]...   DEGENERATIVE JOINT  DISEASE (ICD-715.90) - on VICODIN tid prn,  ANAPROX 2852m Bid prn, ULTRAM 518mPrn... ? 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 she was on Wellbutrin & Vicodin when last seen in 2006... VITAMIN D DEFICIENCY (ICD-268.9) - Vit D level 5/10 = 16... rec> start OTC Vit D 2000 u daily. ~  9/14: on Vicodin, Tramadol, Aleve, VitD, etc; prev eval by DrDeveshwar for rheum; now c/o pain in left knee (she thinks it's "siatica"), can't exercise & wants to see [facility]- ok... ~  3/15: she states that topical [name] ("horse linament") rubbed into knees really helps but she wants to keep the Vicodinon hand just in case...  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 she [place] psychiat[place] the [place]/ [place]  affective [place]... prev seeing psychologist [name] for eating disorder counselling... ~  4/13: she mentioned seeing [name] again for eating disorder & counseling for depression etc... 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  ~  3/15: she has the Alpraz0.532mfor prn use but seldom uses it; still sees [name] on occas but doesn't think she needs to continue... ~  11/15:  She wants antidepressant med & we discussed Zoloft tria 10068m1/2 => 1 tab daily... She still sees Psychologist [name]. ~  3/16:  She is off the Zoloft & states the AlpSharin Grave too strong (advised cutting the med & try lower dose for the desired effect...  HEALTH MAINTENANCE:   ~  GI:  Per seen by DrPatterson but last colon was 1982 & she refuses follow up... ~  GYN:  [facility] Immun[facility]:  She had Pneumovax in the past but  declines further immuniz- declines Flu shots and Tetanus shots...   Past Surgical History:  Procedure Laterality Date  . APPENDECTOMY    . BILATERAL SALPINGOOPHORECTOMY  2007   [place]  . CHOLECYSTECTOMY  1982  . LAPAROSCOPIC GASTRIC BANDING  08/2007   Dr. MarHassell Done LAPAROSCOPIC HYSTERECTOMY  2007   [place]  . 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 albuterol (VENTOLIN HFA) 108 (90 Base) MCG/ACT inhaler INHALE 2 PUFFS INTO THE LUNGS EVERY 6 HOURS AS NEEDED FOR WHEEZING OR SHORTNESS OF BREATH (Patient taking differently: 2 puffs. 2 puffs in the morning and 2 puffs at bedtime)  . ALPRAZolam (XANAX) 0.5 MG tablet Take 1 tab three times per day as needed for nerves MAX DAILY DOSE = 3 TABLETS  . Apple Cider Vinegar 500 MG TABS Take 1,000 mg by mouth daily.  . baclofen (LIORESAL) 10 MG tablet Take 10 mg by mouth. Taking 2 nightly  . cetirizine (ZYRTEC) 10 MG tablet Take 10 mg by mouth daily.    . Cholecalciferol (VITAMIN D-3) 5000 UNITS TABS Take 2 tablets by mouth daily.   . colchicine (COLCRYS) 0.6 MG tablet Take 1 tablet (0.6 mg total) by mouth daily. (Patient taking differently: Take 0.6 mg by mouth daily. As needed)  . cyanocobalamin 2000 MCG tablet Take 2,500 mcg by mouth 2 (two) times daily.  . fluticasone (FLONASE) 50 MCG/ACT nasal spray Place 2 sprays into both nostrils daily.  . furosemide (LASIX) 40 MG tablet TAKE 1-2 TABLETS ONCE A DAY AS NEEDED FOR SWELLING  . glucosamine-chondroitin 500-400 MG tablet Take 2 tablets by mouth 2 (two) times daily.   . gMarland KitchenaiFENesin (MUCINEX) 600 MG 12 hr tablet Take 1,600 mg by mouth 2 (two) times daily.  . iMarland Kitchenratropium-albuterol (DUONEB) 0.5-2.5 (3) MG/3ML SOLN Take 3 mLs by nebulization 3 (three) times daily.  . Magnesium 400 MG CAPS Take 3 capsules by mouth 2 (two) times daily.   . Misc Natural Products (TART CHERRY ADVANCED PO) Take by mouth.  . montelukast (SINGULAIR) 10 MG  tablet Take 1 tablet (10 mg total) by mouth at bedtime.  . naproxen sodium (ANAPROX) 220 MG tablet Take 220 mg by mouth daily. As needed  . NON FORMULARY Cinnamon 2 in the morning  . OXYGEN Inhale 4 L into the lungs.  .Marland Kitchen  RA KRILL OIL 500 MG CAPS Take by mouth.  . SPIRIVA HANDIHALER 18 MCG inhalation capsule PLACE 1 CAPSULE INTO INHALER AND INHALE INTO LUNGS DAILY  . SYMBICORT 160-4.5 MCG/ACT inhaler INHALE 2 PUFFS INTO THE LUNGS 2 TIMES DAILY  . traMADol (ULTRAM) 50 MG tablet Take 1 tablet (50 mg total) by mouth 3 (three) times daily as needed for moderate pain.  . TURMERIC PO Take 400 mg by mouth 2 (two) times daily.  . vitamin A 10000 UNIT capsule Take 5,000 Units by mouth daily.   . vitamin C (ASCORBIC ACID) 250 MG tablet Take 250 mg by mouth daily.  . Zinc 50 MG CAPS Take by mouth daily.  . [DISCONTINUED] ALPRAZolam (XANAX) 0.5 MG tablet TAKE 1/2 TO 1 TABLET BY MOUTH 3 TIMES DAILY AS NEEDED FOR NERVES. MAX DAILY DOSE = 3 TABLETS (Patient taking differently: 0.5 mg. Taking 1 1/2 at bedtime  MAX DAILY DOSE = 3 TABLETS)  . [DISCONTINUED] B Complex-C-Folic Acid (B-COMPLEX BALANCED) TABS Take 4 tablets by mouth 2 (two) times daily.   . [DISCONTINUED] B-Complex-C TABS Take by mouth.  . [DISCONTINUED] Coenzyme Q10 (COQ10) 100 MG CAPS Take by mouth.  . [DISCONTINUED] cyclobenzaprine (FLEXERIL) 10 MG tablet Take by mouth.  . [DISCONTINUED] Fluticasone-Salmeterol (ADVAIR) 500-50 MCG/DOSE AEPB Inhale into the lungs.  . [DISCONTINUED] Gabapentin (NEURONTIN PO) Take 500 mg by mouth at bedtime.  . [DISCONTINUED] gabapentin (NEURONTIN) 100 MG capsule TAKE TWO CAPSULES BY MOUTH EVERY NIGHT AT BEDTIME  . [DISCONTINUED] levalbuterol (XOPENEX) 1.25 MG/3ML nebulizer solution 1.25 mg.  . [DISCONTINUED] traZODone (DESYREL) 150 MG tablet Take 1 tablet (150 mg total) by mouth at bedtime as needed for sleep. (Patient taking differently: Take 50 mg by mouth at bedtime as needed for sleep. )  . [DISCONTINUED] vitamin  E 100 UNIT capsule Take by mouth daily.   No facility-administered encounter medications on file as of 02/07/2018.     Allergies  Allergen Reactions  . Penicillins     REACTION: nausea and vomiting    She repeatedly refuses all indicated & recommended adult immunizations...    Current Medications, Allergies, Past Medical History, Past Surgical History, Family History, and Social History were reviewed in [employer] record.   Review of Systems:         See HPI - all other systems neg except as noted... The patient complains of dyspnea on exertion and difficulty walking; chest discomfort, & leg weakness.  The patient denies anorexia, fever, weight loss, weight gain, vision loss, decreased hearing, hoarseness, syncope, prolonged cough, headaches, hemoptysis, abdominal pain, melena, hematochezia, severe indigestion/heartburn, hematuria, incontinence, suspicious skin lesions, transient blindness, depression, unusual weight change, abnormal bleeding, enlarged lymph nodes, and angioedema.    Objective:   Physical Exam:    WD, Morbidly Obese, 67 y/o WF in no acute distress but c/o dyspnea/ wheezing... GENERAL:  Alert & oriented; pleasant & cooperative... HEENT:  [place]/AT, EOM-wnl, PERRLA, EACs-clear, TMs-wnl, NOSE-clear, THROAT-clear & wnl. NECK:  Supple w/ fairROM; no JVD; normal carotid impulses w/o bruits; no thyromegaly or nodules palpated; no lymphadenopathy. CHEST:  no accessory musc use, decr BS bilat, no wheezing/ rales/ rhonchi... HEART:  Regular Rhythm; without murmurs/ rubs/ or gallops heard... ABDOMEN:  Obese, soft & nontender; normal bowel sounds; no organomegaly or masses detected. EXT: without deformities, mod arthritic changes; no varicose veins/ +venous insuffic/trace edema. NEURO:  CNs intact; no focal neuro deficits... DERM: few ecchymoses, no rash etc...  RADIOLOGY DATA:  Reviewed in the EPIC EMR &  discussed w/ the patient...  LABORATORY DATA:   Reviewed in the EPIC EMR & discussed w/ the patient...   Assessment & Plan:    ENT>> she was working w/ DrPlonk at [employer] in [place]...  OSA>  As prev noted she needs new sleep study & appropriate new CPAP apparatus to facilitate compliance but she declines the repeat study or sleep med referral...  COPD/ Emphysema>  Acute exac 2/16 improved after hosp in [place] w/ Levaquin, Pred taper, NEBS Bid, Avair500, Spiriva, Mucinex etc... Encouraged to stay on her O2 regularly & we will add Humidity, needs max nasal hygiene vs ENT follow up; continue NEBS/ Advair500/ Spiriva regularly & use the Mucinex; we will [facility] the [facility] home COPD program; needs to lose wt & incr exercise... 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 5/16>  She has seen ENT & Pulm 2nd opinion is pending; she cannot manipulate the port O2 tanks and we will request an INOGEN One- port O2 concentrator for her use...  5/16>  States breathing worse, asked to use all regular meds regularly & we will add Depo120, Medrol taper... 6/16>  She had 2nd opinion consult w/ DrStephens/ DrHaponik [facility] see above... 10/16>  Pt very frustrated after working w/ the [employer] division at [employer] for several months- Nebs, Advair500, Spiriva, Medrol, Oxygen, pulm rehab, etc...  11/16>  On Medrol43m- she incr herself to 661md, Symbicort160-2spBid, Spiriva daily, Xopenex NEBS Tid (only doing it once/d), Guaifenesin400- taking 6/d w/ fluids, O2- she is not using it at rest, using 5L/min w/ exercise; she takes Lasix40, Tamadol50, & Xanax0.5 as needed... 8/17>  She is now taking an internet supplement CBD oil (hemp oil) under her tongue daily- "it's good for your lungs" & she has weaned down her O2 and Medrol; Advised to continue O2- 2L/min at rest & 4L/min w/ exercise and take her other meds regularly...    02/10/16>   Overall GiArtis Delays stable- she requests 90d refill prescriptions and we did this;  We reviewed her labs, XRays, & PFTs- rec to take meds regularly & not adjust on her own, rec to return to exercise program & consider pulm rehab. 04/16/16>    I wrote a prescription for a POC at her insistence;  We will check w/ other DME companies for her;  She is rec to continue the NEB w/ Duoneb tid followed by the Symbicort160-2spBid & the Spiriva once daily;  Maximize the GUAIFENESIN 40028mabs- 2Tid w/ fluids & work to expectorate the phlegm;  She still needs a regular exercise program/ pulm rehab & we discussed this... we plan rov in 3-33mo81mo6/18>   I have asked MsCook to be regular w/ her Duoneb Tid, Symbicort160-2spBid & spiriva once daily;  Ok to use her supplements and "special oil" that she feels has been very beneficial to her in many ways... we plan ROV recheck in 33mon50month19/18>   She is stable and credits the CBD oil she is using; recommended to use her inhalers regularly including- O2 at prescribed, DUONEB-Tid, Symbicort-Bid, Spiriva daily, GFN400-2Tid w/ fluids, ProventilHFA as needed;  She refuses the FLU vaccine... 03/16/17>   [name] iArtis Delaytable on her FECO oil but I have reminded her to STAY on her O2, DUONEB Tid, Symbicort Bid, Spiriva daily, & Mucinex, fluids, etc;  CXR & LABS look good...Marland KitchenMarland Kitchen  07/15/17>   Artis Delay is reminded to follow the prescribed regimen-- O2 at 2L/min rest & 4L/min exerc; NEB w/ DUONEB TID, SYMBICORT160-2spBid, SPIRIVA once daily, AlbutHFA rescue as needed; MUCINEX 1269m Bid, fluids, Zyrtek & Flonase;  She has a long list of supplement medications that she ascribes to including FECO oil... I have nothing else to offer her at this point. 11/15/17>   GArtis Delayis asked to continue her NEB w/ DUONEB Tid regularly, followed by Symbicort160- 2spBid & Spirva handihaler once daily; she has home O2, Mucinex, Zyrtek, Flonase;  We are adding the Singulair 163mQd as a trial;  I told her we would look  into the interventional Zephyr valve therapy;  In the meanwhile we will set up appts w/ Urology & Dermatology per her requests...  I indicated to her that I would be retiring at the end of the yr & she requested one additional visit in Dec2019 02/07/18>   GiArtis Delays all over the place w/ her symptoms today, however her breathing appears to be at baseline & I again stressed the need for regular med regimen as above;  In light of my up-coming retirement- she has arranged for her own Gen Medicine and Pulm follow up visits as noted.   Hx CP w/ neg cardiac evals including prev Myoviews; 2DEchos w/o incr PA pressures... 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 2/16> [facility] and norm RV...  OBESITY>  She had remote lap band surg by DrMMartin; Met syndrome, must get wt down etc; she is a lap band failure & prev counseling by [name]... 3/16> weight down to 255# post hosp... 4/16> weight is back up to 271# despite all efforts... 8/17> weight = 271#  ? Hx Hypothy>  TFTs remain normal off meds- no problem... 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  GI>  GERD> she denies symptoms & uses OTC meds prn...  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 C/O right ankle pain, eval by DrZSmith> no better on his anti-inflamm rx so we will Rx w/ Depo120 & Medrol taper... Neck pain followed by DrOrson Aloe DrNewton...  Dysthymia>  Much stress in her life, on Alpraz prn; has embraced alt lifestyle...   Patient's Medications  New Prescriptions   No medications on file  Previous Medications   ALBUTEROL (VENTOLIN HFA) 108  (90 BASE) MCG/ACT INHALER    INHALE 2 PUFFS INTO THE LUNGS EVERY 6 HOURS AS NEEDED FOR WHEEZING OR SHORTNESS OF BREATH   APPLE CIDER VINEGAR 500 MG TABS    Take 1,000 mg by mouth daily.   BACLOFEN (LIORESAL) 10 MG TABLET    Take 10 mg by mouth. Taking 2 nightly   CETIRIZINE (ZYRTEC) 10 MG TABLET    Take 10 mg by mouth daily.     CHOLECALCIFEROL (VITAMIN D-3) 5000 UNITS TABS    Take 2 tablets by mouth daily.    COLCHICINE (COLCRYS) 0.6 MG TABLET    Take 1 tablet (0.6 mg total) by mouth daily.   CYANOCOBALAMIN 2000 MCG TABLET    Take 2,500 mcg  by mouth 2 (two) times daily.   FLUTICASONE (FLONASE) 50 MCG/ACT NASAL SPRAY    Place 2 sprays into both nostrils daily.   FUROSEMIDE (LASIX) 40 MG TABLET    TAKE 1-2 TABLETS ONCE A DAY AS NEEDED FOR SWELLING   GLUCOSAMINE-CHONDROITIN 500-400 MG TABLET    Take 2 tablets by mouth 2 (two) times daily.    GUAIFENESIN (MUCINEX) 600 MG 12 HR TABLET    Take 1,600 mg by mouth 2 (two) times daily.   IPRATROPIUM-ALBUTEROL (DUONEB) 0.5-2.5 (3) MG/3ML SOLN    Take 3 mLs by nebulization 3 (three) times daily.   MAGNESIUM 400 MG CAPS    Take 3 capsules by mouth 2 (two) times daily.    MISC NATURAL PRODUCTS (TART CHERRY ADVANCED PO)    Take by mouth.   MONTELUKAST (SINGULAIR) 10 MG TABLET    Take 1 tablet (10 mg total) by mouth at bedtime.   NAPROXEN SODIUM (ANAPROX) 220 MG TABLET    Take 220 mg by mouth daily. As needed   NON FORMULARY    Cinnamon 2 in the morning   OXYGEN    Inhale 4 L into the lungs.   RA KRILL OIL 500 MG CAPS    Take by mouth.   SPIRIVA HANDIHALER 18 MCG INHALATION CAPSULE    PLACE 1 CAPSULE INTO INHALER AND INHALE INTO LUNGS DAILY   SYMBICORT 160-4.5 MCG/ACT INHALER    INHALE 2 PUFFS INTO THE LUNGS 2 TIMES DAILY   TRAMADOL (ULTRAM) 50 MG TABLET    Take 1 tablet (50 mg total) by mouth 3 (three) times daily as needed for moderate pain.   TURMERIC PO    Take 400 mg by mouth 2 (two) times daily.   VITAMIN A 02111 UNIT CAPSULE    Take 5,000 Units by  mouth daily.    VITAMIN C (ASCORBIC ACID) 250 MG TABLET    Take 250 mg by mouth daily.   ZINC 50 MG CAPS    Take by mouth daily.  Modified Medications   Modified Medication Previous Medication   ALPRAZOLAM (XANAX) 0.5 MG TABLET ALPRAZolam (XANAX) 0.5 MG tablet      Take 1 tab three times per day as needed for nerves MAX DAILY DOSE = 3 TABLETS    TAKE 1/2 TO 1 TABLET BY MOUTH 3 TIMES DAILY AS NEEDED FOR NERVES. MAX DAILY DOSE = 3 TABLETS  Discontinued Medications   B COMPLEX-C-FOLIC ACID (B-COMPLEX BALANCED) TABS    Take 4 tablets by mouth 2 (two) times daily.    B-COMPLEX-C TABS    Take by mouth.   COENZYME Q10 (COQ10) 100 MG CAPS    Take by mouth.   CYCLOBENZAPRINE (FLEXERIL) 10 MG TABLET    Take by mouth.   FLUTICASONE-SALMETEROL (ADVAIR) 500-50 MCG/DOSE AEPB    Inhale into the lungs.   GABAPENTIN (NEURONTIN PO)    Take 500 mg by mouth at bedtime.   GABAPENTIN (NEURONTIN) 100 MG CAPSULE    TAKE TWO CAPSULES BY MOUTH EVERY NIGHT AT BEDTIME   LEVALBUTEROL (XOPENEX) 1.25 MG/3ML NEBULIZER SOLUTION    1.25 mg.   TRAZODONE (DESYREL) 150 MG TABLET    Take 1 tablet (150 mg total) by mouth at bedtime as needed for sleep.   VITAMIN E 100 UNIT CAPSULE    Take by mouth daily.

## 2018-02-07 NOTE — Patient Instructions (Addendum)
Today we updated your med list in our EPIC system...    Continue your current medications the same...  We will request a GYN follow up eval by Dr. Reyes Ivan in [place] office per your request...  We also refilled your Xanax per our discussion...  If the Baclofen is not working for the nocturnal leg cramps- then wean off over the next 2 weeks & let me know if the cramps are worse off this muscle relaxer...  We discussed your pulmonary follow up w/ Dr. Romero Belling at [facility]; all of our records/ XRay reports/ labs/ etc are avail to him thru the Epic EMR system.Marland KitchenMarland Kitchen

## 2018-02-18 DIAGNOSIS — R0989 Other specified symptoms and signs involving the circulatory and respiratory systems: Secondary | ICD-10-CM | POA: Diagnosis not present

## 2018-02-18 DIAGNOSIS — G4733 Obstructive sleep apnea (adult) (pediatric): Secondary | ICD-10-CM | POA: Diagnosis not present

## 2018-02-18 DIAGNOSIS — J849 Interstitial pulmonary disease, unspecified: Secondary | ICD-10-CM | POA: Diagnosis not present

## 2018-02-18 DIAGNOSIS — J449 Chronic obstructive pulmonary disease, unspecified: Secondary | ICD-10-CM | POA: Diagnosis not present

## 2018-03-08 ENCOUNTER — Ambulatory Visit (INDEPENDENT_AMBULATORY_CARE_PROVIDER_SITE_OTHER): Payer: Medicare HMO | Admitting: Internal Medicine

## 2018-03-08 ENCOUNTER — Encounter: Payer: Self-pay | Admitting: Internal Medicine

## 2018-03-08 VITALS — BP 130/80 | HR 110 | Temp 98.4°F | Ht 65.0 in | Wt 261.7 lb

## 2018-03-08 DIAGNOSIS — J9621 Acute and chronic respiratory failure with hypoxia: Secondary | ICD-10-CM | POA: Diagnosis not present

## 2018-03-08 DIAGNOSIS — J441 Chronic obstructive pulmonary disease with (acute) exacerbation: Secondary | ICD-10-CM | POA: Diagnosis not present

## 2018-03-08 MED ORDER — METHYLPREDNISOLONE ACETATE 80 MG/ML IJ SUSP
80.0000 mg | Freq: Once | INTRAMUSCULAR | Status: AC
  Administered 2018-03-08: 80 mg via INTRAMUSCULAR

## 2018-03-08 MED ORDER — IPRATROPIUM-ALBUTEROL 0.5-2.5 (3) MG/3ML IN SOLN
3.0000 mL | Freq: Once | RESPIRATORY_TRACT | Status: AC
  Administered 2018-03-08: 3 mL via RESPIRATORY_TRACT

## 2018-03-08 MED ORDER — AZITHROMYCIN 500 MG PO TABS: 500.0000 mg | ORAL_TABLET | Freq: Every day | ORAL | 0 refills | Status: AC

## 2018-03-08 MED ORDER — PREDNISONE 10 MG (21) PO TBPK: ORAL_TABLET | ORAL | 0 refills | Status: DC

## 2018-03-08 NOTE — Patient Instructions (Signed)
-Was nice meeting you today!  -It is still my recommendation that you proceed to the emergency department for further evaluation given your acute flareup of COPD and low oxygen levels.  Since you are refusing, will give you a prednisone taper for 6 days and 5 days of azithromycin 500 mg daily which is an antibiotic.  -Please proceed to the emergency department immediately if you develop worsening shortness of breath, chest pain or continue to have low oxygen levels.  -Please schedule follow-up with me in 2 weeks.  -For now wear the oxygen 24/7 as opposed to with activity only.   Chronic Obstructive Pulmonary Disease Exacerbation Chronic obstructive pulmonary disease (COPD) is a long-term (chronic) lung problem. In COPD, the flow of air from the lungs is limited. COPD exacerbations are times that breathing gets worse and you need more than your normal treatment. Without treatment, they can be life threatening. If they happen often, your lungs can become more damaged. If your COPD gets worse, your doctor may treat you with:  Medicines.  Oxygen.  Different ways to clear your airway, such as using a mask. Follow these instructions at home: Medicines  Take over-the-counter and prescription medicines only as told by your doctor.  If you take an antibiotic or steroid medicine, do not stop taking the medicine even if you start to feel better.  Keep up with shots (vaccinations) as told by your doctor. Be sure to get a yearly (annual) flu shot. Lifestyle  Do not smoke. If you need help quitting, ask your doctor.  Eat healthy foods.  Exercise regularly.  Get plenty of sleep.  Avoid tobacco smoke and other things that can bother your lungs.  Wash your hands often with soap and water. This will help keep you from getting an infection. If you cannot use soap and water, use hand sanitizer.  During flu season, avoid areas that are crowded with people. General instructions  Drink enough  fluid to keep your pee (urine) clear or pale yellow. Do not do this if your doctor has told you not to.  Use a cool mist machine (vaporizer).  If you use oxygen or a machine that turns medicine into a mist (nebulizer), continue to use it as told.  Follow all instructions for rehabilitation. These are steps you can take to make your body work better.  Keep all follow-up visits as told by your doctor. This is important. Contact a doctor if:  Your COPD symptoms get worse than normal. Get help right away if:  You are short of breath and it gets worse.  You have trouble talking.  You have chest pain.  You cough up blood.  You have a fever.  You keep throwing up (vomiting).  You feel weak or you pass out (faint).  You feel confused.  You are not able to sleep because of your symptoms.  You are not able to do daily activities. Summary  COPD exacerbations are times that breathing gets worse and you need more treatment than normal.  COPD exacerbations can be very serious and may cause your lungs to become more damaged.  Do not smoke. If you need help quitting, ask your doctor.  Stay up-to-date on your shots. Get a flu shot every year. This information is not intended to replace advice given to you by your health care provider. Make sure you discuss any questions you have with your health care provider. Document Released: 02/05/2011 Document Revised: 03/23/2016 Document Reviewed: 03/23/2016 Elsevier Interactive Patient Education  2019 [place].

## 2018-03-08 NOTE — Progress Notes (Signed)
Acute Office Visit     CC/Reason for Visit: Establish care, shortness of breath  HPI: Rebecca [name] is a 68 y.o. female who is coming in today for the above mentioned reasons. Past Medical History is significant for: COPD with chronic hypoxemic respiratory failure who is on 3 to 5 L of oxygen at baseline with activity. Her COPD had been followed by Dr. Lenna Gilford, but he has now retired.  She has an appointment to see [facility] in April to see if she is a candidate for Zephyr valve treatment, she is also trying to set up her pulmonary care with [facility]  The purpose of this visit was to establish care, however I have transitioned this over to an acute visit given her significant ongoing complaints.  She states "my lungs have been giving me a heck of a time".  She has been significantly short of breath, nonproductive cough, states her pulse oximeter has been reading in the low 70s at home despite oxygen therapy.  She takes Symbicort, Spiriva and a steroid via nebulizer without fail every day, also uses albuterol as needed but feels like lately she has been using it every 2 hours.  She has had no relief with frequent albuterol treatments.  Here in the office her pulse ox was initially 93% but easily drops into the high 70s to low 80s while sitting in a chair and talking despite oxygen at 3 to 4 L.  She does have pursed lip breathing.  She has not had a recent travel or sick contacts, denies URI symptoms.  Denies fever or chills.  Denies chest pain.  Past Medical/Surgical History: Past Medical History:  Diagnosis Date  . Chest pain   . Chronic venous insufficiency   . COPD (chronic obstructive pulmonary disease) ([place])   . DJD (degenerative joint disease)   . Dysthymia   . Fibromyalgia   . GERD (gastroesophageal reflux disease)   . History of headache   . Hodgkin lymphoma of extranodal or solid organ site [facility])   . Hypothyroid   . IBS (irritable bowel syndrome)   .  Metabolic syndrome X   . Morbid obesity ([place])   . OSA (obstructive sleep apnea)   . Vitamin D deficiency     Past Surgical History:  Procedure Laterality Date  . APPENDECTOMY    . BILATERAL SALPINGOOPHORECTOMY  2007   [facility]  . CHOLECYSTECTOMY  1982  . LAPAROSCOPIC GASTRIC BANDING  08/2007   Dr. Hassell Done  . LAPAROSCOPIC HYSTERECTOMY  2007   [facility]  . 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 Her smoking use included cigarettes. She smoked 1.00 pack per day. She has never used smokeless tobacco. She reports that she does not drink alcohol or use drugs.  Allergies: Allergies  Allergen Reactions  . Penicillins     REACTION: nausea and vomiting    Family History:  Family History  Problem Relation Age of Onset  . Heart failure Father   . Cancer Mother   . Parkinson's disease Mother   . Heart attack Sister      Current Outpatient Medications:  .  albuterol (VENTOLIN HFA) 108 (90 Base) MCG/ACT inhaler, INHALE 2 PUFFS INTO THE LUNGS EVERY 6 HOURS AS NEEDED FOR WHEEZING OR SHORTNESS OF BREATH (Patient taking differently: 2 puffs. 2 puffs in the morning and 2 puffs at bedtime), Disp: 54 each,  Rfl: 4 .  ALPRAZolam (XANAX) 0.5 MG tablet, Take 1 tab three times per day as needed for nerves MAX DAILY DOSE = 3 TABLETS, Disp: 270 tablet, Rfl: 1 .  Apple Cider Vinegar 500 MG TABS, Take 1,000 mg by mouth daily., Disp: , Rfl:  .  Ascorbic Acid (VITAMIN C) 1000 MG tablet, Take 1,000 mg by mouth daily., Disp: , Rfl:  .  baclofen (LIORESAL) 10 MG tablet, Take 10 mg by mouth. Taking 2 nightly, Disp: , Rfl:  .  cetirizine (ZYRTEC) 10 MG tablet, Take 10 mg by mouth daily.  , Disp: , Rfl:  .  Cholecalciferol (VITAMIN D-3) 5000 UNITS TABS, Take 1 tablet by mouth daily. , Disp: , Rfl:  .  colchicine (COLCRYS) 0.6 MG tablet, Take 1 tablet (0.6 mg total) by mouth daily. (Patient taking differently: Take 0.6 mg by mouth daily. As  needed), Disp: 30 tablet, Rfl: 11 .  cyanocobalamin 2000 MCG tablet, Take 2,500 mcg by mouth 2 (two) times daily., Disp: , Rfl:  .  fluticasone (FLONASE) 50 MCG/ACT nasal spray, Place 2 sprays into both nostrils daily., Disp: , Rfl:  .  furosemide (LASIX) 40 MG tablet, TAKE 1-2 TABLETS ONCE A DAY AS NEEDED FOR SWELLING, Disp: 60 tablet, Rfl: 11 .  glucosamine-chondroitin 500-400 MG tablet, Take 2 tablets by mouth 2 (two) times daily. , Disp: , Rfl:  .  guaiFENesin (MUCINEX) 600 MG 12 hr tablet, Take 1,600 mg by mouth 2 (two) times daily., Disp: , Rfl:  .  ipratropium-albuterol (DUONEB) 0.5-2.5 (3) MG/3ML SOLN, Take 3 mLs by nebulization 3 (three) times daily., Disp: 270 mL, Rfl: 7 .  Magnesium 400 MG CAPS, Take 3 capsules by mouth 2 (two) times daily. , Disp: , Rfl:  .  Misc Natural Products (TART CHERRY ADVANCED PO), Take by mouth., Disp: , Rfl:  .  montelukast (SINGULAIR) 10 MG tablet, Take 1 tablet (10 mg total) by mouth at bedtime., Disp: 30 tablet, Rfl: 6 .  naproxen sodium (ANAPROX) 220 MG tablet, Take 220 mg by mouth daily. As needed, Disp: , Rfl:  .  NON FORMULARY, Cinnamon 2 in the morning, Disp: , Rfl:  .  OXYGEN, Inhale 4 L into the lungs., Disp: , Rfl:  .  SPIRIVA HANDIHALER 18 MCG inhalation capsule, PLACE 1 CAPSULE INTO INHALER AND INHALE INTO LUNGS DAILY, Disp: 90 capsule, Rfl: 10 .  SYMBICORT 160-4.5 MCG/ACT inhaler, INHALE 2 PUFFS INTO THE LUNGS 2 TIMES DAILY, Disp: 1 Inhaler, Rfl: 5 .  traMADol (ULTRAM) 50 MG tablet, Take 1 tablet (50 mg total) by mouth 3 (three) times daily as needed for moderate pain., Disp: 90 tablet, Rfl: 3 .  TURMERIC PO, Take 400 mg by mouth 2 (two) times daily., Disp: , Rfl:  .  vitamin A 10000 UNIT capsule, Take 5,000 Units by mouth daily. , Disp: , Rfl:  .  Zinc 50 MG CAPS, Take by mouth daily., Disp: , Rfl:  .  azithromycin (ZITHROMAX) 500 MG tablet, Take 1 tablet (500 mg total) by mouth daily for 5 days., Disp: 5 tablet, Rfl: 0 .  predniSONE  (STERAPRED UNI-PAK 21 TAB) 10 MG (21) TBPK tablet, Take as directed, Disp: 21 tablet, Rfl: 0  Review of Systems:  Constitutional: Denies fever, chills, diaphoresis, appetite change and fatigue.  HEENT: Denies photophobia, eye pain, redness, hearing loss, ear pain, congestion, sore throat, rhinorrhea, sneezing, mouth sores, trouble swallowing, neck pain, neck stiffness and tinnitus.   Respiratory: Positive for SOB, DOE, cough,  denies chest tightness,  and wheezing.   Cardiovascular: Denies chest pain, palpitations and leg swelling.  Gastrointestinal: Denies nausea, vomiting, abdominal pain, diarrhea, constipation, blood in stool and abdominal distention.  Genitourinary: Denies dysuria, urgency, frequency, hematuria, flank pain and difficulty urinating.  Endocrine: Denies: hot or cold intolerance, sweats, changes in hair or nails, polyuria, polydipsia. Musculoskeletal: Denies myalgias, back pain, joint swelling, arthralgias and gait problem.  Skin: Denies pallor, rash and wound.  Neurological: Denies dizziness, seizures, syncope, weakness, light-headedness, numbness and headaches.  Hematological: Denies adenopathy. Easy bruising, personal or family bleeding history  Psychiatric/Behavioral: Denies suicidal ideation, mood changes, confusion, nervousness, sleep disturbance and agitation    Physical Exam: Vitals:   03/08/18 1254  BP: 130/80  Pulse: (!) 110  Temp: 98.4 F (36.9 C)  TempSrc: Oral  SpO2: (!) 85%  Weight: 261 lb 11.2 oz (118.7 kg)  Height: 5\' 5"  (1.651 m)    Body mass index is 43.55 kg/m.   Constitutional: Pursed lip breathing, unable to speak in full sentences due to shortness of breath. Eyes: PERRL, lids and conjunctivae normal ENMT: Mucous membranes are dry. Respiratory: Increased respiratory effort, using accessory muscles of respiration, no wheezes but poor air movement. Cardiovascular: Tachycardic, regular rhythm, no murmurs / rubs / gallops. No extremity edema. 2+  pedal pulses. No carotid bruits.  Abdomen: Obese, soft, nontender, nondistended, positive bowel sounds.   Musculoskeletal: no clubbing / cyanosis. No joint deformity upper and lower extremities. Good ROM, no contractures. Normal muscle tone.  Skin: no rashes, lesions, ulcers. No induration Neurologic: CN 2-12 grossly intact. Sensation intact, DTR normal. Strength 5/5 in all 4.  Psychiatric: Normal judgment and insight. Alert and oriented x 3. Normal mood.    Impression and Plan:  COPD with acute exacerbation ([place]) Acute on chronic respiratory failure with hypoxemia ([place]) -This is worsening and potentially life-threatening due to significant degree of hypoxemia. -We will give 80 mg of Depo-Medrol in the office as well as an albuterol nebulization. -Further recommendations pending evaluation after nebulization, suspect it would be in her best interest to proceed to the emergency department for further evaluation.  She seems very hesitant to be hospitalized.  Addendum: Post nebulizer oxygen level is 79% on room air and 85% on 3 L.  She is moving a little more air but remains tight.  My recommendation still stands for emergency department visit and possible admission, however she refuses.  In light of her refusal, will give her a prednisone taper as well as 5 days of azithromycin.  Have recommended that she proceed immediately to the emergency department if she develops worsening shortness of breath, chest pain or continues to have low oxygen levels.  Otherwise I will see her back in the office in 2 weeks to monitor her progress.    Patient Instructions  -Was nice meeting you today!  -It is still my recommendation that you proceed to the emergency department for further evaluation given your acute flareup of COPD and low oxygen levels.  Since you are refusing, will give you a prednisone taper for 6 days and 5 days of azithromycin 500 mg daily which is an antibiotic.  -Please proceed to the  emergency department immediately if you develop worsening shortness of breath, chest pain or continue to have low oxygen levels.  -Please schedule follow-up with me in 2 weeks.  -For now wear the oxygen 24/7 as opposed to with activity only.   Chronic Obstructive Pulmonary Disease Exacerbation Chronic obstructive pulmonary disease (COPD) is a  long-term (chronic) lung problem. In COPD, the flow of air from the lungs is limited. COPD exacerbations are times that breathing gets worse and you need more than your normal treatment. Without treatment, they can be life threatening. If they happen often, your lungs can become more damaged. If your COPD gets worse, your doctor may treat you with:  Medicines.  Oxygen.  Different ways to clear your airway, such as using a mask. Follow these instructions at home: Medicines  Take over-the-counter and prescription medicines only as told by your doctor.  If you take an antibiotic or steroid medicine, do not stop taking the medicine even if you start to feel better.  Keep up with shots (vaccinations) as told by your doctor. Be sure to get a yearly (annual) flu shot. Lifestyle  Do not smoke. If you need help quitting, ask your doctor.  Eat healthy foods.  Exercise regularly.  Get plenty of sleep.  Avoid tobacco smoke and other things that can bother your lungs.  Wash your hands often with soap and water. This will help keep you from getting an infection. If you cannot use soap and water, use hand sanitizer.  During flu season, avoid areas that are crowded with people. General instructions  Drink enough fluid to keep your pee (urine) clear or pale yellow. Do not do this if your doctor has told you not to.  Use a cool mist machine (vaporizer).  If you use oxygen or a machine that turns medicine into a mist (nebulizer), continue to use it as told.  Follow all instructions for rehabilitation. These are steps you can take to make your body  work better.  Keep all follow-up visits as told by your doctor. This is important. Contact a doctor if:  Your COPD symptoms get worse than normal. Get help right away if:  You are short of breath and it gets worse.  You have trouble talking.  You have chest pain.  You cough up blood.  You have a fever.  You keep throwing up (vomiting).  You feel weak or you pass out (faint).  You feel confused.  You are not able to sleep because of your symptoms.  You are not able to do daily activities. Summary  COPD exacerbations are times that breathing gets worse and you need more treatment than normal.  COPD exacerbations can be very serious and may cause your lungs to become more damaged.  Do not smoke. If you need help quitting, ask your doctor.  Stay up-to-date on your shots. Get a flu shot every year. This information is not intended to replace advice given to you by your health care provider. Make sure you discuss any questions you have with your health care provider. Document Released: 02/05/2011 Document Revised: 03/23/2016 Document Reviewed: 03/23/2016 Elsevier Interactive Patient Education  2019 [place], MD [facility]

## 2018-03-09 ENCOUNTER — Telehealth: Payer: Self-pay | Admitting: Internal Medicine

## 2018-03-09 NOTE — Telephone Encounter (Signed)
Copied from [place] [id number]. Topic: Quick Communication - See Telephone Encounter >> Mar 09, 2018 12:01 PM Rayann Heman wrote: CRM for notification. See Telephone encounter for: 03/09/18. Pt called and stated that she would like dr Jerilee Hoh to know that she is feeling much better. She states that he oxygen is running in the low 80's today.  Pt does not feel like she needs to go to the hospital. Please advise.

## 2018-03-09 NOTE — Telephone Encounter (Signed)
As I told her yesterday, my recommendation still stands for an ED visit and possible admission. She is still quite hypoxemic. Let her know that I appreciate the phone call and hope she continues to improve.

## 2018-03-10 NOTE — Telephone Encounter (Signed)
Spoke with patient and she "feels much better".  "if I run into trouble there is a hospital in [place]".

## 2018-03-17 DIAGNOSIS — C541 Malignant neoplasm of endometrium: Secondary | ICD-10-CM | POA: Diagnosis not present

## 2018-03-17 DIAGNOSIS — K429 Umbilical hernia without obstruction or gangrene: Secondary | ICD-10-CM | POA: Diagnosis not present

## 2018-03-22 ENCOUNTER — Ambulatory Visit (INDEPENDENT_AMBULATORY_CARE_PROVIDER_SITE_OTHER): Payer: Medicare HMO | Admitting: Internal Medicine

## 2018-03-22 ENCOUNTER — Encounter: Payer: Self-pay | Admitting: Internal Medicine

## 2018-03-22 VITALS — BP 110/70 | HR 91 | Temp 98.3°F | Wt 259.1 lb

## 2018-03-22 DIAGNOSIS — R252 Cramp and spasm: Secondary | ICD-10-CM | POA: Diagnosis not present

## 2018-03-22 DIAGNOSIS — N183 Chronic kidney disease, stage 3 unspecified: Secondary | ICD-10-CM | POA: Insufficient documentation

## 2018-03-22 DIAGNOSIS — J449 Chronic obstructive pulmonary disease, unspecified: Secondary | ICD-10-CM | POA: Diagnosis not present

## 2018-03-22 LAB — BASIC METABOLIC PANEL
BUN: 30 mg/dL — ABNORMAL HIGH (ref 6–23)
CO2: 34 mEq/L — ABNORMAL HIGH (ref 19–32)
Calcium: 9.8 mg/dL (ref 8.4–10.5)
Chloride: 100 mEq/L (ref 96–112)
Creatinine, Ser: 1.39 mg/dL — ABNORMAL HIGH (ref 0.40–1.20)
GFR: 37.54 mL/min — ABNORMAL LOW (ref >60.00)
Glucose, Bld: 108 mg/dL — ABNORMAL HIGH (ref 70–99)
Potassium: 5 mEq/L (ref 3.5–5.1)
Sodium: 141 mEq/L (ref 135–145)

## 2018-03-22 LAB — MAGNESIUM: Magnesium: 2.5 mg/dL (ref 1.5–2.5)

## 2018-03-22 NOTE — Progress Notes (Signed)
Established Patient Office Visit     CC/Reason for Visit: copd excerbation f/u  HPI: Rebecca [name] is a 68 y.o. female who is coming in today for the above mentioned reason. Patient was recently seen here at the office for a copd excerbation and was referred to the ED.  Patient refused going due to her dogs at home and her elderly roommate that she helps take care of.  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  She has been tx by Dr. Lenna Gilford with pulmonology, but he has recently retired.  Patient has an appt with [facility] the first week of February.  Patient sts she feels "much better" today.  She just finished a round of prednisone and azithromycin.  She wears 3L of o2 at night and 4L of o2 with any type of excertion.  She sts when she is sitting down during the day she is on RA.  Today she is 89% on RA with some labored breathing.  She wants to learn more about a new "valve" procedure that just came out when she goes to see Pulmonology in a couple of weeks.  She c/o nose and mouth dryness due to the oxygen.  She does admit to having a productive cough with clear phelm.  She denies fever or chills.  She sts she uses her spiriva, symbicort and albuterol.  She uses albuterol about 6x a day.  Patient also c/o hand and leg cramps that randomly come and go.  She sts that if she can have 2-3 bowel movements a day, then her cramps improve.     Past Medical/Surgical History: Past Medical History:  Diagnosis Date  . Chest pain   . Chronic venous insufficiency   . COPD (chronic obstructive pulmonary disease) ([place])   . DJD (degenerative joint disease)   . Dysthymia   . Fibromyalgia   . GERD (gastroesophageal reflux disease)   . History of headache   . Hodgkin lymphoma of extranodal or solid organ site [facility])   . Hypothyroid   . IBS (irritable bowel syndrome)   . Metabolic syndrome X   . Morbid obesity ([place])   . OSA (obstructive sleep apnea)   . Vitamin D  deficiency     Past Surgical History:  Procedure Laterality Date  . APPENDECTOMY    . BILATERAL SALPINGOOPHORECTOMY  2007   [facility]  . CHOLECYSTECTOMY  1982  . LAPAROSCOPIC GASTRIC BANDING  08/2007   Dr. Hassell Done  . LAPAROSCOPIC HYSTERECTOMY  2007   [facility]  . 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 Her smoking use included cigarettes. She smoked 1.00 pack per day. She has never used smokeless tobacco. She reports that she does not drink alcohol or use drugs.  Allergies: Allergies  Allergen Reactions  . Penicillins Nausea Only and Nausea And Vomiting    REACTION: nausea and vomiting REACTION: nausea and vomiting REACTION: nausea and vomiting    Family History:  Family History  Problem Relation Age of Onset  . Heart failure Father   . Cancer Mother   . Parkinson's disease Mother   . Heart attack Sister      Current Outpatient Medications:  .  albuterol (VENTOLIN HFA) 108 (90 Base) MCG/ACT inhaler, INHALE 2 PUFFS INTO THE LUNGS EVERY 6 HOURS AS NEEDED FOR WHEEZING OR SHORTNESS OF BREATH (Patient taking differently:  2 puffs. 2 puffs in the morning and 2 puffs at bedtime), Disp: 54 each, Rfl: 4 .  ALPRAZolam (XANAX) 0.5 MG tablet, Take 1 tab three times per day as needed for nerves MAX DAILY DOSE = 3 TABLETS, Disp: 270 tablet, Rfl: 1 .  Apple Cider Vinegar 500 MG TABS, Take 1,000 mg by mouth daily., Disp: , Rfl:  .  Ascorbic Acid (VITAMIN C) 1000 MG tablet, Take 1,000 mg by mouth daily., Disp: , Rfl:  .  baclofen (LIORESAL) 10 MG tablet, Take 10 mg by mouth. Taking 2 nightly, Disp: , Rfl:  .  cetirizine (ZYRTEC) 10 MG tablet, Take 10 mg by mouth daily.  , Disp: , Rfl:  .  Cholecalciferol (VITAMIN D-3) 5000 UNITS TABS, Take 1 tablet by mouth daily. , Disp: , Rfl:  .  colchicine (COLCRYS) 0.6 MG tablet, Take 1 tablet (0.6 mg total) by mouth daily. (Patient taking differently: Take 0.6 mg by mouth daily. As  needed), Disp: 30 tablet, Rfl: 11 .  cyanocobalamin 2000 MCG tablet, Take 2,500 mcg by mouth 2 (two) times daily., Disp: , Rfl:  .  fluticasone (FLONASE) 50 MCG/ACT nasal spray, Place 2 sprays into both nostrils daily., Disp: , Rfl:  .  furosemide (LASIX) 40 MG tablet, TAKE 1-2 TABLETS ONCE A DAY AS NEEDED FOR SWELLING, Disp: 60 tablet, Rfl: 11 .  glucosamine-chondroitin 500-400 MG tablet, Take 2 tablets by mouth 2 (two) times daily. , Disp: , Rfl:  .  guaiFENesin (MUCINEX) 600 MG 12 hr tablet, Take 1,600 mg by mouth 2 (two) times daily., Disp: , Rfl:  .  ipratropium-albuterol (DUONEB) 0.5-2.5 (3) MG/3ML SOLN, Take 3 mLs by nebulization 3 (three) times daily., Disp: 270 mL, Rfl: 7 .  Magnesium 400 MG CAPS, Take 3 capsules by mouth 2 (two) times daily. , Disp: , Rfl:  .  Misc Natural Products (TART CHERRY ADVANCED PO), Take by mouth., Disp: , Rfl:  .  montelukast (SINGULAIR) 10 MG tablet, Take 1 tablet (10 mg total) by mouth at bedtime., Disp: 30 tablet, Rfl: 6 .  naproxen sodium (ANAPROX) 220 MG tablet, Take 220 mg by mouth daily. As needed, Disp: , Rfl:  .  NON FORMULARY, Cinnamon 2 in the morning, Disp: , Rfl:  .  OXYGEN, Inhale 4 L into the lungs., Disp: , Rfl:  .  SPIRIVA HANDIHALER 18 MCG inhalation capsule, PLACE 1 CAPSULE INTO INHALER AND INHALE INTO LUNGS DAILY, Disp: 90 capsule, Rfl: 10 .  SYMBICORT 160-4.5 MCG/ACT inhaler, INHALE 2 PUFFS INTO THE LUNGS 2 TIMES DAILY, Disp: 1 Inhaler, Rfl: 5 .  traMADol (ULTRAM) 50 MG tablet, Take 1 tablet (50 mg total) by mouth 3 (three) times daily as needed for moderate pain., Disp: 90 tablet, Rfl: 3 .  TURMERIC PO, Take 400 mg by mouth 2 (two) times daily., Disp: , Rfl:  .  vitamin A 10000 UNIT capsule, Take 5,000 Units by mouth daily. , Disp: , Rfl:  .  Zinc 50 MG CAPS, Take by mouth daily., Disp: , Rfl:   Review of Systems:  Constitutional: Denies fever, chills, diaphoresis, appetite change and fatigue.  HEENT: Denies photophobia, eye pain,  redness, hearing loss, ear pain, congestion, sore throat, rhinorrhea, sneezing, mouth sores, trouble swallowing, neck pain, neck stiffness and tinnitus.  C/o nose and mouth dryness due to the oxygen she uses on a daily basis Respiratory: Denies chest tightness,  and wheezing.  C/o sob with excertion, but with improvement since last visit Cardiovascular: Denies chest  pain, palpitations and leg swelling.  Musculoskeletal: c/o hand and leg cramps periodically, denies back pain, joint swelling, arthralgias and gait problem.    Physical Exam: Vitals:   03/22/18 1407  BP: 110/70  Pulse: 91  Temp: 98.3 F (36.8 C)  TempSrc: Oral  SpO2: (!) 89%  Weight: 259 lb 1.6 oz (117.5 kg)    Body mass index is 43.12 kg/m.   Constitutional: NAD, calm, comfortable Eyes: PERRL, lids and conjunctivae normal ENMT: Mucous membranes are pale and dry. Posterior pharynx clear of any exudate or lesions. Normal dentition.  Respiratory: clear to auscultation bilaterally but very diminished, no wheezing, no crackles. Positive for labored breathing with excertion or long sentences. No accessory muscle use.  Cardiovascular: Regular rate and rhythm, no murmurs / rubs / gallops. No extremity edema. 2+ pedal pulses.   Musculoskeletal: no clubbing / cyanosis. No joint deformity upper and lower extremities. Good ROM, no contractures. Normal muscle tone.    Impression and Plan:  COPD mixed type ([place]) -advised pt to wear continous oxygen 3-4L  -pt will f/u with pulmonary on 04-04-2018 -will consider a OSA sleep study when pt returns or as per pulmonary recommendations -seems like advanced stage copd, @ Medicare Wellness visit we will consider discussing advanced care planning/ advance directives -currently on symbicort/spiriva and PRN albuterol. -has been using albuterol at least 6 times a day since her last visit 2 weeks ago  Morbid obesity ([place]) -DASH diet recommended, increased physical activity  Leg cramps    -Basic metabolic panel, Magnesium -unclear what relationship, if any, this has with her defecation habits?     Patient Instructions  -Nice seeing you today! Glad you are feeling better.  -Follow up with pulmonary as scheduled.  -Lab work today. We will notify you when results are available.  -Schedule follow up with me in 3 months.     Enzo Bi, RN DNP student [facility]

## 2018-03-22 NOTE — Patient Instructions (Signed)
-  Nice seeing you today! Glad you are feeling better.  -Follow up with pulmonary as scheduled.  -Lab work today. We will notify you when results are available.  -Schedule follow up with me in 3 months.

## 2018-04-04 ENCOUNTER — Ambulatory Visit (INDEPENDENT_AMBULATORY_CARE_PROVIDER_SITE_OTHER): Payer: Medicare HMO | Admitting: Pulmonary Disease

## 2018-04-04 ENCOUNTER — Encounter: Payer: Self-pay | Admitting: Pulmonary Disease

## 2018-04-04 VITALS — BP 128/72 | HR 111 | Ht 65.0 in | Wt 256.4 lb

## 2018-04-04 DIAGNOSIS — J449 Chronic obstructive pulmonary disease, unspecified: Secondary | ICD-10-CM

## 2018-04-04 DIAGNOSIS — J441 Chronic obstructive pulmonary disease with (acute) exacerbation: Secondary | ICD-10-CM | POA: Diagnosis not present

## 2018-04-04 DIAGNOSIS — M6080 Other myositis, unspecified site: Secondary | ICD-10-CM | POA: Diagnosis not present

## 2018-04-04 DIAGNOSIS — Z7189 Other specified counseling: Secondary | ICD-10-CM | POA: Diagnosis not present

## 2018-04-04 DIAGNOSIS — Z66 Do not resuscitate: Secondary | ICD-10-CM | POA: Diagnosis not present

## 2018-04-04 DIAGNOSIS — J9611 Chronic respiratory failure with hypoxia: Secondary | ICD-10-CM

## 2018-04-04 DIAGNOSIS — Z6841 Body Mass Index (BMI) 40.0 and over, adult: Secondary | ICD-10-CM | POA: Diagnosis not present

## 2018-04-04 DIAGNOSIS — R05 Cough: Secondary | ICD-10-CM | POA: Diagnosis not present

## 2018-04-04 MED ORDER — AEROCHAMBER MV MISC: 0 refills | Status: AC

## 2018-04-04 MED ORDER — TIOTROPIUM BROMIDE MONOHYDRATE 2.5 MCG/ACT IN AERS: 2.0000 | INHALATION_SPRAY | Freq: Every day | RESPIRATORY_TRACT | 0 refills | Status: DC

## 2018-04-04 NOTE — Progress Notes (Signed)
Synopsis: Referred in February 2020 for severe COPD by Isaac Bliss, [facility]*former patient of Dr. Lenna Gilford.   Subjective:   PATIENT ID: Rebecca [name] GENDER: female DOB: [birth year], MRN: [id number]  Chief Complaint  Patient presents with  . Follow-up    Former [facility] patient. States she had an exacerbation in december and has not been the same since. She was given a pred taper in december that helped for a short period of time.     This is a 68 year old female with a past medical history of COPD, fibromyalgia, morbid obesity, OSA, vitamin D deficiency, gastroesophageal reflux disease.  She is slightly histrionic in her storytelling.  She is a former smoker quit in 1993.  She has been managed on Symbicort and Spiriva HandiHaler for several years.  She is very adamant today about discussing benefit and utility regarding new endobronchial valve placement.  She has seen Dr. Johnnye Sima at [facility].  Review of prior CT imaging from 2009 revealed significant severe upper lobe centrilobular emphysema.  We reviewed these images today in the office.  She has had periodic chest x-rays throughout the years last chest x-ray in January 2019 with evidence of COPD still with chronic basilar interstitial changes as well as upper lobe emphysema.  No recent axial CT imaging of the chest.  Her last office spirometry was in 2016 at the time the spirometry revealed an FEV1 of 35% predicted.  She had a full pulmonary function test that was completed at [facility] this was done February 21, 2018 these results are available for viewing in care everywhere.  FEV1 was 35% predicted, RV was 203% predicted DLCO 43% predicted.  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  She did this for approximately 7 to 8 years.  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  Of note she uses CBD oil and cannabis extract.  This seems to be  on a daily regular basis.  She developed swelling of the right elbow and bursitis recently.  Her activities of daily living are relatively limited.  Should she spends most of the day and night sitting up at her computer.  With her arms rested on the table for positioning and tripoding that is why she is developed bursitis in the right elbow she states.  She does have a roommate that lives with her.  She has been told in the past that she should consider assisted living.      Past Medical History:  Diagnosis Date  . Chest pain   . Chronic venous insufficiency   . COPD (chronic obstructive pulmonary disease) ([place])   . DJD (degenerative joint disease)   . Dysthymia   . Fibromyalgia   . GERD (gastroesophageal reflux disease)   . History of headache   . Hodgkin lymphoma of extranodal or solid organ site [facility])   . Hypothyroid   . IBS (irritable bowel syndrome)   . Metabolic syndrome X   . Morbid obesity ([place])   . OSA (obstructive sleep apnea)   . Vitamin D deficiency      Family History  Problem Relation Age of Onset  . Heart failure Father   . Cancer Mother   . Parkinson's disease Mother   . Heart attack Sister      Past Surgical History:  Procedure Laterality Date  . APPENDECTOMY    . BILATERAL SALPINGOOPHORECTOMY  2007   [facility]  .  CHOLECYSTECTOMY  1982  . LAPAROSCOPIC GASTRIC BANDING  08/2007   Dr. Hassell Done  . LAPAROSCOPIC HYSTERECTOMY  2007   [facility]  . 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 Marital status: Single    Spouse name: Not on file  . Number of children: 1  . Years of education: Not on file  . Highest education level: Not on file  Occupational History  . Not on file  Social Needs  . Financial resource strain: Not on file  . Food insecurity:    Worry: Not on file    Inability: Not on file  . Transportation needs:    Medical: Not on file    Non-medical: Not on file  Tobacco Use  . Smoking status:  Former Smoker    Packs/day: 1.00    Types: Cigarettes    Last attempt to quit: 03/03/1991    Years since quitting: 27.1  . Smokeless tobacco: Never Used  Substance and Sexual Activity  . Alcohol use: No    Alcohol/week: 0.0 standard drinks  . Drug use: No  . Sexual activity: Not on file  Lifestyle  . Physical activity:    Days per week: Not on file    Minutes per session: Not on file  . Stress: Not on file  Relationships  . Social connections:    Talks on phone: Not on file    Gets together: Not on file    Attends religious service: Not on file    Active member of club or organization: Not on file    Attends meetings of clubs or organizations: Not on file    Relationship status: Not on file  . Intimate partner violence:    Fear of current or ex partner: Not on file    Emotionally abused: Not on file    Physically abused: Not on file    Forced sexual activity: Not on file  Other Topics Concern  . Not on file  Social History Narrative   Does not exercise   3 cups of coffee a day   Single- one child whom she gave up for adoption at birth   And re-established contact in 2009 w/ new relationship and 2 grand daughters.   She lives with roommate.       Allergies  Allergen Reactions  . Penicillins Nausea Only and Nausea And Vomiting    REACTION: nausea and vomiting REACTION: nausea and vomiting REACTION: nausea and vomiting     Outpatient Medications Prior to Visit  Medication Sig Dispense Refill  . albuterol (VENTOLIN HFA) 108 (90 Base) MCG/ACT inhaler INHALE 2 PUFFS INTO THE LUNGS EVERY 6 HOURS AS NEEDED FOR WHEEZING OR SHORTNESS OF BREATH (Patient taking differently: 2 puffs. 2 puffs in the morning and 2 puffs at bedtime) 54 each 4  . ALPRAZolam (XANAX) 0.5 MG tablet Take 1 tab three times per day as needed for nerves MAX DAILY DOSE = 3 TABLETS 270 tablet 1  . Apple Cider Vinegar 500 MG TABS Take 1,000 mg by mouth daily.    . Ascorbic Acid (VITAMIN C) 1000 MG tablet Take  1,000 mg by mouth daily.    . baclofen (LIORESAL) 10 MG tablet Take 10 mg by mouth. Taking 2 nightly    . cetirizine (ZYRTEC) 10 MG tablet Take 10 mg by mouth daily.      . Cholecalciferol (VITAMIN D-3) 5000 UNITS TABS Take 1 tablet by mouth daily.     Marland Kitchen  colchicine (COLCRYS) 0.6 MG tablet Take 1 tablet (0.6 mg total) by mouth daily. (Patient taking differently: Take 0.6 mg by mouth daily. As needed) 30 tablet 11  . cyanocobalamin 2000 MCG tablet Take 2,500 mcg by mouth 2 (two) times daily.    . fluticasone (FLONASE) 50 MCG/ACT nasal spray Place 2 sprays into both nostrils daily.    . furosemide (LASIX) 40 MG tablet TAKE 1-2 TABLETS ONCE A DAY AS NEEDED FOR SWELLING 60 tablet 11  . glucosamine-chondroitin 500-400 MG tablet Take 2 tablets by mouth 2 (two) times daily.     Marland Kitchen guaiFENesin (MUCINEX) 600 MG 12 hr tablet Take 1,600 mg by mouth 2 (two) times daily.    Marland Kitchen ipratropium-albuterol (DUONEB) 0.5-2.5 (3) MG/3ML SOLN Take 3 mLs by nebulization 3 (three) times daily. 270 mL 7  . Magnesium 400 MG CAPS Take 3 capsules by mouth 2 (two) times daily.     . Misc Natural Products (TART CHERRY ADVANCED PO) Take by mouth.    . montelukast (SINGULAIR) 10 MG tablet Take 1 tablet (10 mg total) by mouth at bedtime. 30 tablet 6  . naproxen sodium (ANAPROX) 220 MG tablet Take 220 mg by mouth daily. As needed    . NON FORMULARY Cinnamon 2 in the morning    . OXYGEN Inhale 4 L into the lungs.    . SPIRIVA HANDIHALER 18 MCG inhalation capsule PLACE 1 CAPSULE INTO INHALER AND INHALE INTO LUNGS DAILY 90 capsule 10  . SYMBICORT 160-4.5 MCG/ACT inhaler INHALE 2 PUFFS INTO THE LUNGS 2 TIMES DAILY 1 Inhaler 5  . traMADol (ULTRAM) 50 MG tablet Take 1 tablet (50 mg total) by mouth 3 (three) times daily as needed for moderate pain. 90 tablet 3  . TURMERIC PO Take 400 mg by mouth 2 (two) times daily.    . vitamin A 10000 UNIT capsule Take 5,000 Units by mouth daily.     . Zinc 50 MG CAPS Take by mouth daily.     No  facility-administered medications prior to visit.     Review of Systems  Constitutional: Positive for malaise/fatigue. Negative for chills, fever and weight loss.  HENT: Negative for hearing loss, sore throat and tinnitus.   Eyes: Negative for blurred vision and double vision.  Respiratory: Positive for cough, sputum production, shortness of breath and wheezing. Negative for hemoptysis and stridor.   Cardiovascular: Positive for palpitations and PND. Negative for chest pain, orthopnea and leg swelling.  Gastrointestinal: Positive for heartburn. Negative for abdominal pain, constipation, diarrhea, nausea and vomiting.  Genitourinary: Negative for dysuria, hematuria and urgency.  Musculoskeletal: Positive for joint pain and myalgias.  Skin: Negative for itching and rash.  Neurological: Negative for dizziness, tingling, weakness and headaches.  Endo/Heme/Allergies: Negative for environmental allergies. Does not bruise/bleed easily.  Psychiatric/Behavioral: Positive for depression. The patient is nervous/anxious. The patient does not have insomnia.   All other systems reviewed and are negative.    Objective:  Physical Exam Vitals signs reviewed.  Constitutional:      General: She is not in acute distress.    Appearance: She is well-developed. She is obese.  HENT:     Head: Normocephalic and atraumatic.     Mouth/Throat:     Pharynx: No oropharyngeal exudate.  Eyes:     Conjunctiva/sclera: Conjunctivae normal.     Pupils: Pupils are equal, round, and reactive to light.  Neck:     Vascular: No JVD.     Trachea: No tracheal deviation.  Cardiovascular:  Rate and Rhythm: Normal rate and regular rhythm.     Heart sounds: S1 normal and S2 normal.     Comments: Distant heart tones Pulmonary:     Effort: No tachypnea or accessory muscle usage.     Breath sounds: No stridor. Decreased breath sounds (throughout all lung fields) and wheezing present. No rhonchi or rales.  Abdominal:      General: Bowel sounds are normal. There is no distension.     Palpations: Abdomen is soft.     Tenderness: There is no abdominal tenderness.  Skin:    General: Skin is warm and dry.     Capillary Refill: Capillary refill takes less than 2 seconds.     Findings: No rash.  Neurological:     Mental Status: She is alert and oriented to person, place, and time.  Psychiatric:        Behavior: Behavior normal.      Vitals:   04/04/18 1547  BP: 128/72  Pulse: (!) 111  SpO2: 94%  Weight: 256 lb 6.4 oz (116.3 kg)  Height: 5\' 5"  (1.651 m)   94% on 4 L BMI Readings from Last 3 Encounters:  04/04/18 42.67 kg/m  03/22/18 43.12 kg/m  03/08/18 43.55 kg/m   Wt Readings from Last 3 Encounters:  04/04/18 256 lb 6.4 oz (116.3 kg)  03/22/18 259 lb 1.6 oz (117.5 kg)  03/08/18 261 lb 11.2 oz (118.7 kg)     CBC    Component Value Date/Time   WBC 9.4 03/16/2017 1241   RBC 4.99 03/16/2017 1241   HGB 15.3 (H) 03/16/2017 1241   HCT 46.4 (H) 03/16/2017 1241   PLT 350.0 03/16/2017 1241   MCV 93.0 03/16/2017 1241   MCHC 32.9 03/16/2017 1241   RDW 14.4 03/16/2017 1241   LYMPHSABS 2.2 03/16/2017 1241   MONOABS 0.8 03/16/2017 1241   EOSABS 0.3 03/16/2017 1241   BASOSABS 0.1 03/16/2017 1241    Chest Imaging: 03/16/2017: Chest x-ray reviewed with evidence of COPD changes. The patient's images have been independently reviewed by me.    Pulmonary Functions Testing Results: No flowsheet data found.  FeNO: None   Pathology: None   Echocardiogram: None   Heart Catheterization: None     Assessment & Plan:   COPD mixed type ([place]) - Plan: AMB referral to pulmonary rehabilitation  Stage 3 severe COPD by GOLD classification ([place])  Class 3 severe obesity due to excess calories with serious comorbidity and body mass index (BMI) of 40.0 to 44.9 in adult [facility])  Chronic hypoxemic respiratory failure ([place])  DNR (do not resuscitate)  DNR (do not resuscitate)  discussion  Discussion:  This is a 68 year old female with severe COPD, FEV1 35% predicted, chronic hypoxemic respiratory failure.  She is morbidly obese which I think is a significant hander on her activities of daily living along with her functional ability.  She also seems to self medicate with some of her inhaler regimens.  States that she does use them regularly but when asked in detail throughout the years she tends to change her mind about usage.  I recommended that she switch from Spiriva HandiHaler to Spiriva Respimat.  She states that she likes to use inhalers versus nebulizers.  She seem to hent that going to use of nebulizer would be kind of a failure that she would be getting worse.  I do think that her next best option for drug delivery medications for her and particularly would be a nebulizer I like to  potentially switch all of her medications to nebs and steroids, long-acting muscarinic as well as long-acting beta agonist via nebulizer.  We will recommend addition of a spacer with her Symbicort.  She is very skeptical about this because she thinks most of the medication ends up within a spacer.  I did however inform her that the drug was actually designed to be used with a spacer.  We also had a long discussion about goals of care and life.  She would never want to be intubated or placed on life support therefore I encouraged her to sign a durable DNR form and consider having a medical alert bracelet for this.  Today in the office we signed a [place] durable DNR.Marland Kitchen  Additionally I think she would benefit from returning to pulmonary rehab if possible.  Weight loss is can be very important for her for improving her outcomes.  Greater than 50% of this patient's 40-minute office was spent face-to-face discussing above recommendations treatment and treatment plan.   Current Outpatient Medications:  .  albuterol (VENTOLIN HFA) 108 (90 Base) MCG/ACT inhaler, INHALE 2 PUFFS INTO THE  LUNGS EVERY 6 HOURS AS NEEDED FOR WHEEZING OR SHORTNESS OF BREATH (Patient taking differently: 2 puffs. 2 puffs in the morning and 2 puffs at bedtime), Disp: 54 each, Rfl: 4 .  ALPRAZolam (XANAX) 0.5 MG tablet, Take 1 tab three times per day as needed for nerves MAX DAILY DOSE = 3 TABLETS, Disp: 270 tablet, Rfl: 1 .  Apple Cider Vinegar 500 MG TABS, Take 1,000 mg by mouth daily., Disp: , Rfl:  .  Ascorbic Acid (VITAMIN C) 1000 MG tablet, Take 1,000 mg by mouth daily., Disp: , Rfl:  .  baclofen (LIORESAL) 10 MG tablet, Take 10 mg by mouth. Taking 2 nightly, Disp: , Rfl:  .  cetirizine (ZYRTEC) 10 MG tablet, Take 10 mg by mouth daily.  , Disp: , Rfl:  .  Cholecalciferol (VITAMIN D-3) 5000 UNITS TABS, Take 1 tablet by mouth daily. , Disp: , Rfl:  .  colchicine (COLCRYS) 0.6 MG tablet, Take 1 tablet (0.6 mg total) by mouth daily. (Patient taking differently: Take 0.6 mg by mouth daily. As needed), Disp: 30 tablet, Rfl: 11 .  cyanocobalamin 2000 MCG tablet, Take 2,500 mcg by mouth 2 (two) times daily., Disp: , Rfl:  .  fluticasone (FLONASE) 50 MCG/ACT nasal spray, Place 2 sprays into both nostrils daily., Disp: , Rfl:  .  furosemide (LASIX) 40 MG tablet, TAKE 1-2 TABLETS ONCE A DAY AS NEEDED FOR SWELLING, Disp: 60 tablet, Rfl: 11 .  glucosamine-chondroitin 500-400 MG tablet, Take 2 tablets by mouth 2 (two) times daily. , Disp: , Rfl:  .  guaiFENesin (MUCINEX) 600 MG 12 hr tablet, Take 1,600 mg by mouth 2 (two) times daily., Disp: , Rfl:  .  ipratropium-albuterol (DUONEB) 0.5-2.5 (3) MG/3ML SOLN, Take 3 mLs by nebulization 3 (three) times daily., Disp: 270 mL, Rfl: 7 .  Magnesium 400 MG CAPS, Take 3 capsules by mouth 2 (two) times daily. , Disp: , Rfl:  .  Misc Natural Products (TART CHERRY ADVANCED PO), Take by mouth., Disp: , Rfl:  .  montelukast (SINGULAIR) 10 MG tablet, Take 1 tablet (10 mg total) by mouth at bedtime., Disp: 30 tablet, Rfl: 6 .  naproxen sodium (ANAPROX) 220 MG tablet, Take 220 mg by  mouth daily. As needed, Disp: , Rfl:  .  NON FORMULARY, Cinnamon 2 in the morning, Disp: , Rfl:  .  OXYGEN,  Inhale 4 L into the lungs., Disp: , Rfl:  .  SPIRIVA HANDIHALER 18 MCG inhalation capsule, PLACE 1 CAPSULE INTO INHALER AND INHALE INTO LUNGS DAILY, Disp: 90 capsule, Rfl: 10 .  SYMBICORT 160-4.5 MCG/ACT inhaler, INHALE 2 PUFFS INTO THE LUNGS 2 TIMES DAILY, Disp: 1 Inhaler, Rfl: 5 .  traMADol (ULTRAM) 50 MG tablet, Take 1 tablet (50 mg total) by mouth 3 (three) times daily as needed for moderate pain., Disp: 90 tablet, Rfl: 3 .  TURMERIC PO, Take 400 mg by mouth 2 (two) times daily., Disp: , Rfl:  .  vitamin A 10000 UNIT capsule, Take 5,000 Units by mouth daily. , Disp: , Rfl:  .  Zinc 50 MG CAPS, Take by mouth daily., Disp: , Rfl:    Garner Nash, DO [facility] 04/04/2018 4:26 PM

## 2018-04-04 NOTE — Patient Instructions (Addendum)
Thank you for visiting Dr. Valeta Harms at [facility]. Today we recommend the following: Orders Placed This Encounter  Procedures  . AMB referral to pulmonary rehabilitation   Meds ordered this encounter  Medications  . Spacer/Aero-Holding Chambers (AEROCHAMBER MV) inhaler    Sig: Use as instructed    Dispense:  1 each    Refill:  0  . Tiotropium Bromide Monohydrate (SPIRIVA RESPIMAT) 2.5 MCG/ACT AERS    Sig: Inhale 2 puffs into the lungs daily.    Dispense:  1 Inhaler    Refill:  0   We are starting you on a new Spiriva 2 puffs daily.   Return in about 4 months (around 08/03/2018).

## 2018-04-05 ENCOUNTER — Telehealth: Payer: Self-pay | Admitting: Pulmonary Disease

## 2018-04-05 DIAGNOSIS — Z66 Do not resuscitate: Secondary | ICD-10-CM | POA: Insufficient documentation

## 2018-04-05 NOTE — Telephone Encounter (Signed)
lmtcb x1 to pt.

## 2018-04-05 NOTE — Telephone Encounter (Signed)
Spoke with pt.  She expressed feeling that all of her symbicort got stuck in aerochamber and she did not receive any in her lungs.  Explained to her that it aeorsolized the medication making it easier to inhale into the lungs.  She complained of not feeling good, last night had a fever of 101 with chills and body aches.  Cough with clear mucus and nasal congestion that she claims is normal for her.  She is experiencing increased SOB over the last few months. She is using flonase and mucinex daily with little relief.  Offered her an appointment which she said she would think about or see her PCP.  Pt had OV 04/04/18 with Dr. Valeta Harms.  Routing to Dr. Valeta Harms for recommendations.

## 2018-04-05 NOTE — Telephone Encounter (Signed)
PCCM: Agree with your recommendations and what you offered her.  If she doesn't want the symbicort then she should be transitioned to nebs that I offered in clinic but she didn't want to do that.   Garner Nash, DO [facility] 04/05/2018 12:15 PM

## 2018-04-06 NOTE — Telephone Encounter (Signed)
LMTCB x2 for pt 

## 2018-04-07 ENCOUNTER — Ambulatory Visit (INDEPENDENT_AMBULATORY_CARE_PROVIDER_SITE_OTHER): Payer: Medicare HMO | Admitting: Internal Medicine

## 2018-04-07 ENCOUNTER — Encounter: Payer: Self-pay | Admitting: Internal Medicine

## 2018-04-07 VITALS — BP 110/60 | HR 105 | Temp 98.9°F | Wt 250.4 lb

## 2018-04-07 DIAGNOSIS — M71121 Other infective bursitis, right elbow: Secondary | ICD-10-CM

## 2018-04-07 LAB — CBC WITH DIFFERENTIAL/PLATELET
BASOS ABS: 0.1 10*3/uL (ref 0.0–0.1)
Basophils Relative: 0.4 % (ref 0.0–3.0)
Eosinophils Absolute: 0.1 10*3/uL (ref 0.0–0.7)
Eosinophils Relative: 0.5 % (ref 0.0–5.0)
HCT: 43.3 % (ref 36.0–46.0)
HEMOGLOBIN: 14.3 g/dL (ref 12.0–15.0)
Lymphocytes Relative: 13.7 % (ref 12.0–46.0)
Lymphs Abs: 1.8 10*3/uL (ref 0.7–4.0)
MCHC: 33 g/dL (ref 30.0–36.0)
MCV: 89.5 fl (ref 78.0–100.0)
Monocytes Absolute: 1.1 10*3/uL — ABNORMAL HIGH (ref 0.1–1.0)
Monocytes Relative: 8.6 % (ref 3.0–12.0)
Neutro Abs: 10.1 10*3/uL — ABNORMAL HIGH (ref 1.4–7.7)
Neutrophils Relative %: 76.8 % (ref 43.0–77.0)
Platelets: 363 10*3/uL (ref 150.0–400.0)
RBC: 4.84 Mil/uL (ref 3.87–5.11)
RDW: 15 % (ref 11.5–15.5)
WBC: 13.2 10*3/uL — ABNORMAL HIGH (ref 4.0–10.5)

## 2018-04-07 MED ORDER — CEPHALEXIN 500 MG PO CAPS: 500.0000 mg | ORAL_CAPSULE | Freq: Two times a day (BID) | ORAL | 0 refills | Status: AC

## 2018-04-07 MED ORDER — CEFTRIAXONE SODIUM 1 G IJ SOLR
1.0000 g | Freq: Once | INTRAMUSCULAR | Status: AC
  Administered 2018-04-07: 1 g via INTRAMUSCULAR

## 2018-04-07 NOTE — Telephone Encounter (Signed)
LMTCB x3 for pt.  

## 2018-04-07 NOTE — Progress Notes (Signed)
Established Patient Office Visit     CC/Reason for Visit: Pain and swelling of right elbow  HPI: Rebecca [name] is a 68 y.o. female who is coming in today for the above mentioned reasons.  She states that for the past 2 weeks has been noticing mild pain and swelling of her right elbow.  This is the arm that she uses to do her Internet surfing so she tells me that that right elbow is constantly on a hard surface.  Over the past 4 days she has noticed progressive swelling of the right elbow in addition to significant redness that is spreading both proximally and distally from that site.  She had a fever of 101 yesterday and has been mildly nauseous prompting her visit today.   Past Medical/Surgical History: Past Medical History:  Diagnosis Date  . Chest pain   . Chronic venous insufficiency   . COPD (chronic obstructive pulmonary disease) ([place])   . DJD (degenerative joint disease)   . Dysthymia   . Fibromyalgia   . GERD (gastroesophageal reflux disease)   . History of headache   . Hodgkin lymphoma of extranodal or solid organ site [facility])   . Hypothyroid   . IBS (irritable bowel syndrome)   . Metabolic syndrome X   . Morbid obesity ([place])   . OSA (obstructive sleep apnea)   . Vitamin D deficiency     Past Surgical History:  Procedure Laterality Date  . APPENDECTOMY    . BILATERAL SALPINGOOPHORECTOMY  2007   [place]  . CHOLECYSTECTOMY  1982  . LAPAROSCOPIC GASTRIC BANDING  08/2007   Dr. Hassell Done  . LAPAROSCOPIC HYSTERECTOMY  2007   [facility]  . 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 Her smoking use included cigarettes. She smoked 1.00 pack per day. She has never used smokeless tobacco. She reports that she does not drink alcohol or use drugs.  Allergies: Allergies  Allergen Reactions  . Penicillins Nausea Only and Nausea And Vomiting    REACTION: nausea and vomiting REACTION: nausea and  vomiting REACTION: nausea and vomiting    Family History:  Family History  Problem Relation Age of Onset  . Heart failure Father   . Cancer Mother   . Parkinson's disease Mother   . Heart attack Sister      Current Outpatient Medications:  .  albuterol (VENTOLIN HFA) 108 (90 Base) MCG/ACT inhaler, INHALE 2 PUFFS INTO THE LUNGS EVERY 6 HOURS AS NEEDED FOR WHEEZING OR SHORTNESS OF BREATH (Patient taking differently: 2 puffs. 2 puffs in the morning and 2 puffs at bedtime), Disp: 54 each, Rfl: 4 .  ALPRAZolam (XANAX) 0.5 MG tablet, Take 1 tab three times per day as needed for nerves MAX DAILY DOSE = 3 TABLETS (Patient taking differently: 0.5 mg at bedtime as needed. ), Disp: 270 tablet, Rfl: 1 .  Apple Cider Vinegar 500 MG TABS, Take 1,000 mg by mouth daily., Disp: , Rfl:  .  Ascorbic Acid (VITAMIN C) 1000 MG tablet, Take 1,000 mg by mouth daily., Disp: , Rfl:  .  baclofen (LIORESAL) 10 MG tablet, Take 10 mg by mouth. Taking 1 nightly, Disp: , Rfl:  .  cetirizine (ZYRTEC) 10 MG tablet, Take 10 mg by mouth daily.  , Disp: , Rfl:  .  Cholecalciferol (VITAMIN D-3) 5000 UNITS TABS, Take 1 tablet by mouth daily. , Disp: , Rfl:  .  colchicine (COLCRYS) 0.6 MG tablet, Take 1 tablet (0.6 mg total) by mouth daily. (Patient taking differently: Take 0.6 mg by mouth daily as needed. ), Disp: 30 tablet, Rfl: 11 .  cyanocobalamin 2000 MCG tablet, Take 2,500 mcg by mouth 2 (two) times daily., Disp: , Rfl:  .  fluticasone (FLONASE) 50 MCG/ACT nasal spray, Place 2 sprays into both nostrils daily as needed. , Disp: , Rfl:  .  furosemide (LASIX) 40 MG tablet, TAKE 1-2 TABLETS ONCE A DAY AS NEEDED FOR SWELLING (Patient taking differently: 80 mg. TAKE 1 tab daily), Disp: 60 tablet, Rfl: 11 .  glucosamine-chondroitin 500-400 MG tablet, Take 2 tablets by mouth 2 (two) times daily. , Disp: , Rfl:  .  guaiFENesin (MUCINEX) 600 MG 12 hr tablet, Take 1,600 mg by mouth 2 (two) times daily., Disp: , Rfl:  .   ipratropium-albuterol (DUONEB) 0.5-2.5 (3) MG/3ML SOLN, Take 3 mLs by nebulization 3 (three) times daily. (Patient taking differently: Take 3 mLs by nebulization 2 (two) times daily. ), Disp: 270 mL, Rfl: 7 .  Magnesium 400 MG CAPS, Take 3 capsules by mouth 2 (two) times daily. , Disp: , Rfl:  .  Misc Natural Products (TART CHERRY ADVANCED PO), Take by mouth., Disp: , Rfl:  .  montelukast (SINGULAIR) 10 MG tablet, Take 1 tablet (10 mg total) by mouth at bedtime., Disp: 30 tablet, Rfl: 6 .  NON FORMULARY, Cinnamon 2 in the morning, Disp: , Rfl:  .  OXYGEN, Inhale 4 L into the lungs., Disp: , Rfl:  .  Spacer/Aero-Holding Chambers (AEROCHAMBER MV) inhaler, Use as instructed, Disp: 1 each, Rfl: 0 .  SYMBICORT 160-4.5 MCG/ACT inhaler, INHALE 2 PUFFS INTO THE LUNGS 2 TIMES DAILY, Disp: 1 Inhaler, Rfl: 5 .  Tiotropium Bromide Monohydrate (SPIRIVA RESPIMAT) 2.5 MCG/ACT AERS, Inhale 2 puffs into the lungs daily., Disp: 1 Inhaler, Rfl: 0 .  traMADol (ULTRAM) 50 MG tablet, Take 1 tablet (50 mg total) by mouth 3 (three) times daily as needed for moderate pain., Disp: 90 tablet, Rfl: 3 .  TURMERIC PO, Take 400 mg by mouth 2 (two) times daily., Disp: , Rfl:  .  vitamin A 10000 UNIT capsule, Take 5,000 Units by mouth daily. , Disp: , Rfl:   Review of Systems:  Constitutional: Positive for fever, chills, diaphoresis, appetite change and fatigue.  HEENT: Denies photophobia, eye pain, redness, hearing loss, ear pain, congestion, sore throat, rhinorrhea, sneezing, mouth sores, trouble swallowing, neck pain, neck stiffness and tinnitus.   Respiratory: Denies SOB, DOE, cough, chest tightness,  and wheezing.   Cardiovascular: Denies chest pain, palpitations and leg swelling.  Gastrointestinal: Denies vomiting, abdominal pain, diarrhea, constipation, blood in stool and abdominal distention. Positive for nausea Genitourinary: Denies dysuria, urgency, frequency, hematuria, flank pain and difficulty urinating.  Endocrine:  Denies: hot or cold intolerance, sweats, changes in hair or nails, polyuria, polydipsia. Musculoskeletal: Denies myalgias, back pain, joint swelling, arthralgias and gait problem.  Skin: Denies pallor, rash and wound.  Neurological: Denies dizziness, seizures, syncope, weakness, light-headedness, numbness and headaches.  Hematological: Denies adenopathy. Easy bruising, personal or family bleeding history  Psychiatric/Behavioral: Denies suicidal ideation, mood changes, confusion, nervousness, sleep disturbance and agitation    Physical Exam: Vitals:   04/07/18 1439  BP: 110/60  Pulse: (!) 105  Temp: 98.9 F (37.2 C)  TempSrc: Oral  SpO2: 96%  Weight: 250 lb 6.4 oz (113.6 kg)    Body mass index is 41.67 kg/m.   Constitutional: NAD, calm, comfortable Eyes: PERRL,  lids and conjunctivae normal ENMT: Mucous membranes are dry.  Musculoskeletal: no clubbing / cyanosis. No joint deformity  lower extremities. Good ROM, no contractures. Normal muscle tone.   Right Elbow see pics:         Psychiatric: Normal judgment and insight. Alert and oriented x 3. Normal mood.    Impression and Plan:  Septic olecranon bursitis of right elbow -Definitely appears infected on exam today. -Will check CBC. -She will be given IM Rocephin in clinic. -In addition we will send home with an 8-day course of Keflex. -She has been advised to ice the elbow. -She will return to see me within 2 weeks if no significant improvement.    Patient Instructions  -Hope you feel better!  -You will receive a shot of rocephin (antibiotic) today.  -Start keflex 500 mg twice daily for 8 days.  -Schedule follow up for 2 weeks if not improved.   Elbow Bursitis Bursitis is swelling and pain at the tip of your elbow. This happens when fluid builds up in a sac under your skin (bursa). This may also be called olecranon bursitis. Follow these instructions at home: Medicines  Take over-the-counter and  prescription medicines only as told by your doctor.  If you were prescribed an antibiotic, take it exactly as told by your doctor. Do not stop taking it even if you start to feel better. Managing pain, stiffness, and swelling   If told, put ice on your elbow: ? Put ice in a plastic bag. ? Place a towel between your skin and the bag. ? Leave the ice on for 20 minutes, 2-3 times a day.  If your bursitis is caused by an injury, follow instructions from your doctor about: ? Resting your elbow. ? Wearing a bandage.  Wear elbow pads or elbow wraps as needed. These help cushion your elbow. General instructions  Avoid any activities that cause elbow pain. Ask your doctor what activities are safe for you.  Keep all follow-up visits as told by your doctor. This is important. Contact a doctor if you have:  A fever.  Problems that do not get better with treatment.  Pain or swelling that: ? Gets worse. ? Goes away and then comes back.  Pus draining from your elbow. Get help right away if you have:  Trouble moving your arm, hand, or fingers. Summary  Bursitis is swelling and pain at the tip of the elbow.  You may need to take medicine or put ice on your elbow.  Contact your doctor if your problems do not get better with treatment. This information is not intended to replace advice given to you by your health care provider. Make sure you discuss any questions you have with your health care provider. Document Released: 08/06/2009 Document Revised: 01/26/2017 Document Reviewed: 01/26/2017 Elsevier Interactive Patient Education  2019 [place], MD [facility]

## 2018-04-07 NOTE — Patient Instructions (Addendum)
-  Hope you feel better!  -You will receive a shot of rocephin (antibiotic) today.  -Start keflex 500 mg twice daily for 8 days.  -Schedule follow up for 2 weeks if not improved.   Elbow Bursitis Bursitis is swelling and pain at the tip of your elbow. This happens when fluid builds up in a sac under your skin (bursa). This may also be called olecranon bursitis. Follow these instructions at home: Medicines  Take over-the-counter and prescription medicines only as told by your doctor.  If you were prescribed an antibiotic, take it exactly as told by your doctor. Do not stop taking it even if you start to feel better. Managing pain, stiffness, and swelling   If told, put ice on your elbow: ? Put ice in a plastic bag. ? Place a towel between your skin and the bag. ? Leave the ice on for 20 minutes, 2-3 times a day.  If your bursitis is caused by an injury, follow instructions from your doctor about: ? Resting your elbow. ? Wearing a bandage.  Wear elbow pads or elbow wraps as needed. These help cushion your elbow. General instructions  Avoid any activities that cause elbow pain. Ask your doctor what activities are safe for you.  Keep all follow-up visits as told by your doctor. This is important. Contact a doctor if you have:  A fever.  Problems that do not get better with treatment.  Pain or swelling that: ? Gets worse. ? Goes away and then comes back.  Pus draining from your elbow. Get help right away if you have:  Trouble moving your arm, hand, or fingers. Summary  Bursitis is swelling and pain at the tip of the elbow.  You may need to take medicine or put ice on your elbow.  Contact your doctor if your problems do not get better with treatment. This information is not intended to replace advice given to you by your health care provider. Make sure you discuss any questions you have with your health care provider. Document Released: 08/06/2009 Document Revised:  01/26/2017 Document Reviewed: 01/26/2017 Elsevier Interactive Patient Education  2019 [employer].

## 2018-04-08 ENCOUNTER — Ambulatory Visit: Payer: Self-pay

## 2018-04-08 NOTE — Telephone Encounter (Signed)
No need for gout medication. Can we call her and ask how she is doing if you have time this afternoon?

## 2018-04-08 NOTE — Telephone Encounter (Signed)
Spoke with patient and she states her arm "is doing a little better and she feels good".

## 2018-04-08 NOTE — Telephone Encounter (Signed)
We have attempted to contact pt several times with no success or call back from pt. Per triage protocol, message will be closed.  

## 2018-04-08 NOTE — Telephone Encounter (Signed)
Pt is wanting to know if she should be taking her Gout medication for the Bursitis. Is it a form of Gout. She takes Colcrys only for flare ups. Spoke with office FC Sheena.  Will route question to office for answer.Pt is aware.  Reason for Disposition . [1] Follow-up call to recent contact AND [2] information only call, no triage required  Answer Assessment - Initial Assessment Questions 1. REASON FOR CALL or QUESTION: "What is your reason for calling today?" or "How can I best help you?" or "What question do you have that I can I help answer?   Pt is wanting to know if she should be taking her Gout medication for the Bursitis. Is it a form of Gout. She takes Colcrys only for flare ups  Protocols used: INFORMATION ONLY CALL-A-AH

## 2018-04-11 ENCOUNTER — Other Ambulatory Visit: Payer: Self-pay | Admitting: Pulmonary Disease

## 2018-04-11 ENCOUNTER — Other Ambulatory Visit: Payer: Self-pay | Admitting: Internal Medicine

## 2018-04-11 NOTE — Telephone Encounter (Signed)
Requested medication (s) are due for refill today -expired Rx  Requested medication (s) are on the active medication list -yes  Future visit scheduled 11/18/16 original written  Last refill: 1 year ago  Notes to clinic: Patient is requesting refill of expired Rx from outside provider. Patient has appointment 06/28/18-sent for PCP review  Requested Prescriptions  Pending Prescriptions Disp Refills   colchicine (COLCRYS) 0.6 MG tablet 30 tablet 11    Sig: Take 1 tablet (0.6 mg total) by mouth daily.     Endocrinology:  Gout Agents Failed - 04/11/2018 10:55 AM      Failed - Uric Acid in normal range and within 360 days    Uric Acid, Serum  Date Value Ref Range Status  07/16/2014 6.6 2.4 - 7.0 mg/dL Final         Failed - Cr in normal range and within 360 days    Creat  Date Value Ref Range Status  03/16/2017 1.17 (H) 0.50 - 0.99 mg/dL Final    Comment:    For patients >56 years of age, the reference limit for Creatinine is approximately 13% higher for people identified as African-American. .    Creatinine, Ser  Date Value Ref Range Status  03/22/2018 1.39 (H) 0.40 - 1.20 mg/dL Final         Passed - Valid encounter within last 12 months    Recent Outpatient Visits          4 days ago Septic olecranon bursitis of right elbow   [place] [facility] [place], [place]   2 weeks ago COPD mixed type [facility])   [place] at [facility], Rayford Halsted, MD   1 month ago COPD with acute exacerbation [facility])   [place] at [facility], Rayford Halsted, MD   2 months ago COPD mixed type [facility])   [place] [facility] [place], [place]   7 months ago Primary osteoarthritis of both [place] [facility], [place], DO      Future Appointments            In 2 months Isaac Bliss, Rayford Halsted, MD [place] at [place], [facility]   In 4 months [facility]card, Octavio Graves, DO [place]  Pulmonary Care            Requested Prescriptions  Pending Prescriptions Disp Refills   colchicine (COLCRYS) 0.6 MG tablet 30 tablet 11    Sig: Take 1 tablet (0.6 mg total) by mouth daily.     Endocrinology:  Gout Agents Failed - 04/11/2018 10:55 AM      Failed - Uric Acid in normal range and within 360 days    Uric Acid, Serum  Date Value Ref Range Status  07/16/2014 6.6 2.4 - 7.0 mg/dL Final         Failed - Cr in normal range and within 360 days    Creat  Date Value Ref Range Status  03/16/2017 1.17 (H) 0.50 - 0.99 mg/dL Final    Comment:    For patients >36 years of age, the reference limit for Creatinine is approximately 13% higher for people identified as African-American. .    Creatinine, Ser  Date Value Ref Range Status  03/22/2018 1.39 (H) 0.40 - 1.20 mg/dL Final         Passed - Valid encounter within last 12 months    Recent Outpatient Visits  4 days ago Septic olecranon bursitis of right elbow   Therapist, [place] at [place], MD   2 weeks ago COPD mixed type [facility])   [place] at [facility], Rayford Halsted, MD   1 month ago COPD with acute exacerbation [facility])   [place] at [facility], Rayford Halsted, MD   2 months ago COPD mixed type [facility])   [place] at [employer], Langley Adie, MD   7 months ago Primary osteoarthritis of both [place], [place], DO      Future Appointments            In 2 months Isaac Bliss, Rayford Halsted, MD [place] at [place], [place]   In 4 months Icard, Octavio Graves, DO [facility] Pulmonary Care

## 2018-04-11 NOTE — Telephone Encounter (Signed)
Copied from [place] [phone]. Topic: Quick Communication - Rx Refill/Question >> Apr 11, 2018 10:44 AM Nils Flack wrote: Medication: colchicine (COLCRYS) 0.6 MG tablet  Has the patient contacted their pharmacy? Yes.   (Agent: If no, request that the patient contact the pharmacy for the refill.) (Agent: If yes, when and what did the pharmacy advise?)  Preferred Pharmacy (with phone number or street name): [facility] This was previously prescribed by Dr Lenna Gilford and it has expired.   Agent: Please be advised that RX refills may take up to 3 business days. We ask that you follow-up with your pharmacy.

## 2018-04-12 MED ORDER — COLCHICINE 0.6 MG PO TABS: 0.6000 mg | ORAL_TABLET | Freq: Every day | ORAL | 3 refills | Status: DC | PRN

## 2018-04-14 ENCOUNTER — Ambulatory Visit: Payer: Self-pay | Admitting: Internal Medicine

## 2018-04-14 ENCOUNTER — Telehealth (HOSPITAL_COMMUNITY): Payer: Self-pay

## 2018-04-14 NOTE — Telephone Encounter (Signed)
Referral received from MD [facility] for Pulmonary Rehab with diagnosis of COPD mixed. Clinical review of pt follow up appt on 2/3 Pulmonary office note. Pt appropriate for scheduling for Pulmonary rehab.  Will forward to support staff for scheduling and verification of insurance eligibility/benefits with pt consent.   Joycelyn Man, RN, BSN Cardiac and Pulmonary Rehab Nurse

## 2018-04-14 NOTE — Telephone Encounter (Signed)
Pt insurance is active and benefits verified through [place] $5.00, DED $0.00/0 met, out of pocket $5,400/$47.86 met, co-insurance $0.00. no pre-authorization required. REF# [id number]  Will contact patient to see if she is interested in the Pulmonary Rehab Program. Tedra [place]. Support Rep II

## 2018-04-16 ENCOUNTER — Emergency Department (HOSPITAL_BASED_OUTPATIENT_CLINIC_OR_DEPARTMENT_OTHER)
Admission: EM | Admit: 2018-04-16 | Discharge: 2018-04-16 | Disposition: A | Discharge status: Home / Self Care | Payer: Medicare HMO | Attending: Emergency Medicine | Admitting: Emergency Medicine

## 2018-04-16 ENCOUNTER — Emergency Department (HOSPITAL_BASED_OUTPATIENT_CLINIC_OR_DEPARTMENT_OTHER): Payer: Medicare HMO

## 2018-04-16 ENCOUNTER — Other Ambulatory Visit: Payer: Self-pay

## 2018-04-16 ENCOUNTER — Encounter (HOSPITAL_BASED_OUTPATIENT_CLINIC_OR_DEPARTMENT_OTHER): Payer: Self-pay | Admitting: Adult Health

## 2018-04-16 DIAGNOSIS — R109 Unspecified abdominal pain: Secondary | ICD-10-CM | POA: Diagnosis present

## 2018-04-16 DIAGNOSIS — J449 Chronic obstructive pulmonary disease, unspecified: Secondary | ICD-10-CM | POA: Insufficient documentation

## 2018-04-16 DIAGNOSIS — Z9049 Acquired absence of other specified parts of digestive tract: Secondary | ICD-10-CM | POA: Diagnosis not present

## 2018-04-16 DIAGNOSIS — Z9884 Bariatric surgery status: Secondary | ICD-10-CM | POA: Diagnosis not present

## 2018-04-16 DIAGNOSIS — R197 Diarrhea, unspecified: Secondary | ICD-10-CM | POA: Insufficient documentation

## 2018-04-16 DIAGNOSIS — R1084 Generalized abdominal pain: Secondary | ICD-10-CM | POA: Diagnosis not present

## 2018-04-16 DIAGNOSIS — Z66 Do not resuscitate: Secondary | ICD-10-CM | POA: Insufficient documentation

## 2018-04-16 DIAGNOSIS — Z8571 Personal history of Hodgkin lymphoma: Secondary | ICD-10-CM | POA: Diagnosis not present

## 2018-04-16 DIAGNOSIS — E039 Hypothyroidism, unspecified: Secondary | ICD-10-CM | POA: Diagnosis not present

## 2018-04-16 DIAGNOSIS — Z87891 Personal history of nicotine dependence: Secondary | ICD-10-CM | POA: Diagnosis not present

## 2018-04-16 DIAGNOSIS — N183 Chronic kidney disease, stage 3 (moderate): Secondary | ICD-10-CM | POA: Insufficient documentation

## 2018-04-16 DIAGNOSIS — Z8542 Personal history of malignant neoplasm of other parts of uterus: Secondary | ICD-10-CM | POA: Diagnosis not present

## 2018-04-16 DIAGNOSIS — R300 Dysuria: Secondary | ICD-10-CM | POA: Diagnosis not present

## 2018-04-16 DIAGNOSIS — J9811 Atelectasis: Secondary | ICD-10-CM | POA: Diagnosis not present

## 2018-04-16 DIAGNOSIS — J45909 Unspecified asthma, uncomplicated: Secondary | ICD-10-CM | POA: Insufficient documentation

## 2018-04-16 DIAGNOSIS — A09 Infectious gastroenteritis and colitis, unspecified: Secondary | ICD-10-CM | POA: Diagnosis not present

## 2018-04-16 DIAGNOSIS — E1122 Type 2 diabetes mellitus with diabetic chronic kidney disease: Secondary | ICD-10-CM | POA: Insufficient documentation

## 2018-04-16 DIAGNOSIS — R000 Tachycardia, unspecified: Secondary | ICD-10-CM | POA: Diagnosis not present

## 2018-04-16 LAB — COMPREHENSIVE METABOLIC PANEL
ALT: 18 U/L (ref 0–44)
AST: 25 U/L (ref 15–41)
Albumin: 3.8 g/dL (ref 3.5–5.0)
Alkaline Phosphatase: 68 U/L (ref 38–126)
Anion gap: 12 (ref 5–15)
BILIRUBIN TOTAL: 0.8 mg/dL (ref 0.3–1.2)
BUN: 21 mg/dL (ref 8–23)
CO2: 21 mmol/L — ABNORMAL LOW (ref 22–32)
CREATININE: 1.3 mg/dL — AB (ref 0.44–1.00)
Calcium: 9.4 mg/dL (ref 8.9–10.3)
Chloride: 105 mmol/L (ref 98–111)
GFR calc Af Amer: 48 mL/min — ABNORMAL LOW (ref >60)
GFR calc non Af Amer: 42 mL/min — ABNORMAL LOW (ref >60)
Glucose, Bld: 114 mg/dL — ABNORMAL HIGH (ref 70–99)
Potassium: 4.2 mmol/L (ref 3.5–5.1)
Sodium: 138 mmol/L (ref 135–145)
Total Protein: 7.4 g/dL (ref 6.5–8.1)

## 2018-04-16 LAB — CBC WITH DIFFERENTIAL/PLATELET
Abs Immature Granulocytes: 0.19 10*3/uL — ABNORMAL HIGH (ref 0.00–0.07)
Basophils Absolute: 0.1 10*3/uL (ref 0.0–0.1)
Basophils Relative: 1 %
Eosinophils Absolute: 0.1 10*3/uL (ref 0.0–0.5)
Eosinophils Relative: 1 %
HEMATOCRIT: 52.6 % — AB (ref 36.0–46.0)
Hemoglobin: 15.9 g/dL — ABNORMAL HIGH (ref 12.0–15.0)
Immature Granulocytes: 1 %
Lymphocytes Relative: 12 %
Lymphs Abs: 2.2 10*3/uL (ref 0.7–4.0)
MCH: 28.3 pg (ref 26.0–34.0)
MCHC: 30.2 g/dL (ref 30.0–36.0)
MCV: 93.8 fL (ref 80.0–100.0)
MONO ABS: 1.1 10*3/uL — AB (ref 0.1–1.0)
Monocytes Relative: 6 %
Neutro Abs: 15.4 10*3/uL — ABNORMAL HIGH (ref 1.7–7.7)
Neutrophils Relative %: 79 %
Platelets: 472 10*3/uL — ABNORMAL HIGH (ref 150–400)
RBC: 5.61 MIL/uL — ABNORMAL HIGH (ref 3.87–5.11)
RDW: 14.6 % (ref 11.5–15.5)
WBC: 19.1 10*3/uL — ABNORMAL HIGH (ref 4.0–10.5)
nRBC: 0 % (ref 0.0–0.2)

## 2018-04-16 LAB — URINALYSIS, ROUTINE W REFLEX MICROSCOPIC
Glucose, UA: NEGATIVE mg/dL
Ketones, ur: 15 mg/dL — AB
Nitrite: NEGATIVE
Protein, ur: >300 mg/dL — AB
Specific Gravity, Urine: >1.03 — ABNORMAL HIGH (ref 1.005–1.030)
pH: 6 (ref 5.0–8.0)

## 2018-04-16 LAB — LIPASE, BLOOD: Lipase: 29 U/L (ref 11–51)

## 2018-04-16 LAB — C DIFFICILE QUICK SCREEN W PCR REFLEX
C DIFFICILE (CDIFF) TOXIN: NEGATIVE
C Diff antigen: NEGATIVE
C Diff interpretation: NOT DETECTED

## 2018-04-16 LAB — URINALYSIS, MICROSCOPIC (REFLEX): WBC, UA: >50 WBC/hpf (ref 0–5)

## 2018-04-16 LAB — LACTIC ACID, PLASMA: Lactic Acid, Venous: 1.4 mmol/L (ref 0.5–1.9)

## 2018-04-16 LAB — TROPONIN I: Troponin I: <0.03 ng/mL (ref <0.03)

## 2018-04-16 MED ORDER — IPRATROPIUM-ALBUTEROL 0.5-2.5 (3) MG/3ML IN SOLN: 3.0000 mL | Freq: Once | RESPIRATORY_TRACT | Status: DC

## 2018-04-16 MED ORDER — IPRATROPIUM-ALBUTEROL 0.5-2.5 (3) MG/3ML IN SOLN
3.0000 mL | Freq: Four times a day (QID) | RESPIRATORY_TRACT | Status: DC
  Administered 2018-04-16: 3 mL via RESPIRATORY_TRACT
  Filled 2018-04-16: qty 3

## 2018-04-16 MED ORDER — SODIUM CHLORIDE 0.9 % IV BOLUS
500.0000 mL | Freq: Once | INTRAVENOUS | Status: AC
  Administered 2018-04-16: 500 mL via INTRAVENOUS

## 2018-04-16 MED ORDER — SULFAMETHOXAZOLE-TRIMETHOPRIM 800-160 MG PO TABS: 1.0000 | ORAL_TABLET | Freq: Two times a day (BID) | ORAL | 0 refills | Status: AC

## 2018-04-16 NOTE — ED Notes (Signed)
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

## 2018-04-16 NOTE — Discharge Instructions (Signed)
You have been seen in the Emergency Department (ED) for abdominal pain.  Your evaluation did not identify a clear cause of your symptoms but was generally reassuring.  Please follow up as instructed above regarding todays emergent visit and the symptoms that are bothering you.  You do have evidence of a UTI and unfortunately with your pain on urination we should start a different antibiotic.   Return to the ED if your abdominal pain worsens or fails to improve, you develop bloody vomiting, bloody diarrhea, you are unable to tolerate fluids due to vomiting, fever greater than 101, or other symptoms that concern you.

## 2018-04-16 NOTE — ED Notes (Signed)
Bedside commode at bedside.

## 2018-04-16 NOTE — ED Provider Notes (Signed)
Emergency Department Provider Note   I have reviewed the triage vital signs and the nursing notes.   HISTORY  Chief Complaint Abdominal Pain   HPI Rebecca [name] is a 68 y.o. female with PMH of COPD on home O2, GERD, Fibromyalgia, IBS, and elevated BMI presents emergency department for evaluation of cramping abdominal pain with diarrhea.  Patient reports feeling dehydrated.  She had vomiting but does feel nausea.  She reports that shortly after eating or drinking she will get abdominal cramping followed by watery, nonbloody diarrhea.  She is not having fevers or chills.  She is being treated as an outpatient with Keflex for cellulitis and possible septic bursitis of the right elbow.  She states that swelling and pain have improved significantly in the right arm. Her SOB symptoms are slightly worse than normal.   Past Medical History:  Diagnosis Date  . Chest pain   . Chronic venous insufficiency   . COPD (chronic obstructive pulmonary disease) ([place])   . DJD (degenerative joint disease)   . Dysthymia   . Fibromyalgia   . GERD (gastroesophageal reflux disease)   . History of headache   . Hodgkin lymphoma of extranodal or solid organ site [facility])   . Hypothyroid   . IBS (irritable bowel syndrome)   . Metabolic syndrome X   . Morbid obesity ([place])   . OSA (obstructive sleep apnea)   . Vitamin D deficiency     Patient Active Problem List   Diagnosis Date Noted  . DNR (do not resuscitate) 04/05/2018  . CKD (chronic kidney disease) stage 3, GFR 30-59 ml/min ([name]) 03/22/2018  . Umbilical hernia without obstruction and without gangrene 01/18/2018  . Degenerative arthritis of knee, bilateral 05/13/2017  . Osteoarthritis of spine with radiculopathy, cervical region 07/31/2016  . Nocturnal leg cramps 01/09/2015  . Diastolic dysfunction 70/17/7939  . Pain, joint, ankle, left 07/10/2014  . Olecranon bursitis of right elbow 05/17/2014  . Left arm pain 03/10/2014  . Biceps  tendinitis on left 03/10/2014  . Neck pain of over 3 months duration 03/10/2014  . Chronic respiratory failure ([place]) 02/15/2014  . Chest pressure 05/25/2013  . Impaired glucose tolerance 12/08/2011  . Type 2 diabetes mellitus ([place]) 06/26/2011  . Chest pain, atypical 01/14/2011  . Asthma with exacerbation 04/11/2009  . VITAMIN D DEFICIENCY 01/21/2009  . Hypothyroidism 06/24/2007  . METABOLIC SYNDROME X 03/00/9233  . Morbid obesity ([place]) 06/24/2007  . DYSTHYMIA 06/24/2007  . Venous (peripheral) insufficiency 06/24/2007  . Osteoarthritis 06/24/2007  . UTERINE CANCER, HX OF 06/24/2007  . Obstructive sleep apnea 02/28/2007  . COPD mixed type ([place]) 02/28/2007  . GERD 02/28/2007  . Irritable bowel syndrome 02/28/2007    Past Surgical History:  Procedure Laterality Date  . APPENDECTOMY    . BILATERAL SALPINGOOPHORECTOMY  2007   [facility]  . CHOLECYSTECTOMY  1982  . LAPAROSCOPIC GASTRIC BANDING  08/2007   Dr. Hassell Done  . LAPAROSCOPIC HYSTERECTOMY  2007   [facility]  . 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 Heart failure Father   . Cancer Mother   . Parkinson's disease Mother   . Heart attack Sister     Social History Social History   Tobacco Use  . Smoking status: Former Smoker    Packs/day: 1.00    Types: Cigarettes    Last attempt to quit: 03/03/1991    Years since quitting: 27.1  .  Smokeless tobacco: Never Used  Substance Use Topics  . Alcohol use: No    Alcohol/week: 0.0 standard drinks  . Drug use: No    Review of Systems  Constitutional: No fever/chills Eyes: No visual changes. ENT: No sore throat. Cardiovascular: Denies chest pain. Respiratory: Positive shortness of breath. Gastrointestinal: Positive cramping abdominal pain. Positive nausea, no vomiting. Positive diarrhea.  No constipation. Genitourinary: Negative for dysuria. Musculoskeletal: Negative for back pain. Skin: Negative  for rash. Neurological: Negative for headaches, focal weakness or numbness.  10-point ROS otherwise negative.  ____________________________________________   PHYSICAL EXAM:  VITAL SIGNS: ED Triage Vitals  Enc Vitals Group     BP 04/16/18 1231 134/74     Pulse Rate 04/16/18 1231 (!) 117     Resp 04/16/18 1231 (!) 32     Temp 04/16/18 1231 98.2 F (36.8 C)     Temp Source 04/16/18 1231 Oral     SpO2 04/16/18 1231 94 %     Weight 04/16/18 1245 250 lb (113.4 kg)     Height 04/16/18 1245 5\' 6"  (1.676 m)   Constitutional: Alert and oriented. Well appearing and in no acute distress. Eyes: Conjunctivae are normal.  Head: Atraumatic. Nose: No congestion/rhinnorhea. Mouth/Throat: Mucous membranes are moist.  Neck: No stridor.  Cardiovascular: Normal rate, regular rhythm. Good peripheral circulation. Grossly normal heart sounds.   Respiratory: Increased respiratory effort. No retractions. Lungs are diminished throughout without wheezing.  Gastrointestinal: Soft and with mild diffuse tenderness. No focal tenderness, rebound, or guarding. No distention.  Musculoskeletal: No lower extremity tenderness nor edema. No gross deformities of extremities. Neurologic:  Normal speech and language. No gross focal neurologic deficits are appreciated.  Skin:  Skin is warm, dry and intact. No rash noted.  ____________________________________________   LABS (all labs ordered are listed, but only abnormal results are displayed)  Labs Reviewed  CBC WITH DIFFERENTIAL/PLATELET - Abnormal; Notable for the following components:      Result Value   WBC 19.1 (*)    RBC 5.61 (*)    Hemoglobin 15.9 (*)    HCT 52.6 (*)    Platelets 472 (*)    Neutro Abs 15.4 (*)    Monocytes Absolute 1.1 (*)    Abs Immature Granulocytes 0.19 (*)    All other components within normal limits  URINALYSIS, ROUTINE W REFLEX MICROSCOPIC - Abnormal; Notable for the following components:   APPearance CLOUDY (*)    Specific  Gravity, Urine >1.030 (*)    Hgb urine dipstick LARGE (*)    Bilirubin Urine SMALL (*)    Ketones, ur 15 (*)    Protein, ur >300 (*)    Leukocytes,Ua MODERATE (*)    All other components within normal limits  COMPREHENSIVE METABOLIC PANEL - Abnormal; Notable for the following components:   CO2 21 (*)    Glucose, Bld 114 (*)    Creatinine, Ser 1.30 (*)    GFR calc non Af Amer 42 (*)    GFR calc Af Amer 48 (*)    All other components within normal limits  URINALYSIS, MICROSCOPIC (REFLEX) - Abnormal; Notable for the following components:   Bacteria, UA MANY (*)    All other components within normal limits  C DIFFICILE QUICK SCREEN W PCR REFLEX  URINE CULTURE  GASTROINTESTINAL PANEL BY PCR, STOOL (REPLACES STOOL CULTURE)  TROPONIN I  LACTIC ACID, PLASMA  LIPASE, BLOOD   ____________________________________________  EKG   EKG Interpretation  Date/Time:  Saturday April 16 2018 13:26:10 EST  Ventricular Rate:  110 PR Interval:    QRS Duration: 106 QT Interval:  323 QTC Calculation: 437 R Axis:   90 Text Interpretation:  Sinus tachycardia Borderline right axis deviation No STEMI.  Confirmed by Nanda Quinton [phone]) on 04/16/2018 1:35:26 PM       ____________________________________________  RADIOLOGY  Dg Chest Portable 1 View  Result Date: 04/16/2018 CLINICAL DATA:  Nausea and abdominal pain. Diarrhea and shortness of breath. EXAM: PORTABLE CHEST 1 VIEW COMPARISON:  March 16, 2017 FINDINGS: Emphysematous changes in the upper lobes. The heart, hila, and mediastinum are unchanged. No pulmonary nodules or masses. Mild atelectasis in the right base. No focal infiltrate. IMPRESSION: Emphysematous changes in the upper lobes with redistribution of blood flow to the bases. Mild atelectasis in the right base. No other interval changes. Electronically Signed   By: Dorise Bullion III M.D   On: 04/16/2018 14:10     ____________________________________________   PROCEDURES  Procedure(s) performed:   Procedures  None ____________________________________________   INITIAL IMPRESSION / ASSESSMENT AND PLAN / ED COURSE  Pertinent labs & imaging results that were available during my care of the patient were reviewed by me and considered in my medical decision making (see chart for details).  Patient presents to the emergency department with diarrhea symptoms in the setting of taking Keflex for her right arm cellulitis/bursitis.  She is also having increased shortness of breath.  Increased work of breathing on exam with no significant hypoxemia on her baseline 2 L.  She is diffusely diminished without wheezing.  Plan for DuoNeb and reassess.  Plan for screening labs and IV fluids.  No focal tenderness on exam to suspect diverticulitis or abscess.  Patient is at elevated risk for C. Difficile.  Plan to send culture if able to provide sample. Umbilical hernia is soft and non-tender.   Patient's chest x-ray reviewed with no acute findings.  She is feeling much better after IV fluids and nebulizer treatment.  She reports feeling like "a new person."  She was able to provide a stool sample in the emergency department which was sent for C. difficile and stool culture.  Patient's urinalysis does show evidence of infection and patient has dysuria in the emergency department.  Unfortunately, will have to discontinue her Keflex and start Bactrim.  The right elbow is looking much improved.  Patient feeling well and ready for discharge.  She has follow-up CT scan later this week.  Discussed ED return precautions in detail.  She is using her baseline oxygen in the emergency department that she uses at home.  Breathing comfortably. ____________________________________________  FINAL CLINICAL IMPRESSION(S) / ED DIAGNOSES  Final diagnoses:  Diarrhea of presumed infectious origin  Generalized abdominal pain  Dysuria      MEDICATIONS GIVEN DURING THIS VISIT:  Medications  sodium chloride 0.9 % bolus 500 mL (0 mLs Intravenous Stopped 04/16/18 1432)     NEW OUTPATIENT MEDICATIONS STARTED DURING THIS VISIT:  Discharge Medication List as of 04/16/2018  4:04 PM    START taking these medications   Details  sulfamethoxazole-trimethoprim (BACTRIM DS,SEPTRA DS) 800-160 MG tablet Take 1 tablet by mouth 2 (two) times daily for 7 days., Starting Sat 04/16/2018, Until Sat 04/23/2018, Print        Note:  This document was prepared using Dragon voice recognition software and may include unintentional dictation errors.  Nanda Quinton, MD Emergency Medicine    Long, [facility], MD 04/16/18 Karl Bales

## 2018-04-16 NOTE — ED Triage Notes (Signed)
Presents with severe abdominal pain nausea, diarrhea. She has decreased urine production as well. She has been on Keflex for right arm cellulitis. She has a HX of COPD and is 02 dependent.

## 2018-04-17 ENCOUNTER — Emergency Department (HOSPITAL_BASED_OUTPATIENT_CLINIC_OR_DEPARTMENT_OTHER)
Admission: EM | Admit: 2018-04-17 | Discharge: 2018-04-17 | Disposition: A | Discharge status: Home / Self Care | Payer: Medicare HMO | Attending: Emergency Medicine | Admitting: Emergency Medicine

## 2018-04-17 ENCOUNTER — Other Ambulatory Visit: Payer: Self-pay

## 2018-04-17 ENCOUNTER — Encounter (HOSPITAL_BASED_OUTPATIENT_CLINIC_OR_DEPARTMENT_OTHER): Payer: Self-pay | Admitting: Emergency Medicine

## 2018-04-17 DIAGNOSIS — Z87891 Personal history of nicotine dependence: Secondary | ICD-10-CM | POA: Diagnosis not present

## 2018-04-17 DIAGNOSIS — J449 Chronic obstructive pulmonary disease, unspecified: Secondary | ICD-10-CM | POA: Diagnosis not present

## 2018-04-17 DIAGNOSIS — N183 Chronic kidney disease, stage 3 (moderate): Secondary | ICD-10-CM | POA: Insufficient documentation

## 2018-04-17 DIAGNOSIS — E1122 Type 2 diabetes mellitus with diabetic chronic kidney disease: Secondary | ICD-10-CM | POA: Diagnosis not present

## 2018-04-17 DIAGNOSIS — R197 Diarrhea, unspecified: Secondary | ICD-10-CM

## 2018-04-17 DIAGNOSIS — Z8542 Personal history of malignant neoplasm of other parts of uterus: Secondary | ICD-10-CM | POA: Insufficient documentation

## 2018-04-17 DIAGNOSIS — Z79899 Other long term (current) drug therapy: Secondary | ICD-10-CM | POA: Insufficient documentation

## 2018-04-17 DIAGNOSIS — Z66 Do not resuscitate: Secondary | ICD-10-CM | POA: Insufficient documentation

## 2018-04-17 DIAGNOSIS — J45909 Unspecified asthma, uncomplicated: Secondary | ICD-10-CM | POA: Diagnosis not present

## 2018-04-17 DIAGNOSIS — E039 Hypothyroidism, unspecified: Secondary | ICD-10-CM | POA: Insufficient documentation

## 2018-04-17 DIAGNOSIS — R0602 Shortness of breath: Secondary | ICD-10-CM

## 2018-04-17 LAB — GASTROINTESTINAL PANEL BY PCR, STOOL (REPLACES STOOL CULTURE)

## 2018-04-17 MED ORDER — IPRATROPIUM-ALBUTEROL 0.5-2.5 (3) MG/3ML IN SOLN
3.0000 mL | Freq: Once | RESPIRATORY_TRACT | Status: AC
  Administered 2018-04-17: 3 mL via RESPIRATORY_TRACT
  Filled 2018-04-17: qty 3

## 2018-04-17 MED ORDER — LOPERAMIDE HCL 2 MG PO CAPS: 2.0000 mg | ORAL_CAPSULE | Freq: Four times a day (QID) | ORAL | 0 refills | Status: DC | PRN

## 2018-04-17 MED ORDER — LOPERAMIDE HCL 2 MG PO CAPS
4.0000 mg | ORAL_CAPSULE | Freq: Once | ORAL | Status: AC
  Administered 2018-04-17: 4 mg via ORAL
  Filled 2018-04-17: qty 2

## 2018-04-17 NOTE — Discharge Instructions (Addendum)
Your oxygen level stayed above 90% when you walked.  However the diarrhea may be causing you to feel weak and somewhat dehydrated.  Continue using your electrolyte solution and water.  Also try using Imodium to slow the diarrhea down.  A probiotic would also be a good idea.  If you no longer her having pain with urinating you can hold starting the Bactrim.  Your labs yesterday looked good with normal electrolytes and kidney function.  Your C. difficile testing was negative.  Continue to use your inhalers as needed at home and your oxygen. While you continue to have diarrhea you may only need to take your Lasix every other day and you may want to half the dose to 40 mg only while diarrhea persists.  If the diarrhea stops go back to your normal 80 mg a day.  Also if you start noticed swelling in your legs or fullness in your abdomen you can also go back to the 80 mg/day

## 2018-04-17 NOTE — ED Notes (Signed)
ED Provider at bedside. 

## 2018-04-17 NOTE — ED Provider Notes (Signed)
[facility] EMERGENCY DEPARTMENT Provider Note   CSN: [id number] Arrival date & time: 04/17/18  1147     History   Chief Complaint Chief Complaint  Patient presents with  . Shortness of Breath    HPI Rebecca [name] is a 68 y.o. female.  Patient is a 68 year old female with a significant past medical history of COPD on 2 L of oxygen chronically, morbid obesity status post lap band procedure, CKD, diabetes, venous insufficiency who is presenting today with several complaints.  Patient was seen yesterday for diarrhea and shortness of breath.  She was given IV fluids and a breathing treatment and was noted to feel much better.  She had lab work done yesterday including a CBC, CMP, stool studies, chest x-ray, troponin and EKG.  The lab results were reassuring except for a leukocytosis of uncertain etiology.  Patient was thought to possibly have a urinary tract infection and she states yesterday because there was not much urine when she did try to pee it burned a little.  Since she has been home she has been drinking water and electrolyte solution.  However she states anytime she tries to eat she still has diarrhea.  She states she has had multiple episodes of yellow loose stool since being home.  She also still feels winded with exertion.  She denies feeling short of breath at rest with her oxygen but when she gets up to do think she is short of breath and feels tired.  She denies any chest pain and she has no localized abdominal pain currently.  Her C. difficile did come back negative but she had been on Keflex for an infection which is thought to be the source of her diarrhea.  She has not started the Bactrim yet and states she would really like to take something for the diarrhea.  The history is provided by the patient.  Shortness of Breath  Severity:  Moderate Onset quality:  Gradual Duration:  6 days Timing:  Intermittent Progression:  Waxing and waning Chronicity:   Recurrent Context: activity   Relieved by:  Inhaler and rest Worsened by:  Exertion Ineffective treatments:  None tried Associated symptoms: no chest pain, no cough, no fever, no sputum production and no vomiting   Associated symptoms comment:  Ongoing diarrhea   Past Medical History:  Diagnosis Date  . Chest pain   . Chronic venous insufficiency   . COPD (chronic obstructive pulmonary disease) ([place])   . DJD (degenerative joint disease)   . Dysthymia   . Fibromyalgia   . GERD (gastroesophageal reflux disease)   . History of headache   . Hodgkin lymphoma of extranodal or solid organ site [facility])   . Hypothyroid   . IBS (irritable bowel syndrome)   . Metabolic syndrome X   . Morbid obesity ([place])   . OSA (obstructive sleep apnea)   . Vitamin D deficiency     Patient Active Problem List   Diagnosis Date Noted  . DNR (do not resuscitate) 04/05/2018  . CKD (chronic kidney disease) stage 3, GFR 30-59 ml/min ([name]) 03/22/2018  . Umbilical hernia without obstruction and without gangrene 01/18/2018  . Degenerative arthritis of knee, bilateral 05/13/2017  . Osteoarthritis of spine with radiculopathy, cervical region 07/31/2016  . Nocturnal leg cramps 01/09/2015  . Diastolic dysfunction 76/28/3151  . Pain, joint, ankle, left 07/10/2014  . Olecranon bursitis of right elbow 05/17/2014  . Left arm pain 03/10/2014  . Biceps tendinitis on left 03/10/2014  .  Neck pain of over 3 months duration 03/10/2014  . Chronic respiratory failure ([place]) 02/15/2014  . Chest pressure 05/25/2013  . Impaired glucose tolerance 12/08/2011  . Type 2 diabetes mellitus ([place]) 06/26/2011  . Chest pain, atypical 01/14/2011  . Asthma with exacerbation 04/11/2009  . VITAMIN D DEFICIENCY 01/21/2009  . Hypothyroidism 06/24/2007  . METABOLIC SYNDROME X 94/76/5465  . Morbid obesity ([place]) 06/24/2007  . DYSTHYMIA 06/24/2007  . Venous (peripheral) insufficiency 06/24/2007  . Osteoarthritis 06/24/2007  . UTERINE  CANCER, HX OF 06/24/2007  . Obstructive sleep apnea 02/28/2007  . COPD mixed type ([place]) 02/28/2007  . GERD 02/28/2007  . Irritable bowel syndrome 02/28/2007    Past Surgical History:  Procedure Laterality Date  . APPENDECTOMY    . BILATERAL SALPINGOOPHORECTOMY  2007   [facility]  . CHOLECYSTECTOMY  1982  . LAPAROSCOPIC GASTRIC BANDING  08/2007   Dr. Hassell Done  . LAPAROSCOPIC HYSTERECTOMY  2007   [facility]  . 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      Home Medications    Prior to Admission medications   Medication Sig Start Date End Date Taking? Authorizing Provider  albuterol (VENTOLIN HFA) 108 (90 Base) MCG/ACT inhaler INHALE 2 PUFFS INTO THE LUNGS EVERY 6 HOURS AS NEEDED FOR WHEEZING OR SHORTNESS OF BREATH Patient taking differently: 2 puffs. 2 puffs in the morning and 2 puffs at bedtime 05/03/17   Noralee Space, MD  ALPRAZolam Duanne Moron) 0.5 MG tablet Take 1 tab three times per day as needed for nerves MAX DAILY DOSE = 3 TABLETS Patient taking differently: 0.5 mg at bedtime as needed.  02/07/18   Noralee Space, MD  Apple Cider Vinegar 500 MG TABS Take 1,000 mg by mouth daily.    [provider]  Ascorbic Acid (VITAMIN C) 1000 MG tablet Take 1,000 mg by mouth daily.    [provider]  baclofen (LIORESAL) 10 MG tablet Take 10 mg by mouth. Taking 1 nightly    [provider]  cetirizine (ZYRTEC) 10 MG tablet Take 10 mg by mouth daily.      [provider]  Cholecalciferol (VITAMIN D-3) 5000 UNITS TABS Take 1 tablet by mouth daily.     [provider]  colchicine (COLCRYS) 0.6 MG tablet Take 1 tablet (0.6 mg total) by mouth daily as needed. 04/12/18   Isaac Bliss, Rayford Halsted, MD  COLCRYS 0.6 MG tablet TAKE ONE TABLET BY MOUTH DAILY 04/12/18   Parrett, Fonnie Mu, NP  cyanocobalamin 2000 MCG tablet Take 2,500 mcg by mouth 2 (two) times daily.    [provider]  fluticasone (FLONASE) 50  MCG/ACT nasal spray Place 2 sprays into both nostrils daily as needed.     [provider]  furosemide (LASIX) 40 MG tablet TAKE 1-2 TABLETS ONCE A DAY AS NEEDED FOR SWELLING Patient taking differently: 80 mg. TAKE 1 tab daily 07/16/17   Noralee Space, MD  glucosamine-chondroitin 500-400 MG tablet Take 2 tablets by mouth 2 (two) times daily.     [provider]  guaiFENesin (MUCINEX) 600 MG 12 hr tablet Take 1,600 mg by mouth 2 (two) times daily.    [provider]  ipratropium-albuterol (DUONEB) 0.5-2.5 (3) MG/3ML SOLN Take 3 mLs by nebulization 3 (three) times daily. Patient taking differently: Take 3 mLs by nebulization 2 (two) times daily.  11/18/16   Noralee Space, MD  loperamide (IMODIUM)  2 MG capsule Take 1 capsule (2 mg total) by mouth 4 (four) times daily as needed for diarrhea or loose stools. 04/17/18   Blanchie Dessert, MD  Magnesium 400 MG CAPS Take 3 capsules by mouth 2 (two) times daily.     [provider]  Misc Natural Products (TART CHERRY ADVANCED PO) Take by mouth.    [provider]  montelukast (SINGULAIR) 10 MG tablet Take 1 tablet (10 mg total) by mouth at bedtime. 11/15/17   Noralee Space, MD  NON FORMULARY Cinnamon 2 in the morning    [provider]  OXYGEN Inhale 4 L into the lungs.    [provider]  Spacer/Aero-Holding Chambers (AEROCHAMBER MV) inhaler Use as instructed 04/04/18   Icard, Octavio Graves, DO  sulfamethoxazole-trimethoprim (BACTRIM DS,SEPTRA DS) 800-160 MG tablet Take 1 tablet by mouth 2 (two) times daily for 7 days. 04/16/18 04/23/18  LongWonda Olds, MD  SYMBICORT 160-4.5 MCG/ACT inhaler INHALE 2 PUFFS INTO THE LUNGS 2 TIMES DAILY 09/27/17   Noralee Space, MD  Tiotropium Bromide Monohydrate (SPIRIVA RESPIMAT) 2.5 MCG/ACT AERS Inhale 2 puffs into the lungs daily. 04/04/18   Icard, Octavio Graves, DO  traMADol (ULTRAM) 50 MG tablet Take 1 tablet (50 mg total) by mouth 3 (three) times daily as needed for  moderate pain. 11/15/17   Noralee Space, MD  TURMERIC PO Take 400 mg by mouth 2 (two) times daily.    [provider]  vitamin A 10000 UNIT capsule Take 5,000 Units by mouth daily.     [provider]    Family History Family History  Problem Relation Age of Onset  . Heart failure Father   . Cancer Mother   . Parkinson's disease Mother   . Heart attack Sister     Social History Social History   Tobacco Use  . Smoking status: Former Smoker    Packs/day: 1.00    Types: Cigarettes    Last attempt to quit: 03/03/1991    Years since quitting: 27.1  . Smokeless tobacco: Never Used  Substance Use Topics  . Alcohol use: No    Alcohol/week: 0.0 standard drinks  . Drug use: No     Allergies   Penicillins   Review of Systems Review of Systems  Constitutional: Negative for fever.  Respiratory: Positive for shortness of breath. Negative for cough and sputum production.   Cardiovascular: Negative for chest pain.  Gastrointestinal: Negative for vomiting.  All other systems reviewed and are negative.    Physical Exam Updated Vital Signs BP 112/74 (BP Location: Left Arm)   Pulse (!) 109   Temp 98 F (36.7 C) (Oral)   Resp 20   Ht 5\' 5"  (1.651 m)   Wt 111.1 kg   SpO2 93%   BMI 40.77 kg/m   Physical Exam Vitals signs and nursing note reviewed.  Constitutional:      General: She is not in acute distress.    Appearance: She is well-developed. She is obese.  HENT:     Head: Normocephalic and atraumatic.     Mouth/Throat:     Mouth: Mucous membranes are dry.  Eyes:     Pupils: Pupils are equal, round, and reactive to light.  Cardiovascular:     Rate and Rhythm: Regular rhythm. Tachycardia present.     Heart sounds: Normal heart sounds. No murmur. No friction rub.  Pulmonary:     Effort: Pulmonary effort is normal. No accessory muscle usage or respiratory  distress.     Breath sounds: Decreased breath sounds present. No wheezing or rales.  Abdominal:       General: Bowel sounds are normal. There is no distension.     Palpations: Abdomen is soft.     Tenderness: There is no abdominal tenderness. There is no guarding or rebound.  Musculoskeletal: Normal range of motion.        General: No tenderness.     Right lower leg: No edema.     Left lower leg: No edema.     Comments: No edema  Skin:    General: Skin is warm and dry.     Capillary Refill: Capillary refill takes 2 to 3 seconds.     Findings: No rash.  Neurological:     General: No focal deficit present.     Mental Status: She is alert and oriented to person, place, and time. Mental status is at baseline.     Cranial Nerves: No cranial nerve deficit.  Psychiatric:        Behavior: Behavior normal.      ED Treatments / Results  Labs (all labs ordered are listed, but only abnormal results are displayed) Labs Reviewed - No data to display  EKG None  Radiology Dg Chest Portable 1 View  Result Date: 04/16/2018 CLINICAL DATA:  Nausea and abdominal pain. Diarrhea and shortness of breath. EXAM: PORTABLE CHEST 1 VIEW COMPARISON:  March 16, 2017 FINDINGS: Emphysematous changes in the upper lobes. The heart, hila, and mediastinum are unchanged. No pulmonary nodules or masses. Mild atelectasis in the right base. No focal infiltrate. IMPRESSION: Emphysematous changes in the upper lobes with redistribution of blood flow to the bases. Mild atelectasis in the right base. No other interval changes. Electronically Signed   By: Dorise Bullion III M.D   On: 04/16/2018 14:10    Procedures Procedures (including critical care time)  Medications Ordered in ED Medications  ipratropium-albuterol (DUONEB) 0.5-2.5 (3) MG/3ML nebulizer solution 3 mL (3 mLs Nebulization Given 04/17/18 1204)  loperamide (IMODIUM) capsule 4 mg (4 mg Oral Given 04/17/18 1308)     Initial Impression / Assessment and Plan / ED Course  I have reviewed the triage vital signs and the nursing notes.  Pertinent labs &  imaging results that were available during my care of the patient were reviewed by me and considered in my medical decision making (see chart for details).     Pleasant elderly female presenting today with several complaints.  Stating she is still having diarrhea and does not know what to take for it as well as feeling short of breath with exertion.  She had used all of her inhalers prior to coming in today but states it did not feel like it helped.  Patient did receive IV fluids yesterday and a breathing treatment and states she did feel much better.  However since being home she still had multiple episodes of diarrhea.  C. difficile is negative and GI pathogen panel is still pending.  Suspect diarrhea may be related to the recent antibiotics of Keflex she was taking which she has stopped.  Recommended she try Imodium and a probiotic.  Patient has decreased breath sounds throughout here but no frank wheezing.  She did receive DuoNeb prior to my evaluation.  She states she feels fine when she is at rest but with walking she gets winded easily.  She is not had any chest pain or lower extremity swelling but states since Wednesday when the diarrhea  started she had been holding her 80 mg of Lasix.  Patient's x-ray yesterday was clear and she has no obvious signs of fluid overload today.  Sats have been remained greater than 90%.  Upon arrival she had some mild tachycardia but states she had just recently used a breathing treatment.  With ambulating she became tachycardic to about 120 but oxygen saturation remained greater than 91%.  Low suspicion for pneumonia or acute cardiac issue.  Encourage the patient to take it easy and continue to drink fluids.  Discussed with her that she should resume her Lasix but maybe take half of the dose and possibly every other day while she is having diarrhea and then increase to her normal dose when the diarrhea stops.  Other than a leukocytosis all of her labs from yesterday were  within normal limits including a chest x-ray and renal function.  Do not feel that patient needs repeat labs today and she stating already that she needs to leave to take her friend to the grocery store.  Encourage patient to follow-up with her PCP or return if symptoms worsen.  Final Clinical Impressions(s) / ED Diagnoses   Final diagnoses:  Diarrhea, unspecified type  SOB (shortness of breath)    ED Discharge Orders         Ordered    loperamide (IMODIUM) 2 MG capsule  4 times daily PRN     04/17/18 1351           Blanchie Dessert, MD 04/17/18 1553

## 2018-04-17 NOTE — ED Triage Notes (Signed)
Pt c/o sob. Pt states she has hx of COPD. She has not taken lasix x 5 days. Also c/o diarrhea. Denies abdominal pain or chest pain

## 2018-04-18 LAB — URINE CULTURE: Special Requests: NORMAL

## 2018-04-19 ENCOUNTER — Telehealth: Payer: Self-pay | Admitting: *Deleted

## 2018-04-19 ENCOUNTER — Encounter: Payer: Self-pay | Admitting: Internal Medicine

## 2018-04-19 ENCOUNTER — Ambulatory Visit (INDEPENDENT_AMBULATORY_CARE_PROVIDER_SITE_OTHER): Payer: Medicare HMO | Admitting: Internal Medicine

## 2018-04-19 VITALS — BP 150/80 | HR 115 | Temp 98.1°F | Wt 246.3 lb

## 2018-04-19 DIAGNOSIS — J9601 Acute respiratory failure with hypoxia: Secondary | ICD-10-CM

## 2018-04-19 DIAGNOSIS — D72829 Elevated white blood cell count, unspecified: Secondary | ICD-10-CM

## 2018-04-19 DIAGNOSIS — R197 Diarrhea, unspecified: Secondary | ICD-10-CM | POA: Diagnosis not present

## 2018-04-19 DIAGNOSIS — M7021 Olecranon bursitis, right elbow: Secondary | ICD-10-CM

## 2018-04-19 DIAGNOSIS — J449 Chronic obstructive pulmonary disease, unspecified: Secondary | ICD-10-CM | POA: Diagnosis not present

## 2018-04-19 MED ORDER — ALBUTEROL SULFATE (2.5 MG/3ML) 0.083% IN NEBU
2.5000 mg | INHALATION_SOLUTION | Freq: Once | RESPIRATORY_TRACT | Status: AC
  Administered 2018-04-19: 2.5 mg via RESPIRATORY_TRACT

## 2018-04-19 NOTE — Patient Instructions (Addendum)
Great seeing you again today.  Instructions: -increase water intake -schedule an appt for an annual physical at your earliest convenience, come fasting, so we can draw blood work -we will draw a CBC today to check your blood counts -stay on 4L of oxygen at all times -cont to use inhalers as prescribed -if respiratory symptoms increase or do not improve, please go to the emergency room   Acute Respiratory Failure, Adult  Acute respiratory failure occurs when there is not enough oxygen passing from your lungs to your body. When this happens, your lungs have trouble removing carbon dioxide from the blood. This causes your blood oxygen level to drop too low as carbon dioxide builds up. Acute respiratory failure is a medical emergency. It can develop quickly, but it is temporary if treated promptly. Your lung capacity, or how much air your lungs can hold, may improve with time, exercise, and treatment. What are the causes? There are many possible causes of acute respiratory failure, including:  Lung injury.  Chest injury or damage to the ribs or tissues near the lungs.  Lung conditions that affect the flow of air and blood into and out of the lungs, such as pneumonia, acute respiratory distress syndrome, and cystic fibrosis.  Medical conditions, such as strokes or spinal cord injuries, that affect the muscles and nerves that control breathing.  Blood infection (sepsis).  Inflammation of the pancreas (pancreatitis).  A blood clot in the lungs (pulmonary embolism).  A large-volume blood transfusion.  Burns.  Near-drowning.  Seizure.  Smoke inhalation.  Reaction to medicines.  Alcohol or drug overdose. What increases the risk? This condition is more likely to develop in people who have:  A blocked airway.  Asthma.  A condition or disease that damages or weakens the muscles, nerves, bones, or tissues that are involved in breathing.  A serious infection.  A health  problem that blocks the unconscious reflex that is involved in breathing, such as hypothyroidism or sleep apnea.  A lung injury or trauma. What are the signs or symptoms? Trouble breathing is the main symptom of acute respiratory failure. Symptoms may also include:  Rapid breathing.  Restlessness or anxiety.  Skin, lips, or fingernails that appear blue (cyanosis).  Rapid heart rate.  Abnormal heart rhythms (arrhythmias).  Confusion or changes in behavior.  Tiredness or loss of energy.  Feeling sleepy or having a loss of consciousness. How is this diagnosed? Your health care provider can diagnose acute respiratory failure with a medical history and physical exam. During the exam, your health care provider will listen to your heart and check for crackling or wheezing sounds in your lungs. Your may also have tests to confirm the diagnosis and determine what is causing respiratory failure. These tests may include:  Measuring the amount of oxygen in your blood (pulse oximetry). The measurement comes from a small device that is placed on your finger, earlobe, or toe.  Other blood tests to measure blood gases and to look for signs of infection.  Sampling your cerebral spinal fluid or tracheal fluid to check for infections.  Chest X-ray to look for fluid in spaces that should be filled with air.  Electrocardiogram (ECG) to look at the heart's electrical activity. How is this treated? Treatment for this condition usually takes places in a hospital intensive care unit (ICU). Treatment depends on what is causing the condition. It may include one or more treatments until your symptoms improve. Treatment may include:  Supplemental oxygen. Extra oxygen is  given through a tube in the nose, a face mask, or a hood.  A device such as a continuous positive airway pressure (CPAP) or bi-level positive airway pressure (BiPAP or BPAP) machine. This treatment uses mild air pressure to keep the airways  open. A mask or other device will be placed over your nose or mouth. A tube that is connected to a motor will deliver oxygen through the mask.  Ventilator. This treatment helps move air into and out of the lungs. This may be done with a bag and mask or a machine. For this treatment, a tube is placed in your windpipe (trachea) so air and oxygen can flow to the lungs.  Extracorporeal membrane oxygenation (ECMO). This treatment temporarily takes over the function of the heart and lungs, supplying oxygen and removing carbon dioxide. ECMO gives the lungs a chance to recover. It may be used if a ventilator is not effective.  Tracheostomy. This is a procedure that creates a hole in the neck to insert a breathing tube.  Receiving fluids and medicines.  Rocking the bed to help breathing. Follow these instructions at home:  Take over-the-counter and prescription medicines only as told by your health care provider.  Return to normal activities as told by your health care provider. Ask your health care provider what activities are safe for you.  Keep all follow-up visits as told by your health care provider. This is important. How is this prevented? Treating infections and medical conditions that may lead to acute respiratory failure can help prevent the condition from developing. Contact a health care provider if:  You have a fever.  Your symptoms do not improve or they get worse. Get help right away if:  You are having trouble breathing.  You lose consciousness.  Your have cyanosis or turn blue.  You develop a rapid heart rate.  You are confused. These symptoms may represent a serious problem that is an emergency. Do not wait to see if the symptoms will go away. Get medical help right away. Call your local emergency services (911 in the [place] Do not drive yourself to the hospital. This information is not intended to replace advice given to you by your health care provider. Make sure you  discuss any questions you have with your health care provider. Document Released: 02/21/2013 Document Revised: 09/14/2015 Document Reviewed: 09/04/2015 Elsevier Interactive Patient Education  2019 [employer].

## 2018-04-19 NOTE — Telephone Encounter (Signed)
Post ED Visit - Positive Culture Follow-up  Culture report reviewed by antimicrobial stewardship pharmacist:  [x]  Elenor Quinones, Pharm.D. []  Heide Guile, Pharm.D., BCPS AQ-ID []  Parks Neptune, Pharm.D., BCPS []  Alycia Rossetti, Pharm.D., BCPS []  [place], Pharm.D., BCPS, AAHIVP []  Legrand Como, Pharm.D., BCPS, AAHIVP []  Salome Arnt, PharmD, BCPS []  Johnnette Gourd, PharmD, BCPS []  Hughes Better, PharmD, BCPS []  Leeroy Cha, PharmD  Positive urine culture Treated with Sulfamethoxazole-Trimethoprim, organism sensitive to the same and no further patient follow-up is required at this time.  [name] [facility] 04/19/2018, 8:42 AM

## 2018-04-19 NOTE — Progress Notes (Signed)
Established Patient Office Visit     CC/Reason for Visit: hospital follow up for diarrhea  HPI: Rebecca [name] is a 68 y.o. female who is coming in today for the above mentioned reason. Past Medical History is significant for copd chronically on oxygen.  Patient sts she had abdominal cramping and dirrhea and sts she finally went to the ED for dehydration.  At the ED, they dx her with a UTI and gave her bactrim to take.  She took imodium and no longer has diarrhea.  She went a second time to the ED this past Saturday and was given a breathing treatment for shortness of breath.  Patient sts she has an appointment on 04-26-18 with pulmonary at [facility].  Today patient is short of breath and has some labored breathing.  She is using symbicort, albuterol and spirvia.  4L of oxygen when she gets up and moves around, but no oxygen when she is sitting still.  Here at the office her oxygen sits at 83% on 4L when she is walking to the exam room.  Sitting in the exam room her oxygen is at 88%.  Patient was told that she needed to go directly to the emergency room for her COPD excerbation, but patient refuses.  We will give her a breathing tx here in the office and reaccess her.  After breathing tx her breath sounds have slightly improved.  Patients elbow brusitis has improved since last visit, but is still swollen and red; picture will be added in the PE section.  We will cont to monitor this elbow and she will notify the office is symptoms gets worse.     Past Medical/Surgical History: Past Medical History:  Diagnosis Date  . Chest pain   . Chronic venous insufficiency   . COPD (chronic obstructive pulmonary disease) ([place])   . DJD (degenerative joint disease)   . Dysthymia   . Fibromyalgia   . GERD (gastroesophageal reflux disease)   . History of headache   . Hodgkin lymphoma of extranodal or solid organ site [facility])   . Hypothyroid   . IBS (irritable bowel syndrome)   . Metabolic  syndrome X   . Morbid obesity ([place])   . OSA (obstructive sleep apnea)   . Vitamin D deficiency     Past Surgical History:  Procedure Laterality Date  . APPENDECTOMY    . BILATERAL SALPINGOOPHORECTOMY  2007   [place]  . CHOLECYSTECTOMY  1982  . LAPAROSCOPIC GASTRIC BANDING  08/2007   Dr. Hassell Done  . LAPAROSCOPIC HYSTERECTOMY  2007   [facility]  . 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 Her smoking use included cigarettes. She smoked 1.00 pack per day. She has never used smokeless tobacco. She reports that she does not drink alcohol or use drugs.  Allergies: Allergies  Allergen Reactions  . Penicillins Nausea Only and Nausea And Vomiting    REACTION: nausea and vomiting REACTION: nausea and vomiting REACTION: nausea and vomiting    Family History:  Family History  Problem Relation Age of Onset  . Heart failure Father   . Cancer Mother   . Parkinson's disease Mother   . Heart attack Sister      Current Outpatient Medications:  .  albuterol (VENTOLIN HFA) 108 (90 Base) MCG/ACT inhaler, INHALE 2 PUFFS INTO THE LUNGS EVERY 6 HOURS AS NEEDED FOR WHEEZING OR SHORTNESS OF  BREATH (Patient taking differently: 2 puffs. 2 puffs in the morning and 2 puffs at bedtime), Disp: 54 each, Rfl: 4 .  ALPRAZolam (XANAX) 0.5 MG tablet, Take 1 tab three times per day as needed for nerves MAX DAILY DOSE = 3 TABLETS (Patient taking differently: 0.5 mg at bedtime as needed. ), Disp: 270 tablet, Rfl: 1 .  Apple Cider Vinegar 500 MG TABS, Take 1,000 mg by mouth daily., Disp: , Rfl:  .  Ascorbic Acid (VITAMIN C) 1000 MG tablet, Take 1,000 mg by mouth daily., Disp: , Rfl:  .  baclofen (LIORESAL) 10 MG tablet, Take 10 mg by mouth. Taking 1 nightly, Disp: , Rfl:  .  cetirizine (ZYRTEC) 10 MG tablet, Take 10 mg by mouth daily.  , Disp: , Rfl:  .  Cholecalciferol (VITAMIN D-3) 5000 UNITS TABS, Take 1 tablet by mouth daily. , Disp: , Rfl:  .   colchicine (COLCRYS) 0.6 MG tablet, Take 1 tablet (0.6 mg total) by mouth daily as needed., Disp: 30 tablet, Rfl: 3 .  COLCRYS 0.6 MG tablet, TAKE ONE TABLET BY MOUTH DAILY, Disp: 30 tablet, Rfl: 0 .  cyanocobalamin 2000 MCG tablet, Take 2,500 mcg by mouth 2 (two) times daily., Disp: , Rfl:  .  fluticasone (FLONASE) 50 MCG/ACT nasal spray, Place 2 sprays into both nostrils daily as needed. , Disp: , Rfl:  .  furosemide (LASIX) 40 MG tablet, TAKE 1-2 TABLETS ONCE A DAY AS NEEDED FOR SWELLING (Patient taking differently: 80 mg. TAKE 1 tab daily), Disp: 60 tablet, Rfl: 11 .  glucosamine-chondroitin 500-400 MG tablet, Take 2 tablets by mouth 2 (two) times daily. , Disp: , Rfl:  .  guaiFENesin (MUCINEX) 600 MG 12 hr tablet, Take 1,600 mg by mouth 2 (two) times daily., Disp: , Rfl:  .  ipratropium-albuterol (DUONEB) 0.5-2.5 (3) MG/3ML SOLN, Take 3 mLs by nebulization 3 (three) times daily. (Patient taking differently: Take 3 mLs by nebulization 2 (two) times daily. ), Disp: 270 mL, Rfl: 7 .  loperamide (IMODIUM) 2 MG capsule, Take 1 capsule (2 mg total) by mouth 4 (four) times daily as needed for diarrhea or loose stools., Disp: 12 capsule, Rfl: 0 .  Magnesium 400 MG CAPS, Take 3 capsules by mouth 2 (two) times daily. , Disp: , Rfl:  .  Misc Natural Products (TART CHERRY ADVANCED PO), Take by mouth., Disp: , Rfl:  .  montelukast (SINGULAIR) 10 MG tablet, Take 1 tablet (10 mg total) by mouth at bedtime., Disp: 30 tablet, Rfl: 6 .  NON FORMULARY, Cinnamon 2 in the morning, Disp: , Rfl:  .  OXYGEN, Inhale 4 L into the lungs., Disp: , Rfl:  .  Spacer/Aero-Holding Chambers (AEROCHAMBER MV) inhaler, Use as instructed, Disp: 1 each, Rfl: 0 .  sulfamethoxazole-trimethoprim (BACTRIM DS,SEPTRA DS) 800-160 MG tablet, Take 1 tablet by mouth 2 (two) times daily for 7 days., Disp: 14 tablet, Rfl: 0 .  SYMBICORT 160-4.5 MCG/ACT inhaler, INHALE 2 PUFFS INTO THE LUNGS 2 TIMES DAILY, Disp: 1 Inhaler, Rfl: 5 .  Tiotropium  Bromide Monohydrate (SPIRIVA RESPIMAT) 2.5 MCG/ACT AERS, Inhale 2 puffs into the lungs daily., Disp: 1 Inhaler, Rfl: 0 .  traMADol (ULTRAM) 50 MG tablet, Take 1 tablet (50 mg total) by mouth 3 (three) times daily as needed for moderate pain., Disp: 90 tablet, Rfl: 3 .  TURMERIC PO, Take 400 mg by mouth 2 (two) times daily., Disp: , Rfl:  .  vitamin A 10000 UNIT capsule, Take 5,000 Units by  mouth daily. , Disp: , Rfl:   Review of Systems:  Constitutional: Denies fever, chills, diaphoresis. C/o decreased appetite and weight loss of 10 pounds since last visit HEENT: Denies photophobia, eye pain, redness, hearing loss, ear pain, congestion, sore throat, rhinorrhea, sneezing, mouth sores, trouble swallowing, neck pain, neck stiffness and tinnitus.   Respiratory: Denies cough, chest tightness, and wheezing.  C/o shortness of breath, labored breathing and DOE Cardiovascular: Denies chest pain, palpitations and leg swelling.  Gastrointestinal: Denies nausea, vomiting, abdominal pain, diarrhea, constipation, blood in stool and abdominal distention.  Genitourinary: Denies dysuria, urgency, frequency, hematuria, flank pain and difficulty urinating.  Endocrine: Denies: hot or cold intolerance, sweats, changes in hair or nails, polyuria, polydipsia. Musculoskeletal: Denies myalgias, back pain, joint swelling, arthralgias and gait problem.  Skin: Denies pallor.  C/o right elbow tenderness and thinned layer of skin Neurological: Denies dizziness, seizures, syncope, weakness, light-headedness, numbness and headaches.  Hematological: Denies adenopathy. Easy bruising, personal or family bleeding history  Psychiatric/Behavioral: Denies suicidal ideation, mood changes, confusion, nervousness, sleep disturbance and agitation  Physical Exam: Vitals:   04/19/18 1436  BP: (!) 150/80  Pulse: (!) 115  Temp: 98.1 F (36.7 C)  TempSrc: Oral  SpO2: 94%  Weight: 246 lb 4.8 oz (111.7 kg)    Body mass index is 40.99  kg/m.   Constitutional: NAD, calm Eyes: PERRL, lids and conjunctivae normal Respiratory: very decreased breath sounds  bilaterally, no wheezing, no crackles. increased respiratory effort. Moderate amount of accessory muscle use, pursed lip breathing Cardiovascular: Regular rate and rhythm, no murmurs / rubs / gallops. No extremity edema. 2+ pedal pulses.  Musculoskeletal: no clubbing / cyanosis. No joint deformity upper and lower extremities. Good ROM, no contractures. Normal muscle tone.  Skin: no rashes, lesions, ulcers. Bursitis to right elbow, red, swollen and tender; thinning of skin to elbow        Impression and Plan:  Acute on chronic respiratory failure with hypoxia ([name]) COPD with acute exacerbation ([name])   -Significant respiratory distress today and hypoxemia despite 4 L oxygen. -Was given a breathing treatment in the office with some improvement. -suggested taking prednisone, but patient refused. She thinks this could mess up her gut flora and cause more diarrhea. -I have advised she proceed to the ED given her significant SOB and hypoxemia, but she refuses. Have explained that this could potentially be life-threatening. -stay on 4L of oxygen at all times -take inhalers as rxn  Olecranon bursitis of right elbow -improvement, but still swollen, red and tender -cont to monitor, if symptoms don't continue to improve over next 2-3 weeks, will consider referral to sports medicine for drainage. -completed course of keflex prescribed recently and definitely improved from then.  Diarrhea of presumed infectious origin -resolved -will recheck CBC as her WBCs were 19 in the ED this past week.    Patient Instructions  Doristine Devoid seeing you again today.  Instructions: -increase water intake -schedule an appt for an annual physical at your earliest convenience, come fasting, so we can draw blood work -we will draw a CBC today to check your blood counts -stay on 4L of oxygen at  all times -cont to use inhalers as prescribed -if respiratory symptoms increase or do not improve, please go to the emergency room   Acute Respiratory Failure, Adult  Acute respiratory failure occurs when there is not enough oxygen passing from your lungs to your body. When this happens, your lungs have trouble removing carbon dioxide from the blood. This causes  your blood oxygen level to drop too low as carbon dioxide builds up. Acute respiratory failure is a medical emergency. It can develop quickly, but it is temporary if treated promptly. Your lung capacity, or how much air your lungs can hold, may improve with time, exercise, and treatment. What are the causes? There are many possible causes of acute respiratory failure, including:  Lung injury.  Chest injury or damage to the ribs or tissues near the lungs.  Lung conditions that affect the flow of air and blood into and out of the lungs, such as pneumonia, acute respiratory distress syndrome, and cystic fibrosis.  Medical conditions, such as strokes or spinal cord injuries, that affect the muscles and nerves that control breathing.  Blood infection (sepsis).  Inflammation of the pancreas (pancreatitis).  A blood clot in the lungs (pulmonary embolism).  A large-volume blood transfusion.  Burns.  Near-drowning.  Seizure.  Smoke inhalation.  Reaction to medicines.  Alcohol or drug overdose. What increases the risk? This condition is more likely to develop in people who have:  A blocked airway.  Asthma.  A condition or disease that damages or weakens the muscles, nerves, bones, or tissues that are involved in breathing.  A serious infection.  A health problem that blocks the unconscious reflex that is involved in breathing, such as hypothyroidism or sleep apnea.  A lung injury or trauma. What are the signs or symptoms? Trouble breathing is the main symptom of acute respiratory failure. Symptoms may also  include:  Rapid breathing.  Restlessness or anxiety.  Skin, lips, or fingernails that appear blue (cyanosis).  Rapid heart rate.  Abnormal heart rhythms (arrhythmias).  Confusion or changes in behavior.  Tiredness or loss of energy.  Feeling sleepy or having a loss of consciousness. How is this diagnosed? Your health care provider can diagnose acute respiratory failure with a medical history and physical exam. During the exam, your health care provider will listen to your heart and check for crackling or wheezing sounds in your lungs. Your may also have tests to confirm the diagnosis and determine what is causing respiratory failure. These tests may include:  Measuring the amount of oxygen in your blood (pulse oximetry). The measurement comes from a small device that is placed on your finger, earlobe, or toe.  Other blood tests to measure blood gases and to look for signs of infection.  Sampling your cerebral spinal fluid or tracheal fluid to check for infections.  Chest X-ray to look for fluid in spaces that should be filled with air.  Electrocardiogram (ECG) to look at the heart's electrical activity. How is this treated? Treatment for this condition usually takes places in a hospital intensive care unit (ICU). Treatment depends on what is causing the condition. It may include one or more treatments until your symptoms improve. Treatment may include:  Supplemental oxygen. Extra oxygen is given through a tube in the nose, a face mask, or a hood.  A device such as a continuous positive airway pressure (CPAP) or bi-level positive airway pressure (BiPAP or BPAP) machine. This treatment uses mild air pressure to keep the airways open. A mask or other device will be placed over your nose or mouth. A tube that is connected to a motor will deliver oxygen through the mask.  Ventilator. This treatment helps move air into and out of the lungs. This may be done with a bag and mask or a  machine. For this treatment, a tube is placed in your windpipe (  trachea) so air and oxygen can flow to the lungs.  Extracorporeal membrane oxygenation (ECMO). This treatment temporarily takes over the function of the heart and lungs, supplying oxygen and removing carbon dioxide. ECMO gives the lungs a chance to recover. It may be used if a ventilator is not effective.  Tracheostomy. This is a procedure that creates a hole in the neck to insert a breathing tube.  Receiving fluids and medicines.  Rocking the bed to help breathing. Follow these instructions at home:  Take over-the-counter and prescription medicines only as told by your health care provider.  Return to normal activities as told by your health care provider. Ask your health care provider what activities are safe for you.  Keep all follow-up visits as told by your health care provider. This is important. How is this prevented? Treating infections and medical conditions that may lead to acute respiratory failure can help prevent the condition from developing. Contact a health care provider if:  You have a fever.  Your symptoms do not improve or they get worse. Get help right away if:  You are having trouble breathing.  You lose consciousness.  Your have cyanosis or turn blue.  You develop a rapid heart rate.  You are confused. These symptoms may represent a serious problem that is an emergency. Do not wait to see if the symptoms will go away. Get medical help right away. Call your local emergency services (911 in the [place] Do not drive yourself to the hospital. This information is not intended to replace advice given to you by your health care provider. Make sure you discuss any questions you have with your health care provider. Document Released: 02/21/2013 Document Revised: 09/14/2015 Document Reviewed: 09/04/2015 Elsevier Interactive Patient Education  2019 [place], RN DNP  Student [facility] at [facility]

## 2018-04-20 LAB — CBC
HCT: 45.6 % (ref 36.0–46.0)
HEMOGLOBIN: 14.4 g/dL (ref 12.0–15.0)
MCHC: 31.6 g/dL (ref 30.0–36.0)
MCV: 91.4 fl (ref 78.0–100.0)
Platelets: 382 10*3/uL (ref 150.0–400.0)
RBC: 4.98 Mil/uL (ref 3.87–5.11)
RDW: 15.5 % (ref 11.5–15.5)
WBC: 12.8 10*3/uL — ABNORMAL HIGH (ref 4.0–10.5)

## 2018-04-25 ENCOUNTER — Encounter (HOSPITAL_COMMUNITY): Payer: Self-pay

## 2018-04-25 ENCOUNTER — Encounter (HOSPITAL_COMMUNITY)
Admission: RE | Admit: 2018-04-25 | Discharge: 2018-04-25 | Disposition: A | Discharge status: Home / Self Care | Payer: Medicare HMO | Source: Ambulatory Visit | Attending: Pulmonary Disease | Admitting: Pulmonary Disease

## 2018-04-25 VITALS — BP 106/51 | HR 87 | Temp 98.4°F | Resp 16 | Ht 63.0 in | Wt 249.6 lb

## 2018-04-25 DIAGNOSIS — J449 Chronic obstructive pulmonary disease, unspecified: Secondary | ICD-10-CM

## 2018-04-25 NOTE — Progress Notes (Signed)
Rebecca [name] 68 y.o. female Pulmonary Rehab Orientation Note Rebecca [name] who is referred to pulmonary rehab by her local pulmonologist Dr. Valeta Harms for COPD.  Pt also had in her hand a referral for pulmonary rehab from Dr. Nancie Neas at [facility]. Rebecca [name] arrived today in Cardiac and Pulmonary Rehab for orientation to Pulmonary Rehab. She was transported from [employer] via wheel chair. She does carry portable oxygen. Pt DME is [facility].  Per pt, she uses oxygen with exercise. Color good, skin warm and dry. Patient is oriented to time and place. Patient's medical history, psychosocial health, and medications reviewed. Psychosocial assessment reveals pt lives with  2 roommates. Pt is currently retired. 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  Pt hobbies include taking care of her dog and watching cooking shows. Pt reports her stress level is moderate. Areas of stress/anxiety include Health Finances.  Pt does not exhibit signs of depression. Pt with past history of depression and continues to see a psychologist for eating disorder every two weeks.  This has become more social than a typical therapy sessions.  Pt began to see him when she had her lap band surgery. Pt used food in the past as a comfort during difficult times. PHQ2/9 score 2/7. Pt shows fair  coping skills with mostly  positive outlook.  Pt is hopeful that she will qualify for a Zephyr valve at [facility].  This valve will block of bronchioles that are no longer effective due to COPD.  This will allow other bronchioles that are healthy to receive oxygen therapy.  Pt is in the very beginning stages of determination.  Pt remains hopeful.  Will continue to monitor and evaluate progress toward psychosocial goal(s) of continued well being mentally and physically able to get the Zephyr Valve. . Physical assessment reveals heart rate is normal, breath sounds diminished, mild expiratory wheezing heard RLL which cleared with coughing. Grip strength equal, strong.  Distal pulses palpable with trace swelling. Patient reports she  does take medications as prescribed. Patient states she follows a Regular diet but per pt does not eat very much and does not Sugrue unless in microwave. Pt desires to lose weight but has been unsuccessful in the past and has had the lap band procedure. Patient's weight will be monitored closely. Demonstration and practice of PLB using pulse oximeter. Patient able to return demonstration satisfactorily. Safety and hand hygiene in the exercise area reviewed with patient. Patient voices understanding of the information reviewed. Pt participated in pulmonary rehab some years back.  Pt did end up being discharged due to non compliance of pulmonary rehab rules of conduct.  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 Department expectations discussed with patient and achievable goals were set. The patient shows enthusiasm about attending the program and we look forward to working with this nice lady The patient is scheduled for a 6 min walk test on 05/03/18 and to begin exercise on 05/10/18 at 1:30pm. 45 minutes was spent on a variety of activities such as assessment of the patient, obtaining baseline data including height, weight, BMI, and grip strength, verifying medical history, allergies, and current medications, and teaching patient strategies for performing tasks with less respiratory effort with emphasis on pursed lip breathing. [place], BSN Cardiac and Training and development officer

## 2018-04-26 ENCOUNTER — Encounter: Payer: Self-pay | Admitting: Pulmonary Disease

## 2018-04-26 ENCOUNTER — Ambulatory Visit (INDEPENDENT_AMBULATORY_CARE_PROVIDER_SITE_OTHER): Payer: Medicare HMO | Admitting: Pulmonary Disease

## 2018-04-26 VITALS — BP 118/68 | HR 105 | Ht 63.0 in | Wt 248.4 lb

## 2018-04-26 DIAGNOSIS — J449 Chronic obstructive pulmonary disease, unspecified: Secondary | ICD-10-CM

## 2018-04-26 DIAGNOSIS — J9610 Chronic respiratory failure, unspecified whether with hypoxia or hypercapnia: Secondary | ICD-10-CM | POA: Diagnosis not present

## 2018-04-26 DIAGNOSIS — Z789 Other specified health status: Secondary | ICD-10-CM | POA: Diagnosis not present

## 2018-04-26 NOTE — Assessment & Plan Note (Addendum)
Assessment: BMI 44 Severe restriction on pulmonary function testing Sedentary lifestyle  Just starting pulmonary rehab - 1 visit   Plan: Work on healthy weight Keep scheduled follow-up with [facility] Continue forward with nutritional therapy as ordered by [facility] Could consider medical weight management referral in the future

## 2018-04-26 NOTE — Assessment & Plan Note (Signed)
Assessment: Patient takes innumerable amounts of dietary supplements Patient uses cannabis oil Patient uses CBD oil Patient uses herbal sinus rinses twice daily  Plan: Reviewed these herbal supplements with primary care I do not recommend using herbal supplements for management of your health

## 2018-04-26 NOTE — Assessment & Plan Note (Signed)
Assessment: December 2019 DLCO 43% Severe centrilobular emphysema Maintained on 4 L via nasal cannula in office today Patient routinely takes oxygen off  Plan: Wear oxygen as prescribed Do not take oxygen off as you know that you have high oxygen needs and this will cause you to potentially have an exacerbation in your breathing or hypoxia Keep scheduled follow-up with pulmonary rehab Keep scheduled follow-up with [facility] Keep scheduled follow-up with Dr. Valeta Harms

## 2018-04-26 NOTE — Assessment & Plan Note (Addendum)
Assessment: Severe centrilobular emphysema December/2019 pulmonary function test shows an FEV1 of 35%, DLCO 43% BMI 44 Maintained on Symbicort 160 and Spiriva 2.5 Using duo nebs twice daily Lungs clear to auscultation today but very diminished most likely due to body habitus Patient currently in pulmonary rehab has completed 1 visit Patient refuses flu vaccines  Plan: Continue Symbicort 160 Continue Spiriva Respimat 2.5 Declined flu vaccine today Declined chest x-ray today Continue pulmonary rehab Continue DuoNeb nebulized medications as prescribed Continue to follow-up with [facility] for pulmonary management and work-up for potential Zephyr procedure >>> Contact [facility] regarding your need to be rescheduled for a CT of your chest >>> Contact [facility] regarding the fact that you are completing a 6-minute walk at pulmonary rehab to see if this would be sufficient >>> Keep scheduled follow-up with [facility] as this is important further work-up for your potential Zephyr procedure Continue follow-up with Dr. Valeta Harms as scheduled

## 2018-04-26 NOTE — Patient Instructions (Addendum)
Chest Xray today  >>>you declined this today   Continue Spiriva Respimat 2.5 >>> 2 puffs daily >>> Do this every day >>>This is not a rescue inhaler  Continue Symbicort 160 >>> 2 puffs in the morning right when you wake up, rinse out your mouth after use, 12 hours later 2 puffs, rinse after use >>> Take this daily, no matter what >>> This is not a rescue inhaler   Continue with Pulmonary Rehab   Continue oxygen therapy as prescribed  >>>maintain oxygen saturations greater than 88 percent  >>>if unable to maintain oxygen saturations please contact the office  >>>do not smoke with oxygen  >>>can use nasal saline gel or nasal saline rinses to moisturize nose if oxygen causes dryness  Note your daily symptoms > remember "red flags" for COPD:   >>>Increase in cough >>>increase in sputum production >>>increase in shortness of breath or activity  intolerance.   If you notice these symptoms, please call the office to be seen.   Actively work on losing weight and lowering your BMI   Schedule follow up with PCP regarding recent chronic illness flares  Keep follow up with [facility]   Keep follow up with Dr. Valeta Harms as planned   It is flu season:   >>> Best ways to protect herself from the flu: Receive the yearly flu vaccine, practice good hand hygiene washing with soap and also using hand sanitizer when available, eat a nutritious meals, get adequate rest, hydrate appropriately   Please contact the office if your symptoms worsen or you have concerns that you are not improving.   Thank you for choosing [facility] for your healthcare, and for allowing [place] to partner with you on your healthcare journey. I am thankful to be able to provide care to you today.   Wyn Quaker FNP-C

## 2018-04-26 NOTE — Progress Notes (Addendum)
@Patient  ID: Rebecca [name], female    DOB: Sep 12, 1950, 68 y.o.   MRN: [id number]  Chief Complaint  Patient presents with  . Acute Visit    Shortness of breath    Referring provider: Isaac Bliss, [place]*  HPI:  68 year old female former smoker followed in our office for COPD and chronic respiratory failure  PMH: Type 2 diabetes, morbidly obese, bursitis of right elbow, diastolic dysfunction, chronic kidney disease stage III, DO NOT RESUSCITATE, metabolic syndrome, GERD, IBS Smoker/ Smoking History: Former Smoker. Quit 1993.  29-pack-year smoking history. Maintenance: Symbicort 160, Spiriva Respimat 2.5 Pt of: Dr. Valeta Harms, former [facility]   04/26/2018  - Visit   68 year old female former smoker followed in our office for COPD as well as chronic respiratory failure.  Patient presenting to our office today reporting that she has been acutely short of breath since December/2019.  Patient has been managed at [facility] for pulmonary needs for work-up for potential Zephyr procedure.  Patient was scheduled to have a CT completed today at [facility] but she missed that appointment.  Patient reports that nobody from [place] ever notified her that she was scheduled for this.  Patient did complete a recent PFT at [facility] which results are listed below.  Last CT was in 2016 at [facility] which showed severe centrilobular emphysema.  Throughout office visit today patient is taking oxygen off because she reports that this dries out her nose.  Patient reporting that oxygen levels at home sometimes run in the low 80s.  Patient is unable to explain to me whether or not this is if she is on oxygen are off because she takes it on and off at her pleasure.  Patient reports adherence to Symbicort 160 with spacer.  Patient also reports adherence to Spiriva Respimat 2.5.  Patient reports she has not noticed an improvement in her breathing since switching to these inhalers.  Patient uses her DuoNeb nebulized  medications twice daily.  Patient did start pulmonary rehab and is planning on doing a 6-minute walk at pulmonary rehab.  Patient has presented to primary care for follow-up after recent ER visits.  Patient is scheduled for follow-up with primary care and April/2020.  Patient is extremely frustrated regarding her worsening breathing status since a COPD exacerbation in December/2019 as well as repeated chronic illness flares.   Of note, patient takes multiple over-the-counter herbal supplements including herbal sinus rinses.  These are managed by the patient.  Patient reports no concerns with taking herbal supplements.  Patient also takes cannabis oil and CBD oil.  Patient reports that her herbal supplements keep her "healthy".  Patient also routinely uses Vicks VapoRub or menthol inhalants to open up her sinuses.  MMRC - Breathlessness Score 3 - I stop for breath after walking about 100 yards or after a few minutes on level ground (isle at grocery store is 125ft)      Tests:   03/16/2017-chest x-ray- emphysematous and bronchitic changes consistent with COPD  08/21/2014-CT chest without contrast- severe centrilobular emphysema, distal bronchial mucoid plugging is present within the bases along the and associated atelectasis right greater than left  06/05/2014-spirometry- FVC 1.6 (50% predicted), ratio 55, FEV1 0.9 (35% predicted) >>>Severe airway obstruction with low vital capacity  full pulmonary function test that was completed at [facility] this was done February 21, 2018 >>> FEV1 was 35% predicted, RV was 203% predicted DLCO 43% predicted.  FENO:  No results found for: NITRICOXIDE  PFT: No flowsheet data  found.  Imaging: Dg Chest Portable 1 View  Result Date: 04/16/2018 CLINICAL DATA:  Nausea and abdominal pain. Diarrhea and shortness of breath. EXAM: PORTABLE CHEST 1 VIEW COMPARISON:  March 16, 2017 FINDINGS: Emphysematous changes in the upper lobes. The heart, hila, and  mediastinum are unchanged. No pulmonary nodules or masses. Mild atelectasis in the right base. No focal infiltrate. IMPRESSION: Emphysematous changes in the upper lobes with redistribution of blood flow to the bases. Mild atelectasis in the right base. No other interval changes. Electronically Signed   By: Dorise Bullion III M.D   On: 04/16/2018 14:10      Specialty Problems      Pulmonary Problems   COPD mixed type ([place])    03/16/2017-chest x-ray- emphysematous and bronchitic changes consistent with COPD  08/21/2014-CT chest without contrast- severe centrilobular emphysema, distal bronchial mucoid plugging is present within the bases along the and associated atelectasis right greater than left  06/05/2014-spirometry- FVC 1.6 (50% predicted), ratio 55, FEV1 0.9 (35% predicted) >>>Severe airway obstruction with low vital capacity  full pulmonary function test that was completed at [facility] this was done February 21, 2018 >>> FEV1 was 35% predicted, RV was 203% predicted DLCO 43% predicted.  February/2020-currently on pulmonary rehab      Obstructive sleep apnea   Asthma with exacerbation    Qualifier: Diagnosis of  By: Lenna Gilford MD, Deborra Medina       Chronic respiratory failure ([place])      Allergies  Allergen Reactions  . Keflex [Cephalexin] Diarrhea  . Penicillins Nausea Only and Nausea And Vomiting    REACTION: nausea and vomiting REACTION: nausea and vomiting REACTION: nausea and vomiting    There is no immunization history for the selected administration types on file for this patient.  Pt doesn't take flu vaccines   Pt has taken pneumonia vaccines before but unsure   Past Medical History:  Diagnosis Date  . Chest pain   . Chronic venous insufficiency   . COPD (chronic obstructive pulmonary disease) ([place])   . DJD (degenerative joint disease)   . Dysthymia   . Fibromyalgia   . GERD (gastroesophageal reflux disease)   . History of headache   . Hodgkin lymphoma of  extranodal or solid organ site [facility])   . Hypothyroid   . IBS (irritable bowel syndrome)   . Metabolic syndrome X   . Morbid obesity ([place])   . OSA (obstructive sleep apnea)   . Vitamin D deficiency     Tobacco History: Social History   Tobacco Use  Smoking Status Former Smoker  . Packs/day: 1.00  . Years: 29.00  . Pack years: 29.00  . Types: Cigarettes  . Start date: 03/02/1962  . Last attempt to quit: 09/27/1991  . Years since quitting: 26.5  Smokeless Tobacco Never Used   Counseling given: Yes  Continue to not smoke  Outpatient Encounter Medications as of 04/26/2018  Medication Sig  . albuterol (VENTOLIN HFA) 108 (90 Base) MCG/ACT inhaler INHALE 2 PUFFS INTO THE LUNGS EVERY 6 HOURS AS NEEDED FOR WHEEZING OR SHORTNESS OF BREATH (Patient taking differently: 2 puffs. 2 puffs in the morning and 2 puffs at bedtime)  . ALPRAZolam (XANAX) 0.5 MG tablet Take 1 tab three times per day as needed for nerves MAX DAILY DOSE = 3 TABLETS (Patient taking differently: 0.5 mg at bedtime as needed. )  . Apple Cider Vinegar 500 MG TABS Take 1,000 mg by mouth daily.  . Ascorbic Acid (VITAMIN C) 1000 MG  tablet Take 1,000 mg by mouth daily.  . baclofen (LIORESAL) 10 MG tablet Take 10 mg by mouth. Taking 1 nightly  . cetirizine (ZYRTEC) 10 MG tablet Take 10 mg by mouth daily.    . Cholecalciferol (VITAMIN D-3) 5000 UNITS TABS Take 1 tablet by mouth daily. Per pt medication list, takes 1,000  . CINNAMON PO Take 325 mg by mouth every morning. Takes 2 every am  . colchicine (COLCRYS) 0.6 MG tablet Take 1 tablet (0.6 mg total) by mouth daily as needed.  . COLCRYS 0.6 MG tablet TAKE ONE TABLET BY MOUTH DAILY  . cyanocobalamin 2000 MCG tablet Take 2,500 mcg by mouth 2 (two) times daily.  . furosemide (LASIX) 40 MG tablet TAKE 1-2 TABLETS ONCE A DAY AS NEEDED FOR SWELLING (Patient taking differently: 80 mg. TAKE 1 tab daily)  . glucosamine-chondroitin 500-400 MG tablet Take 2 tablets by mouth 2 (two) times  daily.   Marland Kitchen ipratropium-albuterol (DUONEB) 0.5-2.5 (3) MG/3ML SOLN Take 3 mLs by nebulization 3 (three) times daily. (Patient taking differently: Take 3 mLs by nebulization 2 (two) times daily. )  . loperamide (IMODIUM) 2 MG capsule Take 1 capsule (2 mg total) by mouth 4 (four) times daily as needed for diarrhea or loose stools.  . Magnesium 400 MG CAPS Take 3 capsules by mouth 2 (two) times daily.   . Misc Natural Products (TART CHERRY ADVANCED PO) Take by mouth.  . montelukast (SINGULAIR) 10 MG tablet Take 1 tablet (10 mg total) by mouth at bedtime.  . OXYGEN Inhale 4 L into the lungs.  Marland Kitchen Spacer/Aero-Holding Chambers (AEROCHAMBER MV) inhaler Use as instructed  . SYMBICORT 160-4.5 MCG/ACT inhaler INHALE 2 PUFFS INTO THE LUNGS 2 TIMES DAILY (Patient taking differently: Only taking 2 puffs once a day)  . Tiotropium Bromide Monohydrate (SPIRIVA RESPIMAT) 2.5 MCG/ACT AERS Inhale 2 puffs into the lungs daily.  . traMADol (ULTRAM) 50 MG tablet Take 1 tablet (50 mg total) by mouth 3 (three) times daily as needed for moderate pain.  . TURMERIC PO Take 500 mg by mouth 2 (two) times daily. Takes 1,000 mg  . vitamin A 10000 UNIT capsule Take 5,000 Units by mouth daily. Per pt list - takes 25,000  . fluticasone (FLONASE) 50 MCG/ACT nasal spray Place 2 sprays into both nostrils daily as needed.   Marland Kitchen guaiFENesin (MUCINEX) 600 MG 12 hr tablet Take 400 mg by mouth 2 (two) times daily. Tales 3 tab;es twice a day   No facility-administered encounter medications on file as of 04/26/2018.      Review of Systems  Review of Systems  Constitutional: Positive for fatigue. Negative for fever.  HENT: Negative for congestion, postnasal drip, sinus pressure, sneezing and sore throat.   Respiratory: Positive for shortness of breath. Negative for cough and wheezing.   Cardiovascular: Negative for chest pain and palpitations.  Gastrointestinal: Negative for diarrhea, nausea and vomiting.  Psychiatric/Behavioral: Positive  for agitation and decreased concentration. The patient is nervous/anxious.     Physical Exam  BP 118/68 (BP Location: Left Arm, Cuff Size: Large)   Pulse (!) 105   Ht 5\' 3"  (1.6 m)   Wt 248 lb 6.4 oz (112.7 kg)   SpO2 91%   BMI 44.00 kg/m   Wt Readings from Last 5 Encounters:  04/26/18 248 lb 6.4 oz (112.7 kg)  04/25/18 249 lb 9 oz (113.2 kg)  04/19/18 246 lb 4.8 oz (111.7 kg)  04/17/18 245 lb (111.1 kg)  04/16/18 250 lb (113.4 kg)  4L via [place]   Physical Exam  Constitutional: She is oriented to person, place, and time and well-developed, well-nourished, and in no distress. No distress.  HENT:  Head: Normocephalic and atraumatic.  Right Ear: Hearing, tympanic membrane, external ear and ear canal normal.  Left Ear: Hearing, tympanic membrane, external ear and ear canal normal.  Nose: Mucosal edema and rhinorrhea present. Right sinus exhibits no maxillary sinus tenderness and no frontal sinus tenderness. Left sinus exhibits no maxillary sinus tenderness and no frontal sinus tenderness.  Mouth/Throat: Uvula is midline and oropharynx is clear and moist. No oropharyngeal exudate.  Postnasal drip  Eyes: Pupils are equal, round, and reactive to light.  Neck: Normal range of motion. Neck supple.  Cardiovascular: Normal rate, regular rhythm and normal heart sounds.  Distant heart tones  Pulmonary/Chest: Effort normal and breath sounds normal. No accessory muscle usage. No respiratory distress. She has no decreased breath sounds. She has no wheezes. She has no rhonchi.  Distant breath sounds, diminished throughout exam, no wheezing  Abdominal: Soft. Bowel sounds are normal. She exhibits no distension. There is no abdominal tenderness.  Umbilical hernia  Musculoskeletal: Normal range of motion.        General: Edema (1+ lower extremity edema bilaterally) present.  Lymphadenopathy:    She has no cervical adenopathy.  Neurological: She is alert and oriented to person, place, and time.  Gait normal.  Skin: Skin is warm and dry. She is not diaphoretic. There is erythema (Right elbow recovering from septic joint being followed by primary care).  Psychiatric: Memory and judgment normal. Her mood appears anxious. She is agitated. She exhibits a depressed mood.  Distressed regarding chronic health management.  Anxious regarding work-up at [facility].  Frustrated and agitated regarding missing CT appointment today which she attributes to [facility] not contacting her.  Nursing note and vitals reviewed.    Lab Results:  CBC    Component Value Date/Time   WBC 12.8 (H) 04/19/2018 1545   RBC 4.98 04/19/2018 1545   HGB 14.4 04/19/2018 1545   HCT 45.6 04/19/2018 1545   PLT 382.0 04/19/2018 1545   MCV 91.4 04/19/2018 1545   MCH 28.3 04/16/2018 1302   MCHC 31.6 04/19/2018 1545   RDW 15.5 04/19/2018 1545   LYMPHSABS 2.2 04/16/2018 1302   MONOABS 1.1 (H) 04/16/2018 1302   EOSABS 0.1 04/16/2018 1302   BASOSABS 0.1 04/16/2018 1302    BMET    Component Value Date/Time   NA 138 04/16/2018 1400   K 4.2 04/16/2018 1400   CL 105 04/16/2018 1400   CO2 21 (L) 04/16/2018 1400   GLUCOSE 114 (H) 04/16/2018 1400   BUN 21 04/16/2018 1400   CREATININE 1.30 (H) 04/16/2018 1400   CREATININE 1.17 (H) 03/16/2017 1241   CALCIUM 9.4 04/16/2018 1400   GFRNONAA 42 (L) 04/16/2018 1400   GFRNONAA 48 (L) 03/16/2017 1241   GFRAA 48 (L) 04/16/2018 1400   GFRAA 55 (L) 03/16/2017 1241    BNP No results found for: BNP  ProBNP    Component Value Date/Time   PROBNP 51.2 12/04/2009 1024      Assessment & Plan:   68 year old female patient presenting to our office today for an acute visit.  Patient does not have acute symptoms these are chronic symptoms that she has been dealing with for last 2 to 3 months.  Patient is frustrated regarding her work-up at [facility] pulmonary.  She missed an appointment today.  I explained to patient she needs to  keep scheduled follow-ups with [facility] as well  as follow-up to reschedule her CT chest that was scheduled to be completed today.  This is important as they are working patient up for a Zephyr procedure which is only provided at [facility] in [place].  If she still wants to be a candidate for this procedure it will need to be managed by [facility] pulmonary.  I believe the patient also is struggling with aspects of depression due to her chronic health management.  I believe this would be best managed by primary care.  This should be reviewed at next office visit with patient.  COPD mixed type [facility]) Assessment: Severe centrilobular emphysema December/2019 pulmonary function test shows an FEV1 of 35%, DLCO 43% BMI 44 Maintained on Symbicort 160 and Spiriva 2.5 Using duo nebs twice daily Lungs clear to auscultation today but very diminished most likely due to body habitus Patient currently in pulmonary rehab has completed 1 visit Patient refuses flu vaccines  Plan: Continue Symbicort 160 Continue Spiriva Respimat 2.5 Declined flu vaccine today Declined chest x-ray today Continue pulmonary rehab Continue DuoNeb nebulized medications as prescribed Continue to follow-up with [facility] for pulmonary management and work-up for potential Zephyr procedure >>> Contact [facility] regarding your need to be rescheduled for a CT of your chest >>> Contact [facility] regarding the fact that you are completing a 6-minute walk at pulmonary rehab to see if this would be sufficient >>> Keep scheduled follow-up with [facility] as this is important further work-up for your potential Zephyr procedure Continue follow-up with Dr. Valeta Harms as scheduled   Chronic respiratory failure [facility]) Assessment: December 2019 DLCO 43% Severe centrilobular emphysema Maintained on 4 L via nasal cannula in office today Patient routinely takes oxygen off  Plan: Wear oxygen as prescribed Do not take oxygen off as you know that you have high oxygen needs and this will cause you to  potentially have an exacerbation in your breathing or hypoxia Keep scheduled follow-up with pulmonary rehab Keep scheduled follow-up with [facility] Keep scheduled follow-up with Dr. Valeta Harms  Morbid obesity Assessment: BMI 44 Severe restriction on pulmonary function testing Sedentary lifestyle  Just starting pulmonary rehab - 1 visit   Plan: Work on healthy weight Keep scheduled follow-up with [facility] Continue forward with nutritional therapy as ordered by [facility] pulmonary Could consider medical weight management referral in the future  Takes dietary supplements Assessment: Patient takes innumerable amounts of dietary supplements Patient uses cannabis oil Patient uses CBD oil Patient uses herbal sinus rinses twice daily  Plan: Reviewed these herbal supplements with primary care I do not recommend using herbal supplements for management of your health      Lauraine Rinne, NP 04/26/2018   This appointment was 52 min long with over 50% of the time in direct face-to-face patient care, assessment, plan of care, and follow-up.

## 2018-04-27 NOTE — Progress Notes (Signed)
PCCM: Agree. Thanks for seeing her. F/u scheduled with me. [facility] 04/27/2018 4:47 PM

## 2018-04-29 ENCOUNTER — Telehealth (HOSPITAL_COMMUNITY): Payer: Self-pay | Admitting: *Deleted

## 2018-04-29 NOTE — Telephone Encounter (Signed)
Pt agreeable to changing her exercise time to 3:00pm on May 02, 2018. Cherre Huger, BSN Cardiac and Training and development officer

## 2018-04-29 NOTE — Progress Notes (Signed)
Rebecca [name] 68 y.o. female   DOB: Sep 03, 1950 MRN: [id number]          Nutrition 1. 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 Chest pain   . Chronic venous insufficiency   . COPD (chronic obstructive pulmonary disease) ([place])   . DJD (degenerative joint disease)   . Dysthymia   . Fibromyalgia   . GERD (gastroesophageal reflux disease)   . History of headache   . Hodgkin lymphoma of extranodal or solid organ site [facility])   . Hypothyroid   . IBS (irritable bowel syndrome)   . Metabolic syndrome X   . Morbid obesity ([place])   . OSA (obstructive sleep apnea)   . Vitamin D deficiency\ Type 2 DM      Meds reviewed.   Current Outpatient Medications (Cardiovascular):  .  furosemide (LASIX) 40 MG tablet, TAKE 1-2 TABLETS ONCE A DAY AS NEEDED FOR SWELLING (Patient taking differently: 80 mg. TAKE 1 tab daily)  Current Outpatient Medications (Respiratory):  .  albuterol (VENTOLIN HFA) 108 (90 Base) MCG/ACT inhaler, INHALE 2 PUFFS INTO THE LUNGS EVERY 6 HOURS AS NEEDED FOR WHEEZING OR SHORTNESS OF BREATH (Patient taking differently: 2 puffs. 2 puffs in the morning and 2 puffs at bedtime) .  cetirizine (ZYRTEC) 10 MG tablet, Take 10 mg by mouth daily.   Marland Kitchen  guaiFENesin (MUCINEX) 600 MG 12 hr tablet, Take 400 mg by mouth 2 (two) times daily. Tales 3 tab;es twice a day .  ipratropium-albuterol (DUONEB) 0.5-2.5 (3) MG/3ML SOLN, Take 3 mLs by nebulization 3 (three) times daily. (Patient taking differently: Take 3 mLs by nebulization 2 (two) times daily. ) .  montelukast (SINGULAIR) 10 MG tablet, Take 1 tablet (10 mg total) by mouth at bedtime. .  SYMBICORT 160-4.5 MCG/ACT inhaler, INHALE 2 PUFFS INTO THE LUNGS 2 TIMES DAILY (Patient taking differently: Only taking 2 puffs once a day) .  Tiotropium Bromide Monohydrate (SPIRIVA RESPIMAT) 2.5 MCG/ACT AERS, Inhale 2 puffs into the lungs daily. .  fluticasone (FLONASE) 50 MCG/ACT nasal  spray, Place 2 sprays into both nostrils daily as needed.   Current Outpatient Medications (Analgesics):  Marland Kitchen  COLCRYS 0.6 MG tablet, TAKE ONE TABLET BY MOUTH DAILY .  traMADol (ULTRAM) 50 MG tablet, Take 1 tablet (50 mg total) by mouth 3 (three) times daily as needed for moderate pain. Marland Kitchen  colchicine (COLCRYS) 0.6 MG tablet, Take 1 tablet (0.6 mg total) by mouth daily as needed.  Current Outpatient Medications (Hematological):  .  cyanocobalamin 2000 MCG tablet, Take 2,500 mcg by mouth 2 (two) times daily.  Current Outpatient Medications (Other):  Marland Kitchen  ALPRAZolam (XANAX) 0.5 MG tablet, Take 1 tab three times per day as needed for nerves MAX DAILY DOSE = 3 TABLETS (Patient taking differently: 0.5 mg at bedtime as needed. ) .  Apple Cider Vinegar 500 MG TABS, Take 1,000 mg by mouth daily. .  Ascorbic Acid (VITAMIN C) 1000 MG tablet, Take 1,000 mg by mouth daily. .  baclofen (LIORESAL) 10 MG tablet, Take 10 mg by mouth. Taking 1 nightly .  Cholecalciferol (VITAMIN D-3) 5000 UNITS TABS, Take 1 tablet by mouth daily. Per pt medication list, takes 1,000 .  CINNAMON PO, Take 325 mg by mouth every morning. Takes 2 every am .  glucosamine-chondroitin 500-400 MG tablet, Take 2 tablets by mouth 2 (two) times daily.  .  Magnesium 400 MG CAPS, Take 3 capsules by mouth  2 (two) times daily.  .  Misc Natural Products (TART CHERRY ADVANCED PO), Take by mouth. .  OXYGEN, Inhale 4 L into the lungs. Marland Kitchen  Spacer/Aero-Holding Chambers (AEROCHAMBER MV) inhaler, Use as instructed .  TURMERIC PO, Take 500 mg by mouth 2 (two) times daily. Takes 1,000 mg .  vitamin A 10000 UNIT capsule, Take 5,000 Units by mouth daily. Per pt list - takes 25,000 .  loperamide (IMODIUM) 2 MG capsule, Take 1 capsule (2 mg total) by mouth 4 (four) times daily as needed for diarrhea or loose stools.  Ht: Ht Readings from Last 1 Encounters:  04/26/18 5\' 3"  (1.6 m)     Wt:  Wt Readings from Last 3 Encounters:  04/26/18 248 lb 6.4 oz  (112.7 kg)  04/25/18 249 lb 9 oz (113.2 kg)  04/19/18 246 lb 4.8 oz (111.7 kg)     BMI: Body mass index is 44.21 kg/m.     Current tobacco use? No  Labs:  Lipid Panel     Component Value Date/Time   CHOL 168 05/05/2012 1248   TRIG 113.0 05/05/2012 1248   HDL 26.20 (L) 05/05/2012 1248   CHOLHDL 6 05/05/2012 1248   VLDL 22.6 05/05/2012 1248   LDLCALC 119 (H) 05/05/2012 1248    Lab Results  Component Value Date   HGBA1C 6.2 03/16/2017     Nutrition Diagnosis ? Food-and nutrition-related knowledge deficit related to lack of exposure to information as related to diagnosis of pulmonary disease ? Overweight/obesity related to excessive energy intake as evidenced by a BMI of 44.21  Goal(s) 1. Identify food quantities necessary to achieve wt loss of  -2# per week to a goal wt loss of 6-24 lb at graduation from pulmonary rehab. 2. Pt to build a healthy plate including fruits, vegetables, whole grains, and low-fat dairy products in a healthy meal plan.  Plan:  Pt to attend Pulmonary Nutrition class Will provide client-centered nutrition education as part of interdisciplinary care.    Monitor and Evaluate progress toward nutrition goal with team.   Laurina Bustle, MS, RD, LDN 04/29/2018 4:02 PM

## 2018-05-03 ENCOUNTER — Ambulatory Visit (HOSPITAL_COMMUNITY): Payer: Self-pay

## 2018-05-03 ENCOUNTER — Telehealth (HOSPITAL_COMMUNITY): Payer: Self-pay | Admitting: Internal Medicine

## 2018-05-04 ENCOUNTER — Other Ambulatory Visit: Payer: Self-pay | Admitting: Internal Medicine

## 2018-05-04 ENCOUNTER — Ambulatory Visit: Payer: Self-pay

## 2018-05-04 DIAGNOSIS — B372 Candidiasis of skin and nail: Secondary | ICD-10-CM

## 2018-05-04 MED ORDER — NYSTATIN 100000 UNIT/GM EX POWD: Freq: Four times a day (QID) | CUTANEOUS | 0 refills | Status: DC

## 2018-05-04 NOTE — Telephone Encounter (Signed)
Left message on machine for patient to return our call CRM 

## 2018-05-04 NOTE — Telephone Encounter (Signed)
Pt called c/o 2 issues: Pt c/o red moist burning rash under right breast only. Pt stated that she has tried many creams but none have helped. Pt stated that the area is so moist because she wears 3 shirts and and jacket and she sweats under her breasts. Pt stated she has no issues with the left breast due to a scar from 10 years ago (exploratory Laparotomy). Pt stated she has never used an antifungal powder. Pt stated she has had this rash for 20 years. Pt doesn't wear a bra due to her COPD.  Pt also c/o new onset bilateral ankle edema with the left ankle larger than the right. Pt stated that she has been eating processed mashed potatoes and drinking a lot of water. Pt stated she is having very mild pitting edema. Denies calf pain. Pt with COPD but denies any exacerbation. Problem started 1 week ago.  Care advice given for both issues. Pt verbalized understanding but refused an appointment. Pt advised that her provider may not order anything without an appointment. Pt stated she did not have her calendar with her right now, but if she needs to come she would be willing. Routing note to provider.  Reason for Disposition . Localized rash present > 7 days . Swollen ankle joint  (Exception: area of localized swelling which is itchy)  Answer Assessment - Initial Assessment Questions 1. APPEARANCE of RASH: "Describe the rash."      Red, moist, painful burning feeling 2. LOCATION: "Where is the rash located?"      Under right breast 3. NUMBER: "How many spots are there?"      one 4. SIZE: "How big are the spots?" (Inches, centimeters or compare to size of a coin)      Size of foot ball 5. ONSET: "When did the rash start?"      20 years 6. ITCHING: "Does the rash itch?" If so, ask: "How bad is the itch?"  (Scale 1-10; or mild, moderate, severe)     no 7. PAIN: "Does the rash hurt?" If so, ask: "How bad is the pain?"  (Scale 1-10; or mild, moderate, severe)     Burning pain 8. OTHER SYMPTOMS: "Do you  have any other symptoms?" (e.g., fever)     no 9. PREGNANCY: "Is there any chance you are pregnant?" "When was your last menstrual period?"     n/a  Answer Assessment - Initial Assessment Questions 1. LOCATION: "Which joint is swollen?"     Bilateral ankles left larger than the right 2. ONSET: "When did the swelling start?"     1 week ago 3. SIZE: "How large is the swelling?"     Around the ankles 4. PAIN: "Is there any pain?" If so, ask: "How bad is it?" (Scale 1-10; or mild, moderate, severe)     no 5. CAUSE: "What do you think caused the swollen joint?"    Pt doesn't know maybe too much salt 6. OTHER SYMPTOMS: "Do you have any other symptoms?" (e.g., fever, chest pain, difficulty breathing, calf pain)     COPD 7. PREGNANCY: "Is there any chance you are pregnant?" "When was your last menstrual period?"     n/a  Protocols used: RASH OR REDNESS - [place], ANKLE SWELLING-A-AH

## 2018-05-04 NOTE — Telephone Encounter (Signed)
She should come in to see me about the ankle edema. I can give her some nystatin powder for what sounds like a yeast infection under her breast.

## 2018-05-06 DIAGNOSIS — M6080 Other myositis, unspecified site: Secondary | ICD-10-CM | POA: Diagnosis not present

## 2018-05-06 DIAGNOSIS — J449 Chronic obstructive pulmonary disease, unspecified: Secondary | ICD-10-CM | POA: Diagnosis not present

## 2018-05-06 DIAGNOSIS — J441 Chronic obstructive pulmonary disease with (acute) exacerbation: Secondary | ICD-10-CM | POA: Diagnosis not present

## 2018-05-07 ENCOUNTER — Other Ambulatory Visit: Payer: Self-pay | Admitting: Pulmonary Disease

## 2018-05-10 ENCOUNTER — Ambulatory Visit (HOSPITAL_COMMUNITY): Payer: Self-pay

## 2018-05-10 ENCOUNTER — Encounter (HOSPITAL_COMMUNITY)
Admission: RE | Admit: 2018-05-10 | Discharge: 2018-05-10 | Disposition: A | Discharge status: Home / Self Care | Payer: Medicare HMO | Source: Ambulatory Visit | Attending: Pulmonary Disease | Admitting: Pulmonary Disease

## 2018-05-10 DIAGNOSIS — J449 Chronic obstructive pulmonary disease, unspecified: Secondary | ICD-10-CM | POA: Insufficient documentation

## 2018-05-10 MED ORDER — ALBUTEROL SULFATE HFA 108 (90 BASE) MCG/ACT IN AERS: INHALATION_SPRAY | RESPIRATORY_TRACT | 4 refills | Status: DC

## 2018-05-10 NOTE — Telephone Encounter (Signed)
Spoke with patient and she "used diaper rash cream for rash and it worked".  She is "feeling great".  Patient requested a refill of Albuterol.  Rx sent to the pharmacy.

## 2018-05-10 NOTE — Addendum Note (Signed)
Addended by: Westley Hummer B on: 05/10/2018 03:07 PM   Modules accepted: Orders

## 2018-05-12 ENCOUNTER — Ambulatory Visit (HOSPITAL_COMMUNITY): Payer: Self-pay

## 2018-05-12 ENCOUNTER — Telehealth: Payer: Self-pay | Admitting: Pulmonary Disease

## 2018-05-12 NOTE — Telephone Encounter (Signed)
Patient is returning phone call.  May leave a detailed message on voicemail.  Patient phone number is [phone].

## 2018-05-12 NOTE — Telephone Encounter (Signed)
Called patient unable to reach LMTCB 

## 2018-05-12 NOTE — Telephone Encounter (Signed)
Called and spoke with pt who stated she has received a bill of $134 for valve oxygen flutter acapella. Pt stated at last OV, she had received an aerochamber/spacer at that visit and is wondering why she is receiving a bill for a valve oxygen flutter acapella.  Looking at pt's med list to see what was ordered and I see that the spacer/aerochamber for the inhaler is what was ordered (and that is what pt stated was the correct thing that she got).   Went to check the price on the sheet in Lib[facility]'s office for both the flutter valve that pt is saying is on the bill and also for the aerochamber and neither of these prices add up for what pt has received a bill for as the flutter valve is $81 and the aerochamber is $31.  Per [place], send a message to [facility] for her to follow up on this.  Have sent Darlina Guys with [employer] a community message for her to further look into this. Will await a response from [facility] [address]

## 2018-05-12 NOTE — Progress Notes (Signed)
Pulmonary Individual Treatment Plan  Patient Details  Name: Rebecca [name] MRN: [id number] Date of Birth: 1950-04-27 Referring Provider:     Pulmonary Rehab Walk Test from 05/10/2018 in [place]  Referring Provider  Dr. 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  albuterol (VENTOLIN HFA) 108 (90 Base) MCG/ACT inhaler, INHALE 2 PUFFS INTO THE LUNGS EVERY 6 HOURS AS NEEDED FOR WHEEZING OR SHORTNESS OF BREATH, Disp: 54 each, Rfl: 4 .  ALPRAZolam (XANAX) 0.5 MG tablet, Take 1 tab three times per day as needed for nerves MAX DAILY DOSE = 3 TABLETS (Patient taking differently: 0.5 mg at bedtime as needed. ), Disp: 270 tablet, Rfl: 1 .  Apple Cider Vinegar 500 MG TABS, Take 1,000 mg by mouth daily., Disp: , Rfl:  .  Ascorbic Acid (VITAMIN C) 1000 MG tablet, Take 1,000 mg by mouth daily., Disp: , Rfl:  .  baclofen (LIORESAL) 10 MG tablet, Take 10 mg by mouth. Taking 1 nightly, Disp: , Rfl:  .  cetirizine (ZYRTEC) 10 MG tablet, Take 10 mg by mouth daily.  , Disp: , Rfl:  .  Cholecalciferol (VITAMIN D-3) 5000 UNITS TABS, Take 1 tablet by mouth daily. Per pt medication list, takes 1,000, Disp: , Rfl:  .  CINNAMON PO, Take 325 mg by mouth every morning. Takes 2 every am, Disp: , Rfl:  .  colchicine (COLCRYS) 0.6 MG tablet, Take 1 tablet (0.6 mg total) by mouth daily as needed., Disp: 30 tablet, Rfl: 3 .  COLCRYS 0.6 MG tablet, TAKE ONE TABLET BY MOUTH DAILY, Disp: 30 tablet, Rfl: 0 .  cyanocobalamin 2000 MCG tablet, Take 2,500 mcg by mouth 2 (two) times daily., Disp: , Rfl:  .  fluticasone (FLONASE) 50 MCG/ACT nasal spray, Place 2 sprays into both nostrils daily as needed. , Disp: , Rfl:  .  furosemide  (LASIX) 40 MG tablet, TAKE 1-2 TABLETS ONCE A DAY AS NEEDED FOR SWELLING (Patient taking differently: 80 mg. TAKE 1 tab daily), Disp: 60 tablet, Rfl: 11 .  glucosamine-chondroitin 500-400 MG tablet, Take 2 tablets by mouth 2 (two) times daily. , Disp: , Rfl:  .  guaiFENesin (MUCINEX) 600 MG 12 hr tablet, Take 400 mg by mouth 2 (two) times daily. Tales 3 tab;es twice a day, Disp: , Rfl:  .  ipratropium-albuterol (DUONEB) 0.5-2.5 (3) MG/3ML SOLN, Take 3 mLs by nebulization 3 (three) times daily. (Patient taking differently: Take 3 mLs by nebulization 2 (two) times daily. ), Disp: 270 mL, Rfl: 7 .  loperamide (IMODIUM) 2 MG capsule, Take 1 capsule (2 mg total) by mouth 4 (four) times daily as needed for diarrhea or loose stools., Disp: 12 capsule, Rfl: 0 .  Magnesium 400 MG CAPS, Take 3 capsules by mouth 2 (two) times daily. , Disp: , Rfl:  .  Misc Natural Products (TART CHERRY ADVANCED PO), Take by mouth., Disp: , Rfl:  .  montelukast (SINGULAIR) 10 MG tablet, Take 1 tablet (10 mg total) by mouth at bedtime., Disp: 30 tablet, Rfl: 6 .  nystatin (MYCOSTATIN/NYSTOP) powder, Apply topically 4 (four) times daily., Disp: 15 g, Rfl: 0 .  OXYGEN, Inhale 4 L into the lungs., Disp: , Rfl:  .  Spacer/Aero-Holding Chambers (AEROCHAMBER MV) inhaler, Use as instructed, Disp: 1 each, Rfl: 0 .  SYMBICORT 160-4.5 MCG/ACT inhaler, INHALE 2 PUFFS INTO THE LUNGS 2 TIMES DAILY (Patient taking differently: Only taking 2 puffs once a day), Disp: 1 Inhaler, Rfl: 5 .  Tiotropium Bromide Monohydrate (SPIRIVA RESPIMAT) 2.5 MCG/ACT AERS, Inhale 2 puffs into the lungs daily., Disp: 1 Inhaler, Rfl: 0 .  traMADol (ULTRAM) 50 MG tablet, Take 1 tablet (50 mg total) by mouth 3 (three) times daily as needed for moderate pain., Disp: 90 tablet, Rfl: 3 .  TURMERIC PO, Take 500 mg by mouth 2 (two) times daily. Takes 1,000 mg, Disp: , Rfl:  .  vitamin A 10000 UNIT capsule, Take 5,000 Units by mouth daily. Per pt list - takes 25,000, Disp: ,  Rfl:   Past Medical History: Past Medical History:  Diagnosis Date  . Chest pain   . Chronic venous insufficiency   . COPD (chronic obstructive pulmonary disease) ([place])   . DJD (degenerative joint disease)   . Dysthymia   . Fibromyalgia   . GERD (gastroesophageal reflux disease)   . History of headache   . Hodgkin lymphoma of extranodal or solid organ site [facility])   . Hypothyroid   . IBS (irritable bowel syndrome)   . Metabolic syndrome X   . Morbid obesity ([place])   . OSA (obstructive sleep apnea)   . Vitamin D deficiency     Tobacco Use: Social History   Tobacco Use  Smoking Status Former Smoker  . Packs/day: 1.00  . Years: 29.00  . Pack years: 29.00  . Types: Cigarettes  . Start date: 03/02/1962  . Last attempt to quit: 09/27/1991  . Years since quitting: 26.6  Smokeless Tobacco Never Used    Labs: Recent Chemical engineer    Labs for ITP Cardiac and Pulmonary Rehab Latest Ref Rng & Units 05/05/2012 05/09/2012 05/05/2013 11/17/2013 03/16/2017   Cholestrol 0 - 200 mg/dL 168 - - - -   LDLCALC 0 - 99 mg/dL 119(H) - - - -   HDL >39.00 mg/dL 26.20(L) - - - -   Trlycerides 0.0 - 149.0 mg/dL 113.0 - - - -   Hemoglobin A1c 4.6 - 6.5 % - 6.8(H) 6.8(H) 6.7(H) 6.2      Capillary Blood Glucose: No results found for: GLUCAP   Pulmonary Assessment Scores: Pulmonary Assessment Scores    Row Name 04/25/18 1536 04/25/18 1640 05/12/18 1558     ADL UCSD   ADL Phase  Entry  -  Entry   SOB Score total  99  89  -     CAT Score   CAT Score  17  -  -     mMRC Score   mMRC Score  -  -  3      Pulmonary Function Assessment:   Exercise Target Goals: Exercise Program Goal: Individual exercise prescription set using results from initial 6 min walk test and THRR while considering  patient's activity barriers and safety.   Exercise Prescription Goal: Initial exercise prescription builds to 30-45 minutes a day of aerobic activity, 2-3 days per week.  Home exercise guidelines will  be given to patient during program as part of exercise prescription that the participant will acknowledge.  Activity Barriers & Risk Stratification: Activity Barriers & Cardiac Risk Stratification - 04/25/18 1447      Activity Barriers & Cardiac Risk Stratification   Activity Barriers  Shortness of Breath;Deconditioning;Arthritis   bilateral knees   Cardiac  Risk Stratification  Low       6 Minute Walk: 6 Minute Walk    Row Name 05/12/18 1413         6 Minute Walk   Phase  Initial     Distance  600 feet     Walk Time  - 4 minutes 50 seconds     # of Rest Breaks  1 1 minute 10 seconds     MPH  1.13     METS  2     RPE  12     Perceived Dyspnea   3     Symptoms  Yes (comment)     Comments  used wheelchair     Resting HR  99 bpm     Resting BP  126/70     Resting Oxygen Saturation   91 %     Exercise Oxygen Saturation  during 6 min walk  87 %     Max Ex. HR  113 bpm     Max Ex. BP  140/68       Interval HR   1 Minute HR  104     2 Minute HR  110     3 Minute HR  111     4 Minute HR  113     5 Minute HR  108     6 Minute HR  111     2 Minute Post HR  97     Interval Heart Rate?  Yes       Interval Oxygen   Interval Oxygen?  Yes     Baseline Oxygen Saturation %  91 %     1 Minute Oxygen Saturation %  89 %     1 Minute Liters of Oxygen  4 L     2 Minute Oxygen Saturation %  88 %     2 Minute Liters of Oxygen  4 L     3 Minute Oxygen Saturation %  87 %     3 Minute Liters of Oxygen  4 L     4 Minute Oxygen Saturation %  89 %     4 Minute Liters of Oxygen  6 L     5 Minute Oxygen Saturation %  89 %     5 Minute Liters of Oxygen  6 L     6 Minute Oxygen Saturation %  93 %     6 Minute Liters of Oxygen  6 L     2 Minute Post Oxygen Saturation %  96 %     2 Minute Post Liters of Oxygen  6 L        Oxygen Initial Assessment: Oxygen Initial Assessment - 05/12/18 1558      Initial 6 min Walk   Oxygen Used  Continuous;E-Tanks    Liters per minute  6       Program Oxygen Prescription   Program Oxygen Prescription  Continuous;E-Tanks    Liters per minute  6       Oxygen Re-Evaluation:   Oxygen Discharge (Final Oxygen Re-Evaluation):   Initial Exercise Prescription: Initial Exercise Prescription - 05/12/18 1400      Date of Initial Exercise RX and Referring Provider   Date  05/12/18    Referring Provider  Dr. Valeta Harms      Oxygen   Oxygen  Continuous    Liters  6      NuStep   Level  2  SPM  80    Minutes  17    METs  1.5      Track   Laps  3    Minutes  17      Prescription Details   Frequency (times per week)  2    Duration  Progress to 45 minutes of aerobic exercise without signs/symptoms of physical distress      Intensity   THRR 40-80% of Max Heartrate  60-120    Ratings of Perceived Exertion  11-13    Perceived Dyspnea  0-4      Progression   Progression  Continue to progress workloads to maintain intensity without signs/symptoms of physical distress.      Resistance Training   Training Prescription  Yes    Weight  orange bands    Reps  10-15       Perform Capillary Blood Glucose checks as needed.  Exercise Prescription Changes:   Exercise Comments:   Exercise Goals and Review: Exercise Goals    Row Name 04/25/18 1443             Exercise Goals   Increase Physical Activity  Yes       Intervention  Provide advice, education, support and counseling about physical activity/exercise needs.;Develop an individualized exercise prescription for aerobic and resistive training based on initial evaluation findings, risk stratification, comorbidities and participant's personal goals.       Expected Outcomes  Short Term: Attend rehab on a regular basis to increase amount of physical activity.;Long Term: Add in home exercise to make exercise part of routine and to increase amount of physical activity.;Long Term: Exercising regularly at least 3-5 days a week.       Increase Strength and Stamina  Yes        Intervention  Provide advice, education, support and counseling about physical activity/exercise needs.;Develop an individualized exercise prescription for aerobic and resistive training based on initial evaluation findings, risk stratification, comorbidities and participant's personal goals.       Expected Outcomes  Short Term: Increase workloads from initial exercise prescription for resistance, speed, and METs.;Short Term: Perform resistance training exercises routinely during rehab and add in resistance training at home;Long Term: Improve cardiorespiratory fitness, muscular endurance and strength as measured by increased METs and functional capacity (6MWT)       Able to understand and use rate of perceived exertion (RPE) scale  Yes       Intervention  Provide education and explanation on how to use RPE scale       Expected Outcomes  Short Term: Able to use RPE daily in rehab to express subjective intensity level;Long Term:  Able to use RPE to guide intensity level when exercising independently       Able to understand and use Dyspnea scale  Yes       Intervention  Provide education and explanation on how to use Dyspnea scale       Expected Outcomes  Short Term: Able to use Dyspnea scale daily in rehab to express subjective sense of shortness of breath during exertion;Long Term: Able to use Dyspnea scale to guide intensity level when exercising independently       Knowledge and understanding of Target Heart Rate Range (THRR)  Yes       Intervention  Provide education and explanation of THRR including how the numbers were predicted and where they are located for reference       Expected Outcomes  Short Term: Able to  state/look up THRR;Short Term: Able to use daily as guideline for intensity in rehab;Long Term: Able to use THRR to govern intensity when exercising independently       Able to check pulse independently  Yes       Intervention  Provide education and demonstration on how to check pulse in  carotid and radial arteries.;Review the importance of being able to check your own pulse for safety during independent exercise       Expected Outcomes  Short Term: Able to explain why pulse checking is important during independent exercise;Long Term: Able to check pulse independently and accurately       Understanding of Exercise Prescription  Yes       Intervention  Provide education, explanation, and written materials on patient's individual exercise prescription       Expected Outcomes  Short Term: Able to explain program exercise prescription;Long Term: Able to explain home exercise prescription to exercise independently          Exercise Goals Re-Evaluation :   Discharge Exercise Prescription (Final Exercise Prescription Changes):   Nutrition:  Target Goals: Understanding of nutrition guidelines, daily intake of sodium <1547m, cholesterol <2062m calories 30% from fat and 7% or less from saturated fats, daily to have 5 or more servings of fruits and vegetables.  Biometrics:    Nutrition Therapy Plan and Nutrition Goals: Nutrition Therapy & Goals - 04/29/18 1613      Nutrition Therapy   Diet  carb modified      Personal Nutrition Goals   Nutrition Goal  pt to identify food quantities necessary to achieve weight loss of 1/2 -2 lbs per week to a goal weight loss of 6-24 lbs at graduation from pulmonary rehab    Personal Goal #2  pt to build a healthy plate including fruits, vegetables, whole grains, and low-fat dairy products in a healthy meal plan      Intervention Plan   Intervention  Prescribe, educate and counsel regarding individualized specific dietary modifications aiming towards targeted core components such as weight, hypertension, lipid management, diabetes, heart failure and other comorbidities.    Expected Outcomes  Short Term Goal: Understand basic principles of dietary content, such as calories, fat, sodium, cholesterol and nutrients.;Long Term Goal: Adherence to  prescribed nutrition plan.       Nutrition Assessments: Nutrition Assessments - 04/29/18 1633      Rate Your Plate Scores   Pre Score  41       Nutrition Goals Re-Evaluation:   Nutrition Goals Discharge (Final Nutrition Goals Re-Evaluation):   Psychosocial: Target Goals: Acknowledge presence or absence of significant depression and/or stress, maximize coping skills, provide positive support system. Participant is able to verbalize types and ability to use techniques and skills needed for reducing stress and depression.  Initial Review & Psychosocial Screening: Initial Psych Review & Screening - 04/25/18 1640      Initial Review   Current issues with  Current Stress Concerns    Source of Stress Concerns  Chronic Illness;Unable to perform yard/household activities;Financial;Unable to participate in former interests or hobbies    Comments  over the last year pt has steady decline in her health.  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  Has 2 roommates in order to make ends meet.  although estanged from family and daugter this is not a stressor for her      Family Dynamics   Comments  no relationship with any family members.  Has a sister who will speak  to her on occasion.  Talks to brother whom she leases a home from about house realted issues and rent. Gave up daughter for adoption.  Found daughter - met but later became estranged. Significant other passed away 3 years ago.  Has 2 roommates who live with her no interaction with them       Quality of Life Scores:  Scores of 19 and below usually indicate a poorer quality of life in these areas.  A difference of  2-3 points is a clinically meaningful difference.  A difference of 2-3 points in the total score of the Quality of Life Index has been associated with significant improvement in overall quality of life, self-image, physical symptoms, and general health in studies assessing change in quality of life.  PHQ-9: Recent Review Flowsheet  Data    Depression screen [facility] 2/9 04/25/2018 03/08/2018 08/10/2011   Decreased Interest 0 0 1   Down, Depressed, Hopeless 2 0 1   PHQ - 2 Score 2 0 2   Altered sleeping 1 0 -   Tired, decreased energy 1 0 -   Change in appetite 0 0 -   Feeling bad or failure about yourself  3 0 -   Trouble concentrating 0 0 -   Moving slowly or fidgety/restless 0 0 -   Suicidal thoughts 0 0 -   PHQ-9 Score 7 0 -   Difficult doing work/chores Very difficult  - -     Interpretation of Total Score  Total Score Depression Severity:  1-4 = Minimal depression, 5-9 = Mild depression, 10-14 = Moderate depression, 15-19 = Moderately severe depression, 20-27 = Severe depression   Psychosocial Evaluation and Intervention:   Psychosocial Re-Evaluation:   Psychosocial Discharge (Final Psychosocial Re-Evaluation):   Education: Education Goals: Education classes will be provided on a weekly basis, covering required topics. Participant will state understanding/return demonstration of topics presented.  Learning Barriers/Preferences: Learning Barriers/Preferences - 04/25/18 1514      Learning Barriers/Preferences   Learning Preferences  Group Instruction;Individual Instruction;Pictoral;Audio;Video;Verbal Instruction       Education Topics: Risk Factor Reduction:  -Group instruction that is supported by a PowerPoint presentation. Instructor discusses the definition of a risk factor, different risk factors for pulmonary disease, and how the heart and lungs work together.     Nutrition for Pulmonary Patient:  -Group instruction provided by PowerPoint slides, verbal discussion, and written materials to support subject matter. The instructor gives an explanation and review of healthy diet recommendations, which includes a discussion on weight management, recommendations for fruit and vegetable consumption, as well as protein, fluid, caffeine, fiber, sodium, sugar, and alcohol. Tips for eating when patients are  short of breath are discussed.   Pursed Lip Breathing:  -Group instruction that is supported by demonstration and informational handouts. Instructor discusses the benefits of pursed lip and diaphragmatic breathing and detailed demonstration on how to preform both.     Oxygen Safety:  -Group instruction provided by PowerPoint, verbal discussion, and written material to support subject matter. There is an overview of "What is Oxygen" and "Why do we need it".  Instructor also reviews how to create a safe environment for oxygen use, the importance of using oxygen as prescribed, and the risks of noncompliance. There is a brief discussion on traveling with oxygen and resources the patient may utilize.   Oxygen Equipment:  -Group instruction provided by [facility] Staff utilizing handouts, written materials, and equipment demonstrations.   Signs and Symptoms:  -Group instruction provided  by written material and verbal discussion to support subject matter. Warning signs and symptoms of infection, stroke, and heart attack are reviewed and when to call the physician/911 reinforced. Tips for preventing the spread of infection discussed.   Advanced Directives:  -Group instruction provided by verbal instruction and written material to support subject matter. Instructor reviews Advanced Directive laws and proper instruction for filling out document.   Pulmonary Video:  -Group video education that reviews the importance of medication and oxygen compliance, exercise, good nutrition, pulmonary hygiene, and pursed lip and diaphragmatic breathing for the pulmonary patient.   Exercise for the Pulmonary Patient:  -Group instruction that is supported by a PowerPoint presentation. Instructor discusses benefits of exercise, core components of exercise, frequency, duration, and intensity of an exercise routine, importance of utilizing pulse oximetry during exercise, safety while exercising, and options of places  to exercise outside of rehab.     Pulmonary Medications:  -Verbally interactive group education provided by instructor with focus on inhaled medications and proper administration.   Anatomy and Physiology of the Respiratory System and Intimacy:  -Group instruction provided by PowerPoint, verbal discussion, and written material to support subject matter. Instructor reviews respiratory cycle and anatomical components of the respiratory system and their functions. Instructor also reviews differences in obstructive and restrictive respiratory diseases with examples of each. Intimacy, Sex, and Sexuality differences are reviewed with a discussion on how relationships can change when diagnosed with pulmonary disease. Common sexual concerns are reviewed.   MD DAY -A group question and answer session with a medical doctor that allows participants to ask questions that relate to their pulmonary disease state.   OTHER EDUCATION -Group or individual verbal, written, or video instructions that support the educational goals of the pulmonary rehab program.   Holiday Eating Survival Tips:  -Group instruction provided by PowerPoint slides, verbal discussion, and written materials to support subject matter. The instructor gives patients tips, tricks, and techniques to help them not only survive but enjoy the holidays despite the onslaught of food that accompanies the holidays.   Knowledge Questionnaire Score: Knowledge Questionnaire Score - 04/25/18 1533      Knowledge Questionnaire Score   Pre Score  17/18       Core Components/Risk Factors/Patient Goals at Admission: Personal Goals and Risk Factors at Admission - 04/25/18 1518      Core Components/Risk Factors/Patient Goals on Admission    Weight Management  Yes;Weight Loss    Intervention  Obesity: Provide education and appropriate resources to help participant work on and attain dietary goals.;Weight Management/Obesity: Establish reasonable  short term and long term weight goals.;Weight Management: Provide education and appropriate resources to help participant work on and attain dietary goals.;Weight Management: Develop a combined nutrition and exercise program designed to reach desired caloric intake, while maintaining appropriate intake of nutrient and fiber, sodium and fats, and appropriate energy expenditure required for the weight goal.    Admit Weight  249 lb 1.9 oz (113 kg)    Goal Weight: Short Term  240 lb (108.9 kg)    Goal Weight: Long Term  235 lb (106.6 kg)    Expected Outcomes  Understanding of distribution of calorie intake throughout the day with the consumption of 4-5 meals/snacks;Understanding recommendations for meals to include 15-35% energy as protein, 25-35% energy from fat, 35-60% energy from carbohydrates, less than 2110m of dietary cholesterol, 20-35 gm of total fiber daily;Weight Loss: Understanding of general recommendations for a balanced deficit meal plan, which promotes 1-2 lb weight  loss per week and includes a negative energy balance of [phone] kcal/d;Long Term: Adherence to nutrition and physical activity/exercise program aimed toward attainment of established weight goal;Short Term: Continue to assess and modify interventions until short term weight is achieved    Improve shortness of breath with ADL's  Yes    Intervention  Provide education, individualized exercise plan and daily activity instruction to help decrease symptoms of SOB with activities of daily living.    Expected Outcomes  Short Term: Improve cardiorespiratory fitness to achieve a reduction of symptoms when performing ADLs;Long Term: Be able to perform more ADLs without symptoms or delay the onset of symptoms    Stress  Yes    Intervention  Offer individual and/or small group education and counseling on adjustment to heart disease, stress management and health-related lifestyle change. Teach and support self-help strategies.    Expected  Outcomes  Short Term: Participant demonstrates changes in health-related behavior, relaxation and other stress management skills, ability to obtain effective social support, and compliance with psychotropic medications if prescribed.;Long Term: Emotional wellbeing is indicated by absence of clinically significant psychosocial distress or social isolation.       Core Components/Risk Factors/Patient Goals Review:    Core Components/Risk Factors/Patient Goals at Discharge (Final Review):    ITP Comments: ITP Comments    Row Name 04/25/18 1442           ITP Comments  Dr. Manfred Arch, Medical Director Pulmonary Rehab          Comments:

## 2018-05-13 ENCOUNTER — Ambulatory Visit: Payer: Self-pay | Admitting: Neurology

## 2018-05-13 NOTE — Telephone Encounter (Signed)
Called and spoke with pt stating to her that I was told for her to call the billing dept to get further help in regards to the bill she received and pt stated to me that she had called the number that was on the bill which she said was the billing dept.  Stated to pt that I would see if there is any other recommendations I could find out for her. Pt expressed understanding. Leaving encounter open.

## 2018-05-13 NOTE — Telephone Encounter (Signed)
Heard back from [employer] and [employer] from [employer] in regards to the message I had sent to them. Per Lenna Sciara and Levada Dy, pt was private pay. If pt had any questions/problems in regards to a bill she had received, she could call [employer]'s billing dept at [phone] for further help in regards to the bill she received.   Attempted to call pt but unable to reach her. Left message for pt to return call.

## 2018-05-13 NOTE — Telephone Encounter (Signed)
Pt is calling back [phone]

## 2018-05-17 ENCOUNTER — Ambulatory Visit (HOSPITAL_COMMUNITY): Payer: Self-pay

## 2018-05-18 ENCOUNTER — Telehealth: Payer: Self-pay | Admitting: Pulmonary Disease

## 2018-05-18 NOTE — Telephone Encounter (Signed)
Called and spoke with pt stating to her the information I found out about the bill she received and stated to her that [employer] is deleting the bill she received and will be sending a new bill to her that she should have received for the aerochamber. Pt expressed understanding. Nothing further needed.

## 2018-05-18 NOTE — Telephone Encounter (Signed)
Received a community message from [employer] stating they should be able to delete the bill that had been placed for the flutter valve and enter a new one for the aerochamber since it is what pt had received and this is what the bill should have been for. [name] stated that she would let me know if they ran into any problems.  Will await a return call from pt so I can tell her this.

## 2018-05-18 NOTE — Telephone Encounter (Signed)
Have spoken with Darlina Guys and Jolee Ewing from [employer] in regards to the bill that pt received.  At pt's OV with Dr. Valeta Harms 04/04/2018, aerochamber was ordered and an Rx was printed for the aerochamber. On the form that goes to [facility], flutter valve is what was marked on the paper. The bill that pt also received was for the flutter valve as it is what was marked on the [facility] form.  I stated to [facility] that the aerochamber is what was ordered and that is what was received at the OV by pt and flutter valve is not what should have been marked. Stated to [facility] that I was going to get with [employer] who is over our billing to see if there is anything she might be able to do to help out with this and Lenna Sciara also stated to me she would see if there is anything she could figure out as well.  Attempted to call pt to discuss the new findings with her but was unable to reach her. Left message for pt to return call.  I am routing encounter to [place].

## 2018-05-18 NOTE — Telephone Encounter (Signed)
See phone note from 05/13/2018 as this is a duplicate. Closing encounter.

## 2018-05-19 ENCOUNTER — Ambulatory Visit (HOSPITAL_COMMUNITY): Payer: Self-pay

## 2018-05-20 ENCOUNTER — Encounter: Payer: Self-pay | Admitting: Internal Medicine

## 2018-05-20 ENCOUNTER — Other Ambulatory Visit: Payer: Self-pay | Admitting: Pulmonary Disease

## 2018-05-20 ENCOUNTER — Other Ambulatory Visit: Payer: Self-pay | Admitting: Internal Medicine

## 2018-05-20 ENCOUNTER — Ambulatory Visit: Payer: Self-pay | Admitting: *Deleted

## 2018-05-20 DIAGNOSIS — L03119 Cellulitis of unspecified part of limb: Secondary | ICD-10-CM

## 2018-05-20 MED ORDER — DOXYCYCLINE HYCLATE 100 MG PO TABS: 100.0000 mg | ORAL_TABLET | Freq: Two times a day (BID) | ORAL | 0 refills | Status: DC

## 2018-05-20 NOTE — Telephone Encounter (Signed)
Summary: lower leg swelling   Pt states her left lower leg/foot is swollen. Pt states she thought it was gout, but now thinks it may be cellulitis and she would like to speak with a nurse to avoid coming in to the office. Please call pt to advise.      Call to patient- patient states she has been treating her foot and ankle as gout- but she is afraid that it is not gout and is now cellulitis. Patient refuses office visit at this time- she will take pictures and try to post on MyChart- but she is afraid to come out due to her lung disease. Told patient would send information to her PCP for review. Reason for Disposition . [1] Red area or streak [2] large (> 2 in. or 5 cm)    Patient is reporting pain and redness that she thinks may be turning into cellulitis- she is wanting treatment for that. She is refusing office visit due to her COPD and the virus in the community.  Answer Assessment - Initial Assessment Questions 1. ONSET: "When did the swelling start?" (e.g., minutes, hours, days)     Swelling started last Saturday- Sunday she could barely walk 2. LOCATION: "What part of the leg is swollen?"  "Are both legs swollen or just one leg?"     Foot and lower ankle of left foot 3. SEVERITY: "How bad is the swelling?" (e.g., localized; mild, moderate, severe)  - Localized - small area of swelling localized to one leg  - MILD pedal edema - swelling limited to foot and ankle, pitting edema < 1/4 inch (6 mm) deep, rest and elevation eliminate most or all swelling  - MODERATE edema - swelling of lower leg to knee, pitting edema > 1/4 inch (6 mm) deep, rest and elevation only partially reduce swelling  - SEVERE edema - swelling extends above knee, facial or hand swelling present      Toes are swollen, pitting edema on side of foot- very painful to walk 4. REDNESS: "Does the swelling look red or infected?"     Redness on inside of left foot 5. PAIN: "Is the swelling painful to touch?" If so, ask: "How  painful is it?"   (Scale 1-10; mild, moderate or severe)     Pain is "more" in the joint- that was why she thought it was gout- and she states it still may be. Putting weight hurts 6. FEVER: "Do you have a fever?" If so, ask: "What is it, how was it measured, and when did it start?"      No fever 7. CAUSE: "What do you think is causing the leg swelling?"     Patient thought gout- using colchicines 8. MEDICAL HISTORY: "Do you have a history of heart failure, kidney disease, liver failure, or cancer?"     COPD 9. RECURRENT SYMPTOM: "Have you had leg swelling before?" If so, ask: "When was the last time?" "What happened that time?"     Not like this- when gout flares yes- but not like this- medication is not helping 10. OTHER SYMPTOMS: "Do you have any other symptoms?" (e.g., chest pain, difficulty breathing)       Walking is more difficult due to the pain 11. PREGNANCY: "Is there any chance you are pregnant?" "When was your last menstrual period?"       n/a  Protocols used: LEG SWELLING AND EDEMA-A-AH

## 2018-05-20 NOTE — Telephone Encounter (Signed)
Mychart message sent to patient.

## 2018-05-23 ENCOUNTER — Telehealth (HOSPITAL_COMMUNITY): Payer: Self-pay

## 2018-05-23 NOTE — Telephone Encounter (Signed)
Aaron Edelman please advise if ok to refill. These were last refilled by Dr. Lenna Gilford. Dr. Valeta Harms is currently out of the office until 05/27/18. Thank you.

## 2018-05-24 ENCOUNTER — Ambulatory Visit (HOSPITAL_COMMUNITY): Payer: Self-pay

## 2018-05-24 NOTE — Addendum Note (Signed)
Encounter addended by: Lance Morin, RN on: 05/24/2018 4:00 PM  Actions taken: Flowsheet data copied forward, Visit Navigator Flowsheet section accepted, Clinical Note Signed

## 2018-05-24 NOTE — Progress Notes (Signed)
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  ALPRAZolam (XANAX) 0.5 MG tablet, Take 1 tab three times per day as needed for nerves MAX DAILY DOSE = 3 TABLETS (Patient taking differently: 0.5 mg at bedtime as needed. ), Disp: 270 tablet, Rfl: 1 .  Apple Cider Vinegar 500 MG TABS, Take 1,000 mg by mouth daily., Disp: , Rfl:  .  Ascorbic Acid (VITAMIN C) 1000 MG tablet, Take 1,000 mg by mouth daily., Disp: , Rfl:  .  baclofen (LIORESAL) 10 MG tablet, Take 10 mg by mouth. Taking 1 nightly, Disp: , Rfl:  .  cetirizine (ZYRTEC) 10 MG tablet, Take 10 mg by mouth daily.  , Disp: , Rfl:  .  Cholecalciferol (VITAMIN D-3) 5000 UNITS TABS, Take 1 tablet by mouth daily. Per pt medication list, takes 1,000, Disp: , Rfl:  .  CINNAMON PO, Take 325 mg by mouth every morning. Takes 2 every am, Disp: , Rfl:  .  COLCRYS 0.6 MG tablet, TAKE ONE TABLET BY MOUTH DAILY, Disp: 30 tablet, Rfl: 0 .  cyanocobalamin 2000 MCG tablet, Take 2,500 mcg by mouth 2 (two) times daily., Disp: , Rfl:  .  furosemide (LASIX) 40 MG tablet, TAKE 1-2 TABLETS ONCE A DAY AS NEEDED FOR SWELLING (Patient taking differently: 80 mg. TAKE 1 tab daily), Disp: 60 tablet, Rfl: 11 .  glucosamine-chondroitin 500-400 MG tablet, Take 2 tablets by mouth 2 (two) times daily. , Disp: , Rfl:  .  guaiFENesin (MUCINEX) 600 MG 12 hr tablet, Take 400 mg by mouth 2 (two) times daily. Tales 3 tab;es twice a day, Disp: , Rfl:  .  ipratropium-albuterol (DUONEB) 0.5-2.5 (3) MG/3ML SOLN, Take 3 mLs by nebulization 3 (three) times daily. (Patient taking differently: Take 3 mLs by nebulization 2 (two) times daily. ), Disp: 270 mL, Rfl: 7 .  Magnesium 400 MG CAPS, Take 3 capsules by  mouth 2 (two) times daily. , Disp: , Rfl:  .  Misc Natural Products (TART CHERRY ADVANCED PO), Take by mouth., Disp: , Rfl:  .  montelukast (SINGULAIR) 10 MG tablet, Take 1 tablet (10 mg total) by mouth at bedtime., Disp: 30 tablet, Rfl: 6 .  OXYGEN, Inhale 4 L into the lungs., Disp: , Rfl:  .  Spacer/Aero-Holding Chambers (AEROCHAMBER MV) inhaler, Use as instructed, Disp: 1 each, Rfl: 0 .  SYMBICORT 160-4.5 MCG/ACT inhaler, INHALE 2 PUFFS INTO THE LUNGS 2 TIMES DAILY (Patient taking differently: Only taking 2 puffs once a day), Disp: 1 Inhaler, Rfl: 5 .  Tiotropium Bromide Monohydrate (SPIRIVA RESPIMAT) 2.5 MCG/ACT AERS, Inhale 2 puffs into the lungs daily., Disp: 1 Inhaler, Rfl: 0 .  traMADol (ULTRAM) 50 MG tablet, Take 1 tablet (50 mg total) by mouth 3 (three) times daily as needed for moderate pain., Disp: 90 tablet, Rfl: 3 .  TURMERIC PO, Take 500 mg by mouth 2 (two) times daily. Takes 1,000 mg, Disp: , Rfl:  .  vitamin A 10000 UNIT capsule, Take 5,000 Units by mouth daily. Per pt list - takes 25,000, Disp: , Rfl:  .  albuterol (VENTOLIN HFA) 108 (90 Base) MCG/ACT inhaler, INHALE 2 PUFFS INTO THE LUNGS EVERY 6 HOURS AS NEEDED FOR WHEEZING OR SHORTNESS OF BREATH, Disp: 54 each, Rfl: 4 .  colchicine (COLCRYS) 0.6 MG tablet, Take 1 tablet (0.6 mg total) by mouth daily as needed., Disp: 30 tablet, Rfl: 3 .  doxycycline (VIBRA-TABS) 100 MG tablet, Take 1 tablet (100 mg total) by mouth 2 (two) times daily., Disp: 20 tablet, Rfl: 0 .  fluticasone (FLONASE) 50 MCG/ACT nasal spray, Place 2 sprays into both nostrils daily as needed. , Disp: , Rfl:  .  loperamide (IMODIUM) 2 MG capsule, Take 1 capsule (2 mg total) by mouth 4 (four) times daily as needed for diarrhea or loose stools., Disp: 12 capsule, Rfl: 0 .  nystatin (MYCOSTATIN/NYSTOP) powder, Apply topically 4 (four) times daily., Disp: 15 g, Rfl: 0  Past Medical History: Past Medical History:  Diagnosis Date  . Chest pain   . Chronic venous  insufficiency   . COPD (chronic obstructive pulmonary disease) ([place])   . DJD (degenerative joint disease)   . Dysthymia   . Fibromyalgia   . GERD (gastroesophageal reflux disease)   . History of headache   . Hodgkin lymphoma of extranodal or solid organ site [facility])   . Hypothyroid   . IBS (irritable bowel syndrome)   . Metabolic syndrome X   . Morbid obesity ([place])   . OSA (obstructive sleep apnea)   . Vitamin D deficiency     Tobacco Use: Social History   Tobacco Use  Smoking Status Former Smoker  . Packs/day: 1.00  . Years: 29.00  . Pack years: 29.00  . Types: Cigarettes  . Start date: 03/02/1962  . Last attempt to quit: 09/27/1991  . Years since quitting: 26.6  Smokeless Tobacco Never Used    Labs: Recent Chemical engineer    Labs for ITP Cardiac and Pulmonary Rehab Latest Ref Rng & Units 05/05/2012 05/09/2012 05/05/2013 11/17/2013 03/16/2017   Cholestrol 0 - 200 mg/dL 168 - - - -   LDLCALC 0 - 99 mg/dL 119(H) - - - -   HDL >39.00 mg/dL 26.20(L) - - - -   Trlycerides 0.0 - 149.0 mg/dL 113.0 - - - -   Hemoglobin A1c 4.6 - 6.5 % - 6.8(H) 6.8(H) 6.7(H) 6.2      Capillary Blood Glucose: No results found for: GLUCAP   Pulmonary Assessment Scores: Pulmonary Assessment Scores    Row Name 04/25/18 1536 04/25/18 1640 05/12/18 1558     ADL UCSD   ADL Phase  Entry  -  Entry   SOB Score total  99  89  -     CAT Score   CAT Score  17  -  -     mMRC Score   mMRC Score  -  -  3      Pulmonary Function Assessment:   Exercise Target Goals: Exercise Program Goal: Individual exercise prescription set using results from initial 6 min walk test and THRR while considering  patient's activity barriers and safety.   Exercise Prescription Goal: Initial exercise prescription builds to 30-45 minutes a day of aerobic activity, 2-3 days per week.  Home exercise guidelines will be given to patient during program as part of exercise prescription that the participant will  acknowledge.  Activity Barriers & Risk Stratification: Activity Barriers & Cardiac Risk Stratification - 04/25/18 1447      Activity Barriers & Cardiac Risk Stratification   Activity Barriers  Shortness of Breath;Deconditioning;Arthritis   bilateral knees   Cardiac Risk Stratification  Low       6 Minute Walk: 6 Minute Walk    Row Name 05/12/18 1413  6 Minute Walk   Phase  Initial     Distance  600 feet     Walk Time  - 4 minutes 50 seconds     # of Rest Breaks  1 1 minute 10 seconds     MPH  1.13     METS  2     RPE  12     Perceived Dyspnea   3     Symptoms  Yes (comment)     Comments  used wheelchair     Resting HR  99 bpm     Resting BP  126/70     Resting Oxygen Saturation   91 %     Exercise Oxygen Saturation  during 6 min walk  87 %     Max Ex. HR  113 bpm     Max Ex. BP  140/68       Interval HR   1 Minute HR  104     2 Minute HR  110     3 Minute HR  111     4 Minute HR  113     5 Minute HR  108     6 Minute HR  111     2 Minute Post HR  97     Interval Heart Rate?  Yes       Interval Oxygen   Interval Oxygen?  Yes     Baseline Oxygen Saturation %  91 %     1 Minute Oxygen Saturation %  89 %     1 Minute Liters of Oxygen  4 L     2 Minute Oxygen Saturation %  88 %     2 Minute Liters of Oxygen  4 L     3 Minute Oxygen Saturation %  87 %     3 Minute Liters of Oxygen  4 L     4 Minute Oxygen Saturation %  89 %     4 Minute Liters of Oxygen  6 L     5 Minute Oxygen Saturation %  89 %     5 Minute Liters of Oxygen  6 L     6 Minute Oxygen Saturation %  93 %     6 Minute Liters of Oxygen  6 L     2 Minute Post Oxygen Saturation %  96 %     2 Minute Post Liters of Oxygen  6 L        Oxygen Initial Assessment: Oxygen Initial Assessment - 05/12/18 1558      Initial 6 min Walk   Oxygen Used  Continuous;E-Tanks    Liters per minute  6      Program Oxygen Prescription   Program Oxygen Prescription  Continuous;E-Tanks    Liters per  minute  6       Oxygen Re-Evaluation:   Oxygen Discharge (Final Oxygen Re-Evaluation):   Initial Exercise Prescription: Initial Exercise Prescription - 05/12/18 1400      Date of Initial Exercise RX and Referring Provider   Date  05/12/18    Referring Provider  Dr. Valeta Harms      Oxygen   Oxygen  Continuous    Liters  6      NuStep   Level  2    SPM  80    Minutes  17    METs  1.5      Track   Laps  3    Minutes  17      Prescription Details   Frequency (times per week)  2    Duration  Progress to 45 minutes of aerobic exercise without signs/symptoms of physical distress      Intensity   THRR 40-80% of Max Heartrate  60-120    Ratings of Perceived Exertion  11-13    Perceived Dyspnea  0-4      Progression   Progression  Continue to progress workloads to maintain intensity without signs/symptoms of physical distress.      Resistance Training   Training Prescription  Yes    Weight  orange bands    Reps  10-15       Perform Capillary Blood Glucose checks as needed.  Exercise Prescription Changes:   Exercise Comments:   Exercise Goals and Review: Exercise Goals    Row Name 04/25/18 1443             Exercise Goals   Increase Physical Activity  Yes       Intervention  Provide advice, education, support and counseling about physical activity/exercise needs.;Develop an individualized exercise prescription for aerobic and resistive training based on initial evaluation findings, risk stratification, comorbidities and participant's personal goals.       Expected Outcomes  Short Term: Attend rehab on a regular basis to increase amount of physical activity.;Long Term: Add in home exercise to make exercise part of routine and to increase amount of physical activity.;Long Term: Exercising regularly at least 3-5 days a week.       Increase Strength and Stamina  Yes       Intervention  Provide advice, education, support and counseling about physical activity/exercise  needs.;Develop an individualized exercise prescription for aerobic and resistive training based on initial evaluation findings, risk stratification, comorbidities and participant's personal goals.       Expected Outcomes  Short Term: Increase workloads from initial exercise prescription for resistance, speed, and METs.;Short Term: Perform resistance training exercises routinely during rehab and add in resistance training at home;Long Term: Improve cardiorespiratory fitness, muscular endurance and strength as measured by increased METs and functional capacity (6MWT)       Able to understand and use rate of perceived exertion (RPE) scale  Yes       Intervention  Provide education and explanation on how to use RPE scale       Expected Outcomes  Short Term: Able to use RPE daily in rehab to express subjective intensity level;Long Term:  Able to use RPE to guide intensity level when exercising independently       Able to understand and use Dyspnea scale  Yes       Intervention  Provide education and explanation on how to use Dyspnea scale       Expected Outcomes  Short Term: Able to use Dyspnea scale daily in rehab to express subjective sense of shortness of breath during exertion;Long Term: Able to use Dyspnea scale to guide intensity level when exercising independently       Knowledge and understanding of Target Heart Rate Range (THRR)  Yes       Intervention  Provide education and explanation of THRR including how the numbers were predicted and where they are located for reference       Expected Outcomes  Short Term: Able to state/look up THRR;Short Term: Able to use daily as guideline for intensity in rehab;Long Term: Able to use THRR to govern intensity when exercising independently  Able to check pulse independently  Yes       Intervention  Provide education and demonstration on how to check pulse in carotid and radial arteries.;Review the importance of being able to check your own pulse for safety  during independent exercise       Expected Outcomes  Short Term: Able to explain why pulse checking is important during independent exercise;Long Term: Able to check pulse independently and accurately       Understanding of Exercise Prescription  Yes       Intervention  Provide education, explanation, and written materials on patient's individual exercise prescription       Expected Outcomes  Short Term: Able to explain program exercise prescription;Long Term: Able to explain home exercise prescription to exercise independently          Exercise Goals Re-Evaluation :   Discharge Exercise Prescription (Final Exercise Prescription Changes):   Nutrition:  Target Goals: Understanding of nutrition guidelines, daily intake of sodium <1554m, cholesterol <2021m calories 30% from fat and 7% or less from saturated fats, daily to have 5 or more servings of fruits and vegetables.  Biometrics:    Nutrition Therapy Plan and Nutrition Goals: Nutrition Therapy & Goals - 04/29/18 1613      Nutrition Therapy   Diet  carb modified      Personal Nutrition Goals   Nutrition Goal  pt to identify food quantities necessary to achieve weight loss of 1/2 -2 lbs per week to a goal weight loss of 6-24 lbs at graduation from pulmonary rehab    Personal Goal #2  pt to build a healthy plate including fruits, vegetables, whole grains, and low-fat dairy products in a healthy meal plan      Intervention Plan   Intervention  Prescribe, educate and counsel regarding individualized specific dietary modifications aiming towards targeted core components such as weight, hypertension, lipid management, diabetes, heart failure and other comorbidities.    Expected Outcomes  Short Term Goal: Understand basic principles of dietary content, such as calories, fat, sodium, cholesterol and nutrients.;Long Term Goal: Adherence to prescribed nutrition plan.       Nutrition Assessments: Nutrition Assessments - 04/29/18 1633       Rate Your Plate Scores   Pre Score  41       Nutrition Goals Re-Evaluation:   Nutrition Goals Discharge (Final Nutrition Goals Re-Evaluation):   Psychosocial: Target Goals: Acknowledge presence or absence of significant depression and/or stress, maximize coping skills, provide positive support system. Participant is able to verbalize types and ability to use techniques and skills needed for reducing stress and depression.  Initial Review & Psychosocial Screening: Initial Psych Review & Screening - 04/25/18 1640      Initial Review   Current issues with  Current Stress Concerns    Source of Stress Concerns  Chronic Illness;Unable to perform yard/household activities;Financial;Unable to participate in former interests or hobbies    Comments  over the last year pt has steady decline in her health.  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  Has 2 roommates in order to make ends meet.  although estanged from family and daugter this is not a stressor for her      Family Dynamics   Comments  no relationship with any family members.  Has a sister who will speak to her on occasion.  Talks to brother whom she leases a home from about house realted issues and rent. Gave up daughter for adoption.  Found daughter - met but  later became estranged. Significant other passed away 3 years ago.  Has 2 roommates who live with her no interaction with them       Quality of Life Scores:  Scores of 19 and below usually indicate a poorer quality of life in these areas.  A difference of  2-3 points is a clinically meaningful difference.  A difference of 2-3 points in the total score of the Quality of Life Index has been associated with significant improvement in overall quality of life, self-image, physical symptoms, and general health in studies assessing change in quality of life.  PHQ-9: Recent Review Flowsheet Data    Depression screen [facility] 2/9 04/25/2018 03/08/2018 08/10/2011   Decreased Interest 0 0 1   Down,  Depressed, Hopeless 2 0 1   PHQ - 2 Score 2 0 2   Altered sleeping 1 0 -   Tired, decreased energy 1 0 -   Change in appetite 0 0 -   Feeling bad or failure about yourself  3 0 -   Trouble concentrating 0 0 -   Moving slowly or fidgety/restless 0 0 -   Suicidal thoughts 0 0 -   PHQ-9 Score 7 0 -   Difficult doing work/chores Very difficult  - -     Interpretation of Total Score  Total Score Depression Severity:  1-4 = Minimal depression, 5-9 = Mild depression, 10-14 = Moderate depression, 15-19 = Moderately severe depression, 20-27 = Severe depression   Psychosocial Evaluation and Intervention: Psychosocial Evaluation - 05/24/18 1554      Psychosocial Evaluation & Interventions   Interventions  Encouraged to exercise with the program and follow exercise prescription;Stress management education;Relaxation education    Comments  Could benefit from stress and relaxation classes to deal with financial, health, and intra-family stress    Expected Outcomes  Learn healthy ways to deal with stress    Continue Psychosocial Services   Follow up required by staff       Psychosocial Re-Evaluation: Psychosocial Re-Evaluation    Albers Name 05/24/18 1556             Psychosocial Re-Evaluation   Current issues with  Current Stress Concerns;Current Sleep Concerns       Comments  Has not started program d/t COVID-19 precautions       Expected Outcomes  No barriers to participation in pulmonary rehab       Interventions  Encouraged to attend Pulmonary Rehabilitation for the exercise;Relaxation education;Stress management education       Continue Psychosocial Services   Follow up required by staff       Comments  over the last year pt has steady decline in her health.  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  Has 2 roommates in order to make ends meet.  although estanged from family and daugter this is not a stressor for her         Initial Review   Source of Stress Concerns  Chronic Illness;Unable to  perform yard/household activities;Financial;Unable to participate in former interests or hobbies          Psychosocial Discharge (Final Psychosocial Re-Evaluation): Psychosocial Re-Evaluation - 05/24/18 1556      Psychosocial Re-Evaluation   Current issues with  Current Stress Concerns;Current Sleep Concerns    Comments  Has not started program d/t COVID-19 precautions    Expected Outcomes  No barriers to participation in pulmonary rehab    Interventions  Encouraged to attend Pulmonary Rehabilitation for the exercise;Relaxation education;Stress management  education    Continue Psychosocial Services   Follow up required by staff    Comments  over the last year pt has steady decline in her health.  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  Has 2 roommates in order to make ends meet.  although estanged from family and daugter this is not a stressor for her      Initial Review   Source of Stress Concerns  Chronic Illness;Unable to perform yard/household activities;Financial;Unable to participate in former interests or hobbies       Education: Education Goals: Education classes will be provided on a weekly basis, covering required topics. Participant will state understanding/return demonstration of topics presented.  Learning Barriers/Preferences: Learning Barriers/Preferences - 04/25/18 1514      Learning Barriers/Preferences   Learning Preferences  Group Instruction;Individual Instruction;Pictoral;Audio;Video;Verbal Instruction       Education Topics: Risk Factor Reduction:  -Group instruction that is supported by a PowerPoint presentation. Instructor discusses the definition of a risk factor, different risk factors for pulmonary disease, and how the heart and lungs work together.     Nutrition for Pulmonary Patient:  -Group instruction provided by PowerPoint slides, verbal discussion, and written materials to support subject matter. The instructor gives an explanation and review of healthy  diet recommendations, which includes a discussion on weight management, recommendations for fruit and vegetable consumption, as well as protein, fluid, caffeine, fiber, sodium, sugar, and alcohol. Tips for eating when patients are short of breath are discussed.   Pursed Lip Breathing:  -Group instruction that is supported by demonstration and informational handouts. Instructor discusses the benefits of pursed lip and diaphragmatic breathing and detailed demonstration on how to preform both.     Oxygen Safety:  -Group instruction provided by PowerPoint, verbal discussion, and written material to support subject matter. There is an overview of "What is Oxygen" and "Why do we need it".  Instructor also reviews how to create a safe environment for oxygen use, the importance of using oxygen as prescribed, and the risks of noncompliance. There is a brief discussion on traveling with oxygen and resources the patient may utilize.   Oxygen Equipment:  -Group instruction provided by [facility] Staff utilizing handouts, written materials, and equipment demonstrations.   Signs and Symptoms:  -Group instruction provided by written material and verbal discussion to support subject matter. Warning signs and symptoms of infection, stroke, and heart attack are reviewed and when to call the physician/911 reinforced. Tips for preventing the spread of infection discussed.   Advanced Directives:  -Group instruction provided by verbal instruction and written material to support subject matter. Instructor reviews Advanced Directive laws and proper instruction for filling out document.   Pulmonary Video:  -Group video education that reviews the importance of medication and oxygen compliance, exercise, good nutrition, pulmonary hygiene, and pursed lip and diaphragmatic breathing for the pulmonary patient.   Exercise for the Pulmonary Patient:  -Group instruction that is supported by a PowerPoint presentation.  Instructor discusses benefits of exercise, core components of exercise, frequency, duration, and intensity of an exercise routine, importance of utilizing pulse oximetry during exercise, safety while exercising, and options of places to exercise outside of rehab.     Pulmonary Medications:  -Verbally interactive group education provided by instructor with focus on inhaled medications and proper administration.   Anatomy and Physiology of the Respiratory System and Intimacy:  -Group instruction provided by PowerPoint, verbal discussion, and written material to support subject matter. Instructor reviews respiratory cycle and anatomical components of  the respiratory system and their functions. Instructor also reviews differences in obstructive and restrictive respiratory diseases with examples of each. Intimacy, Sex, and Sexuality differences are reviewed with a discussion on how relationships can change when diagnosed with pulmonary disease. Common sexual concerns are reviewed.   MD DAY -A group question and answer session with a medical doctor that allows participants to ask questions that relate to their pulmonary disease state.   OTHER EDUCATION -Group or individual verbal, written, or video instructions that support the educational goals of the pulmonary rehab program.   Holiday Eating Survival Tips:  -Group instruction provided by PowerPoint slides, verbal discussion, and written materials to support subject matter. The instructor gives patients tips, tricks, and techniques to help them not only survive but enjoy the holidays despite the onslaught of food that accompanies the holidays.   Knowledge Questionnaire Score: Knowledge Questionnaire Score - 04/25/18 1533      Knowledge Questionnaire Score   Pre Score  17/18       Core Components/Risk Factors/Patient Goals at Admission: Personal Goals and Risk Factors at Admission - 04/25/18 1518      Core Components/Risk Factors/Patient  Goals on Admission    Weight Management  Yes;Weight Loss    Intervention  Obesity: Provide education and appropriate resources to help participant work on and attain dietary goals.;Weight Management/Obesity: Establish reasonable short term and long term weight goals.;Weight Management: Provide education and appropriate resources to help participant work on and attain dietary goals.;Weight Management: Develop a combined nutrition and exercise program designed to reach desired caloric intake, while maintaining appropriate intake of nutrient and fiber, sodium and fats, and appropriate energy expenditure required for the weight goal.    Admit Weight  249 lb 1.9 oz (113 kg)    Goal Weight: Short Term  240 lb (108.9 kg)    Goal Weight: Long Term  235 lb (106.6 kg)    Expected Outcomes  Understanding of distribution of calorie intake throughout the day with the consumption of 4-5 meals/snacks;Understanding recommendations for meals to include 15-35% energy as protein, 25-35% energy from fat, 35-60% energy from carbohydrates, less than 231m of dietary cholesterol, 20-35 gm of total fiber daily;Weight Loss: Understanding of general recommendations for a balanced deficit meal plan, which promotes 1-2 lb weight loss per week and includes a negative energy balance of [phone] kcal/d;Long Term: Adherence to nutrition and physical activity/exercise program aimed toward attainment of established weight goal;Short Term: Continue to assess and modify interventions until short term weight is achieved    Improve shortness of breath with ADL's  Yes    Intervention  Provide education, individualized exercise plan and daily activity instruction to help decrease symptoms of SOB with activities of daily living.    Expected Outcomes  Short Term: Improve cardiorespiratory fitness to achieve a reduction of symptoms when performing ADLs;Long Term: Be able to perform more ADLs without symptoms or delay the onset of symptoms    Stress   Yes    Intervention  Offer individual and/or small group education and counseling on adjustment to heart disease, stress management and health-related lifestyle change. Teach and support self-help strategies.    Expected Outcomes  Short Term: Participant demonstrates changes in health-related behavior, relaxation and other stress management skills, ability to obtain effective social support, and compliance with psychotropic medications if prescribed.;Long Term: Emotional wellbeing is indicated by absence of clinically significant psychosocial distress or social isolation.       Core Components/Risk Factors/Patient Goals Review:  Goals and  Risk Factor Review    Row Name 05/24/18 1558             Core Components/Risk Factors/Patient Goals Review   Personal Goals Review  Weight Management/Obesity;Improve shortness of breath with ADL's;Increase knowledge of respiratory medications and ability to use respiratory devices properly.;Develop more efficient breathing techniques such as purse lipped breathing and diaphragmatic breathing and practicing self-pacing with activity.;Stress       Review  Has not started program d/t COVID-19 precautions, pulmonary rehab is closed for now to 06/21/2018       Expected Outcomes  See admission goals          Core Components/Risk Factors/Patient Goals at Discharge (Final Review):  Goals and Risk Factor Review - 05/24/18 1558      Core Components/Risk Factors/Patient Goals Review   Personal Goals Review  Weight Management/Obesity;Improve shortness of breath with ADL's;Increase knowledge of respiratory medications and ability to use respiratory devices properly.;Develop more efficient breathing techniques such as purse lipped breathing and diaphragmatic breathing and practicing self-pacing with activity.;Stress    Review  Has not started program d/t COVID-19 precautions, pulmonary rehab is closed for now to 06/21/2018    Expected Outcomes  See admission goals        ITP Comments: ITP Comments    Row Name 04/25/18 1442           ITP Comments  Dr. Manfred Arch, Medical Director Pulmonary Rehab          Comments: ITP REVIEW Pt is making expected progress toward pulmonary rehab goals after completing 0 sessions. Recommend continued exercise, life style modification, education, and utilization of breathing techniques to increase stamina and strength and decrease shortness of breath with exertion.

## 2018-05-26 ENCOUNTER — Ambulatory Visit (HOSPITAL_COMMUNITY): Payer: Self-pay

## 2018-05-26 ENCOUNTER — Ambulatory Visit: Payer: Self-pay | Admitting: Internal Medicine

## 2018-05-26 NOTE — Telephone Encounter (Signed)
  I returned her call.   See triage notes. She saw Dr. Jerilee Hoh recently for her left foot pain and swelling.    She wants to know if Dr. Jerilee Hoh wants to continue her antibiotics longer or what.   She has 7 pills left (3 days).    Due to the coronavirus pandemic she does not want to come in.  See triage notes.  I let her know someone would call her back.  I sent these notes to the office at [facility]. Reason for Disposition . [1] Swollen foot AND [2] no fever  (Exceptions: localized bump from bunions, calluses, insect bite, sting)  Answer Assessment - Initial Assessment Questions 1. ONSET: "When did the pain start?"      I saw Dr Isaac Bliss for my left foot.   She gave me an antibiotic.   My foot is 75% better.   It's still painful and swelling.    2. LOCATION: "Where is the pain located?"      Left foot.   I have one toe that is more red.   I'm keeping my leg elevated which helps. 3. PAIN: "How bad is the pain?"    (Scale 1-10; or mild, moderate, severe)   -  MILD (1-3): doesn't interfere with normal activities    -  MODERATE (4-7): interferes with normal activities (e.g., work or school) or awakens from sleep, limping    -  SEVERE (8-10): excruciating pain, unable to do any normal activities, unable to walk     The 2nd toe looks like it might have a bug bite. 4. WORK OR EXERCISE: "Has there been any recent work or exercise that involved this part of the body?"      No 5. CAUSE: "What do you think is causing the foot pain?"     I since pictures but they didn't come across very good. 6. OTHER SYMPTOMS: "Do you have any other symptoms?" (e.g., leg pain, rash, fever, numbness)     Pitting edema.   Does not want Keflex. 7. PREGNANCY: "Is there any chance you are pregnant?" "When was your last menstrual period?"     N/A due to age  Protocols used: FOOT PAIN-A-AH

## 2018-05-26 NOTE — Telephone Encounter (Signed)
Spoke with patient and a WebEx appointment made

## 2018-05-27 ENCOUNTER — Ambulatory Visit (INDEPENDENT_AMBULATORY_CARE_PROVIDER_SITE_OTHER): Payer: Medicare HMO | Admitting: Internal Medicine

## 2018-05-27 ENCOUNTER — Other Ambulatory Visit: Payer: Self-pay

## 2018-05-27 ENCOUNTER — Encounter: Payer: Self-pay | Admitting: Internal Medicine

## 2018-05-27 DIAGNOSIS — R2242 Localized swelling, mass and lump, left lower limb: Secondary | ICD-10-CM | POA: Diagnosis not present

## 2018-05-27 NOTE — Progress Notes (Signed)
Virtual Visit via Telephone Note  I connected with Rebecca [name] on 05/27/18 at  2:30 PM EDT by telephone and verified that I am speaking with the correct person using two identifiers. We initially attempted a WebEx visit, but were unsuccessful due to technical difficulties on the patients' side.   I discussed the limitations, risks, security and privacy concerns of performing an evaluation and management service by telephone and the availability of in person appointments. I also discussed with the patient that there may be a patient responsible charge related to this service. The patient expressed understanding and agreed to proceed.  Location patient: home Location provider: work office Participants present for the call: patient, provider Patient did not have a visit in the prior 7 days to address this/these issue(s).   History of Present Illness:  She has had left foot edema and erythema for 2 weeks. She initially thought it was her gout, so started taking colchicine daily. Last week, she notified us that she thought she may have a cellulitis (she is an Therapist, sports). As she has stage IV COPD, she has been reluctant to come into the office given the current COVID-19 pandemic. We empirically sent in a Rx for doxycycline for 10 days, of which she has 2 days remaining. She states her foot is "70% better", but still has concerns and pain seems to be increasing instead of decreasing. Unfortunately, she had technical difficulties and was unable to start her video so I was unable to visually inspect her foot. She denies fevers, chills or any other symptoms.   Observations/Objective: Patient sounds cheerful and well on the phone. I do not appreciate any increased work of breathing. Speech and thought processing are grossly intact. Patient reported vitals: none reported   Current Outpatient Medications:  .  albuterol (VENTOLIN HFA) 108 (90 Base) MCG/ACT inhaler, INHALE 2 PUFFS INTO THE LUNGS  EVERY 6 HOURS AS NEEDED FOR WHEEZING OR SHORTNESS OF BREATH, Disp: 54 each, Rfl: 4 .  ALPRAZolam (XANAX) 0.5 MG tablet, Take 1 tab three times per day as needed for nerves MAX DAILY DOSE = 3 TABLETS (Patient taking differently: 0.5 mg at bedtime as needed. ), Disp: 270 tablet, Rfl: 1 .  Apple Cider Vinegar 500 MG TABS, Take 1,000 mg by mouth daily., Disp: , Rfl:  .  Ascorbic Acid (VITAMIN C) 1000 MG tablet, Take 1,000 mg by mouth daily., Disp: , Rfl:  .  baclofen (LIORESAL) 10 MG tablet, Take 10 mg by mouth. Taking 1 nightly, Disp: , Rfl:  .  cetirizine (ZYRTEC) 10 MG tablet, Take 10 mg by mouth daily.  , Disp: , Rfl:  .  Cholecalciferol (VITAMIN D-3) 5000 UNITS TABS, Take 1 tablet by mouth daily. Per pt medication list, takes 1,000, Disp: , Rfl:  .  CINNAMON PO, Take 325 mg by mouth every morning. Takes 2 every am, Disp: , Rfl:  .  colchicine (COLCRYS) 0.6 MG tablet, Take 1 tablet (0.6 mg total) by mouth daily as needed., Disp: 30 tablet, Rfl: 3 .  COLCRYS 0.6 MG tablet, TAKE ONE TABLET BY MOUTH DAILY, Disp: 30 tablet, Rfl: 0 .  cyanocobalamin 2000 MCG tablet, Take 2,500 mcg by mouth 2 (two) times daily., Disp: , Rfl:  .  doxycycline (VIBRA-TABS) 100 MG tablet, Take 1 tablet (100 mg total) by mouth 2 (two) times daily., Disp: 20 tablet, Rfl: 0 .  fluticasone (FLONASE) 50 MCG/ACT nasal spray, Place 2 sprays into both nostrils daily as needed. , Disp: ,  Rfl:  .  furosemide (LASIX) 40 MG tablet, TAKE 1-2 TABLETS ONCE A DAY AS NEEDED FOR SWELLING (Patient taking differently: 80 mg. TAKE 1 tab daily), Disp: 60 tablet, Rfl: 11 .  glucosamine-chondroitin 500-400 MG tablet, Take 2 tablets by mouth 2 (two) times daily. , Disp: , Rfl:  .  guaiFENesin (MUCINEX) 600 MG 12 hr tablet, Take 400 mg by mouth 2 (two) times daily. Tales 3 tab;es twice a day, Disp: , Rfl:  .  ipratropium-albuterol (DUONEB) 0.5-2.5 (3) MG/3ML SOLN, Take 3 mLs by nebulization 3 (three) times daily. (Patient taking differently: Take 3 mLs  by nebulization 2 (two) times daily. ), Disp: 270 mL, Rfl: 7 .  loperamide (IMODIUM) 2 MG capsule, Take 1 capsule (2 mg total) by mouth 4 (four) times daily as needed for diarrhea or loose stools., Disp: 12 capsule, Rfl: 0 .  Magnesium 400 MG CAPS, Take 3 capsules by mouth 2 (two) times daily. , Disp: , Rfl:  .  Misc Natural Products (TART CHERRY ADVANCED PO), Take by mouth., Disp: , Rfl:  .  montelukast (SINGULAIR) 10 MG tablet, Take 1 tablet (10 mg total) by mouth at bedtime., Disp: 30 tablet, Rfl: 6 .  nystatin (MYCOSTATIN/NYSTOP) powder, Apply topically 4 (four) times daily., Disp: 15 g, Rfl: 0 .  OXYGEN, Inhale 4 L into the lungs., Disp: , Rfl:  .  Spacer/Aero-Holding Chambers (AEROCHAMBER MV) inhaler, Use as instructed, Disp: 1 each, Rfl: 0 .  SYMBICORT 160-4.5 MCG/ACT inhaler, INHALE 2 PUFFS INTO THE LUNGS 2 TIMES DAILY (Patient taking differently: Only taking 2 puffs once a day), Disp: 1 Inhaler, Rfl: 5 .  Tiotropium Bromide Monohydrate (SPIRIVA RESPIMAT) 2.5 MCG/ACT AERS, Inhale 2 puffs into the lungs daily., Disp: 1 Inhaler, Rfl: 0 .  traMADol (ULTRAM) 50 MG tablet, Take 1 tablet (50 mg total) by mouth 3 (three) times daily as needed for moderate pain., Disp: 90 tablet, Rfl: 3 .  TURMERIC PO, Take 500 mg by mouth 2 (two) times daily. Takes 1,000 mg, Disp: , Rfl:  .  vitamin A 10000 UNIT capsule, Take 5,000 Units by mouth daily. Per pt list - takes 25,000, Disp: , Rfl:   Review of Systems:  Constitutional: Denies fever, chills, diaphoresis, appetite change and fatigue.  HEENT: Denies photophobia, eye pain, redness, hearing loss, ear pain, congestion, sore throat, rhinorrhea, sneezing, mouth sores, trouble swallowing, neck pain, neck stiffness and tinnitus.   Respiratory: Denies SOB, DOE, cough, chest tightness,  and wheezing.   Cardiovascular: Denies chest pain, palpitations and leg swelling.  Gastrointestinal: Denies nausea, vomiting, abdominal pain, diarrhea, constipation, blood in  stool and abdominal distention.  Genitourinary: Denies dysuria, urgency, frequency, hematuria, flank pain and difficulty urinating.  Endocrine: Denies: hot or cold intolerance, sweats, changes in hair or nails, polyuria, polydipsia. Musculoskeletal: Denies myalgias, back pain,  Skin: states swelling and redness of left midfoot, ankle swelling is improved from 2 weeks ago. Neurological: Denies dizziness, seizures, syncope, weakness, light-headedness, numbness and headaches.  Hematological: Denies adenopathy. Easy bruising, personal or family bleeding history  Psychiatric/Behavioral: Denies suicidal ideation, mood changes, confusion, nervousness, sleep disturbance and agitation   Assessment and Plan:  Localized swelling of left foot -Difficult to make diagnosis without a hands-on exam or video. -from her descriptions sounds like a cellulitis that is improving while on abx therapy. -She will schedule another webex visit for next week so we can monitor for improvement.  -If worsening or we cannot connect to video, we may determine that it would  be beneficial for here to come in for a hands-on assessment.   I discussed the assessment and treatment plan with the patient. The patient was provided an opportunity to ask questions and all were answered. The patient agreed with the plan and demonstrated an understanding of the instructions.   The patient was advised to call back or seek an in-person evaluation if the symptoms worsen or if the condition fails to improve as anticipated.  I provided 18 minutes of non-face-to-face time during this encounter.   Lelon Frohlich, MD [facility] at [facility]

## 2018-05-31 ENCOUNTER — Ambulatory Visit (HOSPITAL_COMMUNITY): Payer: Self-pay

## 2018-06-01 ENCOUNTER — Ambulatory Visit: Payer: Self-pay | Admitting: Internal Medicine

## 2018-06-01 ENCOUNTER — Ambulatory Visit: Payer: Medicare HMO | Admitting: Internal Medicine

## 2018-06-02 ENCOUNTER — Telehealth: Payer: Self-pay | Admitting: Internal Medicine

## 2018-06-02 ENCOUNTER — Ambulatory Visit (HOSPITAL_COMMUNITY): Payer: Self-pay

## 2018-06-02 NOTE — Telephone Encounter (Signed)
Copied from [place] [phone] [phone]. Topic: Quick Communication - See Telephone Encounter >> Jun 02, 2018 11:44 AM Vernona Rieger wrote: CRM for notification. See Telephone encounter for: 06/02/18.  Patient said that Dr Jerilee Hoh increased her COLCRYS 0.6 MG tablet to taking 2 tablets a day and she is running low since then. She has four tablets left . She would like to know does she need to continue the 2 tablets or if not - Would Dr Jerilee Hoh refill it?

## 2018-06-02 NOTE — Telephone Encounter (Signed)
She can stop taking it if he foot is better

## 2018-06-02 NOTE — Telephone Encounter (Signed)
Spoke with patient and she states her foot is almost better.  She is down to 4 tabs of colcrys.  Would you like for her to only take 1 a day, stop taking it, or initiate a prior-authorization for a refill?

## 2018-06-02 NOTE — Telephone Encounter (Signed)
Patient is aware and a prior Rebecca [name] will be started.

## 2018-06-06 DIAGNOSIS — J441 Chronic obstructive pulmonary disease with (acute) exacerbation: Secondary | ICD-10-CM | POA: Diagnosis not present

## 2018-06-06 DIAGNOSIS — J449 Chronic obstructive pulmonary disease, unspecified: Secondary | ICD-10-CM | POA: Diagnosis not present

## 2018-06-06 DIAGNOSIS — M6080 Other myositis, unspecified site: Secondary | ICD-10-CM | POA: Diagnosis not present

## 2018-06-07 ENCOUNTER — Ambulatory Visit (HOSPITAL_COMMUNITY): Payer: Self-pay

## 2018-06-08 ENCOUNTER — Telehealth: Payer: Self-pay | Admitting: *Deleted

## 2018-06-08 NOTE — Telephone Encounter (Signed)
Prior auth for Colchicine 0.6mg  tablet sent to [email or web address] [id number].

## 2018-06-09 ENCOUNTER — Ambulatory Visit (HOSPITAL_COMMUNITY): Payer: Self-pay

## 2018-06-09 NOTE — Telephone Encounter (Signed)
Fax received from [facility] stating the covered formulary drug is Colcrys tablets (brand) and this was given to Dr Ledell Noss asst.

## 2018-06-09 NOTE — Telephone Encounter (Signed)
Okay to send in new prescription for Brand Name only?  Patient states that she likes to have some on hand for flare ups.

## 2018-06-11 NOTE — Telephone Encounter (Signed)
Yes

## 2018-06-13 ENCOUNTER — Telehealth: Payer: Self-pay | Admitting: *Deleted

## 2018-06-13 MED ORDER — COLCRYS 0.6 MG PO TABS: 0.6000 mg | ORAL_TABLET | Freq: Every day | ORAL | 1 refills | Status: DC

## 2018-06-13 NOTE — Telephone Encounter (Signed)
Copied from [id number] [phone] [id number]. Topic: Referral - Request for Referral >> Jun 13, 2018  1:08 PM Yvette Rack wrote: Has patient seen PCP for this complaint? yes  *If NO, is insurance requiring patient see PCP for this issue before PCP can refer them? Referral for which specialty: [facility]  Preferred provider/office: [place]  Phone # [phone] Reason for referral: to receive oxygen

## 2018-06-13 NOTE — Telephone Encounter (Signed)
Left message on machine for patient and new Rx sent.  Med list updated.

## 2018-06-14 ENCOUNTER — Telehealth (HOSPITAL_COMMUNITY): Payer: Self-pay

## 2018-06-14 ENCOUNTER — Ambulatory Visit (HOSPITAL_COMMUNITY): Payer: Self-pay

## 2018-06-15 NOTE — Telephone Encounter (Signed)
Left message on machine for patient to see if patient needs oxygen for home.  If so the order should come from her pulmonologist. CRM

## 2018-06-16 ENCOUNTER — Ambulatory Visit (HOSPITAL_COMMUNITY): Payer: Self-pay

## 2018-06-20 NOTE — Telephone Encounter (Signed)
Spoke to someone at [facility] and nothing is needed at this time from Dr Jerilee Hoh.  If the patient needs supplies she should inform [place]. Left detailed message on machine for patient.

## 2018-06-21 ENCOUNTER — Ambulatory Visit (HOSPITAL_COMMUNITY): Payer: Self-pay

## 2018-06-23 ENCOUNTER — Ambulatory Visit (HOSPITAL_COMMUNITY): Payer: Self-pay

## 2018-06-28 ENCOUNTER — Ambulatory Visit: Payer: Medicare HMO | Admitting: Internal Medicine

## 2018-06-28 ENCOUNTER — Ambulatory Visit (HOSPITAL_COMMUNITY): Payer: Self-pay

## 2018-06-30 ENCOUNTER — Ambulatory Visit (HOSPITAL_COMMUNITY): Payer: Self-pay

## 2018-07-05 ENCOUNTER — Ambulatory Visit (HOSPITAL_COMMUNITY): Payer: Self-pay

## 2018-07-05 ENCOUNTER — Other Ambulatory Visit: Payer: Self-pay | Admitting: Pulmonary Disease

## 2018-07-06 DIAGNOSIS — J449 Chronic obstructive pulmonary disease, unspecified: Secondary | ICD-10-CM | POA: Diagnosis not present

## 2018-07-06 DIAGNOSIS — J441 Chronic obstructive pulmonary disease with (acute) exacerbation: Secondary | ICD-10-CM | POA: Diagnosis not present

## 2018-07-06 DIAGNOSIS — M6080 Other myositis, unspecified site: Secondary | ICD-10-CM | POA: Diagnosis not present

## 2018-07-07 ENCOUNTER — Ambulatory Visit (HOSPITAL_COMMUNITY): Payer: Self-pay

## 2018-07-10 ENCOUNTER — Other Ambulatory Visit: Payer: Self-pay | Admitting: Pulmonary Disease

## 2018-07-12 ENCOUNTER — Telehealth (HOSPITAL_COMMUNITY): Payer: Self-pay | Admitting: *Deleted

## 2018-07-12 ENCOUNTER — Ambulatory Visit (HOSPITAL_COMMUNITY): Payer: Self-pay

## 2018-07-13 ENCOUNTER — Other Ambulatory Visit: Payer: Self-pay | Admitting: *Deleted

## 2018-07-13 MED ORDER — IPRATROPIUM-ALBUTEROL 0.5-2.5 (3) MG/3ML IN SOLN: RESPIRATORY_TRACT | 3 refills | Status: DC

## 2018-07-13 MED ORDER — FUROSEMIDE 40 MG PO TABS: ORAL_TABLET | ORAL | 0 refills | Status: DC

## 2018-07-14 ENCOUNTER — Ambulatory Visit (HOSPITAL_COMMUNITY): Payer: Self-pay

## 2018-07-19 ENCOUNTER — Ambulatory Visit (HOSPITAL_COMMUNITY): Payer: Self-pay

## 2018-07-21 ENCOUNTER — Ambulatory Visit (HOSPITAL_COMMUNITY): Payer: Self-pay

## 2018-07-23 DIAGNOSIS — M6080 Other myositis, unspecified site: Secondary | ICD-10-CM | POA: Diagnosis not present

## 2018-07-23 DIAGNOSIS — J449 Chronic obstructive pulmonary disease, unspecified: Secondary | ICD-10-CM | POA: Diagnosis not present

## 2018-07-23 DIAGNOSIS — J441 Chronic obstructive pulmonary disease with (acute) exacerbation: Secondary | ICD-10-CM | POA: Diagnosis not present

## 2018-07-26 ENCOUNTER — Ambulatory Visit (HOSPITAL_COMMUNITY): Payer: Self-pay

## 2018-07-27 ENCOUNTER — Other Ambulatory Visit: Payer: Self-pay

## 2018-07-27 ENCOUNTER — Ambulatory Visit (INDEPENDENT_AMBULATORY_CARE_PROVIDER_SITE_OTHER): Payer: Medicare HMO | Admitting: Internal Medicine

## 2018-07-27 DIAGNOSIS — A084 Viral intestinal infection, unspecified: Secondary | ICD-10-CM

## 2018-07-27 MED ORDER — ONDANSETRON 4 MG PO TBDP: 4.0000 mg | ORAL_TABLET | Freq: Three times a day (TID) | ORAL | 0 refills | Status: DC | PRN

## 2018-07-27 NOTE — Progress Notes (Signed)
Virtual Visit via Telephone Note  I connected with Rebecca [name] on 07/27/18 at  1:30 PM EDT by telephone and verified that I am speaking with the correct person using two identifiers.   I discussed the limitations, risks, security and privacy concerns of performing an evaluation and management service by telephone and the availability of in person appointments. I also discussed with the patient that there may be a patient responsible charge related to this service. The patient expressed understanding and agreed to proceed.  We initially attempted to connect via video chat but were unable to due to technical difficulties on the patient's end, so we converted this visit to a phone visit.   Location patient: home Location provider: work office Participants present for the call: patient, provider Patient did not have a visit in the prior 7 days to address this/these issue(s).   History of Present Illness:  She is here today to discuss acute GI issues.  1 week ago she ate leftover lasagna from the night before. That evening she had abdominal cramps, nausea and vomiting. Emesis consists of food contents. She has vomited around 6 times total. 4 days later she started having watery diarrhea, about 10-12 episodes total. She feels she is now improving, altho she still has some diarrhea and nausea. She has never had a fever and she has been checking religiously as she thought this might be COVID-19. T max was 98.8. She has noticed a high HR of around 130s.   Observations/Objective: Patient sounds cheerful and well on the phone. I do not appreciate any increased work of breathing. Speech and thought processing are grossly intact. Patient reported vitals: HR 130, O2 sats 94% RA (she is happy about this as she is usually on O2).   Current Outpatient Medications:  .  albuterol (VENTOLIN HFA) 108 (90 Base) MCG/ACT inhaler, INHALE 2 PUFFS INTO THE LUNGS EVERY 6 HOURS AS NEEDED FOR WHEEZING  OR SHORTNESS OF BREATH, Disp: 54 each, Rfl: 4 .  ALPRAZolam (XANAX) 0.5 MG tablet, Take 1 tab three times per day as needed for nerves MAX DAILY DOSE = 3 TABLETS (Patient taking differently: 0.5 mg at bedtime as needed. ), Disp: 270 tablet, Rfl: 1 .  Apple Cider Vinegar 500 MG TABS, Take 1,000 mg by mouth daily., Disp: , Rfl:  .  Ascorbic Acid (VITAMIN C) 1000 MG tablet, Take 1,000 mg by mouth daily., Disp: , Rfl:  .  baclofen (LIORESAL) 10 MG tablet, Take 10 mg by mouth. Taking 1 nightly, Disp: , Rfl:  .  cetirizine (ZYRTEC) 10 MG tablet, Take 10 mg by mouth daily.  , Disp: , Rfl:  .  Cholecalciferol (VITAMIN D-3) 5000 UNITS TABS, Take 1 tablet by mouth daily. Per pt medication list, takes 1,000, Disp: , Rfl:  .  CINNAMON PO, Take 325 mg by mouth every morning. Takes 2 every am, Disp: , Rfl:  .  COLCRYS 0.6 MG tablet, Take 1 tablet (0.6 mg total) by mouth daily., Disp: 30 tablet, Rfl: 1 .  cyanocobalamin 2000 MCG tablet, Take 2,500 mcg by mouth 2 (two) times daily., Disp: , Rfl:  .  doxycycline (VIBRA-TABS) 100 MG tablet, Take 1 tablet (100 mg total) by mouth 2 (two) times daily., Disp: 20 tablet, Rfl: 0 .  fluticasone (FLONASE) 50 MCG/ACT nasal spray, Place 2 sprays into both nostrils daily as needed. , Disp: , Rfl:  .  furosemide (LASIX) 40 MG tablet, TAKE 1 TO 2 TABLETS BY MOUTH DAILY  AS NEEDED FOR SWELLING, Disp: 180 tablet, Rfl: 0 .  glucosamine-chondroitin 500-400 MG tablet, Take 2 tablets by mouth 2 (two) times daily. , Disp: , Rfl:  .  guaiFENesin (MUCINEX) 600 MG 12 hr tablet, Take 400 mg by mouth 2 (two) times daily. Tales 3 tab;es twice a day, Disp: , Rfl:  .  ipratropium-albuterol (DUONEB) 0.5-2.5 (3) MG/3ML SOLN, INHALE 1 VIAL VIA NEBULIZATION 3 TIMES DAILY, Disp: 180 mL, Rfl: 3 .  loperamide (IMODIUM) 2 MG capsule, Take 1 capsule (2 mg total) by mouth 4 (four) times daily as needed for diarrhea or loose stools., Disp: 12 capsule, Rfl: 0 .  Magnesium 400 MG CAPS, Take 3 capsules by  mouth 2 (two) times daily. , Disp: , Rfl:  .  Misc Natural Products (TART CHERRY ADVANCED PO), Take by mouth., Disp: , Rfl:  .  montelukast (SINGULAIR) 10 MG tablet, Take 1 tablet (10 mg total) by mouth at bedtime., Disp: 30 tablet, Rfl: 6 .  nystatin (MYCOSTATIN/NYSTOP) powder, Apply topically 4 (four) times daily., Disp: 15 g, Rfl: 0 .  ondansetron (ZOFRAN ODT) 4 MG disintegrating tablet, Take 1 tablet (4 mg total) by mouth every 8 (eight) hours as needed for nausea or vomiting., Disp: 20 tablet, Rfl: 0 .  OXYGEN, Inhale 4 L into the lungs., Disp: , Rfl:  .  Spacer/Aero-Holding Chambers (AEROCHAMBER MV) inhaler, Use as instructed, Disp: 1 each, Rfl: 0 .  SYMBICORT 160-4.5 MCG/ACT inhaler, INHALE 2 PUFFS INTO THE LUNGS 2 TIMES DAILY (Patient taking differently: Only taking 2 puffs once a day), Disp: 1 Inhaler, Rfl: 5 .  Tiotropium Bromide Monohydrate (SPIRIVA RESPIMAT) 2.5 MCG/ACT AERS, Inhale 2 puffs into the lungs daily., Disp: 1 Inhaler, Rfl: 0 .  traMADol (ULTRAM) 50 MG tablet, Take 1 tablet (50 mg total) by mouth 3 (three) times daily as needed for moderate pain., Disp: 90 tablet, Rfl: 3 .  TURMERIC PO, Take 500 mg by mouth 2 (two) times daily. Takes 1,000 mg, Disp: , Rfl:  .  vitamin A 10000 UNIT capsule, Take 5,000 Units by mouth daily. Per pt list - takes 25,000, Disp: , Rfl:   Review of Systems:  Constitutional: Denies fever, chills, diaphoresis, appetite change and fatigue.  HEENT: Denies photophobia, eye pain, redness, hearing loss, ear pain, congestion, sore throat, rhinorrhea, sneezing, mouth sores, trouble swallowing, neck pain, neck stiffness and tinnitus.   Respiratory: Denies SOB, DOE, cough, chest tightness,  and wheezing.   Cardiovascular: Denies chest pain, palpitations and leg swelling.  Gastrointestinal: Denies  constipation, blood in stool. Genitourinary: Denies dysuria, urgency, frequency, hematuria, flank pain and difficulty urinating.  Endocrine: Denies: hot or cold  intolerance, sweats, changes in hair or nails, polyuria, polydipsia. Musculoskeletal: Denies myalgias, back pain, joint swelling, arthralgias and gait problem.  Skin: Denies pallor, rash and wound.  Neurological: Denies dizziness, seizures, syncope, weakness, light-headedness, numbness and headaches.  Hematological: Denies adenopathy. Easy bruising, personal or family bleeding history  Psychiatric/Behavioral: Denies suicidal ideation, mood changes, confusion, nervousness, sleep disturbance and agitation   Assessment and Plan:  Viral gastroenteritis -Symptoms are likely viral gastroenteritis or food poisoning. -She feels she is improving. -Rx for ODT zofran PRN sent. -She has been instructed to go to ED if she is unable to keep fluids down for IVF replacement. I am a bit concerned that her HR is reported in the 130s.   I discussed the assessment and treatment plan with the patient. The patient was provided an opportunity to ask questions and all  were answered. The patient agreed with the plan and demonstrated an understanding of the instructions.   The patient was advised to call back or seek an in-person evaluation if the symptoms worsen or if the condition fails to improve as anticipated.  I provided 23 minutes of non-face-to-face time during this encounter.   Lelon Frohlich, MD [facility] at [facility]

## 2018-07-28 ENCOUNTER — Telehealth (HOSPITAL_COMMUNITY): Payer: Self-pay | Admitting: *Deleted

## 2018-07-28 ENCOUNTER — Ambulatory Visit (HOSPITAL_COMMUNITY): Payer: Self-pay

## 2018-08-02 ENCOUNTER — Ambulatory Visit (HOSPITAL_COMMUNITY): Payer: Self-pay

## 2018-08-04 ENCOUNTER — Ambulatory Visit (HOSPITAL_COMMUNITY): Payer: Self-pay

## 2018-08-06 DIAGNOSIS — J441 Chronic obstructive pulmonary disease with (acute) exacerbation: Secondary | ICD-10-CM | POA: Diagnosis not present

## 2018-08-06 DIAGNOSIS — J449 Chronic obstructive pulmonary disease, unspecified: Secondary | ICD-10-CM | POA: Diagnosis not present

## 2018-08-06 DIAGNOSIS — M6080 Other myositis, unspecified site: Secondary | ICD-10-CM | POA: Diagnosis not present

## 2018-08-09 ENCOUNTER — Other Ambulatory Visit: Payer: Self-pay | Admitting: Student

## 2018-08-09 ENCOUNTER — Ambulatory Visit (HOSPITAL_COMMUNITY): Payer: Self-pay

## 2018-08-09 DIAGNOSIS — J849 Interstitial pulmonary disease, unspecified: Secondary | ICD-10-CM

## 2018-08-11 ENCOUNTER — Ambulatory Visit: Payer: Self-pay | Admitting: Pulmonary Disease

## 2018-08-11 ENCOUNTER — Ambulatory Visit (HOSPITAL_COMMUNITY): Payer: Self-pay

## 2018-08-16 DIAGNOSIS — J9611 Chronic respiratory failure with hypoxia: Secondary | ICD-10-CM | POA: Diagnosis not present

## 2018-08-16 DIAGNOSIS — J432 Centrilobular emphysema: Secondary | ICD-10-CM | POA: Diagnosis not present

## 2018-08-16 DIAGNOSIS — J449 Chronic obstructive pulmonary disease, unspecified: Secondary | ICD-10-CM | POA: Diagnosis not present

## 2018-08-18 ENCOUNTER — Telehealth: Payer: Self-pay | Admitting: *Deleted

## 2018-08-18 NOTE — Telephone Encounter (Signed)
Copied from [place] [id number]. Topic: General - Inquiry >> Aug 17, 2018 10:30 AM Mathis Bud wrote: Reason for CRM: patient states she did go to [facility] yesterday 6/16.  Patient said she did get approved zephyr valves.  Patient did say she needs to lose 40 pounds. She would like PCP or nurse to call back to see if there was a diet plan for her or to be referred somewhere else.  Call back # [phone]

## 2018-08-18 NOTE — Telephone Encounter (Signed)
Let's give her the [facility] info

## 2018-08-19 NOTE — Telephone Encounter (Signed)
Information mailed to home address

## 2018-09-01 DIAGNOSIS — N132 Hydronephrosis with renal and ureteral calculous obstruction: Secondary | ICD-10-CM | POA: Diagnosis not present

## 2018-09-01 DIAGNOSIS — N200 Calculus of kidney: Secondary | ICD-10-CM | POA: Diagnosis not present

## 2018-09-05 DIAGNOSIS — J441 Chronic obstructive pulmonary disease with (acute) exacerbation: Secondary | ICD-10-CM | POA: Diagnosis not present

## 2018-09-05 DIAGNOSIS — M6080 Other myositis, unspecified site: Secondary | ICD-10-CM | POA: Diagnosis not present

## 2018-09-05 DIAGNOSIS — J449 Chronic obstructive pulmonary disease, unspecified: Secondary | ICD-10-CM | POA: Diagnosis not present

## 2018-09-16 ENCOUNTER — Telehealth (HOSPITAL_COMMUNITY): Payer: Self-pay

## 2018-09-27 ENCOUNTER — Other Ambulatory Visit: Payer: Self-pay

## 2018-09-27 ENCOUNTER — Encounter (HOSPITAL_COMMUNITY)
Admission: RE | Admit: 2018-09-27 | Discharge: 2018-09-27 | Disposition: A | Discharge status: Home / Self Care | Payer: Medicare HMO | Source: Ambulatory Visit | Attending: Pulmonary Disease | Admitting: Pulmonary Disease

## 2018-09-27 VITALS — Wt 243.6 lb

## 2018-09-27 DIAGNOSIS — J449 Chronic obstructive pulmonary disease, unspecified: Secondary | ICD-10-CM

## 2018-09-27 NOTE — Progress Notes (Signed)
Daily Session Note  Patient Details  Name: Rebecca [name] MRN: [id number] Date of Birth: 1950/12/11 Referring Provider:     Pulmonary Rehab Walk Test from 05/10/2018 in [place]  Referring Provider  Dr. Valeta Harms      Encounter Date: 09/27/2018  Check In: Session Check In - 09/27/18 1300      Check-In   Supervising physician immediately available to respond to emergencies  [facility] immediately available    Physician(s)  Dr. Nevada Crane    Location  [facility]    Staff Present  Rosebud Poles, RN, Bjorn Loser, MS, Exercise Physiologist;Lisa Ysidro Evert, RN    Virtual Visit  No    Medication changes reported      No    Fall or balance concerns reported     No    Tobacco Cessation  No Change    Warm-up and Cool-down  Performed as group-led instruction    Resistance Training Performed  Yes    VAD Patient?  No    PAD/SET Patient?  No      Pain Assessment   Currently in Pain?  No/denies    Multiple Pain Sites  No       Capillary Blood Glucose: No results found for this or any previous visit (from the past 24 hour(s)).  Exercise Prescription Changes - 09/27/18 1500      Response to Exercise   Blood Pressure (Admit)  130/70    Blood Pressure (Exercise)  150/72    Blood Pressure (Exit)  118/70    Heart Rate (Admit)  87 bpm    Heart Rate (Exercise)  106 bpm    Heart Rate (Exit)  96 bpm    Oxygen Saturation (Admit)  96 %    Oxygen Saturation (Exercise)  95 %    Oxygen Saturation (Exit)  98 %    Rating of Perceived Exertion (Exercise)  12    Perceived Dyspnea (Exercise)  3    Duration  Continue with 30 min of aerobic exercise without signs/symptoms of physical distress.    Intensity  --   40-80% HRR     Progression   Progression  Continue to progress workloads to maintain intensity without signs/symptoms of physical distress.      Resistance Training   Training Prescription  Yes    Weight  orange bands    Reps   10-15    Time  10 Minutes      Interval Training   Interval Training  No      Oxygen   Oxygen  Continuous    Liters  4 L      NuStep   Level  2    SPM  80    Minutes  15    METs  1.6      Arm Ergometer   Level  1    Minutes  15       Social History   Tobacco Use  Smoking Status Former Smoker  . Packs/day: 1.00  . Years: 29.00  . Pack years: 29.00  . Types: Cigarettes  . Start date: 03/02/1962  . Quit date: 09/27/1991  . Years since quitting: 27.0  Smokeless Tobacco Never Used    Goals Met:  Proper associated with RPD/PD & O2 Sat Exercise tolerated well Strength training completed today  Goals Unmet:  Not Applicable  Comments: Service time is from 1300 to 1410    Dr. Rush Farmer is Medical Director  for Pulmonary Rehab at [facility].

## 2018-09-29 ENCOUNTER — Encounter (HOSPITAL_COMMUNITY)
Admission: RE | Admit: 2018-09-29 | Discharge: 2018-09-29 | Disposition: A | Discharge status: Home / Self Care | Payer: Medicare HMO | Source: Ambulatory Visit | Attending: Pulmonary Disease | Admitting: Pulmonary Disease

## 2018-09-29 ENCOUNTER — Other Ambulatory Visit: Payer: Self-pay

## 2018-09-29 DIAGNOSIS — J449 Chronic obstructive pulmonary disease, unspecified: Secondary | ICD-10-CM

## 2018-09-29 NOTE — Progress Notes (Addendum)
Daily Session Note  Patient Details  Name: Rebecca [name] MRN: [id number] Date of Birth: November 20, 1950 Referring Provider:     Pulmonary Rehab Walk Test from 05/10/2018 in [place]  Referring Provider  Dr. Valeta Harms      Encounter Date: 09/29/2018  Check In: Session Check In - 09/29/18 1245      Check-In   Physician(s)  Dr. Nevada Crane    Location  [facility]    Staff Present  Rosebud Poles, RN, Bjorn Loser, MS, Exercise Physiologist;Lisa Ysidro Evert, RN    Virtual Visit  No    Medication changes reported      No    Fall or balance concerns reported     No    Tobacco Cessation  No Change    Warm-up and Cool-down  Performed as group-led instruction    Resistance Training Performed  Yes    VAD Patient?  No    PAD/SET Patient?  No      Pain Assessment   Currently in Pain?  No/denies    Multiple Pain Sites  No       Capillary Blood Glucose: No results found for this or any previous visit (from the past 24 hour(s)).    Social History   Tobacco Use  Smoking Status Former Smoker  . Packs/day: 1.00  . Years: 29.00  . Pack years: 29.00  . Types: Cigarettes  . Start date: 03/02/1962  . Quit date: 09/27/1991  . Years since quitting: 27.0  Smokeless Tobacco Never Used    Goals Met:  Independence with exercise equipment Exercise tolerated well Strength training completed today  Goals Unmet:  Not Applicable  Comments: Service time is from 1245 to 1355.   Dr. Rush Farmer is Medical Director for Pulmonary Rehab at [facility].

## 2018-10-03 DIAGNOSIS — M6080 Other myositis, unspecified site: Secondary | ICD-10-CM | POA: Diagnosis not present

## 2018-10-03 DIAGNOSIS — J441 Chronic obstructive pulmonary disease with (acute) exacerbation: Secondary | ICD-10-CM | POA: Diagnosis not present

## 2018-10-03 DIAGNOSIS — J449 Chronic obstructive pulmonary disease, unspecified: Secondary | ICD-10-CM | POA: Diagnosis not present

## 2018-10-04 ENCOUNTER — Ambulatory Visit (HOSPITAL_COMMUNITY): Payer: Medicare HMO

## 2018-10-05 ENCOUNTER — Other Ambulatory Visit: Payer: Self-pay | Admitting: Internal Medicine

## 2018-10-05 ENCOUNTER — Other Ambulatory Visit: Payer: Self-pay | Admitting: *Deleted

## 2018-10-05 ENCOUNTER — Telehealth (HOSPITAL_COMMUNITY): Payer: Self-pay | Admitting: Internal Medicine

## 2018-10-05 ENCOUNTER — Telehealth (HOSPITAL_COMMUNITY): Payer: Self-pay | Admitting: *Deleted

## 2018-10-05 ENCOUNTER — Telehealth: Payer: Self-pay | Admitting: Internal Medicine

## 2018-10-05 DIAGNOSIS — F419 Anxiety disorder, unspecified: Secondary | ICD-10-CM

## 2018-10-05 MED ORDER — BUDESONIDE-FORMOTEROL FUMARATE 160-4.5 MCG/ACT IN AERO: INHALATION_SPRAY | RESPIRATORY_TRACT | 5 refills | Status: DC

## 2018-10-05 MED ORDER — ALPRAZOLAM 0.5 MG PO TABS: ORAL_TABLET | ORAL | 2 refills | Status: DC

## 2018-10-05 NOTE — Telephone Encounter (Signed)
MEDICATION:ALPRAZolam Duanne Moron) 0.5 MG tablet  PHARMACY: [facility] - [place], [place] - [phone] [address] [phone] (Phone) [phone] (Fax)    Patient has 10 pills left. Patient has appt on 10/06/18 for a virtual visit.

## 2018-10-05 NOTE — Telephone Encounter (Signed)
Sent!

## 2018-10-06 ENCOUNTER — Ambulatory Visit (HOSPITAL_COMMUNITY): Payer: Medicare HMO

## 2018-10-06 ENCOUNTER — Ambulatory Visit: Payer: Medicare HMO | Admitting: Internal Medicine

## 2018-10-06 DIAGNOSIS — J441 Chronic obstructive pulmonary disease with (acute) exacerbation: Secondary | ICD-10-CM | POA: Diagnosis not present

## 2018-10-06 DIAGNOSIS — M6080 Other myositis, unspecified site: Secondary | ICD-10-CM | POA: Diagnosis not present

## 2018-10-06 DIAGNOSIS — J449 Chronic obstructive pulmonary disease, unspecified: Secondary | ICD-10-CM | POA: Diagnosis not present

## 2018-10-07 ENCOUNTER — Telehealth (INDEPENDENT_AMBULATORY_CARE_PROVIDER_SITE_OTHER): Payer: Medicare HMO | Admitting: Internal Medicine

## 2018-10-07 ENCOUNTER — Other Ambulatory Visit: Payer: Self-pay

## 2018-10-07 ENCOUNTER — Telehealth: Payer: Self-pay | Admitting: Pulmonary Disease

## 2018-10-07 DIAGNOSIS — F339 Major depressive disorder, recurrent, unspecified: Secondary | ICD-10-CM | POA: Diagnosis not present

## 2018-10-07 DIAGNOSIS — J441 Chronic obstructive pulmonary disease with (acute) exacerbation: Secondary | ICD-10-CM | POA: Diagnosis not present

## 2018-10-07 DIAGNOSIS — F419 Anxiety disorder, unspecified: Secondary | ICD-10-CM

## 2018-10-07 MED ORDER — SERTRALINE HCL 50 MG PO TABS: 50.0000 mg | ORAL_TABLET | Freq: Every day | ORAL | 1 refills | Status: DC

## 2018-10-07 MED ORDER — PREDNISONE 10 MG (21) PO TBPK: ORAL_TABLET | ORAL | 0 refills | Status: DC

## 2018-10-07 MED ORDER — AZITHROMYCIN 250 MG PO TABS: ORAL_TABLET | ORAL | 0 refills | Status: DC

## 2018-10-07 NOTE — Progress Notes (Signed)
Virtual Visit via Video Note  I connected with Rebecca [name] on 10/07/18 at 11:30 AM EDT by a video enabled telemedicine application and verified that I am speaking with the correct person using two identifiers.  Location patient: home Location provider: work office Persons participating in the virtual visit: patient, provider  I discussed the limitations of evaluation and management by telemedicine and the availability of in person appointments. The patient expressed understanding and agreed to proceed.   HPI: She has scheduled this visit to discuss some acute concerns.  1.  She started pulmonary rehab last week.  She notices that since then she has been breathing hard, her oxygen levels have been lower than usual and are hanging around 87% even while resting.  She was having some yellow sputum production.  She does note severe anxiety while wearing a mask which is why she usually does not leave her house.  She has not had fever, chills, headache, loss of smell or taste.  2.  She feels very depressed.  She is quite tearful on video with me today.  She states she gets angry and snaps very easily, she has been very depressed about staying at home, she has a lot of stress.  She has a therapist that she sees twice a month and they have recommended starting antidepressant therapy.   ROS: Constitutional: Denies fever, chills, diaphoresis, appetite change and fatigue.  HEENT: Denies photophobia, eye pain, redness, hearing loss, ear pain, congestion, sore throat, rhinorrhea, sneezing, mouth sores, trouble swallowing, neck pain, neck stiffness and tinnitus.   Respiratory: Denies  chest tightness. Cardiovascular: Denies chest pain, palpitations and leg swelling.  Gastrointestinal: Denies nausea, vomiting, abdominal pain, diarrhea, constipation, blood in stool and abdominal distention.  Genitourinary: Denies dysuria, urgency, frequency, hematuria, flank pain and difficulty urinating.   Endocrine: Denies: hot or cold intolerance, sweats, changes in hair or nails, polyuria, polydipsia. Musculoskeletal: Denies myalgias, back pain, joint swelling, arthralgias and gait problem.  Skin: Denies pallor, rash and wound.  Neurological: Denies dizziness, seizures, syncope, weakness, light-headedness, numbness and headaches.  Hematological: Denies adenopathy. Easy bruising, personal or family bleeding history  Psychiatric/Behavioral: Denies suicidal ideation,  confusion, nervousness, sleep disturbance and agitation   Past Medical History:  Diagnosis Date  . Chest pain   . Chronic venous insufficiency   . COPD (chronic obstructive pulmonary disease) ([place])   . DJD (degenerative joint disease)   . Dysthymia   . Fibromyalgia   . GERD (gastroesophageal reflux disease)   . History of headache   . Hodgkin lymphoma of extranodal or solid organ site [facility])   . Hypothyroid   . IBS (irritable bowel syndrome)   . Metabolic syndrome X   . Morbid obesity ([place])   . OSA (obstructive sleep apnea)   . Vitamin D deficiency     Past Surgical History:  Procedure Laterality Date  . APPENDECTOMY    . BILATERAL SALPINGOOPHORECTOMY  2007   [facility]  . CHOLECYSTECTOMY  1982  . LAPAROSCOPIC GASTRIC BANDING  08/2007   Dr. Hassell Done  . LAPAROSCOPIC HYSTERECTOMY  2007   [facility]  . 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 Heart failure Father   . Cancer Mother   . Parkinson's disease Mother   . Heart attack Sister     SOCIAL HX:   reports that she quit smoking about 27 years ago. Her smoking use included cigarettes. She started  smoking about 56 years ago. She has a 29.00 pack-year smoking history. She has never used smokeless tobacco. She reports that she does not drink alcohol or use drugs.   Current Outpatient Medications:  .  albuterol (VENTOLIN HFA) 108 (90 Base) MCG/ACT inhaler, INHALE 2 PUFFS INTO THE LUNGS EVERY 6 HOURS AS NEEDED FOR  WHEEZING OR SHORTNESS OF BREATH, Disp: 54 each, Rfl: 4 .  ALPRAZolam (XANAX) 0.5 MG tablet, Take 1 tab three times per day as needed for nerves MAX DAILY DOSE = 3 TABLETS, Disp: 90 tablet, Rfl: 2 .  Apple Cider Vinegar 500 MG TABS, Take 1,000 mg by mouth daily., Disp: , Rfl:  .  Ascorbic Acid (VITAMIN C) 1000 MG tablet, Take 1,000 mg by mouth daily., Disp: , Rfl:  .  azithromycin (ZITHROMAX) 250 MG tablet, Take as directed, Disp: 6 tablet, Rfl: 0 .  baclofen (LIORESAL) 10 MG tablet, Take 10 mg by mouth. Taking 1 nightly, Disp: , Rfl:  .  budesonide-formoterol (SYMBICORT) 160-4.5 MCG/ACT inhaler, INHALE 2 PUFFS INTO THE LUNGS 2 TIMES DAILY, Disp: 1 Inhaler, Rfl: 5 .  cetirizine (ZYRTEC) 10 MG tablet, Take 10 mg by mouth daily.  , Disp: , Rfl:  .  Cholecalciferol (VITAMIN D-3) 5000 UNITS TABS, Take 1 tablet by mouth daily. Per pt medication list, takes 1,000, Disp: , Rfl:  .  CINNAMON PO, Take 325 mg by mouth every morning. Takes 2 every am, Disp: , Rfl:  .  COLCRYS 0.6 MG tablet, Take 1 tablet (0.6 mg total) by mouth daily., Disp: 30 tablet, Rfl: 1 .  cyanocobalamin 2000 MCG tablet, Take 2,500 mcg by mouth 2 (two) times daily., Disp: , Rfl:  .  doxycycline (VIBRA-TABS) 100 MG tablet, Take 1 tablet (100 mg total) by mouth 2 (two) times daily., Disp: 20 tablet, Rfl: 0 .  fluticasone (FLONASE) 50 MCG/ACT nasal spray, Place 2 sprays into both nostrils daily as needed. , Disp: , Rfl:  .  furosemide (LASIX) 40 MG tablet, TAKE 1 TO 2 TABLETS BY MOUTH DAILY AS NEEDED FOR SWELLING, Disp: 180 tablet, Rfl: 0 .  glucosamine-chondroitin 500-400 MG tablet, Take 2 tablets by mouth 2 (two) times daily. , Disp: , Rfl:  .  guaiFENesin (MUCINEX) 600 MG 12 hr tablet, Take 400 mg by mouth 2 (two) times daily. Tales 3 tab;es twice a day, Disp: , Rfl:  .  ipratropium-albuterol (DUONEB) 0.5-2.5 (3) MG/3ML SOLN, INHALE 1 VIAL VIA NEBULIZATION 3 TIMES DAILY, Disp: 180 mL, Rfl: 3 .  loperamide (IMODIUM) 2 MG capsule, Take 1  capsule (2 mg total) by mouth 4 (four) times daily as needed for diarrhea or loose stools., Disp: 12 capsule, Rfl: 0 .  Magnesium 400 MG CAPS, Take 3 capsules by mouth 2 (two) times daily. , Disp: , Rfl:  .  Misc Natural Products (TART CHERRY ADVANCED PO), Take by mouth., Disp: , Rfl:  .  montelukast (SINGULAIR) 10 MG tablet, Take 1 tablet (10 mg total) by mouth at bedtime., Disp: 30 tablet, Rfl: 6 .  nystatin (MYCOSTATIN/NYSTOP) powder, Apply topically 4 (four) times daily., Disp: 15 g, Rfl: 0 .  ondansetron (ZOFRAN ODT) 4 MG disintegrating tablet, Take 1 tablet (4 mg total) by mouth every 8 (eight) hours as needed for nausea or vomiting., Disp: 20 tablet, Rfl: 0 .  OXYGEN, Inhale 4 L into the lungs., Disp: , Rfl:  .  predniSONE (STERAPRED UNI-PAK 21 TAB) 10 MG (21) TBPK tablet, Take as directed, Disp: 21 tablet, Rfl: 0 .  sertraline (ZOLOFT) 50 MG tablet, Take 1 tablet (50 mg total) by mouth daily., Disp: 90 tablet, Rfl: 1 .  Spacer/Aero-Holding Chambers (AEROCHAMBER MV) inhaler, Use as instructed, Disp: 1 each, Rfl: 0 .  Tiotropium Bromide Monohydrate (SPIRIVA RESPIMAT) 2.5 MCG/ACT AERS, Inhale 2 puffs into the lungs daily., Disp: 1 Inhaler, Rfl: 0 .  traMADol (ULTRAM) 50 MG tablet, Take 1 tablet (50 mg total) by mouth 3 (three) times daily as needed for moderate pain., Disp: 90 tablet, Rfl: 3 .  TURMERIC PO, Take 500 mg by mouth 2 (two) times daily. Takes 1,000 mg, Disp: , Rfl:  .  vitamin A 10000 UNIT capsule, Take 5,000 Units by mouth daily. Per pt list - takes 25,000, Disp: , Rfl:   EXAM:   VITALS per patient if applicable: O2 sats of 32% on 2 L while resting  GENERAL: alert, oriented, appears tearful  HEENT: atraumatic, conjunttiva clear, no obvious abnormalities on inspection of external nose and ears  NECK: normal movements of the head and neck  LUNGS: on inspection no signs of respiratory distress, breathing rate appears normal, no obvious gross increased work of breathing,  gasping or wheezing  CV: no obvious cyanosis  MS: moves all visible extremities without noticeable abnormality  PSYCH/NEURO: pleasant and cooperative, speech and thought processing grossly intact, is tearful  ASSESSMENT AND PLAN:   COPD with exacerbation ([place])  -Based on her symptoms, I do suspect a mild acute COPD exacerbation. -Will start a 6-day prednisone taper, azithromycin for 6 days. -She has been advised to go to the emergency department over the weekend if her symptoms persist or worsen.  Depression, recurrent ([place])  Anxiety  -Will start Zoloft 50 mg daily, she will follow-up in 6 weeks.    I discussed the assessment and treatment plan with the patient. The patient was provided an opportunity to ask questions and all were answered. The patient agreed with the plan and demonstrated an understanding of the instructions.   The patient was advised to call back or seek an in-person evaluation if the symptoms worsen or if the condition fails to improve as anticipated.    Lelon Frohlich, MD  [facility]

## 2018-10-07 NOTE — Telephone Encounter (Signed)
PCCM: I am ok with the switch. Up to [place].  [facility] 10/07/2018 4:15 PM

## 2018-10-07 NOTE — Telephone Encounter (Signed)
Spoke with the pt  I explained our office protocol for switching physicians (getting both physicians approval) She stated that she disagreed with this protocol and would rather go elsewhere to seek pulmonary care from this point forward  Will forward to Dr. Valeta Harms to make him aware of this

## 2018-10-08 NOTE — Telephone Encounter (Signed)
I too am okay with switch.

## 2018-10-10 NOTE — Telephone Encounter (Signed)
Attempted to call pt but unable to reach. Left message for pt to return call.  When pt calls back, please schedule her a 82min appt with Dr. Halford Chessman as she is switching from Dr. Valeta Harms to Dr. Halford Chessman and due to the provider switch, the appt will need to be a 59min appt.Thanks!

## 2018-10-10 NOTE — Telephone Encounter (Signed)
LMTCB

## 2018-10-11 ENCOUNTER — Ambulatory Visit (HOSPITAL_COMMUNITY): Payer: Medicare HMO

## 2018-10-12 NOTE — Telephone Encounter (Signed)
Attempted to call pt but unable to reach. Left message for pt to return call. 

## 2018-10-13 ENCOUNTER — Ambulatory Visit (HOSPITAL_COMMUNITY): Payer: Medicare HMO

## 2018-10-13 NOTE — Telephone Encounter (Signed)
We have attempted to contact pt several times with no success or call back from pt. Per triage protocol, message will be closed.  

## 2018-10-14 ENCOUNTER — Telehealth: Payer: Self-pay | Admitting: *Deleted

## 2018-10-14 NOTE — Telephone Encounter (Signed)
Left message on machine for patient to schedule a follow up appointment.  Can be virtual.

## 2018-10-18 ENCOUNTER — Ambulatory Visit (HOSPITAL_COMMUNITY): Payer: Medicare HMO

## 2018-10-20 ENCOUNTER — Ambulatory Visit (HOSPITAL_COMMUNITY): Payer: Medicare HMO

## 2018-10-20 NOTE — Telephone Encounter (Signed)
Copied from [place] [id number]. Topic: General - Other >> Oct 20, 2018  2:32 PM Leward Quan A wrote: Reason for CRM: Patient request a call back from Dr Jerilee Hoh nurse so that she can discuss some medication including getting a refill on an antibiotic.  Can be reached at Ph# [phone]

## 2018-10-25 ENCOUNTER — Ambulatory Visit (HOSPITAL_COMMUNITY): Payer: Medicare HMO

## 2018-10-27 ENCOUNTER — Ambulatory Visit (HOSPITAL_COMMUNITY): Payer: Medicare HMO

## 2018-10-27 ENCOUNTER — Other Ambulatory Visit: Payer: Self-pay | Admitting: Internal Medicine

## 2018-10-27 DIAGNOSIS — J441 Chronic obstructive pulmonary disease with (acute) exacerbation: Secondary | ICD-10-CM

## 2018-11-01 ENCOUNTER — Ambulatory Visit (HOSPITAL_COMMUNITY): Payer: Medicare HMO

## 2018-11-03 ENCOUNTER — Ambulatory Visit (HOSPITAL_COMMUNITY): Payer: Medicare HMO

## 2018-11-06 DIAGNOSIS — J441 Chronic obstructive pulmonary disease with (acute) exacerbation: Secondary | ICD-10-CM | POA: Diagnosis not present

## 2018-11-06 DIAGNOSIS — M6080 Other myositis, unspecified site: Secondary | ICD-10-CM | POA: Diagnosis not present

## 2018-11-06 DIAGNOSIS — J449 Chronic obstructive pulmonary disease, unspecified: Secondary | ICD-10-CM | POA: Diagnosis not present

## 2018-11-08 ENCOUNTER — Ambulatory Visit (HOSPITAL_COMMUNITY): Payer: Medicare HMO

## 2018-11-10 ENCOUNTER — Ambulatory Visit (HOSPITAL_COMMUNITY): Payer: Medicare HMO

## 2018-11-15 ENCOUNTER — Ambulatory Visit (HOSPITAL_COMMUNITY): Payer: Medicare HMO

## 2018-11-17 ENCOUNTER — Ambulatory Visit (HOSPITAL_COMMUNITY): Payer: Medicare HMO

## 2018-11-18 ENCOUNTER — Ambulatory Visit (INDEPENDENT_AMBULATORY_CARE_PROVIDER_SITE_OTHER): Payer: Medicare HMO | Admitting: Internal Medicine

## 2018-11-18 ENCOUNTER — Telehealth: Payer: Self-pay | Admitting: Internal Medicine

## 2018-11-18 ENCOUNTER — Other Ambulatory Visit: Payer: Self-pay | Admitting: Internal Medicine

## 2018-11-18 ENCOUNTER — Other Ambulatory Visit: Payer: Self-pay

## 2018-11-18 ENCOUNTER — Ambulatory Visit: Payer: Medicare HMO | Admitting: Internal Medicine

## 2018-11-18 DIAGNOSIS — J449 Chronic obstructive pulmonary disease, unspecified: Secondary | ICD-10-CM | POA: Diagnosis not present

## 2018-11-18 DIAGNOSIS — F339 Major depressive disorder, recurrent, unspecified: Secondary | ICD-10-CM | POA: Diagnosis not present

## 2018-11-18 DIAGNOSIS — J441 Chronic obstructive pulmonary disease with (acute) exacerbation: Secondary | ICD-10-CM

## 2018-11-18 DIAGNOSIS — R252 Cramp and spasm: Secondary | ICD-10-CM | POA: Diagnosis not present

## 2018-11-18 DIAGNOSIS — M7022 Olecranon bursitis, left elbow: Secondary | ICD-10-CM | POA: Diagnosis not present

## 2018-11-18 MED ORDER — AZITHROMYCIN 250 MG PO TABS: ORAL_TABLET | ORAL | 0 refills | Status: DC

## 2018-11-18 MED ORDER — SPIRIVA RESPIMAT 2.5 MCG/ACT IN AERS: 2.0000 | INHALATION_SPRAY | Freq: Every day | RESPIRATORY_TRACT | 11 refills | Status: DC

## 2018-11-18 MED ORDER — ESCITALOPRAM OXALATE 10 MG PO TABS: 10.0000 mg | ORAL_TABLET | Freq: Every day | ORAL | 1 refills | Status: DC

## 2018-11-18 NOTE — Progress Notes (Signed)
Virtual Visit via Video Note  I connected with Carlyon Shadow on 11/18/18 at  4:30 PM EDT by a video enabled telemedicine application and verified that I am speaking with the correct person using two identifiers.  Location patient: home Location provider: work office Persons participating in the virtual visit: patient, provider  I discussed the limitations of evaluation and management by telemedicine and the availability of in person appointments. The patient expressed understanding and agreed to proceed.   HPI: This visit has been scheduled mainly as a follow-up after starting an antidepressant 6 weeks ago but she also has some acute complaints.  1.  She was started on 50 mg of Zoloft on 8/7.  She feels like her mood has improved drastically however she has noticed that she has had more difficulty sleeping since starting Zoloft and has been cutting dose in half to 25 mg which helps with the insomnia but she feels again more depressed.  She would like to try different medication.  2.  She has been treated in the past for right olecranon bursitis and septic bursitis.  She now has left olecranon bursitis and is wondering what to do.  3.  She has advanced COPD and has frequent exacerbations.  When she last had an exacerbation had difficulty getting in to see me for prescription for a Z-Pak.  Is wondering if there is any way that we can leave an open prescription at the pharmacy for her to fill as needed.  4.  She needs her Spiriva refilled.  5.  She has been having hand cramps.  She would like to have lab work updated.   ROS: Constitutional: Denies fever, chills, diaphoresis, appetite change and fatigue.  HEENT: Denies photophobia, eye pain, redness, hearing loss, ear pain, congestion, sore throat, rhinorrhea, sneezing, mouth sores, trouble swallowing, neck pain, neck stiffness and tinnitus.   Respiratory: Denies SOB, DOE, cough, chest tightness,  and wheezing.   Cardiovascular:  Denies chest pain, palpitations and leg swelling.  Gastrointestinal: Denies nausea, vomiting, abdominal pain, diarrhea, constipation, blood in stool and abdominal distention.  Genitourinary: Denies dysuria, urgency, frequency, hematuria, flank pain and difficulty urinating.  Endocrine: Denies: hot or cold intolerance, sweats, changes in hair or nails, polyuria, polydipsia. Musculoskeletal: Denies myalgias, back pain, joint swelling, arthralgias and gait problem.  Skin: Denies pallor, rash and wound.  Neurological: Denies dizziness, seizures, syncope, weakness, light-headedness, numbness and headaches.  Hematological: Denies adenopathy. Easy bruising, personal or family bleeding history  Psychiatric/Behavioral: Denies suicidal ideation, mood changes, confusion, nervousness, sleep disturbance and agitation   Past Medical History:  Diagnosis Date  . Chest pain   . Chronic venous insufficiency   . COPD (chronic obstructive pulmonary disease) ([place])   . DJD (degenerative joint disease)   . Dysthymia   . Fibromyalgia   . GERD (gastroesophageal reflux disease)   . History of headache   . Hodgkin lymphoma of extranodal or solid organ site [facility])   . Hypothyroid   . IBS (irritable bowel syndrome)   . Metabolic syndrome X   . Morbid obesity ([place])   . OSA (obstructive sleep apnea)   . Vitamin D deficiency     Past Surgical History:  Procedure Laterality Date  . APPENDECTOMY    . BILATERAL SALPINGOOPHORECTOMY  2007   [facility]  . CHOLECYSTECTOMY  1982  . LAPAROSCOPIC GASTRIC BANDING  08/2007   Dr. Hassell Done  . LAPAROSCOPIC HYSTERECTOMY  2007   [facility]  . SPLENECTOMY  at g[facility] 8  d/t trauma    Family History  Problem Relation Age of Onset  . Heart failure Father   . Cancer Mother   . Parkinson's disease Mother   . Heart attack Sister     SOCIAL HX:   reports that she quit smoking about 27 years ago. Her smoking use included cigarettes. She started smoking about 56 years ago. She has  a 29.00 pack-year smoking history. She has never used smokeless tobacco. She reports that she does not drink alcohol or use drugs.   Current Outpatient Medications:  .  albuterol (VENTOLIN HFA) 108 (90 Base) MCG/ACT inhaler, INHALE 2 PUFFS INTO THE LUNGS EVERY 6 HOURS AS NEEDED FOR WHEEZING OR SHORTNESS OF BREATH, Disp: 54 each, Rfl: 4 .  ALPRAZolam (XANAX) 0.5 MG tablet, Take 1 tab three times per day as needed for nerves MAX DAILY DOSE = 3 TABLETS, Disp: 90 tablet, Rfl: 2 .  Apple Cider Vinegar 500 MG TABS, Take 1,000 mg by mouth daily., Disp: , Rfl:  .  Ascorbic Acid (VITAMIN C) 1000 MG tablet, Take 1,000 mg by mouth daily., Disp: , Rfl:  .  azithromycin (ZITHROMAX) 250 MG tablet, Use as directed, Disp: 6 tablet, Rfl: 0 .  baclofen (LIORESAL) 10 MG tablet, Take 10 mg by mouth. Taking 1 nightly, Disp: , Rfl:  .  budesonide-formoterol (SYMBICORT) 160-4.5 MCG/ACT inhaler, INHALE 2 PUFFS INTO THE LUNGS 2 TIMES DAILY, Disp: 1 Inhaler, Rfl: 5 .  cetirizine (ZYRTEC) 10 MG tablet, Take 10 mg by mouth daily.  , Disp: , Rfl:  .  Cholecalciferol (VITAMIN D-3) 5000 UNITS TABS, Take 1 tablet by mouth daily. Per pt medication list, takes 1,000, Disp: , Rfl:  .  CINNAMON PO, Take 325 mg by mouth every morning. Takes 2 every am, Disp: , Rfl:  .  COLCRYS 0.6 MG tablet, Take 1 tablet (0.6 mg total) by mouth daily., Disp: 30 tablet, Rfl: 1 .  cyanocobalamin 2000 MCG tablet, Take 2,500 mcg by mouth 2 (two) times daily., Disp: , Rfl:  .  doxycycline (VIBRA-TABS) 100 MG tablet, Take 1 tablet (100 mg total) by mouth 2 (two) times daily., Disp: 20 tablet, Rfl: 0 .  escitalopram (LEXAPRO) 10 MG tablet, Take 1 tablet (10 mg total) by mouth daily., Disp: 90 tablet, Rfl: 1 .  fluticasone (FLONASE) 50 MCG/ACT nasal spray, Place 2 sprays into both nostrils daily as needed. , Disp: , Rfl:  .  furosemide (LASIX) 40 MG tablet, TAKE 1 TO 2 TABLETS BY MOUTH DAILY AS NEEDED FOR SWELLING, Disp: 180 tablet, Rfl: 0 .   glucosamine-chondroitin 500-400 MG tablet, Take 2 tablets by mouth 2 (two) times daily. , Disp: , Rfl:  .  guaiFENesin (MUCINEX) 600 MG 12 hr tablet, Take 400 mg by mouth 2 (two) times daily. Tales 3 tab;es twice a day, Disp: , Rfl:  .  ipratropium-albuterol (DUONEB) 0.5-2.5 (3) MG/3ML SOLN, INHALE 1 VIAL VIA NEBULIZATION 3 TIMES DAILY, Disp: 180 mL, Rfl: 3 .  loperamide (IMODIUM) 2 MG capsule, Take 1 capsule (2 mg total) by mouth 4 (four) times daily as needed for diarrhea or loose stools., Disp: 12 capsule, Rfl: 0 .  Magnesium 400 MG CAPS, Take 3 capsules by mouth 2 (two) times daily. , Disp: , Rfl:  .  Misc Natural Products (TART CHERRY ADVANCED PO), Take by mouth., Disp: , Rfl:  .  montelukast (SINGULAIR) 10 MG tablet, Take 1 tablet (10 mg total) by mouth at bedtime., Disp: 30 tablet, Rfl: 6 .  nystatin (MYCOSTATIN/NYSTOP) powder, Apply topically 4 (four) times daily., Disp: 15 g, Rfl: 0 .  ondansetron (ZOFRAN ODT) 4 MG disintegrating tablet, Take 1 tablet (4 mg total) by mouth every 8 (eight) hours as needed for nausea or vomiting., Disp: 20 tablet, Rfl: 0 .  OXYGEN, Inhale 4 L into the lungs., Disp: , Rfl:  .  predniSONE (STERAPRED UNI-PAK 21 TAB) 10 MG (21) TBPK tablet, Take as directed, Disp: 21 tablet, Rfl: 0 .  Spacer/Aero-Holding Chambers (AEROCHAMBER MV) inhaler, Use as instructed, Disp: 1 each, Rfl: 0 .  Tiotropium Bromide Monohydrate (SPIRIVA RESPIMAT) 2.5 MCG/ACT AERS, Inhale 2 puffs into the lungs daily., Disp: 1 g, Rfl: 11 .  traMADol (ULTRAM) 50 MG tablet, Take 1 tablet (50 mg total) by mouth 3 (three) times daily as needed for moderate pain., Disp: 90 tablet, Rfl: 3 .  TURMERIC PO, Take 500 mg by mouth 2 (two) times daily. Takes 1,000 mg, Disp: , Rfl:  .  vitamin A 10000 UNIT capsule, Take 5,000 Units by mouth daily. Per pt list - takes 25,000, Disp: , Rfl:   EXAM:   VITALS per patient if applicable: None reported  GENERAL: alert, oriented, appears well and in no acute  distress  HEENT: atraumatic, conjunttiva clear, no obvious abnormalities on inspection of external nose and ears  NECK: normal movements of the head and neck  LUNGS: on inspection no signs of respiratory distress, breathing rate appears normal, no obvious gross increased work of breathing, gasping or wheezing  CV: no obvious cyanosis  MS: moves all visible extremities without noticeable abnormality  PSYCH/NEURO: pleasant and cooperative, no obvious depression or anxiety, speech and thought processing grossly intact  ASSESSMENT AND PLAN:   Depression, recurrent ([place])  -Will try switching over to Lexapro 10 mg daily.  I am not sure that Zoloft is the cause of her insomnia.  Olecranon bursitis, left elbow -Does not appear to be septic.  As before, she has been advised to use a padded elbow brace, ice and PRN ibuprofen or other NSAID choice.  Hand cramps -She will come in for blood work to assess electrolytes and kidney function.  COPD mixed type ([place] -Will refill Spiriva today and leave an open prescription for a Z-Pak for her to fill as needed.    I discussed the assessment and treatment plan with the patient. The patient was provided an opportunity to ask questions and all were answered. The patient agreed with the plan and demonstrated an understanding of the instructions.   The patient was advised to call back or seek an in-person evaluation if the symptoms worsen or if the condition fails to improve as anticipated.    Lelon Frohlich, MD  [facility] at [facility]

## 2018-11-18 NOTE — Telephone Encounter (Signed)
error 

## 2018-11-22 ENCOUNTER — Ambulatory Visit (HOSPITAL_COMMUNITY): Payer: Medicare HMO

## 2018-11-24 ENCOUNTER — Ambulatory Visit (HOSPITAL_COMMUNITY): Payer: Medicare HMO

## 2018-12-06 DIAGNOSIS — M6080 Other myositis, unspecified site: Secondary | ICD-10-CM | POA: Diagnosis not present

## 2018-12-06 DIAGNOSIS — J441 Chronic obstructive pulmonary disease with (acute) exacerbation: Secondary | ICD-10-CM | POA: Diagnosis not present

## 2018-12-06 DIAGNOSIS — J449 Chronic obstructive pulmonary disease, unspecified: Secondary | ICD-10-CM | POA: Diagnosis not present

## 2018-12-07 DIAGNOSIS — E669 Obesity, unspecified: Secondary | ICD-10-CM | POA: Diagnosis not present

## 2018-12-07 DIAGNOSIS — R0602 Shortness of breath: Secondary | ICD-10-CM | POA: Diagnosis not present

## 2018-12-07 DIAGNOSIS — J449 Chronic obstructive pulmonary disease, unspecified: Secondary | ICD-10-CM | POA: Diagnosis not present

## 2018-12-07 DIAGNOSIS — E039 Hypothyroidism, unspecified: Secondary | ICD-10-CM | POA: Diagnosis not present

## 2018-12-08 NOTE — Addendum Note (Signed)
Encounter addended by: Lance Morin, RN on: 12/08/2018 4:21 PM  Actions taken: Clinical Note Signed, Episode resolved

## 2018-12-08 NOTE — Progress Notes (Signed)
Discharge Progress Report  Patient Details  Name: Rebecca [name] MRN: [id number] Date of Birth: [birth year] Referring Provider:     Pulmonary Rehab Walk Test from 05/10/2018 in [place]  Referring Provider  Dr. Valeta Harms       Number of Visits: 2  Reason for Discharge:  Early Exit:  patient found it extremely difficult to exercise while wearing a mask and was unable to participate  Smoking History:  Social History   Tobacco Use  Smoking Status Former Smoker  . Packs/day: 1.00  . Years: 29.00  . Pack years: 29.00  . Types: Cigarettes  . Start date: 03/02/1962  . Quit date: 09/27/1991  . 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)  ADL UCSD:   Initial Exercise Prescription:   Discharge Exercise Prescription (Final Exercise Prescription Changes): Exercise Prescription Changes - 09/27/18 1500      Response to Exercise   Blood Pressure (Admit)  130/70    Blood Pressure (Exercise)  150/72    Blood Pressure (Exit)  118/70    Heart Rate (Admit)  87 bpm    Heart Rate (Exercise)  106 bpm    Heart Rate (Exit)  96 bpm    Oxygen Saturation (Admit)  96 %    Oxygen Saturation (Exercise)  95 %    Oxygen Saturation (Exit)  98 %    Rating of Perceived Exertion (Exercise)  12    Perceived Dyspnea (Exercise)  3    Duration  Continue with 30 min of aerobic exercise without signs/symptoms of physical distress.    Intensity  --   40-80% HRR     Progression   Progression  Continue to progress workloads to maintain intensity without signs/symptoms of physical distress.      Resistance Training   Training Prescription  Yes    Weight  orange bands    Reps  10-15    Time  10 Minutes      Interval Training   Interval Training  No      Oxygen   Oxygen  Continuous    Liters  4 L      NuStep   Level  2    SPM  80    Minutes  15    METs  1.6      Arm  Ergometer   Level  1    Minutes  15       Functional Capacity:   Psychological, QOL, Others - Outcomes: PHQ 2/9: Depression screen [facility] 2/9 04/25/2018 03/08/2018 08/10/2011  Decreased Interest 0 0 1  Down, Depressed, Hopeless 2 0 1  PHQ - 2 Score 2 0 2  Altered sleeping 1 0 -  Tired, decreased energy 1 0 -  Change in appetite 0 0 -  Feeling bad or failure about yourself  3 0 -  Trouble concentrating 0 0 -  Moving slowly or fidgety/restless 0 0 -  Suicidal thoughts 0 0 -  PHQ-9 Score 7 0 -  Difficult doing work/chores Very difficult - -  Some recent data might be hidden    Quality of Life:   Personal Goals: Goals established at orientation with interventions provided to work toward goal.    Personal Goals Discharge:   Exercise Goals and Review:   Exercise Goals Re-Evaluation:   Nutrition & Weight - Outcomes:    Nutrition:   Nutrition Discharge:  Education Questionnaire Score:   Goals reviewed with patient; copy given to patient.

## 2018-12-21 ENCOUNTER — Other Ambulatory Visit: Payer: Self-pay | Admitting: Internal Medicine

## 2018-12-29 ENCOUNTER — Other Ambulatory Visit: Payer: Self-pay | Admitting: Internal Medicine

## 2018-12-29 ENCOUNTER — Telehealth: Payer: Self-pay | Admitting: *Deleted

## 2018-12-29 DIAGNOSIS — R7302 Impaired glucose tolerance (oral): Secondary | ICD-10-CM

## 2018-12-29 NOTE — Telephone Encounter (Signed)
If they can draw it fine by me. Orders are in. They need to be done fasting.

## 2018-12-29 NOTE — Telephone Encounter (Signed)
Copied from [place] [phone]. Topic: General - Other >> Dec 28, 2018  4:06 PM Leward Quan A wrote: Reason for CRM: Patient called to say that she will be going to see her Pulmonologist on 01/11/2019 and want to know if she could just get her blood work done at that time. She also states that she want to do this because she can not wear the mask. Patient is asking for a call back from the nurse at Ph#  [phone]

## 2018-12-29 NOTE — Telephone Encounter (Signed)
Left detailed message on machine for patient with Dr Ledell Noss recommendation.

## 2019-01-01 ENCOUNTER — Other Ambulatory Visit: Payer: Self-pay | Admitting: Internal Medicine

## 2019-01-06 DIAGNOSIS — J449 Chronic obstructive pulmonary disease, unspecified: Secondary | ICD-10-CM | POA: Diagnosis not present

## 2019-01-06 DIAGNOSIS — M6080 Other myositis, unspecified site: Secondary | ICD-10-CM | POA: Diagnosis not present

## 2019-01-06 DIAGNOSIS — J441 Chronic obstructive pulmonary disease with (acute) exacerbation: Secondary | ICD-10-CM | POA: Diagnosis not present

## 2019-01-11 DIAGNOSIS — R9389 Abnormal findings on diagnostic imaging of other specified body structures: Secondary | ICD-10-CM | POA: Diagnosis not present

## 2019-01-11 DIAGNOSIS — J449 Chronic obstructive pulmonary disease, unspecified: Secondary | ICD-10-CM | POA: Diagnosis not present

## 2019-01-11 DIAGNOSIS — Z7189 Other specified counseling: Secondary | ICD-10-CM | POA: Diagnosis not present

## 2019-01-20 ENCOUNTER — Encounter: Payer: Self-pay | Admitting: Internal Medicine

## 2019-01-20 ENCOUNTER — Other Ambulatory Visit: Payer: Self-pay

## 2019-01-20 ENCOUNTER — Telehealth (INDEPENDENT_AMBULATORY_CARE_PROVIDER_SITE_OTHER): Payer: Medicare HMO | Admitting: Internal Medicine

## 2019-01-20 VITALS — BP 140/70 | Wt 242.8 lb

## 2019-01-20 DIAGNOSIS — J449 Chronic obstructive pulmonary disease, unspecified: Secondary | ICD-10-CM

## 2019-01-20 MED ORDER — AZITHROMYCIN 250 MG PO TABS: ORAL_TABLET | ORAL | 0 refills | Status: DC

## 2019-01-20 NOTE — Progress Notes (Signed)
Virtual Visit via Video Note  I connected with Rebecca [name] on 01/20/19 at  1:00 PM EST by a video enabled telemedicine application and verified that I am speaking with the correct person using two identifiers.  Location patient: home Location provider: work office Persons participating in the virtual visit: patient, provider  I discussed the limitations of evaluation and management by telemedicine and the availability of in person appointments. The patient expressed understanding and agreed to proceed.   HPI: She has scheduled this appointment as she would like a refill of her zpak that she keeps on hand in case of an emergency. She also has emergency prednisone. She feels like she has a COPD exacerbation every time she attempts to wear a mask. So, she has not left her house since March (due to the pandemic) except for doctor's appointments. 2 weeks ago she used the last of her zpak after an exacerbation she feels was brought on by wearing a mask while going to visit her pulmonologist. She feels improved. Sats are 92% while resting not on O2 during out visit.   ROS: Constitutional: Denies fever, chills, diaphoresis, appetite change and fatigue.  HEENT: Denies photophobia, eye pain, redness, hearing loss, ear pain, congestion, sore throat, rhinorrhea, sneezing, mouth sores, trouble swallowing, neck pain, neck stiffness and tinnitus.   Respiratory: Denies SOB, DOE, cough, chest tightness,  and wheezing.   Cardiovascular: Denies chest pain, palpitations and leg swelling.  Gastrointestinal: Denies nausea, vomiting, abdominal pain, diarrhea, constipation, blood in stool and abdominal distention.  Genitourinary: Denies dysuria, urgency, frequency, hematuria, flank pain and difficulty urinating.  Endocrine: Denies: hot or cold intolerance, sweats, changes in hair or nails, polyuria, polydipsia. Musculoskeletal: Denies myalgias, back pain, joint swelling, arthralgias and gait problem.   Skin: Denies pallor, rash and wound.  Neurological: Denies dizziness, seizures, syncope, weakness, light-headedness, numbness and headaches.  Hematological: Denies adenopathy. Easy bruising, personal or family bleeding history  Psychiatric/Behavioral: Denies suicidal ideation, mood changes, confusion, nervousness, sleep disturbance and agitation   Past Medical History:  Diagnosis Date  . Chest pain   . Chronic venous insufficiency   . COPD (chronic obstructive pulmonary disease) ([place])   . DJD (degenerative joint disease)   . Dysthymia   . Fibromyalgia   . GERD (gastroesophageal reflux disease)   . History of headache   . Hodgkin lymphoma of extranodal or solid organ site [facility])   . Hypothyroid   . IBS (irritable bowel syndrome)   . Metabolic syndrome X   . Morbid obesity ([place])   . OSA (obstructive sleep apnea)   . Vitamin D deficiency     Past Surgical History:  Procedure Laterality Date  . APPENDECTOMY    . BILATERAL SALPINGOOPHORECTOMY  2007   [facility]  . CHOLECYSTECTOMY  1982  . LAPAROSCOPIC GASTRIC BANDING  08/2007   Dr. Hassell Done  . LAPAROSCOPIC HYSTERECTOMY  2007   [facility]  . 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 Heart failure Father   . Cancer Mother   . Parkinson's disease Mother   . Heart attack Sister     SOCIAL HX:   reports that she quit smoking about 27 years ago. Her smoking use included cigarettes. She started smoking about 56 years ago. She has a 29.00 pack-year smoking history. She has never used smokeless tobacco. She reports that she does not drink alcohol or use drugs.   Current Outpatient Medications:  .  albuterol (VENTOLIN HFA) 108 (90 Base) MCG/ACT inhaler, INHALE 2 PUFFS INTO THE LUNGS EVERY 6 HOURS AS NEEDED FOR WHEEZING OR SHORTNESS OF BREATH, Disp: 54 each, Rfl: 4 .  ALPRAZolam (XANAX) 0.5 MG tablet, Take 1 tab three times per day as needed for nerves MAX DAILY DOSE = 3 TABLETS, Disp: 90  tablet, Rfl: 2 .  Apple Cider Vinegar 500 MG TABS, Take 1,000 mg by mouth daily., Disp: , Rfl:  .  Ascorbic Acid (VITAMIN C) 1000 MG tablet, Take 1,000 mg by mouth daily., Disp: , Rfl:  .  azithromycin (ZITHROMAX) 250 MG tablet, Use as directed, Disp: 6 tablet, Rfl: 0 .  baclofen (LIORESAL) 10 MG tablet, Take 10 mg by mouth. Taking 1 nightly, Disp: , Rfl:  .  budesonide-formoterol (SYMBICORT) 160-4.5 MCG/ACT inhaler, INHALE 2 PUFFS INTO THE LUNGS 2 TIMES DAILY, Disp: 1 Inhaler, Rfl: 5 .  cetirizine (ZYRTEC) 10 MG tablet, Take 10 mg by mouth daily.  , Disp: , Rfl:  .  Cholecalciferol (VITAMIN D-3) 5000 UNITS TABS, Take 1 tablet by mouth daily. Per pt medication list, takes 1,000, Disp: , Rfl:  .  CINNAMON PO, Take 325 mg by mouth every morning. Takes 2 every am, Disp: , Rfl:  .  COLCRYS 0.6 MG tablet, TAKE ONE TABLET BY MOUTH DAILY, Disp: 30 tablet, Rfl: 0 .  cyanocobalamin 2000 MCG tablet, Take 2,500 mcg by mouth 2 (two) times daily., Disp: , Rfl:  .  doxycycline (VIBRA-TABS) 100 MG tablet, Take 1 tablet (100 mg total) by mouth 2 (two) times daily., Disp: 20 tablet, Rfl: 0 .  escitalopram (LEXAPRO) 10 MG tablet, Take 1 tablet (10 mg total) by mouth daily., Disp: 90 tablet, Rfl: 1 .  fluticasone (FLONASE) 50 MCG/ACT nasal spray, Place 2 sprays into both nostrils daily as needed. , Disp: , Rfl:  .  furosemide (LASIX) 40 MG tablet, TAKE ONE TO TWO TABLETS BY MOUTH DAILY AS NEEDED FOR SWELLING, Disp: 180 tablet, Rfl: 0 .  glucosamine-chondroitin 500-400 MG tablet, Take 2 tablets by mouth 2 (two) times daily. , Disp: , Rfl:  .  guaiFENesin (MUCINEX) 600 MG 12 hr tablet, Take 400 mg by mouth 2 (two) times daily. Tales 3 tab;es twice a day, Disp: , Rfl:  .  ipratropium-albuterol (DUONEB) 0.5-2.5 (3) MG/3ML SOLN, INHALE 1 VIAL VIA NEBULIZATION 3 TIMES DAILY, Disp: 180 mL, Rfl: 3 .  loperamide (IMODIUM) 2 MG capsule, Take 1 capsule (2 mg total) by mouth 4 (four) times daily as needed for diarrhea or loose  stools., Disp: 12 capsule, Rfl: 0 .  Magnesium 400 MG CAPS, Take 3 capsules by mouth 2 (two) times daily. , Disp: , Rfl:  .  Misc Natural Products (TART CHERRY ADVANCED PO), Take by mouth., Disp: , Rfl:  .  montelukast (SINGULAIR) 10 MG tablet, Take 1 tablet (10 mg total) by mouth at bedtime., Disp: 30 tablet, Rfl: 6 .  nystatin (MYCOSTATIN/NYSTOP) powder, Apply topically 4 (four) times daily., Disp: 15 g, Rfl: 0 .  ondansetron (ZOFRAN ODT) 4 MG disintegrating tablet, Take 1 tablet (4 mg total) by mouth every 8 (eight) hours as needed for nausea or vomiting., Disp: 20 tablet, Rfl: 0 .  OXYGEN, Inhale 4 L into the lungs., Disp: , Rfl:  .  Spacer/Aero-Holding Chambers (AEROCHAMBER MV) inhaler, Use as instructed, Disp: 1 each, Rfl: 0 .  SPIRIVA HANDIHALER 18 MCG inhalation capsule, PLACE 1 CAPSULE INTO INHALER AND INHALE INTO LUNGS DAILY (EMERGENCY FILL ), Disp: 90 capsule,  Rfl: 0 .  Tiotropium Bromide Monohydrate (SPIRIVA RESPIMAT) 2.5 MCG/ACT AERS, Inhale 2 puffs into the lungs daily., Disp: 1 g, Rfl: 11 .  traMADol (ULTRAM) 50 MG tablet, Take 1 tablet (50 mg total) by mouth 3 (three) times daily as needed for moderate pain., Disp: 90 tablet, Rfl: 3 .  TURMERIC PO, Take 500 mg by mouth 2 (two) times daily. Takes 1,000 mg, Disp: , Rfl:  .  vitamin A 10000 UNIT capsule, Take 5,000 Units by mouth daily. Per pt list - takes 25,000, Disp: , Rfl:  .  azithromycin (ZITHROMAX) 250 MG tablet, Take as directed, Disp: 6 tablet, Rfl: 0  EXAM:   VITALS per patient if applicable: [id number] on RA while resting  GENERAL: alert, oriented, appears well and in no acute distress  HEENT: atraumatic, conjunttiva clear, no obvious abnormalities on inspection of external nose and ears  NECK: normal movements of the head and neck  LUNGS: on inspection no signs of respiratory distress, breathing rate appears normal, no obvious gross increased work of breathing, gasping or wheezing  CV: no obvious cyanosis  MS: moves all  visible extremities without noticeable abnormality  PSYCH/NEURO: pleasant and cooperative, no obvious depression or anxiety, speech and thought processing grossly intact  ASSESSMENT AND PLAN:   COPD mixed type ([employer]) -With recent flare up improved with z pak. -Will send another zpak Rx for her to fill during emergencies. -She will continue follow up with her pulmonologist as scheduled.    I discussed the assessment and treatment plan with the patient. The patient was provided an opportunity to ask questions and all were answered. The patient agreed with the plan and demonstrated an understanding of the instructions.   The patient was advised to call back or seek an in-person evaluation if the symptoms worsen or if the condition fails to improve as anticipated.  Time spent: 26 minutes. Greater than 50% of this time was spent in direct contact with the patient, coordinating care and discussing relevant ongoing clinical issues, including when to fill and take antibiotics and prednisone.   Lelon Frohlich, MD  [facility] at [facility]

## 2019-02-05 DIAGNOSIS — J441 Chronic obstructive pulmonary disease with (acute) exacerbation: Secondary | ICD-10-CM | POA: Diagnosis not present

## 2019-02-05 DIAGNOSIS — J449 Chronic obstructive pulmonary disease, unspecified: Secondary | ICD-10-CM | POA: Diagnosis not present

## 2019-02-05 DIAGNOSIS — M6080 Other myositis, unspecified site: Secondary | ICD-10-CM | POA: Diagnosis not present

## 2019-02-09 ENCOUNTER — Encounter: Payer: Self-pay | Admitting: Internal Medicine

## 2019-02-09 DIAGNOSIS — J449 Chronic obstructive pulmonary disease, unspecified: Secondary | ICD-10-CM

## 2019-02-09 DIAGNOSIS — J441 Chronic obstructive pulmonary disease with (acute) exacerbation: Secondary | ICD-10-CM

## 2019-02-10 NOTE — Telephone Encounter (Signed)
Left message for patient to call back. CRM created 

## 2019-02-10 NOTE — Telephone Encounter (Signed)
Can we call her back to see what is going on? She has advanced COPD and I am concerned she may need an in-person evaluation for this complaint. She will have to go to UC/ED. She will be reluctant to go.   [place]

## 2019-02-14 ENCOUNTER — Telehealth: Payer: Self-pay

## 2019-02-14 NOTE — Telephone Encounter (Signed)
Pt. Reports she "feels like her COPD has gotten worse and I do not want to see Dr. Valeta Harms at pulmonology ever again." Requests a referral to Dr. Baxter Kail "in the same practice." Reports her O2 sats are at 90-92% at rest "drop when I move around." Does not want to go to [facility] or ED. Please advise pt.

## 2019-02-14 NOTE — Telephone Encounter (Signed)
Please see below message.

## 2019-02-14 NOTE — Telephone Encounter (Signed)
Shouldn't need a referral since she is already a patient of this practice. Believe she just needs to call and request a TOC. As usual, I do recommend ER visit if she is struggling.

## 2019-02-15 NOTE — Telephone Encounter (Signed)
Message sent via MyChart.

## 2019-03-01 ENCOUNTER — Telehealth: Payer: Self-pay | Admitting: Pulmonary Disease

## 2019-03-01 NOTE — Telephone Encounter (Signed)
Dr. Halford Chessman, please advise if you are okay with pt seeing Dr. Vaughan Browner and Dr. Vaughan Browner, please advise if you are okay taking over pt's care.

## 2019-03-06 NOTE — Telephone Encounter (Signed)
PCCM: yes ok with switch Garner Nash, DO [facility] 03/06/2019 12:31 PM

## 2019-03-06 NOTE — Telephone Encounter (Signed)
Dr. Vaughan Browner and Dr. Valeta Harms, please advise if you're ok with this switch.  Thanks!

## 2019-03-06 NOTE — Telephone Encounter (Signed)
Former pt of Dr. Lenna Gilford who then transitioned to Dr. Valeta Harms.  It does not appear that I have ever seen pt before.  Therefore, I have no issue if pt wants to switch to Dr. Vaughan Browner.  Would need to confirm with Dr. Valeta Harms also.

## 2019-03-06 NOTE — Telephone Encounter (Signed)
LMTCB- please make 30 min appt with Dr Vaughan Browner when she calls back, thanks

## 2019-03-07 NOTE — Telephone Encounter (Signed)
ATC pt, no answer. Left message for pt to call back.  

## 2019-03-08 ENCOUNTER — Encounter: Payer: Self-pay | Admitting: Pulmonary Disease

## 2019-03-08 ENCOUNTER — Other Ambulatory Visit: Payer: Self-pay

## 2019-03-08 ENCOUNTER — Other Ambulatory Visit: Payer: Self-pay | Admitting: Internal Medicine

## 2019-03-08 ENCOUNTER — Ambulatory Visit: Payer: Medicare HMO | Admitting: Pulmonary Disease

## 2019-03-08 VITALS — BP 120/70 | HR 100 | Temp 97.2°F | Ht 65.0 in

## 2019-03-08 DIAGNOSIS — J449 Chronic obstructive pulmonary disease, unspecified: Secondary | ICD-10-CM

## 2019-03-08 DIAGNOSIS — M6080 Other myositis, unspecified site: Secondary | ICD-10-CM | POA: Diagnosis not present

## 2019-03-08 DIAGNOSIS — J441 Chronic obstructive pulmonary disease with (acute) exacerbation: Secondary | ICD-10-CM | POA: Diagnosis not present

## 2019-03-08 MED ORDER — TRELEGY ELLIPTA 100-62.5-25 MCG/INH IN AEPB: 1.0000 | INHALATION_SPRAY | Freq: Every day | RESPIRATORY_TRACT | 3 refills | Status: DC

## 2019-03-08 MED ORDER — TRELEGY ELLIPTA 100-62.5-25 MCG/INH IN AEPB: 1.0000 | INHALATION_SPRAY | Freq: Every day | RESPIRATORY_TRACT | 0 refills | Status: DC

## 2019-03-08 NOTE — Addendum Note (Signed)
Addended by: Hildred Alamin I on: 03/08/2019 03:39 PM   Modules accepted: Orders

## 2019-03-08 NOTE — Progress Notes (Signed)
Mackenna Serrette    SO:[id number]    [birth year]  Primary Care Physician:Hernandez Everardo Beals, MD  Referring Physician: Isaac Bliss, Rayford Halsted, MD [place],  [place] [address]  Chief complaint: Follow-up for COPD  HPI: 69 year old with history of severe COPD, fibromyalgia, obesity, OSA, vitamin D deficiency, GERD She is a former patient of Dr. Lenna Gilford.  Maintained on Symbicort and Spiriva She has noted increasing dyspnea over the past year.  Has chronic cough with minimal mucus production.  She is following up with [facility] for endobronchial wall placement.  Evaluated by Dr. Kerin Ransom in June 2020.  Recommended that she needs to improve her BMI from 41 to 35 before being considered for the procedure  She has enrolled in pulmonary rehab earlier this year and attended 2 sessions but has stopped after Covid pandemic.  In addition she is finding it hard to keep the mask on during rehab due to dyspnea.  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 Exposures: No known exposures.  No mold, hot tub, Smoking history: 30-pack-year smoker.  Quit in 1993 Travel history: No significant travel history Relevant family history: No significant family history of lung disease   Outpatient Encounter Medications as of 03/08/2019  Medication Sig  . albuterol (VENTOLIN HFA) 108 (90 Base) MCG/ACT inhaler INHALE 2 PUFFS INTO THE LUNGS EVERY 6 HOURS AS NEEDED FOR WHEEZING OR SHORTNESS OF BREATH  . ALPRAZolam (XANAX) 0.5 MG tablet Take 1 tab three times per day as needed for nerves MAX DAILY DOSE = 3 TABLETS  . Apple Cider Vinegar 500 MG TABS Take 1,000 mg by mouth daily.  . Ascorbic Acid (VITAMIN C) 1000 MG tablet Take 1,000 mg by mouth daily.  Marland Kitchen azithromycin  (ZITHROMAX) 250 MG tablet Use as directed  . azithromycin (ZITHROMAX) 250 MG tablet Take as directed  . baclofen (LIORESAL) 10 MG tablet Take 10 mg by mouth. Taking 1 nightly  . budesonide-formoterol (SYMBICORT) 160-4.5 MCG/ACT inhaler INHALE 2 PUFFS INTO THE LUNGS 2 TIMES DAILY  . cetirizine (ZYRTEC) 10 MG tablet Take 10 mg by mouth daily.    . Cholecalciferol (VITAMIN D-3) 5000 UNITS TABS Take 1 tablet by mouth daily. Per pt medication list, takes 1,000  . CINNAMON PO Take 325 mg by mouth every morning. Takes 2 every am  . COLCRYS 0.6 MG tablet TAKE ONE TABLET BY MOUTH DAILY  . cyanocobalamin 2000 MCG tablet Take 2,500 mcg by mouth 2 (two) times daily.  Marland Kitchen doxycycline (VIBRA-TABS) 100 MG tablet Take 1 tablet (100 mg total) by mouth 2 (two) times daily.  Marland Kitchen escitalopram (LEXAPRO) 10 MG tablet Take 1 tablet (10 mg total) by mouth daily.  . fluticasone (FLONASE) 50 MCG/ACT nasal spray Place 2 sprays into both nostrils daily as needed.   . furosemide (LASIX) 40 MG tablet TAKE ONE TO TWO TABLETS BY MOUTH DAILY AS NEEDED FOR SWELLING  . glucosamine-chondroitin 500-400 MG tablet Take 2 tablets by mouth 2 (two) times daily.   Marland Kitchen guaiFENesin (MUCINEX) 600 MG 12 hr tablet Take 400 mg by mouth 2 (two) times daily. Tales 3 tab;es twice a day  . ipratropium-albuterol (DUONEB) 0.5-2.5 (3) MG/3ML SOLN INHALE 1 VIAL VIA NEBULIZATION 3 TIMES  DAILY  . loperamide (IMODIUM) 2 MG capsule Take 1 capsule (2 mg total) by mouth 4 (four) times daily as needed for diarrhea or loose stools.  . Magnesium 400 MG CAPS Take 3 capsules by mouth 2 (two) times daily.   . Misc Natural Products (TART CHERRY ADVANCED PO) Take by mouth.  . montelukast (SINGULAIR) 10 MG tablet Take 1 tablet (10 mg total) by mouth at bedtime.  Marland Kitchen nystatin (MYCOSTATIN/NYSTOP) powder Apply topically 4 (four) times daily.  . ondansetron (ZOFRAN ODT) 4 MG disintegrating tablet Take 1 tablet (4 mg total) by mouth every 8 (eight) hours as needed for nausea or  vomiting.  . OXYGEN Inhale 4 L into the lungs.  Marland Kitchen Spacer/Aero-Holding Chambers (AEROCHAMBER MV) inhaler Use as instructed  . SPIRIVA HANDIHALER 18 MCG inhalation capsule PLACE 1 CAPSULE INTO INHALER AND INHALE INTO LUNGS DAILY (EMERGENCY FILL )  . Tiotropium Bromide Monohydrate (SPIRIVA RESPIMAT) 2.5 MCG/ACT AERS Inhale 2 puffs into the lungs daily.  . traMADol (ULTRAM) 50 MG tablet Take 1 tablet (50 mg total) by mouth 3 (three) times daily as needed for moderate pain.  . TURMERIC PO Take 500 mg by mouth 2 (two) times daily. Takes 1,000 mg  . vitamin A 10000 UNIT capsule Take 5,000 Units by mouth daily. Per pt list - takes 25,000   No facility-administered encounter medications on file as of 03/08/2019.    Allergies as of 03/08/2019 - Review Complete 03/08/2019  Allergen Reaction Noted  . Keflex [cephalexin] Diarrhea 04/25/2018  . Penicillins Nausea Only and Nausea And Vomiting 06/08/2014    Past Medical History:  Diagnosis Date  . Chest pain   . Chronic venous insufficiency   . COPD (chronic obstructive pulmonary disease) ([facility])   . DJD (degenerative joint disease)   . Dysthymia   . Fibromyalgia   . GERD (gastroesophageal reflux disease)   . History of headache   . Hodgkin lymphoma of extranodal or solid organ site [facility])   . Hypothyroid   . IBS (irritable bowel syndrome)   . Metabolic syndrome X   . Morbid obesity ([facility])   . OSA (obstructive sleep apnea)   . Vitamin D deficiency     Past Surgical History:  Procedure Laterality Date  . APPENDECTOMY    . BILATERAL SALPINGOOPHORECTOMY  2007   [place]  . CHOLECYSTECTOMY  1982  . LAPAROSCOPIC GASTRIC BANDING  08/2007   Dr. Hassell Done  . LAPAROSCOPIC HYSTERECTOMY  2007   [facility]  . 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 Heart failure Father   . Cancer Mother   . Parkinson's disease Mother   . Heart attack Sister     Social History   Socioeconomic History  . Marital  status: Single    Spouse name: Not on file  . Number of children: 1  . Years of education: Not on file  . Highest education level: Not on file  Occupational History  . Not on file  Tobacco Use  . Smoking status: Former Smoker    Packs/day: 1.00    Years: 29.00    Pack years: 29.00    Types: Cigarettes    Start date: 03/02/1962    Quit date: 09/27/1991    Years since quitting: 27.4  . Smokeless tobacco: Never Used  Substance and Sexual Activity  . Alcohol use: No    Alcohol/week: 0.0 standard drinks  . Drug use: No  .  Sexual activity: Not on file  Other Topics Concern  . Not on file  Social History Narrative   Does not exercise   3 cups of coffee a day   Single- one child whom she gave up for adoption at birth   And re-established contact in 2009 w/ new relationship and 2 grand daughters.   She lives with roommate.     Social Determinants of Health   Financial Resource Strain:   . Difficulty of Paying Living Expenses: Not on file  Food Insecurity:   . Worried About Charity fundraiser in the Last Year: Not on file  . Ran Out of Food in the Last Year: Not on file  Transportation Needs:   . Lack of Transportation (Medical): Not on file  . Lack of Transportation (Non-Medical): Not on file  Physical Activity:   . Days of Exercise per Week: Not on file  . Minutes of Exercise per Session: Not on file  Stress:   . Feeling of Stress : Not on file  Social Connections:   . Frequency of Communication with Friends and Family: Not on file  . Frequency of Social Gatherings with Friends and Family: Not on file  . Attends Religious Services: Not on file  . Active Member of Clubs or Organizations: Not on file  . Attends Archivist Meetings: Not on file  . Marital Status: Not on file  Intimate Partner Violence:   . Fear of Current or Ex-Partner: Not on file  . Emotionally Abused: Not on file  . Physically Abused: Not on file  . Sexually Abused: Not on file    Review of  systems: Review of Systems  Constitutional: Negative for fever and chills.  HENT: Negative.   Eyes: Negative for blurred vision.  Respiratory: as per HPI  Cardiovascular: Negative for chest pain and palpitations.  Gastrointestinal: Negative for vomiting, diarrhea, blood per rectum. Genitourinary: Negative for dysuria, urgency, frequency and hematuria.  Musculoskeletal: Negative for myalgias, back pain and joint pain.  Skin: Negative for itching and rash.  Neurological: Negative for dizziness, tremors, focal weakness, seizures and loss of consciousness.  Endo/Heme/Allergies: Negative for environmental allergies.  Psychiatric/Behavioral: Negative for depression, suicidal ideas and hallucinations.  All other systems reviewed and are negative.  Physical Exam: Blood pressure 120/70, pulse 100, SpO2 95 %. Gen:      No acute distress HEENT:  EOMI, sclera anicteric Neck:     No masses; no thyromegaly Lungs:    Clear to auscultation bilaterally; normal respiratory effort CV:         Regular rate and rhythm; no murmurs Abd:      + bowel sounds; soft, non-tender; no palpable masses, no distension Ext:    No edema; adequate peripheral perfusion Skin:      Warm and dry; no rash Neuro: alert and oriented x 3 Psych: normal mood and affect  Data Reviewed: Imaging: Chest x-ray 04/16/18- Emphysematous changes in the upper lobes with mild atelectasis in the right base.  CT chest ([facility]) 08/18/2018-severe centrilobular emphysema with bullous disease, possible dilatation of left renal collecting system > pt has already seen a urologist at [facility] for this  PFTs: 06/05/2014 FVC 1.6 (50%), FEV1 0.9 [35%],/55 Severe airway obstruction  Assessment:  Severe COPD on supplemental oxygen Currently being evaluated at [facility] for endobronchial valve placement but needs to lose weight first.  Currently on Spiriva, Symbicort.  She feels it is not helping and wants to try something new We will give  a sample of  Trelegy inhaler and if she likes it we can call in a prescription Continue supplemental oxygen She may be a good candidate for Daliresp.  Have given her literature to review and discuss on return visit if she wants to go ahead with therapy. Check CMP, CBC differential, alpha-1 antitrypsin levels and phenotype  Vitamin D deficiency She is requesting some labs today including vitamin D levels We will order these.    Plan/Recommendations: - Trelegy sample - Supplemental oxygen - Consider Daliresp - Check labs  This appointment required 45 minutes of patient care (this includes precharting, chart review, review of results, face-to-face care, etc.).  Marshell Garfinkel MD [facility] 03/08/2019, 2:26 PM  CC: Isaac Bliss, [place]*

## 2019-03-08 NOTE — Telephone Encounter (Signed)
LMTCB x3 for pt. We have attempted to contact pt several times with no success or call back from pt. Per triage protocol, message will be closed.   

## 2019-03-08 NOTE — Patient Instructions (Signed)
We will give you a sample of Trelegy.  Try this and see if it works better It takes the place of Symbicort and Spiriva You may be a good candidate for Daliresp.  If you researched the side effects and feel that this is the right medication for you then we can order We will check some labs today including comprehensive metabolic panel, magnesium, phosphorus, CBC differential, IgE, alpha-1 antitrypsin levels and phenotype, vitamin D levels  Continue to work on weight loss Follow-up in 3 months

## 2019-03-13 ENCOUNTER — Telehealth: Payer: Self-pay | Admitting: Pulmonary Disease

## 2019-03-13 DIAGNOSIS — J449 Chronic obstructive pulmonary disease, unspecified: Secondary | ICD-10-CM

## 2019-03-13 MED ORDER — TRELEGY ELLIPTA 100-62.5-25 MCG/INH IN AEPB: 1.0000 | INHALATION_SPRAY | Freq: Every day | RESPIRATORY_TRACT | 5 refills | Status: DC

## 2019-03-13 NOTE — Telephone Encounter (Signed)
Spoke with the pt  She asks for trelegy inhaler to be called in, states it's helping-rx was sent  She also looked into daliresp and is willing to start on this as well  What dose would you like her on? Please advise thank you!

## 2019-03-14 NOTE — Telephone Encounter (Signed)
Message routed to pharmacy team for assistance.  I called the patient and she stated [name] Oppenheim told her the cost for Daliresp would be over $400 a month.  Is there anyway this patient would be able to get assistance to help offset the cost of the medication? Would she qualify for patient assistance program?

## 2019-03-14 NOTE — Telephone Encounter (Signed)
Daliresp dose- 250 mcg once daily for 4 weeks, followed by 500 mcg once daily.

## 2019-03-14 NOTE — Telephone Encounter (Signed)
Patient likely has a pharmacy deductible applying to her claim. Patient could try to apply for [place] patient assistance. They have certain income guidelines and for Medicare patients they require them to spend 3% of their annual income on prescriptions in the current year.   Patient will need to submit income documents and a signed pharmacy printout with application. And since the patient likely has not spent 3% of her income for 2021,  she could try submitting a financial hardship letter, detailing her monthly expenses and income. Here is the link to the application:  [email or web address]  Let us know if we can be of anymore assistance.  Thanks! Beatriz Chancellor, CPhT

## 2019-03-15 NOTE — Telephone Encounter (Signed)
Calling back [phone]

## 2019-03-15 NOTE — Telephone Encounter (Signed)
ATC patient, unable to reach left message to call office back x1

## 2019-03-15 NOTE — Telephone Encounter (Signed)
Spoke with the pt and notified of response per pharmacy team  She verbalized understanding  Would like to have the forms mailed to her  I have verified her address and mailed them

## 2019-03-23 ENCOUNTER — Telehealth: Payer: Self-pay | Admitting: Pulmonary Disease

## 2019-03-23 NOTE — Telephone Encounter (Signed)
Called and spoke to pt. Pt states her spO2 has been dropping into the 70s at times, pt states this is on 4lpm. Pt states on RA at rest she is 90-91% at the time of our conversation. Pt is able to speak long sentneces without distress or dyspnea noted over the phone. Per pt's chart she was advised to be on 4lpm with activity at the visit in 04/2018. However, in 2019 it was noted she needed 6lpm to maintain sats above 90%. Advised pt to titrate her O2 to keep her above 90% and if she has to go above 6lpm to call back. Pt states most of the time she maintains on 5lpm but because she was told 4lpm that's what she has been wearing. Pt denies any increase or worsening s/s from her baseline. Pt requested an in person visit to see Dr. Vaughan Browner to eval O2 need and to discuss her lung function with him. Offered appt with an APP for tomorrow, pt refused. Pt only wanted appt with Dr. Vaughan Browner. Appt made with Dr. Vaughan Browner on 03/29/19. Pt aware to seek emergency care if any new or worsening s/s.   Will forward to Dr. Vaughan Browner as an Juluis Rainier.

## 2019-03-29 ENCOUNTER — Other Ambulatory Visit: Payer: Self-pay

## 2019-03-29 ENCOUNTER — Ambulatory Visit (INDEPENDENT_AMBULATORY_CARE_PROVIDER_SITE_OTHER): Payer: Medicare HMO

## 2019-03-29 ENCOUNTER — Encounter: Payer: Self-pay | Admitting: Pulmonary Disease

## 2019-03-29 ENCOUNTER — Ambulatory Visit: Payer: Medicare HMO | Admitting: Pulmonary Disease

## 2019-03-29 VITALS — BP 114/68 | HR 90 | Temp 97.8°F

## 2019-03-29 DIAGNOSIS — J449 Chronic obstructive pulmonary disease, unspecified: Secondary | ICD-10-CM

## 2019-03-29 DIAGNOSIS — R0602 Shortness of breath: Secondary | ICD-10-CM | POA: Diagnosis not present

## 2019-03-29 DIAGNOSIS — E559 Vitamin D deficiency, unspecified: Secondary | ICD-10-CM | POA: Diagnosis not present

## 2019-03-29 LAB — CBC WITH DIFFERENTIAL/PLATELET
Basophils Absolute: 0.1 10*3/uL (ref 0.0–0.1)
Basophils Relative: 1.2 % (ref 0.0–3.0)
Eosinophils Absolute: 0.5 10*3/uL (ref 0.0–0.7)
Eosinophils Relative: 4.6 % (ref 0.0–5.0)
HCT: 48.5 % — ABNORMAL HIGH (ref 36.0–46.0)
Hemoglobin: 15.6 g/dL — ABNORMAL HIGH (ref 12.0–15.0)
Lymphocytes Relative: 17.9 % (ref 12.0–46.0)
Lymphs Abs: 1.9 10*3/uL (ref 0.7–4.0)
MCHC: 32.1 g/dL (ref 30.0–36.0)
MCV: 90.4 fl (ref 78.0–100.0)
Monocytes Absolute: 0.5 10*3/uL (ref 0.1–1.0)
Monocytes Relative: 5.1 % (ref 3.0–12.0)
Neutro Abs: 7.5 10*3/uL (ref 1.4–7.7)
Neutrophils Relative %: 71.2 % (ref 43.0–77.0)
Platelets: 313 10*3/uL (ref 150.0–400.0)
RBC: 5.36 Mil/uL — ABNORMAL HIGH (ref 3.87–5.11)
RDW: 15.7 % — ABNORMAL HIGH (ref 11.5–15.5)
WBC: 10.5 10*3/uL (ref 4.0–10.5)

## 2019-03-29 LAB — COMPREHENSIVE METABOLIC PANEL
ALT: 17 U/L (ref 0–35)
AST: 22 U/L (ref 0–37)
Albumin: 4.2 g/dL (ref 3.5–5.2)
Alkaline Phosphatase: 99 U/L (ref 39–117)
BUN: 26 mg/dL — ABNORMAL HIGH (ref 6–23)
CO2: 29 mEq/L (ref 19–32)
Calcium: 9.3 mg/dL (ref 8.4–10.5)
Chloride: 103 mEq/L (ref 96–112)
Creatinine, Ser: 1.4 mg/dL — ABNORMAL HIGH (ref 0.40–1.20)
GFR: 37.12 mL/min — ABNORMAL LOW (ref >60.00)
Glucose, Bld: 98 mg/dL (ref 70–99)
Potassium: 4.6 mEq/L (ref 3.5–5.1)
Sodium: 141 mEq/L (ref 135–145)
Total Bilirubin: 0.6 mg/dL (ref 0.2–1.2)
Total Protein: 6.9 g/dL (ref 6.0–8.3)

## 2019-03-29 LAB — VITAMIN D 25 HYDROXY (VIT D DEFICIENCY, FRACTURES): VITD: 74.85 ng/mL (ref 30.00–100.00)

## 2019-03-29 LAB — MAGNESIUM: Magnesium: 2.2 mg/dL (ref 1.5–2.5)

## 2019-03-29 LAB — PHOSPHORUS: Phosphorus: 4 mg/dL (ref 2.3–4.6)

## 2019-03-29 MED ORDER — ROFLUMILAST 500 MCG PO TABS: ORAL_TABLET | ORAL | 11 refills | Status: DC

## 2019-03-29 MED ORDER — AZITHROMYCIN 250 MG PO TABS: 250.0000 mg | ORAL_TABLET | Freq: Every day | ORAL | 0 refills | Status: DC

## 2019-03-29 MED ORDER — PREDNISONE 20 MG PO TABS: 40.0000 mg | ORAL_TABLET | Freq: Every day | ORAL | 0 refills | Status: DC

## 2019-03-29 NOTE — Patient Instructions (Addendum)
Continue Trelegy, supplemental oxygen We will start paperwork with Daliresp We will start on azithromycin to 250 mg daily Prednisone 40 mg a day for 5 days X-ray today Check baseline labs.

## 2019-03-29 NOTE — Progress Notes (Signed)
Rebecca [name]    [id number]    20-Jun-1950  Primary Care Physician:Hernandez Everardo Beals, MD  Referring Physician: Isaac Bliss, Rayford Halsted, MD [place],  [place] [address]  Chief complaint: Follow-up for COPD  HPI: 69 year old with history of severe COPD, fibromyalgia, obesity, OSA, vitamin D deficiency, GERD She is a former patient of Dr. Lenna Gilford.  Maintained on Symbicort and Spiriva She has noted increasing dyspnea over the past year.  Has chronic cough with minimal mucus production.  She is following up with [facility] for endobronchial wall placement.  Evaluated by Dr. Kerin Ransom in June 2020.  Recommended that she needs to improve her BMI from 41 to 35 before being considered for the procedure  She has enrolled in pulmonary rehab earlier this year and attended 2 sessions but has stopped after Covid pandemic.  In addition she is finding it hard to keep the mask on during rehab due to dyspnea.  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 Exposures: No known exposures.  No mold, hot tub, Smoking history: 30-pack-year smoker.  Quit in 1993 Travel history: No significant travel history Relevant family history: No significant family history of lung disease  Interim history: She was prescribed Daliresp but it was too expensive and she is trying patient assistance to get that Started on Trelegy at last visit feels this is better for her  Complains of worsening dyspnea over the past 1 to 2 weeks with desats to 70s.  Denies any mucus production, fevers, chills.  Outpatient Encounter Medications as of 03/29/2019  Medication Sig  . albuterol (VENTOLIN HFA) 108 (90 Base) MCG/ACT inhaler INHALE 2 PUFFS INTO THE LUNGS EVERY 6 HOURS AS NEEDED  FOR WHEEZING OR SHORTNESS OF BREATH  . ALPRAZolam (XANAX) 0.5 MG tablet Take 1 tab three times per day as needed for nerves MAX DAILY DOSE = 3 TABLETS  . Apple Cider Vinegar 500 MG TABS Take 1,000 mg by mouth daily.  . Ascorbic Acid (VITAMIN C) 1000 MG tablet Take 1,000 mg by mouth daily.  . baclofen (LIORESAL) 10 MG tablet Take 10 mg by mouth. Taking 1 nightly  . cetirizine (ZYRTEC) 10 MG tablet Take 10 mg by mouth daily.    . Cholecalciferol (VITAMIN D-3) 5000 UNITS TABS Take 1 tablet by mouth daily. Per pt medication list, takes 1,000  . CINNAMON PO Take 325 mg by mouth every morning. Takes 2 every am  . COLCRYS 0.6 MG tablet TAKE ONE TABLET BY MOUTH DAILY  . cyanocobalamin 2000 MCG tablet Take 2,500 mcg by mouth 2 (two) times daily.  Marland Kitchen doxycycline (VIBRA-TABS) 100 MG tablet Take 1 tablet (100 mg total) by mouth 2 (two) times daily.  Marland Kitchen escitalopram (LEXAPRO) 10 MG tablet Take 1 tablet (10 mg total) by mouth daily.  . fluticasone (FLONASE) 50 MCG/ACT nasal spray Place 2 sprays into both nostrils daily as needed.   . Fluticasone-Umeclidin-Vilant (TRELEGY ELLIPTA) 100-62.5-25 MCG/INH AEPB Inhale 1 puff into the lungs daily.  . Fluticasone-Umeclidin-Vilant (TRELEGY ELLIPTA) 100-62.5-25 MCG/INH AEPB Inhale 1 puff into the lungs daily.  . furosemide (LASIX) 40 MG tablet TAKE ONE TO TWO TABLETS BY MOUTH DAILY AS NEEDED FOR SWELLING  . glucosamine-chondroitin 500-400 MG tablet  Take 2 tablets by mouth 2 (two) times daily.   Marland Kitchen guaiFENesin (MUCINEX) 600 MG 12 hr tablet Take 400 mg by mouth 2 (two) times daily. Tales 3 tab;es twice a day  . ipratropium-albuterol (DUONEB) 0.5-2.5 (3) MG/3ML SOLN INHALE 1 VIAL VIA NEBULIZATION 3 TIMES DAILY  . loperamide (IMODIUM) 2 MG capsule Take 1 capsule (2 mg total) by mouth 4 (four) times daily as needed for diarrhea or loose stools.  . Magnesium 400 MG CAPS Take 3 capsules by mouth 2 (two) times daily.   . Misc Natural Products (TART CHERRY ADVANCED PO) Take by  mouth.  . montelukast (SINGULAIR) 10 MG tablet Take 1 tablet (10 mg total) by mouth at bedtime.  Marland Kitchen nystatin (MYCOSTATIN/NYSTOP) powder Apply topically 4 (four) times daily.  . ondansetron (ZOFRAN ODT) 4 MG disintegrating tablet Take 1 tablet (4 mg total) by mouth every 8 (eight) hours as needed for nausea or vomiting.  . OXYGEN Inhale 4 L into the lungs.  Marland Kitchen Spacer/Aero-Holding Chambers (AEROCHAMBER MV) inhaler Use as instructed  . SPIRIVA HANDIHALER 18 MCG inhalation capsule PLACE 1 CAPSULE INTO INHALER AND INHALE INTO LUNGS DAILY (EMERGENCY FILL )  . traMADol (ULTRAM) 50 MG tablet Take 1 tablet (50 mg total) by mouth 3 (three) times daily as needed for moderate pain.  . TURMERIC PO Take 500 mg by mouth 2 (two) times daily. Takes 1,000 mg  . vitamin A 10000 UNIT capsule Take 5,000 Units by mouth daily. Per pt list - takes 25,000  . [DISCONTINUED] azithromycin (ZITHROMAX) 250 MG tablet Use as directed  . [DISCONTINUED] azithromycin (ZITHROMAX) 250 MG tablet Take as directed   No facility-administered encounter medications on file as of 03/29/2019.   Physical Exam: Blood pressure 114/68, pulse 90, temperature 97.8 F (36.6 C), temperature source Temporal, SpO2 93 %. Gen:      No acute distress HEENT:  EOMI, sclera anicteric Neck:     No masses; no thyromegaly Lungs:    Clear to auscultation bilaterally; normal respiratory effort CV:         Regular rate and rhythm; no murmurs Abd:      + bowel sounds; soft, non-tender; no palpable masses, no distension Ext:    No edema; adequate peripheral perfusion Skin:      Warm and dry; no rash Neuro: alert and oriented x 3 Psych: normal mood and affect  Data Reviewed: Imaging: Chest x-ray 04/16/18- Emphysematous changes in the upper lobes with mild atelectasis in the right base.  CT chest ([facility]) 08/18/2018-severe centrilobular emphysema with bullous disease, possible dilatation of left renal collecting system > pt has already seen a urologist at  [facility] for this  PFTs: 06/05/2014 FVC 1.6 (50%), FEV1 0.9 [35%],/55 Severe airway obstruction  Labs:  Assessment:  Severe COPD on supplemental oxygen Currently being evaluated at [facility] for endobronchial valve placement but needs to lose weight first.  Continue Trelegy inhaler, supplemental oxygen  Chest x-ray today to evaluate worsening dyspnea Hold off on antibiotics as she does not appear infected Prescribe prednisone 40 mg a day for 5 days Start chronic azithromycin to 250 mg qd She may also be a good candidate for Daliresp.  We will complete paperwork for patient assistance in case she needs to go on this.  Check CMP, CBC differential, alpha-1 antitrypsin levels and phenotype  Vitamin D deficiency She is requesting some labs today including vitamin D levels We will order these.    Plan/Recommendations: Continue Trelegy inhaler Supplemental oxygen Start azithromycin to 250  mg daily Consider daliresp. Will start paperwork to get her approved. Prednisone 40 mg a day for 5 days Check labs  This appointment required 45 minutes of patient care (this includes precharting, chart review, review of results, face-to-face care, etc.).  Marshell Garfinkel MD [facility] 03/29/2019, 1:59 PM  CC: Isaac Bliss, [place]*

## 2019-03-29 NOTE — Addendum Note (Signed)
Addended by: Suzzanne Cloud E on: 03/29/2019 02:59 PM   Modules accepted: Orders

## 2019-03-30 ENCOUNTER — Encounter: Payer: Self-pay | Admitting: Internal Medicine

## 2019-04-03 LAB — VITAMIN D 1,25 DIHYDROXY
Vitamin D 1, 25 (OH)2 Total: 69 pg/mL (ref 18–72)
Vitamin D2 1, 25 (OH)2: <8 pg/mL
Vitamin D3 1, 25 (OH)2: 69 pg/mL

## 2019-04-03 LAB — IGE: IgE (Immunoglobulin E), Serum: 24 kU/L (ref <=114)

## 2019-04-03 LAB — ALPHA-1 ANTITRYPSIN PHENOTYPE: A-1 Antitrypsin, Ser: 173 mg/dL (ref 83–199)

## 2019-04-05 ENCOUNTER — Telehealth: Payer: Self-pay | Admitting: Pulmonary Disease

## 2019-04-05 NOTE — Telephone Encounter (Signed)
Pt returning phone call about her test results

## 2019-04-06 NOTE — Telephone Encounter (Signed)
Spoke with patient.  Let her know lab results.   Prednisone 5 days finished Tuesday (04/04/19) Trelegy first time in a year she slept this good and felt so good.   Routing to Dr. Vaughan Browner as Juluis Rainier

## 2019-04-10 ENCOUNTER — Other Ambulatory Visit: Payer: Self-pay | Admitting: Internal Medicine

## 2019-04-11 ENCOUNTER — Encounter: Payer: Self-pay | Admitting: *Deleted

## 2019-04-12 ENCOUNTER — Telehealth: Payer: Medicare HMO | Admitting: Internal Medicine

## 2019-04-12 ENCOUNTER — Telehealth (INDEPENDENT_AMBULATORY_CARE_PROVIDER_SITE_OTHER): Payer: Medicare HMO | Admitting: Internal Medicine

## 2019-04-12 ENCOUNTER — Other Ambulatory Visit: Payer: Self-pay

## 2019-04-12 ENCOUNTER — Encounter: Payer: Self-pay | Admitting: *Deleted

## 2019-04-12 DIAGNOSIS — F419 Anxiety disorder, unspecified: Secondary | ICD-10-CM | POA: Diagnosis not present

## 2019-04-12 DIAGNOSIS — F411 Generalized anxiety disorder: Secondary | ICD-10-CM

## 2019-04-12 DIAGNOSIS — J9610 Chronic respiratory failure, unspecified whether with hypoxia or hypercapnia: Secondary | ICD-10-CM | POA: Diagnosis not present

## 2019-04-12 MED ORDER — FUROSEMIDE 40 MG PO TABS: ORAL_TABLET | ORAL | 0 refills | Status: DC

## 2019-04-12 MED ORDER — ALPRAZOLAM 0.5 MG PO TABS: ORAL_TABLET | ORAL | 2 refills | Status: DC

## 2019-04-12 NOTE — Progress Notes (Signed)
Virtual Visit via Video Note  I connected with Rebecca [name] on 04/12/19 at  3:00 PM EST by a video enabled telemedicine application and verified that I am speaking with the correct person using two identifiers.  Location patient: home Location provider: work office Persons participating in the virtual visit: patient, provider  I discussed the limitations of evaluation and management by telemedicine and the availability of in person appointments. The patient expressed understanding and agreed to proceed.   HPI: She is here today for a follow-up visit.  She needs refills of her Xanax and her Lasix.  She is on Xanax 0.5 mg which she takes 1 tablet at bedtime.  She has been doing relatively well in regards to her COPD.  She last saw her pulmonologist in January and was placed on daily azithromycin, trilegy as well as a prednisone taper.  Her O2 saturations while we have been talking today have been around 92 to 94% on room air.  She still has daily shortness of breath but she has "learned how to live with it".   ROS: Constitutional: Denies fever, chills, diaphoresis, appetite change and fatigue.  HEENT: Denies photophobia, eye pain, redness, hearing loss, ear pain, congestion, sore throat, rhinorrhea, sneezing, mouth sores, trouble swallowing, neck pain, neck stiffness and tinnitus.   Respiratory: Denies  chest tightness. Cardiovascular: Denies chest pain, palpitations and leg swelling.  Gastrointestinal: Denies nausea, vomiting, abdominal pain, diarrhea, constipation, blood in stool and abdominal distention.  Genitourinary: Denies dysuria, urgency, frequency, hematuria, flank pain and difficulty urinating.  Endocrine: Denies: hot or cold intolerance, sweats, changes in hair or nails, polyuria, polydipsia. Musculoskeletal: Denies myalgias, back pain, joint swelling, arthralgias and gait problem.  Skin: Denies pallor, rash and wound.  Neurological: Denies dizziness, seizures,  syncope, weakness, light-headedness, numbness and headaches.  Hematological: Denies adenopathy. Easy bruising, personal or family bleeding history  Psychiatric/Behavioral: Denies suicidal ideation, mood changes, confusion, nervousness, sleep disturbance and agitation   Past Medical History:  Diagnosis Date  . Chest pain   . Chronic venous insufficiency   . COPD (chronic obstructive pulmonary disease) ([place])   . DJD (degenerative joint disease)   . Dysthymia   . Fibromyalgia   . GERD (gastroesophageal reflux disease)   . History of headache   . Hodgkin lymphoma of extranodal or solid organ site [facility])   . Hypothyroid   . IBS (irritable bowel syndrome)   . Metabolic syndrome X   . Morbid obesity ([place])   . OSA (obstructive sleep apnea)   . Vitamin D deficiency     Past Surgical History:  Procedure Laterality Date  . APPENDECTOMY    . BILATERAL SALPINGOOPHORECTOMY  2007   [place]  . CHOLECYSTECTOMY  1982  . LAPAROSCOPIC GASTRIC BANDING  08/2007   Dr. Hassell Done  . LAPAROSCOPIC HYSTERECTOMY  2007   [place]  . 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 Heart failure Father   . Cancer Mother   . Parkinson's disease Mother   . Heart attack Sister     SOCIAL HX:   reports that she quit smoking about 27 years ago. Her smoking use included cigarettes. She started smoking about 57 years ago. She has a 29.00 pack-year smoking history. She has never used smokeless tobacco. She reports that she does not drink alcohol or use drugs.   Current Outpatient Medications:  .  albuterol (VENTOLIN HFA) 108 (90 Base) MCG/ACT  inhaler, INHALE 2 PUFFS INTO THE LUNGS EVERY 6 HOURS AS NEEDED FOR WHEEZING OR SHORTNESS OF BREATH, Disp: 54 each, Rfl: 4 .  ALPRAZolam (XANAX) 0.5 MG tablet, Take 1 tab three times per day as needed for nerves MAX DAILY DOSE = 3 TABLETS, Disp: 90 tablet, Rfl: 2 .  Apple Cider Vinegar 500 MG TABS, Take 1,000 mg by mouth daily.,  Disp: , Rfl:  .  Ascorbic Acid (VITAMIN C) 1000 MG tablet, Take 1,000 mg by mouth daily., Disp: , Rfl:  .  azithromycin (ZITHROMAX) 250 MG tablet, Take 1 tablet (250 mg total) by mouth daily., Disp: 30 tablet, Rfl: 0 .  baclofen (LIORESAL) 10 MG tablet, Take 10 mg by mouth. Taking 1 nightly, Disp: , Rfl:  .  cetirizine (ZYRTEC) 10 MG tablet, Take 10 mg by mouth daily.  , Disp: , Rfl:  .  Cholecalciferol (VITAMIN D-3) 5000 UNITS TABS, Take 1 tablet by mouth daily. Per pt medication list, takes 1,000, Disp: , Rfl:  .  CINNAMON PO, Take 325 mg by mouth every morning. Takes 2 every am, Disp: , Rfl:  .  COLCRYS 0.6 MG tablet, TAKE ONE TABLET BY MOUTH DAILY, Disp: 30 tablet, Rfl: 0 .  cyanocobalamin 2000 MCG tablet, Take 2,500 mcg by mouth 2 (two) times daily., Disp: , Rfl:  .  doxycycline (VIBRA-TABS) 100 MG tablet, Take 1 tablet (100 mg total) by mouth 2 (two) times daily., Disp: 20 tablet, Rfl: 0 .  escitalopram (LEXAPRO) 10 MG tablet, Take 1 tablet (10 mg total) by mouth daily., Disp: 90 tablet, Rfl: 1 .  fluticasone (FLONASE) 50 MCG/ACT nasal spray, Place 2 sprays into both nostrils daily as needed. , Disp: , Rfl:  .  Fluticasone-Umeclidin-Vilant (TRELEGY ELLIPTA) 100-62.5-25 MCG/INH AEPB, Inhale 1 puff into the lungs daily., Disp: 60 each, Rfl: 3 .  Fluticasone-Umeclidin-Vilant (TRELEGY ELLIPTA) 100-62.5-25 MCG/INH AEPB, Inhale 1 puff into the lungs daily., Disp: 60 each, Rfl: 5 .  furosemide (LASIX) 40 MG tablet, Take 1 tablet as needed for swelling, Disp: 180 tablet, Rfl: 0 .  glucosamine-chondroitin 500-400 MG tablet, Take 2 tablets by mouth 2 (two) times daily. , Disp: , Rfl:  .  guaiFENesin (MUCINEX) 600 MG 12 hr tablet, Take 400 mg by mouth 2 (two) times daily. Tales 3 tab;es twice a day, Disp: , Rfl:  .  ipratropium-albuterol (DUONEB) 0.5-2.5 (3) MG/3ML SOLN, INHALE 1 VIAL VIA NEBULIZATION 3 TIMES DAILY, Disp: 180 mL, Rfl: 3 .  loperamide (IMODIUM) 2 MG capsule, Take 1 capsule (2 mg total)  by mouth 4 (four) times daily as needed for diarrhea or loose stools., Disp: 12 capsule, Rfl: 0 .  Magnesium 400 MG CAPS, Take 3 capsules by mouth 2 (two) times daily. , Disp: , Rfl:  .  Misc Natural Products (TART CHERRY ADVANCED PO), Take by mouth., Disp: , Rfl:  .  montelukast (SINGULAIR) 10 MG tablet, Take 1 tablet (10 mg total) by mouth at bedtime., Disp: 30 tablet, Rfl: 6 .  nystatin (MYCOSTATIN/NYSTOP) powder, Apply topically 4 (four) times daily., Disp: 15 g, Rfl: 0 .  ondansetron (ZOFRAN ODT) 4 MG disintegrating tablet, Take 1 tablet (4 mg total) by mouth every 8 (eight) hours as needed for nausea or vomiting., Disp: 20 tablet, Rfl: 0 .  OXYGEN, Inhale 4 L into the lungs., Disp: , Rfl:  .  predniSONE (DELTASONE) 20 MG tablet, Take 2 tablets (40 mg total) by mouth daily with breakfast., Disp: 10 tablet, Rfl: 0 .  roflumilast (  DALIRESP) 500 MCG TABS tablet, Take 250 mcg daily for 2 weeks then 500 mcg daily, Disp: 30 tablet, Rfl: 11 .  Spacer/Aero-Holding Chambers (AEROCHAMBER MV) inhaler, Use as instructed, Disp: 1 each, Rfl: 0 .  SPIRIVA HANDIHALER 18 MCG inhalation capsule, PLACE 1 CAPSULE INTO INHALER AND INHALE INTO LUNGS DAILY (EMERGENCY FILL ), Disp: 90 capsule, Rfl: 0 .  traMADol (ULTRAM) 50 MG tablet, Take 1 tablet (50 mg total) by mouth 3 (three) times daily as needed for moderate pain., Disp: 90 tablet, Rfl: 3 .  TURMERIC PO, Take 500 mg by mouth 2 (two) times daily. Takes 1,000 mg, Disp: , Rfl:  .  vitamin A 10000 UNIT capsule, Take 5,000 Units by mouth daily. Per pt list - takes 25,000, Disp: , Rfl:   EXAM:   VITALS per patient if applicable: O2 sats of 92 to 94% on room air  GENERAL: alert, oriented, appears well and in no acute distress, mild shortness of breath  HEENT: atraumatic, conjunttiva clear, no obvious abnormalities on inspection of external nose and ears  NECK: normal movements of the head and neck  LUNGS: on inspection no signs of respiratory distress,  breathing rate appears normal, no obvious gross increased work of breathing, gasping or wheezing  CV: no obvious cyanosis  MS: moves all visible extremities without noticeable abnormality  PSYCH/NEURO: pleasant and cooperative, no obvious depression or anxiety, speech and thought processing grossly intact  ASSESSMENT AND PLAN:   Chronic respiratory failure, unspecified whether with hypoxia or hypercapnia ([name])  -Appears to be doing relatively well only uses oxygen with ambulation. -I have given her information on the COVID-19 vaccine and how to put herself on the waiting list for it. -She will continue to follow-up with pulmonary as scheduled.   Anxiety  - Plan: ALPRAZolam (XANAX) 0.5 MG tablet     I discussed the assessment and treatment plan with the patient. The patient was provided an opportunity to ask questions and all were answered. The patient agreed with the plan and demonstrated an understanding of the instructions.   The patient was advised to call back or seek an in-person evaluation if the symptoms worsen or if the condition fails to improve as anticipated.    Lelon Frohlich, MD  [facility]

## 2019-04-28 ENCOUNTER — Telehealth: Payer: Self-pay | Admitting: Pulmonary Disease

## 2019-04-28 DIAGNOSIS — J449 Chronic obstructive pulmonary disease, unspecified: Secondary | ICD-10-CM

## 2019-04-28 MED ORDER — ROFLUMILAST 500 MCG PO TABS: ORAL_TABLET | ORAL | 11 refills | Status: DC

## 2019-04-28 NOTE — Telephone Encounter (Addendum)
I have gotten the paperwork from Dr. Matilde Bash box. I have printed a script for Daliresp for Dr. Vaughan Browner to sign. I called and spoke with the patient and advised her that Dr. Vaughan Browner will be back in the office on Wednesday to sign it and I will mail it to [facility]. She verbalized understanding.

## 2019-04-28 NOTE — Telephone Encounter (Deleted)
Medication is Daliresp. Not noted in previous note.

## 2019-05-01 ENCOUNTER — Other Ambulatory Visit: Payer: Self-pay | Admitting: Pulmonary Disease

## 2019-05-01 ENCOUNTER — Telehealth: Payer: Self-pay | Admitting: Pulmonary Disease

## 2019-05-01 DIAGNOSIS — J449 Chronic obstructive pulmonary disease, unspecified: Secondary | ICD-10-CM

## 2019-05-01 MED ORDER — AZITHROMYCIN 250 MG PO TABS: 250.0000 mg | ORAL_TABLET | Freq: Every day | ORAL | 1 refills | Status: DC

## 2019-05-01 NOTE — Telephone Encounter (Signed)
Called and spoke to pt. Informed her of the recs per Wyn Quaker, NP. Zithromax sent in for refill.  Pt is going to start her Zithromax and call back if s/s dont improve or worsen. Will keep message open if Dr. Vaughan Browner has any recs for pt.

## 2019-05-01 NOTE — Telephone Encounter (Signed)
Primary Pulmonologist: Dr. Vaughan Browner Last office visit and with whom: 03/29/19, [place] What do we see them for (pulmonary problems): COPD Last OV assessment/plan:  Instructions  Continue Trelegy, supplemental oxygen We will start paperwork with Daliresp We will start on azithromycin to 250 mg daily Prednisone 40 mg a day for 5 days X-ray today Check baseline labs.       Was appointment offered to patient (explain)?  Yes, Patient declined.  Patient felt her lungs may need to be listened to, but did not want to come in office. Patient does not like wearing a mask. Patient declined my chart, and telephone visits.    Reason for call:   Patient stated she is having a "wet cough", due to wearing her mask for 1 hour Saturday.  Patient stated this happens whenever she wears a mask. Patient stated she is having clear sputum, denies fever, body aches, loss of taste, smell, or possible covid exposures.  Patient described sob as same, but is having some sob, with rest.  Patient feels wearing a mask is causing her to breathe in her mouth bacteria, which is causing a wet sounding cough. Patient stated she finished her recent prescription for azithromycin Saturday.  Patient stated she was on azithromycin for 1 month, by Dr. Vaughan Browner. Patient is requesting a refill, or additional antibiotic to be [facility] to [name] [place].   Message routed to [place], NP

## 2019-05-01 NOTE — Telephone Encounter (Signed)
Per last office note it looks like the patient was started on azithromycin 250 mg daily.  So she should still be maintained on this.  She should receive a refill.  She should not have ran out of this.  Please go ahead and refill this medication.  Will route to Dr. Vaughan Browner as Juluis Rainier if he has additional thoughts or recommendations he can come in.  If patient symptoms persist she will need a televisit/virtual visit at least to further assess.  If patient is seen in the office she will have to wear a mask per our office policy.  Wyn Quaker, FNP

## 2019-05-02 NOTE — Telephone Encounter (Signed)
Agree. No additional reccs

## 2019-05-02 NOTE — Telephone Encounter (Signed)
Noted  

## 2019-05-08 ENCOUNTER — Telehealth: Payer: Self-pay | Admitting: Pulmonary Disease

## 2019-05-08 NOTE — Telephone Encounter (Signed)
Left message for patient to call back  

## 2019-05-09 ENCOUNTER — Telehealth (INDEPENDENT_AMBULATORY_CARE_PROVIDER_SITE_OTHER): Payer: Medicare HMO | Admitting: Adult Health

## 2019-05-09 DIAGNOSIS — J9610 Chronic respiratory failure, unspecified whether with hypoxia or hypercapnia: Secondary | ICD-10-CM

## 2019-05-09 DIAGNOSIS — J449 Chronic obstructive pulmonary disease, unspecified: Secondary | ICD-10-CM

## 2019-05-09 MED ORDER — PREDNISONE 10 MG PO TABS: ORAL_TABLET | ORAL | 0 refills | Status: DC

## 2019-05-09 NOTE — Patient Instructions (Addendum)
Prednisone 20mg  daily for 5 days then 10mg  daily for 5 days and stop .  Continue on Trelegy 1 puff daily , rinse after use.  Continue on Azithromycin daily .  Continue on Oxygen 4l/m at rest and  5l/m with activity . Goal is to have oxygen level >88-90%.  Activity as tolerated.  COVID vaccine when able.  Follow up Dr. Vaughan Browner in 4 weeks and As needed   Please contact office for sooner follow up if symptoms do not improve or worsen or seek emergency care

## 2019-05-09 NOTE — Telephone Encounter (Signed)
Pt called back-- please return call.  

## 2019-05-09 NOTE — Progress Notes (Signed)
Virtual Visit via Telemedicine  Note  I connected with Rebecca [name] on 05/09/19 at  3:00 PM EST by a video enabled telemedicine application and verified that I am speaking with the correct person using two identifiers.  Location: Patient: Home  Provider: Home    I discussed the limitations of evaluation and management by telemedicine and the availability of in person appointments. The patient expressed understanding and agreed to proceed.  History of Present Illness: 69 year old female followed for severe COPD and oxygen dependent respiratory failure Medical history significant for obesity, fibromyalgia, GERD  Today's video visit is for an acute office.  Patient complains of the last 3 days her breathing has been worse with increased cough congestion and shortness of breath.  Mucus is clear. No feve.r  Patient remains on Trelegy and azithromycin daily. She started on left over prednisone 20mg  .daily 2 days ago, feels this has helped. No fever , chest pain, orthopnea, or edema.  She is on oxygen 4l/m. Noticed oxygen levels have been lower with activity . Turns Oxygen 5l/m . Has to rest more often.    Patient Active Problem List   Diagnosis Date Noted  . Takes dietary supplements 04/26/2018  . DNR (do not resuscitate) 04/05/2018  . CKD (chronic kidney disease) stage 3, GFR 30-59 ml/min 03/22/2018  . Umbilical hernia without obstruction and without gangrene 01/18/2018  . Degenerative arthritis of knee, bilateral 05/13/2017  . Osteoarthritis of spine with radiculopathy, cervical region 07/31/2016  . Nocturnal leg cramps 01/09/2015  . Diastolic dysfunction [id number]  . Pain, joint, ankle, left 07/10/2014  . Olecranon bursitis of right elbow 05/17/2014  . Left arm pain 03/10/2014  . Biceps tendinitis on left 03/10/2014  . Neck pain of over 3 months duration 03/10/2014  . Chronic respiratory failure ([place]) 02/15/2014  . Chest pressure 05/25/2013  . Impaired glucose tolerance  12/08/2011  . Type 2 diabetes mellitus ([place]) 06/26/2011  . Chest pain, atypical 01/14/2011  . Asthma with exacerbation 04/11/2009  . VITAMIN D DEFICIENCY 01/21/2009  . Hypothyroidism 06/24/2007  . METABOLIC SYNDROME X [id number]  . Morbid obesity ([place]) 06/24/2007  . DYSTHYMIA 06/24/2007  . Venous (peripheral) insufficiency 06/24/2007  . Osteoarthritis 06/24/2007  . UTERINE CANCER, HX OF 06/24/2007  . Obstructive sleep apnea 02/28/2007  . COPD mixed type ([place]) 02/28/2007  . GERD 02/28/2007  . Irritable bowel syndrome 02/28/2007   Current Outpatient Medications on File Prior to Visit  Medication Sig Dispense Refill  . albuterol (VENTOLIN HFA) 108 (90 Base) MCG/ACT inhaler INHALE 2 PUFFS INTO THE LUNGS EVERY 6 HOURS AS NEEDED FOR WHEEZING OR SHORTNESS OF BREATH 54 each 4  . ALPRAZolam (XANAX) 0.5 MG tablet Take 1 tab three times per day as needed for nerves MAX DAILY DOSE = 3 TABLETS 90 tablet 2  . Apple Cider Vinegar 500 MG TABS Take 1,000 mg by mouth daily.    . Ascorbic Acid (VITAMIN C) 1000 MG tablet Take 1,000 mg by mouth daily.    Marland Kitchen azithromycin (ZITHROMAX) 250 MG tablet Take 1 tablet (250 mg total) by mouth daily. 30 tablet 1  . baclofen (LIORESAL) 10 MG tablet Take 10 mg by mouth. Taking 1 nightly    . cetirizine (ZYRTEC) 10 MG tablet Take 10 mg by mouth daily.      . Cholecalciferol (VITAMIN D-3) 5000 UNITS TABS Take 1 tablet by mouth daily. Per pt medication list, takes 1,000    . CINNAMON PO Take 325 mg by mouth every morning. Takes 2  every am    . COLCRYS 0.6 MG tablet TAKE ONE TABLET BY MOUTH DAILY 30 tablet 0  . cyanocobalamin 2000 MCG tablet Take 2,500 mcg by mouth 2 (two) times daily.    Marland Kitchen doxycycline (VIBRA-TABS) 100 MG tablet Take 1 tablet (100 mg total) by mouth 2 (two) times daily. 20 tablet 0  . escitalopram (LEXAPRO) 10 MG tablet Take 1 tablet (10 mg total) by mouth daily. 90 tablet 1  . fluticasone (FLONASE) 50 MCG/ACT nasal spray Place 2 sprays into both nostrils  daily as needed.     . Fluticasone-Umeclidin-Vilant (TRELEGY ELLIPTA) 100-62.5-25 MCG/INH AEPB Inhale 1 puff into the lungs daily. 60 each 3  . Fluticasone-Umeclidin-Vilant (TRELEGY ELLIPTA) 100-62.5-25 MCG/INH AEPB Inhale 1 puff into the lungs daily. 60 each 5  . furosemide (LASIX) 40 MG tablet Take 1 tablet as needed for swelling 180 tablet 0  . glucosamine-chondroitin 500-400 MG tablet Take 2 tablets by mouth 2 (two) times daily.     Marland Kitchen guaiFENesin (MUCINEX) 600 MG 12 hr tablet Take 400 mg by mouth 2 (two) times daily. Tales 3 tab;es twice a day    . ipratropium-albuterol (DUONEB) 0.5-2.5 (3) MG/3ML SOLN INHALE 1 VIAL VIA NEBULIZATION 3 TIMES DAILY 180 mL 3  . loperamide (IMODIUM) 2 MG capsule Take 1 capsule (2 mg total) by mouth 4 (four) times daily as needed for diarrhea or loose stools. 12 capsule 0  . Magnesium 400 MG CAPS Take 3 capsules by mouth 2 (two) times daily.     . Misc Natural Products (TART CHERRY ADVANCED PO) Take by mouth.    . montelukast (SINGULAIR) 10 MG tablet Take 1 tablet (10 mg total) by mouth at bedtime. 30 tablet 6  . nystatin (MYCOSTATIN/NYSTOP) powder Apply topically 4 (four) times daily. 15 g 0  . ondansetron (ZOFRAN ODT) 4 MG disintegrating tablet Take 1 tablet (4 mg total) by mouth every 8 (eight) hours as needed for nausea or vomiting. 20 tablet 0  . OXYGEN Inhale 4 L into the lungs.    . predniSONE (DELTASONE) 20 MG tablet Take 2 tablets (40 mg total) by mouth daily with breakfast. 10 tablet 0  . roflumilast (DALIRESP) 500 MCG TABS tablet Take 250 mcg daily for 2 weeks then 500 mcg daily 30 tablet 11  . Spacer/Aero-Holding Chambers (AEROCHAMBER MV) inhaler Use as instructed 1 each 0  . SPIRIVA HANDIHALER 18 MCG inhalation capsule PLACE 1 CAPSULE INTO INHALER AND INHALE INTO LUNGS DAILY (EMERGENCY FILL ) 90 capsule 0  . traMADol (ULTRAM) 50 MG tablet Take 1 tablet (50 mg total) by mouth 3 (three) times daily as needed for moderate pain. 90 tablet 3  . TURMERIC PO  Take 500 mg by mouth 2 (two) times daily. Takes 1,000 mg    . vitamin A 10000 UNIT capsule Take 5,000 Units by mouth daily. Per pt list - takes 25,000     No current facility-administered medications on file prior to visit.        Observations/Objective: Chest x-ray 04/16/18- Emphysematous changes in the upper lobes with mild atelectasis in the right base.  CT chest ([facility]) 08/18/2018-severe centrilobular emphysema with bullous disease, possible dilatation of left renal collecting system > pt has already seen a [facility]rologist at [facility] for this  PFTs: 06/05/2014 FVC 1.6 (50%), FEV1 0.9 [35%],/55 Severe airway obstruction   Assessment and Plan: COPD exacerbation - prone to frequent flares  Chronic Respiratory Failure - - keep o2 sats >88-90%   Plan  Patient Instructions  Prednisone 20mg  daily for 5 days then 10mg  daily for 5 days and stop .  Continue on Trelegy 1 puff daily , rinse after use.  Continue on Azithromycin daily .  Continue on Oxygen 4l/m at rest and  5l/m with activity . Goal is to have oxygen level >88-90%.  Activity as tolerated.  COVID vaccine when able.  Follow up Dr. Vaughan Browner in 4 weeks and As needed   Please contact office for sooner follow up if symptoms do not improve or worsen or seek emergency care        Follow Up Instructions: Follow up with Dr. Vaughan Browner in 4 weeks and As needed   Please contact office for sooner follow up if symptoms do not improve or worsen or seek emergency care     I discussed the assessment and treatment plan with the patient. The patient was provided an opportunity to ask questions and all were answered. The patient agreed with the plan and demonstrated an understanding of the instructions.   The patient was advised to call back or seek an in-person evaluation if the symptoms worsen or if the condition fails to improve as anticipated.  I provided  26 minutes of non-face-to-face time during this encounter.   Rexene Edison,  NP

## 2019-05-09 NOTE — Telephone Encounter (Signed)
Called and spoke with pt who stated her breathing became worse 3 days ago. Pt stated she was even having a hard time trying to make it to the bathroom due to her breathing. Pt stated for the last 3 days, she has been taking prednisone as she had an Rx on hand.  Pt stated she has also had complaints of cough with clear phlegm.  Pt finished last dose of abx 3/5.  Pt also stated that her O2 sats have been in ranging from the 70s-82% on her 5L. Pt stated this was even when she was sitting in her chair.  Stated to pt that we should get her scheduled for a visit and she verbalized understanding. Pt has been scheduled for a video visit today at 3pm with TP. Nothing further needed.

## 2019-05-09 NOTE — Telephone Encounter (Signed)
lmtcb for pt.  

## 2019-05-11 ENCOUNTER — Telehealth: Payer: Self-pay | Admitting: Pulmonary Disease

## 2019-05-11 NOTE — Telephone Encounter (Signed)
Called and spoke with Patient.  Patient stated she dropped off Daliresp paper work for Dr. Vaughan Browner to sign.  Patient stated sign prescription, paperwork, and insurance needs to be faxed to [phone].  Will route message to [place] to follow up

## 2019-05-12 NOTE — Telephone Encounter (Signed)
Paperwork was sent off last week. I have called patient and left her a voicemail making her aware.

## 2019-05-23 ENCOUNTER — Other Ambulatory Visit: Payer: Self-pay | Admitting: Internal Medicine

## 2019-05-25 ENCOUNTER — Other Ambulatory Visit: Payer: Self-pay | Admitting: Internal Medicine

## 2019-05-25 DIAGNOSIS — F339 Major depressive disorder, recurrent, unspecified: Secondary | ICD-10-CM

## 2019-05-25 DIAGNOSIS — F419 Anxiety disorder, unspecified: Secondary | ICD-10-CM

## 2019-05-30 ENCOUNTER — Telehealth: Payer: Self-pay | Admitting: Internal Medicine

## 2019-05-30 NOTE — Telephone Encounter (Signed)
Spoke with pharmacist and refill should be done by her pulmonologist

## 2019-05-30 NOTE — Telephone Encounter (Signed)
Patient is needing an ok with the pharmacy to get the nebulizer liquid called in.  Pharmacy states they have been trying to reach the office for the ok for the refill. Pharmacist takes lunch between 2-2:30.  [name] in [place]

## 2019-05-31 ENCOUNTER — Telehealth: Payer: Self-pay | Admitting: Pulmonary Disease

## 2019-05-31 DIAGNOSIS — R0602 Shortness of breath: Secondary | ICD-10-CM

## 2019-05-31 NOTE — Telephone Encounter (Signed)
See other message from 05/31/19.

## 2019-05-31 NOTE — Telephone Encounter (Signed)
Called and spoke with Patient.  Patient is seen by Dr. Vaughan Browner, for COPD. Patient stated she is having increased sob, with O2 @ 8L at times.  Patient stated she finished prednisone prescribed by Tammy,NP, and has started Daliresp.  Patient stated Prednisone helped, but understands why she can not stay on prednisone.  Patient stated Daliresp is hard to break in half for her 250mg  instructions for 14days.  Patient stated she is needing a refill of her Duo neb, but thinks she may need a cardiology referral for her sob, because patient thinks it could be cardiac related. Patient is using Trelegy daily, and using her albuterol inhaler as needed. Patient denies any cough, or fever.    LOV- 05/09/19- TP    Prednisone 20mg  daily for 5 days then 10mg  daily for 5 days and stop .  Continue on Trelegy 1 puff daily , rinse after use.  Continue on Azithromycin daily .  Continue on Oxygen 4l/m at rest and  5l/m with activity . Goal is to have oxygen level >88-90%.  Activity as tolerated.  COVID vaccine when able.  Follow up Dr. Vaughan Browner in 4 weeks and As needed   Please contact office for sooner follow up if symptoms do not improve or worsen or seek emergency care    Message routed to [facility]- app of day to advise on refill, referral, and recommendations.

## 2019-05-31 NOTE — Telephone Encounter (Signed)
Refill Duonebs She needs to be seen with a CXR and BNP as soon as possible Refer to Cardiology as urgent referral Does she have swelling in her feet/ ankles?  Continue Trelegy Continue oxygen Saturation goals are > 88% at all times She needs to go to the ED for emergency care if she gets worse

## 2019-06-01 ENCOUNTER — Telehealth: Payer: Self-pay | Admitting: Pulmonary Disease

## 2019-06-01 MED ORDER — IPRATROPIUM-ALBUTEROL 0.5-2.5 (3) MG/3ML IN SOLN: RESPIRATORY_TRACT | 3 refills | Status: DC

## 2019-06-01 NOTE — Telephone Encounter (Signed)
Called and spoke with Patient.  Judson Roch, NP recommendations given.  Understanding stated. CXR and BNP order placed. Patient denies any swelling in her ankles and feet. Patient stated she would come Monday, 06/05/19. Patient scheduled tele visit 06/20/19.

## 2019-06-01 NOTE — Addendum Note (Signed)
Addended by: Elton Sin on: 06/01/2019 04:07 PM   Modules accepted: Orders

## 2019-06-01 NOTE — Telephone Encounter (Signed)
Urgent referral to cardiology has been placed per SG and also have refilled pt's duoneb sol.  Attempted to call pt but unable to reach. Left message for her to return call.

## 2019-06-01 NOTE — Addendum Note (Signed)
Addended by: Lorretta Harp on: 06/01/2019 08:58 AM   Modules accepted: Orders

## 2019-06-05 NOTE — Telephone Encounter (Signed)
Called and spoke to pt. Pt c/o increase in SOB and cough. Pt states she feels great when she is on the prednisone but once it is complete then she begins feeling worse again. Pt states her SOB has significantly worsened. Advised pt per Magnus Sinning recommendations if her s/s were to worsen then to seek emergency care. Pt refused to go to ED or UC. Appt made with Wyn Quaker, NP, on 4/6 (no available openings today 4/5). Pt verbalized understanding and pt is aware to seek emergency care if any s/s worsen. Nothing further needed at this time.

## 2019-06-06 ENCOUNTER — Telehealth: Payer: Medicare HMO | Admitting: Pulmonary Disease

## 2019-06-20 ENCOUNTER — Telehealth: Payer: Self-pay

## 2019-06-20 ENCOUNTER — Encounter: Payer: Self-pay | Admitting: Pulmonary Disease

## 2019-06-20 ENCOUNTER — Encounter: Payer: Medicare HMO | Admitting: Pulmonary Disease

## 2019-06-20 DIAGNOSIS — J449 Chronic obstructive pulmonary disease, unspecified: Secondary | ICD-10-CM

## 2019-06-20 NOTE — Telephone Encounter (Signed)
Patient was scheduled for a video visit today and we could not see each other on the video. I sent her a link via email and she was able to connect but the video would not work. I offered her a televisit and she refused stating she wanted to be able to see him. I made her an in person visit at his next available which is 07/04/19.   Dr. Vaughan Browner, During the conversation she states that she has had terrible nausea and diarrhea(thats getting better) with the [place]. She states that her breathing is worse at night then in the mornings. She states that she is waiting for her shipment on the medication to come today. She is currently on 500mg  daily. Please advise.

## 2019-06-22 MED ORDER — ONDANSETRON HCL 4 MG PO TABS: 4.0000 mg | ORAL_TABLET | Freq: Three times a day (TID) | ORAL | 0 refills | Status: DC | PRN

## 2019-06-22 NOTE — Telephone Encounter (Signed)
I called in zofran tabs to take as needed for nausea. We will discuss additional reccs at time of return visit in early may

## 2019-06-22 NOTE — Telephone Encounter (Signed)
I called and spoke with patient and made her aware to continue the 500mg  daily and she verbalized understanding. She states that the diarrhea has gotten better and she will pick up her medication for the nausea this evening.

## 2019-06-22 NOTE — Telephone Encounter (Signed)
Patient is returning phone call. Patient phone number is [phone]. °

## 2019-06-22 NOTE — Telephone Encounter (Addendum)
Called pt and advised message from the provider. Pt understood.  She wants to know if she should continue the Daliresp because she doesn't have any left at this time. She is getting the medication from the manufacturer because the medication is so expensive but is waiting for it in the mail. When she gets the medication does she start at 571mcg?  Pt is very upset because she feels confused and doesn't feel like she is getting the care she needs. The telephone system here made her miss her virtual visit that had to be canceled because she couldn't get through the phone line. I apologized repeatedly.   I called [facility] and they stated the shipment went out 06/20/2019 and it shows it has been delivered.. She should call on 09/09/2019 for refills but she is advised to call every time she needs a refill.  Tracking number [id number]  I will call pt back after I get a response from Dr. Frederico Hamman [phone]    [phone]

## 2019-06-22 NOTE — Telephone Encounter (Signed)
ATC pt, no answer. Left message for pt to call back.  

## 2019-06-22 NOTE — Telephone Encounter (Signed)
Yes. Continue 529mcg

## 2019-06-22 NOTE — Telephone Encounter (Signed)
Dr. Vaughan Browner, Should she continue on the 500mg  daily? She received the shipment today.

## 2019-06-22 NOTE — Telephone Encounter (Signed)
Pt called back-- just received Daliresp  Pt said she would be going back to taking it as normal. Call if needed.

## 2019-06-24 ENCOUNTER — Other Ambulatory Visit: Payer: Self-pay | Admitting: Internal Medicine

## 2019-06-24 DIAGNOSIS — J9610 Chronic respiratory failure, unspecified whether with hypoxia or hypercapnia: Secondary | ICD-10-CM

## 2019-06-27 ENCOUNTER — Ambulatory Visit: Payer: Medicare HMO | Admitting: Cardiology

## 2019-06-27 ENCOUNTER — Telehealth: Payer: Self-pay | Admitting: Pulmonary Disease

## 2019-06-27 NOTE — Telephone Encounter (Signed)
Attempted to call pt but unable to reach. Left message for her to return call. Will try to call pt back later if she has not returned call before phones cut off at 5pm.

## 2019-06-27 NOTE — Telephone Encounter (Signed)
Received a call from Emmett with [employer]. While he was speaking with pt, pt's mind set had changed and she stated that she was now not wanting to harm herself. He stated that he tried to get her to go to the hospital via EMS but she refused to go and since she was now saying that she was not wanting to harm herself, they could not forcefully take her with them.  Officer Sabra Heck stated that he read side effects of the daliresp medication that pt is currently on and he stated that one of the main side effects of that medication is suicidal thoughts. He wanted to know if we should stop that medication and change her to another medication.  Also he wanted to know if we wanted to get her to come in for an office visit to further manage her medications. Dr. Vaughan Browner, please advise.

## 2019-06-27 NOTE — Telephone Encounter (Signed)
Yes. Stop the daliresp. She already has a follow up appointment next week with me

## 2019-06-27 NOTE — Telephone Encounter (Signed)
Tried to call pt again but line went straight to VM. Encounter will be kept open so we can try to reach pt tomorrow.

## 2019-06-27 NOTE — Telephone Encounter (Signed)
Spoke with pt who states she has been suffering from depression and has been having suicidal thoughts and behavior. Pt states her symptoms have gradually come on over the past 2 weeks. Today has been the worst day with having the suicidal thoughts.  Pt states she has been on daliresp for about 3 weeks now and this is a new medication she has recently been on. Pt states she is not sure if this is the reason for her feeling the way she does. Pt was taking 238mcg daliresp for 14 days but then three weeks she has been on 513mcg.  Pt states she is on an antidepressent medication which was prescribed by her PCP. Pt is currently on sertraline 50mg  and states that she only takes 1/2 tab due to having issues with insomnia.  Pt states last night 4/26 she took a trazodone to see if it would help her sleep overnight and she said after that, she slept good for about 5 hours. Pt also takes a xanax at bedtime.  Pt states she has been having more problems with her breathing.   Pt states that with ambulation she does use her O2 between 4-6L but when she is sitting at rest, she does not wear it. Pt states that she has been waking up in the night with a headache and she wonders if she is using too much O2 overnight.  Asked pt if she was still having the suicidal thoughts and she said that she is. Pt stated that she wants all this to be over. Pt does have a psychiatrist that she follows but it has been a few weeks since she has been there to see him.  Pt stated that PCP was going to prescribe another antidepressant medication but that was turned down by pt as it had side effects of insomnia and since she already had problems with insomnia.  While on the phone with pt, 911 was called to have police dispatched to pt's address as she was having suicidal thoughts. Police did arrive to pt's address while I was on the phone speaking with pt and they were let in by pt's roommate to speak with pt.  Routing this to Dr.Mannam as  an Pharmacist, hospital.

## 2019-06-28 NOTE — Telephone Encounter (Signed)
ATC patient X3 LMTCB per protocol will close this encounter.

## 2019-06-30 ENCOUNTER — Telehealth: Payer: Self-pay | Admitting: Pulmonary Disease

## 2019-06-30 NOTE — Telephone Encounter (Signed)
ATC pt, call went straight to VM. LMTCB x1 for pt. We need to know which medications she needs refilled.

## 2019-07-03 NOTE — Telephone Encounter (Signed)
LMTCB x2  

## 2019-07-04 ENCOUNTER — Encounter: Payer: Self-pay | Admitting: Pulmonary Disease

## 2019-07-04 ENCOUNTER — Other Ambulatory Visit: Payer: Self-pay

## 2019-07-04 ENCOUNTER — Ambulatory Visit: Payer: Medicare HMO | Admitting: Pulmonary Disease

## 2019-07-04 DIAGNOSIS — J449 Chronic obstructive pulmonary disease, unspecified: Secondary | ICD-10-CM

## 2019-07-04 MED ORDER — SODIUM CHLORIDE 3 % IN NEBU: INHALATION_SOLUTION | Freq: Two times a day (BID) | RESPIRATORY_TRACT | 12 refills | Status: AC

## 2019-07-04 MED ORDER — AZITHROMYCIN 250 MG PO TABS: 250.0000 mg | ORAL_TABLET | Freq: Every day | ORAL | 1 refills | Status: DC

## 2019-07-04 MED ORDER — ALBUTEROL SULFATE HFA 108 (90 BASE) MCG/ACT IN AERS: INHALATION_SPRAY | RESPIRATORY_TRACT | 11 refills | Status: DC

## 2019-07-04 NOTE — Addendum Note (Signed)
Addended by: Hildred Alamin I on: 07/04/2019 03:15 PM   Modules accepted: Orders

## 2019-07-04 NOTE — Progress Notes (Signed)
Mystical Storz    [id number]    1950/07/03  Primary Care Physician:Hernandez Everardo Beals, MD  Referring Physician: Isaac Bliss, Rayford Halsted, MD [place],  [place] [address]  Chief complaint: Follow-up for COPD  HPI: 69 year old with history of severe COPD, fibromyalgia, obesity, OSA, vitamin D deficiency, GERD She is a former patient of Dr. Lenna Gilford.  Maintained on Symbicort and Spiriva She has noted increasing dyspnea over the past year.  Has chronic cough with minimal mucus production.  She is following up with [facility] for endobronchial wall placement.  Evaluated by Dr. Kerin Ransom in June 2020.  Recommended that she needs to improve her BMI from 41 to 35 before being considered for the procedure  She has enrolled in pulmonary rehab earlier this year and attended 2 sessions but has stopped after Covid pandemic.  In addition she is finding it hard to keep the mask on during rehab due to dyspnea.  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 Exposures: No known exposures.  No mold, hot tub, Smoking history: 30-pack-year smoker.  Quit in 1993 Travel history: No significant travel history Relevant family history: No significant family history of lung disease  Interim history: Prescribed Daliresp in early 2021.  This was poorly tolerated with nausea, diarrhea, mood changes including suicidal thoughts.  We had stopped it a few weeks ago with improvement in symptoms Continues on Trelegy inhaler.  Complains of ongoing chest congestion, chronic dyspnea on exertion.  Outpatient Encounter Medications as of 07/04/2019  Medication Sig  . albuterol (VENTOLIN HFA) 108 (90 Base) MCG/ACT inhaler INHALE 2 PUFFS INTO THE LUNGS EVERY 6 HOURS AS NEEDED  FOR WHEEZING OR SHORTNESS OF BREATH  . ALPRAZolam (XANAX) 0.5 MG tablet Take 1 tab three times per day as needed for nerves MAX DAILY DOSE = 3 TABLETS  . Apple Cider Vinegar 500 MG TABS Take 1,000 mg by mouth daily.  . Ascorbic Acid (VITAMIN C) 1000 MG tablet Take 1,000 mg by mouth daily.  Marland Kitchen azithromycin (ZITHROMAX) 250 MG tablet Take 1 tablet (250 mg total) by mouth daily.  . baclofen (LIORESAL) 10 MG tablet Take 10 mg by mouth. Taking 1 nightly  . cetirizine (ZYRTEC) 10 MG tablet Take 10 mg by mouth daily.    . Cholecalciferol (VITAMIN D-3) 5000 UNITS TABS Take 1 tablet by mouth daily. Per pt medication list, takes 1,000  . CINNAMON PO Take 325 mg by mouth every morning. Takes 2 every am  . COLCRYS 0.6 MG tablet TAKE ONE TABLET BY MOUTH DAILY  . cyanocobalamin 2000 MCG tablet Take 2,500 mcg by mouth 2 (two) times daily.  Marland Kitchen escitalopram (LEXAPRO) 10 MG tablet Take 1 tablet (10 mg total) by mouth daily.  . fluticasone (FLONASE) 50 MCG/ACT nasal spray Place 2 sprays into both nostrils daily as needed.   . Fluticasone-Umeclidin-Vilant (TRELEGY ELLIPTA) 100-62.5-25 MCG/INH AEPB Inhale 1 puff into the lungs daily.  . Fluticasone-Umeclidin-Vilant (TRELEGY ELLIPTA) 100-62.5-25 MCG/INH AEPB Inhale 1 puff into the lungs daily.  . furosemide (LASIX) 40 MG tablet TAKE ONE TO TWO TABLETS BY MOUTH DAILY AS NEEDED FOR SWELLING  . glucosamine-chondroitin 500-400 MG tablet Take 2 tablets by mouth 2 (two) times daily.   Marland Kitchen  guaiFENesin (MUCINEX) 600 MG 12 hr tablet Take 400 mg by mouth 2 (two) times daily. Tales 3 tab;es twice a day  . ipratropium-albuterol (DUONEB) 0.5-2.5 (3) MG/3ML SOLN INHALE 1 VIAL VIA NEBULIZATION 3 TIMES DAILY  . loperamide (IMODIUM) 2 MG capsule Take 1 capsule (2 mg total) by mouth 4 (four) times daily as needed for diarrhea or loose stools.  . Magnesium 400 MG CAPS Take 3 capsules by mouth 2 (two) times daily.   . Misc Natural Products (TART CHERRY ADVANCED PO) Take by mouth.  .  ondansetron (ZOFRAN ODT) 4 MG disintegrating tablet Take 1 tablet (4 mg total) by mouth every 8 (eight) hours as needed for nausea or vomiting.  . OXYGEN Inhale 4 L into the lungs.  . sertraline (ZOLOFT) 50 MG tablet TAKE ONE TABLET BY MOUTH DAILY  . Spacer/Aero-Holding Chambers (AEROCHAMBER MV) inhaler Use as instructed  . SPIRIVA HANDIHALER 18 MCG inhalation capsule PLACE 1 CAPSULE INTO INHALER AND INHALE INTO LUNGS DAILY (EMERGENCY FILL )  . traMADol (ULTRAM) 50 MG tablet Take 1 tablet (50 mg total) by mouth 3 (three) times daily as needed for moderate pain.  . TURMERIC PO Take 500 mg by mouth 2 (two) times daily. Takes 1,000 mg  . vitamin A 10000 UNIT capsule Take 5,000 Units by mouth daily. Per pt list - takes 25,000  . [DISCONTINUED] albuterol (VENTOLIN HFA) 108 (90 Base) MCG/ACT inhaler INHALE 2 PUFFS INTO THE LUNGS EVERY 6 HOURS AS NEEDED FOR WHEEZING OR SHORTNESS OF BREATH  . [DISCONTINUED] azithromycin (ZITHROMAX) 250 MG tablet Take 1 tablet (250 mg total) by mouth daily.  . [DISCONTINUED] montelukast (SINGULAIR) 10 MG tablet Take 1 tablet (10 mg total) by mouth at bedtime.  . [DISCONTINUED] nystatin (MYCOSTATIN/NYSTOP) powder Apply topically 4 (four) times daily.  . [DISCONTINUED] ondansetron (ZOFRAN) 4 MG tablet Take 1 tablet (4 mg total) by mouth every 8 (eight) hours as needed for nausea or vomiting.  . [DISCONTINUED] roflumilast (DALIRESP) 500 MCG TABS tablet Take 250 mcg daily for 2 weeks then 500 mcg daily   No facility-administered encounter medications on file as of 07/04/2019.   Physical Exam: Blood pressure 122/80, pulse 90, temperature 98.5 F (36.9 C), temperature source Temporal, weight 246 lb 6.4 oz (111.8 kg), SpO2 93 %. Gen:      No acute distress HEENT:  EOMI, sclera anicteric Neck:     No masses; no thyromegaly Lungs:    Clear to auscultation bilaterally; normal respiratory effort CV:         Regular rate and rhythm; no murmurs Abd:      + bowel sounds; soft,  non-tender; no palpable masses, no distension Ext:    No edema; adequate peripheral perfusion Skin:      Warm and dry; no rash Neuro: alert and oriented x 3 Psych: normal mood and affect  Data Reviewed: Imaging: Chest x-ray 04/16/18- Emphysematous changes in the upper lobes with mild atelectasis in the right base.  CT chest ([facility]) 08/18/2018-severe centrilobular emphysema with bullous disease, possible dilatation of left renal collecting system > pt has already seen a urologist at [facility] for this  PFTs: 06/05/2014 FVC 1.6 (50%), FEV1 0.9 [35%],/55 Severe airway obstruction  Labs:  Assessment:  Severe COPD on supplemental oxygen Currently being evaluated at [facility] for endobronchial valve placement but needs to lose weight first.  Continue Trelegy inhaler, supplemental oxygen  Stop Daliresp due to side effects She continues on chronic azithromycin daily. Continue Trelegy inhaler, rescue duo nebs as needed  Start flutter valve, hypertonic saline for mucociliary clearance  Plan/Recommendations: Continue Trelegy inhaler Supplemental oxygen Azithromycin to 250 mg daily Flutter valve, hypertonic saline, Mucinex  Marshell Garfinkel MD [facility] 07/04/2019, 2:29 PM  CC: Isaac Bliss, [place]*

## 2019-07-04 NOTE — Telephone Encounter (Signed)
Pt had appt today with Dr Vaughan Browner so will close encounter

## 2019-07-04 NOTE — Patient Instructions (Signed)
We will start you on hypertonic saline 4 mL 2 times daily for mucociliary clearance Use this in conjunction with flutter valve and Mucinex Continue Trelegy inhaler  Follow-up in 6 months.

## 2019-07-05 ENCOUNTER — Other Ambulatory Visit: Payer: Self-pay

## 2019-07-05 MED ORDER — IPRATROPIUM-ALBUTEROL 0.5-2.5 (3) MG/3ML IN SOLN: RESPIRATORY_TRACT | 11 refills | Status: DC

## 2019-07-17 ENCOUNTER — Encounter: Payer: Self-pay | Admitting: Acute Care

## 2019-07-17 ENCOUNTER — Telehealth: Payer: Self-pay | Admitting: Pulmonary Disease

## 2019-07-17 NOTE — Telephone Encounter (Signed)
ATC Patient.  LM to call back. 

## 2019-07-18 ENCOUNTER — Telehealth: Payer: Self-pay | Admitting: Internal Medicine

## 2019-07-18 NOTE — Telephone Encounter (Signed)
Rebecca [name] with [facility], is requesting clarification on Albuterol Solution. Thanks  [name] [phone]

## 2019-07-18 NOTE — Telephone Encounter (Signed)
LMTCB x2 for pt 

## 2019-07-18 NOTE — Telephone Encounter (Signed)
Refill was sent by Marshell Garfinkel, MD

## 2019-07-19 ENCOUNTER — Telehealth: Payer: Self-pay | Admitting: Internal Medicine

## 2019-07-19 NOTE — Telephone Encounter (Signed)
LMTCB x3 for pt. We have attempted to contact pt several times with no success or call back from pt. Per triage protocol, message will be closed.   

## 2019-07-19 NOTE — Chronic Care Management (AMB) (Signed)
  Chronic Care Management   Note  07/19/2019 Name: Rebecca [name] MRN: [id number] DOB: 10/22/1950  Rebecca [name] is a 69 y.o. year old female who is a primary care patient of Isaac Bliss, Rayford Halsted, MD. I reached out to Carlyon Shadow by phone today in response to a referral sent by Rebecca [name]'s PCP, Isaac Bliss, Rayford Halsted, MD.   Rebecca [name] was given information about Chronic Care Management services today including:  1. CCM service includes personalized support from designated clinical staff supervised by her physician, including individualized plan of care and coordination with other care providers 2. 24/7 contact phone numbers for assistance for urgent and routine care needs. 3. Service will only be billed when office clinical staff spend 20 minutes or more in a month to coordinate care. 4. Only one practitioner may furnish and bill the service in a calendar month. 5. The patient may stop CCM services at any time (effective at the end of the month) by phone call to the office staff.   Patient agreed to services and verbal consent obtained.   Follow up plan:   [place]

## 2019-08-01 ENCOUNTER — Telehealth: Payer: Self-pay | Admitting: Internal Medicine

## 2019-08-01 DIAGNOSIS — F419 Anxiety disorder, unspecified: Secondary | ICD-10-CM

## 2019-08-01 DIAGNOSIS — F339 Major depressive disorder, recurrent, unspecified: Secondary | ICD-10-CM

## 2019-08-01 MED ORDER — SERTRALINE HCL 50 MG PO TABS: 50.0000 mg | ORAL_TABLET | Freq: Every day | ORAL | 1 refills | Status: DC

## 2019-08-01 NOTE — Telephone Encounter (Signed)
Pt is calling in stating that she needs a refill on sertraline (ZOLOFT) 50 MG     Pharm:  [facility] [place], [place]

## 2019-08-01 NOTE — Telephone Encounter (Signed)
Refill sent.

## 2019-08-04 ENCOUNTER — Other Ambulatory Visit: Payer: Self-pay | Admitting: Internal Medicine

## 2019-08-04 ENCOUNTER — Telehealth: Payer: Self-pay | Admitting: Internal Medicine

## 2019-08-04 DIAGNOSIS — M1A9XX0 Chronic gout, unspecified, without tophus (tophi): Secondary | ICD-10-CM

## 2019-08-04 MED ORDER — COLCRYS 0.6 MG PO TABS: 0.6000 mg | ORAL_TABLET | Freq: Two times a day (BID) | ORAL | 2 refills | Status: DC

## 2019-08-04 NOTE — Telephone Encounter (Signed)
Refilled colchicine 

## 2019-08-04 NOTE — Telephone Encounter (Signed)
Pt call and want something call in for gout call it she stated she is in a lot of pain.

## 2019-08-07 ENCOUNTER — Ambulatory Visit: Payer: Medicare HMO | Attending: Internal Medicine

## 2019-08-07 DIAGNOSIS — Z23 Encounter for immunization: Secondary | ICD-10-CM

## 2019-08-07 NOTE — Progress Notes (Signed)
° °  Covid-19 Vaccination Clinic  Name:  Rebecca [name]    MRN: [id number] DOB: [birth year]  08/07/2019  Ms. Gillin was observed post Covid-19 immunization for 15 minutes without incident. She was provided with Vaccine Information Sheet and instruction to access the V-Safe system.   Ms. Stare was instructed to call 911 with any severe reactions post vaccine:  Difficulty breathing   Swelling of face and throat   A fast heartbeat   A bad rash all over body   Dizziness and weakness   Immunizations Administered    Name Date Dose VIS Date Route   Pfizer COVID-19 Vaccine 08/07/2019  2:02 PM 0.3 mL 04/26/2018 Intramuscular   Manufacturer: [place]   Lot: [id number]   Sentinel: [address]-2

## 2019-08-10 ENCOUNTER — Ambulatory Visit: Payer: Self-pay

## 2019-08-14 ENCOUNTER — Telehealth: Payer: Self-pay | Admitting: Internal Medicine

## 2019-08-14 NOTE — Telephone Encounter (Signed)
Pt stated she thought she had gout after taking the first inj of COVID then it subsided. She received her second inj of the COVID vaccine and has another flare up. She stated she has been doubling up on her medication for it but still painful and wonders if this has any connection to the vaccine?   Pt can be reached at [phone]  Pt asked to be transferred to Nurse Triage until she hears back from PCP. She is aware PCP does not work Mondays

## 2019-08-15 ENCOUNTER — Ambulatory Visit (INDEPENDENT_AMBULATORY_CARE_PROVIDER_SITE_OTHER): Payer: Medicare HMO | Admitting: Family Medicine

## 2019-08-15 ENCOUNTER — Other Ambulatory Visit: Payer: Self-pay

## 2019-08-15 ENCOUNTER — Encounter: Payer: Self-pay | Admitting: Family Medicine

## 2019-08-15 ENCOUNTER — Telehealth (HOSPITAL_COMMUNITY): Payer: Self-pay | Admitting: Internal Medicine

## 2019-08-15 VITALS — BP 120/64 | HR 90 | Temp 98.2°F

## 2019-08-15 DIAGNOSIS — M79604 Pain in right leg: Secondary | ICD-10-CM

## 2019-08-15 NOTE — Telephone Encounter (Signed)
No connection that I am aware of with gout flares and the COVID vaccine.Marland KitchenMarland Kitchen

## 2019-08-15 NOTE — Progress Notes (Signed)
   Subjective:    Patient ID: Rebecca [name], female    DOB: 1950/06/18, 69 y.o.   MRN: [id number]  HPI Here for 3 weeks of swelling and pain in the right calf and foot. No recent trauma. She assumed this was gout and she has been taking Colcrys BID, however this has not helped. Usually she gets immediate results with Colcrys, and the swelling in her calf is unusual. She has significant COPD but she denies any more SOB than usual, and she has had no chest pain.    Review of Systems  Constitutional: Negative.   Respiratory: Positive for shortness of breath. Negative for cough.   Cardiovascular: Positive for leg swelling. Negative for chest pain and palpitations.  Musculoskeletal: Positive for arthralgias and joint swelling.       Objective:   Physical Exam Constitutional:      General: She is not in acute distress.    Appearance: She is obese.  Cardiovascular:     Rate and Rhythm: Normal rate and regular rhythm.     Pulses: Normal pulses.     Heart sounds: Normal heart sounds.  Pulmonary:     Effort: Pulmonary effort is normal.     Breath sounds: Normal breath sounds.  Musculoskeletal:     Comments: The right calf and right foot are swollen, but there is no erythema or warmth or tenderness. No cords are felt, but Rebecca [name] is positive on the right.   Neurological:     Mental Status: She is alert.           Assessment & Plan:  Swelling and pain in the right calf and foot which is suspicious for a DVT. This is definitely not gout, so she can stop the Colcrys. We will get a venous doppler asap to evaluate. Rebecca Penna, MD

## 2019-08-15 NOTE — Telephone Encounter (Signed)
Patient was advised by nurse triage to go to [facility] but she refused. Patient has an appointment with  today at 3:15PM

## 2019-08-15 NOTE — Telephone Encounter (Signed)
Patient was seen in the office today by Dr Sarajane Jews

## 2019-08-16 ENCOUNTER — Ambulatory Visit (HOSPITAL_COMMUNITY)
Admission: RE | Admit: 2019-08-16 | Discharge: 2019-08-16 | Disposition: A | Discharge status: Home / Self Care | Payer: Medicare HMO | Source: Ambulatory Visit | Attending: Cardiovascular Disease | Admitting: Cardiovascular Disease

## 2019-08-16 DIAGNOSIS — M79604 Pain in right leg: Secondary | ICD-10-CM | POA: Diagnosis not present

## 2019-08-17 ENCOUNTER — Telehealth: Payer: Self-pay | Admitting: Pulmonary Disease

## 2019-08-17 NOTE — Telephone Encounter (Signed)
Attempted to call pt but there was no answer. I have left a message for pt to return our call x1.  

## 2019-08-18 NOTE — Telephone Encounter (Signed)
ATC pt, call went straight to VM. LMTCB x2 for pt.  

## 2019-08-21 NOTE — Telephone Encounter (Signed)
ATC patient LMTCB x3 per protocol I will close this encounter

## 2019-08-23 ENCOUNTER — Telehealth: Payer: Self-pay | Admitting: Pulmonary Disease

## 2019-08-23 DIAGNOSIS — R0602 Shortness of breath: Secondary | ICD-10-CM

## 2019-08-23 MED ORDER — PREDNISONE 10 MG PO TABS: 40.0000 mg | ORAL_TABLET | Freq: Every day | ORAL | 0 refills | Status: AC

## 2019-08-23 NOTE — Telephone Encounter (Signed)
Pt returning a phone call. Pt can be reached at [phone].

## 2019-08-23 NOTE — Telephone Encounter (Signed)
Offer prednisone burst 40mg /day for 5 days. Get chest x ray The earliest I can see her in clinic is last patient after 12am on 6/28 AM. Tele visit is ok Ok to open up a slot for her.

## 2019-08-23 NOTE — Telephone Encounter (Signed)
2nd Attempted to reach patient.  Goes straight to VM.  Left VM requesting a return call.

## 2019-08-23 NOTE — Telephone Encounter (Signed)
Pt returning call.  [phone].

## 2019-08-23 NOTE — Telephone Encounter (Signed)
Spoke with patient, she is having sob mostly with exertion, but does have it at times when she is sitting.  Her O2 sat on RA was 82% and 87%.  She is on 5L of oxygen and is still sob.  She is using Albuterol for sob.  At first the sodium chloride nebulizer solution helped, but now it makes her feel like she is drowning and takes her breath away.  She received her 2nd Pfiser vaccine 2 weeks ago and wonders if that is causing any of her sob.  She denies any cough or fever.  She went to see Dr. Sarajane Jews and she was only 88% on 5L.  Dr. Vaughan Browner please advise.

## 2019-08-23 NOTE — Telephone Encounter (Signed)
Patient returned call and verbalized understanding of medication prescribed and where to get her CXR, she will try to get it tomorrow.  She states she is feeling some better since taking her 80mg  of Lasix.  Pt. Stated she could do a video visit on 08/28/19.  Nothing further needed.

## 2019-08-23 NOTE — Telephone Encounter (Signed)
I have been unable to reach patient directly, her phone goes directly to VM no matter what phone I call from in the office.  I tried 2 phones in the triage room and 2 phones at the front desk.  I left her 2 detailed messages with the information provided by Dr. Vaughan Browner.  I ordered the CXR for [facility] and left that in formation on her VM.  Her prednisone was sent to [name] in [place].  Advised to call the office with any questions.  Requested that she call the office back regarding the televisit with Dr. Vaughan Browner on 6/28 after 12 pm.

## 2019-08-24 ENCOUNTER — Ambulatory Visit (INDEPENDENT_AMBULATORY_CARE_PROVIDER_SITE_OTHER): Payer: Medicare HMO

## 2019-08-24 ENCOUNTER — Other Ambulatory Visit: Payer: Self-pay

## 2019-08-24 DIAGNOSIS — R0602 Shortness of breath: Secondary | ICD-10-CM

## 2019-08-24 DIAGNOSIS — J90 Pleural effusion, not elsewhere classified: Secondary | ICD-10-CM | POA: Diagnosis not present

## 2019-08-24 DIAGNOSIS — J439 Emphysema, unspecified: Secondary | ICD-10-CM | POA: Diagnosis not present

## 2019-08-24 DIAGNOSIS — R0600 Dyspnea, unspecified: Secondary | ICD-10-CM | POA: Diagnosis not present

## 2019-08-28 ENCOUNTER — Other Ambulatory Visit: Payer: Self-pay

## 2019-08-28 ENCOUNTER — Ambulatory Visit: Payer: Medicare HMO | Admitting: Pulmonary Disease

## 2019-08-28 ENCOUNTER — Ambulatory Visit (INDEPENDENT_AMBULATORY_CARE_PROVIDER_SITE_OTHER): Payer: Medicare HMO | Admitting: Pulmonary Disease

## 2019-08-28 DIAGNOSIS — M6281 Muscle weakness (generalized): Secondary | ICD-10-CM

## 2019-08-28 DIAGNOSIS — J449 Chronic obstructive pulmonary disease, unspecified: Secondary | ICD-10-CM

## 2019-08-28 MED ORDER — AZITHROMYCIN 250 MG PO TABS
250.0000 mg | ORAL_TABLET | Freq: Every day | ORAL | 3 refills | Status: DC
Start: 2019-08-28 — End: 2020-01-01

## 2019-08-28 NOTE — Progress Notes (Signed)
Rebecca [name]    [id number]    [birth year]  Primary Care Physician:Hernandez Everardo Beals, MD  Referring Physician: Isaac Bliss, Rayford Halsted, MD [place],  [place] [address]  Virtual Visit via Telephone Note  I connected with Rebecca [name] on 08/31/19 at 12:15 PM EDT by telephone and verified that I am speaking with the correct person using two identifiers.  Location: Patient: Home Provider: [facility], [place], [place]   I discussed the limitations, risks, security and privacy concerns of performing an evaluation and management service by telephone and the availability of in person appointments. I also discussed with the patient that there may be a patient responsible charge related to this service. The patient expressed understanding and agreed to proceed.   Chief complaint: Follow-up for COPD  HPI: 69 year old with history of severe COPD, fibromyalgia, obesity, OSA, vitamin D deficiency, GERD She is a former patient of Dr. Lenna Gilford.  Maintained on Symbicort and Spiriva She has noted increasing dyspnea over the past year.  Has chronic cough with minimal mucus production.  She is following up with [facility] for endobronchial wall placement.  Evaluated by Dr. Kerin Ransom in June 2020.  Recommended that she needs to improve her BMI from 41 to 35 before being considered for the procedure  She has enrolled in pulmonary rehab earlier this year and attended 2 sessions but has stopped after Covid pandemic.  In addition she is finding it hard to keep the mask on during rehab due to dyspnea.  Prescribed Daliresp in early 2021.  This was poorly tolerated with nausea, diarrhea, mood changes including suicidal thoughts.  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 Exposures: No known exposures.  No mold, hot tub, Smoking history: 30-pack-year smoker.  Quit in 1993 Travel history: No significant travel history Relevant family history: No significant family history of lung disease  Interim history: She had a mild exacerbation earlier this month.  Given prednisone Chest x-ray with no acute abnormality.  Outpatient Encounter Medications as of 08/28/2019  Medication Sig  . albuterol (VENTOLIN HFA) 108 (90 Base) MCG/ACT inhaler INHALE 2 PUFFS INTO THE LUNGS EVERY 6 HOURS AS NEEDED FOR WHEEZING OR SHORTNESS OF BREATH  . ALPRAZolam (XANAX) 0.5 MG tablet Take 1 tab three times per day as needed for nerves MAX DAILY DOSE = 3 TABLETS  . Apple Cider Vinegar 500 MG TABS Take 1,000 mg by mouth daily.  . Ascorbic Acid (VITAMIN C) 1000 MG tablet Take 1,000 mg by mouth daily.  . baclofen (LIORESAL) 10 MG tablet Take 10 mg by mouth. Taking 1 nightly  . cetirizine (ZYRTEC) 10 MG tablet Take 10 mg by mouth daily.    . Cholecalciferol (VITAMIN D-3) 5000 UNITS TABS Take 1 tablet by mouth daily. Per pt medication list, takes 1,000  . CINNAMON PO Take 325 mg by mouth every morning. Takes 2 every am  . COLCRYS 0.6 MG tablet Take 1 tablet (0.6 mg total) by mouth 2 (two) times daily.  . cyanocobalamin 2000 MCG tablet Take 2,500 mcg by mouth 2 (two) times daily.  Marland Kitchen escitalopram (LEXAPRO) 10 MG tablet Take  1 tablet (10 mg total) by mouth daily.  . fluticasone (FLONASE) 50 MCG/ACT nasal spray Place 2 sprays into both nostrils daily as needed.   . Fluticasone-Umeclidin-Vilant (TRELEGY ELLIPTA) 100-62.5-25 MCG/INH AEPB Inhale 1 puff into the lungs daily.  . furosemide (LASIX) 40 MG tablet TAKE ONE TO TWO TABLETS BY MOUTH DAILY AS NEEDED FOR SWELLING  . glucosamine-chondroitin 500-400 MG tablet Take 2 tablets by mouth 2 (two) times daily.   Marland Kitchen guaiFENesin (MUCINEX) 600 MG 12 hr  tablet Take 400 mg by mouth 2 (two) times daily. Tales 3 tab;es twice a day  . ipratropium-albuterol (DUONEB) 0.5-2.5 (3) MG/3ML SOLN INHALE ONE VIAL VIA NEBULIZATION BY MOUTH THREE TIMES A DAY  . ipratropium-albuterol (DUONEB) 0.5-2.5 (3) MG/3ML SOLN INHALE 1 VIAL VIA NEBULIZATION 3 TIMES DAILY  . loperamide (IMODIUM) 2 MG capsule Take 1 capsule (2 mg total) by mouth 4 (four) times daily as needed for diarrhea or loose stools.  . Magnesium 400 MG CAPS Take 3 capsules by mouth 2 (two) times daily.   . Misc Natural Products (TART CHERRY ADVANCED PO) Take by mouth.  . ondansetron (ZOFRAN ODT) 4 MG disintegrating tablet Take 1 tablet (4 mg total) by mouth every 8 (eight) hours as needed for nausea or vomiting.  . OXYGEN Inhale 4 L into the lungs.  . predniSONE (DELTASONE) 10 MG tablet Take 4 tablets (40 mg total) by mouth daily with breakfast for 5 days.  Marland Kitchen sertraline (ZOLOFT) 50 MG tablet Take 1 tablet (50 mg total) by mouth daily.  Marland Kitchen Spacer/Aero-Holding Chambers (AEROCHAMBER MV) inhaler Use as instructed  . traMADol (ULTRAM) 50 MG tablet Take 1 tablet (50 mg total) by mouth 3 (three) times daily as needed for moderate pain.  . TURMERIC PO Take 500 mg by mouth 2 (two) times daily. Takes 1,000 mg  . vitamin A 10000 UNIT capsule Take 5,000 Units by mouth daily. Per pt list - takes 25,000  . [DISCONTINUED] SPIRIVA HANDIHALER 18 MCG inhalation capsule PLACE 1 CAPSULE INTO INHALER AND INHALE INTO LUNGS DAILY (EMERGENCY FILL )  . sodium chloride HYPERTONIC 3 % nebulizer solution Take by nebulization 2 (two) times daily. (Patient not taking: Reported on 08/28/2019)  . [DISCONTINUED] azithromycin (ZITHROMAX) 250 MG tablet Take 1 tablet (250 mg total) by mouth daily.  . [DISCONTINUED] Fluticasone-Umeclidin-Vilant (TRELEGY ELLIPTA) 100-62.5-25 MCG/INH AEPB Inhale 1 puff into the lungs daily.   No facility-administered encounter medications on file as of 08/28/2019.   Physical Exam: Televisit  Data  Reviewed: Imaging: Chest x-ray 04/16/18- Emphysematous changes in the upper lobes with mild atelectasis in the right base.  CT chest ([facility]) 08/18/2018-severe centrilobular emphysema with bullous disease, possible dilatation of left renal collecting system > pt has already seen a [facility]rologist at [facility] for this  Chest x-ray 08/24/2019-emphysematous changes.  No acute abnormality.  PFTs: 06/05/2014 FVC 1.6 (50%), FEV1 0.9 [35%],/55 Severe airway obstruction  Assessment:  Severe COPD on supplemental oxygen Currently being evaluated at [facility] for endobronchial valve placement but needs to lose weight first.  Continue Trelegy inhaler, supplemental oxygen  Off Daliresp due to side effects She continues on chronic azithromycin daily. Continue Trelegy inhaler, rescue duo nebs as needed  Continue flutter valve, hypertonic saline for mucociliary clearance  Plan/Recommendations: Continue Trelegy inhaler Supplemental oxygen Azithromycin 250 mg daily Flutter valve, hypertonic saline, Mucinex  I provided 25 minutes of non-face-to-face time during this encounter.  Marshell Garfinkel MD [facility] 08/28/2019, 12:42 PM  CC: Isaac Bliss, [place]*

## 2019-08-31 ENCOUNTER — Telehealth: Payer: Medicare HMO

## 2019-09-07 DIAGNOSIS — M797 Fibromyalgia: Secondary | ICD-10-CM | POA: Diagnosis not present

## 2019-09-07 DIAGNOSIS — J449 Chronic obstructive pulmonary disease, unspecified: Secondary | ICD-10-CM | POA: Diagnosis not present

## 2019-09-07 DIAGNOSIS — K219 Gastro-esophageal reflux disease without esophagitis: Secondary | ICD-10-CM | POA: Diagnosis not present

## 2019-09-07 DIAGNOSIS — Z6841 Body Mass Index (BMI) 40.0 and over, adult: Secondary | ICD-10-CM | POA: Diagnosis not present

## 2019-09-07 DIAGNOSIS — E559 Vitamin D deficiency, unspecified: Secondary | ICD-10-CM | POA: Diagnosis not present

## 2019-09-07 DIAGNOSIS — E669 Obesity, unspecified: Secondary | ICD-10-CM | POA: Diagnosis not present

## 2019-09-07 DIAGNOSIS — M6281 Muscle weakness (generalized): Secondary | ICD-10-CM | POA: Diagnosis not present

## 2019-09-07 DIAGNOSIS — Z9981 Dependence on supplemental oxygen: Secondary | ICD-10-CM | POA: Diagnosis not present

## 2019-09-12 DIAGNOSIS — Z6841 Body Mass Index (BMI) 40.0 and over, adult: Secondary | ICD-10-CM | POA: Diagnosis not present

## 2019-09-12 DIAGNOSIS — K219 Gastro-esophageal reflux disease without esophagitis: Secondary | ICD-10-CM | POA: Diagnosis not present

## 2019-09-12 DIAGNOSIS — M797 Fibromyalgia: Secondary | ICD-10-CM | POA: Diagnosis not present

## 2019-09-12 DIAGNOSIS — Z9981 Dependence on supplemental oxygen: Secondary | ICD-10-CM | POA: Diagnosis not present

## 2019-09-12 DIAGNOSIS — E559 Vitamin D deficiency, unspecified: Secondary | ICD-10-CM | POA: Diagnosis not present

## 2019-09-12 DIAGNOSIS — J449 Chronic obstructive pulmonary disease, unspecified: Secondary | ICD-10-CM | POA: Diagnosis not present

## 2019-09-12 DIAGNOSIS — E669 Obesity, unspecified: Secondary | ICD-10-CM | POA: Diagnosis not present

## 2019-09-12 DIAGNOSIS — M6281 Muscle weakness (generalized): Secondary | ICD-10-CM | POA: Diagnosis not present

## 2019-09-14 DIAGNOSIS — M6281 Muscle weakness (generalized): Secondary | ICD-10-CM | POA: Diagnosis not present

## 2019-09-14 DIAGNOSIS — K219 Gastro-esophageal reflux disease without esophagitis: Secondary | ICD-10-CM | POA: Diagnosis not present

## 2019-09-14 DIAGNOSIS — E669 Obesity, unspecified: Secondary | ICD-10-CM | POA: Diagnosis not present

## 2019-09-14 DIAGNOSIS — J449 Chronic obstructive pulmonary disease, unspecified: Secondary | ICD-10-CM | POA: Diagnosis not present

## 2019-09-14 DIAGNOSIS — Z9981 Dependence on supplemental oxygen: Secondary | ICD-10-CM | POA: Diagnosis not present

## 2019-09-14 DIAGNOSIS — Z6841 Body Mass Index (BMI) 40.0 and over, adult: Secondary | ICD-10-CM | POA: Diagnosis not present

## 2019-09-14 DIAGNOSIS — E559 Vitamin D deficiency, unspecified: Secondary | ICD-10-CM | POA: Diagnosis not present

## 2019-09-14 DIAGNOSIS — M797 Fibromyalgia: Secondary | ICD-10-CM | POA: Diagnosis not present

## 2019-09-18 IMAGING — DX DG LUMBAR SPINE 2-3V
3 series · 3 of 3 positions shown · non-contrast
Comparison: Plain films lumbar spine 01/09/2015.

CLINICAL DATA: Chronic bilateral leg and knee pain.

EXAM:
LUMBAR SPINE - 2-3 VIEW

[l-spine ap]
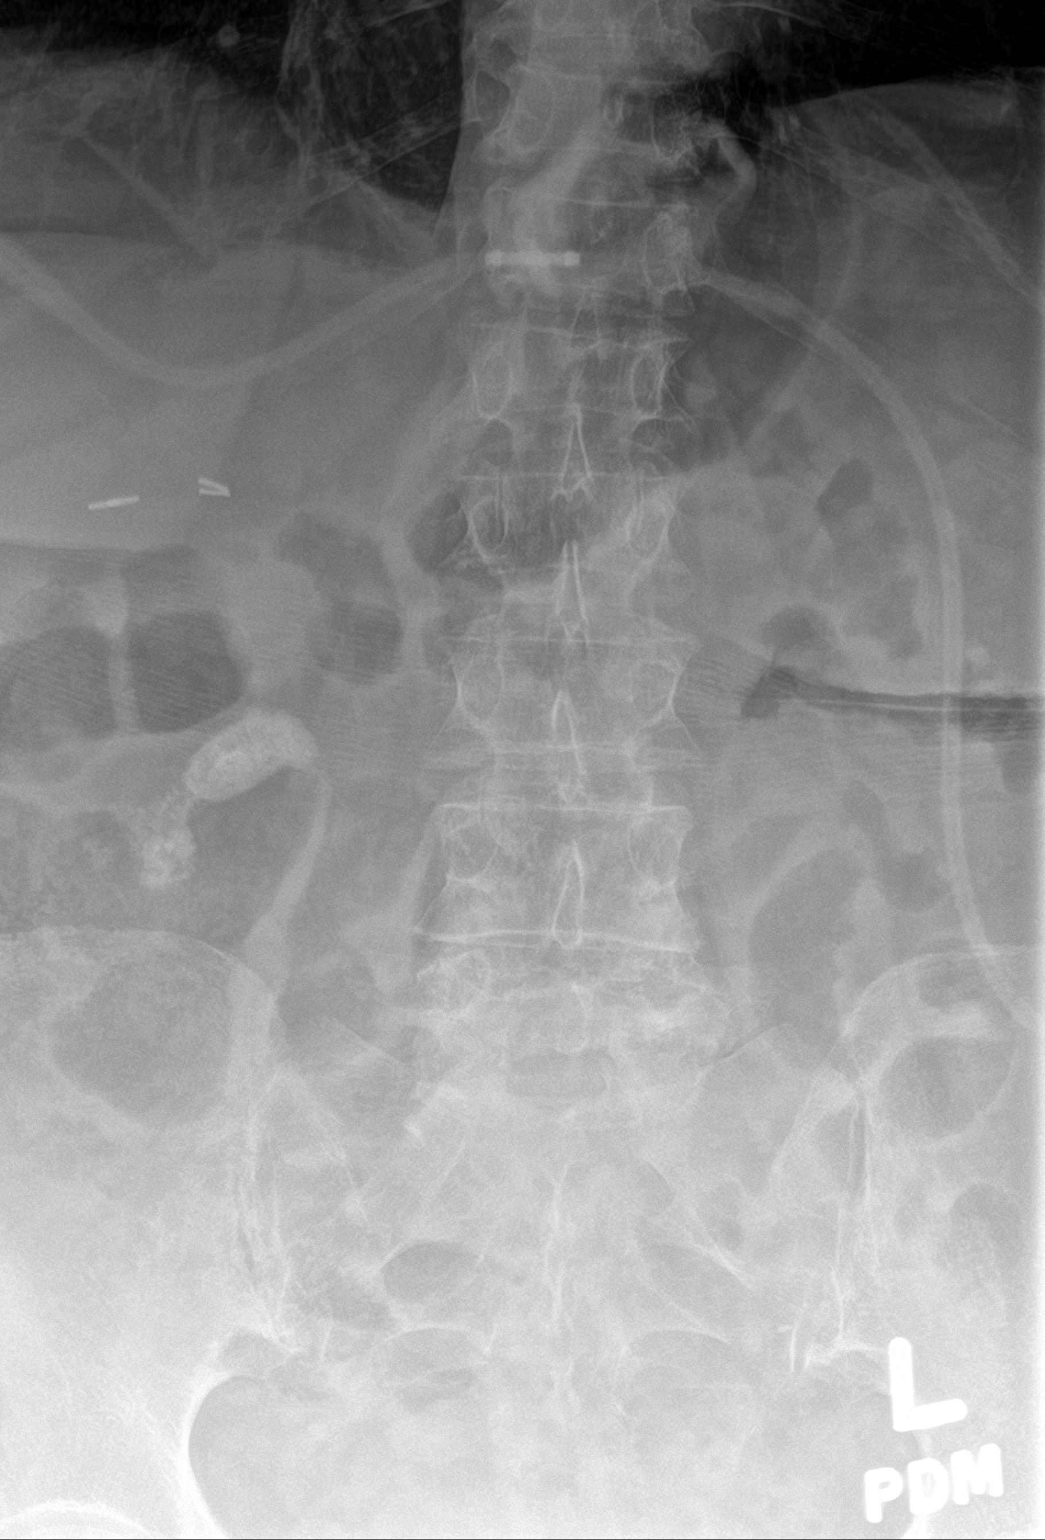

[l-spine lat]
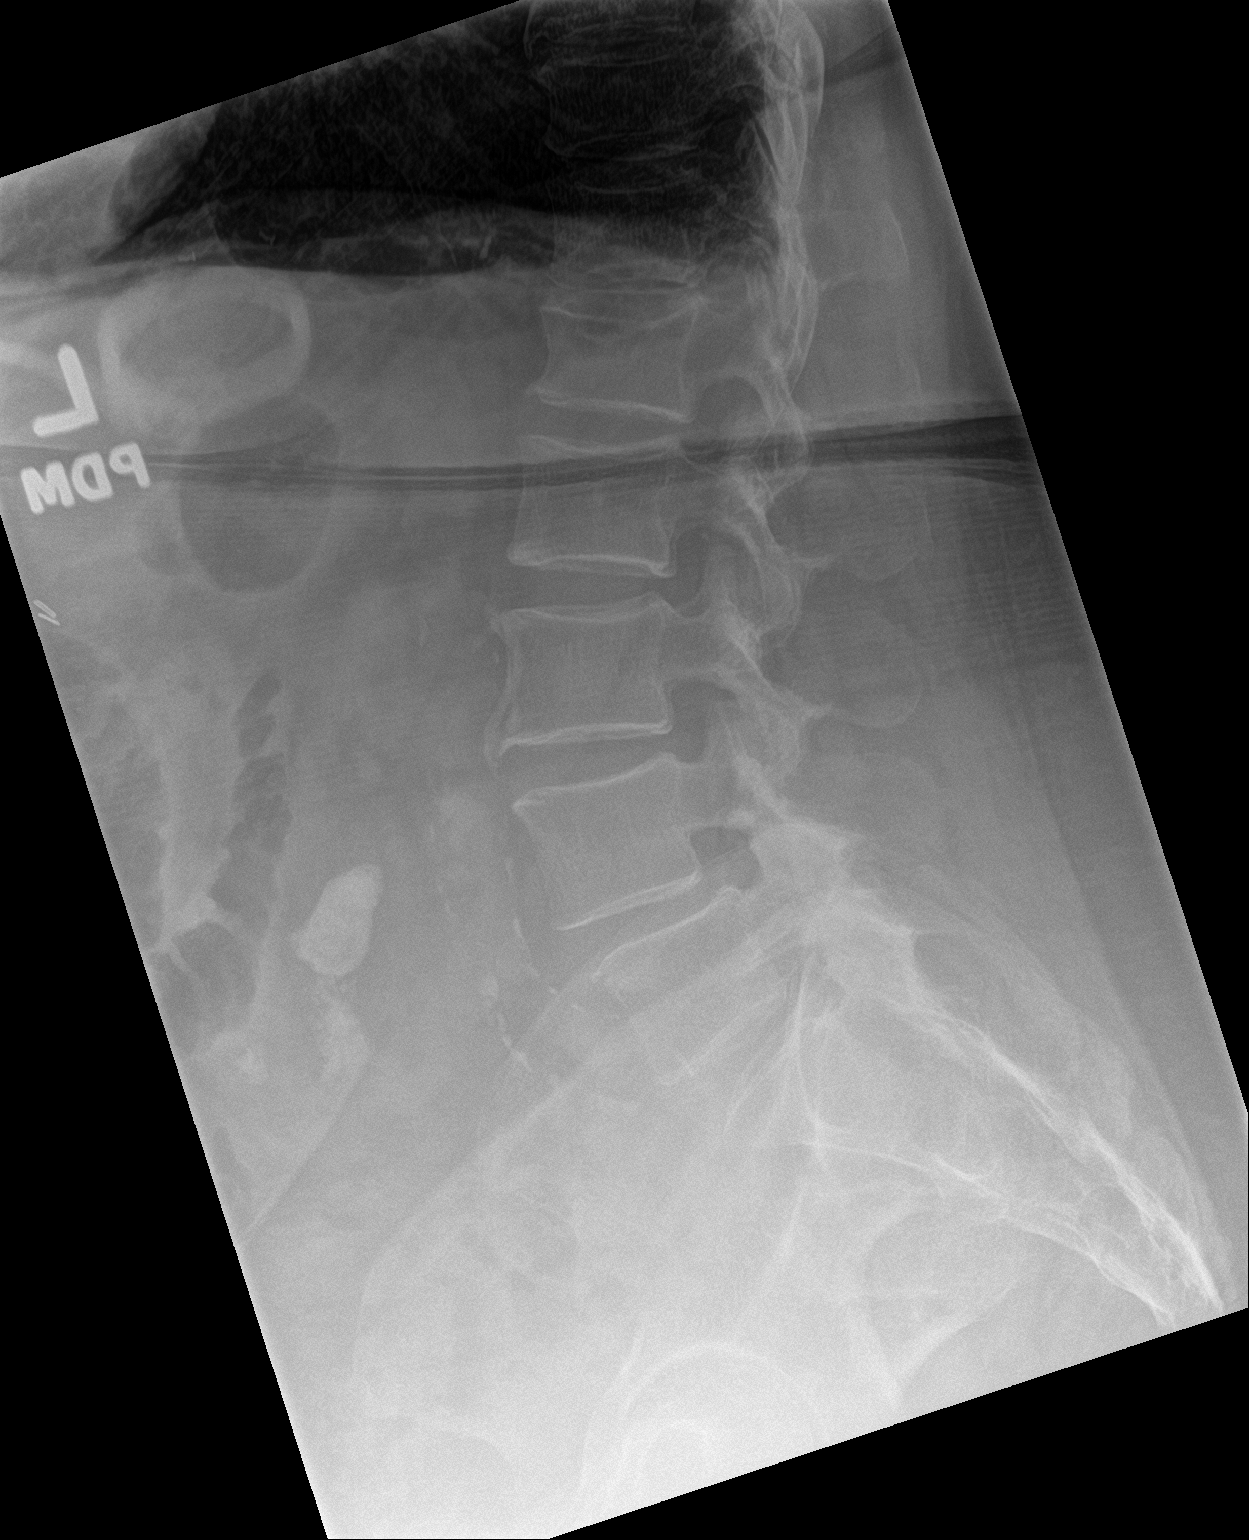

[l-spine spot]
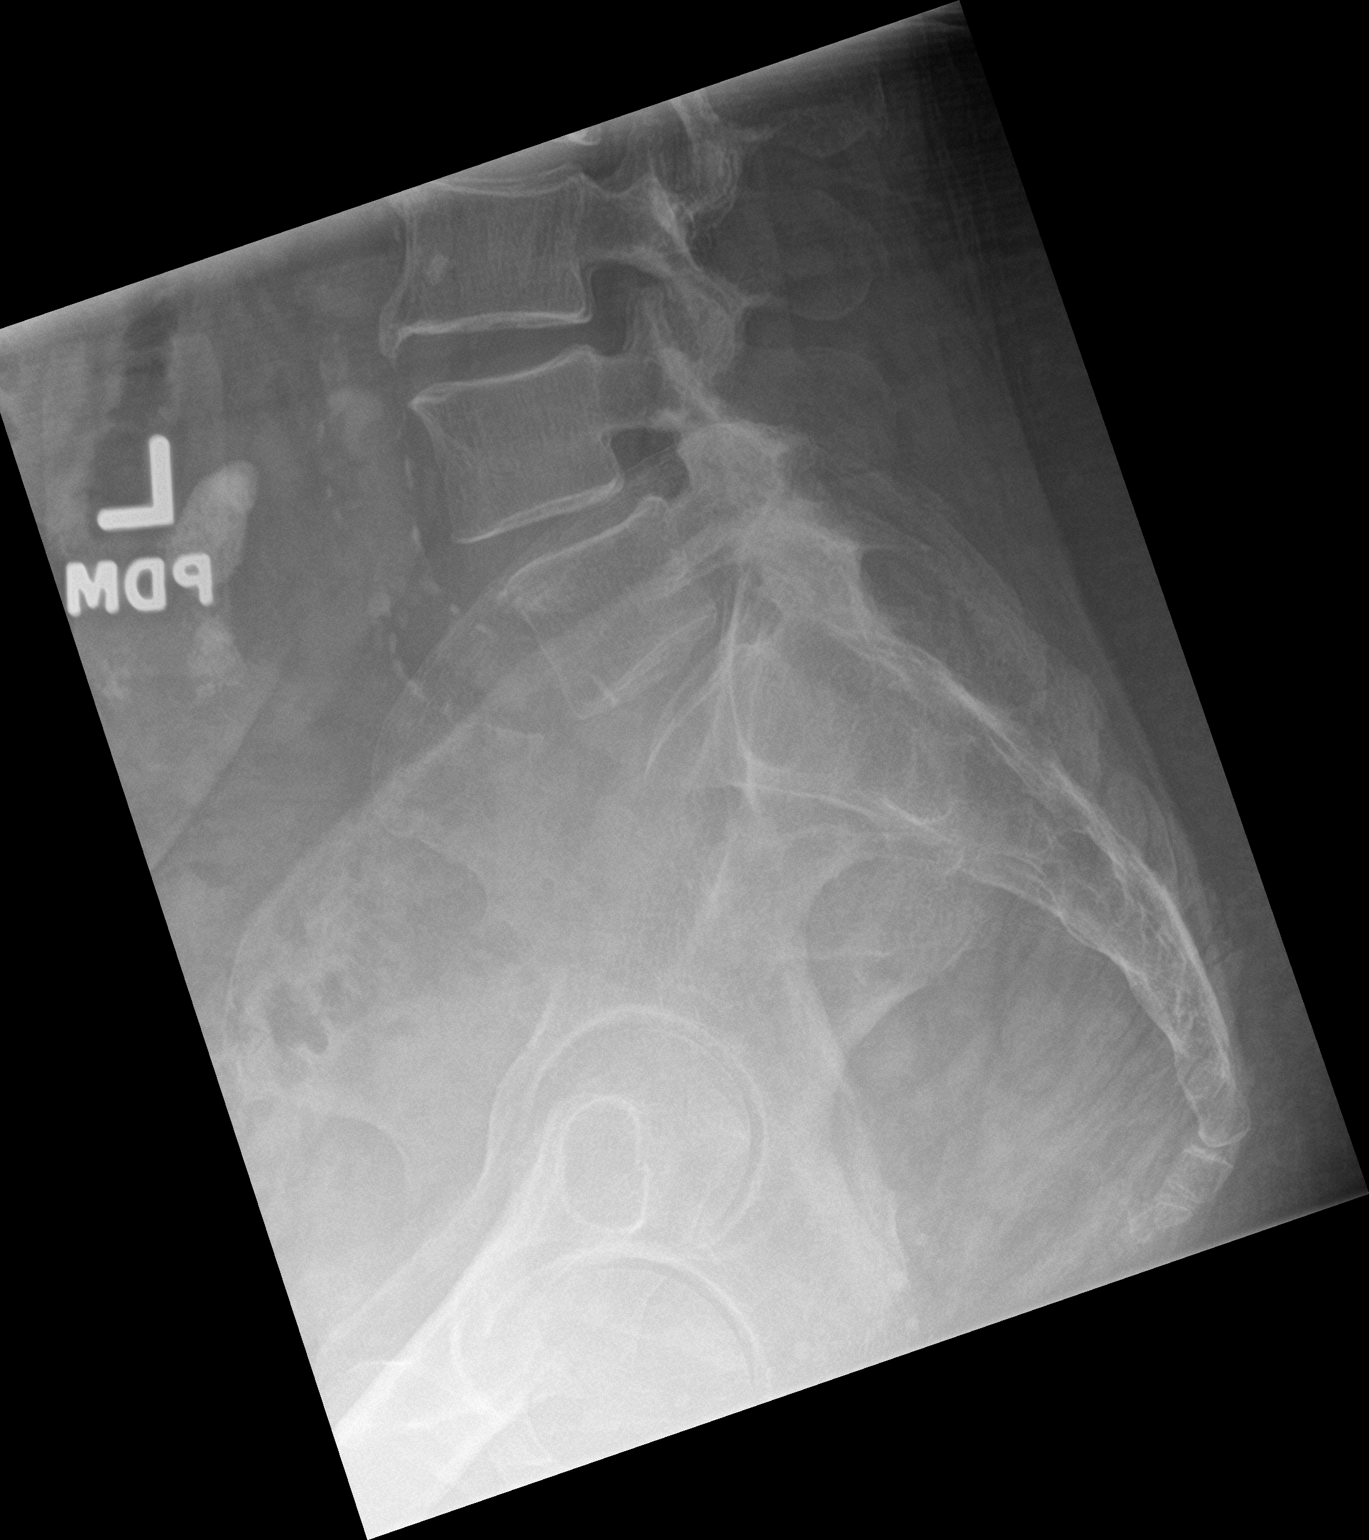

[3 of 3 positions shown; findings below may reference images not displayed]

FINDINGS: Mild, chronic L1 superior endplate compression fracture is
unchanged. Intervertebral disc space height is maintained. Facet
degenerative disease at L4-5 and L5-S1 is again seen and results in
0.5 cm anterolisthesis L4 on L5, unchanged. No focal bone lesion is
identified.

Imaged paraspinous structures demonstrate a calcification projecting
in the lower pole the right kidney and in the renal pelvis.
Calcification extends throughout the mid proximal right ureter to
just below the pelvic brim and is markedly increased in extent since
the prior exam. Atherosclerotic vascular disease is seen.
IMPRESSION: Facet degenerative disease L4-5 and L5-S1 results in 0.5 cm L4 on
L5.

Mild, remote L1 superior endplate compression fracture.

Intervertebral disc space height is maintained.

Calcification the right abdomen is likely due to a calcification
extending from the lower pole of the right kidney through the renal
pelvis and into the ureter to the level of the pelvic brim. If
indicated, CT abdomen and pelvis without contrast could be used for
further evaluation.

Atherosclerosis.

## 2019-09-19 ENCOUNTER — Telehealth: Payer: Self-pay | Admitting: Pulmonary Disease

## 2019-09-19 NOTE — Telephone Encounter (Signed)
Based of the information given and patient oxygen being 70 % on oxygen we advise for her to call EMS and go to the hospital. She refused and said all she wants is Prednisone nothing else and she will not go to the hospital. And hung up the phone.  Sending  To Dr. Vaughan Browner as fyi.

## 2019-09-20 NOTE — Telephone Encounter (Signed)
lmtcb for pt. Will send in prednisone after speaking to pt since this is not the top recommendation per Dr. Vaughan Browner.

## 2019-09-20 NOTE — Telephone Encounter (Signed)
Agree that The best advice is to go to ED. If she is refusing then call in prednisone 40 mg/day for 5 days

## 2019-09-21 DIAGNOSIS — M797 Fibromyalgia: Secondary | ICD-10-CM | POA: Diagnosis not present

## 2019-09-21 DIAGNOSIS — Z6841 Body Mass Index (BMI) 40.0 and over, adult: Secondary | ICD-10-CM | POA: Diagnosis not present

## 2019-09-21 DIAGNOSIS — Z9981 Dependence on supplemental oxygen: Secondary | ICD-10-CM | POA: Diagnosis not present

## 2019-09-21 DIAGNOSIS — J449 Chronic obstructive pulmonary disease, unspecified: Secondary | ICD-10-CM | POA: Diagnosis not present

## 2019-09-21 DIAGNOSIS — E559 Vitamin D deficiency, unspecified: Secondary | ICD-10-CM | POA: Diagnosis not present

## 2019-09-21 DIAGNOSIS — E669 Obesity, unspecified: Secondary | ICD-10-CM | POA: Diagnosis not present

## 2019-09-21 DIAGNOSIS — M6281 Muscle weakness (generalized): Secondary | ICD-10-CM | POA: Diagnosis not present

## 2019-09-21 DIAGNOSIS — K219 Gastro-esophageal reflux disease without esophagitis: Secondary | ICD-10-CM | POA: Diagnosis not present

## 2019-09-21 NOTE — Telephone Encounter (Signed)
Attempted to call pt but line went straight to VM. Left message for her to return call. 

## 2019-09-21 NOTE — Telephone Encounter (Signed)
lmom x1 

## 2019-09-21 NOTE — Telephone Encounter (Signed)
Pt called back about this. Please return call.

## 2019-09-22 ENCOUNTER — Other Ambulatory Visit: Payer: Self-pay | Admitting: Internal Medicine

## 2019-09-22 DIAGNOSIS — K219 Gastro-esophageal reflux disease without esophagitis: Secondary | ICD-10-CM | POA: Diagnosis not present

## 2019-09-22 DIAGNOSIS — E559 Vitamin D deficiency, unspecified: Secondary | ICD-10-CM | POA: Diagnosis not present

## 2019-09-22 DIAGNOSIS — M6281 Muscle weakness (generalized): Secondary | ICD-10-CM | POA: Diagnosis not present

## 2019-09-22 DIAGNOSIS — Z9981 Dependence on supplemental oxygen: Secondary | ICD-10-CM | POA: Diagnosis not present

## 2019-09-22 DIAGNOSIS — J9610 Chronic respiratory failure, unspecified whether with hypoxia or hypercapnia: Secondary | ICD-10-CM

## 2019-09-22 DIAGNOSIS — M797 Fibromyalgia: Secondary | ICD-10-CM | POA: Diagnosis not present

## 2019-09-22 DIAGNOSIS — Z6841 Body Mass Index (BMI) 40.0 and over, adult: Secondary | ICD-10-CM | POA: Diagnosis not present

## 2019-09-22 DIAGNOSIS — E669 Obesity, unspecified: Secondary | ICD-10-CM | POA: Diagnosis not present

## 2019-09-22 DIAGNOSIS — J449 Chronic obstructive pulmonary disease, unspecified: Secondary | ICD-10-CM | POA: Diagnosis not present

## 2019-09-22 NOTE — Telephone Encounter (Signed)
Attempted to call patient several times, will close encounter.

## 2019-09-25 ENCOUNTER — Telehealth: Payer: Self-pay | Admitting: Pulmonary Disease

## 2019-09-25 NOTE — Telephone Encounter (Signed)
Attempted to call patient, left message for patient to return call on 09/26/2019.

## 2019-09-26 DIAGNOSIS — E559 Vitamin D deficiency, unspecified: Secondary | ICD-10-CM | POA: Diagnosis not present

## 2019-09-26 DIAGNOSIS — E669 Obesity, unspecified: Secondary | ICD-10-CM | POA: Diagnosis not present

## 2019-09-26 DIAGNOSIS — Z6841 Body Mass Index (BMI) 40.0 and over, adult: Secondary | ICD-10-CM | POA: Diagnosis not present

## 2019-09-26 DIAGNOSIS — J449 Chronic obstructive pulmonary disease, unspecified: Secondary | ICD-10-CM | POA: Diagnosis not present

## 2019-09-26 DIAGNOSIS — M6281 Muscle weakness (generalized): Secondary | ICD-10-CM | POA: Diagnosis not present

## 2019-09-26 DIAGNOSIS — M797 Fibromyalgia: Secondary | ICD-10-CM | POA: Diagnosis not present

## 2019-09-26 DIAGNOSIS — Z9981 Dependence on supplemental oxygen: Secondary | ICD-10-CM | POA: Diagnosis not present

## 2019-09-26 DIAGNOSIS — K219 Gastro-esophageal reflux disease without esophagitis: Secondary | ICD-10-CM | POA: Diagnosis not present

## 2019-09-26 NOTE — Telephone Encounter (Signed)
LMTCB x2 for pt 

## 2019-09-26 NOTE — Telephone Encounter (Signed)
Spoke with patient she states that she has been getting 28 tanks E tanks for years and now [employer] is saying she can get them and use them in her home because she has a Conservation officer, nature. Patient states she called [employer] this morning and they told her they would cover the 28 tanks they just needed an order for them. I have sent a message to [facility] with [employer] to get a little more clarification on the situation. Will wait to hear back from her with more information.

## 2019-09-26 NOTE — Telephone Encounter (Signed)
Pt returning missed call. States that phone goes straight to vm when we call. States that when we call, she's going to call right back [phone]

## 2019-09-27 NOTE — Telephone Encounter (Signed)
[name] from [employer] called about this. Please return call.

## 2019-09-27 NOTE — Telephone Encounter (Signed)
Spoke with Rebecca [name]  She states that [employer] is not able to deliver the pt 28 o2 tanks  They have explained this to her multiple times  They have given her extra home fill tanks and homefill system so she can make her own tanks  If has further questions can call [phone] and ask to speak with LeRoy the pt to explain this and she did not answer- LMTCB

## 2019-09-28 DIAGNOSIS — K219 Gastro-esophageal reflux disease without esophagitis: Secondary | ICD-10-CM | POA: Diagnosis not present

## 2019-09-28 DIAGNOSIS — Z9981 Dependence on supplemental oxygen: Secondary | ICD-10-CM | POA: Diagnosis not present

## 2019-09-28 DIAGNOSIS — J449 Chronic obstructive pulmonary disease, unspecified: Secondary | ICD-10-CM | POA: Diagnosis not present

## 2019-09-28 DIAGNOSIS — Z6841 Body Mass Index (BMI) 40.0 and over, adult: Secondary | ICD-10-CM | POA: Diagnosis not present

## 2019-09-28 DIAGNOSIS — M797 Fibromyalgia: Secondary | ICD-10-CM | POA: Diagnosis not present

## 2019-09-28 DIAGNOSIS — E669 Obesity, unspecified: Secondary | ICD-10-CM | POA: Diagnosis not present

## 2019-09-28 DIAGNOSIS — E559 Vitamin D deficiency, unspecified: Secondary | ICD-10-CM | POA: Diagnosis not present

## 2019-09-28 DIAGNOSIS — M6281 Muscle weakness (generalized): Secondary | ICD-10-CM | POA: Diagnosis not present

## 2019-09-28 NOTE — Telephone Encounter (Signed)
[employer] pt, received a busy signal. Will try back.

## 2019-09-29 ENCOUNTER — Ambulatory Visit: Payer: Medicare HMO | Admitting: Pulmonary Disease

## 2019-09-29 ENCOUNTER — Telehealth: Payer: Self-pay | Admitting: Pulmonary Disease

## 2019-09-29 NOTE — Telephone Encounter (Signed)
Attempted to call patient several times to schedule appointment with NP with a walk. Please follow up on 10/02/2019

## 2019-09-29 NOTE — Telephone Encounter (Signed)
I don't see where PCC's have tried to call pt.  She has spoken to triage today.  Will route back to them.

## 2019-09-29 NOTE — Telephone Encounter (Signed)
Spoke with patient again with same complaint. She continues to call the DME company. She has two refillable tanks and the DME has ordered her two more. She has a concentrator and the ability to refill two tanks. She has spoke to management at the DME but is not satisfied without the green tanks. Left patient a detailed message.

## 2019-09-29 NOTE — Telephone Encounter (Signed)
Patient continues to call regarding her oxygen system. She has refillable oxygen tanks and she has called our office and the DME several times. Please see alternate encounters and refer patient back to her DME company.

## 2019-10-02 NOTE — Telephone Encounter (Signed)
Called Rebecca [name]- want to ensure needs walk. Had to [facility].

## 2019-10-03 ENCOUNTER — Telehealth: Payer: Medicare HMO | Admitting: Pulmonary Disease

## 2019-10-03 NOTE — Telephone Encounter (Signed)
Called and left message for patient to call and schedule and appointment for a qualifying walk in our office.

## 2019-10-04 DIAGNOSIS — K219 Gastro-esophageal reflux disease without esophagitis: Secondary | ICD-10-CM | POA: Diagnosis not present

## 2019-10-04 DIAGNOSIS — Z9981 Dependence on supplemental oxygen: Secondary | ICD-10-CM | POA: Diagnosis not present

## 2019-10-04 DIAGNOSIS — E669 Obesity, unspecified: Secondary | ICD-10-CM | POA: Diagnosis not present

## 2019-10-04 DIAGNOSIS — E559 Vitamin D deficiency, unspecified: Secondary | ICD-10-CM | POA: Diagnosis not present

## 2019-10-04 DIAGNOSIS — M6281 Muscle weakness (generalized): Secondary | ICD-10-CM | POA: Diagnosis not present

## 2019-10-04 DIAGNOSIS — J449 Chronic obstructive pulmonary disease, unspecified: Secondary | ICD-10-CM | POA: Diagnosis not present

## 2019-10-04 DIAGNOSIS — M797 Fibromyalgia: Secondary | ICD-10-CM | POA: Diagnosis not present

## 2019-10-04 DIAGNOSIS — Z6841 Body Mass Index (BMI) 40.0 and over, adult: Secondary | ICD-10-CM | POA: Diagnosis not present

## 2019-10-09 ENCOUNTER — Telehealth: Payer: Self-pay | Admitting: Internal Medicine

## 2019-10-09 NOTE — Telephone Encounter (Signed)
[name] w/[facility] need verbal orders to continue PT for 2 week 2 1 time for 2 weeks.  And she also need clarification on pt O2 2 liter at rest and 5 liters w/activity due to pt is not being compliant.  You can leave a detail msg on her secure voice mail.

## 2019-10-10 ENCOUNTER — Telehealth: Payer: Self-pay | Admitting: Cardiology

## 2019-10-10 DIAGNOSIS — K219 Gastro-esophageal reflux disease without esophagitis: Secondary | ICD-10-CM | POA: Diagnosis not present

## 2019-10-10 DIAGNOSIS — M797 Fibromyalgia: Secondary | ICD-10-CM | POA: Diagnosis not present

## 2019-10-10 DIAGNOSIS — Z6841 Body Mass Index (BMI) 40.0 and over, adult: Secondary | ICD-10-CM | POA: Diagnosis not present

## 2019-10-10 DIAGNOSIS — J449 Chronic obstructive pulmonary disease, unspecified: Secondary | ICD-10-CM | POA: Diagnosis not present

## 2019-10-10 DIAGNOSIS — Z9981 Dependence on supplemental oxygen: Secondary | ICD-10-CM | POA: Diagnosis not present

## 2019-10-10 DIAGNOSIS — E669 Obesity, unspecified: Secondary | ICD-10-CM | POA: Diagnosis not present

## 2019-10-10 DIAGNOSIS — E559 Vitamin D deficiency, unspecified: Secondary | ICD-10-CM | POA: Diagnosis not present

## 2019-10-10 DIAGNOSIS — M6281 Muscle weakness (generalized): Secondary | ICD-10-CM | POA: Diagnosis not present

## 2019-10-10 NOTE — Telephone Encounter (Signed)
Verbal orders for PT given to [facility]  Pulmonologist name and telephone number given.

## 2019-10-10 NOTE — Telephone Encounter (Signed)
Per clinical supervisor, patient would need to wear mask and face shield per policy. MyChart message sent

## 2019-10-10 NOTE — Telephone Encounter (Signed)
Patient wishes to cancel her appointment due to mask policy.

## 2019-10-10 NOTE — Telephone Encounter (Signed)
    Pt called, she has stage 4 COPD and can't wear mask she would like to know If she can wear face shield instead. She also added she is fully vaccinated

## 2019-10-10 NOTE — Telephone Encounter (Signed)
Ok for [facility] orders. Not sure what they want me to do about her noncompliance.Marland KitchenMarland Kitchen

## 2019-10-12 ENCOUNTER — Ambulatory Visit: Payer: Medicare HMO | Admitting: Cardiology

## 2019-10-12 DIAGNOSIS — M6281 Muscle weakness (generalized): Secondary | ICD-10-CM | POA: Diagnosis not present

## 2019-10-12 DIAGNOSIS — Z9981 Dependence on supplemental oxygen: Secondary | ICD-10-CM | POA: Diagnosis not present

## 2019-10-12 DIAGNOSIS — E559 Vitamin D deficiency, unspecified: Secondary | ICD-10-CM | POA: Diagnosis not present

## 2019-10-12 DIAGNOSIS — K219 Gastro-esophageal reflux disease without esophagitis: Secondary | ICD-10-CM | POA: Diagnosis not present

## 2019-10-12 DIAGNOSIS — J449 Chronic obstructive pulmonary disease, unspecified: Secondary | ICD-10-CM | POA: Diagnosis not present

## 2019-10-12 DIAGNOSIS — E669 Obesity, unspecified: Secondary | ICD-10-CM | POA: Diagnosis not present

## 2019-10-12 DIAGNOSIS — Z6841 Body Mass Index (BMI) 40.0 and over, adult: Secondary | ICD-10-CM | POA: Diagnosis not present

## 2019-10-12 DIAGNOSIS — M797 Fibromyalgia: Secondary | ICD-10-CM | POA: Diagnosis not present

## 2019-10-16 DIAGNOSIS — Z880 Allergy status to penicillin: Secondary | ICD-10-CM | POA: Diagnosis not present

## 2019-10-16 DIAGNOSIS — R682 Dry mouth, unspecified: Secondary | ICD-10-CM | POA: Diagnosis not present

## 2019-10-16 DIAGNOSIS — J3489 Other specified disorders of nose and nasal sinuses: Secondary | ICD-10-CM | POA: Diagnosis not present

## 2019-10-16 DIAGNOSIS — Z881 Allergy status to other antibiotic agents status: Secondary | ICD-10-CM | POA: Diagnosis not present

## 2019-10-16 DIAGNOSIS — H608X3 Other otitis externa, bilateral: Secondary | ICD-10-CM | POA: Diagnosis not present

## 2019-10-17 DIAGNOSIS — E559 Vitamin D deficiency, unspecified: Secondary | ICD-10-CM | POA: Diagnosis not present

## 2019-10-17 DIAGNOSIS — E669 Obesity, unspecified: Secondary | ICD-10-CM | POA: Diagnosis not present

## 2019-10-17 DIAGNOSIS — K219 Gastro-esophageal reflux disease without esophagitis: Secondary | ICD-10-CM | POA: Diagnosis not present

## 2019-10-17 DIAGNOSIS — J449 Chronic obstructive pulmonary disease, unspecified: Secondary | ICD-10-CM | POA: Diagnosis not present

## 2019-10-17 DIAGNOSIS — Z6841 Body Mass Index (BMI) 40.0 and over, adult: Secondary | ICD-10-CM | POA: Diagnosis not present

## 2019-10-17 DIAGNOSIS — M797 Fibromyalgia: Secondary | ICD-10-CM | POA: Diagnosis not present

## 2019-10-17 DIAGNOSIS — Z9981 Dependence on supplemental oxygen: Secondary | ICD-10-CM | POA: Diagnosis not present

## 2019-10-17 DIAGNOSIS — M6281 Muscle weakness (generalized): Secondary | ICD-10-CM | POA: Diagnosis not present

## 2019-10-19 DIAGNOSIS — E559 Vitamin D deficiency, unspecified: Secondary | ICD-10-CM | POA: Diagnosis not present

## 2019-10-19 DIAGNOSIS — K219 Gastro-esophageal reflux disease without esophagitis: Secondary | ICD-10-CM | POA: Diagnosis not present

## 2019-10-19 DIAGNOSIS — J449 Chronic obstructive pulmonary disease, unspecified: Secondary | ICD-10-CM | POA: Diagnosis not present

## 2019-10-19 DIAGNOSIS — E669 Obesity, unspecified: Secondary | ICD-10-CM | POA: Diagnosis not present

## 2019-10-19 DIAGNOSIS — Z9981 Dependence on supplemental oxygen: Secondary | ICD-10-CM | POA: Diagnosis not present

## 2019-10-19 DIAGNOSIS — M6281 Muscle weakness (generalized): Secondary | ICD-10-CM | POA: Diagnosis not present

## 2019-10-19 DIAGNOSIS — Z6841 Body Mass Index (BMI) 40.0 and over, adult: Secondary | ICD-10-CM | POA: Diagnosis not present

## 2019-10-19 DIAGNOSIS — M797 Fibromyalgia: Secondary | ICD-10-CM | POA: Diagnosis not present

## 2019-10-25 ENCOUNTER — Other Ambulatory Visit: Payer: Self-pay | Admitting: Internal Medicine

## 2019-10-25 DIAGNOSIS — F419 Anxiety disorder, unspecified: Secondary | ICD-10-CM

## 2019-11-02 DIAGNOSIS — E669 Obesity, unspecified: Secondary | ICD-10-CM | POA: Diagnosis not present

## 2019-11-02 DIAGNOSIS — J449 Chronic obstructive pulmonary disease, unspecified: Secondary | ICD-10-CM | POA: Diagnosis not present

## 2019-11-02 DIAGNOSIS — Z9981 Dependence on supplemental oxygen: Secondary | ICD-10-CM | POA: Diagnosis not present

## 2019-11-02 DIAGNOSIS — E559 Vitamin D deficiency, unspecified: Secondary | ICD-10-CM | POA: Diagnosis not present

## 2019-11-02 DIAGNOSIS — Z6841 Body Mass Index (BMI) 40.0 and over, adult: Secondary | ICD-10-CM | POA: Diagnosis not present

## 2019-11-02 DIAGNOSIS — K219 Gastro-esophageal reflux disease without esophagitis: Secondary | ICD-10-CM | POA: Diagnosis not present

## 2019-11-02 DIAGNOSIS — M797 Fibromyalgia: Secondary | ICD-10-CM | POA: Diagnosis not present

## 2019-11-02 DIAGNOSIS — M6281 Muscle weakness (generalized): Secondary | ICD-10-CM | POA: Diagnosis not present

## 2019-11-14 DIAGNOSIS — E669 Obesity, unspecified: Secondary | ICD-10-CM | POA: Diagnosis not present

## 2019-11-14 DIAGNOSIS — K219 Gastro-esophageal reflux disease without esophagitis: Secondary | ICD-10-CM | POA: Diagnosis not present

## 2019-11-14 DIAGNOSIS — Z9981 Dependence on supplemental oxygen: Secondary | ICD-10-CM | POA: Diagnosis not present

## 2019-11-14 DIAGNOSIS — Z87891 Personal history of nicotine dependence: Secondary | ICD-10-CM | POA: Diagnosis not present

## 2019-11-14 DIAGNOSIS — E559 Vitamin D deficiency, unspecified: Secondary | ICD-10-CM | POA: Diagnosis not present

## 2019-11-14 DIAGNOSIS — G4733 Obstructive sleep apnea (adult) (pediatric): Secondary | ICD-10-CM | POA: Diagnosis not present

## 2019-11-14 DIAGNOSIS — M797 Fibromyalgia: Secondary | ICD-10-CM | POA: Diagnosis not present

## 2019-11-14 DIAGNOSIS — Z6841 Body Mass Index (BMI) 40.0 and over, adult: Secondary | ICD-10-CM | POA: Diagnosis not present

## 2019-11-14 DIAGNOSIS — J439 Emphysema, unspecified: Secondary | ICD-10-CM | POA: Diagnosis not present

## 2019-11-16 DIAGNOSIS — M797 Fibromyalgia: Secondary | ICD-10-CM | POA: Diagnosis not present

## 2019-11-16 DIAGNOSIS — E559 Vitamin D deficiency, unspecified: Secondary | ICD-10-CM | POA: Diagnosis not present

## 2019-11-16 DIAGNOSIS — Z9981 Dependence on supplemental oxygen: Secondary | ICD-10-CM | POA: Diagnosis not present

## 2019-11-16 DIAGNOSIS — J439 Emphysema, unspecified: Secondary | ICD-10-CM | POA: Diagnosis not present

## 2019-11-16 DIAGNOSIS — Z87891 Personal history of nicotine dependence: Secondary | ICD-10-CM | POA: Diagnosis not present

## 2019-11-16 DIAGNOSIS — E669 Obesity, unspecified: Secondary | ICD-10-CM | POA: Diagnosis not present

## 2019-11-16 DIAGNOSIS — Z6841 Body Mass Index (BMI) 40.0 and over, adult: Secondary | ICD-10-CM | POA: Diagnosis not present

## 2019-11-16 DIAGNOSIS — G4733 Obstructive sleep apnea (adult) (pediatric): Secondary | ICD-10-CM | POA: Diagnosis not present

## 2019-11-16 DIAGNOSIS — K219 Gastro-esophageal reflux disease without esophagitis: Secondary | ICD-10-CM | POA: Diagnosis not present

## 2019-11-22 ENCOUNTER — Telehealth: Payer: Self-pay | Admitting: Pulmonary Disease

## 2019-11-22 DIAGNOSIS — K219 Gastro-esophageal reflux disease without esophagitis: Secondary | ICD-10-CM | POA: Diagnosis not present

## 2019-11-22 DIAGNOSIS — M797 Fibromyalgia: Secondary | ICD-10-CM | POA: Diagnosis not present

## 2019-11-22 DIAGNOSIS — Z87891 Personal history of nicotine dependence: Secondary | ICD-10-CM | POA: Diagnosis not present

## 2019-11-22 DIAGNOSIS — Z6841 Body Mass Index (BMI) 40.0 and over, adult: Secondary | ICD-10-CM | POA: Diagnosis not present

## 2019-11-22 DIAGNOSIS — J439 Emphysema, unspecified: Secondary | ICD-10-CM | POA: Diagnosis not present

## 2019-11-22 DIAGNOSIS — E669 Obesity, unspecified: Secondary | ICD-10-CM | POA: Diagnosis not present

## 2019-11-22 DIAGNOSIS — E559 Vitamin D deficiency, unspecified: Secondary | ICD-10-CM | POA: Diagnosis not present

## 2019-11-22 DIAGNOSIS — G4733 Obstructive sleep apnea (adult) (pediatric): Secondary | ICD-10-CM | POA: Diagnosis not present

## 2019-11-22 DIAGNOSIS — Z9981 Dependence on supplemental oxygen: Secondary | ICD-10-CM | POA: Diagnosis not present

## 2019-11-22 NOTE — Telephone Encounter (Signed)
Spoke with the pt  She states she has been doing PT and her pulse rate has been dropping into the 40's so they rec that she see cards  We had referred her before in April 2021  She was scheduled appts with cards but cancelled due to mask policy and the referral was then cancelled  She asked about a new referral and I advised that the mask policy is still in effect  She then stated that there would be no point in going through with a referral now bc she is not willing to wear a mask  I advised that I would let Dr Vaughan Browner know this

## 2019-11-29 ENCOUNTER — Telehealth: Payer: Self-pay | Admitting: Internal Medicine

## 2019-11-29 DIAGNOSIS — J439 Emphysema, unspecified: Secondary | ICD-10-CM | POA: Diagnosis not present

## 2019-11-29 DIAGNOSIS — Z9981 Dependence on supplemental oxygen: Secondary | ICD-10-CM | POA: Diagnosis not present

## 2019-11-29 DIAGNOSIS — R0602 Shortness of breath: Secondary | ICD-10-CM

## 2019-11-29 DIAGNOSIS — E559 Vitamin D deficiency, unspecified: Secondary | ICD-10-CM | POA: Diagnosis not present

## 2019-11-29 DIAGNOSIS — R600 Localized edema: Secondary | ICD-10-CM

## 2019-11-29 DIAGNOSIS — Z6841 Body Mass Index (BMI) 40.0 and over, adult: Secondary | ICD-10-CM | POA: Diagnosis not present

## 2019-11-29 DIAGNOSIS — G4733 Obstructive sleep apnea (adult) (pediatric): Secondary | ICD-10-CM | POA: Diagnosis not present

## 2019-11-29 DIAGNOSIS — M797 Fibromyalgia: Secondary | ICD-10-CM | POA: Diagnosis not present

## 2019-11-29 DIAGNOSIS — E669 Obesity, unspecified: Secondary | ICD-10-CM | POA: Diagnosis not present

## 2019-11-29 DIAGNOSIS — Z87891 Personal history of nicotine dependence: Secondary | ICD-10-CM | POA: Diagnosis not present

## 2019-11-29 DIAGNOSIS — K219 Gastro-esophageal reflux disease without esophagitis: Secondary | ICD-10-CM | POA: Diagnosis not present

## 2019-11-29 DIAGNOSIS — R001 Bradycardia, unspecified: Secondary | ICD-10-CM

## 2019-11-29 NOTE — Telephone Encounter (Signed)
[name] with [facility] [phone] been seeing the patient for 8wks now for PT and is not improving.She thinks its due her heart and patient heart rate when exercising 35-39 range and want to know if the patient can get a referral for cardiology. She also will be discharging the patient until she sees a cardiologist. Please call [name] and she can explain more.

## 2019-11-30 NOTE — Telephone Encounter (Signed)
Whether she is seen here or at cardiology, she will need to wear a mask.Marland KitchenMarland Kitchen

## 2019-11-30 NOTE — Telephone Encounter (Signed)
Left message on machine for patient to return our call 

## 2019-11-30 NOTE — Telephone Encounter (Signed)
Pt returned your call. I was going to schedule an in person visit but patient states she can't wear a mask. She would like a call back.

## 2019-11-30 NOTE — Telephone Encounter (Signed)
Can we get her into the office for an in-person visit?

## 2019-12-01 ENCOUNTER — Telehealth: Payer: Self-pay | Admitting: General Practice

## 2019-12-01 NOTE — Telephone Encounter (Signed)
Called patient- advised from Bernardo Heater, RN that it would be okay for her to wear the mask walking through clinic- but once she gets into the room, she can wear the face shield during the appointment.   Patient states that she is unable to walk up the stairs so she will need assistance with a wheelchair when she gets to the office for the appointment when it is scheduled. I advised that I would send a message to the nurse for Dr.Harding to make her aware and possibly see what options we have to assist her as she does not have anyone who can come with her.   Will route to scheduling to have them set up the appointment, and to nurse to make her aware.   Patient thankful for call back.

## 2019-12-01 NOTE — Telephone Encounter (Signed)
Spoke with patient and she will call back if she would like a referral to cardiology.

## 2019-12-01 NOTE — Telephone Encounter (Signed)
Patient has a referral that's been sent over. Patient states that she cannot wear a face mask the whole time. She wants to know if she schedules can she wear a face mask and face shield and once she gets into the room with the doctor if she can remove her mask and still use the shield as protection. See phone note from 10/10/19 - same issue.

## 2019-12-01 NOTE — Telephone Encounter (Signed)
Would this be okay??   See visit note from 8/10- patient would have face shield on while in room as well.   Thank you!

## 2019-12-08 ENCOUNTER — Telehealth: Payer: Self-pay | Admitting: Pulmonary Disease

## 2019-12-08 NOTE — Telephone Encounter (Signed)
Also stated that she has appt with cardiologist 11/4

## 2019-12-08 NOTE — Telephone Encounter (Signed)
Called and spoke with patient. She stated that she had a reaction to Daliresp a few months ago and was advised to stop taking the medication. She has contacted the [employer] several times to get them to stop sending the medication. She wanted to know if she could bring the unused portions by the office.   Spoke with Ander Purpura and was advised that we can not receive the medication. Patient is aware of this. Did advise her to check to see if [facility] [place] could use the medication.   Nothing further needed at time of call.

## 2019-12-15 ENCOUNTER — Telehealth: Payer: Self-pay | Admitting: Pulmonary Disease

## 2019-12-15 MED ORDER — PREDNISONE 20 MG PO TABS: 40.0000 mg | ORAL_TABLET | Freq: Every day | ORAL | 0 refills | Status: DC

## 2019-12-15 NOTE — Telephone Encounter (Signed)
Spoke with the pt  She is c/o increased DOE for the past several weeks  She states she gets winded walking across the room sometimes on her o2 5lpm  Her sats have not dipped below 91% 5lpm  She has been having some pain in the middle of her back the past 2 days and wanted me to mention that as well  She denies any wheezing or cough f/c/s, new body aches  She states still taking all of her prescribed meds as directed  Has had one of her covid vaccines  Please advise, thanks!

## 2019-12-15 NOTE — Telephone Encounter (Signed)
Send in prescription for prednisone 40 mg/day for 5 days

## 2019-12-15 NOTE — Telephone Encounter (Signed)
Patient is aware of recommendations and voiced her understanding. Rx for prednisone 20mg --2 tabs daily for 5 days has been phoned in to [place] [facility]. Nothing further needed.

## 2020-01-01 ENCOUNTER — Other Ambulatory Visit: Payer: Self-pay | Admitting: Pulmonary Disease

## 2020-01-02 ENCOUNTER — Telehealth (INDEPENDENT_AMBULATORY_CARE_PROVIDER_SITE_OTHER): Payer: Medicare HMO | Admitting: Internal Medicine

## 2020-01-02 DIAGNOSIS — J449 Chronic obstructive pulmonary disease, unspecified: Secondary | ICD-10-CM | POA: Diagnosis not present

## 2020-01-02 DIAGNOSIS — G894 Chronic pain syndrome: Secondary | ICD-10-CM | POA: Diagnosis not present

## 2020-01-02 DIAGNOSIS — R001 Bradycardia, unspecified: Secondary | ICD-10-CM | POA: Diagnosis not present

## 2020-01-02 DIAGNOSIS — M7120 Synovial cyst of popliteal space [Baker], unspecified knee: Secondary | ICD-10-CM

## 2020-01-02 DIAGNOSIS — K146 Glossodynia: Secondary | ICD-10-CM | POA: Diagnosis not present

## 2020-01-02 MED ORDER — HYDROCODONE-ACETAMINOPHEN 5-325 MG PO TABS: 1.0000 | ORAL_TABLET | Freq: Four times a day (QID) | ORAL | 0 refills | Status: DC | PRN

## 2020-01-02 NOTE — Progress Notes (Signed)
Virtual Visit via Video Note  I connected with Rebecca [name] on 01/02/20 at  4:00 PM EDT by a video enabled telemedicine application and verified that I am speaking with the correct person using two identifiers.  Location patient: home Location provider: work office Persons participating in the virtual visit: patient, provider  I discussed the limitations of evaluation and management by telemedicine and the availability of in person appointments. The patient expressed understanding and agreed to proceed.   HPI: She has scheduled this visit today to discuss some acute concerns.  I have not seen due to her concerns with Covid and her inability to wear a mask as she believes it causes COPD exacerbation.  She has advanced stage IV COPD.  All of her health maintenance items are overdue.  She has scheduled this visit to discuss:  1.  She saw Dr. Sarajane Jews a few months ago due to leg swelling.  Ultrasound was ordered that was negative for DVT but did show a Baker's cyst.  She feels like she no longer has swelling, this has completely resolved.  She thinks that this is more related to gout as it improves after she takes her colchicine.  2.  She is having a sore on the left side of her tongue.  She wonders if there is anything she can do for this.  3.  She notes that her physical therapist has decided to decline further home visits as she has been found to be bradycardic on several occasions and was told she needed to see the cardiologist before they could continue.  She was told her heart rate was as low as 29.  She has not had palpitations, dizziness, lightheadedness, certainly no syncopal or presyncopal episodes.  She is scheduled to see Dr. Ellyn Hack next Thursday.  4.  She has been having all body aches and pains, this is chronic.  She uses oxycodone for this very sparingly.  She is using a prescription from her prior PCP Dr. Lenna Gilford from 2014.  And she just ran out of the medication.  She is  wondering if I would not mind refilling pain medication for her.   ROS: Constitutional: Denies fever, chills, diaphoresis, appetite change. HEENT: Denies photophobia, eye pain, redness, hearing loss, ear pain, congestion, sore throat, rhinorrhea, sneezing, trouble swallowing, neck pain, neck stiffness and tinnitus.   Respiratory: Denies cough, chest tightness,  and wheezing.   Cardiovascular: Denies chest pain, palpitations and leg swelling.  Gastrointestinal: Denies nausea, vomiting, abdominal pain, diarrhea, constipation, blood in stool and abdominal distention.  Genitourinary: Denies dysuria, urgency, frequency, hematuria, flank pain and difficulty urinating.  Endocrine: Denies: hot or cold intolerance, sweats, changes in hair or nails, polyuria, polydipsia. Musculoskeletal: Denies myalgias, joint swelling, arthralgias and gait problem.  Skin: Denies pallor, rash and wound.  Neurological: Denies dizziness, seizures, syncope, weakness, light-headedness, numbness and headaches.  Hematological: Denies adenopathy. Easy bruising, personal or family bleeding history  Psychiatric/Behavioral: Denies suicidal ideation, mood changes, confusion, nervousness, sleep disturbance and agitation   Past Medical History:  Diagnosis Date   Chest pain    Chronic venous insufficiency    COPD (chronic obstructive pulmonary disease) ([name])    DJD (degenerative joint disease)    Dysthymia    Fibromyalgia    GERD (gastroesophageal reflux disease)    History of headache    Hodgkin lymphoma of extranodal or solid organ site [facility])    Hypothyroid    IBS (irritable bowel syndrome)    Metabolic syndrome X  Morbid obesity ([place])    OSA (obstructive sleep apnea)    Vitamin D deficiency     Past Surgical History:  Procedure Laterality Date   APPENDECTOMY     BILATERAL SALPINGOOPHORECTOMY  2007   [facility]   CHOLECYSTECTOMY  1982   LAPAROSCOPIC GASTRIC BANDING  08/2007   Dr. 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 Her smoking use included cigarettes. She started smoking about 57 years ago. She has a 29.00 pack-year smoking history. She has never used smokeless tobacco. She reports that she does not drink alcohol and does not use drugs.   Current Outpatient Medications:    albuterol (VENTOLIN HFA) 108 (90 Base) MCG/ACT inhaler, INHALE 2 PUFFS INTO THE LUNGS EVERY 6 HOURS AS NEEDED FOR WHEEZING OR SHORTNESS OF BREATH, Disp: 18 g, Rfl: 11   ALPRAZolam (XANAX) 0.5 MG tablet, TAKE ONE TABLET BY MOUTH THREE TIMES A DAY AS NEEDED FOR NERVES MAXIMUM DAILY DOSE OF THREE TABLETS, Disp: 90 tablet, Rfl: 0   Apple Cider Vinegar 500 MG TABS, Take 1,000 mg by mouth daily., Disp: , Rfl:    Ascorbic Acid (VITAMIN C) 1000 MG tablet, Take 1,000 mg by mouth daily., Disp: , Rfl:    azithromycin (ZITHROMAX) 250 MG tablet, TAKE ONE TABLET BY MOUTH DAILY, Disp: 30 tablet, Rfl: 3   baclofen (LIORESAL) 10 MG tablet, Take 10 mg by mouth. Taking 1 nightly, Disp: , Rfl:    cetirizine (ZYRTEC) 10 MG tablet, Take 10 mg by mouth daily.  , Disp: , Rfl:    Cholecalciferol (VITAMIN D-3) 5000 UNITS TABS, Take 1 tablet by mouth daily. Per pt medication list, takes 1,000, Disp: , Rfl:    CINNAMON PO, Take 325 mg by mouth every morning. Takes 2 every am, Disp: , Rfl:    COLCRYS 0.6 MG tablet, Take 1 tablet (0.6 mg total) by mouth 2 (two) times daily., Disp: 60 tablet, Rfl: 2   cyanocobalamin 2000 MCG tablet, Take 2,500 mcg by mouth 2 (two) times daily., Disp: , Rfl:    escitalopram (LEXAPRO) 10 MG tablet, Take 1 tablet (10 mg total) by mouth daily., Disp: 90 tablet, Rfl: 1   fluticasone (FLONASE) 50 MCG/ACT nasal spray, Place 2  sprays into both nostrils daily as needed. , Disp: , Rfl:    Fluticasone-Umeclidin-Vilant (TRELEGY ELLIPTA) 100-62.5-25 MCG/INH AEPB, Inhale 1 puff into the lungs daily., Disp: 60 each, Rfl: 3   furosemide (LASIX) 40 MG tablet, TAKE 1 TO 2 TABLETS BY MOUTH DAILY AS NEEDED FOR SWELLING, Disp: 180 tablet, Rfl: 0   glucosamine-chondroitin 500-400 MG tablet, Take 2 tablets by mouth 2 (two) times daily. , Disp: , Rfl:    guaiFENesin (MUCINEX) 600 MG 12 hr tablet, Take 400 mg by mouth 2 (two) times daily. Tales 3 tab;es twice a day, Disp: , Rfl:    HYDROcodone-acetaminophen (NORCO) 5-325 MG tablet, Take 1 tablet by mouth every 6 (six) hours as needed for moderate pain., Disp: 20 tablet, Rfl: 0   ipratropium-albuterol (DUONEB) 0.5-2.5 (3) MG/3ML SOLN, INHALE ONE VIAL VIA NEBULIZATION BY MOUTH THREE TIMES A DAY, Disp: 3 mL, Rfl: 2  ipratropium-albuterol (DUONEB) 0.5-2.5 (3) MG/3ML SOLN, INHALE 1 VIAL VIA NEBULIZATION 3 TIMES DAILY, Disp: 270 mL, Rfl: 11   loperamide (IMODIUM) 2 MG capsule, Take 1 capsule (2 mg total) by mouth 4 (four) times daily as needed for diarrhea or loose stools., Disp: 12 capsule, Rfl: 0   Magnesium 400 MG CAPS, Take 3 capsules by mouth 2 (two) times daily. , Disp: , Rfl:    Misc Natural Products (TART CHERRY ADVANCED PO), Take by mouth., Disp: , Rfl:    ondansetron (ZOFRAN ODT) 4 MG disintegrating tablet, Take 1 tablet (4 mg total) by mouth every 8 (eight) hours as needed for nausea or vomiting., Disp: 20 tablet, Rfl: 0   OXYGEN, Inhale 4 L into the lungs., Disp: , Rfl:    predniSONE (DELTASONE) 20 MG tablet, Take 2 tablets (40 mg total) by mouth daily with breakfast., Disp: 10 tablet, Rfl: 0   sertraline (ZOLOFT) 50 MG tablet, Take 1 tablet (50 mg total) by mouth daily., Disp: 90 tablet, Rfl: 1   sodium chloride HYPERTONIC 3 % nebulizer solution, Take by nebulization 2 (two) times daily. (Patient not taking: Reported on 08/28/2019), Disp: 750 mL, Rfl: 12    Spacer/Aero-Holding Chambers (AEROCHAMBER MV) inhaler, Use as instructed, Disp: 1 each, Rfl: 0   traMADol (ULTRAM) 50 MG tablet, Take 1 tablet (50 mg total) by mouth 3 (three) times daily as needed for moderate pain., Disp: 90 tablet, Rfl: 3   TURMERIC PO, Take 500 mg by mouth 2 (two) times daily. Takes 1,000 mg, Disp: , Rfl:    vitamin A 10000 UNIT capsule, Take 5,000 Units by mouth daily. Per pt list - takes 25,000, Disp: , Rfl:   EXAM:   VITALS per patient if applicable: Sats of 85% on room air while sitting.  GENERAL: alert, oriented, appears well and in no acute distress  HEENT: atraumatic, conjunttiva clear, no obvious abnormalities on inspection of external nose and ears  NECK: normal movements of the head and neck  LUNGS: on inspection no signs of respiratory distress, breathing rate appears normal, no obvious gross increased work of breathing, gasping or wheezing  CV: no obvious cyanosis  MS: moves all visible extremities without noticeable abnormality  PSYCH/NEURO: pleasant and cooperative, no obvious depression or anxiety, speech and thought processing grossly intact  ASSESSMENT AND PLAN:   Synovial cyst of popliteal space, unspecified laterality -Per patient report, not causing any pain or swelling, she would like to defer sports medicine/Ortho referral for now.  Tongue sore -Not easily seen on video camera on today's visit. -I wonder about B complex vitamin deficiency, also possible rubbing from dentures. -Have advised B complex vitamin, good dental hygiene, dental visit which I know is difficult for her given her COPD and the ongoing pandemic.  Bradycardia -With heart rates per report as low as 29, completely asymptomatic. -She has follow-up with cardiology later this week.  COPD mixed type ([place]) -Advanced, stage IV, oxygen dependent, followed by pulmonary.  Chronic pain syndrome -PDMP reviewed, no red flags, overdose risk score is 150. -I have agreed to  prescribe 20 tablets of hydrocodone for her to use quite sparingly for chronic back and joint pain.    I discussed the assessment and treatment plan with the patient. The patient was provided an opportunity to ask questions and all were answered. The patient agreed with the plan and demonstrated an understanding of the instructions.   The patient was advised to call back or seek an in-person evaluation if the  symptoms worsen or if the condition fails to improve as anticipated.    Lelon Frohlich, MD  [facility] at [facility]

## 2020-01-04 ENCOUNTER — Telehealth: Payer: Self-pay | Admitting: Radiology

## 2020-01-04 ENCOUNTER — Encounter: Payer: Self-pay | Admitting: Cardiology

## 2020-01-04 ENCOUNTER — Ambulatory Visit: Payer: Medicare HMO | Admitting: Cardiology

## 2020-01-04 ENCOUNTER — Other Ambulatory Visit: Payer: Self-pay

## 2020-01-04 VITALS — BP 145/83 | HR 110 | Temp 96.6°F | Ht 71.0 in | Wt 254.6 lb

## 2020-01-04 DIAGNOSIS — R001 Bradycardia, unspecified: Secondary | ICD-10-CM | POA: Diagnosis not present

## 2020-01-04 DIAGNOSIS — I872 Venous insufficiency (chronic) (peripheral): Secondary | ICD-10-CM

## 2020-01-04 DIAGNOSIS — J449 Chronic obstructive pulmonary disease, unspecified: Secondary | ICD-10-CM | POA: Diagnosis not present

## 2020-01-04 DIAGNOSIS — I5189 Other ill-defined heart diseases: Secondary | ICD-10-CM | POA: Diagnosis not present

## 2020-01-04 DIAGNOSIS — R0789 Other chest pain: Secondary | ICD-10-CM | POA: Diagnosis not present

## 2020-01-04 NOTE — Telephone Encounter (Signed)
Enrolled patient for a 7 day Zio XT monitor to be mailed to patients home.  

## 2020-01-04 NOTE — Patient Instructions (Signed)
Medication Instructions:  NO CHANGES  Lab Work: NOT NEEDED   Testing/Procedures: Your physician has recommended that you wear a 7  DAY ZIO-PATCH monitor. The Zio patch cardiac monitor continuously records heart rhythm data, this is for patients being evaluated for multiple types heart rhythms. For the first 24 hours post application, please avoid getting the Zio monitor wet in the shower or by excessive sweating during exercise. After that, feel free to carry on with regular activities. Keep soaps and lotions away from the ZIO XT Patch.  Someone will be calling you to let you know when this is mailed.  This will be mailed to you, please expect 7-10 days to receive.      Call [place] at [phone] if you have questions regarding your ZIO XT patch monitor.  Call them immediately if you see an orange light blinking on your monitor.   If your monitor falls off in less than 4 days contact our Monitor department at [phone].  If your monitor becomes loose or falls off after 4 days call [employer] at [phone] for suggestions on securing your monitor    Follow-Up: At [facility], you and your health needs are our priority.  As part of our continuing mission to provide you with exceptional heart care, we have created designated Provider Care Teams.  These Care Teams include your primary Cardiologist (physician) and Advanced Practice Providers (APPs -  Physician Assistants and Nurse Practitioners) who all work together to provide you with the care you need, when you need it.    Your next appointment:   7 TO 8 week(s)  The format for your next appointment:   In Person  Provider:   Glenetta Hew, MD     [place] Instructions   Your physician has requested you wear your ZIO patch monitor__7_____days.   This is a single patch monitor.  [employer] supplies one patch monitor per enrollment.  Additional stickers are not available.     Please do not apply patch if you will be having a Nuclear Stress Test, Echocardiogram, Cardiac CT, MRI, or Chest Xray during the time frame you would be wearing the monitor. The patch cannot be worn during these tests.  You cannot remove and re-apply the ZIO XT patch monitor.   Your ZIO patch monitor will be sent USPS Priority mail from [facility] directly to your home address. The monitor may also be mailed to a PO BOX if home delivery is not available.   It may take 3-5 days to receive your monitor after you have been enrolled.   Once you have received you monitor, please review enclosed instructions.  Your monitor has already been registered assigning a specific monitor serial # to you.   Applying the monitor   Shave hair from upper left chest.   Hold abrader disc by orange tab.  Rub abrader in 40 strokes over left upper chest as indicated in your monitor instructions.   Clean area with 4 enclosed alcohol pads .  Use all pads to assure are is cleaned thoroughly.  Let dry.   Apply patch as indicated in monitor instructions.  Patch will be place under collarbone on left side of chest with arrow pointing upward.   Rub patch adhesive wings for 2 minutes.Remove white label marked "1".  Remove white label marked "2".  Rub patch adhesive wings for 2 additional minutes.   While looking in a mirror, press and release button in center of  patch.  A small green light will flash 3-4 times .  This will be your only indicator the monitor has been turned on.     Do not shower for the first 24 hours.  You may shower after the first 24 hours.   Press button if you feel a symptom. You will hear a small click.  Record Date, Time and Symptom in the Patient Log Book.   When you are ready to remove patch, follow instructions on last 2 pages of Patient Log Book.  Stick patch monitor onto last page of Patient Log Book.   Place Patient Log Book in [place] box.  Use locking tab on box and tape box closed  securely.  The [employer] and [employer] has [employer] on it.  Please place in mailbox as soon as possible.  Your physician should have your test results approximately 7 days after the monitor has been mailed back to [facility].   Call [place] at [phone] if you have questions regarding your ZIO XT patch monitor.  Call them immediately if you see an orange light blinking on your monitor.   If your monitor falls off in less than 4 days contact our Monitor department at [phone].  If your monitor becomes loose or falls off after 4 days call [employer] at [phone] for suggestions on securing your monitor.

## 2020-01-04 NOTE — Progress Notes (Signed)
Primary Care Provider: Isaac Bliss, Rayford Halsted, MD Cardiologist: No primary care provider on file. Electrophysiologist: None  Pulmonologist: Dr. Lenna Gilford -> was seen at [facility], then did, now with Dr. Marshell Garfinkel with [facility] Note: Chief Complaint  Patient presents with  . 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  She is being seen today at the request of Isaac Bliss, [place]*.   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 Verl Blalock - Echo indicated normal EF and no evidence of pulmonary hypertension; Myoview was negative.  She was then seen by Dr. Jenkins Rouge in 2015 for chest tightness and dyspnea on minimal exertion (she indicated that her sats drop below 90 with exertion).  During that note he indicated that she has had chronic PVCs (per her report treated with magnesium). => Echo and Lexiscan Myoview ordered --> echo suggested GrDD; Myoview not done.  She was then seen by Dr. Nelda Marseille ([facility]) -> given Dx of HFpEF based upon 05/2013 Echo.  Was referred because of chest pain during Cardiac Rehabilitation.  At this time her exercise tolerance allowed her to walk to the kitchen with significant dyspnea.  That was her baseline.  She denies Zaroxolyn levels were dropping to the 70s when she exerts herself.  Unable to climb  stairs-takes too long to recover.  Simply walking up a couple of steps from the garage takes several minutes to recover. => Suggestion was that exertional dyspnea was clearly related to her pulmonary disease. TTE & Lexiscan Myoview ordered. - discussed ?RHC for invasive Pulm HTN measurement. => Neither test was done  [facility] - Dr. Kerin Ransom - was evaluated for Endobronchial Valve (Zephyr Valve) placement (discussed in June 2020) => recommendation was that she needed to improve her BMI from 41 down to 35 in order be considered a candidate for the procedure. => She attended 2 sessions of pulmonary rehab prior to Covid pandemic having.  Recent Hospitalizations: None  Her last clinic evaluation was with Dr. Jerilee Hoh Acosta-telemedicine visit.  She noted that physical therapist decided further home visits because of the patient being found to be bradycardic on several occasions and requested cardiology follow-up.  She was told her heart rate was low as 20 but the patient denied any lightheadedness dizziness syncope or near syncope.  Reviewed  CV studies:    The following studies were reviewed today: (if available, images/films reviewed: From Epic Chart or Care Everywhere) . Myoview April 2009: Large size capacity.  Mild chest pain/dyspnea.  Negative EKG.  Low risk adenosine nuclear study. . TTE 06/12/2013 [facility]): EF 60 to 65%.  Normal wall motion.  GR 1 DD.  No significant valvular abnormality.  No evidence to suggest pulmonary hypertension. . TTE 04/21/2014 [facility]): EF 55 to 60%.  Normal wall motion.  Normal RV size and function.  GR 1 DD.  CT chest ([facility]) 08/18/2018-severe centrilobular emphysema with bullous disease,   Interval History:   Rebecca [name] presents here today low bit unsure of why she is here.  She went in very long diatribe about her history: All the way back to the early 2000's.  She says she said several echocardiograms and several Myoview stress tests  relatively benign.  In the past she was treated with magnesium for PVCs.  She never been told that she is bradycardic.  She was little confused when the therapist decided to stop therapy. "Rebecca [name]" says that she feels a few PVCs every now and then.  She has not felt any lightheadedness or dizziness besides when she is dyspneic.   Because of her profound hypoxia and exercise intolerance due to oxygen desaturation, she really does not do much of anything.  She is always dyspneic, and wears oxygen.  As long as she is wearing oxygen at rest, she says her breathing is okay.  With any activity she Becomes very dyspneic and tachypneic.  She has trouble breathing through her nose and therefore the nasal cannula oxygen does not help.  What she notices over the last month or so is that something is just "different ".  She just has not had the desire of ability to do things that she used to try to do.  She does not notice any difficulty however with noticing her heart rate going up with activity.  While she notes that she is more tired than usual.  But this is because she is extremely sedentary.  This can occur very quickly and she desaturates well into the low 80s if she is not decreasing with exertion.  When her oxygen levels go down, she notes her heart rate goes up,.  When this happens, it sometimes exacerbates chest discomfort but mostly back pain tenseness of the neck and shoulders.  She says that she is always short of breath and has been stopped last 20 years.  She has noted that her heart rate goes up and down without any major difficulty.  Usually her heart rate is in the 90s just like her oxygen saturations, but can easily go down if she reduces her oxygen intake.  (Usually on 5 L).  She does carry diagnosis of OSA, has not been treated.  But however the identification of bradycardia was during daytime.  CV Review of Symptoms (Summary): positive for - dyspnea on exertion, edema, irregular heartbeat,  orthopnea, shortness of breath and Pretty stable venous stasis edema.  (Takes 80 mg Lasix daily).;  Easy fatigue, dizziness with loss of balance occasion. negative for - chest pain, rapid heart rate or No syncope or near syncope despite having fatigue and intermittent dizziness.  The patient does not have symptoms concerning for COVID-19 infection (fever, chills, cough, or new shortness of breath).   REVIEWED OF SYSTEMS   Review of Systems  Constitutional: Positive for diaphoresis (She does get sweaty and exhausted with minimal activity.) and malaise/fatigue. Negative for chills, fever and weight loss.  HENT: Positive for congestion. Negative for nosebleeds.   Respiratory: Positive for cough, shortness of breath and wheezing. Negative for sputum production.   Gastrointestinal: Negative for blood in stool and melena.  Genitourinary: Negative for hematuria.  Musculoskeletal: Positive for back pain, joint pain and myalgias. Negative for falls.       Mostly wheelchair  Neurological: Positive for dizziness, weakness (Generalized weakness.  Now essentially fully immobile  and wheelchair.) and headaches. Negative for focal weakness.  Psychiatric/Behavioral: Positive for depression. Negative for memory loss. The patient is nervous/anxious. The patient does not have insomnia.    I have reviewed and (if needed) personally updated the patient's problem list, medications, allergies, past medical and surgical history, social and family history.   PAST MEDICAL HISTORY   Past Medical History:  Diagnosis Date  . Chest pain   . Chronic venous insufficiency   . COPD (chronic obstructive pulmonary disease) ([place])   . DJD (degenerative joint disease)   . Dysthymia   . Fibromyalgia   . GERD (gastroesophageal reflux disease)   . History of headache   . Hodgkin lymphoma of extranodal or solid organ site [facility])   . Hypothyroid   . IBS (irritable bowel syndrome)   . Metabolic syndrome X   . Morbid obesity  ([place])   . OSA (obstructive sleep apnea)   . Vitamin D deficiency     Endometrial cancer Severe chronic hypoxic respiratory failure  PAST SURGICAL HISTORY   Past Surgical History:  Procedure Laterality Date  . APPENDECTOMY    . BILATERAL SALPINGOOPHORECTOMY  2007   [place]  . CHOLECYSTECTOMY  1982  . LAPAROSCOPIC GASTRIC BANDING  08/2007   Dr. Hassell Done  . LAPAROSCOPIC HYSTERECTOMY  2007   [facility]  . 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 PFIZER SARS-COV-2 Vaccination 08/07/2019    MEDICATIONS/ALLERGIES   Current Meds  Medication Sig  . albuterol (VENTOLIN HFA) 108 (90 Base) MCG/ACT inhaler INHALE 2 PUFFS INTO THE LUNGS EVERY 6 HOURS AS NEEDED FOR WHEEZING OR SHORTNESS OF BREATH  . ALPRAZolam (XANAX) 0.5 MG tablet TAKE ONE TABLET BY MOUTH THREE TIMES A DAY AS NEEDED FOR NERVES MAXIMUM DAILY DOSE OF THREE TABLETS  . Apple Cider Vinegar 500 MG TABS Take 1,000 mg by mouth daily.  . Ascorbic Acid (VITAMIN C) 1000 MG tablet Take 1,000 mg by mouth daily.  Marland Kitchen azithromycin (ZITHROMAX) 250 MG tablet TAKE ONE TABLET BY MOUTH DAILY  . baclofen (LIORESAL) 10 MG tablet Take 10 mg by mouth. Taking 1 nightly  . cetirizine (ZYRTEC) 10 MG tablet Take 10 mg by mouth daily.    . Cholecalciferol (VITAMIN D-3) 5000 UNITS TABS Take 1 tablet by mouth daily. Per pt medication list, takes 1,000  . CINNAMON PO Take 325 mg by mouth every morning. Takes 2 every am  . COLCRYS 0.6 MG tablet Take 1 tablet (0.6 mg total) by mouth 2 (two) times daily.  . cyanocobalamin 2000 MCG tablet Take 2,500 mcg by mouth 2 (two) times daily.  Marland Kitchen escitalopram (LEXAPRO) 10 MG tablet Take 1 tablet (10 mg total) by mouth daily.  . fluticasone (FLONASE) 50 MCG/ACT nasal spray Place 2 sprays into both nostrils daily as needed.   . Fluticasone-Umeclidin-Vilant (TRELEGY ELLIPTA) 100-62.5-25 MCG/INH AEPB Inhale 1 puff into the lungs daily.  . furosemide (LASIX) 40 MG  tablet TAKE 1 TO 2 TABLETS BY MOUTH DAILY AS NEEDED FOR SWELLING  . glucosamine-chondroitin 500-400 MG tablet Take 2 tablets by mouth 2 (two) times daily.   Marland Kitchen guaiFENesin (MUCINEX) 600 MG 12 hr tablet Take 400 mg by mouth 2 (two) times daily. Tales 3 tab;es twice a day  . HYDROcodone-acetaminophen (NORCO) 5-325 MG tablet Take 1 tablet by mouth every 6 (six) hours as needed for moderate pain.  Marland Kitchen ipratropium-albuterol (DUONEB) 0.5-2.5 (3) MG/3ML SOLN INHALE ONE VIAL VIA NEBULIZATION BY MOUTH  THREE TIMES A DAY  . ipratropium-albuterol (DUONEB) 0.5-2.5 (3) MG/3ML SOLN INHALE 1 VIAL VIA NEBULIZATION 3 TIMES DAILY  . Magnesium 400 MG CAPS Take 3 capsules by mouth 2 (two) times daily.   . Misc Natural Products (TART CHERRY ADVANCED PO) Take by mouth.  . OXYGEN Inhale 4 L into the lungs.  . predniSONE (DELTASONE) 20 MG tablet Take 2 tablets (40 mg total) by mouth daily with breakfast.  . sertraline (ZOLOFT) 50 MG tablet Take 1 tablet (50 mg total) by mouth daily.  . sodium chloride HYPERTONIC 3 % nebulizer solution Take by nebulization 2 (two) times daily.  Marland Kitchen Spacer/Aero-Holding Chambers (AEROCHAMBER MV) inhaler Use as instructed  . traMADol (ULTRAM) 50 MG tablet Take 1 tablet (50 mg total) by mouth 3 (three) times daily as needed for moderate pain.  . TURMERIC PO Take 500 mg by mouth 2 (two) times daily. Takes 1,000 mg  . vitamin A 10000 UNIT capsule Take 5,000 Units by mouth daily. Per pt list - takes 25,000    Allergies  Allergen Reactions  . Keflex [Cephalexin] Diarrhea  . Penicillins Nausea Only and Nausea And Vomiting    REACTION: nausea and vomiting REACTION: nausea and vomiting REACTION: nausea and vomiting    SOCIAL HISTORY/FAMILY HISTORY   Reviewed in Epic:  Pertinent findings:  Social History   Tobacco Use  . Smoking status: Former Smoker    Packs/day: 1.00    Years: 29.00    Pack years: 29.00    Types: Cigarettes    Start date: 03/02/1962    Quit date: 09/27/1991    Years  since quitting: 28.2  . Smokeless tobacco: Never Used  Vaping Use  . Vaping Use: Never used  Substance Use Topics  . Alcohol use: No    Alcohol/week: 0.0 standard drinks  . Drug use: No   Social History   Social History Narrative   Does not exercise   3 cups of coffee a day   Single- one child whom she gave up for adoption at birth => she then re-established contact with the daughter in 2009 --> new relationship with her & 2 grand daughters.   She lives with roommate.        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 She did this for approximately 7 to 8 years. 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   Exposures: No known exposures.  No mold, hot tub,   Smoking history: 30-pack-year smoker.  Quit in 1993   Travel history: No significant travel history   Family History  Problem Relation Age of Onset  . Heart failure Father   . Parkinson's disease Mother   . Endometrial cancer Mother   . Heart attack Sister   . Diabetes Mellitus II Brother     OBJCTIVE -PE, EKG, labs   Wt Readings from Last 3 Encounters:  01/04/20 254 lb 9.6 oz (115.5 kg)  07/04/19 246 lb 6.4 oz (111.8 kg)  01/20/19 242 lb 12.8 oz (110.1 kg)    Physical Exam: BP (!) 145/83   Pulse (!) 110   Temp (!) 96.6 F (35.9 C)   Ht 5\' 11"  (1.803 m)   Wt 254 lb 9.6 oz (115.5 kg)   SpO2 98%   BMI 35.51 kg/m  Physical Exam Vitals reviewed.  Constitutional:      General: She  is not in acute distress.    Appearance: She is obese. She is ill-appearing. She is not toxic-appearing or diaphoretic.     Comments: Mesomorphic obesity  HENT:     Head: Normocephalic and atraumatic.  Neck:     Vascular: No carotid bruit.     Comments: Difficult to assess JVD. Cardiovascular:     Rate and Rhythm: Regular rhythm. Tachycardia present. Occasional extrasystoles are present.     Chest Wall: PMI is displaced (Unable to palpate).     Pulses: Decreased pulses (Diminished because of edema and body habitus.  Puffy swelling).     Heart sounds: S1 normal and S2 normal. Heart sounds are distant. No murmur heard.  No friction rub. No gallop.   Musculoskeletal:     Cervical back: Normal range of motion and neck supple.  Neurological:     Mental Status: She is alert.     Adult ECG Report  Rate: 110 ;  Rhythm: normal sinus rhythm, premature atrial contractions (PAC) and Otherwise relatively reasonable axis and duration.;   Narrative Interpretation: Relatively normal EKG  Recent Labs:    Lab Results  Component Value Date   CHOL 168 05/05/2012   HDL 26.20 (L) 05/05/2012   LDLCALC 119 (H) 05/05/2012   TRIG 113.0 05/05/2012   CHOLHDL 6 05/05/2012   Lab Results  Component Value Date   CREATININE 1.40 (H) 03/29/2019   BUN 26 (H) 03/29/2019   NA 141 03/29/2019   K 4.6 03/29/2019   CL 103 03/29/2019   CO2 29 03/29/2019   Lab Results  Component Value Date   TSH 1.24 03/16/2017    ASSESSMENT/PLAN   Rebecca [name] was referred bradycardia seen during rehab.  She is chronically dyspneic and has been evaluated by at least 3 or 4 different cardiologist in the past all of them of considered her dyspnea to be pulmonary in nature. She is not on any rate control agents.  She is on high-dose statin Lasix without any other afterload reduction.  I do not think grade 1 diastolic function explains any dyspnea.  Noted to explain any edema.  She, in as much indicated that she would not be interested in a stress test.  She is not having chest pain or pressure  At this point I think the most prudent course of action is to have her wear a 7-day Zio patch monitor. She is not on any medications that would cause bradycardia.  She also does not have any chronotropic incompetence based on her heart rate at rest.  Unless there is severe pauses evidence of heart block that would  potentially lead to consideration of pacemaker, bradycardia during sleep or rest is likely related respiratory issues.  She has sharp twinging chest discomfort, and occasional tightness with dyspnea that does not sound anginal in nature.  If this were to worsen, we could consider coronary CTA which would be difficult given her body habitus.  But this would be a more effective way to evaluate for coronary disease.  In the past, there was discussion possible right heart catheterization to evaluate pulmonary hypertension with her severe lung disease, but that never panned out.  Most recent echo 2016 did not suggest any evidence of pulmonary hypertension.   I will see her back after completion of the monitor.  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  This is probably was causing edema and dyspnea  more so on the left side failure.  OHS is likely.      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  One consideration for possible bradycardia would be hypoxia.  She is on standing oxygen.  Plan: Follow-up Zio patch monitor for signs of hypoxia mediated bradycardia.      Diastolic dysfunction (Chronic)   Relevant Orders   EKG 12-Lead (Completed)   Venous (peripheral) insufficiency    Chronic mild lower extremity edema.  She takes high-dose Lasix.  Not taking any additional besides the 80 mg.  We talked about foot elevation and support socks.      Chest pain, atypical - Primary    Symptoms are very atypical and unlikely.  These are chronic and not new.  If symptoms worsen, consider ischemic evaluation coronary CTA      Relevant Orders   EKG 12-Lead (Completed)   Bradycardia with 31-40 beats per minute   Relevant Orders   EKG 12-Lead (Completed)   LONG TERM MONITOR (3-14 DAYS)      COVID-19 Education: The signs and symptoms of COVID-19 were discussed with the patient and how to seek care for  testing (follow up with PCP or arrange E-visit).   The importance of social distancing and COVID-19 vaccination was discussed today.  The patient is practicing social distancing & Masking.   I spent a total of 55minutes with the patient spent in direct patient consultation.  Additional time spent with chart review  / charting (studies, outside notes, etc): 30 Total Time: 62 min   Current medicines are reviewed at length with the patient today.  (+/- concerns) n/a  This visit occurred during the SARS-CoV-2 public health emergency.  Safety protocols were in place, including screening questions prior to the visit, additional usage of staff PPE, and extensive cleaning of exam room while observing appropriate contact time as indicated for disinfecting solutions.  Notice: This dictation was prepared with Dragon dictation along with smaller phrase technology. Any transcriptional errors that result from this process are unintentional and may not be corrected upon review.  Patient Instructions / Medication Changes & Studies & Tests Ordered   Patient Instructions  Medication Instructions:  NO CHANGES  Lab Work: NOT NEEDED   Testing/Procedures: Your physician has recommended that you wear a 7  DAY ZIO-PATCH monitor. The Zio patch cardiac monitor continuously records heart rhythm data, this is for patients being evaluated for multiple types heart rhythms. For the first 24 hours post application, please avoid getting the Zio monitor wet in the shower or by excessive sweating during exercise. After that, feel free to carry on with regular activities. Keep soaps and lotions away from the ZIO XT Patch.  Someone will be calling you to let you know when this is mailed.  This will be mailed to you, please expect 7-10 days to receive.      Call [place] at [phone] if you have questions regarding your ZIO XT patch monitor.  Call them immediately if you see an orange light  blinking on your monitor.   If your monitor falls off in less than 4 days contact our Monitor department at [phone].  If your monitor becomes loose or falls off after 4 days call [employer] at [phone] for suggestions on securing your monitor    Follow-Up: At [facility], you and your health needs are our priority.  As part of our continuing mission to provide you with exceptional heart care, we have  created designated Provider Care Teams.  These Care Teams include your primary Cardiologist (physician) and Advanced Practice Providers (APPs -  Physician Assistants and Nurse Practitioners) who all work together to provide you with the care you need, when you need it.    Your next appointment:   7 TO 8 week(s)  The format for your next appointment:   In Person  Provider:   Glenetta Hew, MD     [place] Instructions   Your physician has requested you wear your ZIO patch monitor__7_____days.   This is a single patch monitor.  [employer] supplies one patch monitor per enrollment.  Additional stickers are not available.   Please do not apply patch if you will be having a Nuclear Stress Test, Echocardiogram, Cardiac CT, MRI, or Chest Xray during the time frame you would be wearing the monitor. The patch cannot be worn during these tests.  You cannot remove and re-apply the ZIO XT patch monitor.   Your ZIO patch monitor will be sent USPS Priority mail from [facility] directly to your home address. The monitor may also be mailed to a PO BOX if home delivery is not available.   It may take 3-5 days to receive your monitor after you have been enrolled.   Once you have received you monitor, please review enclosed instructions.  Your monitor has already been registered assigning a specific monitor serial # to you.   Applying the monitor   Shave hair from upper left chest.   Hold abrader disc by orange tab.  Rub abrader in 40 strokes over left upper chest  as indicated in your monitor instructions.   Clean area with 4 enclosed alcohol pads .  Use all pads to assure are is cleaned thoroughly.  Let dry.   Apply patch as indicated in monitor instructions.  Patch will be place under collarbone on left side of chest with arrow pointing upward.   Rub patch adhesive wings for 2 minutes.Remove white label marked "1".  Remove white label marked "2".  Rub patch adhesive wings for 2 additional minutes.   While looking in a mirror, press and release button in center of patch.  A small green light will flash 3-4 times .  This will be your only indicator the monitor has been turned on.     Do not shower for the first 24 hours.  You may shower after the first 24 hours.   Press button if you feel a symptom. You will hear a small click.  Record Date, Time and Symptom in the Patient Log Book.   When you are ready to remove patch, follow instructions on last 2 pages of Patient Log Book.  Stick patch monitor onto last page of Patient Log Book.   Place Patient Log Book in [place] box.  Use locking tab on box and tape box closed securely.  The [employer] and [employer] has [employer] on it.  Please place in mailbox as soon as possible.  Your physician should have your test results approximately 7 days after the monitor has been mailed back to [facility].   Call [place] at [phone] if you have questions regarding your ZIO XT patch monitor.  Call them immediately if you see an orange light blinking on your monitor.   If your monitor falls off in less than 4 days contact our Monitor department at [phone].  If your monitor becomes loose or falls off after 4 days call  [employer] at [phone] for suggestions on securing your monitor.       Studies Ordered:   Orders Placed This Encounter  Procedures  . LONG TERM MONITOR (3-14 DAYS)  . EKG 12-Lead     Glenetta Hew, M.D., M.S. Interventional Cardiologist   Pager #  [phone] Phone # [phone] [address] [place], [place] [address]   Thank you for choosing Heartcare at [facility]!!

## 2020-01-06 ENCOUNTER — Encounter: Payer: Self-pay | Admitting: Cardiology

## 2020-01-06 NOTE — Assessment & Plan Note (Signed)
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  This is probably was causing edema and dyspnea more so on the left side failure.  OHS is likely.

## 2020-01-06 NOTE — Assessment & Plan Note (Signed)
Symptoms are very atypical and unlikely.  These are chronic and not new.  If symptoms worsen, consider ischemic evaluation coronary CTA

## 2020-01-06 NOTE — Assessment & Plan Note (Signed)
Chronic mild lower extremity edema.  She takes high-dose Lasix.  Not taking any additional besides the 80 mg.  We talked about foot elevation and support socks.

## 2020-01-06 NOTE — Assessment & Plan Note (Addendum)
Severe lung disease.  One consideration for possible bradycardia would be hypoxia.  She is on standing oxygen.  Plan: Follow-up Zio patch monitor for signs of hypoxia mediated bradycardia.

## 2020-01-09 ENCOUNTER — Telehealth: Payer: Self-pay | Admitting: Pulmonary Disease

## 2020-01-09 DIAGNOSIS — J449 Chronic obstructive pulmonary disease, unspecified: Secondary | ICD-10-CM

## 2020-01-09 DIAGNOSIS — J9610 Chronic respiratory failure, unspecified whether with hypoxia or hypercapnia: Secondary | ICD-10-CM

## 2020-01-09 NOTE — Telephone Encounter (Signed)
LMTCB

## 2020-01-09 NOTE — Telephone Encounter (Signed)
Called and spoke with the patient regarding portable tanks.  She has a concentrator that goes to 5L, but is afraid to use it in her home for fear of falling over the tubing.  She wants to have 28 E-tanks per month to fill as she has an upfill.  She states she uses about 1 E-tank per day on 5L of oxygen.  She is having difficulty getting what she needs from [facility] and is asking for assistance from our office.  Dr. Gemma Payor, Please advise if ok to send an order to [employer] for 28 E-tanks per month.  Thank you.

## 2020-01-09 NOTE — Telephone Encounter (Signed)
Patient is returning phone call. Patient phone number is [phone].

## 2020-01-10 ENCOUNTER — Ambulatory Visit: Payer: Medicare HMO | Admitting: Primary Care

## 2020-01-10 NOTE — Telephone Encounter (Signed)
Order has been placed. Called and spoke with pt letting her know that order had been placed and she verbalized understanding. Nothing further needed.

## 2020-01-10 NOTE — Telephone Encounter (Signed)
Ok to send in the order to DME. Thanks

## 2020-01-16 ENCOUNTER — Other Ambulatory Visit: Payer: Self-pay | Admitting: Internal Medicine

## 2020-01-16 DIAGNOSIS — J9610 Chronic respiratory failure, unspecified whether with hypoxia or hypercapnia: Secondary | ICD-10-CM

## 2020-01-17 ENCOUNTER — Telehealth: Payer: Self-pay | Admitting: Internal Medicine

## 2020-01-17 NOTE — Telephone Encounter (Signed)
I think ok to go, wear face shield if possible. If indoors, try to social distance, frequent hand washing.

## 2020-01-17 NOTE — Telephone Encounter (Signed)
Patient is calling and state that [facility] and can't tolerate a mask because of her condition but wanted to see if Dr. Jerilee Hoh recommended her to go or stay home, please advise. CB is [phone].

## 2020-01-18 NOTE — Telephone Encounter (Signed)
Patient is aware 

## 2020-01-30 ENCOUNTER — Other Ambulatory Visit: Payer: Self-pay | Admitting: Pulmonary Disease

## 2020-01-30 DIAGNOSIS — J449 Chronic obstructive pulmonary disease, unspecified: Secondary | ICD-10-CM

## 2020-02-08 ENCOUNTER — Other Ambulatory Visit: Payer: Self-pay | Admitting: *Deleted

## 2020-02-08 DIAGNOSIS — F419 Anxiety disorder, unspecified: Secondary | ICD-10-CM

## 2020-02-08 MED ORDER — ALPRAZOLAM 0.5 MG PO TABS: ORAL_TABLET | ORAL | 0 refills | Status: DC

## 2020-02-09 ENCOUNTER — Telehealth: Payer: Self-pay | Admitting: Internal Medicine

## 2020-02-09 NOTE — Telephone Encounter (Signed)
Patient was transferred to triage on 02/08/20 at 1:58pfor chief complaint: shortness of breath. Pt states SOB has gotten worse over the last 2 or 3 months. Currently under [place] and pt states the company not providing enough oxygen. Triage Advised to go to ED

## 2020-02-09 NOTE — Telephone Encounter (Signed)
FYI

## 2020-02-12 ENCOUNTER — Telehealth: Payer: Self-pay | Admitting: Pulmonary Disease

## 2020-02-12 DIAGNOSIS — J9610 Chronic respiratory failure, unspecified whether with hypoxia or hypercapnia: Secondary | ICD-10-CM

## 2020-02-12 MED ORDER — PREDNISONE 20 MG PO TABS: 40.0000 mg | ORAL_TABLET | Freq: Every day | ORAL | 0 refills | Status: DC

## 2020-02-12 NOTE — Telephone Encounter (Signed)
Spoke with the pt and notified of DME response  I have scheduled her to see TP and qualify for o2 at appt 03/04/20  She wants to keep planned appt with Dr. Vaughan Browner for 03/19/20  Nothing further needed

## 2020-02-12 NOTE — Telephone Encounter (Addendum)
Order was put in for pt to switch from [facility] to [facility] for O2.  I called [employer] and spoke to [place].  She states pt will need ov and will have to have qualifying walk to switch dme.  I closed the order that was placed due to can't send to [place].

## 2020-02-12 NOTE — Telephone Encounter (Signed)
Spoke with pt, c/o chest congestion, prod cough with thick white/yellow mucus, sob at rest and with exertion, fatigue.  Denies fever, hemoptysis, nausea/vomiting.  Pt takes daily azithromycin (maintenance).   Pt also states that her home fill system is not sufficient- states she only has D tanks at home with her home fill system, is unable to leave the home since she wears up to 5lpm and a D tank lasts less than 1 hour for her.  We had ordered  E tanks to [employer] on 11/10 but pt has never been contacted about this.    Pt also wants to know if she should get her covid booster vaccine, and which brand of vaccine she should get.  Pt received pfizer for 1st and 2nd shot, last shot was given the beginning of July.  Pt asked where she can get this- I advised her that most pharmacies offer this and she should be able to contact her local pharmacy to get this.    Pt requesting prednisone, a new DME company to provide her with E tanks, and recs on her covid vaccine.   [facility]armacy: [facility] in [place]   Dr. Vaughan Browner please advise.  Thanks!

## 2020-02-12 NOTE — Telephone Encounter (Signed)
Send in prednisone 40 mg/day for 5 days  Ok to switch DME companies.  Yes I recommend her to get the Covid booster with [employer].  She will be eligible for it 6 months after her last dose.

## 2020-02-12 NOTE — Telephone Encounter (Signed)
Spoke with the pt and notified of response per Dr Vaughan Browner. Pt verbalized understanding. Rx sent to pharm for pred and order to DME. I have scheduled her a f/u for 03/19/20.

## 2020-02-16 ENCOUNTER — Telehealth: Payer: Self-pay | Admitting: Pulmonary Disease

## 2020-02-16 MED ORDER — PREDNISONE 10 MG PO TABS: ORAL_TABLET | ORAL | 0 refills | Status: AC

## 2020-02-16 NOTE — Telephone Encounter (Signed)
Give another round of prednisone at 50 mg daily for 5 days. I think she will do well with daily chronic prednisone as she has frequent exacerbations of severe COPD We can put her on 10 mg daily after she finishes these 5 days

## 2020-02-16 NOTE — Telephone Encounter (Signed)
Called and spoke with pt letting her know the info stated by Dr. Vaughan Browner and she verbalized understanding. Rx for prednisone has been sent to preferred pharmacy for pt. Pt has not been seen in office since 08/28/19 so no refills were added. We can add additional refills when tp comes to office 03/04/20. Nothing further needed.

## 2020-02-16 NOTE — Telephone Encounter (Signed)
Spoke with the pt  She states that she finished her pred 40 mg daily today  This helped some but still feels that her breathing is more labored than usual  She states her cough has improved some, but is still coughing up minimal yellow to white sputum  She states no wheezing and not having any f/c/s, body aches  Still on daily azithromycin Next ov 03/04/2020 Please advise, thanks!

## 2020-03-04 ENCOUNTER — Ambulatory Visit: Payer: Medicare HMO | Admitting: Adult Health

## 2020-03-11 ENCOUNTER — Other Ambulatory Visit: Payer: Self-pay

## 2020-03-11 ENCOUNTER — Ambulatory Visit (INDEPENDENT_AMBULATORY_CARE_PROVIDER_SITE_OTHER): Payer: Medicare HMO | Admitting: Adult Health

## 2020-03-11 ENCOUNTER — Encounter: Payer: Self-pay | Admitting: Adult Health

## 2020-03-11 DIAGNOSIS — J9611 Chronic respiratory failure with hypoxia: Secondary | ICD-10-CM

## 2020-03-11 DIAGNOSIS — J449 Chronic obstructive pulmonary disease, unspecified: Secondary | ICD-10-CM

## 2020-03-11 MED ORDER — PREDNISONE 5 MG PO TABS: 5.0000 mg | ORAL_TABLET | Freq: Every day | ORAL | 0 refills | Status: DC

## 2020-03-11 NOTE — Assessment & Plan Note (Signed)
Requires O2 3l/m rest and 5l/m with activity  Change DME per request  O2 qualification completed   Plan  Patient Instructions  Continue on Trelegy 1 puff daily, rinse after use Continue azithromycin daily Decrease Prednisone 5mg  daily .  Albuterol as needed Continue on flutter valve and hypertonic nebs Activity as tolerated Continue on oxygen 3 l/m rest  and 5l/m with activity  Covid booster as discussed.  Follow-up with Dr. Vaughan Browner as planned in 2 weeks and As needed   Please contact office for sooner follow up if symptoms do not improve or worsen or seek emergency care

## 2020-03-11 NOTE — Patient Instructions (Addendum)
Continue on Trelegy 1 puff daily, rinse after use Continue azithromycin daily Decrease Prednisone 5mg  daily .  Albuterol as needed Continue on flutter valve and hypertonic nebs Activity as tolerated Continue on oxygen 3 l/m rest  and 5l/m with activity  Covid booster as discussed.  Follow-up with Dr. Vaughan Browner as planned in 2 weeks and As needed   Please contact office for sooner follow up if symptoms do not improve or worsen or seek emergency care

## 2020-03-11 NOTE — Assessment & Plan Note (Signed)
+  recent exacerbation now improved .  Will decrease steroids, on return consider d/c  Plan  Patient Instructions  Continue on Trelegy 1 puff daily, rinse after use Continue azithromycin daily Decrease Prednisone 5mg  daily .  Albuterol as needed Continue on flutter valve and hypertonic nebs Activity as tolerated Continue on oxygen 3 l/m rest  and 5l/m with activity  Covid booster as discussed.  Follow-up with Dr. Vaughan Browner as planned in 2 weeks and As needed   Please contact office for sooner follow up if symptoms do not improve or worsen or seek emergency care    '

## 2020-03-11 NOTE — Progress Notes (Signed)
@Patient  ID: Rebecca [name], female    DOB: 22-Sep-1950, 70 y.o.   MRN: [id number]  Chief Complaint  Patient presents with  . 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 She did this for approximately 7 to 8 years. 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 Exposures: No known exposures.  No mold, hot tub, Smoking history: 30-pack-year smoker.  Quit in 1993 Travel history: No significant travel history Relevant family history: No significant family history of lung disease  Chest x-ray 04/16/18- Emphysematous changes in the upper lobes with mild atelectasis in the right base.  CT chest ([facility]) 08/18/2018-severe centrilobular emphysema with bullous disease, possible dilatation of left renal collecting system > pt has already seen a urologist at [facility] for this  Chest x-ray 08/24/2019-emphysematous changes.  No acute abnormality.  PFTs: 06/05/2014 FVC 1.6 (50%), FEV1 0.9 [35%],/55 Severe airway obstruction  03/11/2020 Follow up : COPD and O2 RF  Patient returns for follow-up visit.  Patient has underlying severe COPD that is oxygen dependent.  She remains on Trelegy inhaler.  She says overall breathing is doing about the same. Gets winded with minimal activity . Very sedentary. Had flare of breathing last month , called in prednisone burst and kept on prednisone 10mg  daily . Feels she is back to baseline. She is also on chronic azithromycin daily.  She uses flutter valve and hypertonic saline for mucociliary clearance.  Patient is on oxygen 2 L at rest and bedtime .Requires 5l/m to walk. .  Patient's insurance is requiring a walk test today. Needs to change DME companies due to oxygen  requirements . Wants to change to [employer].  Today oxygen level on room air 85% on room air , O2 sats on 3 /m >88-90%. At rest .  Requires 5l/m walking per patient on O2 .  Has trouble walking any amount of distance.  Has motorized chair now allows her to be more mobile.  Gets frustrated by her limitations.    Allergies  Allergen Reactions  . Keflex [Cephalexin] Diarrhea  . Penicillins Nausea Only and Nausea And Vomiting    REACTION: nausea and vomiting REACTION: nausea and vomiting REACTION: nausea and vomiting    Immunization History  Administered Date(s) Administered  . PFIZER SARS-COV-2 Vaccination 08/07/2019    Past Medical History:  Diagnosis Date  . Chest pain   . Chronic venous insufficiency   . COPD (chronic obstructive pulmonary disease) ([place])   . DJD (degenerative joint disease)   . Dysthymia   . Fibromyalgia   . GERD (gastroesophageal reflux disease)   . History of headache   . Hodgkin lymphoma of extranodal or solid organ site [facility])   . Hypothyroid   . IBS (irritable bowel syndrome)   . Metabolic syndrome X   . Morbid obesity ([place])   . OSA (obstructive sleep apnea)   . Vitamin D deficiency     Tobacco History: Social History   Tobacco Use  Smoking Status Former Smoker  . Packs/day: 1.00  . Years: 29.00  . Pack years: 29.00  . Types: Cigarettes  . Start date: 03/02/1962  . Quit date: 09/27/1991  . Years since quitting: 28.4  Smokeless Tobacco Never Used   Counseling given:  Not Answered   Outpatient Medications Prior to Visit  Medication Sig Dispense Refill  . albuterol (VENTOLIN HFA) 108 (90 Base) MCG/ACT inhaler INHALE 2 PUFFS INTO THE LUNGS EVERY 6 HOURS AS NEEDED FOR WHEEZING OR SHORTNESS OF BREATH 18 g 11  . ALPRAZolam (XANAX) 0.5 MG tablet TAKE ONE TABLET BY MOUTH THREE TIMES A DAY AS NEEDED FOR NERVES MAXIMUM DAILY DOSE OF THREE TABLETS 90 tablet 0  . Apple Cider Vinegar 500 MG TABS Take 1,000 mg by mouth daily.    . Ascorbic Acid (VITAMIN  C) 1000 MG tablet Take 1,000 mg by mouth daily.    Marland Kitchen azithromycin (ZITHROMAX) 250 MG tablet TAKE ONE TABLET BY MOUTH DAILY 30 tablet 3  . baclofen (LIORESAL) 10 MG tablet Take 10 mg by mouth. Taking 1 nightly    . cetirizine (ZYRTEC) 10 MG tablet Take 10 mg by mouth daily.    . Cholecalciferol (VITAMIN D-3) 5000 UNITS TABS Take 1 tablet by mouth daily. Per pt medication list, takes 1,000    . CINNAMON PO Take 325 mg by mouth every morning. Takes 2 every am    . COLCRYS 0.6 MG tablet Take 1 tablet (0.6 mg total) by mouth 2 (two) times daily. 60 tablet 2  . cyanocobalamin 2000 MCG tablet Take 2,500 mcg by mouth 2 (two) times daily.    Marland Kitchen escitalopram (LEXAPRO) 10 MG tablet Take 1 tablet (10 mg total) by mouth daily. 90 tablet 1  . fluticasone (FLONASE) 50 MCG/ACT nasal spray Place 2 sprays into both nostrils daily as needed.     . furosemide (LASIX) 40 MG tablet TAKE 1 TO 2 TABLETS BY MOUTH DAILY AS NEEDED FOR SWELLING 180 tablet 0  . glucosamine-chondroitin 500-400 MG tablet Take 2 tablets by mouth 2 (two) times daily.     Marland Kitchen guaiFENesin (MUCINEX) 600 MG 12 hr tablet Take 400 mg by mouth 2 (two) times daily. Tales 3 tab;es twice a day    . HYDROcodone-acetaminophen (NORCO) 5-325 MG tablet Take 1 tablet by mouth every 6 (six) hours as needed for moderate pain. 20 tablet 0  . ipratropium-albuterol (DUONEB) 0.5-2.5 (3) MG/3ML SOLN INHALE ONE VIAL VIA NEBULIZATION BY MOUTH THREE TIMES A DAY 3 mL 2  . Magnesium 400 MG CAPS Take 3 capsules by mouth 2 (two) times daily.     . Misc Natural Products (TART CHERRY ADVANCED PO) Take by mouth.    . OXYGEN Inhale 4 L into the lungs.    . sertraline (ZOLOFT) 50 MG tablet Take 1 tablet (50 mg total) by mouth daily. 90 tablet 1  . sodium chloride HYPERTONIC 3 % nebulizer solution Take by nebulization 2 (two) times daily. 750 mL 12  . Spacer/Aero-Holding Chambers (AEROCHAMBER MV) inhaler Use as instructed 1 each 0  . traMADol (ULTRAM) 50 MG tablet Take 1 tablet (50  mg total) by mouth 3 (three) times daily as needed for moderate pain. 90 tablet 3  . TRELEGY ELLIPTA 100-62.5-25 MCG/INH AEPB INHALE ONE PUFF BY MOUTH DAILY 60 each 3  . TURMERIC PO Take 500 mg by mouth 2 (two) times daily. Takes 1,000 mg    . UNABLE TO FIND Med Name: [place] OTC    . vitamin A 10000 UNIT capsule Take 5,000 Units by mouth daily. Per pt list - takes 25,000    . ipratropium-albuterol (DUONEB) 0.5-2.5 (3) MG/3ML SOLN INHALE 1 VIAL VIA NEBULIZATION 3 TIMES DAILY 270 mL 11  . loperamide (IMODIUM) 2 MG capsule Take 1 capsule (  2 mg total) by mouth 4 (four) times daily as needed for diarrhea or loose stools. 12 capsule 0  . ondansetron (ZOFRAN ODT) 4 MG disintegrating tablet Take 1 tablet (4 mg total) by mouth every 8 (eight) hours as needed for nausea or vomiting. 20 tablet 0   No facility-administered medications prior to visit.     Review of Systems:   Constitutional:   No  weight loss, night sweats,  Fevers, chills, + fatigue, or  lassitude.  HEENT:   No headaches,  Difficulty swallowing,  Tooth/dental problems, or  Sore throat,                No sneezing, itching, ear ache, nasal congestion, post nasal drip,   CV:  No chest pain,  Orthopnea, PND, swelling in lower extremities, anasarca, dizziness, palpitations, syncope.   GI  No heartburn, indigestion, abdominal pain, nausea, vomiting, diarrhea, change in bowel habits, loss of appetite, bloody stools.   Resp: .  No chest wall deformity  Skin: no rash or lesions.  GU: no dysuria, change in color of urine, no urgency or frequency.  No flank pain, no hematuria   MS:  No joint pain or swelling.  No decreased range of motion.  No back pain.    Physical Exam  BP 124/72 (BP Location: Left Arm, Cuff Size: Normal)   Pulse (!) 108   Temp (!) 97.5 F (36.4 C) (Temporal)   SpO2 96% Comment: 3lpm continuous  GEN: A/Ox3; pleasant , NAD, well nourished , motorized chair    HEENT:  [place]/AT,    NOSE-clear, THROAT-clear, no  lesions, no postnasal drip or exudate noted.   NECK:  Supple w/ fair ROM; no JVD; normal carotid impulses w/o bruits; no thyromegaly or nodules palpated; no lymphadenopathy.    RESP  Clear  P & A; w/o, wheezes/ rales/ or rhonchi. no accessory muscle use, no dullness to percussion  CARD:  RRR, no m/r/g, no  peripheral edema, pulses intact, no cyanosis or clubbing.  GI:   Soft & nt; nml bowel sounds; no organomegaly or masses detected.   Musco: Warm bil, no deformities or joint swelling noted.   Neuro: alert, no focal deficits noted.    Skin: Warm, no lesions or rashes    Lab Results:   BNP No results found for: BNP  ProBNP  Imaging: No results found.    No flowsheet data found.  No results found for: NITRICOXIDE      Assessment & Plan:   COPD mixed type ([place]) +recent exacerbation now improved .  Will decrease steroids, on return consider d/c  Plan  Patient Instructions  Continue on Trelegy 1 puff daily, rinse after use Continue azithromycin daily Decrease Prednisone 5mg  daily .  Albuterol as needed Continue on flutter valve and hypertonic nebs Activity as tolerated Continue on oxygen 3 l/m rest  and 5l/m with activity  Covid booster as discussed.  Follow-up with Dr. Vaughan Browner as planned in 2 weeks and As needed   Please contact office for sooner follow up if symptoms do not improve or worsen or seek emergency care    '   Chronic respiratory failure ([place]) Requires O2 3l/m rest and 5l/m with activity  Change DME per request  O2 qualification completed   Plan  Patient Instructions  Continue on Trelegy 1 puff daily, rinse after use Continue azithromycin daily Decrease Prednisone 5mg  daily .  Albuterol as needed Continue on flutter valve and hypertonic nebs Activity as tolerated Continue on  oxygen 3 l/m rest  and 5l/m with activity  Covid booster as discussed.  Follow-up with Dr. Vaughan Browner as planned in 2 weeks and As needed   Please contact office for  sooner follow up if symptoms do not improve or worsen or seek emergency care       Morbid obesity ([place]) Healthy weight loss       Rexene Edison, NP 03/11/2020

## 2020-03-11 NOTE — Assessment & Plan Note (Signed)
Healthy weight loss 

## 2020-03-12 ENCOUNTER — Ambulatory Visit: Payer: Medicare HMO | Admitting: Cardiology

## 2020-03-14 DIAGNOSIS — J9611 Chronic respiratory failure with hypoxia: Secondary | ICD-10-CM

## 2020-03-14 MED ORDER — PREDNISONE 10 MG PO TABS: 10.0000 mg | ORAL_TABLET | Freq: Every day | ORAL | 2 refills | Status: DC

## 2020-03-14 NOTE — Telephone Encounter (Signed)
Primary Pulmonologist: Mann[place] Last office visit and with whom: 03/11/2020  Rexene Edison NP What do we see them for (pulmonary problems): COPD Last OV assessment/plan: Assessment & Plan Note by Melvenia Needles, NP at 03/11/2020 4:10 PM  Author: Melvenia Needles, NP Author Type: Nurse Practitioner Filed: 03/11/2020 4:10 PM  Note Status: Written Cosign: Cosign Not Required Encounter Date: 03/11/2020  Problem: Morbid obesity ([place])  Editor: Parrett, Fonnie Mu, NP (Nurse Practitioner)               Healthy weight loss         Assessment & Plan Note by Melvenia Needles, NP at 03/11/2020 4:09 PM  Author: Melvenia Needles, NP Author Type: Nurse Practitioner Filed: 03/11/2020 4:09 PM  Note Status: Written Cosign: Cosign Not Required Encounter Date: 03/11/2020  Problem: Chronic respiratory failure ([place])  Editor: Melvenia Needles, NP (Nurse Practitioner)               Requires O2 3l/m rest and 5l/m with activity  Change DME per request  O2 qualification completed   Plan  Patient Instructions  Continue on Trelegy 1 puff daily, rinse after use Continue azithromycin daily Decrease Prednisone 5mg  daily .  Albuterol as needed Continue on flutter valve and hypertonic nebs Activity as tolerated Continue on oxygen 3 l/m rest  and 5l/m with activity  Covid booster as discussed.  Follow-up with Dr. Vaughan Browner as planned in 2 weeks and As needed   Please contact office for sooner follow up if symptoms do not improve or worsen or seek emergency care            Assessment & Plan Note by Melvenia Needles, NP at 03/11/2020 4:08 PM  Author: Melvenia Needles, NP Author Type: Nurse Practitioner Filed: 03/11/2020 4:09 PM  Note Status: Written Cosign: Cosign Not Required Encounter Date: 03/11/2020  Problem: COPD mixed type [facility])  Editor: Melvenia Needles, NP (Nurse Practitioner)               +recent exacerbation now improved .  Will decrease steroids, on return consider d/c  Plan  Patient  Instructions  Continue on Trelegy 1 puff daily, rinse after use Continue azithromycin daily Decrease Prednisone 5mg  daily .  Albuterol as needed Continue on flutter valve and hypertonic nebs Activity as tolerated Continue on oxygen 3 l/m rest  and 5l/m with activity  Covid booster as discussed.  Follow-up with Dr. Vaughan Browner as planned in 2 weeks and As needed   Please contact office for sooner follow up if symptoms do not improve or worsen or seek emergency care    '        Patient Instructions by Melvenia Needles, NP at 03/11/2020 3:30 PM  Author: Melvenia Needles, NP Author Type: Nurse Practitioner Filed: 03/11/2020 4:08 PM  Note Status: Addendum Cosign: Cosign Not Required Encounter Date: 03/11/2020  Editor: Melvenia Needles, NP (Nurse Practitioner)      Prior Versions: 1. Parrett, Fonnie Mu, NP (Nurse Practitioner) at 03/11/2020 3:54 PM - Addendum   2. Parrett, Fonnie Mu, NP (Nurse Practitioner) at 03/11/2020 3:42 PM - Addendum   3. Parrett, Fonnie Mu, NP (Nurse Practitioner) at 03/11/2020 3:35 PM - Addendum   4. Parrett, Fonnie Mu, NP (Nurse Practitioner) at 03/11/2020 3:21 PM - Signed    Continue on Trelegy 1 puff daily, rinse after use Continue azithromycin daily Decrease Prednisone 5mg  daily .  Albuterol as needed Continue on flutter valve and hypertonic nebs  Activity as tolerated Continue on oxygen 3 l/m rest  and 5l/m with activity  Covid booster as discussed.  Follow-up with Dr. Vaughan Browner as planned in 2 weeks and As needed   Please contact office for sooner follow up if symptoms do not improve or worsen or seek emergency care          Instructions     Return in about 2 weeks (around 03/25/2020).  Continue on Trelegy 1 puff daily, rinse after use Continue azithromycin daily Decrease Prednisone 5mg  daily .  Albuterol as needed Continue on flutter valve and hypertonic nebs Activity as tolerated Continue on oxygen 3 l/m rest  and 5l/m with activity  Covid booster as  discussed.  Follow-up with Dr. Vaughan Browner as planned in 2 weeks and As needed   Please contact office for sooner follow up if symptoms do not improve or worsen or seek emergency care       Reason for call: Prednisone lowered to 5 mg until sees Dr. Vaughan Browner on 03/19/20. Cough started yesterday dry and scratchy, cough has become a wet cough.  The cough is non productive.   Sats are dropping Sitting sats are 90% on RA.  She is using the hypertonic nebulizer solution, can only get about half of it in and it make her more sob.  She states that her sats on 5L was 73%, but she determined that her regulator was not working correctly (the oxygen was not getting to her as the hole was not completely open), advised she call the DME company, however, she stated she is changing to [place] because of issues with [employer], so she is just going to [place] with it to make sure it is open from now on.  She feel like her breathing is worse since decreasing her prednisone to 5mg .  She is using her Albuterol inhaler for sob.  Tammy, please advise.  Thank you.  (examples of things to ask: : When did symptoms start? Fever? Cough? Productive? Color to sputum? More sputum than usual? Wheezing? Have you needed increased oxygen? Are you taking your respiratory medications? What over the counter measures have you tried?)  Allergies  Allergen Reactions  . Keflex [Cephalexin] Diarrhea  . Penicillins Nausea Only and Nausea And Vomiting    REACTION: nausea and vomiting REACTION: nausea and vomiting REACTION: nausea and vomiting    Immunization History  Administered Date(s) Administered  . PFIZER SARS-COV-2 Vaccination 08/07/2019

## 2020-03-14 NOTE — Telephone Encounter (Signed)
Started to hear that her breathing is worsened with the lower dose of prednisone please increase to 10 mg daily and hold at this dose until seen by Dr. Vaughan Browner   Please order a new nasal cannula through her DME as it is not good to use damaged equipment  Please contact office for sooner follow up if symptoms do not improve or worsen or seek emergency care

## 2020-03-15 DIAGNOSIS — J9621 Acute and chronic respiratory failure with hypoxia: Secondary | ICD-10-CM | POA: Diagnosis not present

## 2020-03-19 ENCOUNTER — Ambulatory Visit: Payer: Medicare HMO | Admitting: Pulmonary Disease

## 2020-03-19 ENCOUNTER — Telehealth: Payer: Self-pay | Admitting: Pulmonary Disease

## 2020-03-19 DIAGNOSIS — J449 Chronic obstructive pulmonary disease, unspecified: Secondary | ICD-10-CM

## 2020-03-19 NOTE — Telephone Encounter (Signed)
Called and spoke with patient.  She states the prednisone 10 mg has helped her to take a deep breath.  She would really like to get off the prednisone.  She had to change her f/u appointment with Dr. Vaughan Browner d/t a schedule change and does not have a f/u until 04/23/20.  Dr. Vaughan Browner, please advise on prednisone dose until she follows up with you in the office.  Thank you.

## 2020-03-20 NOTE — Telephone Encounter (Signed)
Spoke with pt and advised on recommendations for prednisone dosage per Dr Vaughan Browner. Pt verbalized understanding. Nothing further needed at this time.

## 2020-03-20 NOTE — Telephone Encounter (Signed)
Can reduce to 7.5 mg of prednisone for 2 weeks and then 5 mg of prednisone.  Hold at that dose until she can be reevaluated in clinic.

## 2020-03-20 NOTE — Telephone Encounter (Signed)
Patient is returning phone call. Patient phone number is [phone].May leave detailed on voicemail. May also leave message on mychart.

## 2020-03-20 NOTE — Telephone Encounter (Signed)
Left message for pt to call back  °

## 2020-03-29 ENCOUNTER — Ambulatory Visit: Payer: Medicare HMO | Admitting: Pulmonary Disease

## 2020-04-04 DIAGNOSIS — J449 Chronic obstructive pulmonary disease, unspecified: Secondary | ICD-10-CM | POA: Diagnosis not present

## 2020-04-15 ENCOUNTER — Ambulatory Visit: Payer: Medicare HMO | Admitting: Cardiology

## 2020-04-23 ENCOUNTER — Other Ambulatory Visit: Payer: Self-pay

## 2020-04-23 ENCOUNTER — Encounter: Payer: Self-pay | Admitting: Pulmonary Disease

## 2020-04-23 ENCOUNTER — Ambulatory Visit: Payer: Medicare HMO | Admitting: Pulmonary Disease

## 2020-04-23 VITALS — BP 142/72 | HR 95 | Temp 97.2°F | Ht 71.0 in

## 2020-04-23 DIAGNOSIS — J449 Chronic obstructive pulmonary disease, unspecified: Secondary | ICD-10-CM | POA: Diagnosis not present

## 2020-04-23 MED ORDER — AZITHROMYCIN 250 MG PO TABS: 250.0000 mg | ORAL_TABLET | Freq: Every day | ORAL | 5 refills | Status: DC

## 2020-04-23 NOTE — Patient Instructions (Signed)
Continue the inhalers as prescribed Start tapering the prednisone to 2.5 mg a day for 1 week and then stop Continue the chronic azithromycin therapy We will renew the handicap placard  Follow-up in 6 months with video visit

## 2020-04-23 NOTE — Progress Notes (Signed)
Rebecca [name]    [id number]    31-Oct-1950  Primary Care Physician:Hernandez Everardo Beals, MD  Referring Physician: Isaac Bliss, Rayford Halsted, MD [name],  [place] [id number]  Chief complaint: Follow-up for COPD  HPI: 70 year old with history of severe COPD, fibromyalgia, obesity, OSA, vitamin D deficiency, GERD She is a former patient of Dr. Lenna Gilford.  Maintained on Symbicort and Spiriva She has noted increasing dyspnea over the past year.  Has chronic cough with minimal mucus production.  She is following up with [facility] for endobronchial wall placement.  Evaluated by Dr. Kerin Ransom in June 2020.  Recommended that she needs to improve her BMI from 41 to 35 before being considered for the procedure  She has enrolled in pulmonary rehab earlier this year and attended 2 sessions but has stopped after Covid pandemic.  In addition she is finding it hard to keep the mask on during rehab due to dyspnea.  Prescribed Daliresp in early 2021.  This was poorly tolerated with nausea, diarrhea, mood changes including suicidal thoughts.  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 Exposures: No known exposures.  No mold, hot tub, Smoking history: 30-pack-year smoker.  Quit in 1993 Travel history: No significant travel history Relevant family history: No significant family history of lung disease  Interim history: On prednisone at 5 mg a day but has increased weight gain and wants to get off it She was sent to pulmonary rehab but had to stop after a few days as she could not keep the mask on due to feelings of dyspnea, suffocation. Continues on chronic daily azithromycin  Outpatient Encounter  Medications as of 04/23/2020  Medication Sig  . albuterol (VENTOLIN HFA) 108 (90 Base) MCG/ACT inhaler INHALE 2 PUFFS INTO THE LUNGS EVERY 6 HOURS AS NEEDED FOR WHEEZING OR SHORTNESS OF BREATH  . ALPRAZolam (XANAX) 0.5 MG tablet TAKE ONE TABLET BY MOUTH THREE TIMES A DAY AS NEEDED FOR NERVES MAXIMUM DAILY DOSE OF THREE TABLETS  . Apple Cider Vinegar 500 MG TABS Take 1,000 mg by mouth daily.  . Ascorbic Acid (VITAMIN C) 1000 MG tablet Take 1,000 mg by mouth daily.  Marland Kitchen azithromycin (ZITHROMAX) 250 MG tablet TAKE ONE TABLET BY MOUTH DAILY  . baclofen (LIORESAL) 10 MG tablet Take 10 mg by mouth. Taking 1 nightly  . cetirizine (ZYRTEC) 10 MG tablet Take 10 mg by mouth daily.  . Cholecalciferol (VITAMIN D-3) 5000 UNITS TABS Take 1 tablet by mouth daily. Per pt medication list, takes 1,000  . CINNAMON PO Take 325 mg by mouth every morning. Takes 2 every am  . COLCRYS 0.6 MG tablet Take 1 tablet (0.6 mg total) by mouth 2 (two) times daily.  . cyanocobalamin 2000 MCG tablet Take 2,500 mcg by mouth 2 (two) times daily.  Marland Kitchen escitalopram (LEXAPRO) 10 MG tablet Take 1 tablet (10 mg total) by mouth daily.  . fluticasone (FLONASE) 50 MCG/ACT nasal spray Place 2 sprays into both nostrils daily as needed.   . furosemide (LASIX) 40 MG tablet TAKE 1 TO 2 TABLETS BY MOUTH DAILY AS NEEDED FOR SWELLING  . glucosamine-chondroitin 500-400 MG tablet Take 2 tablets  by mouth 2 (two) times daily.   Marland Kitchen guaiFENesin (MUCINEX) 600 MG 12 hr tablet Take 400 mg by mouth 2 (two) times daily. Tales 3 tab;es twice a day  . HYDROcodone-acetaminophen (NORCO) 5-325 MG tablet Take 1 tablet by mouth every 6 (six) hours as needed for moderate pain.  Marland Kitchen ipratropium-albuterol (DUONEB) 0.5-2.5 (3) MG/3ML SOLN INHALE ONE VIAL VIA NEBULIZATION BY MOUTH THREE TIMES A DAY  . Magnesium 400 MG CAPS Take 3 capsules by mouth 2 (two) times daily.   . Misc Natural Products (TART CHERRY ADVANCED PO) Take by mouth.  . OXYGEN Inhale 4 L into the lungs.  .  predniSONE (DELTASONE) 10 MG tablet Take 1 tablet (10 mg total) by mouth daily with breakfast.  . predniSONE (DELTASONE) 5 MG tablet Take 1 tablet (5 mg total) by mouth daily with breakfast.  . sertraline (ZOLOFT) 50 MG tablet Take 1 tablet (50 mg total) by mouth daily.  . sodium chloride HYPERTONIC 3 % nebulizer solution Take by nebulization 2 (two) times daily.  Marland Kitchen Spacer/Aero-Holding Chambers (AEROCHAMBER MV) inhaler Use as instructed  . traMADol (ULTRAM) 50 MG tablet Take 1 tablet (50 mg total) by mouth 3 (three) times daily as needed for moderate pain.  . TRELEGY ELLIPTA 100-62.5-25 MCG/INH AEPB INHALE ONE PUFF BY MOUTH DAILY  . TURMERIC PO Take 500 mg by mouth 2 (two) times daily. Takes 1,000 mg  . UNABLE TO FIND Med Name: Mullien OTC  . vitamin A 10000 UNIT capsule Take 5,000 Units by mouth daily. Per pt list - takes 25,000   No facility-administered encounter medications on file as of 04/23/2020.   Physical Exam: Blood pressure (!) 142/72, pulse 95, temperature (!) 97.2 F (36.2 C), temperature source Temporal, height 5\' 11"  (1.803 m), SpO2 96 %. Gen:      No acute distress HEENT:  EOMI, sclera anicteric Neck:     No masses; no thyromegaly Lungs:    Clear to auscultation bilaterally; normal respiratory effort CV:         Regular rate and rhythm; no murmurs Abd:      + bowel sounds; soft, non-tender; no palpable masses, no distension Ext:    No edema; adequate peripheral perfusion Skin:      Warm and dry; no rash Neuro: alert and oriented x 3 Psych: normal mood and affect  Data Reviewed: Imaging: Chest x-ray 04/16/18- Emphysematous changes in the upper lobes with mild atelectasis in the right base.  CT chest ([facility]) 08/18/2018-severe centrilobular emphysema with bullous disease, possible dilatation of left renal collecting system > pt has already seen a [facility]rologist at [facility] for this  Chest x-ray 08/24/2019-emphysematous changes.  No acute abnormality.  PFTs: 06/05/2014 FVC 1.6  (50%), FEV1 0.9 [35%],/55 Severe airway obstruction  Assessment:  Severe COPD on supplemental oxygen Continue Trelegy inhaler, supplemental oxygen Currently on prednisone at 5 mg.  Will taper this to 2.5 mg/day for a week and then stop.  If she needs to go back on prednisone she would prefer Solu-Medrol p.o. Off Daliresp due to side effects She continues on chronic azithromycin daily.  Continue flutter valve, hypertonic saline for mucociliary clearance  Plan/Recommendations: Continue Trelegy inhaler Supplemental oxygen Azithromycin 250 mg daily Flutter valve, hypertonic saline, Mucinex Start tapering prednisone to 2.5 mg for a week and then stop Handicap placard renewed  Marshell Garfinkel MD [facility] 04/23/2020, 11:34 AM  CC: Isaac Bliss, [place]*

## 2020-04-26 ENCOUNTER — Other Ambulatory Visit: Payer: Self-pay | Admitting: Internal Medicine

## 2020-04-26 DIAGNOSIS — J9610 Chronic respiratory failure, unspecified whether with hypoxia or hypercapnia: Secondary | ICD-10-CM

## 2020-04-29 ENCOUNTER — Other Ambulatory Visit: Payer: Self-pay | Admitting: Pulmonary Disease

## 2020-05-02 DIAGNOSIS — J449 Chronic obstructive pulmonary disease, unspecified: Secondary | ICD-10-CM | POA: Diagnosis not present

## 2020-05-05 ENCOUNTER — Other Ambulatory Visit: Payer: Self-pay | Admitting: Internal Medicine

## 2020-05-05 DIAGNOSIS — F419 Anxiety disorder, unspecified: Secondary | ICD-10-CM

## 2020-05-07 ENCOUNTER — Encounter (INDEPENDENT_AMBULATORY_CARE_PROVIDER_SITE_OTHER): Payer: Self-pay | Admitting: Family Medicine

## 2020-05-07 ENCOUNTER — Ambulatory Visit (INDEPENDENT_AMBULATORY_CARE_PROVIDER_SITE_OTHER): Payer: Medicare HMO | Admitting: Family Medicine

## 2020-05-07 ENCOUNTER — Other Ambulatory Visit: Payer: Self-pay

## 2020-05-07 VITALS — BP 123/70 | HR 86 | Temp 98.0°F | Ht 63.0 in | Wt 250.0 lb

## 2020-05-07 DIAGNOSIS — R0602 Shortness of breath: Secondary | ICD-10-CM | POA: Diagnosis not present

## 2020-05-07 DIAGNOSIS — R5383 Other fatigue: Secondary | ICD-10-CM

## 2020-05-07 DIAGNOSIS — E1169 Type 2 diabetes mellitus with other specified complication: Secondary | ICD-10-CM | POA: Diagnosis not present

## 2020-05-07 DIAGNOSIS — K760 Fatty (change of) liver, not elsewhere classified: Secondary | ICD-10-CM

## 2020-05-07 DIAGNOSIS — F3289 Other specified depressive episodes: Secondary | ICD-10-CM

## 2020-05-07 DIAGNOSIS — Z6841 Body Mass Index (BMI) 40.0 and over, adult: Secondary | ICD-10-CM

## 2020-05-07 DIAGNOSIS — E559 Vitamin D deficiency, unspecified: Secondary | ICD-10-CM

## 2020-05-07 DIAGNOSIS — E039 Hypothyroidism, unspecified: Secondary | ICD-10-CM

## 2020-05-07 DIAGNOSIS — Z0289 Encounter for other administrative examinations: Secondary | ICD-10-CM

## 2020-05-08 LAB — VITAMIN D 25 HYDROXY (VIT D DEFICIENCY, FRACTURES): Vit D, 25-Hydroxy: 46.6 ng/mL (ref 30.0–100.0)

## 2020-05-08 LAB — COMPREHENSIVE METABOLIC PANEL
ALT: 24 IU/L (ref 0–32)
AST: 32 IU/L (ref 0–40)
Albumin/Globulin Ratio: 1.6 (ref 1.2–2.2)
Albumin: 4.2 g/dL (ref 3.7–4.7)
Alkaline Phosphatase: 81 IU/L (ref 44–121)
BUN/Creatinine Ratio: 18 (ref 12–28)
BUN: 24 mg/dL (ref 8–27)
Bilirubin Total: 0.4 mg/dL (ref 0.0–1.2)
CO2: 25 mmol/L (ref 20–29)
Calcium: 9.5 mg/dL (ref 8.7–10.3)
Chloride: 102 mmol/L (ref 96–106)
Creatinine, Ser: 1.37 mg/dL — ABNORMAL HIGH (ref 0.57–1.00)
Globulin, Total: 2.6 g/dL (ref 1.5–4.5)
Glucose: 85 mg/dL (ref 65–99)
Potassium: 5.2 mmol/L (ref 3.5–5.2)
Sodium: 142 mmol/L (ref 134–144)
Total Protein: 6.8 g/dL (ref 6.0–8.5)
eGFR: 41 mL/min/{1.73_m2} — ABNORMAL LOW (ref >59)

## 2020-05-08 LAB — CBC WITH DIFFERENTIAL/PLATELET
Basophils Absolute: 0.1 10*3/uL (ref 0.0–0.2)
Basos: 1 %
EOS (ABSOLUTE): 0.2 10*3/uL (ref 0.0–0.4)
Eos: 1 %
Hematocrit: 47.4 % — ABNORMAL HIGH (ref 34.0–46.6)
Hemoglobin: 14.9 g/dL (ref 11.1–15.9)
Immature Grans (Abs): 0.2 10*3/uL — ABNORMAL HIGH (ref 0.0–0.1)
Immature Granulocytes: 1 %
Lymphocytes Absolute: 1.6 10*3/uL (ref 0.7–3.1)
Lymphs: 11 %
MCH: 29.1 pg (ref 26.6–33.0)
MCHC: 31.4 g/dL — ABNORMAL LOW (ref 31.5–35.7)
MCV: 93 fL (ref 79–97)
Monocytes Absolute: 0.6 10*3/uL (ref 0.1–0.9)
Monocytes: 4 %
Neutrophils Absolute: 11.6 10*3/uL — ABNORMAL HIGH (ref 1.4–7.0)
Neutrophils: 82 %
Platelets: 303 10*3/uL (ref 150–450)
RBC: 5.12 x10E6/uL (ref 3.77–5.28)
RDW: 14.3 % (ref 11.7–15.4)
WBC: 14.2 10*3/uL — ABNORMAL HIGH (ref 3.4–10.8)

## 2020-05-08 LAB — T4: T4, Total: 8.5 ug/dL (ref 4.5–12.0)

## 2020-05-08 LAB — LIPID PANEL WITH LDL/HDL RATIO
Cholesterol, Total: 200 mg/dL — ABNORMAL HIGH (ref 100–199)
HDL: 43 mg/dL (ref >39)
LDL Chol Calc (NIH): 133 mg/dL — ABNORMAL HIGH (ref 0–99)
LDL/HDL Ratio: 3.1 ratio (ref 0.0–3.2)
Triglycerides: 135 mg/dL (ref 0–149)
VLDL Cholesterol Cal: 24 mg/dL (ref 5–40)

## 2020-05-08 LAB — INSULIN, RANDOM: INSULIN: 11.7 u[IU]/mL (ref 2.6–24.9)

## 2020-05-08 LAB — T3: T3, Total: 116 ng/dL (ref 71–180)

## 2020-05-08 LAB — HEMOGLOBIN A1C
Est. average glucose Bld gHb Est-mCnc: 128 mg/dL
Hgb A1c MFr Bld: 6.1 % — ABNORMAL HIGH (ref 4.8–5.6)

## 2020-05-08 LAB — TSH: TSH: 1.88 u[IU]/mL (ref 0.450–4.500)

## 2020-05-13 NOTE — Progress Notes (Signed)
Chief Complaint:   OBESITY Rebecca [name] (MR# [id number]) is a 70 y.o. female who presents for evaluation and treatment of obesity and related comorbidities. Current BMI is Body mass index is 44.29 kg/m. Rebecca [name] has been struggling with her weight for many years and has been unsuccessful in either losing weight, maintaining weight loss, or reaching her healthy weight goal.  Rebecca [name] is currently in the action stage of change and ready to dedicate time achieving and maintaining a healthier weight. Rebecca [name] is interested in becoming our patient and working on intensive lifestyle modifications including (but not limited to) diet and exercise for weight loss.  Rebecca [name] was referred by Rebecca [name]. She has a history of severe COPD. She had LapBand in 2009 by Dr. Hassell [name]. She still has significant restriction. Wake up and eat supplements with potato sticks, 1 cup coffee, with 4 tsp coffee mate + 3 tsp raw sugar; ~ 2-3 PM- [employer]izzeria or [employer] chicken cubes, 5 cheese dip + parmesan bread (eat 1/2-2/3) (feel full), 1-2 hour grazing cottage cheese with strawberry mixins, potato skins.  Rebecca [name]'s habits were reviewed today and are as follows: her desired weight loss is 120 lbs, she has been heavy most of her life, she started gaining weight since getting ill, her heaviest weight ever was 343 pounds, she is a picky eater and doesn't like to eat healthier foods, she has significant food cravings issues, she snacks frequently in the evenings, she skips meals frequently, she is frequently drinking liquids with calories, she frequently makes poor food choices, she has problems with excessive hunger and she struggles with emotional eating.  Depression Screen Rebecca [name]'s Food and Mood (modified PHQ-9) score was 15.  Depression screen Rebecca [name]  Decreased Interest 3  Down, Depressed, Hopeless 3  PHQ - 2 Score 6  Altered sleeping 1  Tired, decreased energy 3  Change in appetite 2  Feeling  bad or failure about yourself  0  Trouble concentrating 1  Moving slowly or fidgety/restless 0  Suicidal thoughts 2  PHQ-9 Score 15  Difficult doing work/chores Very difficult  Some recent data might be hidden   Subjective:   1. Other fatigue Rebecca [name] denies daytime somnolence and denies waking up still tired. Patent has a history of symptoms of none. Rebecca [name] generally gets 5 or 6 hours of sleep per night, and states that she has generally restful sleep. Snoring Unknown present. Apneic episodes are not present. Epworth Sleepiness Score is 3. EKG normal sinus rhythm at 85 bpm.  2. SOB (shortness of breath) on exertion Rebecca [name] notes increasing shortness of breath with exercising and seems to be worsening over time with weight gain. She notes getting out of breath sooner with activity than she used to. This has gotten worse recently. Rebecca [name] denies shortness of breath at rest or orthopnea. EKG normal sinus rhythm at 85 bpm.  3. Type 2 diabetes mellitus with other specified complication, without long-term current use of insulin (Rebecca [name]) Rebecca [name] is not on meds. Her last A1c was 6.2 ~ 3 years ago. She is on prednisone chronically.   4. Hypothyroidism, unspecified type Rebecca [name] is not on levothyroxine.   5. Vitamin D deficiency Rebecca [name] denies nausea, vomiting, and muscle weakness but notes fatigue. Rebecca [name] is on OTC Vit D 5,000 IU.  6. NAFLD (nonalcoholic fatty liver disease) This is a historical diagnosis. Her last LFTs were within normal limits.  7. Other depression Rebecca [name] sees a therapist for acknowledging emotional eating. She recognizes that she emotionally eats  frequently but doesn't know how to stop.  Assessment/Plan:   1. Other fatigue Rebecca [name] does feel that her weight is causing her energy to be lower than it should be. Fatigue may be related to obesity, depression or many other causes. Labs will be ordered, and in the meanwhile, Rebecca [name] will focus on self care including  making healthy food choices, increasing physical activity and focusing on stress reduction. Check labs today.  - EKG 12-Lead - CBC with Differential/Platelet - Lipid Panel With LDL/HDL Ratio - T3 - T4 - TSH  2. SOB (shortness of breath) on exertion Rebecca [name] does feel that she gets out of breath more easily that she used to when she exercises. Rebecca [name]'s shortness of breath appears to be obesity related and exercise induced. She has agreed to work on weight loss and gradually increase exercise to treat her exercise induced shortness of breath. Will continue to monitor closely.  3. Type 2 diabetes mellitus with other specified complication, without long-term current use of insulin ([name]) Good blood sugar control is important to decrease the likelihood of diabetic complications such as nephropathy, neuropathy, limb loss, blindness, coronary artery disease, and death. Intensive lifestyle modification including diet, exercise and weight loss are the first line of treatment for diabetes. Check labs today.  - Comprehensive metabolic panel - Hemoglobin A1c - Insulin, random  4. Hypothyroidism, unspecified type Patient with long-standing hypothyroidism, on levothyroxine therapy. She appears euthyroid. Orders and follow up as documented in patient record.  Counseling . Good thyroid control is important for overall health. Supratherapeutic thyroid levels are dangerous and will not improve weight loss results. . The correct way to take levothyroxine is fasting, with water, separated by at least 30 minutes from breakfast, and separated by more than 4 hours from calcium, iron, multivitamins, acid reflux medications (PPIs).   5. Vitamin D deficiency Low Vitamin D level contributes to fatigue and are associated with obesity, breast, and colon cancer. She agrees to continue to take OTC Vitamin D @5 ,000 IU everyday and will follow-up for routine testing of Vitamin D, at least 2-3 times per year to avoid  over-replacement. Check labs today.  - VITAMIN D 25 Hydroxy (Vit-D Deficiency, Fractures)  6. NAFLD (nonalcoholic fatty liver disease) We discussed the likely diagnosis of non-alcoholic fatty liver disease today and how this condition is obesity related. Rebecca [name] was educated the importance of weight loss. Rebecca [name] agreed to continue with her weight loss efforts with healthier diet and exercise as an essential part of her treatment plan. Check labs today.  7. Other depression Behavior modification techniques were discussed today to help Rebecca [name] deal with her emotional/non-hunger eating behaviors.  Orders and follow up as documented in patient record. Refer to Dr. Mallie Mussel.  8. Class 3 severe obesity with serious comorbidity and body mass index (BMI) of 40.0 to 44.9 in adult, unspecified obesity type Rebecca [name]) Rebecca [name] is currently in the action stage of change and her goal is to continue with weight loss efforts. I recommend Rebecca [name] begin the structured treatment plan as follows:  She has agreed to the Category 3 Plan.  Exercise goals: No exercise has been prescribed at this time.   Behavioral modification strategies: increasing lean protein intake, meal planning and cooking strategies and keeping healthy foods in the home.  She was informed of the importance of frequent follow-up visits to maximize her success with intensive lifestyle modifications for her multiple health conditions. She was informed we would discuss her lab results at her next visit unless there is  a critical issue that needs to be addressed sooner. Rebecca [name] agreed to keep her next visit at the agreed upon time to discuss these results.  Objective:   Blood pressure 123/70, pulse 86, temperature 98 F (36.7 C), temperature source Oral, height 5\' 3"  (1.6 m), weight 250 lb (113.4 kg), SpO2 96 %. Body mass index is 44.29 kg/m.  EKG: Normal sinus rhythm, rate 88.  Indirect Calorimeter completed today shows a VO2 of 350  and a REE of 2433.  Her calculated basal metabolic rate is 2725 thus her basal metabolic rate is better than expected.  General: Cooperative, alert, well developed, in no acute distress. HEENT: Conjunctivae and lids unremarkable. Cardiovascular: Regular rhythm.  Lungs: Normal work of breathing. Neurologic: No focal deficits.   Lab Results  Component Value Date   CREATININE 1.37 (H) 05/07/2020   BUN 24 05/07/2020   NA 142 05/07/2020   K 5.2 05/07/2020   CL 102 05/07/2020   CO2 25 05/07/2020   Lab Results  Component Value Date   ALT 24 05/07/2020   AST 32 05/07/2020   ALKPHOS 81 05/07/2020   BILITOT 0.4 05/07/2020   Lab Results  Component Value Date   HGBA1C 6.1 (H) 05/07/2020   HGBA1C 6.2 03/16/2017   HGBA1C 6.7 (H) 11/17/2013   HGBA1C 6.8 (H) 05/05/2013   HGBA1C 6.8 (H) 05/09/2012   Lab Results  Component Value Date   INSULIN 11.7 05/07/2020   Lab Results  Component Value Date   TSH 1.880 05/07/2020   Lab Results  Component Value Date   CHOL 200 (H) 05/07/2020   HDL 43 05/07/2020   LDLCALC 133 (H) 05/07/2020   TRIG 135 05/07/2020   CHOLHDL 6 05/05/2012   Lab Results  Component Value Date   WBC 14.2 (H) 05/07/2020   HGB 14.9 05/07/2020   HCT 47.4 (H) 05/07/2020   MCV 93 05/07/2020   PLT 303 05/07/2020   Lab Results  Component Value Date   IRON 78 01/09/2015   Obesity Behavioral Intervention:   Approximately 15 minutes were spent on the discussion below.  ASK: We discussed the diagnosis of obesity with Rebecca [name] today and Lenor agreed to give [place] permission to discuss obesity behavioral modification therapy today.  ASSESS: Kolbi has the diagnosis of obesity and her BMI today is 44.3. Malea is in the action stage of change.   ADVISE: Beckham was educated on the multiple health risks of obesity as well as the benefit of weight loss to improve her health. She was advised of the need for long term treatment and the importance of lifestyle  modifications to improve her current health and to decrease her risk of future health problems.  AGREE: Multiple dietary modification options and treatment options were discussed and Suni agreed to follow the recommendations documented in the above note.  ARRANGE: Anessia was educated on the importance of frequent visits to treat obesity as outlined per CMS and USPSTF guidelines and agreed to schedule her next follow up appointment today.  Attestation Statements:   Reviewed by clinician on day of visit: allergies, medications, problem list, medical history, surgical history, family history, social history, and previous encounter notes.  This is the patient's first visit at [facility]. The patient's NEW PATIENT PACKET was reviewed at length. Included in the packet: current and past health history, medications, allergies, ROS, gynecologic history (women only), surgical history, family history, social history, weight history, weight loss surgery history (for those that have had weight loss surgery),  nutritional evaluation, mood and food questionnaire, PHQ9, Epworth questionnaire, sleep habits questionnaire, patient life and health improvement goals questionnaire. These will all be scanned into the patient's chart under media.   During the visit, I independently reviewed the patient's EKG, bioimpedance scale results, and indirect calorimeter results. I used this information to tailor a meal plan for the patient that will help her to lose weight and will improve her obesity-related conditions going forward. I performed a medically necessary appropriate examination and/or evaluation. I discussed the assessment and treatment plan with the patient. The patient was provided an opportunity to ask questions and all were answered. The patient agreed with the plan and demonstrated an understanding of the instructions. Labs were ordered at this visit and will be reviewed at the next visit  unless more critical results need to be addressed immediately. Clinical information was updated and documented in the EMR.   Time spent on visit including pre-visit chart review and post-visit care was 45  minutes.   A separate 15 minutes was spent on risk counseling (see above).    Coral Ceo, am acting as transcriptionist for Coralie Common, MD.   I have reviewed the above documentation for accuracy and completeness, and I agree with the above. - Jinny Blossom, MD

## 2020-05-21 ENCOUNTER — Encounter (INDEPENDENT_AMBULATORY_CARE_PROVIDER_SITE_OTHER): Payer: Self-pay | Admitting: Family Medicine

## 2020-05-21 ENCOUNTER — Ambulatory Visit (INDEPENDENT_AMBULATORY_CARE_PROVIDER_SITE_OTHER): Payer: Medicare HMO | Admitting: Family Medicine

## 2020-05-21 ENCOUNTER — Other Ambulatory Visit: Payer: Self-pay

## 2020-05-21 VITALS — BP 147/76 | HR 100 | Temp 98.1°F | Ht 63.0 in | Wt 246.0 lb

## 2020-05-21 DIAGNOSIS — E1169 Type 2 diabetes mellitus with other specified complication: Secondary | ICD-10-CM | POA: Diagnosis not present

## 2020-05-21 DIAGNOSIS — Z6841 Body Mass Index (BMI) 40.0 and over, adult: Secondary | ICD-10-CM

## 2020-05-21 DIAGNOSIS — N1832 Chronic kidney disease, stage 3b: Secondary | ICD-10-CM

## 2020-05-21 DIAGNOSIS — E559 Vitamin D deficiency, unspecified: Secondary | ICD-10-CM | POA: Diagnosis not present

## 2020-05-21 DIAGNOSIS — E782 Mixed hyperlipidemia: Secondary | ICD-10-CM

## 2020-05-21 MED ORDER — VITAMIN D-3 125 MCG (5000 UT) PO TABS: 1.0000 | ORAL_TABLET | Freq: Every day | ORAL | Status: AC

## 2020-05-23 NOTE — Progress Notes (Signed)
Chief Complaint:   OBESITY Joanne is here to discuss her progress with her obesity treatment plan along with follow-up of her obesity related diagnoses. Merin is on the Category 3 Plan and states she is following her eating plan approximately 25% of the time. Johna states she is doing 0 minutes 0 times per week.  Today's visit was #: 2 Starting weight: 05/07/2020 Starting date: 250 lbs Today's weight: 246 lbs Today's date: 05/21/2020 Total lbs lost to date: 4 lbs Total lbs lost since last in-office visit: 4 lbs  Interim History: Gill had some issues, as lap band is still really limiting food intake. Her teeth are also an issue with getting food to break down. She is doing ok getting fruits and vegetables in. Ideally, she voices she would like to drink her nutrition. Gill doesn't care for bread. She recognizes she really craves bacon. She is wondering about if she can do some smoothies to get some nutrition in. She doesn't like yogurt much either. She is occasionally doing higher sugar fruits.  Subjective:   1. Stage 3b chronic kidney disease ([name]) Gill's creatinine 1.37 and BUN 24 (Cr stable from previous lab draw). She is not on nephr-metabolized meds.  Lab Results  Component Value Date   CREATININE 1.37 (H) 05/07/2020   CREATININE 1.40 (H) 03/29/2019   CREATININE 1.30 (H) 04/16/2018   Lab Results  Component Value Date   CREATININE 1.37 (H) 05/07/2020   BUN 24 05/07/2020   NA 142 05/07/2020   K 5.2 05/07/2020   CL 102 05/07/2020   CO2 25 05/07/2020    2. Vitamin D deficiency Gill's last Vit D level was 46.6. She takes OTC Vit D3 5,000 IU daily.  3. Mixed hyperlipidemia Gill's 05/07/2020 LDL 133, HDL 43, and triglycerides 135. She is not on statin therapy.   Lab Results  Component Value Date   ALT 24 05/07/2020   AST 32 05/07/2020   ALKPHOS 81 05/07/2020   BILITOT 0.4 05/07/2020   Lab Results  Component Value Date   CHOL 200 (H) 05/07/2020   HDL 43  05/07/2020   LDLCALC 133 (H) 05/07/2020   TRIG 135 05/07/2020   CHOLHDL 6 05/05/2012    4. Type 2 diabetes mellitus with other specified complication, without long-term current use of insulin ([name]) Gill's 05/07/2020 A1c was 6.1 and insulin level 11.7. She is not on Metformin. Pt is still doing slow taper prednisone for lung disease but reports she does occasionally overindulge in candy.  Lab Results  Component Value Date   HGBA1C 6.1 (H) 05/07/2020   HGBA1C 6.2 03/16/2017   HGBA1C 6.7 (H) 11/17/2013   Lab Results  Component Value Date   LDLCALC 133 (H) 05/07/2020   CREATININE 1.37 (H) 05/07/2020   Lab Results  Component Value Date   INSULIN 11.7 05/07/2020    Assessment/Plan:   1. Stage 3b chronic kidney disease ([place]) Lab results and trends reviewed. We discussed several lifestyle modifications today and she will continue to work on diet, exercise and weight loss efforts. Avoid nephrotoxic medications. Orders and follow up as documented in patient record. Continue to monitor and repeat labs in 3 months.  Counseling . Chronic kidney disease (CKD) happens when the kidneys are damaged over a long period of time. . Most of the time, this condition does not go away, but it can usually be controlled. Steps must be taken to slow down the kidney damage or to stop it from getting worse. . Intensive  lifestyle modifications are the first line treatment for this issue.  Marland Kitchen Avoid buying foods that are: processed, frozen, or prepackaged to avoid excess salt.  2. Vitamin D deficiency Low Vitamin D level contributes to fatigue and are associated with obesity, breast, and colon cancer. She agrees to take Vit D3, alternating 1K every other day with 2K, as per below. She will follow-up for routine testing of Vitamin D, at least 2-3 times per year to avoid over-replacement.  - Cholecalciferol (VITAMIN D-3) 125 MCG (5000 UT) TABS; Take 1 tablet by mouth daily. Alternate 1k every other day with 2k   Dispense: 30 tablet  3. Mixed hyperlipidemia Cardiovascular risk and specific lipid/LDL goals reviewed.  We discussed several lifestyle modifications today and Neomi will continue to work on diet, exercise and weight loss efforts. Orders and follow up as documented in patient record. Repeat labs in 3-4 months. Change to pescatarian plan + 300 calories.  Counseling Intensive lifestyle modifications are the first line treatment for this issue. . Dietary changes: Increase soluble fiber. Decrease simple carbohydrates. . Exercise changes: Moderate to vigorous-intensity aerobic activity 150 minutes per week if tolerated. . Lipid-lowering medications: see documented in medical record.  4. Type 2 diabetes mellitus with other specified complication, without long-term current use of insulin ([name]) Good blood sugar control is important to decrease the likelihood of diabetic complications such as nephropathy, neuropathy, limb loss, blindness, coronary artery disease, and death. Intensive lifestyle modification including diet, exercise and weight loss are the first line of treatment for diabetes. Repeat labs in 3 months.  5. Class 3 severe obesity with serious comorbidity and body mass index (BMI) of 40.0 to 44.9 in adult, unspecified obesity type [facility]) Yurani is currently in the action stage of change. As such, her goal is to continue with weight loss efforts. She has agreed to the [employer] + 300 calories.   Exercise goals: No exercise has been prescribed at this time.  Behavioral modification strategies: increasing lean protein intake, no skipping meals and planning for success.  Toia has agreed to follow-up with our clinic in 2 weeks. She was informed of the importance of frequent follow-up visits to maximize her success with intensive lifestyle modifications for her multiple health conditions.   Objective:   Blood pressure (!) 147/76, pulse 100, temperature 98.1 F (36.7 C),  temperature source Oral, height 5\' 3"  (1.6 m), weight 246 lb (111.6 kg), SpO2 96 %. Body mass index is 43.58 kg/m.  General: Cooperative, alert, well developed, in no acute distress. HEENT: Conjunctivae and lids unremarkable. Cardiovascular: Regular rhythm.  Lungs: Normal work of breathing. Neurologic: No focal deficits.   Lab Results  Component Value Date   CREATININE 1.37 (H) 05/07/2020   BUN 24 05/07/2020   NA 142 05/07/2020   K 5.2 05/07/2020   CL 102 05/07/2020   CO2 25 05/07/2020   Lab Results  Component Value Date   ALT 24 05/07/2020   AST 32 05/07/2020   ALKPHOS 81 05/07/2020   BILITOT 0.4 05/07/2020   Lab Results  Component Value Date   HGBA1C 6.1 (H) 05/07/2020   HGBA1C 6.2 03/16/2017   HGBA1C 6.7 (H) 11/17/2013   HGBA1C 6.8 (H) 05/05/2013   HGBA1C 6.8 (H) 05/09/2012   Lab Results  Component Value Date   INSULIN 11.7 05/07/2020   Lab Results  Component Value Date   TSH 1.880 05/07/2020   Lab Results  Component Value Date   CHOL 200 (H) 05/07/2020   HDL 43 05/07/2020  LDLCALC 133 (H) 05/07/2020   TRIG 135 05/07/2020   CHOLHDL 6 05/05/2012   Lab Results  Component Value Date   WBC 14.2 (H) 05/07/2020   HGB 14.9 05/07/2020   HCT 47.4 (H) 05/07/2020   MCV 93 05/07/2020   PLT 303 05/07/2020   Lab Results  Component Value Date   IRON 78 01/09/2015    Obesity Behavioral Intervention:   Approximately 15 minutes were spent on the discussion below.  ASK: We discussed the diagnosis of obesity with [name] today and Feliciana agreed to give [place] permission to discuss obesity behavioral modification therapy today.  ASSESS: Winnona has the diagnosis of obesity and her BMI today is 43.58. Blaire is in the action stage of change.   ADVISE: Ruhani was educated on the multiple health risks of obesity as well as the benefit of weight loss to improve her health. She was advised of the need for long term treatment and the importance of lifestyle  modifications to improve her current health and to decrease her risk of future health problems.  AGREE: Multiple dietary modification options and treatment options were discussed and Talaya agreed to follow the recommendations documented in the above note.  ARRANGE: Nissi was educated on the importance of frequent visits to treat obesity as outlined per CMS and USPSTF guidelines and agreed to schedule her next follow up appointment today.  Attestation Statements:   Reviewed by clinician on day of visit: allergies, medications, problem list, medical history, surgical history, family history, social history, and previous encounter notes.  Coral Ceo, am acting as transcriptionist for Coralie Common, MD.   I have reviewed the above documentation for accuracy and completeness, and I agree with the above. - Jinny Blossom, MD

## 2020-06-02 DIAGNOSIS — J449 Chronic obstructive pulmonary disease, unspecified: Secondary | ICD-10-CM | POA: Diagnosis not present

## 2020-06-03 ENCOUNTER — Other Ambulatory Visit: Payer: Self-pay | Admitting: Pulmonary Disease

## 2020-06-03 ENCOUNTER — Other Ambulatory Visit: Payer: Self-pay | Admitting: Internal Medicine

## 2020-06-03 DIAGNOSIS — F419 Anxiety disorder, unspecified: Secondary | ICD-10-CM

## 2020-06-03 DIAGNOSIS — J449 Chronic obstructive pulmonary disease, unspecified: Secondary | ICD-10-CM

## 2020-06-03 DIAGNOSIS — F339 Major depressive disorder, recurrent, unspecified: Secondary | ICD-10-CM

## 2020-06-05 ENCOUNTER — Other Ambulatory Visit: Payer: Self-pay

## 2020-06-05 ENCOUNTER — Encounter (INDEPENDENT_AMBULATORY_CARE_PROVIDER_SITE_OTHER): Payer: Self-pay | Admitting: Family Medicine

## 2020-06-05 ENCOUNTER — Ambulatory Visit (INDEPENDENT_AMBULATORY_CARE_PROVIDER_SITE_OTHER): Payer: Medicare HMO | Admitting: Family Medicine

## 2020-06-05 VITALS — BP 146/80 | HR 89 | Temp 98.2°F | Ht 63.0 in

## 2020-06-05 DIAGNOSIS — E1169 Type 2 diabetes mellitus with other specified complication: Secondary | ICD-10-CM | POA: Diagnosis not present

## 2020-06-05 DIAGNOSIS — F3289 Other specified depressive episodes: Secondary | ICD-10-CM

## 2020-06-05 DIAGNOSIS — Z6841 Body Mass Index (BMI) 40.0 and over, adult: Secondary | ICD-10-CM
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
## 2020-06-10 NOTE — Progress Notes (Signed)
Chief Complaint:   OBESITY Rebecca [name] is here to discuss her progress with her obesity treatment plan along with follow-up of her obesity related diagnoses. Rebecca [name] is on the [employer] + 300 calories and states she is following her eating plan approximately 50% of the time. Rebecca [name] states she is not currently exercising.  Today's visit was #: 3 Starting weight: 250 lbs Starting date: 05/07/2020 Today's weight: 252 lbs Today's date: 06/05/2020 Total lbs lost to date: 0 Total lbs lost since last in-office visit: 0  Interim History: Rebecca [name] went on a binge- went on prednisone due to breathing exacerbation. She voices she has a significant amount of stress at home and thinks prednisone and stress has led to some binge type behaviors. She feels physically limited due to moving out of her house. She is wondering about protein capsules to help supplement current protein requirements. Looking for easier food prep/ordering out options.  Subjective:   1. Other depression Rebecca [name] denies suicidal or homicidal ideations. She is on Zoloft and Xanax. Pt is really struggling with emotional eating, which she does for comfort.   2. Type 2 diabetes mellitus with other specified complication, without long-term current use of insulin ([name]) Rebecca [name]'s last A1c 6.1 and insulin level 11.7. She is not on medication. She does report sweet/carb cravings.  Assessment/Plan:   1. Other depression Behavior modification techniques were discussed today to help Rebecca [name] deal with her emotional/non-hunger eating behaviors.  Orders and follow up as documented in patient record. Refer to Dr. Mallie [name]. Pt is to follow up with Dr. Jerilee Hoh about medication management. Pt likely needs an increase in dose or change in medication.  2. Type 2 diabetes mellitus with other specified complication, without long-term current use of insulin ([name]) Good blood sugar control is important to decrease the likelihood of  diabetic complications such as nephropathy, neuropathy, limb loss, blindness, coronary artery disease, and death. Intensive lifestyle modification including diet, exercise and weight loss are the first line of treatment for diabetes. Transition to journaling. Follow up labs in 3 months.  3. Class 3 severe obesity with serious comorbidity and body mass index (BMI) of 40.0 to 44.9 in adult, unspecified obesity type Rebecca [name]) Rebecca [name] is currently in the action stage of change. As such, her goal is to continue with weight loss efforts. She has agreed to keeping a food journal and adhering to recommended goals of 1450-1600 calories and 90+ g protein and the Pescatarian Plan + 300 calories.   Exercise goals: No exercise has been prescribed at this time.  Behavioral modification strategies: increasing lean protein intake, meal planning and cooking strategies and keeping healthy foods in the home.  Rebecca [name] has agreed to follow-up with our clinic in 2-3 weeks. She was informed of the importance of frequent follow-up visits to maximize her success with intensive lifestyle modifications for her multiple health conditions.   Objective:   Blood pressure (!) 146/80, pulse 89, temperature 98.2 F (36.8 C), height 5\' 3"  (1.6 m), SpO2 96 %. Body mass index is 43.58 kg/m.  General: Cooperative, alert, well developed, in no acute distress. HEENT: Conjunctivae and lids unremarkable. Cardiovascular: Regular rhythm.  Lungs: Normal work of breathing. Neurologic: No focal deficits.   Lab Results  Component Value Date   CREATININE 1.37 (H) 05/07/2020   BUN 24 05/07/2020   NA 142 05/07/2020   K 5.2 05/07/2020   CL 102 05/07/2020   CO2 25 05/07/2020   Lab Results  Component Value Date   ALT 24  05/07/2020   AST 32 05/07/2020   ALKPHOS 81 05/07/2020   BILITOT 0.4 05/07/2020   Lab Results  Component Value Date   HGBA1C 6.1 (H) 05/07/2020   HGBA1C 6.2 03/16/2017   HGBA1C 6.7 (H) 11/17/2013   HGBA1C 6.8  (H) 05/05/2013   HGBA1C 6.8 (H) 05/09/2012   Lab Results  Component Value Date   INSULIN 11.7 05/07/2020   Lab Results  Component Value Date   TSH 1.880 05/07/2020   Lab Results  Component Value Date   CHOL 200 (H) 05/07/2020   HDL 43 05/07/2020   LDLCALC 133 (H) 05/07/2020   TRIG 135 05/07/2020   CHOLHDL 6 05/05/2012   Lab Results  Component Value Date   WBC 14.2 (H) 05/07/2020   HGB 14.9 05/07/2020   HCT 47.4 (H) 05/07/2020   MCV 93 05/07/2020   PLT 303 05/07/2020   Lab Results  Component Value Date   IRON 78 01/09/2015     Attestation Statements:   Reviewed by clinician on day of visit: allergies, medications, problem list, medical history, surgical history, family history, social history, and previous encounter notes.  Time spent on visit including pre-visit chart review and post-visit care and charting was 20 minutes.   Coral Ceo, am acting as transcriptionist for Coralie Common, MD.  I have reviewed the above documentation for accuracy and completeness, and I agree with the above. - Jinny Blossom, MD

## 2020-06-12 ENCOUNTER — Ambulatory Visit (INDEPENDENT_AMBULATORY_CARE_PROVIDER_SITE_OTHER): Payer: Medicare HMO | Admitting: Internal Medicine

## 2020-06-12 ENCOUNTER — Encounter: Payer: Self-pay | Admitting: Internal Medicine

## 2020-06-12 ENCOUNTER — Other Ambulatory Visit: Payer: Self-pay

## 2020-06-12 VITALS — BP 130/70 | HR 107 | Temp 98.0°F | Ht 63.0 in | Wt 252.0 lb

## 2020-06-12 DIAGNOSIS — J449 Chronic obstructive pulmonary disease, unspecified: Secondary | ICD-10-CM

## 2020-06-12 DIAGNOSIS — Z0000 Encounter for general adult medical examination without abnormal findings: Secondary | ICD-10-CM

## 2020-06-12 DIAGNOSIS — N1832 Chronic kidney disease, stage 3b: Secondary | ICD-10-CM

## 2020-06-12 DIAGNOSIS — E039 Hypothyroidism, unspecified: Secondary | ICD-10-CM

## 2020-06-12 DIAGNOSIS — F419 Anxiety disorder, unspecified: Secondary | ICD-10-CM

## 2020-06-12 DIAGNOSIS — F339 Major depressive disorder, recurrent, unspecified: Secondary | ICD-10-CM

## 2020-06-12 DIAGNOSIS — E559 Vitamin D deficiency, unspecified: Secondary | ICD-10-CM | POA: Diagnosis not present

## 2020-06-12 MED ORDER — SERTRALINE HCL 100 MG PO TABS: 100.0000 mg | ORAL_TABLET | Freq: Every day | ORAL | 1 refills | Status: AC

## 2020-06-12 NOTE — Progress Notes (Signed)
Established Patient Office Visit     This visit occurred during the SARS-CoV-2 public health emergency.  Safety protocols were in place, including screening questions prior to the visit, additional usage of staff PPE, and extensive cleaning of exam room while observing appropriate contact time as indicated for disinfecting solutions.    CC/Reason for Visit: Annual preventive exam, subsequent Medicare wellness visit, discuss depression  HPI: Rebecca [name] is a 70 y.o. female who is coming in today for the above mentioned reasons. 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  She also has a history of morbid obesity, GERD, obstructive sleep apnea not on CPAP therapy, stage III chronic kidney disease, vitamin D deficiency and hypothyroidism.  She is fully vaccinated against COVID, she states she has had 2 pneumonia vaccines although I do not have any on file, declines further vaccines today.  Per her chart it would appear she is overdue for tetanus as well.  She declines further cancer screening due to age and comorbidities.  She is overdue for eye or dental care.  Denies any hearing issues.  She has had worsening depression.  She calls a "situational depression" because she knows that she will die because of her lung disease.  She has started seeing the weight and wellness clinic.  She tells me she has an appointment to see their psychologist for next week.   Past Medical/Surgical History: Past Medical History:  Diagnosis Date  . Anxiety   . Asthma   . Back pain   . Bilateral swelling of feet   . Chest pain   . Chest pain   . Chewing difficulty   . Chronic venous insufficiency   . COPD (chronic obstructive pulmonary disease) ([place])   . Depression   . DJD (degenerative joint disease)   . Drug use   . Dysthymia   .  Emphysema/COPD ([place])   . Fatty liver   . Fibromyalgia   . Gallbladder problem   . GERD (gastroesophageal reflux disease)   . Gout   . History of headache   . Hodgkin lymphoma of extranodal or solid organ site [facility])   . Hypothyroid   . IBS (irritable bowel syndrome)   . Infertility, female   . Joint pain   . Kidney problem   . Metabolic syndrome X   . Morbid obesity ([place])   . OSA (obstructive sleep apnea)   . Osteoarthritis   . SOB (shortness of breath)   . Swallowing difficulty   . Vitamin B12 deficiency   . Vitamin D deficiency     Past Surgical History:  Procedure Laterality Date  . APPENDECTOMY    . BILATERAL SALPINGOOPHORECTOMY  2007   [facility]  . CHOLECYSTECTOMY  1982  . LAPAROSCOPIC GASTRIC BANDING  08/2007   Dr. Hassell Done  . LAPAROSCOPIC HYSTERECTOMY  2007   [facility]  . 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 Her smoking use included cigarettes. She started smoking about 58 years ago. She has a 29.00 pack-year smoking history. She has never used smokeless tobacco. She reports that she does not drink alcohol and does not use drugs.  Allergies: Allergies  Allergen Reactions  . Keflex [Cephalexin] Diarrhea  . Penicillins Nausea Only and Nausea And Vomiting  REACTION: nausea and vomiting REACTION: nausea and vomiting REACTION: nausea and vomiting    Family History:  Family History  Problem Relation Age of Onset  . Heart failure Father   . Cancer Father   . Parkinson's disease Mother   . Endometrial cancer Mother   . Thyroid disease Mother   . Cancer Mother   . Obesity Mother   . Heart attack Sister   . Diabetes Mellitus II Brother      Current Outpatient Medications:  .  albuterol (VENTOLIN HFA) 108 (90 Base) MCG/ACT inhaler, INHALE 2 PUFFS INTO THE LUNGS EVERY 6 HOURS AS NEEDED FOR WHEEZING OR SHORTNESS OF BREATH, Disp: 18 g, Rfl: 11 .  ALPRAZolam (XANAX) 0.5 MG tablet, TAKE ONE  TABLET BY MOUTH THREE TIMES A DAY AS NEEDED FOR NERVES. MAXIMUM DAILY DOSE OF THREE TABLETS, Disp: 90 tablet, Rfl: 0 .  APPLE CIDER VINEGAR PO, Take 1,000 mg by mouth daily., Disp: , Rfl:  .  Ascorbic Acid (VITAMIN C) 1000 MG tablet, Take 1,000 mg by mouth daily., Disp: , Rfl:  .  azithromycin (ZITHROMAX) 250 MG tablet, TAKE ONE TABLET BY MOUTH DAILY, Disp: 30 tablet, Rfl: 5 .  cetirizine (ZYRTEC) 10 MG tablet, Take 10 mg by mouth daily., Disp: , Rfl:  .  Cholecalciferol (VITAMIN D-3) 125 MCG (5000 UT) TABS, Take 1 tablet by mouth daily. Alternate 1k every other day with 2k, Disp: 30 tablet, Rfl:  .  CINNAMON PO, Take 2,000 mg by mouth every morning. Takes 2 every am, Disp: , Rfl:  .  COLCRYS 0.6 MG tablet, Take 1 tablet (0.6 mg total) by mouth 2 (two) times daily., Disp: 60 tablet, Rfl: 2 .  furosemide (LASIX) 40 MG tablet, TAKE ONE TO TWO TABLETS BY MOUTH EVERY DAY AS NEEDED FOR SWELLING, Disp: 180 tablet, Rfl: 0 .  glucosamine-chondroitin 500-400 MG tablet, Take 2 tablets by mouth 2 (two) times daily. , Disp: , Rfl:  .  GUAIFENESIN ER PO, Take 400 mg by mouth 2 (two) times daily. Tales 3 tab;es twice a day, Disp: , Rfl:  .  HYDROcodone-acetaminophen (NORCO) 5-325 MG tablet, Take 1 tablet by mouth every 6 (six) hours as needed for moderate pain., Disp: 20 tablet, Rfl: 0 .  ipratropium-albuterol (DUONEB) 0.5-2.5 (3) MG/3ML SOLN, INHALE ONE VIAL VIA NEBULIZATION BY MOUTH THREE TIMES A DAY, Disp: 3 mL, Rfl: 2 .  Magnesium 400 MG CAPS, Take 3 capsules by mouth 2 (two) times daily. , Disp: , Rfl:  .  Misc Natural Products (TART CHERRY ADVANCED PO), Take by mouth., Disp: , Rfl:  .  OVER THE COUNTER MEDICATION, CBD Oil, Disp: , Rfl:  .  OXYGEN, Inhale 4 L into the lungs., Disp: , Rfl:  .  predniSONE (DELTASONE) 10 MG tablet, Take 1 tablet (10 mg total) by mouth daily with breakfast., Disp: 30 tablet, Rfl: 2 .  sertraline (ZOLOFT) 100 MG tablet, Take 1 tablet (100 mg total) by mouth daily., Disp: 90  tablet, Rfl: 1 .  sodium chloride HYPERTONIC 3 % nebulizer solution, Take by nebulization 2 (two) times daily., Disp: 750 mL, Rfl: 12 .  Spacer/Aero-Holding Chambers (AEROCHAMBER MV) inhaler, Use as instructed, Disp: 1 each, Rfl: 0 .  traMADol (ULTRAM) 50 MG tablet, Take 1 tablet (50 mg total) by mouth 3 (three) times daily as needed for moderate pain., Disp: 90 tablet, Rfl: 3 .  TRELEGY ELLIPTA 100-62.5-25 MCG/INH AEPB, INHALE ONE PUFF BY MOUTH DAILY, Disp: 60 each, Rfl: 3 .  TURMERIC PO, Take 1,000 mg by mouth 2 (two) times daily. Takes 1,000 mg, Disp: , Rfl:  .  UNABLE TO FIND, Med Name: [place] OTC, Disp: , Rfl:  .  vitamin A 10000 UNIT capsule, Take 5,000 Units by mouth daily. Per pt list - takes 25,000, Disp: , Rfl:  .  predniSONE (DELTASONE) 5 MG tablet, Take 1 tablet (5 mg total) by mouth daily with breakfast. (Patient not taking: Reported on 06/12/2020), Disp: 14 tablet, Rfl: 0  Review of Systems:  Constitutional: Denies fever, chills, diaphoresis, appetite change. HEENT: Denies photophobia, eye pain, redness, hearing loss, ear pain, congestion, sore throat, rhinorrhea, sneezing, mouth sores, trouble swallowing, neck pain, neck stiffness and tinnitus.   Respiratory: Denies  cough, chest tightness,  and wheezing.   Cardiovascular: Denies chest pain, palpitations and leg swelling.  Gastrointestinal: Denies nausea, vomiting, abdominal pain, diarrhea, constipation, blood in stool and abdominal distention.  Genitourinary: Denies dysuria, urgency, frequency, hematuria, flank pain and difficulty urinating.  Endocrine: Denies: hot or cold intolerance, sweats, changes in hair or nails, polyuria, polydipsia. Musculoskeletal: Denies myalgias, back pain, joint swelling, arthralgias and gait problem.  Skin: Denies pallor, rash and wound.  Neurological: Denies dizziness, seizures, syncope, weakness, light-headedness, numbness and headaches.  Hematological: Denies adenopathy. Easy bruising, personal or  family bleeding history  Psychiatric/Behavioral: Denies suicidal ideation, mood changes, confusion, nervousness, sleep disturbance and agitation    Physical Exam: Vitals:   06/12/20 1349  BP: 130/70  Pulse: (!) 107  Temp: 98 F (36.7 C)  TempSrc: Oral  SpO2: 93%  Weight: 252 lb (114.3 kg)  Height: '5\' 3"'  (1.6 m)    Body mass index is 44.64 kg/m.   Constitutional: NAD, calm, comfortable Eyes: PERRL, lids and conjunctivae normal ENMT: Mucous membranes are moist. Posterior pharynx clear of any exudate or lesions. Normal dentition. Tympanic membrane is pearly white, no erythema or bulging. Neck: normal, supple, no masses, no thyromegaly Respiratory: clear to auscultation bilaterally, no wheezing, no crackles. Normal respiratory effort. No accessory muscle use.  Cardiovascular: Regular rate and rhythm, no murmurs / rubs / gallops. No extremity edema. 2+ pedal pulses. Abdomen: no tenderness, no masses palpated. No hepatosplenomegaly. Bowel sounds positive.  Musculoskeletal: no clubbing / cyanosis. No joint deformity upper and lower extremities. Good ROM, no contractures. Normal muscle tone.  Skin: no rashes, lesions, ulcers. No induration Neurologic: CN 2-12 grossly intact. Sensation intact, DTR normal. Strength 5/5 in all 4.  Psychiatric: Normal judgment and insight. Alert and oriented x 3. Normal mood.    Subsequent Medicare wellness visit   1. Risk factors, based on past  M,S,F -cardiovascular disease risk factors include age, obesity   2.  Physical activities: Very sedentary due to her end-stage COPD   3.  Depression/mood:  Extremely depressed today with a high PHQ-9 score   4.  Hearing:  No perceived issues   5.  ADL's: Independent in all ADLs   6.  Fall risk:  Low fall risk   7.  Home safety: No problems identified   8.  Height weight, and visual acuity: height and weight as above, vision:   Visual Acuity Screening   Right eye Left eye Both eyes  Without  correction: na na na  With correction:     Unable to do vision exam today   9.  Counseling:  I have advised continued weight loss, update vaccination status   10. Lab orders based on risk factors: Laboratory update will be reviewed   11. Referral :  None today   12. Care plan:  Follow-up with me in 3 months   13. Cognitive assessment:  No cognitive impairment   14. Screening: Patient provided with a written and personalized 5-10 year screening schedule in the AVS.   yes   15. Provider List Update:   PCP, obesity specialist Dr. Jearld Shines, pulmonologist Dr. Vaughan Browner  16. Advance Directives: Full code   17. Opioids: Patient is not on any opioid prescriptions and has no risk factors for a substance use disorder.   [place] Office Visit from 06/12/2020 in [place] [facility] [place]  PHQ-9 Total Score 23      Fall Risk  06/12/2020 04/25/2018 03/08/2018 01/10/2018  Falls in the past year? 0 0 0 0  Number falls in past yr: 0 - 0 0  Injury with Fall? 0 - 0 0  Follow up - - - Falls evaluation completed     Impression and Plan:  Encounter for preventive health examination -I have recommended eye and dental care. -I have recommended that she update her pneumonia vaccination status and Tdap as I do not have those on file, she declines. -She just had labs in March. -Healthy lifestyle discussed in detail, activity is limited by her severe COPD. -We will defer all cancer screening due to her advanced COPD.  Stage 4 very severe COPD by GOLD classification [facility]) -This is her major comorbidity.  She has very advanced COPD, she is constantly short of breath.  She was supposed to be on 2.5 mg of prednisone but as of last week she has increased it back up to 10. -Have advised follow-up with her pulmonologist.  Morbid obesity ([place]) -Discussed healthy lifestyle, including increased physical activity and better food choices to promote weight loss. -She has been congratulated on her weight  loss success thus far.  Encouraged to continue follow-ups with the weight and wellness clinic.  Vitamin D deficiency -She is giving over-the-counter vitamin D supplementation.  Stage 3b chronic kidney disease ([place]) -Last creatinine was 1.370 in March 2022  Hypothyroidism, unspecified type -Most recent TSH was normal at 1.880 in March 2022.  She is currently not on thyroid supplementation.  Depression, recurrent ([place])  Anxiety -Very depressed today. -PHQ-9 score is 23. -Increase sertraline from 50 to 100 mg. -She already has counseling session scheduled for next week. -Follow-up with me in 2 to 3 months.    Patient Instructions   -Nice seeing you today!!  -Increase zoloft to 100 mg daily.  -Schedule follow up in 3 months.   Preventive Care 80 Years and Older, Female Preventive care refers to lifestyle choices and visits with your health care provider that can promote health and wellness. This includes:  A yearly physical exam. This is also called an annual wellness visit.  Regular dental and eye exams.  Immunizations.  Screening for certain conditions.  Healthy lifestyle choices, such as: ? Eating a healthy diet. ? Getting regular exercise. ? Not using drugs or products that contain nicotine and tobacco. ? Limiting alcohol use. What can I expect for my preventive care visit? Physical exam Your health care provider will check your:  Height and weight. These may be used to calculate your BMI (body mass index). BMI is a measurement that tells if you are at a healthy weight.  Heart rate and blood pressure.  Body temperature.  Skin for abnormal spots. Counseling Your health care provider may ask you questions about your:  Past medical problems.  Family's medical history.  Alcohol,  tobacco, and drug use.  Emotional well-being.  Home life and relationship well-being.  Sexual activity.  Diet, exercise, and sleep habits.  History of falls.  Memory and  ability to understand (cognition).  Work and work Statistician.  Pregnancy and menstrual history.  Access to firearms. What immunizations do I need? Vaccines are usually given at various ages, according to a schedule. Your health care provider will recommend vaccines for you based on your age, medical history, and lifestyle or other factors, such as travel or where you work.   What tests do I need? Blood tests  Lipid and cholesterol levels. These may be checked every 5 years, or more often depending on your overall health.  Hepatitis C test.  Hepatitis B test. Screening  Lung cancer screening. You may have this screening every year starting at age 31 if you have a 30-pack-year history of smoking and currently smoke or have quit within the past 15 years.  Colorectal cancer screening. ? All adults should have this screening starting at age 37 and continuing until age 49. ? Your health care provider may recommend screening at age 73 if you are at increased risk. ? You will have tests every 1-10 years, depending on your results and the type of screening test.  Diabetes screening. ? This is done by checking your blood sugar (glucose) after you have not eaten for a while (fasting). ? You may have this done every 1-3 years.  Mammogram. ? This may be done every 1-2 years. ? Talk with your health care provider about how often you should have regular mammograms.  Abdominal aortic aneurysm (AAA) screening. You may need this if you are a current or former smoker.  BRCA-related cancer screening. This may be done if you have a family history of breast, ovarian, tubal, or peritoneal cancers. Other tests  STD (sexually transmitted disease) testing, if you are at risk.  Bone density scan. This is done to screen for osteoporosis. You may have this done starting at age 23. Talk with your health care provider about your test results, treatment options, and if necessary, the need for more  tests. Follow these instructions at home: Eating and drinking  Eat a diet that includes fresh fruits and vegetables, whole grains, lean protein, and low-fat dairy products. Limit your intake of foods with high amounts of sugar, saturated fats, and salt.  Take vitamin and mineral supplements as recommended by your health care provider.  Do not drink alcohol if your health care provider tells you not to drink.  If you drink alcohol: ? Limit how much you have to 0-1 drink a day. ? Be aware of how much alcohol is in your drink. In the [place] one drink equals one 12 oz bottle of beer (355 mL), one 5 oz glass of wine (148 mL), or one 1 oz glass of hard liquor (44 mL).   Lifestyle  Take daily care of your teeth and gums. Brush your teeth every morning and night with fluoride toothpaste. Floss one time each day.  Stay active. Exercise for at least 30 minutes 5 or more days each week.  Do not use any products that contain nicotine or tobacco, such as cigarettes, e-cigarettes, and chewing tobacco. If you need help quitting, ask your health care provider.  Do not use drugs.  If you are sexually active, practice safe sex. Use a condom or other form of protection in order to prevent STIs (sexually transmitted infections).  Talk with your health care  provider about taking a low-dose aspirin or statin.  Find healthy ways to cope with stress, such as: ? Meditation, yoga, or listening to music. ? Journaling. ? Talking to a trusted person. ? Spending time with friends and family. Safety  Always wear your seat belt while driving or riding in a vehicle.  Do not drive: ? If you have been drinking alcohol. Do not ride with someone who has been drinking. ? When you are tired or distracted. ? While texting.  Wear a helmet and other protective equipment during sports activities.  If you have firearms in your house, make sure you follow all gun safety procedures. What's next?  Visit your health  care provider once a year for an annual wellness visit.  Ask your health care provider how often you should have your eyes and teeth checked.  Stay up to date on all vaccines. This information is not intended to replace advice given to you by your health care provider. Make sure you discuss any questions you have with your health care provider. Document Revised: 02/07/2020 Document Reviewed: 02/10/2018 Elsevier Patient Education  2021 [place], [place] [facility]

## 2020-06-12 NOTE — Patient Instructions (Signed)
-Nice seeing you today!!  -Increase zoloft to 100 mg daily.  -Schedule follow up in 3 months.   Preventive Care 72 Years and Older, Female Preventive care refers to lifestyle choices and visits with your health care provider that can promote health and wellness. This includes:  A yearly physical exam. This is also called an annual wellness visit.  Regular dental and eye exams.  Immunizations.  Screening for certain conditions.  Healthy lifestyle choices, such as: ? Eating a healthy diet. ? Getting regular exercise. ? Not using drugs or products that contain nicotine and tobacco. ? Limiting alcohol use. What can I expect for my preventive care visit? Physical exam Your health care provider will check your:  Height and weight. These may be used to calculate your BMI (body mass index). BMI is a measurement that tells if you are at a healthy weight.  Heart rate and blood pressure.  Body temperature.  Skin for abnormal spots. Counseling Your health care provider may ask you questions about your:  Past medical problems.  Family's medical history.  Alcohol, tobacco, and drug use.  Emotional well-being.  Home life and relationship well-being.  Sexual activity.  Diet, exercise, and sleep habits.  History of falls.  Memory and ability to understand (cognition).  Work and work Statistician.  Pregnancy and menstrual history.  Access to firearms. What immunizations do I need? Vaccines are usually given at various ages, according to a schedule. Your health care provider will recommend vaccines for you based on your age, medical history, and lifestyle or other factors, such as travel or where you work.   What tests do I need? Blood tests  Lipid and cholesterol levels. These may be checked every 5 years, or more often depending on your overall health.  Hepatitis C test.  Hepatitis B test. Screening  Lung cancer screening. You may have this screening every year  starting at age 72 if you have a 30-pack-year history of smoking and currently smoke or have quit within the past 15 years.  Colorectal cancer screening. ? All adults should have this screening starting at age 72 and continuing until age 50. ? Your health care provider may recommend screening at age 18 if you are at increased risk. ? You will have tests every 1-10 years, depending on your results and the type of screening test.  Diabetes screening. ? This is done by checking your blood sugar (glucose) after you have not eaten for a while (fasting). ? You may have this done every 1-3 years.  Mammogram. ? This may be done every 1-2 years. ? Talk with your health care provider about how often you should have regular mammograms.  Abdominal aortic aneurysm (AAA) screening. You may need this if you are a current or former smoker.  BRCA-related cancer screening. This may be done if you have a family history of breast, ovarian, tubal, or peritoneal cancers. Other tests  STD (sexually transmitted disease) testing, if you are at risk.  Bone density scan. This is done to screen for osteoporosis. You may have this done starting at age 72. Talk with your health care provider about your test results, treatment options, and if necessary, the need for more tests. Follow these instructions at home: Eating and drinking  Eat a diet that includes fresh fruits and vegetables, whole grains, lean protein, and low-fat dairy products. Limit your intake of foods with high amounts of sugar, saturated fats, and salt.  Take vitamin and mineral supplements as recommended by  your health care provider.  Do not drink alcohol if your health care provider tells you not to drink.  If you drink alcohol: ? Limit how much you have to 0-1 drink a day. ? Be aware of how much alcohol is in your drink. In the [place] one drink equals one 12 oz bottle of beer (355 mL), one 5 oz glass of wine (148 mL), or one 1 oz glass of  hard liquor (44 mL).   Lifestyle  Take daily care of your teeth and gums. Brush your teeth every morning and night with fluoride toothpaste. Floss one time each day.  Stay active. Exercise for at least 30 minutes 5 or more days each week.  Do not use any products that contain nicotine or tobacco, such as cigarettes, e-cigarettes, and chewing tobacco. If you need help quitting, ask your health care provider.  Do not use drugs.  If you are sexually active, practice safe sex. Use a condom or other form of protection in order to prevent STIs (sexually transmitted infections).  Talk with your health care provider about taking a low-dose aspirin or statin.  Find healthy ways to cope with stress, such as: ? Meditation, yoga, or listening to music. ? Journaling. ? Talking to a trusted person. ? Spending time with friends and family. Safety  Always wear your seat belt while driving or riding in a vehicle.  Do not drive: ? If you have been drinking alcohol. Do not ride with someone who has been drinking. ? When you are tired or distracted. ? While texting.  Wear a helmet and other protective equipment during sports activities.  If you have firearms in your house, make sure you follow all gun safety procedures. What's next?  Visit your health care provider once a year for an annual wellness visit.  Ask your health care provider how often you should have your eyes and teeth checked.  Stay up to date on all vaccines. This information is not intended to replace advice given to you by your health care provider. Make sure you discuss any questions you have with your health care provider. Document Revised: 02/07/2020 Document Reviewed: 02/10/2018 Elsevier Patient Education  2021 [employer].

## 2020-06-13 NOTE — Telephone Encounter (Signed)
Monitor was never returned and marked "lost" in the [employer] system. Order will be cancelled 

## 2020-06-17 ENCOUNTER — Telehealth (INDEPENDENT_AMBULATORY_CARE_PROVIDER_SITE_OTHER): Payer: Medicare HMO | Admitting: Psychology

## 2020-06-17 DIAGNOSIS — F332 Major depressive disorder, recurrent severe without psychotic features: Secondary | ICD-10-CM | POA: Diagnosis not present

## 2020-06-17 DIAGNOSIS — F5089 Other specified eating disorder: Secondary | ICD-10-CM | POA: Diagnosis not present

## 2020-06-17 NOTE — Progress Notes (Signed)
Office: [phone]  /  Fax: [phone]    Date: June 24, 2020   Appointment Start Time: 11:03am Duration: 42 minutes Provider: Glennie Isle, Psy.D. Type of Session: Individual Therapy  Location of Patient: Home Location of Provider: Provider's Home (private office) Type of Contact: Telepsychological Visit via MyChart Video Visit  Session Content: This provider called Rebecca [name] at 11:02am as she did not present for the telepsychological appointment. Assistance was provided. As such, today's appointment was initiated 3 minutes late. Rebecca [name] is a 70 y.o. female presenting for a follow-up appointment. Today's appointment was a telepsychological visit due to COVID-19. Rebecca [name] provided verbal consent for today's telepsychological appointment and she is aware she is responsible for securing confidentiality on her end of the session. Prior to proceeding with today's appointment, Rebecca [name]'s physical location at the time of this appointment was obtained as well a phone number she could be reached at in the event of technical difficulties. Rebecca [name] and this provider participated in today's telepsychological service.   This provider conducted a brief check-in. [name] shared, "I'm doing much better." She indicated she received a call from [name], noting she does not want to meet with another therapist. She indicated, "I don't trust them." This provider explored and processed the aforementioned further. Notably, she reported trusting this provider to discuss weight/eating related concerns. This provider continued to recommended longer-term therapeutic services and discussed the benefit of supportive psychotherapy. Rebecca [name] shared she continues to connect with her previous therapist (Rebecca [name]) "a couple times a week" for a check-in. She added, "I'll think about it [as it relates to establishing care with [facility]." A risk assessment was completed. Rebecca [name]  denied experiencing suicidal and homicidal ideation, plan, and intent since the last appointment with this provider. She reported she continues to have easy access to the developed safety plan, and continues to acknowledge understanding regarding the importance of reaching out to trusted individuals and/or emergency resources if she is unable to ensure safety.   [name] shared her dog had her leg removed last week, adding her "girlfriend [[name]]" has been supportive. She further shared [name] helped with cleaning. Further history was gathered during today's appointment: Regarding caffeine intake, Rebecca [name] reported she occasionally consumes unsweet tea as well as coffee 3-4xs a week (~ 15 oz). She denied a history of legal involvement. Rebecca [name] reported her paternal grandfather suffered from depression. She denied a family history of substance abuse. The following strengths were identified: caring and connecting with others.   Brief psychoeducation regarding emotional versus physical hunger was provided. Daleyza was given a handout to utilize between now and the next appointment to increase awareness of hunger patterns and subsequent eating. Rebecca [name] provided verbal consent during today's appointment for this provider to send a handout about hunger via e-mail. Overall, Rebecca [name] was receptive to today's appointment as evidenced by openness to sharing, responsiveness to feedback, and willingness to discuss hunger patterns.  Mental Status Examination:  Appearance: well groomed and appropriate hygiene  Behavior: appropriate to circumstances Mood: euthymic Affect: mood congruent Speech: normal in rate, volume, and tone Eye Contact: appropriate Psychomotor Activity: unable to assess  Gait: unable to assess Thought Process: linear, logical, and goal directed  Thought Content/Perception: denies suicidal and homicidal ideation, plan, and intent and no hallucinations, delusions, bizarre thinking or  behavior reported or observed Orientation: time, person, place, and purpose of appointment Memory/Concentration: memory, attention, language, and fund of knowledge intact  Insight/Judgment: fair   Structured Assessments Results: The Patient Health Questionnaire-9 (PHQ-9) is  a self-report measure that assesses symptoms and severity of depression over the course of the last two weeks. Rebecca [name] obtained a score of 10 suggesting moderate depression. Rebecca [name] finds the endorsed symptoms to be somewhat difficult. [0= Not at all; 1= Several days; 2= More than half the days; 3= Nearly every day] Little interest or pleasure in doing things 0  Feeling down, depressed, or hopeless 1  Trouble falling or staying asleep, or sleeping too much 3  Feeling tired or having little energy- feels it is due to lack of activity and muscle decrease 3  Poor appetite or overeating 0  Feeling bad about yourself --- or that you are a failure or have let yourself or your family down 3  Trouble concentrating on things, such as reading the newspaper or watching television 0  Moving or speaking so slowly that other people could have noticed? Or the opposite --- being so fidgety or restless that you have been moving around a lot more than usual 0  Thoughts that you would be better off dead or hurting yourself in some way 0  PHQ-9 Score 10    The Generalized Anxiety Disorder-7 (GAD-7) is a brief self-report measure that assesses symptoms of anxiety over the course of the last two weeks. Rebecca [name] obtained a score of 7 suggesting mild anxiety. Rebecca [name] finds the endorsed symptoms to be somewhat difficult. [0= Not at all; 1= Several days; 2= Over half the days; 3= Nearly every day] Feeling nervous, anxious, on edge 1  Not being able to stop or control worrying- ongoing worry about well-being 3  Worrying too much about different things 0  Trouble relaxing 0  Being so restless that it's hard to sit still 0  Becoming easily  annoyed or irritable 3  Feeling afraid as if something awful might happen 0  GAD-7 Score 7   Interventions:  Conducted a brief chart review Verbally administered PHQ-9 and GAD-7 for symptom monitoring Conducted a risk assessment Provided empathic reflections and validation Employed supportive psychotherapy interventions to facilitate reduced distress and to improve coping skills with identified stressors Psychoeducation provided regarding physical versus emotional hunger Recommended/discussed option for longer-term therapeutic services  DSM-5 Diagnosis(es): 307.59 (F50.8) Other Specified Feeding or Eating Disorder, Emotional Eating Behaviors and 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  Plan: Based on appointment availability and Rebecca [name] requiring a late morning/afternoon appointment due to her sleep schedule, the next appointment will be scheduled in approximately two weeks, which will be via MyChart Video Visit. The next session will focus on working towards the established treatment goal. Additionally, Gulianna stated she would "think about" initiating therapeutic services, adding "I will consider it."

## 2020-06-24 ENCOUNTER — Telehealth (INDEPENDENT_AMBULATORY_CARE_PROVIDER_SITE_OTHER): Payer: Medicare HMO | Admitting: Psychology

## 2020-06-24 DIAGNOSIS — F5089 Other specified eating disorder: Secondary | ICD-10-CM | POA: Diagnosis not present

## 2020-06-24 DIAGNOSIS — F332 Major depressive disorder, recurrent severe without psychotic features: Secondary | ICD-10-CM | POA: Diagnosis not present

## 2020-06-25 NOTE — Progress Notes (Signed)
Office: [phone]  /  Fax: [phone]    Date: Jul 09, 2020   Appointment Start Time: 2:01pm Duration: 34 minutes Provider: Glennie Isle, Psy.D. Type of Session: Individual Therapy  Location of Patient: Home Location of Provider: Provider's Home (private office) Type of Contact: Telepsychological Visit via MyChart Video Visit  Session Content: Rebecca [name] is a 70 y.o. female presenting for a follow-up appointment to address the previously established treatment goal of increasing coping skills. Today's appointment was a telepsychological visit due to COVID-19. Rebecca [name] provided verbal consent for today's telepsychological appointment and she is aware she is responsible for securing confidentiality on her end of the session. Prior to proceeding with today's appointment, Rebecca [name]'s physical location at the time of this appointment was obtained as well a phone number she could be reached at in the event of technical difficulties. Rebecca [name] and this provider participated in today's telepsychological service.   This provider conducted a brief check-in and risk assessment. Clotine shared further about her dog, noting, "She's doing great." She shared she went by herself to the vet for her dog to have her stitches removed. She further shared her brother has decided to "deed the house to [her]." Rebecca [name] stated she is not happy about the decision, noting she has been talking to her lawyer. She noted a plan to discuss the aforementioned further with her friend when she returns from her trip this Friday. A risk assessment was completed. Rebecca [name] denied experiencing suicidal and homicidal ideation, plan, and intent since the last appointment with this provider. She reported she continues to have easy access to the developed safety plan, and continues to acknowledge understanding regarding the importance of reaching out to trusted individuals and/or emergency resources if she is unable to ensure safety.  She added, "I'm fine."   Rebecca [name] discussed challenges with sleeping, noting she reduced her Zoloft dose as she believes the sleep issue is due to the medication. Rebecca [name] further shared she will nap during the day. She also indicated challenges with breathing. She checked her O2 during this appointment; she noted it was 78 (reportedly within the limit her doctor wants it to be). It was recommended she reach out to her PCP about the aforementioned; she agreed to call her PCP during this appointment. This provider observed Rebecca [name] calling and she placed the phone on speaker. The staff member at her PCP's office took a message and indicated Dr. Ledell Noss CMA will call back.    Remainder of session focused on eating habits. Rebecca [name] reported she is "grazing." She acknowledged eating "a lot of bacon." She feels "eating is the only pleasure" she has in life. Thus, psychoeducation regarding pleasurable activities, including its impact on emotional eating and overall well-being was provided. Rebecca [name] was provided with a handout with various options of pleasurable activities, and was encouraged to engage in one activity a day and additional activities as needed when triggered to emotionally eat. Rebecca [name] agreed. Rebecca [name] provided verbal consent during today's appointment for this provider to send a handout with pleasurable activities via e-mail. Rebecca [name] was receptive to today's appointment as evidenced by openness to sharing, responsiveness to feedback, and willingness to engage in pleasurable activities to assist with coping.  Mental Status Examination:  Appearance: well groomed and appropriate hygiene  Behavior: appropriate to circumstances Mood: euthymic Affect: mood congruent Speech: normal in rate, volume, and tone Eye Contact: appropriate Psychomotor Activity: unable to assess  Gait: unable to assess Thought Process: linear, logical, and goal directed  Thought Content/Perception: denies  suicidal and  homicidal ideation, plan, and intent and no hallucinations, delusions, bizarre thinking or behavior reported or observed Orientation: time, person, place, and purpose of appointment Memory/Concentration: memory, attention, language, and fund of knowledge intact  Insight/Judgment: fair  Interventions:  Conducted a brief chart review Conducted a risk assessment Provided empathic reflections and validation Employed supportive psychotherapy interventions to facilitate reduced distress and to improve coping skills with identified stressors Psychoeducation provided regarding pleasurable activities  DSM-5 Diagnosis(es): F50.89 Other Specified Feeding or Eating Disorder, Emotional Eating Behaviors and F33.2 Major Depressive Disorder, Recurrent Episode, Severe, With Anxious Distress  Treatment Goal & Progress: During the initial appointment with this provider, the following treatment goal was established: increase coping skills. Mable has demonstrated progress in her goal as evidenced by increased awareness of hunger patterns. Panayiota also demonstrates willingness to engage in pleasurable activities.  Plan: The next appointment will be scheduled in two weeks, which will be via MyChart Video Visit. The next session will focus on working towards the established treatment goal. Additionally, Rebecca [name] reported a plan to re-initiate therapeutic services with [name] to address ongoing stressors. She also expressed willingness to sign an authorization form when she is in the office for her appointment with Dr. Jearld Shines (Jul 17, 2020) so that this provider can coordinate care with [name].

## 2020-06-26 ENCOUNTER — Ambulatory Visit (INDEPENDENT_AMBULATORY_CARE_PROVIDER_SITE_OTHER): Payer: Medicare HMO | Admitting: Family Medicine

## 2020-07-02 DIAGNOSIS — J449 Chronic obstructive pulmonary disease, unspecified: Secondary | ICD-10-CM | POA: Diagnosis not present

## 2020-07-03 ENCOUNTER — Ambulatory Visit: Payer: Medicare HMO | Admitting: Cardiology

## 2020-07-09 ENCOUNTER — Telehealth (INDEPENDENT_AMBULATORY_CARE_PROVIDER_SITE_OTHER): Payer: Medicare HMO | Admitting: Psychology

## 2020-07-09 ENCOUNTER — Telehealth: Payer: Self-pay | Admitting: Internal Medicine

## 2020-07-09 DIAGNOSIS — F5089 Other specified eating disorder: Secondary | ICD-10-CM

## 2020-07-09 DIAGNOSIS — F332 Major depressive disorder, recurrent severe without psychotic features: Secondary | ICD-10-CM | POA: Diagnosis not present

## 2020-07-09 NOTE — Telephone Encounter (Signed)
Patient is wanting Dr. Jerilee Hoh medical assistant to contact her because she has a question about the medication that she put her on because she is now having a problem sleeping.  Please advise

## 2020-07-11 NOTE — Telephone Encounter (Signed)
Left message on machine for patient.  More information needed.

## 2020-07-17 ENCOUNTER — Telehealth (INDEPENDENT_AMBULATORY_CARE_PROVIDER_SITE_OTHER): Payer: Medicare HMO | Admitting: Family Medicine

## 2020-07-17 ENCOUNTER — Other Ambulatory Visit: Payer: Self-pay

## 2020-07-17 ENCOUNTER — Encounter (INDEPENDENT_AMBULATORY_CARE_PROVIDER_SITE_OTHER): Payer: Self-pay | Admitting: Family Medicine

## 2020-07-17 DIAGNOSIS — F339 Major depressive disorder, recurrent, unspecified: Secondary | ICD-10-CM

## 2020-07-17 DIAGNOSIS — Z6841 Body Mass Index (BMI) 40.0 and over, adult: Secondary | ICD-10-CM

## 2020-07-18 NOTE — Progress Notes (Signed)
TeleHealth Visit:  Due to the COVID-19 pandemic, this visit was completed with telemedicine (audio/video) technology to reduce patient and provider exposure as well as to preserve personal protective equipment.   Rebecca [name] has verbally consented to this TeleHealth visit. The patient is located at home, the provider is located at the [facility] office. The participants in this visit include the listed provider and patient. The visit was conducted today via video.   Chief Complaint: OBESITY Rebecca [name] is here to discuss her progress with her obesity treatment plan along with follow-up of her obesity related diagnoses. Rebecca [name] is on keeping a food journal and adhering to recommended goals of 1450-1600 calories and 90+ g protein and states she is following her eating plan approximately 50% of the time. Rebecca [name] states she is not currently exercising.  Today's visit was #: 4 Starting weight: 250 lbs Starting date: 05/07/2020  Interim History: Rebecca [name] is using 6 liters of oxygen now, instead of 5 liters. She voices that she has found something called everyday with 170 calories and 20 grams protein. She mentions she doesn't think she's getting enough protein in and likely isn't eating enough. She is trying to focus on protein.  Subjective:   1. Depression, recurrent ([place]) Pt denies suicidal or homicidal ideations. She had a recent increase in Zoloft to 100 mg, but pt decreased to 50 mg due to insomnia.  Assessment/Plan:   1. Depression, recurrent ([place]) Behavior modification techniques were discussed today to help Rebecca [name] deal with her emotional/non-hunger eating behaviors.  Orders and follow up as documented in patient record.  -follow up with Dr. Jerilee Hoh- encouraged pt to increase Zoloft quantity to at least 75 mg daily.  2. Class 3 severe obesity with serious comorbidity and body mass index (BMI) of 40.0 to 44.9 in adult, unspecified obesity type [facility]) Rebecca [name] is  currently in the action stage of change. As such, her goal is to continue with weight loss efforts. She has agreed to keeping a food journal and adhering to recommended goals of 1450-1600 calories and 90+ g protein.   Exercise goals: As is  Behavioral modification strategies: increasing lean protein intake, meal planning and cooking strategies, keeping healthy foods in the home and planning for success.  Rebecca [name] has agreed to follow-up with our clinic in 2-3 weeks. She was informed of the importance of frequent follow-up visits to maximize her success with intensive lifestyle modifications for her multiple health conditions.  Objective:   VITALS: Per patient if applicable, see vitals. GENERAL: Alert and in no acute distress. CARDIOPULMONARY: No increased WOB. Speaking in clear sentences.  PSYCH: Pleasant and cooperative. Speech normal rate and rhythm. Affect is appropriate. Insight and judgement are appropriate. Attention is focused, linear, and appropriate.  NEURO: Oriented as arrived to appointment on time with no prompting.   Lab Results  Component Value Date   CREATININE 1.37 (H) 05/07/2020   BUN 24 05/07/2020   NA 142 05/07/2020   K 5.2 05/07/2020   CL 102 05/07/2020   CO2 25 05/07/2020   Lab Results  Component Value Date   ALT 24 05/07/2020   AST 32 05/07/2020   ALKPHOS 81 05/07/2020   BILITOT 0.4 05/07/2020   Lab Results  Component Value Date   HGBA1C 6.1 (H) 05/07/2020   HGBA1C 6.2 03/16/2017   HGBA1C 6.7 (H) 11/17/2013   HGBA1C 6.8 (H) 05/05/2013   HGBA1C 6.8 (H) 05/09/2012   Lab Results  Component Value Date   INSULIN 11.7 05/07/2020  Lab Results  Component Value Date   TSH 1.880 05/07/2020   Lab Results  Component Value Date   CHOL 200 (H) 05/07/2020   HDL 43 05/07/2020   LDLCALC 133 (H) 05/07/2020   TRIG 135 05/07/2020   CHOLHDL 6 05/05/2012   Lab Results  Component Value Date   WBC 14.2 (H) 05/07/2020   HGB 14.9 05/07/2020   HCT 47.4 (H)  05/07/2020   MCV 93 05/07/2020   PLT 303 05/07/2020   Lab Results  Component Value Date   IRON 78 01/09/2015    Attestation Statements:   Reviewed by clinician on day of visit: allergies, medications, problem list, medical history, surgical history, family history, social history, and previous encounter notes.  Coral Ceo, CMA, am acting as transcriptionist for Coralie Common, MD.  I have reviewed the above documentation for accuracy and completeness, and I agree with the above. - Jinny Blossom, MD

## 2020-07-19 NOTE — Telephone Encounter (Signed)
Spoke with patient and reviewed directions for Zoloft.  Nothing further is needed.

## 2020-07-23 ENCOUNTER — Telehealth (INDEPENDENT_AMBULATORY_CARE_PROVIDER_SITE_OTHER): Payer: Medicare HMO | Admitting: Psychology

## 2020-07-23 DIAGNOSIS — F5089 Other specified eating disorder: Secondary | ICD-10-CM | POA: Diagnosis not present

## 2020-07-23 DIAGNOSIS — F332 Major depressive disorder, recurrent severe without psychotic features: Secondary | ICD-10-CM | POA: Diagnosis not present

## 2020-07-23 NOTE — Progress Notes (Signed)
Office: [phone]  /  Fax: [phone]    Date: Jul 23, 2020    Appointment Start Time: 11:01am Duration: 33 minutes Provider: Glennie Isle, Psy.D. Type of Session: Individual Therapy  Location of Patient: Home Location of Provider: Provider's home (private office) Type of Contact: Telepsychological Visit via MyChart Video Visit  Session Content: Rebecca [name] is a 70 y.o. female presenting for a follow-up appointment to address the previously established treatment goal of increasing coping skills. Today's appointment was a telepsychological visit due to COVID-19. Micca provided verbal consent for today's telepsychological appointment and she is aware she is responsible for securing confidentiality on her end of the session. Prior to proceeding with today's appointment, [name]'s physical location at the time of this appointment was obtained as well a phone number she could be reached at in the event of technical difficulties. [name] and this provider participated in today's telepsychological service.   This provider conducted a brief check-in and a risk assessment. [name] denied experiencing suicidal and homicidal ideation, plan, and intent since the last appointment with this provider. She reported she continues to have easy access to the developed safety plan, and continues to acknowledge understanding regarding the importance of reaching out to trusted individuals and/or emergency resources if she is unable to ensure safety.   [name] reported her "girlfriend is coming to lunch today." She shared she is excited to see her friend. [name] stated she is "under a lot of stress," adding she will be staying in her current home and her roommate will be moving. She also shared she is having more challenges with her breathing. Additionally, [name] stated she heard from her PCP. Currently, [name] stated she continues to take 50mg  of Zoloft daily, adding, "I'm fine" and feeling  "in more control." She stated she feels she is losing weight, adding her clothes feel a bit loose. However, she stated she is only eating one meal a day and "not following the diet." Notably, [name] discussed a reduction in emotional eating and she is focusing on making better choices and engaging in portion control. Reviewed pleasurable activities. She shared she continues to connect with her girlfriends. Notably, [name] continues to acknowledge current stressors, childhood events and decreased self-image appear to trigger current emotional eating. Thus, this provider again discussed the value of traditional therapeutic services. She was receptive to this placing a referral with [facility] again. [name] was receptive to today's appointment as evidenced by openness to sharing, responsiveness to feedback, and willingness to implement discussed strategies .  Mental Status Examination:  Appearance: well groomed and appropriate hygiene  Behavior: appropriate to circumstances Mood: anxious Affect: mood congruent Speech: normal in rate, volume, and tone Eye Contact: appropriate Psychomotor Activity: unable to assess  Gait: unable to assess Thought Process: linear, logical, and goal directed  Thought Content/Perception: denies suicidal and homicidal ideation, plan, and intent, no hallucinations, delusions, bizarre thinking or behavior reported or observed and denies ideation of and engagement in self-injurious behaviors Orientation: time, person, place, and purpose of appointment Memory/Concentration: memory, attention, language, and fund of knowledge intact  Insight/Judgment: fair  Interventions:  Conducted a brief chart review Conducted a risk assessment Provided empathic reflections and validation Reviewed content from the previous session Employed supportive psychotherapy interventions to facilitate reduced distress and to improve coping skills with identified  stressors Employed insight oriented and cognitive psychotherapy interventions to identify and modify anxiety/mood producing thoughts, beliefs, and negative self-appraisals contributing to distress and emotional eating Recommended/discussed option for longer-term therapeutic services  DSM-5 Diagnosis(es): 307.59 (F50.8) Other Specified Feeding or Eating Disorder, Emotional Eating Behaviors and 296.33 (F33.2) Major Depressive Disorder, Recurrent Episode, Severe, With Anxious Distress  Treatment Goal & Progress: During the initial appointment with this provider, the following treatment goal was established: increase coping skills. Stepahnie has demonstrated progress in her goal as evidenced by increased awareness of hunger patterns and increased awareness of triggers for emotional eating. [name] also continues to demonstrate willingness to engage in learned skill(s).  Plan: The next appointment will be scheduled in two weeks, which will be via MyChart Video Visit. The next session will focus on working towards the established treatment goal. Additionally, a referral was placed with [facility] again with [facility] verbal consent. She continues to speak with [name] (psychologist) as needed and agreed to continue taking Zoloft and attend her appointments with Dr. Jearld Shines.

## 2020-07-23 NOTE — Progress Notes (Signed)
Office: [phone]  /  Fax: [phone]    Date: August 06, 2020   Appointment Start Time: 12:30pm Duration: 37 minutes Provider: Glennie [name], Psy.D. Type of Session: Individual Therapy  Location of Patient: Home Location of Provider: Provider's home (private office) Type of Contact: Telepsychological Visit via MyChart Video Visit  Session Content: Rebecca [name] is a 70 y.o. female presenting for a follow-up appointment to address the previously established treatment goal of increasing coping skills. Today's appointment was a telepsychological visit due to COVID-19. Rebecca [name] provided verbal consent for today's telepsychological appointment and she is aware she is responsible for securing confidentiality on her end of the session. Prior to proceeding with today's appointment, Rebecca [name]'s physical location at the time of this appointment was obtained as well a phone number she could be reached at in the event of technical difficulties. Rebecca [name] and this provider participated in today's telepsychological service.   This provider conducted a brief check-in and a risk assessment was completed. Rebecca [name] denied experiencing suicidal and homicidal ideation, plan, and intent since the last appointment with this provider. She reported she continues to have easy access to the developed safety plan, and continues to acknowledge understanding regarding the importance of reaching out to trusted individuals and/or emergency resources if she is unable to ensure safety.    Rebecca [name] reported she is feeling "tired and exhausted," noting she is wondering if it is secondary to prednisone. She disclosed approximately eight years ago she was taking a high dose of prednisone resulting in her experiencing homicidal ideation toward her sister. She denied having plan or intent. She stated she informed her psychologist (Rebecca [name]) and he encouraged her to inform her PCP. Upon informing her PCP, Rebecca [name] stated she was  switched to another medication. Currently, she explained she is taking a "minimal" dose of prednisone and denied experiencing homicidal ideation ever again secondary to the medication. She was encouraged to discuss the aforementioned concerns this coming Friday with her PCP. Rebecca [name] agreed, adding she has a list of items she would like to discuss with her PCP. She further noted, "The number one thing I have to do is get my health under control." Positive reinforcement was provided. Moreover, Rebecca [name] continued to discuss ongoing familial stressors and concerns about housing. Discussed [address] as a resource for ongoing concerns related to housing. She was also receptive to discussing ALF options with her PCP.   Furthermore, Rebecca [name] continues to experience challenges with eating congruent to her meal plan. Rebecca [name] explained she has a "hard time fixing" foods, specifically protein. She was engaged in problem solving and food options (e.g., hard boiled eggs; [place] roll ups with cheese; cottage cheese) she could easily make/prepare were identified. Rebecca [name] was encouraged to think of ways to make/obtain foods in bulk to minimize cooking/prepping time. She was encouraged to discuss further with Dr. Jearld [name]; she agreed. Rebecca [name] reported a plan to take a "break" from the clinic; she was encouraged to speak further with Dr. Jearld [name] at their next appointment. She agreed. Overall, Rebecca [name] was receptive to today's appointment as evidenced by openness to sharing, responsiveness to feedback, and willingness to follow through with discussed plan.  Mental Status Examination:  Appearance: well groomed and appropriate hygiene  Behavior: appropriate to circumstances Mood: euthymic Affect: mood congruent Speech: normal in rate, volume, and tone Eye Contact: appropriate Psychomotor Activity: unable to assess  Gait: unable to assess Thought Process: linear, logical, and goal directed  Thought Content/Perception:  denies suicidal and homicidal ideation, plan, and intent, no hallucinations, delusions,  bizarre thinking or behavior reported or observed and denies ideation of and engagement in self-injurious behaviors Orientation: time, person, place, and purpose of appointment Memory/Concentration: memory, attention, language, and fund of knowledge intact  Insight/Judgment: fair  Interventions:  Conducted a brief chart review Conducted a risk assessment Provided empathic reflections and validation Provided positive reinforcement Employed supportive psychotherapy interventions to facilitate reduced distress and to improve coping skills with identified stressors Employed motivational interviewing skills to assess patient's willingness/desire to adhere to recommended medical treatments and assignments Engaged patient in problem solving  DSM-5 Diagnosis(es): F50.89 Other Specified Feeding or Eating Disorder, Emotional Eating Behaviors and F33.2 Major Depressive Disorder, Recurrent Episode, Severe, With Anxious Distress  Treatment Goal & Progress: During the initial appointment with this provider, the following treatment goal was established: increase coping skills. Rebecca [name] demonstrated progress in her goal as evidenced by increased awareness of hunger patterns and increased awareness of triggers for emotional eating. Rebecca [name] also continues to demonstrate willingness to engage in learned skill(s).  Plan: Rebecca [name] stated she would speak with Dr. Jearld [name] and then schedule a follow-up appointment with this provider if needed. She acknowledged understanding that she may request a follow-up appointment with this provider in the future as long as she is still established with the clinic. Rebecca [name] reported she continues to speak with her previous psychologist, Rebecca [name], as needed. Additionally, Rebecca [name] is scheduled to meet with her PCP on Friday, June 10th. Moreover, she stated she will call Rebecca [name] today after this appointment to schedule an initial appointment; phone number was provided. No further follow-up planned by this provider.

## 2020-07-24 ENCOUNTER — Other Ambulatory Visit: Payer: Self-pay | Admitting: Pulmonary Disease

## 2020-07-25 ENCOUNTER — Telehealth: Payer: Self-pay | Admitting: Pulmonary Disease

## 2020-07-25 NOTE — Telephone Encounter (Signed)
Spoke with patient. She verbalized understanding. She is adamant that she does not have covid but will get tested to be sure.   Nothing further needed at time of call.

## 2020-07-25 NOTE — Telephone Encounter (Signed)
She does need to get Covid tested now so we can tell if she needs the Paxlovid pills. Where do we refer them now?

## 2020-07-25 NOTE — Telephone Encounter (Signed)
Spoke with the pt  She is c/o increased SOB, wheezing, cough "wet" with clear sputum for the past few days  She states that she has some pain behind her left shoulder blade  She has been trying to take deep breaths to see if pain worse on inspiration, and this makes her cough but no worse pain Sats yesterday were in the mid 70's on 5-6 lpm o2 with exertion- better at rest and she has not checked this today  She can't check her temp- no thermometer  She says that she is taking pred 5 mg daily, azith 250 daily, trelegy, duoneb and ventolin  She has had covid vax x 3- last one 04/03/20 She states she fears going to the hospital or office "bc there are sick people there" and wearing mask makes her feel bad  Dr Annamaria Boots, please advise, thanks!  Allergies  Allergen Reactions  . Keflex [Cephalexin] Diarrhea  . Penicillins Nausea Only and Nausea And Vomiting    REACTION: nausea and vomiting REACTION: nausea and vomiting REACTION: nausea and vomiting   Current Outpatient Medications on File Prior to Visit  Medication Sig Dispense Refill  . albuterol (VENTOLIN HFA) 108 (90 Base) MCG/ACT inhaler INHALE 2 PUFFS INTO THE LUNGS EVERY 6 HOURS AS NEEDED FOR WHEEZING OR SHORTNESS OF BREATH 18 g 11  . ALPRAZolam (XANAX) 0.5 MG tablet TAKE ONE TABLET BY MOUTH THREE TIMES A DAY AS NEEDED FOR NERVES. MAXIMUM DAILY DOSE OF THREE TABLETS 90 tablet 0  . APPLE CIDER VINEGAR PO Take 1,000 mg by mouth daily.    . Ascorbic Acid (VITAMIN C) 1000 MG tablet Take 1,000 mg by mouth daily.    Marland Kitchen azithromycin (ZITHROMAX) 250 MG tablet TAKE ONE TABLET BY MOUTH DAILY 30 tablet 5  . cetirizine (ZYRTEC) 10 MG tablet Take 10 mg by mouth daily.    . Cholecalciferol (VITAMIN D-3) 125 MCG (5000 UT) TABS Take 1 tablet by mouth daily. Alternate 1k every other day with 2k 30 tablet   . CINNAMON PO Take 2,000 mg by mouth every morning. Takes 2 every am    . COLCRYS 0.6 MG tablet Take 1 tablet (0.6 mg total) by mouth 2 (two) times daily. 60  tablet 2  . furosemide (LASIX) 40 MG tablet TAKE ONE TO TWO TABLETS BY MOUTH EVERY DAY AS NEEDED FOR SWELLING 180 tablet 0  . glucosamine-chondroitin 500-400 MG tablet Take 2 tablets by mouth 2 (two) times daily.     . GUAIFENESIN ER PO Take 400 mg by mouth 2 (two) times daily. Tales 3 tab;es twice a day    . HYDROcodone-acetaminophen (NORCO) 5-325 MG tablet Take 1 tablet by mouth every 6 (six) hours as needed for moderate pain. 20 tablet 0  . ipratropium-albuterol (DUONEB) 0.5-2.5 (3) MG/3ML SOLN INHALE 1 VIAL VIA NEBULIZATION THREE TIMES A DAY 270 mL 5  . Magnesium 400 MG CAPS Take 3 capsules by mouth 2 (two) times daily.     . Misc Natural Products (TART CHERRY ADVANCED PO) Take by mouth.    Marland Kitchen OVER THE COUNTER MEDICATION CBD Oil    . OXYGEN Inhale 4 L into the lungs.    . predniSONE (DELTASONE) 10 MG tablet Take 1 tablet (10 mg total) by mouth daily with breakfast. 30 tablet 2  . predniSONE (DELTASONE) 5 MG tablet Take 1 tablet (5 mg total) by mouth daily with breakfast. 14 tablet 0  . sertraline (ZOLOFT) 100 MG tablet Take 1 tablet (100 mg total) by mouth  daily. 90 tablet 1  . sodium chloride HYPERTONIC 3 % nebulizer solution Take by nebulization 2 (two) times daily. 750 mL 12  . Spacer/Aero-Holding Chambers (AEROCHAMBER MV) inhaler Use as instructed 1 each 0  . traMADol (ULTRAM) 50 MG tablet Take 1 tablet (50 mg total) by mouth 3 (three) times daily as needed for moderate pain. 90 tablet 3  . TRELEGY ELLIPTA 100-62.5-25 MCG/INH AEPB INHALE ONE PUFF BY MOUTH DAILY 60 each 3  . TURMERIC PO Take 1,000 mg by mouth 2 (two) times daily. Takes 1,000 mg    . UNABLE TO FIND Med Name: Mullien OTC    . vitamin A 10000 UNIT capsule Take 5,000 Units by mouth daily. Per pt list - takes 25,000     No current facility-administered medications on file prior to visit.

## 2020-07-31 ENCOUNTER — Other Ambulatory Visit: Payer: Self-pay | Admitting: Internal Medicine

## 2020-07-31 DIAGNOSIS — J9610 Chronic respiratory failure, unspecified whether with hypoxia or hypercapnia: Secondary | ICD-10-CM

## 2020-08-02 DIAGNOSIS — J449 Chronic obstructive pulmonary disease, unspecified: Secondary | ICD-10-CM | POA: Diagnosis not present

## 2020-08-05 ENCOUNTER — Other Ambulatory Visit: Payer: Self-pay | Admitting: Internal Medicine

## 2020-08-05 DIAGNOSIS — F419 Anxiety disorder, unspecified: Secondary | ICD-10-CM

## 2020-08-06 ENCOUNTER — Telehealth (INDEPENDENT_AMBULATORY_CARE_PROVIDER_SITE_OTHER): Payer: Medicare HMO | Admitting: Psychology

## 2020-08-06 DIAGNOSIS — F5089 Other specified eating disorder: Secondary | ICD-10-CM | POA: Diagnosis not present

## 2020-08-06 DIAGNOSIS — F332 Major depressive disorder, recurrent severe without psychotic features: Secondary | ICD-10-CM

## 2020-08-08 ENCOUNTER — Telehealth: Payer: Self-pay | Admitting: Pulmonary Disease

## 2020-08-08 MED ORDER — TRELEGY ELLIPTA 100-62.5-25 MCG/INH IN AEPB: 1.0000 | INHALATION_SPRAY | Freq: Every day | RESPIRATORY_TRACT | 0 refills | Status: DC

## 2020-08-08 NOTE — Telephone Encounter (Signed)
1 sample trelegy 100 up front for pick up  [facility] letting pt know ok per [facility]

## 2020-08-09 ENCOUNTER — Other Ambulatory Visit: Payer: Self-pay

## 2020-08-09 ENCOUNTER — Ambulatory Visit (INDEPENDENT_AMBULATORY_CARE_PROVIDER_SITE_OTHER): Payer: Medicare HMO | Admitting: Internal Medicine

## 2020-08-09 ENCOUNTER — Telehealth: Payer: Self-pay | Admitting: Pulmonary Disease

## 2020-08-09 ENCOUNTER — Encounter: Payer: Self-pay | Admitting: Internal Medicine

## 2020-08-09 DIAGNOSIS — J441 Chronic obstructive pulmonary disease with (acute) exacerbation: Secondary | ICD-10-CM

## 2020-08-09 MED ORDER — BENZONATATE 100 MG PO CAPS: 100.0000 mg | ORAL_CAPSULE | Freq: Two times a day (BID) | ORAL | 0 refills | Status: DC | PRN

## 2020-08-09 MED ORDER — METHYLPREDNISOLONE 4 MG PO TBPK: ORAL_TABLET | ORAL | 21 refills | Status: DC

## 2020-08-09 NOTE — Telephone Encounter (Signed)
Called and spoke with Patient.  Dr. Vaughan Browner recommendations given.  Understanding stated.  Patient scheduled follow up appointment 09/27/20 at 4, with Dr. Vaughan Browner.

## 2020-08-09 NOTE — Telephone Encounter (Signed)
Called and spoke with Patient.  Patient stated she has been having increased sob with exertion. Patient stated she is feeling like her lungs are full of fluid, and that is making her feel like she can't catch her breath. Patient stated last week her sats were in the 70's on RA.  Patient stated she used 2-6L O2, but if she is sitting around she will take O2 off. Patient stated today she had a virtual visit with Dr. Jerilee Hoh.  Dr. Jerilee Hoh prescribed Tessalon and medrol today, for COPD exacerbation, that Patient is going to pick up today. Patient stated her sats were 94% 2L O2.  Patient stated she is using nebs BID, but felt like they did not help. Mucinex 3 tabs in the morning and 3 tabs in the evening to help with congestion.  Patient stated she had ran out of Trelegy, because her pharmacy will not have in stock until Monday.  Patient stated she had been without Trelegy for 3 days. Patient has sample she has not pick up at front desk. Advised Patient to continue Trelegy and pick up sample.  Patient requested further recommendations from Dr. Vaughan Browner. Patient stated it was ok to leave recommendations on VM, if she does not answer.  Message routed to Dr. Vaughan Browner to advised

## 2020-08-09 NOTE — Progress Notes (Signed)
Virtual Visit via Video Note  I connected with Rebecca [name] on 08/09/20 at  2:00 PM EDT by a video enabled telemedicine application and verified that I am speaking with the correct person using two identifiers.  Location patient: home Location provider: work office Persons participating in the virtual visit: patient, provider  I discussed the limitations of evaluation and management by telemedicine and the availability of in person appointments. The patient expressed understanding and agreed to proceed.   HPI: She has scheduled this visit to discuss which she describes as "lung problems".  She has gold stage IV COPD who is supposed to be on 5 L of baseline oxygen.  Over the past few days she has had to increase her oxygen to 6 to 8 L.  Last night she found her O2 sat in the 70s.  I have asked her to place her pulse oximeter while we are having this video conversation and it was 82%.  It subsequently rose to 94% while wearing her baseline oxygen.  She feels "like I am drowning at night".  She has had an increase in sputum production that she describes as clear.  She took a home COVID test that was negative.  She has a dry cough and has been coughing intermittently throughout this video consultation.  She has been taking azithromycin daily per her pulmonologist's request.  She is steroid-dependent and has been taking 5 mg of prednisone daily.  She is requesting a steroid taper.   ROS: Constitutional: Denies fever, chills, diaphoresis, appetite change. HEENT: Denies photophobia, eye pain, redness, hearing loss, ear pain, congestion, sore throat, rhinorrhea, sneezing, mouth sores, trouble swallowing, neck pain, neck stiffness and tinnitus.   Respiratory: Positive for SOB, DOE, cough, chest tightness,  and wheezing.   Cardiovascular: Denies chest pain, palpitations and leg swelling.  Gastrointestinal: Denies nausea, vomiting, abdominal pain, diarrhea, constipation, blood in stool and  abdominal distention.  Genitourinary: Denies dysuria, urgency, frequency, hematuria, flank pain and difficulty urinating.  Endocrine: Denies: hot or cold intolerance, sweats, changes in hair or nails, polyuria, polydipsia. Musculoskeletal: Denies myalgias, back pain, joint swelling, arthralgias and gait problem.  Skin: Denies pallor, rash and wound.  Neurological: Denies dizziness, seizures, syncope, weakness, light-headedness, numbness and headaches.  Hematological: Denies adenopathy. Easy bruising, personal or family bleeding history  Psychiatric/Behavioral: Denies suicidal ideation, mood changes, confusion, nervousness, sleep disturbance and agitation   Past Medical History:  Diagnosis Date   Anxiety    Asthma    Back pain    Bilateral swelling of feet    Chest pain    Chest pain    Chewing difficulty    Chronic venous insufficiency    COPD (chronic obstructive pulmonary disease) ([name])    Depression    DJD (degenerative joint disease)    Drug use    Dysthymia    Emphysema/COPD ([name])    Fatty liver    Fibromyalgia    Gallbladder problem    GERD (gastroesophageal reflux disease)    Gout    History of headache    Hodgkin lymphoma of extranodal or solid organ site [facility])    Hypothyroid    IBS (irritable bowel syndrome)    Infertility, female    Joint pain    Kidney problem    Metabolic syndrome X    Morbid obesity ([name])    OSA (obstructive sleep apnea)    Osteoarthritis    SOB (shortness of breath)    Swallowing difficulty    Vitamin  B12 deficiency    Vitamin D deficiency     Past Surgical History:  Procedure Laterality Date   APPENDECTOMY     BILATERAL SALPINGOOPHORECTOMY  2007   [facility]   CHOLECYSTECTOMY  1982   LAPAROSCOPIC GASTRIC BANDING  08/2007   Dr. 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 Her smoking use included cigarettes. She started smoking about 58 years ago. She has a 29.00 pack-year smoking history. She has never used smokeless tobacco. She reports that she does not drink alcohol and does not use drugs.   Current Outpatient Medications:    albuterol (VENTOLIN HFA) 108 (90 Base) MCG/ACT inhaler, INHALE 2 PUFFS INTO THE LUNGS EVERY 6 HOURS AS NEEDED FOR WHEEZING OR SHORTNESS OF BREATH, Disp: 18 g, Rfl: 11   ALPRAZolam (XANAX) 0.5 MG tablet, TAKE ONE TABLET BY MOUTH THREE TIMES A DAY AS NEEDED FOR NERVES  --- MAXIMUM 3 TABLETS ---, Disp: 90 tablet, Rfl: 0   APPLE CIDER VINEGAR PO, Take 1,000 mg by mouth daily., Disp: , Rfl:    Ascorbic Acid (VITAMIN C) 1000 MG tablet, Take 1,000 mg by mouth daily., Disp: , Rfl:    azithromycin (ZITHROMAX) 250 MG tablet, TAKE ONE TABLET BY MOUTH DAILY, Disp: 30 tablet, Rfl: 5   benzonatate (TESSALON) 100 MG capsule, Take 1 capsule (100 mg total) by mouth 2 (two) times daily as needed for cough., Disp: 20 capsule, Rfl: 0   cetirizine (ZYRTEC) 10 MG tablet, Take 10 mg by mouth daily., Disp: , Rfl:    Cholecalciferol (VITAMIN D-3) 125 MCG (5000 UT) TABS, Take 1 tablet by mouth daily. Alternate 1k every other day with 2k, Disp: 30 tablet, Rfl:    CINNAMON PO, Take 2,000 mg by mouth every morning. Takes 2 every am, Disp: , Rfl:    COLCRYS 0.6 MG tablet, Take 1 tablet (0.6 mg total) by mouth 2 (two) times daily., Disp: 60 tablet, Rfl: 2   Fluticasone-Umeclidin-Vilant (TRELEGY ELLIPTA) 100-62.5-25 MCG/INH AEPB, Inhale 1 puff into the lungs daily., Disp: 14 each, Rfl: 0   furosemide (LASIX) 40 MG tablet, TAKE ONE TO TWO TABLETS BY MOUTH DAILY AS NEEDED FOR SWELLING, Disp: 180 tablet, Rfl: 1   glucosamine-chondroitin 500-400  MG tablet, Take 2 tablets by mouth 2 (two) times daily. , Disp: , Rfl:    GUAIFENESIN ER PO, Take 400 mg by mouth 2 (two) times daily. Tales 3 tab;es twice a day, Disp: , Rfl:    HYDROcodone-acetaminophen (NORCO) 5-325 MG tablet, Take 1 tablet by mouth every 6 (six) hours as needed for moderate pain., Disp: 20 tablet, Rfl: 0   ipratropium-albuterol (DUONEB) 0.5-2.5 (3) MG/3ML SOLN, INHALE 1 VIAL VIA NEBULIZATION THREE TIMES A DAY, Disp: 270 mL, Rfl: 5   Magnesium 400 MG CAPS, Take 3 capsules by mouth 2 (two) times daily. , Disp: , Rfl:    methylPREDNISolone (MEDROL DOSEPAK) 4 MG TBPK tablet, Take as directed, Disp: 21 tablet, Rfl: 21  Misc Natural Products (TART CHERRY ADVANCED PO), Take by mouth., Disp: , Rfl:    OVER THE COUNTER MEDICATION, CBD Oil, Disp: , Rfl:    OXYGEN, Inhale 4 L into the lungs., Disp: , Rfl:    predniSONE (DELTASONE) 10 MG tablet, Take 1 tablet (10 mg total) by mouth daily with breakfast., Disp: 30 tablet, Rfl: 2   predniSONE (DELTASONE) 5 MG tablet, Take 1 tablet (5 mg total) by mouth daily with breakfast., Disp: 14 tablet, Rfl: 0   sertraline (ZOLOFT) 100 MG tablet, Take 1 tablet (100 mg total) by mouth daily., Disp: 90 tablet, Rfl: 1   sodium chloride HYPERTONIC 3 % nebulizer solution, Take by nebulization 2 (two) times daily., Disp: 750 mL, Rfl: 12   Spacer/Aero-Holding Chambers (AEROCHAMBER MV) inhaler, Use as instructed, Disp: 1 each, Rfl: 0   traMADol (ULTRAM) 50 MG tablet, Take 1 tablet (50 mg total) by mouth 3 (three) times daily as needed for moderate pain., Disp: 90 tablet, Rfl: 3   TRELEGY ELLIPTA 100-62.5-25 MCG/INH AEPB, INHALE ONE PUFF BY MOUTH DAILY, Disp: 60 each, Rfl: 3   TURMERIC PO, Take 1,000 mg by mouth 2 (two) times daily. Takes 1,000 mg, Disp: , Rfl:    UNABLE TO FIND, Med Name: Mullien OTC, Disp: , Rfl:    vitamin A 10000 UNIT capsule, Take 5,000 Units by mouth daily. Per pt list - takes 25,000, Disp: , Rfl:   EXAM:   VITALS per patient if  applicable: O2 sat of 13% on room air rising to 94% on her baseline 5 L of oxygen.  GENERAL: alert, oriented, appears short of breath and speaks in short sentences  HEENT: atraumatic, conjunttiva clear, no obvious abnormalities on inspection of external nose and ears  NECK: normal movements of the head and neck  LUNGS: on visual inspection breathing rate appears increased with increased work of breathing. CV: no obvious cyanosis  MS: moves all visible extremities without noticeable abnormality  PSYCH/NEURO: pleasant and cooperative, no obvious depression or anxiety, speech and thought processing grossly intact  ASSESSMENT AND PLAN:   COPD with acute exacerbation ([place])  -I am certainly concerned about her symptoms.  She definitely has a COPD exacerbation, unclear if an ongoing infection.  She did have a negative COVID test. -I have advised ED evaluation but as usual she is reluctant. -I will send a Medrol Dosepak for her to take on top of her baseline 5 mg of prednisone.  She is already on daily antibiotic therapy. -I have asked her to call her pulmonology office for a sooner appointment. -I have instructed her that my advice remains for her to go to the emergency department today or this weekend if she fails to improve.  Time spent: 30 minutes discussing with patient, formulating plan of care and reviewing chart     I discussed the assessment and treatment plan with the patient. The patient was provided an opportunity to ask questions and all were answered. The patient agreed with the plan and demonstrated an understanding of the instructions.   The patient was advised to call back or seek an in-person evaluation if the symptoms worsen or if the condition fails to improve as anticipated.    Lelon Frohlich, MD   Primary Care at [facility]

## 2020-08-09 NOTE — Telephone Encounter (Signed)
I think the recommendations are appropriate.  Continue steroids Encouraged her to restart trelegy as soon as possible

## 2020-08-10 ENCOUNTER — Encounter (INDEPENDENT_AMBULATORY_CARE_PROVIDER_SITE_OTHER): Payer: Self-pay

## 2020-08-12 ENCOUNTER — Ambulatory Visit (INDEPENDENT_AMBULATORY_CARE_PROVIDER_SITE_OTHER): Payer: Medicare HMO | Admitting: Family Medicine

## 2020-08-12 ENCOUNTER — Other Ambulatory Visit: Payer: Self-pay | Admitting: Internal Medicine

## 2020-08-12 DIAGNOSIS — M1A9XX0 Chronic gout, unspecified, without tophus (tophi): Secondary | ICD-10-CM

## 2020-08-15 ENCOUNTER — Telehealth: Payer: Self-pay | Admitting: *Deleted

## 2020-08-15 NOTE — Chronic Care Management (AMB) (Signed)
  Chronic Care Management   Outreach Note  08/15/2020 Name: Rebecca [name] MRN: [id number] DOB: August 17, 1950  Rebecca [name] is a 71 y.o. year old female who is a primary care patient of Rebecca [name], Rebecca Halsted, MD. I reached out to Rebecca [name] by phone today in response to a referral sent by Rebecca [name]'s PCP Rebecca [name], Rebecca Halsted, MD     An unsuccessful telephone outreach was attempted today. The patient was referred to the case management team for assistance with care management and care coordination.   Follow Up Plan: A HIPAA compliant phone message was left for the patient providing contact information and requesting a return call.  If patient returns call to provider office, please advise to call Embedded Care Management Care Guide Naliya Gish at [place] [place] [facility]  Direct Dial: [phone]

## 2020-08-16 ENCOUNTER — Encounter: Payer: Self-pay | Admitting: Family Medicine

## 2020-08-16 ENCOUNTER — Ambulatory Visit (INDEPENDENT_AMBULATORY_CARE_PROVIDER_SITE_OTHER): Payer: Medicare HMO | Admitting: Family Medicine

## 2020-08-16 ENCOUNTER — Telehealth: Payer: Self-pay

## 2020-08-16 ENCOUNTER — Other Ambulatory Visit: Payer: Self-pay

## 2020-08-16 VITALS — BP 144/78 | HR 102 | Temp 98.7°F | Wt 224.6 lb

## 2020-08-16 DIAGNOSIS — L03114 Cellulitis of left upper limb: Secondary | ICD-10-CM

## 2020-08-16 MED ORDER — DOXYCYCLINE HYCLATE 100 MG PO TABS
100.0000 mg | ORAL_TABLET | Freq: Two times a day (BID) | ORAL | 0 refills | Status: AC
Start: 2020-08-16 — End: 2020-08-23

## 2020-08-16 MED ORDER — COLCHICINE 0.6 MG PO TABS: 0.6000 mg | ORAL_TABLET | Freq: Every day | ORAL | 0 refills | Status: AC

## 2020-08-16 NOTE — Patient Instructions (Signed)
Discussed gout flare versus cellulitis given small area that may be an insect bite.

## 2020-08-16 NOTE — Progress Notes (Signed)
Subjective:    Patient ID: Rebecca [name], female    DOB: [birth year], 70 y.o.   MRN: [id number]  Chief Complaint  Patient presents with   Insect Bite    Was bit on the left hand on Monday, has been red and swollen since also weak. Has been taking aleve because it has been the only thing to help, but does not want to take it.     HPI Patient was seen today for acute concern.  Pt was bit on L hand by an insect 5 days ago.  Notes hand is red, swollen, hot, and painful.  Patterson Hammersmith for symptoms.  Wearing a brace.  Denies fever, chills, n/v.  Was given a medrol dose pack 6/10 for COPD exacerbation and on daily Azithromycin.  Pt has a h/o gout.  Past Medical History:  Diagnosis Date   Anxiety    Asthma    Back pain    Bilateral swelling of feet    Chest pain    Chest pain    Chewing difficulty    Chronic venous insufficiency    COPD (chronic obstructive pulmonary disease) ([name])    Depression    DJD (degenerative joint disease)    Drug use    Dysthymia    Emphysema/COPD ([name])    Fatty liver    Fibromyalgia    Gallbladder problem    GERD (gastroesophageal reflux disease)    Gout    History of headache    Hodgkin lymphoma of extranodal or solid organ site ([name])    Hypothyroid    IBS (irritable bowel syndrome)    Infertility, female    Joint pain    Kidney problem    Metabolic syndrome X    Morbid obesity ([name])    OSA (obstructive sleep apnea)    Osteoarthritis    SOB (shortness of breath)    Swallowing difficulty    Vitamin B12 deficiency    Vitamin D deficiency     Allergies  Allergen Reactions   Keflex [Cephalexin] Diarrhea   Penicillins Nausea Only and Nausea And Vomiting    REACTION: nausea and vomiting REACTION: nausea and vomiting REACTION: nausea and vomiting    ROS General: Denies fever, chills, night sweats, changes in weight, changes in appetite HEENT: Denies headaches, ear pain, changes in vision, rhinorrhea, sore throat CV: Denies CP,  palpitations, SOB, orthopnea Pulm: Denies SOB, cough, wheezing GI: Denies abdominal pain, nausea, vomiting, diarrhea, constipation GU: Denies dysuria, hematuria, frequency, vaginal discharge Msk: Denies muscle cramps, joint pains  +L hand /wrist pain, edema, warmth Neuro: Denies weakness, numbness, tingling Skin: Denies rashes, bruising Psych: Denies depression, anxiety, hallucinations    Objective:    Blood pressure (!) 144/78, pulse (!) 102, temperature 98.7 F (37.1 C), temperature source Oral, weight 224 lb 9.6 oz (101.9 kg), SpO2 (!) 89 %.  Gen. Pleasant, well-nourished, in no distress, normal affect,  On 3L O2 via [place]. HEENT: [place]/AT, face symmetric, conjunctiva clear, no scleral icterus, PERRLA, EOMI, nares patent without drainage Lungs: no accessory muscle use, CTAB, no wheezes or rales Cardiovascular: tachycardia, no m/r/g, no peripheral edema Musculoskeletal: L lateral wrist with erythema, edema, increased warmth and TTP.  No deformities, no cyanosis or clubbing, normal tone Neuro:  A&Ox3, CN II-XII intact, normal gait Skin:  Warm, dry, intact.  L wrist with erythema.  Questionable area of insect bite.  Marked with skin marker.      Wt Readings from Last 3 Encounters:  08/16/20 224 lb 9.6  oz (101.9 kg)  06/12/20 252 lb (114.3 kg)  05/21/20 246 lb (111.6 kg)    Lab Results  Component Value Date   WBC 14.2 (H) 05/07/2020   HGB 14.9 05/07/2020   HCT 47.4 (H) 05/07/2020   PLT 303 05/07/2020   GLUCOSE 85 05/07/2020   CHOL 200 (H) 05/07/2020   TRIG 135 05/07/2020   HDL 43 05/07/2020   LDLCALC 133 (H) 05/07/2020   ALT 24 05/07/2020   AST 32 05/07/2020   NA 142 05/07/2020   K 5.2 05/07/2020   CL 102 05/07/2020   CREATININE 1.37 (H) 05/07/2020   BUN 24 05/07/2020   CO2 25 05/07/2020   TSH 1.880 05/07/2020   HGBA1C 6.1 (H) 05/07/2020    Assessment/Plan:  Cellulitis of left upper extremity  -skin marker used to mark area or erythema - Plan: doxycycline  (VIBRA-TABS) 100 MG tablet  Discussed possible causes of edema and pain in left hand including cellulitis, gout.  Discussed recent start of Medrol Dosepak would have helped for gout flare.  Given chronic use of azithromycin.  We will start doxycycline for the next 7 days for cellulitis.  Patient can restart azithromycin daily after finishing doxycycline course.  Given strict precautions.  Given handout.  F/u as needed  Grier Mitts, MD

## 2020-08-16 NOTE — Telephone Encounter (Signed)
Pt came into the office to see another provider. Pt told CMA that she was not able to get Colcrys she needed generic. Called pharmacy and they stated pt needs generic due to insurance changes.  Rx changed to generic. Routing to PCP as FYI. Pt notified of update.

## 2020-08-19 ENCOUNTER — Telehealth (INDEPENDENT_AMBULATORY_CARE_PROVIDER_SITE_OTHER): Payer: Self-pay | Admitting: Psychology

## 2020-08-19 NOTE — Telephone Encounter (Signed)
  Office: [phone]  /  Fax: [phone]  Date of Call: August 19, 2020  Time of Call: 1:18pm Duration of Call: ~ 12 minutes Provider: Glennie Isle, PsyD  CONTENT: Rebecca [name] called this provider to give an update. She explained she had a virtual visit with her PCP after the last appointment with this provider. Rebecca [name] explained she was also seen by another provider due to another medical concern, adding it was an in-person visit and she learned she lost 30 pounds. She was encouraged to share the update with Rebecca [name] as well. Rebecca [name] stated she was unable to meet with Rebecca [name] for their recent appointment as Rebecca [name] was out of the office. She was encouraged to call the clinic to reschedule the appointment. Rebecca [name] was receptive, but noted she still is planning to take a break from the clinic. This provider recommended she continue to meet with her primary therapist, Rebecca [name]. She agreed, noting they talked yesterday. A risk assessment was conducted. Rebecca [name] denied suicidal, homicidal, and self-injurious ideation, plan, and intent. She added, "I don't want to go down in eternity as someone who killed herself." All questions/concerns addressed.   PLAN:  No further follow-up planned by this provider.

## 2020-08-22 ENCOUNTER — Other Ambulatory Visit: Payer: Self-pay | Admitting: Internal Medicine

## 2020-08-22 DIAGNOSIS — M1A9XX0 Chronic gout, unspecified, without tophus (tophi): Secondary | ICD-10-CM

## 2020-08-26 NOTE — Chronic Care Management (AMB) (Signed)
  Chronic Care Management   Note  08/26/2020 Name: Buffey Zabinski MRN: [id number] DOB: February 23, 1951  Kyonna Frier Tebbetts is a 70 y.o. year old female who is a primary care patient of Isaac Bliss, Rayford Halsted, MD. I reached out to Carlyon Shadow by phone today in response to a referral sent by Ms. Erin Sons Higinbotham's PCP Isaac Bliss, Rayford Halsted, MD     Ms. Kalka was given information about Chronic Care Management services today including:  CCM service includes personalized support from designated clinical staff supervised by her physician, including individualized plan of care and coordination with other care providers 24/7 contact phone numbers for assistance for urgent and routine care needs. Service will only be billed when office clinical staff spend 20 minutes or more in a month to coordinate care. Only one practitioner may furnish and bill the service in a calendar month. The patient may stop CCM services at any time (effective at the end of the month) by phone call to the office staff. The patient will be responsible for cost sharing (co-pay) of up to 20% of the service fee (after annual deductible is met).  Patient agreed to services and verbal consent obtained.   Follow up plan: Telephone appointment with care management team member scheduled for:09/09/2020  Julian Hy, [place] [facility]  Direct Dial: [phone]

## 2020-08-26 NOTE — Chronic Care Management (AMB) (Signed)
  Chronic Care Management   Outreach Note  08/26/2020 Name: [name] MRN: [id number] DOB: 11-10-1950  Rebecca [name] is a 69 y.o. year old female who is a primary care patient of Isaac Bliss, Rayford Halsted, MD. I reached out to Carlyon Shadow by phone today in response to a referral sent by Ms. Erin Sons Helbing's PCP Isaac Bliss, Rayford Halsted, MD     A second unsuccessful telephone outreach was attempted today. The patient was referred to the case management team for assistance with care management and care coordination.   Follow Up Plan: A HIPAA compliant phone message was left for the patient providing contact information and requesting a return call.  If patient returns call to provider office, please advise to call Embedded Care Management Care Guide Kwynn Schlotter at [place], [place] [facility]  Direct Dial: [phone]

## 2020-09-01 DIAGNOSIS — J449 Chronic obstructive pulmonary disease, unspecified: Secondary | ICD-10-CM | POA: Diagnosis not present

## 2020-09-04 ENCOUNTER — Telehealth: Payer: Self-pay | Admitting: Pulmonary Disease

## 2020-09-04 NOTE — Telephone Encounter (Signed)
Has the patient tested for Covid? She needs to do a home Covid test. She needs to seek emergency care for saturations that drop below 88%.  She needs a CXR to determine if there is something else going on.  If her Covid test is negative, she can come here for a CXR only, ( We do not have any openings for appointments) and we will call her with the results. We can determine if we can send in prednisone based on the results of the CXR. ( She must Covid test before coming in to the office). Other option is Urgent Care or ED.  Thanks

## 2020-09-04 NOTE — Telephone Encounter (Signed)
Pt is having difficulty breathing. Pt states her oxygen sat is dropping on oxygen-ra sat dropped to 74%. Pt doesn't feel her oxygen is helping, because she can't get a breath in. Not eating well because she doesn't have enough "air" to get to the kitchen. Please advise. [phone]

## 2020-09-04 NOTE — Telephone Encounter (Signed)
Patient called back and I went over Rebecca [name] with patient. She is leaning more to going to ED in [facility] as she cannot afford a covid test right now. I informed patient to let us know if she does indeed take a test and if negative we can get her in for an CXR. Patient verbalized understanding, nothing further needed.

## 2020-09-04 NOTE — Telephone Encounter (Signed)
ATC, left VM for callback. 

## 2020-09-04 NOTE — Telephone Encounter (Signed)
Spoke with pt who states nose is very dry and "scaby" and pt feels it hard to inhale O2 through nose and therefore she feels like she isn't getting enough O2. Pt states using every OTC nasal spray made but nothing has helped. Pt states current O2 is 90% on 2L. Pt claims O2 has dropped down to 70% on 6L of O2 before but she is able to sit still and then saturation will recover to high 90's. Pt states she can not use a humidifier bottle on her because several times in the past water has leaked into nasal canal. Pt instructed if her O2 saturation fell below 88% she needed seek emergency medical evalation. Pt states she does not want to the hospital. Pt completed a steroid taper in beginning of May which pt said helped. Judson Roch could you please advise as Dr. Vaughan Browner is currently out of office.

## 2020-09-04 NOTE — Telephone Encounter (Signed)
Called patient but she did not answer. Left message for her to call back.  

## 2020-09-09 ENCOUNTER — Ambulatory Visit (INDEPENDENT_AMBULATORY_CARE_PROVIDER_SITE_OTHER): Payer: Medicare HMO | Admitting: *Deleted

## 2020-09-09 ENCOUNTER — Other Ambulatory Visit: Payer: Self-pay | Admitting: Pulmonary Disease

## 2020-09-09 DIAGNOSIS — F332 Major depressive disorder, recurrent severe without psychotic features: Secondary | ICD-10-CM

## 2020-09-09 DIAGNOSIS — M170 Bilateral primary osteoarthritis of knee: Secondary | ICD-10-CM | POA: Diagnosis not present

## 2020-09-09 DIAGNOSIS — M8949 Other hypertrophic osteoarthropathy, multiple sites: Secondary | ICD-10-CM | POA: Diagnosis not present

## 2020-09-09 DIAGNOSIS — M4722 Other spondylosis with radiculopathy, cervical region: Secondary | ICD-10-CM | POA: Diagnosis not present

## 2020-09-09 DIAGNOSIS — J449 Chronic obstructive pulmonary disease, unspecified: Secondary | ICD-10-CM

## 2020-09-09 DIAGNOSIS — J441 Chronic obstructive pulmonary disease with (acute) exacerbation: Secondary | ICD-10-CM

## 2020-09-09 DIAGNOSIS — N1832 Chronic kidney disease, stage 3b: Secondary | ICD-10-CM

## 2020-09-09 DIAGNOSIS — M159 Polyosteoarthritis, unspecified: Secondary | ICD-10-CM

## 2020-09-10 NOTE — Chronic Care Management (AMB) (Signed)
Chronic Care Management    Clinical Social Work Note  09/10/2020 Name: Rebecca [name] MRN: [id number] DOB: 05-14-1950  Rebecca [name] is a 70 y.o. year old female who is a primary care patient of Isaac Bliss, Rayford Halsted, MD. The CCM team was consulted to assist the patient with chronic disease management and/or care coordination needs related to: [employer] and Level of Care Concerns.   Engaged with patient by telephone for initial visit in response to provider referral for social work chronic care management and care coordination services.   Consent to Services:  The patient was given information about Chronic Care Management services, agreed to services, and gave verbal consent prior to initiation of services.  Please see initial visit note for detailed documentation.   Patient agreed to services and consent obtained.   Assessment: Review of patient past medical history, allergies, medications, and health status, including review of relevant consultants reports was performed today as part of a comprehensive evaluation and provision of chronic care management and care coordination services.     SDOH (Social Determinants of Health) assessments and interventions performed:  SDOH Interventions    Flowsheet Row Most Recent Value  SDOH Interventions   Food Insecurity Interventions Intervention Not Indicated  Financial Strain Interventions Intervention Not Indicated  Housing Interventions Intervention Not Indicated  Intimate Partner Violence Interventions Intervention Not Indicated  Physical Activity Interventions Intervention Not Indicated, Patient Refused  Stress Interventions Intervention Not Indicated, Offered Community Wellness Resources  Social Connections Interventions Intervention Not Indicated, Patient Refused  Transportation Interventions Intervention Not Indicated  Depression Interventions/Treatment  Medication, Patient refuses Treatment        Advanced  Directives Status: See Care Plan for related entries.  CCM Care Plan  Allergies  Allergen Reactions   Keflex [Cephalexin] Diarrhea   Penicillins Nausea Only and Nausea And Vomiting    REACTION: nausea and vomiting REACTION: nausea and vomiting REACTION: nausea and vomiting    Outpatient Encounter Medications as of 09/09/2020  Medication Sig   albuterol (VENTOLIN HFA) 108 (90 Base) MCG/ACT inhaler INHALE TWO PUFFS INTO THE LUNGS EVERY 6 HOURS AS NEEDED FOR WHEEZING OR SHORTNESS OF BREATH   ALPRAZolam (XANAX) 0.5 MG tablet TAKE ONE TABLET BY MOUTH THREE TIMES A DAY AS NEEDED FOR NERVES  --- MAXIMUM 3 TABLETS ---   APPLE CIDER VINEGAR PO Take 1,000 mg by mouth daily.   Ascorbic Acid (VITAMIN C) 1000 MG tablet Take 1,000 mg by mouth daily.   azithromycin (ZITHROMAX) 250 MG tablet TAKE ONE TABLET BY MOUTH DAILY   benzonatate (TESSALON) 100 MG capsule Take 1 capsule (100 mg total) by mouth 2 (two) times daily as needed for cough.   cetirizine (ZYRTEC) 10 MG tablet Take 10 mg by mouth daily.   Cholecalciferol (VITAMIN D-3) 125 MCG (5000 UT) TABS Take 1 tablet by mouth daily. Alternate 1k every other day with 2k   CINNAMON PO Take 2,000 mg by mouth every morning. Takes 2 every am   colchicine 0.6 MG tablet Take 1 tablet (0.6 mg total) by mouth daily.   Fluticasone-Umeclidin-Vilant (TRELEGY ELLIPTA) 100-62.5-25 MCG/INH AEPB Inhale 1 puff into the lungs daily.   furosemide (LASIX) 40 MG tablet TAKE ONE TO TWO TABLETS BY MOUTH DAILY AS NEEDED FOR SWELLING   glucosamine-chondroitin 500-400 MG tablet Take 2 tablets by mouth 2 (two) times daily.    GUAIFENESIN ER PO Take 400 mg by mouth 2 (two) times daily. Tales 3 tab;es twice a day   HYDROcodone-acetaminophen ([place])  5-325 MG tablet Take 1 tablet by mouth every 6 (six) hours as needed for moderate pain.   ipratropium-albuterol (DUONEB) 0.5-2.5 (3) MG/3ML SOLN INHALE 1 VIAL VIA NEBULIZATION THREE TIMES A DAY   Magnesium 400 MG CAPS Take 3 capsules  by mouth 2 (two) times daily.    methylPREDNISolone (MEDROL DOSEPAK) 4 MG TBPK tablet Take as directed   Misc Natural Products (TART CHERRY ADVANCED PO) Take by mouth.   MITIGARE 0.6 MG CAPS TAKE ONE CAPSULE BY MOUTH TWICE A DAY   OVER THE COUNTER MEDICATION CBD Oil   OXYGEN Inhale 4 L into the lungs.   predniSONE (DELTASONE) 10 MG tablet Take 1 tablet (10 mg total) by mouth daily with breakfast.   predniSONE (DELTASONE) 5 MG tablet Take 1 tablet (5 mg total) by mouth daily with breakfast.   sertraline (ZOLOFT) 100 MG tablet Take 1 tablet (100 mg total) by mouth daily.   sodium chloride HYPERTONIC 3 % nebulizer solution Take by nebulization 2 (two) times daily.   Spacer/Aero-Holding Chambers (AEROCHAMBER MV) inhaler Use as instructed   traMADol (ULTRAM) 50 MG tablet Take 1 tablet (50 mg total) by mouth 3 (three) times daily as needed for moderate pain.   TRELEGY ELLIPTA 100-62.5-25 MCG/INH AEPB INHALE ONE PUFF BY MOUTH DAILY   TURMERIC PO Take 1,000 mg by mouth 2 (two) times daily. Takes 1,000 mg   UNABLE TO FIND Med Name: [place] OTC   vitamin A 10000 UNIT capsule Take 5,000 Units by mouth daily. Per pt list - takes 25,000   No facility-administered encounter medications on file as of 09/09/2020.    Patient Active Problem List   Diagnosis Date Noted   Bradycardia with 31-40 beats per minute 01/04/2020   Takes dietary supplements 04/26/2018   DNR (do not resuscitate) 04/05/2018   CKD (chronic kidney disease) stage 3, GFR 30-59 ml/min ([place]) 82/50/5397   Umbilical hernia without obstruction and without gangrene 01/18/2018   Degenerative arthritis of knee, bilateral 05/13/2017   Osteoarthritis of spine with radiculopathy, cervical region 07/31/2016   Nocturnal leg cramps 67/34/1937   Diastolic dysfunction 90/24/0973   Pain, joint, ankle, left 07/10/2014   Olecranon bursitis of right elbow 05/17/2014   Left arm pain 03/10/2014   Biceps tendinitis on left 03/10/2014   Neck pain of over 3  months duration 03/10/2014   Chronic respiratory failure ([place]) 02/15/2014   Chest pressure 05/25/2013   Impaired glucose tolerance 12/08/2011   Chest pain, atypical 01/14/2011   Asthma with exacerbation 04/11/2009   Vitamin D deficiency 01/21/2009   Hypothyroidism 53/29/9242   METABOLIC SYNDROME X 68/34/1962   Morbid obesity ([place]) 06/24/2007   DYSTHYMIA 06/24/2007   Venous (peripheral) insufficiency 06/24/2007   Osteoarthritis 06/24/2007   UTERINE CANCER, HX OF 06/24/2007   Obstructive sleep apnea 02/28/2007   COPD mixed type ([place]) 02/28/2007   GERD 02/28/2007   Irritable bowel syndrome 02/28/2007    Conditions to be addressed/monitored: CKD Stage IIII and COPD.  Limited Social Support, Housing Barriers, ADL/IADL Limitations, Family and Relationship Dysfunction, Social Isolation, Limited Access to Building control surveyor, and Teacher, English as a foreign language of [employer].  Care Plan : LCSW Plan of Care  Updates made by Francis Gaines, LCSW since 09/10/2020 12:00 AM     Problem: Find Help with Senior Housing in My Community.   Priority: High     Long-Range Goal: Find Help with Senior Housing in My Community.   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 Limited social support and family discord with all 4 siblings regarding [employer] and family owned property where patient currently resides. Limited access to caregiver. Lacks knowledge of available housing agencies and resources in [place] and [place]. Clinical Goals:  Patient will work with LCSW over the next 90 days to address needs related to senior housing. Clinical Interventions:   Patient interviewed and appropriate assessments performed. Collaboration with Primary Care Physician, Dr. Lelon Frohlich regarding development and update of comprehensive plan of care as evidenced by provider attestation and co-signature. Inter-disciplinary care team collaboration (see longitudinal plan of care). Assessment of needs, barriers, agencies contacted, as well as how impacting. Reviewed various resources and discussed options with patient. Provided patient with information about all available senior housing resources in [place] and [place]. Discussed plans with patient for ongoing care management follow-up and provided patient with direct contact information for care management team. Mailed patient a list of all senior housing resources in [place] and [place] and advised patient to begin calling agencies of interest. Other Clinical Interventions: PHQ 2 and PHQ 9 Depression Screen performed and results reviewed with patient, Solution-Focused Strategies implemented, Deep Breathing Exercises, Relaxation Techniques and Mindfulness Meditation Strategies encouraged on a daily basis, Active Listening/Reflection utilized, Emotional Support provided, Problem Solving/Task Centered Skills utilized, Motivational Interviewing performed, Quality of Sleep assessed and Sleep Hygiene Techniques promoted, Increase in Actives/Exercise encouraged, Verbalization of Feelings encouraged, Crisis Resource Education/Information provided, and Suicidal Ideation/Homicidal Ideation assessed - None Present. Patient Goals/Self-Care Activities: Work with [employer], on a weekly basis, to obtain senior housing assistance. Begin contacting senior housing agencies to inquire about availability, pricing, etc. Contact LCSW directly [phone]) if you have questions, need assistance with application completion and submission, or if additional social work needs are identified. Follow-Up Date:  09/19/2020 at 2:00pm.      Follow Up Plan:  LCSW will follow-up with patient by phone on 09/19/2020 at 2:00pm.      Nat Christen [place] Licensed Clinical Social Worker [place] [phone]

## 2020-09-10 NOTE — Patient Instructions (Signed)
Visit Information   PATIENT GOALS:   Goals Addressed               This Visit's Progress     Find Help with Senior Housing in My Community. (pt-stated)   On track     Timeframe:  Long-Range Goal Priority:  High Start Date:  09/09/2020                      Expected End Date:   12/10/2020            Follow-Up Date: 09/19/2020 @ 2:00pm.    Patient Goals/Self-Care Activities: Work with LCSW, on a weekly basis, to obtain senior housing assistance. Begin contacting senior housing agencies to inquire about availability, pricing, etc. Contact LCSW directly [phone]) if you have questions, need assistance with application completion and submission, or if additional social work needs are identified.         Consent to CCM Services: Ms. Lauricella was given information about Chronic Care Management services today including:  CCM service includes personalized support from designated clinical staff supervised by her physician, including individualized plan of care and coordination with other care providers 24/7 contact phone numbers for assistance for urgent and routine care needs. Service will only be billed when office clinical staff spend 20 minutes or more in a month to coordinate care. Only one practitioner may furnish and bill the service in a calendar month. The patient may stop CCM services at any time (effective at the end of the month) by phone call to the office staff. The patient will be responsible for cost sharing (co-pay) of up to 20% of the service fee (after annual deductible is met).  Patient agreed to services and verbal consent obtained.   Patient verbalizes understanding of instructions provided today and agrees to view in [place].   Telephone follow-up appointment with care management team member scheduled for:  09/19/2020 at 2:00pm.  Nat Christen LCSW Licensed Clinical Social Worker [facility] [phone]   CLINICAL CARE PLAN: Patient Care Plan:  LCSW Plan of Care     Problem Identified: Find Help with Senior Housing in My Community.   Priority: High     Long-Range Goal: Find Help with Senior Housing in My Community.   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 Limited social support and family discord with all 4 siblings regarding [employer] and family owned property where patient currently resides. Limited access to caregiver. Lacks knowledge of available housing agencies and resources in [place] and [place]. Clinical Goals:  Patient will work with LCSW over the next 90 days to address needs related to senior housing. Clinical Interventions:  Patient interviewed and appropriate assessments performed. Collaboration with Primary Care Physician, Dr. Lelon Frohlich regarding development and update of comprehensive plan of care as evidenced by provider attestation and co-signature. Inter-disciplinary care team collaboration (see longitudinal plan of care). Assessment of needs, barriers, agencies contacted, as well as how impacting. Reviewed various resources and discussed options with patient. Provided patient with information about all available senior housing resources in [place] and [place]. Discussed plans with patient for ongoing care management follow-up and provided patient with direct  contact information for care management team. Mailed patient a list of all senior housing resources in [place] and [place] and advised patient to begin calling agencies of interest. Other Clinical Interventions: PHQ 2 and PHQ 9 Depression Screen performed and  results reviewed with patient, Solution-Focused Strategies implemented, Deep Breathing Exercises, Relaxation Techniques and Mindfulness Meditation Strategies encouraged on a daily basis, Active Listening/Reflection utilized, Emotional Support provided, Problem Solving/Task Centered Skills utilized, Motivational Interviewing performed, Quality of Sleep assessed and Sleep Hygiene Techniques promoted, Increase in Actives/Exercise encouraged, Verbalization of Feelings encouraged, Crisis Resource Education/Information provided, and Suicidal Ideation/Homicidal Ideation assessed - None Present. Patient Goals/Self-Care Activities: Work with [employer], on a weekly basis, to obtain senior housing assistance. Begin contacting senior housing agencies to inquire about availability, pricing, etc. Contact LCSW directly [phone]) if you have questions, need assistance with application completion and submission, or if additional social work needs are identified. Follow-Up Date: 09/19/2020 at 2:00pm.

## 2020-09-11 ENCOUNTER — Encounter: Payer: Self-pay | Admitting: *Deleted

## 2020-09-13 ENCOUNTER — Other Ambulatory Visit: Payer: Self-pay | Admitting: Internal Medicine

## 2020-09-19 ENCOUNTER — Ambulatory Visit: Payer: Medicare HMO | Admitting: *Deleted

## 2020-09-19 DIAGNOSIS — F332 Major depressive disorder, recurrent severe without psychotic features: Secondary | ICD-10-CM

## 2020-09-19 DIAGNOSIS — J441 Chronic obstructive pulmonary disease with (acute) exacerbation: Secondary | ICD-10-CM

## 2020-09-19 DIAGNOSIS — M159 Polyosteoarthritis, unspecified: Secondary | ICD-10-CM

## 2020-09-19 DIAGNOSIS — N1832 Chronic kidney disease, stage 3b: Secondary | ICD-10-CM

## 2020-09-19 DIAGNOSIS — M8949 Other hypertrophic osteoarthropathy, multiple sites: Secondary | ICD-10-CM

## 2020-09-19 DIAGNOSIS — M170 Bilateral primary osteoarthritis of knee: Secondary | ICD-10-CM

## 2020-09-19 DIAGNOSIS — M4722 Other spondylosis with radiculopathy, cervical region: Secondary | ICD-10-CM

## 2020-09-19 DIAGNOSIS — J9611 Chronic respiratory failure with hypoxia: Secondary | ICD-10-CM

## 2020-09-19 DIAGNOSIS — J449 Chronic obstructive pulmonary disease, unspecified: Secondary | ICD-10-CM

## 2020-09-19 NOTE — Chronic Care Management (AMB) (Signed)
Chronic Care Management    Clinical Social Work Note  09/19/2020 Name: Rebecca [name] MRN: [id number] DOB: [birth year]  Rebecca [name] is a 70 y.o. year old female who is a primary care patient of Isaac Bliss, Rayford Halsted, MD. The CCM team was consulted to assist the patient with chronic disease management and/or care coordination needs related to: [employer] and Level of Care Concerns.   Engaged with patient by telephone for follow-up visit in response to provider referral for social work chronic care management and care coordination services.   Consent to Services:  The patient was given information about Chronic Care Management services, agreed to services, and gave verbal consent prior to initiation of services.  Please see initial visit note for detailed documentation.   Patient agreed to services and consent obtained.   Assessment: Review of patient past medical history, allergies, medications, and health status, including review of relevant consultants reports was performed today as part of a comprehensive evaluation and provision of chronic care management and care coordination services.     SDOH (Social Determinants of Health) assessments and interventions performed:    Advanced Directives Status: Not addressed in this encounter.  CCM Care Plan  Allergies  Allergen Reactions   Keflex [Cephalexin] Diarrhea   Penicillins Nausea Only and Nausea And Vomiting    REACTION: nausea and vomiting REACTION: nausea and vomiting REACTION: nausea and vomiting    Outpatient Encounter Medications as of 09/19/2020  Medication Sig   albuterol (VENTOLIN HFA) 108 (90 Base) MCG/ACT inhaler INHALE TWO PUFFS INTO THE LUNGS EVERY 6 HOURS AS NEEDED FOR WHEEZING OR SHORTNESS OF BREATH   ALPRAZolam (XANAX) 0.5 MG tablet TAKE ONE TABLET BY MOUTH THREE TIMES A DAY AS NEEDED FOR NERVES  --- MAXIMUM 3 TABLETS ---   APPLE CIDER VINEGAR PO Take 1,000 mg by mouth daily.   Ascorbic Acid  (VITAMIN C) 1000 MG tablet Take 1,000 mg by mouth daily.   azithromycin (ZITHROMAX) 250 MG tablet TAKE ONE TABLET BY MOUTH DAILY   benzonatate (TESSALON) 100 MG capsule Take 1 capsule (100 mg total) by mouth 2 (two) times daily as needed for cough.   cetirizine (ZYRTEC) 10 MG tablet Take 10 mg by mouth daily.   Cholecalciferol (VITAMIN D-3) 125 MCG (5000 UT) TABS Take 1 tablet by mouth daily. Alternate 1k every other day with 2k   CINNAMON PO Take 2,000 mg by mouth every morning. Takes 2 every am   colchicine 0.6 MG tablet Take 1 tablet (0.6 mg total) by mouth daily.   Fluticasone-Umeclidin-Vilant (TRELEGY ELLIPTA) 100-62.5-25 MCG/INH AEPB Inhale 1 puff into the lungs daily.   furosemide (LASIX) 40 MG tablet TAKE ONE TO TWO TABLETS BY MOUTH DAILY AS NEEDED FOR SWELLING   glucosamine-chondroitin 500-400 MG tablet Take 2 tablets by mouth 2 (two) times daily.    GUAIFENESIN ER PO Take 400 mg by mouth 2 (two) times daily. Tales 3 tab;es twice a day   HYDROcodone-acetaminophen (NORCO) 5-325 MG tablet Take 1 tablet by mouth every 6 (six) hours as needed for moderate pain.   ipratropium-albuterol (DUONEB) 0.5-2.5 (3) MG/3ML SOLN INHALE 1 VIAL VIA NEBULIZATION THREE TIMES A DAY   Magnesium 400 MG CAPS Take 3 capsules by mouth 2 (two) times daily.    methylPREDNISolone (MEDROL DOSEPAK) 4 MG TBPK tablet Take as directed   Misc Natural Products (TART CHERRY ADVANCED PO) Take by mouth.   MITIGARE 0.6 MG CAPS TAKE ONE CAPSULE BY MOUTH TWICE A DAY  OVER THE COUNTER MEDICATION CBD Oil   OXYGEN Inhale 4 L into the lungs.   predniSONE (DELTASONE) 10 MG tablet Take 1 tablet (10 mg total) by mouth daily with breakfast.   predniSONE (DELTASONE) 5 MG tablet Take 1 tablet (5 mg total) by mouth daily with breakfast.   sertraline (ZOLOFT) 100 MG tablet Take 1 tablet (100 mg total) by mouth daily.   sertraline (ZOLOFT) 50 MG tablet TAKE ONE TABLET BY MOUTH DAILY   sodium chloride HYPERTONIC 3 % nebulizer solution  Take by nebulization 2 (two) times daily.   Spacer/Aero-Holding Chambers (AEROCHAMBER MV) inhaler Use as instructed   traMADol (ULTRAM) 50 MG tablet Take 1 tablet (50 mg total) by mouth 3 (three) times daily as needed for moderate pain.   TRELEGY ELLIPTA 100-62.5-25 MCG/INH AEPB INHALE ONE PUFF BY MOUTH DAILY   TURMERIC PO Take 1,000 mg by mouth 2 (two) times daily. Takes 1,000 mg   UNABLE TO FIND Med Name: [place] OTC   vitamin A 10000 UNIT capsule Take 5,000 Units by mouth daily. Per pt list - takes 25,000   No facility-administered encounter medications on file as of 09/19/2020.    Patient Active Problem List   Diagnosis Date Noted   Bradycardia with 31-40 beats per minute 01/04/2020   Takes dietary supplements 04/26/2018   DNR (do not resuscitate) 04/05/2018   CKD (chronic kidney disease) stage 3, GFR 30-59 ml/min ([place]) 16/60/6301   Umbilical hernia without obstruction and without gangrene 01/18/2018   Degenerative arthritis of knee, bilateral 05/13/2017   Osteoarthritis of spine with radiculopathy, cervical region 07/31/2016   Nocturnal leg cramps 60/11/9321   Diastolic dysfunction 55/73/2202   Pain, joint, ankle, left 07/10/2014   Olecranon bursitis of right elbow 05/17/2014   Left arm pain 03/10/2014   Biceps tendinitis on left 03/10/2014   Neck pain of over 3 months duration 03/10/2014   Chronic respiratory failure ([place]) 02/15/2014   Chest pressure 05/25/2013   Impaired glucose tolerance 12/08/2011   Chest pain, atypical 01/14/2011   Asthma with exacerbation 04/11/2009   Vitamin D deficiency 01/21/2009   Hypothyroidism 54/27/0623   METABOLIC SYNDROME X 76/28/3151   Morbid obesity ([place]) 06/24/2007   DYSTHYMIA 06/24/2007   Venous (peripheral) insufficiency 06/24/2007   Osteoarthritis 06/24/2007   UTERINE CANCER, HX OF 06/24/2007   Obstructive sleep apnea 02/28/2007   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  Limited Social Support, Housing Barriers, Level of Care Concerns, Social Isolation, Limited Access to Building control surveyor, and Teacher, English as a foreign language of [employer].  Care Plan : LCSW Plan of Care  Updates made by Francis Gaines, LCSW since 09/19/2020 12:00 AM     Problem: Find Help with Senior Housing in My Community.   Priority: High     Long-Range Goal: Find Help with Senior Housing in My Community.   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 Limited social support and family discord with all 4 siblings regarding [employer] and family owned property where patient currently resides. Limited access to caregiver. Lacks knowledge of available housing  agencies and resources in [place] and [place]. Clinical Goals:  Patient will work with LCSW over the next 90 days to address needs related to senior housing. Clinical Interventions:  Patient interviewed and appropriate assessments performed. Collaboration with Primary Care Physician, Dr. Lelon Frohlich regarding development and update of comprehensive plan of care as evidenced by provider attestation and co-signature. Inter-disciplinary care team collaboration (see longitudinal plan of care). Assessment of needs, barriers, agencies contacted, as well as how impacting. Reviewed various resources and discussed options with patient. Provided patient with information about all available senior housing resources in [place] and [place]. Discussed plans with patient for ongoing care  management follow-up and provided patient with direct contact information for care management team. Mailed patient a list of all senior housing resources in [place] and [place] and advised patient to begin calling agencies of interest. Other Clinical Interventions: PHQ 2 and PHQ 9 Depression Screen performed and results reviewed with patient, Solution-Focused Strategies implemented, Deep Breathing Exercises, Relaxation Techniques and Mindfulness Meditation Strategies encouraged on a daily basis, Active Listening/Reflection utilized, Emotional Support provided, Problem Solving/Task Centered Skills utilized, Motivational Interviewing performed, Quality of Sleep assessed and Sleep Hygiene Techniques promoted, Increase in Actives/Exercise encouraged, Verbalization of Feelings encouraged, Crisis Resource Education/Information provided, and Suicidal Ideation/Homicidal Ideation assessed - None Present. Patient Goals/Self-Care Activities: Work with [employer], on a weekly basis, to obtain senior housing assistance. Continue to contact senior housing agencies to inquire about availability, pricing, etc. Consider applying for [employer] or Section 8 Housing, through the [employer]. LCSW agreed to contact Wells Guiles, Admissions Coordinator at [facility], to check availability for an independent cottage. Contact LCSW directly (# [phone]) if you have questions, need assistance with application completion and/or submission, or if additional social work needs are identified before LCSW next scheduled outreach call. Follow-Up Date: 09/25/2020 at 1:00pm.      Follow-Up Plan:  09/25/2020 at 1:00pm.      Nat Christen LCSW Licensed Clinical Social Worker [place] [phone]

## 2020-09-19 NOTE — Patient Instructions (Signed)
Visit Information  PATIENT GOALS:  Goals Addressed               This Visit's Progress     Find Help with Senior Housing in My Community. (pt-stated)   On track     Timeframe:  Long-Range Goal Priority:  High Start Date:  09/09/2020                      Expected End Date:   12/10/2020            Follow-Up Date: 09/25/2020 at 1:00pm.    Patient Goals/Self-Care Activities: Work with LCSW, on a weekly basis, to obtain senior housing assistance. Continue to contact senior housing agencies to inquire about availability, pricing, etc. Consider applying for [employer] or Section 8 Housing, through the [employer]. LCSW agreed to contact Wells Guiles, Admissions Coordinator at [facility], to check availability for an independent cottage. Contact LCSW directly (# [phone] if you have questions, need assistance with application completion and/or submission, or if additional social work needs are identified before LCSW next scheduled outreach call.        Patient verbalizes understanding of instructions provided today and agrees to view in [place].   Telephone follow-up appointment with care management team member scheduled for:  09/25/2020 at 1:00pm.  Nat Christen LCSW Licensed Clinical Social Worker [place] [phone]

## 2020-09-25 ENCOUNTER — Ambulatory Visit: Payer: Medicare HMO | Admitting: *Deleted

## 2020-09-25 DIAGNOSIS — J449 Chronic obstructive pulmonary disease, unspecified: Secondary | ICD-10-CM

## 2020-09-25 DIAGNOSIS — M159 Polyosteoarthritis, unspecified: Secondary | ICD-10-CM

## 2020-09-25 DIAGNOSIS — F332 Major depressive disorder, recurrent severe without psychotic features: Secondary | ICD-10-CM

## 2020-09-25 DIAGNOSIS — N1832 Chronic kidney disease, stage 3b: Secondary | ICD-10-CM

## 2020-09-25 DIAGNOSIS — M150 Primary generalized (osteo)arthritis: Secondary | ICD-10-CM

## 2020-09-25 DIAGNOSIS — M8949 Other hypertrophic osteoarthropathy, multiple sites: Secondary | ICD-10-CM

## 2020-09-25 DIAGNOSIS — J9611 Chronic respiratory failure with hypoxia: Secondary | ICD-10-CM

## 2020-09-25 DIAGNOSIS — Z6841 Body Mass Index (BMI) 40.0 and over, adult: Secondary | ICD-10-CM

## 2020-09-25 NOTE — Chronic Care Management (AMB) (Signed)
Chronic Care Management    Clinical Social Work Note  09/25/2020 Name: Rebecca [name] MRN: [id number] DOB: [birth year]  Rebecca [name] is a 70 y.o. year old female who is a primary care patient of Isaac Bliss, Rayford Halsted, MD. The CCM team was consulted to assist the patient with chronic disease management and/or care coordination needs related to: [employer] .   Engaged with patient by telephone for follow-up visit in response to provider referral for social work chronic care management and care coordination services.   Consent to Services:  The patient was given information about Chronic Care Management services, agreed to services, and gave verbal consent prior to initiation of services.  Please see initial visit note for detailed documentation.   Patient agreed to services and consent obtained.   Assessment: Review of patient past medical history, allergies, medications, and health status, including review of relevant consultants reports was performed today as part of a comprehensive evaluation and provision of chronic care management and care coordination services.     SDOH (Social Determinants of Health) assessments and interventions performed:    Advanced Directives Status: Not addressed in this encounter.  CCM Care Plan  Allergies  Allergen Reactions   Keflex [Cephalexin] Diarrhea   Penicillins Nausea Only and Nausea And Vomiting    REACTION: nausea and vomiting REACTION: nausea and vomiting REACTION: nausea and vomiting    Outpatient Encounter Medications as of 09/25/2020  Medication Sig   albuterol (VENTOLIN HFA) 108 (90 Base) MCG/ACT inhaler INHALE TWO PUFFS INTO THE LUNGS EVERY 6 HOURS AS NEEDED FOR WHEEZING OR SHORTNESS OF BREATH   ALPRAZolam (XANAX) 0.5 MG tablet TAKE ONE TABLET BY MOUTH THREE TIMES A DAY AS NEEDED FOR NERVES  --- MAXIMUM 3 TABLETS ---   APPLE CIDER VINEGAR PO Take 1,000 mg by mouth daily.   Ascorbic Acid (VITAMIN C) 1000 MG  tablet Take 1,000 mg by mouth daily.   azithromycin (ZITHROMAX) 250 MG tablet TAKE ONE TABLET BY MOUTH DAILY   benzonatate (TESSALON) 100 MG capsule Take 1 capsule (100 mg total) by mouth 2 (two) times daily as needed for cough.   cetirizine (ZYRTEC) 10 MG tablet Take 10 mg by mouth daily.   Cholecalciferol (VITAMIN D-3) 125 MCG (5000 UT) TABS Take 1 tablet by mouth daily. Alternate 1k every other day with 2k   CINNAMON PO Take 2,000 mg by mouth every morning. Takes 2 every am   colchicine 0.6 MG tablet Take 1 tablet (0.6 mg total) by mouth daily.   Fluticasone-Umeclidin-Vilant (TRELEGY ELLIPTA) 100-62.5-25 MCG/INH AEPB Inhale 1 puff into the lungs daily.   furosemide (LASIX) 40 MG tablet TAKE ONE TO TWO TABLETS BY MOUTH DAILY AS NEEDED FOR SWELLING   glucosamine-chondroitin 500-400 MG tablet Take 2 tablets by mouth 2 (two) times daily.    GUAIFENESIN ER PO Take 400 mg by mouth 2 (two) times daily. Tales 3 tab;es twice a day   HYDROcodone-acetaminophen (NORCO) 5-325 MG tablet Take 1 tablet by mouth every 6 (six) hours as needed for moderate pain.   ipratropium-albuterol (DUONEB) 0.5-2.5 (3) MG/3ML SOLN INHALE 1 VIAL VIA NEBULIZATION THREE TIMES A DAY   Magnesium 400 MG CAPS Take 3 capsules by mouth 2 (two) times daily.    methylPREDNISolone (MEDROL DOSEPAK) 4 MG TBPK tablet Take as directed   Misc Natural Products (TART CHERRY ADVANCED PO) Take by mouth.   MITIGARE 0.6 MG CAPS TAKE ONE CAPSULE BY MOUTH TWICE A DAY   OVER THE COUNTER MEDICATION  CBD Oil   OXYGEN Inhale 4 L into the lungs.   predniSONE (DELTASONE) 10 MG tablet Take 1 tablet (10 mg total) by mouth daily with breakfast.   predniSONE (DELTASONE) 5 MG tablet Take 1 tablet (5 mg total) by mouth daily with breakfast.   sertraline (ZOLOFT) 100 MG tablet Take 1 tablet (100 mg total) by mouth daily.   sertraline (ZOLOFT) 50 MG tablet TAKE ONE TABLET BY MOUTH DAILY   sodium chloride HYPERTONIC 3 % nebulizer solution Take by nebulization 2  (two) times daily.   Spacer/Aero-Holding Chambers (AEROCHAMBER MV) inhaler Use as instructed   traMADol (ULTRAM) 50 MG tablet Take 1 tablet (50 mg total) by mouth 3 (three) times daily as needed for moderate pain.   TRELEGY ELLIPTA 100-62.5-25 MCG/INH AEPB INHALE ONE PUFF BY MOUTH DAILY   TURMERIC PO Take 1,000 mg by mouth 2 (two) times daily. Takes 1,000 mg   UNABLE TO FIND Med Name: Mullien OTC   vitamin A 10000 UNIT capsule Take 5,000 Units by mouth daily. Per pt list - takes 25,000   No facility-administered encounter medications on file as of 09/25/2020.    Patient Active Problem List   Diagnosis Date Noted   Bradycardia with 31-40 beats per minute 01/04/2020   Takes dietary supplements 04/26/2018   DNR (do not resuscitate) 04/05/2018   CKD (chronic kidney disease) stage 3, GFR 30-59 ml/min ([place]) 123456   Umbilical hernia without obstruction and without gangrene 01/18/2018   Degenerative arthritis of knee, bilateral 05/13/2017   Osteoarthritis of spine with radiculopathy, cervical region 07/31/2016   Nocturnal leg cramps 99991111   Diastolic dysfunction 123XX123   Pain, joint, ankle, left 07/10/2014   Olecranon bursitis of right elbow 05/17/2014   Left arm pain 03/10/2014   Biceps tendinitis on left 03/10/2014   Neck pain of over 3 months duration 03/10/2014   Chronic respiratory failure ([place]) 02/15/2014   Chest pressure 05/25/2013   Impaired glucose tolerance 12/08/2011   Chest pain, atypical 01/14/2011   Asthma with exacerbation 04/11/2009   Vitamin D deficiency 01/21/2009   Hypothyroidism XX123456   METABOLIC SYNDROME X XX123456   Morbid obesity ([place]) 06/24/2007   DYSTHYMIA 06/24/2007   Venous (peripheral) insufficiency 06/24/2007   Osteoarthritis 06/24/2007   UTERINE CANCER, HX OF 06/24/2007   Obstructive sleep apnea 02/28/2007   COPD mixed type ([place]) 02/28/2007   GERD 02/28/2007   Irritable bowel syndrome 02/28/2007    Conditions to be  addressed/monitored: COPD and CKD Stage 4.  Limited social support, housing barriers, ADL/IADL limitations, limited access to caregiver, and lacks knowledge of community resources.  Care Plan : LCSW Plan of Care  Updates made by Francis Gaines, LCSW since 09/25/2020 12:00 AM     Problem: Find Help with Senior Housing in My Community.   Priority: High     Long-Range Goal: Find Help with Senior Housing in My Community.   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 Limited social support and family discord with all 4 siblings regarding [employer] and family owned property where patient currently resides. Limited access to caregiver. Lacks knowledge of available housing agencies and resources in [place] and [place].  Clinical Goals:  Patient will work with LCSW over the next 90 days to address needs related to senior housing. Clinical Interventions:  Patient interviewed and appropriate assessments performed. Collaboration with Primary Care Physician, Dr. Lelon Frohlich regarding development and update of comprehensive plan of care as evidenced by provider attestation and co-signature. Inter-disciplinary care team collaboration (see longitudinal plan of care). Assessment of needs, barriers, agencies contacted, as well as how impacting. Reviewed various resources and discussed options with patient. Provided patient with information about all available senior housing resources in [place] and [place]. Discussed plans with patient for ongoing care management follow-up and provided patient with  direct contact information for care management team. Mailed patient a list of all senior housing resources in [place] and [place] and advised patient to begin calling agencies of interest. Other Clinical Interventions: PHQ 2 and PHQ 9 Depression Screen performed and results reviewed with patient, Solution-Focused Strategies implemented, Deep Breathing Exercises, Relaxation Techniques and Mindfulness Meditation Strategies encouraged on a daily basis, Active Listening/Reflection utilized, Emotional Support provided, Problem Solving/Task Centered Skills utilized, Motivational Interviewing performed, Quality of Sleep assessed and Sleep Hygiene Techniques promoted, Increase in Actives/Exercise encouraged, Verbalization of Feelings encouraged, Crisis Resource Education/Information provided, and Suicidal Ideation/Homicidal Ideation assessed - None Present. Patient Goals/Self-Care Activities: Work with [employer], on a weekly basis, to obtain senior housing assistance. Consider applying for [employer] or Section 8 Housing, through the [employer]. [place] [facility] [phone]) to check pricing and availability.   [place] [facility] [phone]) to check pricing and availability. [place] [facility] [phone]) to check pricing and availability. [place] [facility] [phone]) to check pricing and availability. [place] [facility] [phone]) to check pricing and availability. Contact [facility] [place] [facility] [phone]) to check pricing and availability. Contact LCSW directly (# [phone]) if you have questions, need assistance with application completion and/or submission, or if additional social work needs are identified  before LCSW next scheduled outreach call. Follow-Up Date:  10/03/2020 at 12:00pm.      Follow-Up Plan:  LCSW will follow-up with patient by phone on 10/03/2020 at 12:00pm.      Nat Christen LCSW Licensed Clinical Social Worker [place] [phone]

## 2020-09-25 NOTE — Patient Instructions (Signed)
Visit Information  PATIENT GOALS:  Goals Addressed               This Visit's Progress     Find Help with Senior Housing in My Community. (pt-stated)   On track     Timeframe:  Long-Range Goal Priority:  High Start Date:  09/09/2020                      Expected End Date:   12/10/2020            Follow-Up Date: 10/03/2020 at 12:00pm.   Patient Goals/Self-Care Activities: Work with LCSW, on a weekly basis, to obtain senior housing assistance. Consider applying for [employer] or Section 8 Housing, through the [employer]. [place] [facility] [phone]) to check pricing and availability.   [place] [facility] [phone]) to check pricing and availability. [place] [facility] [phone]) to check pricing and availability. [place] [facility] [phone]) to check pricing and availability. [place] [facility] [phone]) to check pricing and availability. Contact [facility] [place] [facility] [phone]) to check pricing and availability. Contact LCSW directly (# [phone]) if you have questions, need assistance with application completion and/or submission, or if additional social work needs are identified before LCSW next scheduled outreach call.        Patient verbalizes understanding of instructions provided today and agrees to view in [place].   Telephone follow-up appointment with care management team member scheduled for:  10/03/2020 at 12:00pm.  Nat Christen LCSW Licensed Clinical Social Worker [place] [phone]

## 2020-09-27 ENCOUNTER — Ambulatory Visit (INDEPENDENT_AMBULATORY_CARE_PROVIDER_SITE_OTHER): Payer: Medicare HMO | Admitting: Pulmonary Disease

## 2020-09-27 ENCOUNTER — Other Ambulatory Visit: Payer: Self-pay

## 2020-09-27 ENCOUNTER — Encounter: Payer: Self-pay | Admitting: Pulmonary Disease

## 2020-09-27 VITALS — BP 128/84 | HR 98 | Temp 98.4°F | Wt 245.4 lb

## 2020-09-27 DIAGNOSIS — J449 Chronic obstructive pulmonary disease, unspecified: Secondary | ICD-10-CM | POA: Diagnosis not present

## 2020-09-27 MED ORDER — MORPHINE SULFATE 10 MG/5ML PO SOLN: 5.0000 mg | ORAL | Status: DC | PRN

## 2020-09-27 MED ORDER — METHYLPREDNISOLONE 2 MG PO TABS: ORAL_TABLET | ORAL | 5 refills | Status: DC

## 2020-09-27 NOTE — Progress Notes (Signed)
Rebecca [name]    [id number]    24-Nov-1950  Primary Care Physician:Hernandez Everardo Beals, MD  Referring Physician: Isaac Bliss, Rayford Halsted, MD [place],  [place] [address]  Chief complaint: Follow-up for COPD  HPI: 70 year old with history of severe COPD, fibromyalgia, obesity, OSA, vitamin D deficiency, GERD She is a former patient of Dr. Lenna Gilford.  Maintained on Symbicort and Spiriva She has noted increasing dyspnea over the past year.  Has chronic cough with minimal mucus production.  She is following up with [facility] for endobronchial wall placement.  Evaluated by Dr. Kerin Ransom in June 2020.  Recommended that she needs to improve her BMI from 41 to 35 before being considered for the procedure  She has enrolled in pulmonary rehab in 2021 and attended 2 sessions but has stopped after Covid pandemic.  In addition she is finding it hard to keep the mask on during rehab due to dyspnea.  Prescribed Daliresp in early 2021.  This was poorly tolerated with nausea, diarrhea, mood changes including suicidal thoughts.  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 Exposures: No known exposures.  No mold, hot tub, Smoking history: 30-pack-year smoker.  Quit in 1993 Travel history: No significant travel history Relevant family history: No significant family history of lung disease  Interim history: Continues on chronic azithromycin, Trelegy and supplemental oxygen  Has significant dyspnea on exertion which is affecting her quality of life.  She can hardly move from one room to other without issue Her dyspnea is causing significant mood  changes  Outpatient Encounter Medications as of 09/27/2020  Medication Sig   albuterol (VENTOLIN HFA) 108 (90 Base) MCG/ACT inhaler INHALE TWO PUFFS INTO THE LUNGS EVERY 6 HOURS AS NEEDED FOR WHEEZING OR SHORTNESS OF BREATH   ALPRAZolam (XANAX) 0.5 MG tablet TAKE ONE TABLET BY MOUTH THREE TIMES A DAY AS NEEDED FOR NERVES  --- MAXIMUM 3 TABLETS ---   APPLE CIDER VINEGAR PO Take 1,000 mg by mouth daily.   Ascorbic Acid (VITAMIN C) 1000 MG tablet Take 1,000 mg by mouth daily.   azithromycin (ZITHROMAX) 250 MG tablet TAKE ONE TABLET BY MOUTH DAILY   cetirizine (ZYRTEC) 10 MG tablet Take 10 mg by mouth daily.   Cholecalciferol (VITAMIN D-3) 125 MCG (5000 UT) TABS Take 1 tablet by mouth daily. Alternate 1k every other day with 2k   CINNAMON PO Take 2,000 mg by mouth every morning. Takes 2 every am   colchicine 0.6 MG tablet Take 1 tablet (0.6 mg total) by mouth daily.   furosemide (LASIX) 40 MG tablet TAKE ONE TO TWO TABLETS BY MOUTH DAILY AS NEEDED FOR SWELLING   glucosamine-chondroitin 500-400 MG tablet Take 2 tablets by mouth 2 (two) times daily.    GUAIFENESIN ER PO Take 400 mg by mouth 2 (two) times daily. Tales 3 tab;es twice a day   HYDROcodone-acetaminophen (NORCO) 5-325 MG tablet Take 1 tablet by mouth every 6 (six) hours as needed for moderate pain.   ipratropium-albuterol (DUONEB) 0.5-2.5 (3) MG/3ML SOLN INHALE 1 VIAL VIA NEBULIZATION THREE TIMES A DAY   Magnesium 400 MG CAPS  Take 3 capsules by mouth 2 (two) times daily.    Misc Natural Products (TART CHERRY ADVANCED PO) Take by mouth.   MITIGARE 0.6 MG CAPS TAKE ONE CAPSULE BY MOUTH TWICE A DAY   OVER THE COUNTER MEDICATION CBD Oil   OXYGEN Inhale 4 L into the lungs.   sertraline (ZOLOFT) 100 MG tablet Take 1 tablet (100 mg total) by mouth daily.   sertraline (ZOLOFT) 50 MG tablet TAKE ONE TABLET BY MOUTH DAILY   sodium chloride HYPERTONIC 3 % nebulizer solution Take by nebulization 2 (two) times daily.   Spacer/Aero-Holding Chambers  (AEROCHAMBER MV) inhaler Use as instructed   traMADol (ULTRAM) 50 MG tablet Take 1 tablet (50 mg total) by mouth 3 (three) times daily as needed for moderate pain.   TRELEGY ELLIPTA 100-62.5-25 MCG/INH AEPB INHALE ONE PUFF BY MOUTH DAILY   TURMERIC PO Take 1,000 mg by mouth 2 (two) times daily. Takes 1,000 mg   UNABLE TO FIND Med Name: Mullien OTC   vitamin A 10000 UNIT capsule Take 5,000 Units by mouth daily. Per pt list - takes 25,000   [DISCONTINUED] benzonatate (TESSALON) 100 MG capsule Take 1 capsule (100 mg total) by mouth 2 (two) times daily as needed for cough.   [DISCONTINUED] Fluticasone-Umeclidin-Vilant (TRELEGY ELLIPTA) 100-62.5-25 MCG/INH AEPB Inhale 1 puff into the lungs daily.   [DISCONTINUED] methylPREDNISolone (MEDROL DOSEPAK) 4 MG TBPK tablet Take as directed   [DISCONTINUED] predniSONE (DELTASONE) 10 MG tablet Take 1 tablet (10 mg total) by mouth daily with breakfast.   [DISCONTINUED] predniSONE (DELTASONE) 5 MG tablet Take 1 tablet (5 mg total) by mouth daily with breakfast.   No facility-administered encounter medications on file as of 09/27/2020.   Physical Exam: Blood pressure 128/84, pulse 98, temperature 98.4 F (36.9 C), temperature source Oral, weight 245 lb 6.4 oz (111.3 kg), SpO2 93 %. Gen:      No acute distress HEENT:  EOMI, sclera anicteric Neck:     No masses; no thyromegaly Lungs:    Diminished breath sounds CV:         Regular rate and rhythm; no murmurs Abd:      + bowel sounds; soft, non-tender; no palpable masses, no distension Ext:    No edema; adequate peripheral perfusion Skin:      Warm and dry; no rash Neuro: alert and oriented x 3 Psych: normal mood and affect   Data Reviewed: Imaging: Chest x-ray 04/16/18- Emphysematous changes in the upper lobes with mild atelectasis in the right base.  CT chest ([facility]) 08/18/2018-severe centrilobular emphysema with bullous disease, possible dilatation of left renal collecting system > pt has already seen a  urologist at [facility] for this  Chest x-ray 08/24/2019-emphysematous changes.  No acute abnormality.  PFTs: 06/05/2014 FVC 1.6 (50%), FEV1 0.9 [35%],/55 Severe airway obstruction  Assessment:  Severe COPD on supplemental oxygen Continue Trelegy inhaler, supplemental oxygen Off Daliresp due to side effects She continues on chronic azithromycin daily.  Has been tapered off prednisone due to weight gain but continues to have significant dyspnea affecting quality of life We discussed pros and cons of chronic steroid therapy and will try Medrol at 10 mg/day Start morphine 5 mg every 4 hours as needed for relief of her hunger and dyspnea Place a consult to palliative care Instructions given on [place] breathing techniques Continue flutter valve, hypertonic saline for mucociliary clearance  Plan/Recommendations: Continue Trelegy inhaler Supplemental oxygen Azithromycin 250 mg daily Flutter valve, hypertonic saline, Mucinex Start tapering Medrol 10 mg  a day Breathing techniques, morphine for air hunger Palliative care consult  Marshell Garfinkel MD [facility] 09/27/2020, 4:02 PM  CC: Isaac Bliss, [place]*

## 2020-09-27 NOTE — Patient Instructions (Addendum)
We will start you on Medrol 10 mg a day at a constant dose Prescribed morphine  as needed for air hunger and dyspnea I will make referral to palliative care for symptom management of severe COPD Continue supplemental oxygen, inhalers and azithromycin we will add input follow-up  Follow-up in 3 to 4 months with video visit or in person

## 2020-10-01 ENCOUNTER — Telehealth: Payer: Self-pay

## 2020-10-01 NOTE — Telephone Encounter (Signed)
Spoke with patient and scheduled an in-person Palliative Consult for 10/31/20 @ 12:30 PM.  COVID screening was negative. One dog in the home that is paralyzed he stays in a pack and play. Patient lives with a roommate.  Consent obtained; updated Outlook/Netsmart/Team List and Epic.   Family is aware they may be receiving a call from provider the day before or day of to confirm appointment.

## 2020-10-02 ENCOUNTER — Telehealth: Payer: Self-pay | Admitting: Pulmonary Disease

## 2020-10-02 DIAGNOSIS — J449 Chronic obstructive pulmonary disease, unspecified: Secondary | ICD-10-CM | POA: Diagnosis not present

## 2020-10-02 NOTE — Telephone Encounter (Signed)
Called and spoke with patient regarding getting meals delivered to her through her insurance company. Patient states she called and spoke with the insurance company. The insurance company states that she qualifies for 2 meals a day to be delivered for 12 weeks, twice a year due to her COPD. States she feels that the nutrition will help her. Patients states that all we need to do is call and give an order to [employer].   Use code: CNB300  Phone number is [phone]  Patient also states that her pharmacy did not have her Medrol in stock. She has been taking Prednisone 5 mg that she had left over at home. States on Sunday she started coughing up thick, wet mucus. States that the prednisone is helping slowly. States she is coughing up mucus that is deep in her lungs. Medrol is at the pharmacy now and she is going to pick it up.

## 2020-10-03 ENCOUNTER — Ambulatory Visit (INDEPENDENT_AMBULATORY_CARE_PROVIDER_SITE_OTHER): Payer: Medicare HMO | Admitting: *Deleted

## 2020-10-03 DIAGNOSIS — M8949 Other hypertrophic osteoarthropathy, multiple sites: Secondary | ICD-10-CM

## 2020-10-03 DIAGNOSIS — J449 Chronic obstructive pulmonary disease, unspecified: Secondary | ICD-10-CM

## 2020-10-03 DIAGNOSIS — J9611 Chronic respiratory failure with hypoxia: Secondary | ICD-10-CM

## 2020-10-03 DIAGNOSIS — J441 Chronic obstructive pulmonary disease with (acute) exacerbation: Secondary | ICD-10-CM

## 2020-10-03 DIAGNOSIS — M4722 Other spondylosis with radiculopathy, cervical region: Secondary | ICD-10-CM

## 2020-10-03 DIAGNOSIS — M170 Bilateral primary osteoarthritis of knee: Secondary | ICD-10-CM

## 2020-10-03 DIAGNOSIS — M159 Polyosteoarthritis, unspecified: Secondary | ICD-10-CM

## 2020-10-03 DIAGNOSIS — M150 Primary generalized (osteo)arthritis: Secondary | ICD-10-CM

## 2020-10-03 DIAGNOSIS — E66813 Obesity, class 3: Secondary | ICD-10-CM

## 2020-10-03 NOTE — Telephone Encounter (Signed)
Patient states Morphine was not at the pharmacy. Pharmacy is [facility]. Patient phone number is [phone]. May leave detailed message on voicemail.

## 2020-10-03 NOTE — Chronic Care Management (AMB) (Signed)
Chronic Care Management    Clinical Social Work Note  10/03/2020 Name: Rebecca [name] MRN: [id number] DOB: [birth year]  Rebecca [name] is a 70 y.o. year old female who is a primary care patient of Isaac Bliss, Rayford Halsted, MD. The CCM team was consulted to assist the patient with chronic disease management and/or care coordination needs related to: [employer].   Engaged with patient by telephone for follow-up visit in response to provider referral for social work chronic care management and care coordination services.   Consent to Services:  The patient was given information about Chronic Care Management services, agreed to services, and gave verbal consent prior to initiation of services.  Please see initial visit note for detailed documentation.   Patient agreed to services and consent obtained.   Assessment: Review of patient past medical history, allergies, medications, and health status, including review of relevant consultants reports was performed today as part of a comprehensive evaluation and provision of chronic care management and care coordination services.     SDOH (Social Determinants of Health) assessments and interventions performed:    Advanced Directives Status: Not addressed in this encounter.  CCM Care Plan  Allergies  Allergen Reactions   Keflex [Cephalexin] Diarrhea   Penicillins Nausea Only and Nausea And Vomiting    REACTION: nausea and vomiting REACTION: nausea and vomiting REACTION: nausea and vomiting    Outpatient Encounter Medications as of 10/03/2020  Medication Sig   albuterol (VENTOLIN HFA) 108 (90 Base) MCG/ACT inhaler INHALE TWO PUFFS INTO THE LUNGS EVERY 6 HOURS AS NEEDED FOR WHEEZING OR SHORTNESS OF BREATH   ALPRAZolam (XANAX) 0.5 MG tablet TAKE ONE TABLET BY MOUTH THREE TIMES A DAY AS NEEDED FOR NERVES  --- MAXIMUM 3 TABLETS ---   APPLE CIDER VINEGAR PO Take 1,000 mg by mouth daily.   Ascorbic Acid (VITAMIN C) 1000 MG tablet  Take 1,000 mg by mouth daily.   azithromycin (ZITHROMAX) 250 MG tablet TAKE ONE TABLET BY MOUTH DAILY   cetirizine (ZYRTEC) 10 MG tablet Take 10 mg by mouth daily.   Cholecalciferol (VITAMIN D-3) 125 MCG (5000 UT) TABS Take 1 tablet by mouth daily. Alternate 1k every other day with 2k   CINNAMON PO Take 2,000 mg by mouth every morning. Takes 2 every am   colchicine 0.6 MG tablet Take 1 tablet (0.6 mg total) by mouth daily.   furosemide (LASIX) 40 MG tablet TAKE ONE TO TWO TABLETS BY MOUTH DAILY AS NEEDED FOR SWELLING   glucosamine-chondroitin 500-400 MG tablet Take 2 tablets by mouth 2 (two) times daily.    GUAIFENESIN ER PO Take 400 mg by mouth 2 (two) times daily. Tales 3 tab;es twice a day   HYDROcodone-acetaminophen (NORCO) 5-325 MG tablet Take 1 tablet by mouth every 6 (six) hours as needed for moderate pain.   ipratropium-albuterol (DUONEB) 0.5-2.5 (3) MG/3ML SOLN INHALE 1 VIAL VIA NEBULIZATION THREE TIMES A DAY   Magnesium 400 MG CAPS Take 3 capsules by mouth 2 (two) times daily.    methylPREDNISolone (MEDROL) 2 MG tablet Take '10mg'$  daily   Misc Natural Products (TART CHERRY ADVANCED PO) Take by mouth.   MITIGARE 0.6 MG CAPS TAKE ONE CAPSULE BY MOUTH TWICE A DAY   OVER THE COUNTER MEDICATION CBD Oil   OXYGEN Inhale 4 L into the lungs.   sertraline (ZOLOFT) 100 MG tablet Take 1 tablet (100 mg total) by mouth daily.   sertraline (ZOLOFT) 50 MG tablet TAKE ONE TABLET BY MOUTH DAILY  sodium chloride HYPERTONIC 3 % nebulizer solution Take by nebulization 2 (two) times daily.   Spacer/Aero-Holding Chambers (AEROCHAMBER MV) inhaler Use as instructed   traMADol (ULTRAM) 50 MG tablet Take 1 tablet (50 mg total) by mouth 3 (three) times daily as needed for moderate pain.   TRELEGY ELLIPTA 100-62.5-25 MCG/INH AEPB INHALE ONE PUFF BY MOUTH DAILY   TURMERIC PO Take 1,000 mg by mouth 2 (two) times daily. Takes 1,000 mg   UNABLE TO FIND Med Name: Mullien OTC   vitamin A 10000 UNIT capsule Take 5,000  Units by mouth daily. Per pt list - takes 25,000   Facility-Administered Encounter Medications as of 10/03/2020  Medication   morphine 10 MG/5ML solution 5 mg    Patient Active Problem List   Diagnosis Date Noted   Bradycardia with 31-40 beats per minute 01/04/2020   Takes dietary supplements 04/26/2018   DNR (do not resuscitate) 04/05/2018   CKD (chronic kidney disease) stage 3, GFR 30-59 ml/min ([place]) 123456   Umbilical hernia without obstruction and without gangrene 01/18/2018   Degenerative arthritis of knee, bilateral 05/13/2017   Osteoarthritis of spine with radiculopathy, cervical region 07/31/2016   Nocturnal leg cramps 99991111   Diastolic dysfunction 123XX123   Pain, joint, ankle, left 07/10/2014   Olecranon bursitis of right elbow 05/17/2014   Left arm pain 03/10/2014   Biceps tendinitis on left 03/10/2014   Neck pain of over 3 months duration 03/10/2014   Chronic respiratory failure ([place]) 02/15/2014   Chest pressure 05/25/2013   Impaired glucose tolerance 12/08/2011   Chest pain, atypical 01/14/2011   Asthma with exacerbation 04/11/2009   Vitamin D deficiency 01/21/2009   Hypothyroidism XX123456   METABOLIC SYNDROME X XX123456   Morbid obesity ([place]) 06/24/2007   DYSTHYMIA 06/24/2007   Venous (peripheral) insufficiency 06/24/2007   Osteoarthritis 06/24/2007   UTERINE CANCER, HX OF 06/24/2007   Obstructive sleep apnea 02/28/2007   COPD mixed type ([place]) 02/28/2007   GERD 02/28/2007   Irritable bowel syndrome 02/28/2007    Conditions to be addressed/monitored: CKD Stage 4. Housing Barriers, Level of Care Concerns, ADL/IADL Limitations, Social Isolation, Limited Access to Caregiver, and Lacks Knowledge of [employer].  Care Plan : LCSW Plan of Care  Updates made by Francis Gaines, LCSW since 10/03/2020 12:00 AM     Problem: Find Help with Senior Housing in My Community.   Priority: High     Long-Range Goal: Find Help with Senior Housing in  My Community.   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 Limited social support and family discord with all 4 siblings regarding [employer] and family owned property where patient currently resides. Limited access to caregiver. Lacks knowledge of available housing agencies and resources in [place] and [place]. Clinical Goals:  Patient will work with LCSW over the next 90 days to address needs related to senior housing. Clinical Interventions:  Patient interviewed and appropriate assessments performed. Collaboration with Primary Care Physician, Dr. Lelon Frohlich regarding development and update of comprehensive plan of care as evidenced by provider attestation and co-signature. Inter-disciplinary care team collaboration (see longitudinal plan of care). Assessment of needs, barriers, agencies contacted, as  well as how impacting. Reviewed various resources and discussed options with patient. Provided patient with information about all available senior housing resources in [place] and [place]. Discussed plans with patient for ongoing care management follow-up and provided patient with direct contact information for care management team. Mailed patient a list of all senior housing resources in [place] and [place] and advised patient to begin calling agencies of interest. Other Clinical Interventions: PHQ 2 and PHQ 9 Depression Screen performed and results reviewed with patient, Solution-Focused Strategies implemented, Deep Breathing Exercises, Relaxation Techniques and Mindfulness  Meditation Strategies encouraged on a daily basis, Active Listening/Reflection utilized, Emotional Support provided, Problem Solving/Task Centered Skills utilized, Motivational Interviewing performed, Quality of Sleep assessed and Sleep Hygiene Techniques promoted, Increase in Actives/Exercise encouraged, Verbalization of Feelings encouraged, Crisis Resource Education/Information provided, and Suicidal Ideation/Homicidal Ideation assessed - None Present. Patient Goals/Self-Care Activities: Work with [employer], on a weekly basis, to obtain senior housing assistance. Consider applying for [employer] or Section 8 Housing, through the [employer]. [place] for Seniors [phone]) to check pricing and availability.   [place] for Seniors [phone]) to check pricing and availability. [place] for Seniors [phone]) to check pricing and availability. [place] for Seniors [phone]) to check pricing and availability. [place] for [facility]iors [phone]) to check pricing and availability. Contact [facility] [place] [facility] [phone]) to check pricing and availability. Contact LCSW directly (# [phone]) if you have questions, need assistance with application completion and/or submission, or if additional social work needs are identified before LCSW next scheduled outreach call. Follow-Up Date:  10/10/2020 at 10:00am.      Follow-Up Plan:  LCSW will follow-up with patient by phone on 10/10/2020 at 10:00am.      Nat Christen LCSW Licensed Clinical Social Worker [place] [phone]

## 2020-10-03 NOTE — Telephone Encounter (Signed)
Dr. Vaughan Browner, can you please advise on the morphine? I can see that it was ordered, but it was ordered as a clinic administered medication.

## 2020-10-03 NOTE — Patient Instructions (Signed)
Visit Information:  Good morning Ms. Seidell, thank you for reaching out to me to explain that you have not had an opportunity to place calls or review resource information pertaining to housing.  I pray that your week gets better and I hope that an additional weeks worth of time will allow you the ability.  Please notify me if you have any questions or concerns; otherwise, I will follow-up with you again next week.  Take care, Di Kindle  PATIENT GOALS:  Goals Addressed               This Visit's Progress     Find Help with Senior Housing in My Community. (pt-stated)   Not on track     Timeframe:  Long-Range Goal Priority:  High Start Date:  09/09/2020                      Expected End Date:   12/10/2020            Follow-Up Date:  10/10/2020 at 10:00am.   Patient Goals/Self-Care Activities: Work with LCSW, on a weekly basis, to obtain senior housing assistance. Consider applying for [employer] or Section 8 Housing, through the [employer]. [place] for Seniors [phone]) to check pricing and availability.   [place] for Seniors [phone]) to check pricing and availability. [place] for Seniors [phone]) to check pricing and availability. [place] for [facility]iors [phone]) to check pricing and availability. [place] for Seniors [phone]) to check pricing and availability. Contact [facility] [place] [facility] [phone]) to check pricing and availability. Contact LCSW directly (# [phone]) if you have questions, need assistance with application completion and/or submission, or if additional social work needs are identified before LCSW next scheduled outreach call.        Patient verbalizes understanding of  instructions provided today and agrees to view in [place].   Telephone follow-up appointment with care management team member scheduled for: 10/10/2020 at 10:00am.  Nat Christen LCSW Licensed Clinical Social Worker [place] [phone]

## 2020-10-07 MED ORDER — MORPHINE SULFATE 20 MG/5ML PO SOLN: 5.0000 mg | ORAL | 0 refills | Status: AC | PRN

## 2020-10-07 NOTE — Telephone Encounter (Signed)
Apologize for the confusion. I have sent the correct order to the pharmacy

## 2020-10-07 NOTE — Telephone Encounter (Signed)
Spoke with the pt  She is aware that the morphine was sent  She asked again about [place] calling [employer] to get her meals 2 x per wk for 12 wks  I called the number she provided in the [place] below and spoke with [facility]  I gave the code that the pt provided and was advised pt does not qualify bc referral has to be by a Chief Financial Officer after a hospital stay  Pt aware and I advised to her please call back if there is anything further we could do to help

## 2020-10-08 ENCOUNTER — Telehealth: Payer: Self-pay | Admitting: Pulmonary Disease

## 2020-10-08 NOTE — Telephone Encounter (Signed)
Patient calling. States needs more oxygen tanks. Has only tank left. Patient states not able to use concentrator. Patient phone number is [phone].

## 2020-10-08 NOTE — Telephone Encounter (Signed)
Called and spoke with patient. She stated that she has been using 8L of O2 for the past few weeks due to her stage 4 COPD. She does have a concentrator at home but it only goes up to 5L. She was using 8L during her O2 with Dr. Vaughan Browner last week. She is concerned about using the home concentrator because she lives in a 2 story home and with a roommate. She is scared that her or the roommate will trip over the cords in the house. She is requesting more oxygen tanks.    Since she is not using the concentrator, she has been using actual tanks. Per [facility], she is receiving 20 tanks every 2 weeks. Estill B[place]berg stated that [facility] can not provide her with anymore tanks. [facility] B[facility]berg stated that the order they received earlier this year states she is using 5L of O2 with exertion and 3L of O2 at night. They were unaware of her using 8L of O2. [facility] B[facility] stated that if they receive qualifying stats and order, they will be able to provide her with a 10L concentrator but she will not receive any more tanks.   Dr. Vaughan Browner, can you please advise? Thanks.

## 2020-10-09 ENCOUNTER — Other Ambulatory Visit: Payer: Self-pay | Admitting: Pulmonary Disease

## 2020-10-09 DIAGNOSIS — J449 Chronic obstructive pulmonary disease, unspecified: Secondary | ICD-10-CM

## 2020-10-10 ENCOUNTER — Ambulatory Visit: Payer: Medicare HMO | Admitting: *Deleted

## 2020-10-10 DIAGNOSIS — M170 Bilateral primary osteoarthritis of knee: Secondary | ICD-10-CM

## 2020-10-10 DIAGNOSIS — J9611 Chronic respiratory failure with hypoxia: Secondary | ICD-10-CM

## 2020-10-10 DIAGNOSIS — J449 Chronic obstructive pulmonary disease, unspecified: Secondary | ICD-10-CM

## 2020-10-10 DIAGNOSIS — M4722 Other spondylosis with radiculopathy, cervical region: Secondary | ICD-10-CM

## 2020-10-10 DIAGNOSIS — M8949 Other hypertrophic osteoarthropathy, multiple sites: Secondary | ICD-10-CM

## 2020-10-10 DIAGNOSIS — N1832 Chronic kidney disease, stage 3b: Secondary | ICD-10-CM

## 2020-10-10 DIAGNOSIS — M159 Polyosteoarthritis, unspecified: Secondary | ICD-10-CM

## 2020-10-10 DIAGNOSIS — F332 Major depressive disorder, recurrent severe without psychotic features: Secondary | ICD-10-CM

## 2020-10-10 NOTE — Chronic Care Management (AMB) (Signed)
Chronic Care Management    Clinical Social Work Note  10/10/2020 Name: Rebecca [name] MRN: [id number] DOB: 11/25/1950  Rebecca [name] is a 70 y.o. year old female who is a primary care patient of Isaac Bliss, Rayford Halsted, MD. The CCM team was consulted to assist the patient with chronic disease management and/or care coordination needs related to: Level of Care Concerns.   Engaged with patient by telephone for follow-up visit in response to provider referral for social work chronic care management and care coordination services.   Consent to Services:  The patient was given information about Chronic Care Management services, agreed to services, and gave verbal consent prior to initiation of services.  Please see initial visit note for detailed documentation.   Patient agreed to services and consent obtained.   Assessment: Review of patient past medical history, allergies, medications, and health status, including review of relevant consultants reports was performed today as part of a comprehensive evaluation and provision of chronic care management and care coordination services.     SDOH (Social Determinants of Health) assessments and interventions performed:    Advanced Directives Status: Not addressed in this encounter.  CCM Care Plan  Allergies  Allergen Reactions   Keflex [Cephalexin] Diarrhea   Penicillins Nausea Only and Nausea And Vomiting    REACTION: nausea and vomiting REACTION: nausea and vomiting REACTION: nausea and vomiting    Outpatient Encounter Medications as of 10/10/2020  Medication Sig   albuterol (VENTOLIN HFA) 108 (90 Base) MCG/ACT inhaler INHALE TWO PUFFS INTO THE LUNGS EVERY 6 HOURS AS NEEDED FOR WHEEZING OR SHORTNESS OF BREATH   ALPRAZolam (XANAX) 0.5 MG tablet TAKE ONE TABLET BY MOUTH THREE TIMES A DAY AS NEEDED FOR NERVES  --- MAXIMUM 3 TABLETS ---   APPLE CIDER VINEGAR PO Take 1,000 mg by mouth daily.   Ascorbic Acid (VITAMIN C) 1000 MG  tablet Take 1,000 mg by mouth daily.   azithromycin (ZITHROMAX) 250 MG tablet TAKE ONE TABLET BY MOUTH DAILY   cetirizine (ZYRTEC) 10 MG tablet Take 10 mg by mouth daily.   Cholecalciferol (VITAMIN D-3) 125 MCG (5000 UT) TABS Take 1 tablet by mouth daily. Alternate 1k every other day with 2k   CINNAMON PO Take 2,000 mg by mouth every morning. Takes 2 every am   colchicine 0.6 MG tablet Take 1 tablet (0.6 mg total) by mouth daily.   furosemide (LASIX) 40 MG tablet TAKE ONE TO TWO TABLETS BY MOUTH DAILY AS NEEDED FOR SWELLING   glucosamine-chondroitin 500-400 MG tablet Take 2 tablets by mouth 2 (two) times daily.    GUAIFENESIN ER PO Take 400 mg by mouth 2 (two) times daily. Tales 3 tab;es twice a day   ipratropium-albuterol (DUONEB) 0.5-2.5 (3) MG/3ML SOLN INHALE 1 VIAL VIA NEBULIZATION THREE TIMES A DAY   Magnesium 400 MG CAPS Take 3 capsules by mouth 2 (two) times daily.    methylPREDNISolone (MEDROL) 2 MG tablet Take '10mg'$  daily   Misc Natural Products (TART CHERRY ADVANCED PO) Take by mouth.   MITIGARE 0.6 MG CAPS TAKE ONE CAPSULE BY MOUTH TWICE A DAY   morphine 20 MG/5ML solution Take 1.3 mLs (5.2 mg total) by mouth every 4 (four) hours as needed (dyspnea).   OVER THE COUNTER MEDICATION CBD Oil   OXYGEN Inhale 4 L into the lungs.   sertraline (ZOLOFT) 100 MG tablet Take 1 tablet (100 mg total) by mouth daily.   sertraline (ZOLOFT) 50 MG tablet TAKE ONE TABLET BY MOUTH  DAILY   sodium chloride HYPERTONIC 3 % nebulizer solution Take by nebulization 2 (two) times daily.   Spacer/Aero-Holding Chambers (AEROCHAMBER MV) inhaler Use as instructed   TRELEGY ELLIPTA 100-62.5-25 MCG/INH AEPB INHALE ONE PUFF BY MOUTH DAILY   TURMERIC PO Take 1,000 mg by mouth 2 (two) times daily. Takes 1,000 mg   UNABLE TO FIND Med Name: Mullien OTC   vitamin A 10000 UNIT capsule Take 5,000 Units by mouth daily. Per pt list - takes 25,000   No facility-administered encounter medications on file as of 10/10/2020.     Patient Active Problem List   Diagnosis Date Noted   Bradycardia with 31-40 beats per minute 01/04/2020   Takes dietary supplements 04/26/2018   DNR (do not resuscitate) 04/05/2018   CKD (chronic kidney disease) stage 3, GFR 30-59 ml/min ([place]) 123456   Umbilical hernia without obstruction and without gangrene 01/18/2018   Degenerative arthritis of knee, bilateral 05/13/2017   Osteoarthritis of spine with radiculopathy, cervical region 07/31/2016   Nocturnal leg cramps 99991111   Diastolic dysfunction 123XX123   Pain, joint, ankle, left 07/10/2014   Olecranon bursitis of right elbow 05/17/2014   Left arm pain 03/10/2014   Biceps tendinitis on left 03/10/2014   Neck pain of over 3 months duration 03/10/2014   Chronic respiratory failure ([place]) 02/15/2014   Chest pressure 05/25/2013   Impaired glucose tolerance 12/08/2011   Chest pain, atypical 01/14/2011   Asthma with exacerbation 04/11/2009   Vitamin D deficiency 01/21/2009   Hypothyroidism XX123456   METABOLIC SYNDROME X XX123456   Morbid obesity ([place]) 06/24/2007   DYSTHYMIA 06/24/2007   Venous (peripheral) insufficiency 06/24/2007   Osteoarthritis 06/24/2007   UTERINE CANCER, HX OF 06/24/2007   Obstructive sleep apnea 02/28/2007   COPD mixed type ([place]) 02/28/2007   GERD 02/28/2007   Irritable bowel syndrome 02/28/2007    Conditions to be addressed/monitored: COPD and Pulmonary Disease.  Housing Barriers, Level of Care Concerns, Limited Access to Caregiver, and Lacks Knowledge of [employer].  Care Plan : LCSW Plan of Care  Updates made by Francis Gaines, LCSW since 10/10/2020 12:00 AM     Problem: Find Help with Senior Housing in My Community.   Priority: High     Long-Range Goal: Find Help with Senior Housing in My Community.   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 Limited social support and family discord with all 4 siblings regarding [employer] and family owned property where patient currently resides. Limited access to caregiver. Lacks knowledge of available housing agencies and resources in [place] and [place]. Clinical Goals:  Patient will work with LCSW over the next 90 days to address needs related to senior housing. Clinical Interventions:  Patient interviewed and appropriate assessments performed. Collaboration with Primary Care Physician, Dr. Lelon Frohlich regarding development and update of comprehensive plan of care as evidenced by provider attestation and co-signature. Inter-disciplinary care team collaboration (see longitudinal plan of care). Assessment of needs, barriers, agencies contacted, as well as how impacting. Reviewed various resources and discussed options with patient. Provided patient with information about all available senior housing resources in [place] and [place]. Discussed plans with  patient for ongoing care management follow-up and provided patient with direct contact information for care management team. Mailed patient a list of all senior housing resources in [place] and [place] and advised patient to begin calling agencies of interest. Other Clinical Interventions: PHQ 2 and PHQ 9 Depression Screen performed and results reviewed with patient, Solution-Focused Strategies implemented, Deep Breathing Exercises, Relaxation Techniques and Mindfulness Meditation Strategies encouraged on a daily basis, Active Listening/Reflection utilized, Emotional Support provided, Problem Solving/Task Centered Skills utilized, Motivational Interviewing  performed, Quality of Sleep assessed and Sleep Hygiene Techniques promoted, Increase in Actives/Exercise encouraged, Verbalization of Feelings encouraged, Crisis Resource Education/Information provided, and Suicidal Ideation/Homicidal Ideation assessed - None Present. Referral placed to [facility] for [place]. Secure chat message sent to Pulmonologist, Praveen Mannam to request assistance with obtaining portable oxygen tanks for home use. Patient Goals/Self-Care Activities: Work with [employer], on a weekly basis, to obtain senior housing assistance. Consider applying for [employer] or Section 8 Housing, through the [employer]. Continue outreach attempts to [employer] for Seniors [phone]) to check pricing and availability.   Continue outreach attempts to [place] for Seniors [phone]) to check pricing and availability. Continue outreach attempts to [employer] for Seniors [phone]) to check pricing and availability. LCSW will contact [employer] for Seniors [phone]) to check pricing and availability. LCSW will contact [place] for Seniors [phone]) to check pricing and availability. LCSW will contact [facility] [employer] [facility] [phone]) to check pricing and availability. Contact LCSW directly (# [phone]) if you have questions, need assistance with application completion and/or submission, or if additional social work needs are identified. Secure chat message sent to Pulmonologist, Praveen Mannam to request assistance with obtaining portable oxygen tanks for home use. Begin receiving nursing and social work services in the home through [place] with Manufacturing engineer on 10/31/2020. Follow-Up Date:  10/24/2020 at  11:30am      Follow-Up Plan:  10/24/2020 at 11:30am      [place] Licensed Clinical Social Worker [place] [phone]

## 2020-10-10 NOTE — Patient Instructions (Signed)
Visit Information  PATIENT GOALS:  Goals Addressed               This Visit's Progress     Find Help with Senior Housing in My Community. (pt-stated)   On track     Timeframe:  Long-Range Goal Priority:  High Start Date:  09/09/2020                      Expected End Date:   12/10/2020            Follow-Up Date:  10/24/2020 at 11:30am   Patient Goals/Self-Care Activities: Work with LCSW, on a weekly basis, to obtain senior housing assistance. Consider applying for [employer] or Section 8 Housing, through the [employer]. Continue outreach attempts to [employer] [facility] [phone]) to check pricing and availability.   Continue outreach attempts to [place] [facility] [phone]) to check pricing and availability. Continue outreach attempts to [employer] for [facility] [phone]) to check pricing and availability. LCSW will contact [employer] for Seniors [phone]) to check pricing and availability. LCSW will contact [place] for [facility]iors [phone]) to check pricing and availability. LCSW will contact [facility] [place] [facility] [phone]) to check pricing and availability. Contact LCSW directly (# [phone]) if you have questions, need assistance with application completion and/or submission, or if additional social work needs are identified. Secure chat message sent to Pulmonologist, Praveen Mannam to request assistance with obtaining portable oxygen tanks for home use. Begin receiving nursing and social work services in the home through [place] with Manufacturing engineer on 10/31/2020.        Patient verbalizes understanding of instructions provided today and agrees to view in [place].   Telephone follow-up appointment with care  management team member scheduled for:  10/24/2020 at 11:30am  [place] Licensed Clinical Social Worker [place] [phone]

## 2020-10-14 NOTE — Telephone Encounter (Signed)
Left message for patient to call back  

## 2020-10-14 NOTE — Telephone Encounter (Signed)
Ok. Please send in an order for 10 L O2 concentrator

## 2020-10-15 NOTE — Telephone Encounter (Signed)
Called and spoke to pt. Pt states she feels she is doing very well. Pt thinks the medrol has significantly improved her breathing. Pt states she has been using her pulse/ox to monitor her sats while titrating her O2. Pt states she stays above 90% when at rest on RA. Pt is now only needing a max of 5lpm when active. Pt doesn't need a larger concentrator at this time.    Will forward to Dr. Vaughan Browner as Juluis Rainier as order was not placed.

## 2020-10-24 ENCOUNTER — Ambulatory Visit: Payer: Medicare HMO | Admitting: *Deleted

## 2020-10-24 DIAGNOSIS — J449 Chronic obstructive pulmonary disease, unspecified: Secondary | ICD-10-CM

## 2020-10-24 DIAGNOSIS — J9611 Chronic respiratory failure with hypoxia: Secondary | ICD-10-CM

## 2020-10-24 DIAGNOSIS — M170 Bilateral primary osteoarthritis of knee: Secondary | ICD-10-CM

## 2020-10-24 NOTE — Patient Instructions (Addendum)
Visit Information  PATIENT GOALS:  Goals Addressed               This Visit's Progress     Find Help with Senior Housing in My Community. (pt-stated)   On track     Timeframe:  Long-Range Goal Priority:  High Start Date:  09/09/2020                      Expected End Date:   12/10/2020            Follow-Up Date:  11/05/2020 at 2:00pm   Patient Goals/Self-Care Activities: Work with LCSW on a weekly/bi-weekly basis, to obtain assistance with arranging senior housing. Request that your [place], Pearla Dubonnet with Melodie Bouillon Attorney at [place], contact LCSW directly to discuss finances for long-term care assisted living facility placement. Begin receiving nursing and social work services in the home through [place] with Manufacturing engineer on 10/31/2020.        Patient verbalizes understanding of instructions provided today and agrees to view in [place].   Telephone follow-up appointment with care management team member scheduled for:  10/28/2020 at 3:00pm  [place] Licensed Clinical Social Worker [place] [phone]

## 2020-10-24 NOTE — Chronic Care Management (AMB) (Signed)
Chronic Care Management    Clinical Social Work Note  10/24/2020 Name: Rebecca [name] MRN: [id number] DOB: 30-Oct-1950  Rebecca [name] is a 70 y.o. year old female who is a primary care patient of Rebecca [name], Rebecca Halsted, MD. The CCM team was consulted to assist the patient with chronic disease management and/or care coordination needs related to: Level of Care Concerns.   Engaged with patient by telephone for follow-up visit in response to provider referral for social work chronic care management and care coordination services.   Consent to Services:  The patient was given information about Chronic Care Management services, agreed to services, and gave verbal consent prior to initiation of services.  Please see initial visit note for detailed documentation.   Patient agreed to services and consent obtained.   Assessment: Review of patient past medical history, allergies, medications, and health status, including review of relevant consultants reports was performed today as part of a comprehensive evaluation and provision of chronic care management and care coordination services.     SDOH (Social Determinants of Health) assessments and interventions performed:    Advanced Directives Status: Not addressed in this encounter.  CCM Care Plan  Allergies  Allergen Reactions   Keflex [Cephalexin] Diarrhea   Penicillins Nausea Only and Nausea And Vomiting    REACTION: nausea and vomiting REACTION: nausea and vomiting REACTION: nausea and vomiting    Outpatient Encounter Medications as of 10/24/2020  Medication Sig   albuterol (VENTOLIN HFA) 108 (90 Base) MCG/ACT inhaler INHALE TWO PUFFS INTO THE LUNGS EVERY 6 HOURS AS NEEDED FOR WHEEZING OR SHORTNESS OF BREATH   ALPRAZolam (XANAX) 0.5 MG tablet TAKE ONE TABLET BY MOUTH THREE TIMES A DAY AS NEEDED FOR NERVES  --- MAXIMUM 3 TABLETS ---   APPLE CIDER VINEGAR PO Take 1,000 mg by mouth daily.   Ascorbic Acid (VITAMIN C) 1000 MG  tablet Take 1,000 mg by mouth daily.   azithromycin (ZITHROMAX) 250 MG tablet TAKE ONE TABLET BY MOUTH DAILY   cetirizine (ZYRTEC) 10 MG tablet Take 10 mg by mouth daily.   Cholecalciferol (VITAMIN D-3) 125 MCG (5000 UT) TABS Take 1 tablet by mouth daily. Alternate 1k every other day with 2k   CINNAMON PO Take 2,000 mg by mouth every morning. Takes 2 every am   colchicine 0.6 MG tablet Take 1 tablet (0.6 mg total) by mouth daily.   furosemide (LASIX) 40 MG tablet TAKE ONE TO TWO TABLETS BY MOUTH DAILY AS NEEDED FOR SWELLING   glucosamine-chondroitin 500-400 MG tablet Take 2 tablets by mouth 2 (two) times daily.    GUAIFENESIN ER PO Take 400 mg by mouth 2 (two) times daily. Tales 3 tab;es twice a day   ipratropium-albuterol (DUONEB) 0.5-2.5 (3) MG/3ML SOLN INHALE 1 VIAL VIA NEBULIZATION THREE TIMES A DAY   Magnesium 400 MG CAPS Take 3 capsules by mouth 2 (two) times daily.    methylPREDNISolone (MEDROL) 2 MG tablet Take '10mg'$  daily   Misc Natural Products (TART CHERRY ADVANCED PO) Take by mouth.   MITIGARE 0.6 MG CAPS TAKE ONE CAPSULE BY MOUTH TWICE A DAY   morphine 20 MG/5ML solution Take 1.3 mLs (5.2 mg total) by mouth every 4 (four) hours as needed (dyspnea).   OVER THE COUNTER MEDICATION CBD Oil   OXYGEN Inhale 4 L into the lungs.   sertraline (ZOLOFT) 100 MG tablet Take 1 tablet (100 mg total) by mouth daily.   sertraline (ZOLOFT) 50 MG tablet TAKE ONE TABLET BY MOUTH  DAILY   sodium chloride HYPERTONIC 3 % nebulizer solution Take by nebulization 2 (two) times daily.   Spacer/Aero-Holding Chambers (AEROCHAMBER MV) inhaler Use as instructed   TRELEGY ELLIPTA 100-62.5-25 MCG/INH AEPB INHALE ONE PUFF BY MOUTH DAILY   TURMERIC PO Take 1,000 mg by mouth 2 (two) times daily. Takes 1,000 mg   UNABLE TO FIND Med Name: Mullien OTC   vitamin A 10000 UNIT capsule Take 5,000 Units by mouth daily. Per pt list - takes 25,000   No facility-administered encounter medications on file as of 10/24/2020.     Patient Active Problem List   Diagnosis Date Noted   Bradycardia with 31-40 beats per minute 01/04/2020   Takes dietary supplements 04/26/2018   DNR (do not resuscitate) 04/05/2018   CKD (chronic kidney disease) stage 3, GFR 30-59 ml/min ([place]) 123456   Umbilical hernia without obstruction and without gangrene 01/18/2018   Degenerative arthritis of knee, bilateral 05/13/2017   Osteoarthritis of spine with radiculopathy, cervical region 07/31/2016   Nocturnal leg cramps 99991111   Diastolic dysfunction 123XX123   Pain, joint, ankle, left 07/10/2014   Olecranon bursitis of right elbow 05/17/2014   Left arm pain 03/10/2014   Biceps tendinitis on left 03/10/2014   Neck pain of over 3 months duration 03/10/2014   Chronic respiratory failure ([place]) 02/15/2014   Chest pressure 05/25/2013   Impaired glucose tolerance 12/08/2011   Chest pain, atypical 01/14/2011   Asthma with exacerbation 04/11/2009   Vitamin D deficiency 01/21/2009   Hypothyroidism XX123456   METABOLIC SYNDROME X [id number]   Morbid obesity ([place]) 06/24/2007   DYSTHYMIA 06/24/2007   Venous (peripheral) insufficiency 06/24/2007   Osteoarthritis 06/24/2007   UTERINE CANCER, HX OF 06/24/2007   Obstructive sleep apnea 02/28/2007   COPD mixed type ([place]) 02/28/2007   GERD 02/28/2007   Irritable bowel syndrome 02/28/2007    Conditions to be addressed/monitored: COPD and CKD Stage IIII.  Level of Care Concerns, Limited Access to Caregiver, and Lacks Knowledge of [employer].  Care Plan : LCSW Plan of Care  Updates made by Rebecca Gaines, LCSW since 10/24/2020 12:00 AM     Problem: Find Help with Senior Housing in My Community.   Priority: High     Long-Range Goal: Find Help with Senior Housing in My Community.   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 Limited social support, limited access to caregiver and family discord with all 4 siblings regarding [employer] and family owned property where patient currently resides. Clinical Goals:  Patient will work with LCSW to address needs related to senior housing. Clinical Interventions:  Patient interviewed and appropriate assessments performed. Collaboration with Primary Care Physician, Dr. Lelon Frohlich regarding development and update of comprehensive plan of care as evidenced by provider attestation and co-signature. Inter-disciplinary care team collaboration (see longitudinal plan of care). Assessment of needs, barriers, agencies contacted, as well as how impacting. Provided patient with information about all available senior housing resources in [place] and [place]. Discussed plans with patient for ongoing care management follow-up and provided patient with direct contact information for care management team. Other Clinical Interventions: PHQ 2 and PHQ 9 Depression Screen performed  and results reviewed with patient, Solution-Focused Strategies implemented, Deep Breathing Exercises, Relaxation Techniques and Mindfulness Meditation Strategies encouraged on a daily basis, Active Listening/Reflection utilized, Emotional Support provided, Problem Solving/Task Centered Skills utilized, Motivational Interviewing performed, Quality of Sleep assessed and Sleep Hygiene Techniques promoted, Increase in Actives/Exercise encouraged, Verbalization of Feelings encouraged, Crisis Resource Education/Information provided, and Suicidal Ideation/Homicidal Ideation assessed - None Present. Patient Goals/Self-Care Activities: Work with LCSW on a weekly/bi-weekly basis,  to obtain assistance with arranging senior housing. Request that your [place], Pearla Dubonnet with Melodie Bouillon Attorney at [place], contact LCSW directly to discuss finances for long-term care assisted living facility placement. Begin receiving nursing and social work services in the home through [place] with Manufacturing engineer on 10/31/2020. Follow-Up Date:  10/28/2020 at 3:00pm      Follow-Up Plan:  10/28/2020 at 3:00pm      [place] Licensed Clinical Social Worker [place] [phone]

## 2020-10-28 ENCOUNTER — Telehealth: Payer: Medicare HMO

## 2020-10-29 ENCOUNTER — Ambulatory Visit (INDEPENDENT_AMBULATORY_CARE_PROVIDER_SITE_OTHER): Payer: Medicare HMO | Admitting: Internal Medicine

## 2020-10-29 ENCOUNTER — Encounter: Payer: Self-pay | Admitting: Internal Medicine

## 2020-10-29 VITALS — Wt 250.0 lb

## 2020-10-29 DIAGNOSIS — L282 Other prurigo: Secondary | ICD-10-CM | POA: Diagnosis not present

## 2020-10-29 DIAGNOSIS — J449 Chronic obstructive pulmonary disease, unspecified: Secondary | ICD-10-CM | POA: Diagnosis not present

## 2020-10-29 DIAGNOSIS — R252 Cramp and spasm: Secondary | ICD-10-CM

## 2020-10-29 DIAGNOSIS — Z87440 Personal history of urinary (tract) infections: Secondary | ICD-10-CM

## 2020-10-29 MED ORDER — HYDROCODONE-ACETAMINOPHEN 5-325 MG PO TABS: 1.0000 | ORAL_TABLET | Freq: Four times a day (QID) | ORAL | 0 refills | Status: AC | PRN

## 2020-10-29 NOTE — Progress Notes (Signed)
Virtual Visit via Video Note  I connected with Rebecca [name] on 10/29/20 at 10:30 AM EDT by a video enabled telemedicine application and verified that I am speaking with the correct person using two identifiers.  Location patient: home Location provider: work office Persons participating in the virtual visit: patient, provider  I discussed the limitations of evaluation and management by telemedicine and the availability of in person appointments. The patient expressed understanding and agreed to proceed.   HPI: She has scheduled this visit to discuss some issues:  1.  She saw her pulmonologist in September.  She is very discouraged after this visit.  She was told that at this time her COPD is very advanced and the best they could do is manage her air hunger.  She was started on daily Medrol 10 mg, palliative care consult was recommended which is happening next week.  They recommended she use morphine as needed but she has not even filled from the pharmacy.  She has felt significant improvement since being on Medrol.  She is requesting 20 tablets of hydrocodone to take as needed for pain.  Her last refill of 20 tablets was in November 2021.  2.  She has been having bilateral hand cramping, has been taking Aleve as needed.   ROS: Constitutional: Denies fever, chills, diaphoresis, appetite change. HEENT: Denies photophobia, eye pain, redness, hearing loss, ear pain, congestion, sore throat, rhinorrhea, sneezing, mouth sores, trouble swallowing, neck pain, neck stiffness and tinnitus.   Respiratory: Denies cough, chest tightness,  and wheezing.   Cardiovascular: Denies chest pain, palpitations and leg swelling.  Gastrointestinal: Denies nausea, vomiting, abdominal pain, diarrhea, constipation, blood in stool and abdominal distention.  Genitourinary: Denies dysuria, urgency, frequency, hematuria, flank pain and difficulty urinating.  Endocrine: Denies: hot or cold intolerance,  sweats, changes in hair or nails, polyuria, polydipsia. Musculoskeletal: Denies myalgias, back pain, joint swelling, arthralgias and gait problem.  Skin: Denies pallor, rash and wound.  Neurological: Denies dizziness, seizures, syncope, weakness, light-headedness, numbness and headaches.  Hematological: Denies adenopathy. Easy bruising, personal or family bleeding history  Psychiatric/Behavioral: Denies suicidal ideation, mood changes, confusion, nervousness, sleep disturbance and agitation   Past Medical History:  Diagnosis Date   Anxiety    Asthma    Back pain    Bilateral swelling of feet    Chest pain    Chest pain    Chewing difficulty    Chronic venous insufficiency    COPD (chronic obstructive pulmonary disease) ([name])    Depression    DJD (degenerative joint disease)    Drug use    Dysthymia    Emphysema/COPD ([name])    Fatty liver    Fibromyalgia    Gallbladder problem    GERD (gastroesophageal reflux disease)    Gout    History of headache    Hodgkin lymphoma of extranodal or solid organ site ([place])    Hypothyroid    IBS (irritable bowel syndrome)    Infertility, female    Joint pain    Kidney problem    Metabolic syndrome X    Morbid obesity ([name])    OSA (obstructive sleep apnea)    Osteoarthritis    SOB (shortness of breath)    Swallowing difficulty    Vitamin B12 deficiency    Vitamin D deficiency     Past Surgical History:  Procedure Laterality Date   APPENDECTOMY     BILATERAL SALPINGOOPHORECTOMY  2007   [place]  LAPAROSCOPIC GASTRIC BANDING  08/2007   Dr. Hassell Done   LAPAROSCOPIC HYSTERECTOMY  2007   [facility]   SPLENECTOMY  at [facility] 8   d/t trauma    Family History  Problem Relation Age of Onset   Heart failure Father    Cancer Father    Parkinson's disease Mother    Endometrial cancer Mother    Thyroid disease Mother    Cancer Mother    Obesity Mother    Heart attack Sister    Diabetes Mellitus II Brother      SOCIAL HX:   reports that she quit smoking about 29 years ago. Her smoking use included cigarettes. She started smoking about 58 years ago. She has a 29.00 pack-year smoking history. She has never used smokeless tobacco. She reports that she does not drink alcohol and does not use drugs.   Current Outpatient Medications:    albuterol (VENTOLIN HFA) 108 (90 Base) MCG/ACT inhaler, INHALE TWO PUFFS INTO THE LUNGS EVERY 6 HOURS AS NEEDED FOR WHEEZING OR SHORTNESS OF BREATH, Disp: 18 g, Rfl: 11   ALPRAZolam (XANAX) 0.5 MG tablet, TAKE ONE TABLET BY MOUTH THREE TIMES A DAY AS NEEDED FOR NERVES  --- MAXIMUM 3 TABLETS ---, Disp: 90 tablet, Rfl: 0   APPLE CIDER VINEGAR PO, Take 1,000 mg by mouth daily., Disp: , Rfl:    Ascorbic Acid (VITAMIN C) 1000 MG tablet, Take 1,000 mg by mouth daily., Disp: , Rfl:    cetirizine (ZYRTEC) 10 MG tablet, Take 10 mg by mouth daily., Disp: , Rfl:    Cholecalciferol (VITAMIN D-3) 125 MCG (5000 UT) TABS, Take 1 tablet by mouth daily. Alternate 1k every other day with 2k, Disp: 30 tablet, Rfl:    CINNAMON PO, Take 2,000 mg by mouth every morning. Takes 2 every am, Disp: , Rfl:    colchicine 0.6 MG tablet, Take 1 tablet (0.6 mg total) by mouth daily., Disp: 30 tablet, Rfl: 0   furosemide (LASIX) 40 MG tablet, TAKE ONE TO TWO TABLETS BY MOUTH DAILY AS NEEDED FOR SWELLING, Disp: 180 tablet, Rfl: 1   glucosamine-chondroitin 500-400 MG tablet, Take 2 tablets by mouth 2 (two) times daily. , Disp: , Rfl:    GUAIFENESIN ER PO, Take 400 mg by mouth 2 (two) times daily. Tales 3 tab;es twice a day, Disp: , Rfl:    HYDROcodone-acetaminophen (NORCO/VICODIN) 5-325 MG tablet, Take 1 tablet by mouth every 6 (six) hours as needed for moderate pain., Disp: 20 tablet, Rfl: 0   ipratropium-albuterol (DUONEB) 0.5-2.5 (3) MG/3ML SOLN, INHALE 1 VIAL VIA NEBULIZATION THREE TIMES A DAY, Disp: 270 mL, Rfl: 5   Magnesium 400 MG CAPS, Take 3 capsules by mouth 2 (two) times daily. , Disp: , Rfl:     methylPREDNISolone (MEDROL) 2 MG tablet, Take '10mg'$  daily, Disp: 150 tablet, Rfl: 5   Misc Natural Products (TART CHERRY ADVANCED PO), Take by mouth., Disp: , Rfl:    MITIGARE 0.6 MG CAPS, TAKE ONE CAPSULE BY MOUTH TWICE A DAY, Disp: 60 capsule, Rfl: 2   morphine 20 MG/5ML solution, Take 1.3 mLs (5.2 mg total) by mouth every 4 (four) hours as needed (dyspnea)., Disp: 30 mL, Rfl: 0   OVER THE COUNTER MEDICATION, CBD Oil, Disp: , Rfl:    OXYGEN, Inhale 4 L into the lungs., Disp: , Rfl:    sertraline (ZOLOFT) 100 MG tablet, Take 1 tablet (100 mg total) by mouth daily., Disp: 90 tablet, Rfl: 1   sertraline (ZOLOFT) 50 MG  tablet, TAKE ONE TABLET BY MOUTH DAILY, Disp: 90 tablet, Rfl: 0   sodium chloride HYPERTONIC 3 % nebulizer solution, Take by nebulization 2 (two) times daily., Disp: 750 mL, Rfl: 12   Spacer/Aero-Holding Chambers (AEROCHAMBER MV) inhaler, Use as instructed, Disp: 1 each, Rfl: 0   TRELEGY ELLIPTA 100-62.5-25 MCG/INH AEPB, INHALE ONE PUFF BY MOUTH DAILY, Disp: 60 each, Rfl: 3   TURMERIC PO, Take 1,000 mg by mouth 2 (two) times daily. Takes 1,000 mg, Disp: , Rfl:    UNABLE TO FIND, Med Name: Mullien OTC, Disp: , Rfl:    vitamin A 10000 UNIT capsule, Take 5,000 Units by mouth daily. Per pt list - takes 25,000, Disp: , Rfl:    betamethasone valerate ointment (VALISONE) 0.1 %, , Disp: , Rfl:   EXAM:   VITALS per patient if applicable: None reported  GENERAL: alert, oriented, appears well and in no acute distress  HEENT: atraumatic, conjunttiva clear, no obvious abnormalities on inspection of external nose and ears  NECK: normal movements of the head and neck  LUNGS: on inspection no signs of respiratory distress, breathing rate appears normal, no obvious gross increased work of breathing, gasping or wheezing  CV: no obvious cyanosis  MS: moves all visible extremities without noticeable abnormality  PSYCH/NEURO: pleasant and cooperative, no obvious depression or anxiety, speech  and thought processing grossly intact  ASSESSMENT AND PLAN:   Incontinence - Plan: Ambulatory referral to Urology per patient request  Hand cramps -She will schedule follow-up visit for labs to follow electrolytes.  Pruritic rash -Unclear etiology, for now have advised antihistamine and topical Benadryl, if persists in person evaluation is recommended.  Stage 4 very severe COPD by GOLD classification ([place])  - Plan: HYDROcodone-acetaminophen (NORCO/VICODIN) 5-325 MG tablet -Agree with plan for daily steroids and palliative care consultation.  Time spent: 34 minutes reviewing chart, interviewing and discussing with patient, formulating plan of care.     I discussed the assessment and treatment plan with the patient. The patient was provided an opportunity to ask questions and all were answered. The patient agreed with the plan and demonstrated an understanding of the instructions.   The patient was advised to call back or seek an in-person evaluation if the symptoms worsen or if the condition fails to improve as anticipated.    Lelon Frohlich, MD  [facility]

## 2020-10-30 DIAGNOSIS — M4722 Other spondylosis with radiculopathy, cervical region: Secondary | ICD-10-CM | POA: Diagnosis not present

## 2020-10-30 DIAGNOSIS — F332 Major depressive disorder, recurrent severe without psychotic features: Secondary | ICD-10-CM | POA: Diagnosis not present

## 2020-10-30 DIAGNOSIS — M8949 Other hypertrophic osteoarthropathy, multiple sites: Secondary | ICD-10-CM | POA: Diagnosis not present

## 2020-10-30 DIAGNOSIS — M170 Bilateral primary osteoarthritis of knee: Secondary | ICD-10-CM | POA: Diagnosis not present

## 2020-10-30 DIAGNOSIS — J449 Chronic obstructive pulmonary disease, unspecified: Secondary | ICD-10-CM

## 2020-10-30 DIAGNOSIS — J441 Chronic obstructive pulmonary disease with (acute) exacerbation: Secondary | ICD-10-CM

## 2020-10-31 ENCOUNTER — Telehealth: Payer: Self-pay | Admitting: Pulmonary Disease

## 2020-10-31 ENCOUNTER — Telehealth: Payer: Self-pay

## 2020-10-31 ENCOUNTER — Other Ambulatory Visit: Payer: Medicaid Other | Admitting: Hospice

## 2020-10-31 ENCOUNTER — Other Ambulatory Visit: Payer: Self-pay

## 2020-10-31 NOTE — Telephone Encounter (Signed)
Rn returned call to patient.  Patient had appt today at 12:30 with [facility] NP for palliative care. NP was unable to reach patient prior to visit, thus planned visit aborted.  Patient called to say her phone was not working correctly.  Offered RN visit to check on her soon and then to reschedule NP visit.  Pt declined need and prefers to cancel referral.  She needs help with property will and she has an Forensic scientist that can help.  She denies any symptom management needs after RN explained how palliative could help with symptoms, goals and interaction with her Mds. Referral intake with palliative notified to cancel referral.  Pt agreed to call us back if anything changes or she has needs.

## 2020-11-01 NOTE — Telephone Encounter (Signed)
Called patient but she did not answer. Left message for her to call back.  

## 2020-11-02 DIAGNOSIS — J449 Chronic obstructive pulmonary disease, unspecified: Secondary | ICD-10-CM | POA: Diagnosis not present

## 2020-11-05 ENCOUNTER — Ambulatory Visit (INDEPENDENT_AMBULATORY_CARE_PROVIDER_SITE_OTHER): Payer: Medicare HMO | Admitting: *Deleted

## 2020-11-05 DIAGNOSIS — M8949 Other hypertrophic osteoarthropathy, multiple sites: Secondary | ICD-10-CM

## 2020-11-05 DIAGNOSIS — M159 Polyosteoarthritis, unspecified: Secondary | ICD-10-CM

## 2020-11-05 DIAGNOSIS — J9611 Chronic respiratory failure with hypoxia: Secondary | ICD-10-CM

## 2020-11-05 DIAGNOSIS — J449 Chronic obstructive pulmonary disease, unspecified: Secondary | ICD-10-CM

## 2020-11-05 DIAGNOSIS — M4722 Other spondylosis with radiculopathy, cervical region: Secondary | ICD-10-CM

## 2020-11-05 NOTE — Patient Instructions (Signed)
Visit Information  PATIENT GOALS:  Goals Addressed               This Visit's Progress     Find Help with Senior Housing in My Community. (pt-stated)   On track     Timeframe:  Long-Range Goal Priority:  High Start Date:  09/09/2020                      Expected End Date:   12/10/2020            Follow-Up Date:  11/13/2020 at 2:00pm   Patient Goals/Self-Care Activities: Work with LCSW on a weekly/bi-weekly basis, to obtain assistance with arranging senior housing. LCSW is still awaiting a call from your [place], Pearla Dubonnet with Melodie Bouillon Attorney at [place], to discuss finances for long-term care assisted living facility placement. Have your [place], Pearla Dubonnet with Melodie Bouillon Attorney at [place], contact your brother's attorney to finalize the sell of the estate where you currently reside to free up funds so that you can move into a [employer] at [employer].        Patient verbalizes understanding of instructions provided today and agrees to view in [place].   Telephone follow-up appointment with care management team member scheduled for:  11/13/2020 at 2:00pm  [place] Licensed Clinical Social Worker [place] [phone]

## 2020-11-05 NOTE — Chronic Care Management (AMB) (Signed)
Chronic Care Management    Clinical Social Work Note  11/05/2020 Name: Rebecca [name] MRN: [id number] DOB: 14-Apr-1950  Rebecca [name] is a 70 y.o. year old female who is a primary care patient of Isaac Bliss, Rayford Halsted, MD. The CCM team was consulted to assist the patient with chronic disease management and/or care coordination needs related to: Level of Care Concerns.   Engaged with patient by telephone for follow-up visit in response to provider referral for social work chronic care management and care coordination services.   Consent to Services:  The patient was given information about Chronic Care Management services, agreed to services, and gave verbal consent prior to initiation of services.  Please see initial visit note for detailed documentation.   Patient agreed to services and consent obtained.   Assessment: Review of patient past medical history, allergies, medications, and health status, including review of relevant consultants reports was performed today as part of a comprehensive evaluation and provision of chronic care management and care coordination services.     SDOH (Social Determinants of Health) assessments and interventions performed:    Advanced Directives Status: Not addressed in this encounter.  CCM Care Plan  Allergies  Allergen Reactions   Keflex [Cephalexin] Diarrhea   Penicillins Nausea Only and Nausea And Vomiting    REACTION: nausea and vomiting REACTION: nausea and vomiting REACTION: nausea and vomiting    Outpatient Encounter Medications as of 11/05/2020  Medication Sig   albuterol (VENTOLIN HFA) 108 (90 Base) MCG/ACT inhaler INHALE TWO PUFFS INTO THE LUNGS EVERY 6 HOURS AS NEEDED FOR WHEEZING OR SHORTNESS OF BREATH   ALPRAZolam (XANAX) 0.5 MG tablet TAKE ONE TABLET BY MOUTH THREE TIMES A DAY AS NEEDED FOR NERVES  --- MAXIMUM 3 TABLETS ---   APPLE CIDER VINEGAR PO Take 1,000 mg by mouth daily.   Ascorbic Acid (VITAMIN C) 1000 MG  tablet Take 1,000 mg by mouth daily.   betamethasone valerate ointment (VALISONE) 0.1 %    cetirizine (ZYRTEC) 10 MG tablet Take 10 mg by mouth daily.   Cholecalciferol (VITAMIN D-3) 125 MCG (5000 UT) TABS Take 1 tablet by mouth daily. Alternate 1k every other day with 2k   CINNAMON PO Take 2,000 mg by mouth every morning. Takes 2 every am   colchicine 0.6 MG tablet Take 1 tablet (0.6 mg total) by mouth daily.   furosemide (LASIX) 40 MG tablet TAKE ONE TO TWO TABLETS BY MOUTH DAILY AS NEEDED FOR SWELLING   glucosamine-chondroitin 500-400 MG tablet Take 2 tablets by mouth 2 (two) times daily.    GUAIFENESIN ER PO Take 400 mg by mouth 2 (two) times daily. Tales 3 tab;es twice a day   HYDROcodone-acetaminophen (NORCO/VICODIN) 5-325 MG tablet Take 1 tablet by mouth every 6 (six) hours as needed for moderate pain.   ipratropium-albuterol (DUONEB) 0.5-2.5 (3) MG/3ML SOLN INHALE 1 VIAL VIA NEBULIZATION THREE TIMES A DAY   Magnesium 400 MG CAPS Take 3 capsules by mouth 2 (two) times daily.    methylPREDNISolone (MEDROL) 2 MG tablet Take '10mg'$  daily   Misc Natural Products (TART CHERRY ADVANCED PO) Take by mouth.   MITIGARE 0.6 MG CAPS TAKE ONE CAPSULE BY MOUTH TWICE A DAY   morphine 20 MG/5ML solution Take 1.3 mLs (5.2 mg total) by mouth every 4 (four) hours as needed (dyspnea).   OVER THE COUNTER MEDICATION CBD Oil   OXYGEN Inhale 4 L into the lungs.   sertraline (ZOLOFT) 100 MG tablet Take 1 tablet (100  mg total) by mouth daily.   sertraline (ZOLOFT) 50 MG tablet TAKE ONE TABLET BY MOUTH DAILY   sodium chloride HYPERTONIC 3 % nebulizer solution Take by nebulization 2 (two) times daily.   Spacer/Aero-Holding Chambers (AEROCHAMBER MV) inhaler Use as instructed   TRELEGY ELLIPTA 100-62.5-25 MCG/INH AEPB INHALE ONE PUFF BY MOUTH DAILY   TURMERIC PO Take 1,000 mg by mouth 2 (two) times daily. Takes 1,000 mg   UNABLE TO FIND Med Name: Mullien OTC   vitamin A 10000 UNIT capsule Take 5,000 Units by mouth  daily. Per pt list - takes 25,000   No facility-administered encounter medications on file as of 11/05/2020.    Patient Active Problem List   Diagnosis Date Noted   Bradycardia with 31-40 beats per minute 01/04/2020   Takes dietary supplements 04/26/2018   DNR (do not resuscitate) 04/05/2018   CKD (chronic kidney disease) stage 3, GFR 30-59 ml/min ([place]) 123456   Umbilical hernia without obstruction and without gangrene 01/18/2018   Degenerative arthritis of knee, bilateral 05/13/2017   Osteoarthritis of spine with radiculopathy, cervical region 07/31/2016   Nocturnal leg cramps 99991111   Diastolic dysfunction 123XX123   Pain, joint, ankle, left 07/10/2014   Olecranon bursitis of right elbow 05/17/2014   Left arm pain 03/10/2014   Biceps tendinitis on left 03/10/2014   Neck pain of over 3 months duration 03/10/2014   Chronic respiratory failure ([place]) 02/15/2014   Chest pressure 05/25/2013   Impaired glucose tolerance 12/08/2011   Chest pain, atypical 01/14/2011   Asthma with exacerbation 04/11/2009   Vitamin D deficiency 01/21/2009   Hypothyroidism XX123456   METABOLIC SYNDROME X XX123456   Morbid obesity ([place]) 06/24/2007   DYSTHYMIA 06/24/2007   Venous (peripheral) insufficiency 06/24/2007   Osteoarthritis 06/24/2007   UTERINE CANCER, HX OF 06/24/2007   Obstructive sleep apnea 02/28/2007   COPD mixed type ([place]) 02/28/2007   GERD 02/28/2007   Irritable bowel syndrome 02/28/2007    Conditions to be addressed/monitored: COPD and CKD Stage IIII.  Housing Barriers, Level of Care Concerns, Family and Relationship Dysfunction, and Lacks Knowledge of [employer].  Care Plan : LCSW Plan of Care  Updates made by Francis Gaines, LCSW since 11/05/2020 12:00 AM     Problem: Find Help with Senior Housing in My Community.   Priority: High     Long-Range Goal: Find Help with Senior Housing in My Community.   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 Limited social support, limited access to caregiver and family discord with all 4 siblings regarding [employer] and family owned property where patient currently resides. Clinical Goals:  Patient will work with LCSW to address needs related to senior housing. Clinical Interventions:  Patient interviewed and appropriate assessments performed. Collaboration with Primary Care Physician, Dr. Lelon Frohlich regarding development and update of comprehensive plan of care as evidenced by provider attestation and co-signature. Inter-disciplinary care team collaboration (see longitudinal plan of care). Assessment of needs, barriers, agencies contacted, as well as how impacting. Provided patient with information about all available senior housing resources in [place] and [place]. Discussed plans with patient for ongoing care management follow-up and provided patient  with direct contact information for care management team. Other Clinical Interventions: PHQ 2 and PHQ 9 Depression Screen performed and results reviewed with patient, Solution-Focused Strategies implemented, Deep Breathing Exercises, Relaxation Techniques and Mindfulness Meditation Strategies encouraged on a daily basis, Active Listening/Reflection utilized, Emotional Support provided, Problem Solving/Task Centered Skills utilized, Motivational Interviewing performed, Quality of Sleep assessed and Sleep Hygiene Techniques promoted, Increase in Actives/Exercise encouraged, Verbalization of Feelings encouraged, Crisis Resource Education/Information provided, and Suicidal  Ideation/Homicidal Ideation assessed - None Present. Patient Goals/Self-Care Activities: Work with LCSW on a weekly/bi-weekly basis, to obtain assistance with arranging senior housing. LCSW is still awaiting a call from your [place], Pearla Dubonnet with Melodie Bouillon Attorney at [place], to discuss finances for long-term care assisted living facility placement. Have your [place], Pearla Dubonnet with Melodie Bouillon Attorney at [place], contact your brother's attorney to finalize the sell of the estate where you currently reside to free up funds so that you can move into a [employer] at [employer]. Follow-Up Date:  11/13/2020 at 2:00pm      Follow-Up Plan: 11/13/2020 at 2:00pm      [place] Clinical Social Worker [place] [phone]

## 2020-11-11 ENCOUNTER — Other Ambulatory Visit: Payer: Self-pay | Admitting: Internal Medicine

## 2020-11-11 DIAGNOSIS — F419 Anxiety disorder, unspecified: Secondary | ICD-10-CM

## 2020-11-13 ENCOUNTER — Ambulatory Visit: Payer: Medicare HMO | Admitting: *Deleted

## 2020-11-13 DIAGNOSIS — J9611 Chronic respiratory failure with hypoxia: Secondary | ICD-10-CM

## 2020-11-13 DIAGNOSIS — J449 Chronic obstructive pulmonary disease, unspecified: Secondary | ICD-10-CM

## 2020-11-13 DIAGNOSIS — N1832 Chronic kidney disease, stage 3b: Secondary | ICD-10-CM

## 2020-11-13 NOTE — Patient Instructions (Signed)
Visit Information  PATIENT GOALS:  Goals Addressed               This Visit's Progress     Find Help with Senior Housing in My Community. (pt-stated)   On track     Timeframe:  Long-Range Goal Priority:  High Start Date:  09/09/2020                      Expected End Date:   12/10/2020            Follow-Up Date:  11/22/2020 at 9:00am   Patient Goals/Self-Care Activities: Continue to work with LCSW on a weekly/bi-weekly basis, to obtain assistance with arranging senior housing. LCSW will assist in trying to make contact with Pearla Dubonnet, Attorney with Melodie Bouillon Attorney at [facility] [phone]), to discuss selling of the estate for you to move into [employer]. Encourage Attorney, Amy Smith with Melodie Bouillon Attorney at [place], to contact your brother's attorney to finalize the sell of the estate.  Contact LCSW directly (# [phone]) if you have questions, need assistance, or if additional social work needs are identified between now and our next scheduled telephone outreach call.        Patient verbalizes understanding of instructions provided today and agrees to view in [place].   Telephone follow up appointment with care management team member scheduled for:  11/22/2020 at [place] Clinical Social Worker [place] [phone]

## 2020-11-13 NOTE — Chronic Care Management (AMB) (Signed)
Chronic Care Management    Clinical Social Work Note  11/13/2020 Name: Rebecca [name] MRN: [id number] DOB: Sep 10, 1950  Rebecca [name] is a 70 y.o. year old female who is a primary care patient of Isaac Bliss, Rayford Halsted, MD. The CCM team was consulted to assist the patient with chronic disease management and/or care coordination needs related to: Level of Care Concerns.   Engaged with patient by telephone for follow-up visit in response to provider referral for social work chronic care management and care coordination services.   Consent to Services:  The patient was given information about Chronic Care Management services, agreed to services, and gave verbal consent prior to initiation of services.  Please see initial visit note for detailed documentation.   Patient agreed to services and consent obtained.   Assessment: Review of patient past medical history, allergies, medications, and health status, including review of relevant consultants reports was performed today as part of a comprehensive evaluation and provision of chronic care management and care coordination services.     SDOH (Social Determinants of Health) assessments and interventions performed:    Advanced Directives Status: Not addressed in this encounter.  CCM Care Plan  Allergies  Allergen Reactions   Keflex [Cephalexin] Diarrhea   Penicillins Nausea Only and Nausea And Vomiting    REACTION: nausea and vomiting REACTION: nausea and vomiting REACTION: nausea and vomiting    Outpatient Encounter Medications as of 11/13/2020  Medication Sig   albuterol (VENTOLIN HFA) 108 (90 Base) MCG/ACT inhaler INHALE TWO PUFFS INTO THE LUNGS EVERY 6 HOURS AS NEEDED FOR WHEEZING OR SHORTNESS OF BREATH   ALPRAZolam (XANAX) 0.5 MG tablet TAKE ONE TABLET BY MOUTH THREE TIMES A DAY AS NEEDED FOR NERVES   --- MAXIMUM 3 TABLETS ---   APPLE CIDER VINEGAR PO Take 1,000 mg by mouth daily.   Ascorbic Acid (VITAMIN C) 1000 MG  tablet Take 1,000 mg by mouth daily.   betamethasone valerate ointment (VALISONE) 0.1 %    cetirizine (ZYRTEC) 10 MG tablet Take 10 mg by mouth daily.   Cholecalciferol (VITAMIN D-3) 125 MCG (5000 UT) TABS Take 1 tablet by mouth daily. Alternate 1k every other day with 2k   CINNAMON PO Take 2,000 mg by mouth every morning. Takes 2 every am   colchicine 0.6 MG tablet Take 1 tablet (0.6 mg total) by mouth daily.   furosemide (LASIX) 40 MG tablet TAKE ONE TO TWO TABLETS BY MOUTH DAILY AS NEEDED FOR SWELLING   glucosamine-chondroitin 500-400 MG tablet Take 2 tablets by mouth 2 (two) times daily.    GUAIFENESIN ER PO Take 400 mg by mouth 2 (two) times daily. Tales 3 tab;es twice a day   HYDROcodone-acetaminophen (NORCO/VICODIN) 5-325 MG tablet Take 1 tablet by mouth every 6 (six) hours as needed for moderate pain.   ipratropium-albuterol (DUONEB) 0.5-2.5 (3) MG/3ML SOLN INHALE 1 VIAL VIA NEBULIZATION THREE TIMES A DAY   Magnesium 400 MG CAPS Take 3 capsules by mouth 2 (two) times daily.    methylPREDNISolone (MEDROL) 2 MG tablet Take '10mg'$  daily   Misc Natural Products (TART CHERRY ADVANCED PO) Take by mouth.   MITIGARE 0.6 MG CAPS TAKE ONE CAPSULE BY MOUTH TWICE A DAY   morphine 20 MG/5ML solution Take 1.3 mLs (5.2 mg total) by mouth every 4 (four) hours as needed (dyspnea).   OVER THE COUNTER MEDICATION CBD Oil   OXYGEN Inhale 4 L into the lungs.   sertraline (ZOLOFT) 100 MG tablet Take 1 tablet (  100 mg total) by mouth daily.   sertraline (ZOLOFT) 50 MG tablet TAKE ONE TABLET BY MOUTH DAILY   sodium chloride HYPERTONIC 3 % nebulizer solution Take by nebulization 2 (two) times daily.   Spacer/Aero-Holding Chambers (AEROCHAMBER MV) inhaler Use as instructed   TRELEGY ELLIPTA 100-62.5-25 MCG/INH AEPB INHALE ONE PUFF BY MOUTH DAILY   TURMERIC PO Take 1,000 mg by mouth 2 (two) times daily. Takes 1,000 mg   UNABLE TO FIND Med Name: Mullien OTC   vitamin A 10000 UNIT capsule Take 5,000 Units by mouth  daily. Per pt list - takes 25,000   No facility-administered encounter medications on file as of 11/13/2020.    Patient Active Problem List   Diagnosis Date Noted   Bradycardia with 31-40 beats per minute 01/04/2020   Takes dietary supplements 04/26/2018   DNR (do not resuscitate) 04/05/2018   CKD (chronic kidney disease) stage 3, GFR 30-59 ml/min ([place]) 123456   Umbilical hernia without obstruction and without gangrene 01/18/2018   Degenerative arthritis of knee, bilateral 05/13/2017   Osteoarthritis of spine with radiculopathy, cervical region 07/31/2016   Nocturnal leg cramps 99991111   Diastolic dysfunction 123XX123   Pain, joint, ankle, left 07/10/2014   Olecranon bursitis of right elbow 05/17/2014   Left arm pain 03/10/2014   Biceps tendinitis on left 03/10/2014   Neck pain of over 3 months duration 03/10/2014   Chronic respiratory failure ([place]) 02/15/2014   Chest pressure 05/25/2013   Impaired glucose tolerance 12/08/2011   Chest pain, atypical 01/14/2011   Asthma with exacerbation 04/11/2009   Vitamin D deficiency 01/21/2009   Hypothyroidism XX123456   METABOLIC SYNDROME X XX123456   Morbid obesity ([place]) 06/24/2007   DYSTHYMIA 06/24/2007   Venous (peripheral) insufficiency 06/24/2007   Osteoarthritis 06/24/2007   UTERINE CANCER, HX OF 06/24/2007   Obstructive sleep apnea 02/28/2007   COPD mixed type ([place]) 02/28/2007   GERD 02/28/2007   Irritable bowel syndrome 02/28/2007    Conditions to be addressed/monitored: CKD Stage 4.  Housing Barriers, Family and Relationship Discord, and Level of Care Concerns.  Care Plan : LCSW Plan of Care  Updates made by Francis Gaines, LCSW since 11/13/2020 12:00 AM     Problem: Find Help with Senior Housing in My Community.   Priority: High     Long-Range Goal: Find Help with Senior Housing in My Community.   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 Limited social support, limited access to caregiver and family discord with all 4 siblings regarding [employer] and family owned property where patient currently resides. Clinical Goals:  Patient will work with LCSW to address needs related to senior housing. Clinical Interventions:  Patient interviewed and appropriate assessments performed. Collaboration with Primary Care Physician, Dr. Lelon Frohlich regarding development and update of comprehensive plan of care as evidenced by provider attestation and co-signature. Inter-disciplinary care team collaboration (see longitudinal plan of care). Assessment of needs, barriers, agencies contacted, as well as how impacting. Other Clinical Interventions: PHQ 2 and PHQ 9 Depression Screen performed and results reviewed with patient, Solution-Focused Strategies implemented, Deep Breathing Exercises, Relaxation Techniques and Mindfulness Meditation Strategies encouraged on a daily basis,  Active Listening/Reflection utilized, Engineer, petroleum provided, Problem Solving/Task Centered Skills utilized, Motivational Interviewing performed, Quality of Sleep assessed and Sleep Hygiene Techniques promoted, Increase in Actives/Exercise encouraged, Verbalization of Feelings encouraged, Crisis Resource Education/Information provided, and Suicidal Ideation/Homicidal Ideation assessed - None Present. Patient Goals/Self-Care Activities: Continue to work with LCSW on a weekly/bi-weekly basis, to obtain assistance with arranging senior housing. LCSW will assist in trying to make contact with Pearla Dubonnet, Attorney with Melodie Bouillon Attorney at [facility] [phone]), to discuss selling of the estate for you to move into [employer]. Encourage Attorney, Amy Smith with Melodie Bouillon Attorney at [place], to contact your brother's attorney to finalize the sell of the estate.  Contact LCSW directly (# [phone]) if you have questions, need assistance, or if additional social work needs are identified between now and our next scheduled telephone outreach call. Follow-Up Date:  11/22/2020 at [place]:  11/22/2020 at [place] Social Worker [place] [phone]

## 2020-11-15 NOTE — Telephone Encounter (Signed)
LMTCB  Per protocol will close encounter.

## 2020-11-21 ENCOUNTER — Telehealth: Payer: Self-pay | Admitting: Pulmonary Disease

## 2020-11-21 NOTE — Telephone Encounter (Signed)
Called and spoke with pt and she is aware that it will be tomorrow before she hears back from our office and she was fine with that.    She stated that she is on medrol and abx 250 mg daily but she is having ?sinus infection. She is having neck pain that is helped some by taking aleve.  She stated that it hurts up into her head.  She said that her nasal passages get clogged and she has a hard time breathing in her supplemental oxygen.  She wanted to see if another abx needed to be added.  PM please advise. Thanks

## 2020-11-21 NOTE — Telephone Encounter (Signed)
Pt was doing really well with COPD. Pt states she feels like she's slipping back- she's sob. Pt states she may have a sinus infection- sinus pressure, neck is very sore,issues breathing through nose.Pt is on steroids and abx currently. Please advise [phone]

## 2020-11-22 ENCOUNTER — Ambulatory Visit: Payer: Medicare HMO | Admitting: *Deleted

## 2020-11-22 DIAGNOSIS — J9611 Chronic respiratory failure with hypoxia: Secondary | ICD-10-CM

## 2020-11-22 DIAGNOSIS — J449 Chronic obstructive pulmonary disease, unspecified: Secondary | ICD-10-CM

## 2020-11-22 DIAGNOSIS — M4722 Other spondylosis with radiculopathy, cervical region: Secondary | ICD-10-CM

## 2020-11-22 MED ORDER — AMOXICILLIN-POT CLAVULANATE 875-125 MG PO TABS: 1.0000 | ORAL_TABLET | Freq: Two times a day (BID) | ORAL | 0 refills | Status: AC

## 2020-11-22 NOTE — Chronic Care Management (AMB) (Signed)
Chronic Care Management    Clinical Social Work Note  11/22/2020 Name: Rebecca [name] MRN: [id number] DOB: 1950/11/26  Rebecca [name] is a 70 y.o. year old female who is a primary care patient of Isaac Bliss, Rayford Halsted, MD. The CCM team was consulted to assist the patient with chronic disease management and/or care coordination needs related to: Level of Care Concerns and Housing Barriers.   Engaged with patient by telephone for follow-up visit in response to provider referral for social work chronic care management and care coordination services.   Consent to Services:  The patient was given information about Chronic Care Management services, agreed to services, and gave verbal consent prior to initiation of services.  Please see initial visit note for detailed documentation.   Patient agreed to services and consent obtained.   Assessment: Review of patient past medical history, allergies, medications, and health status, including review of relevant consultants reports was performed today as part of a comprehensive evaluation and provision of chronic care management and care coordination services.     SDOH (Social Determinants of Health) assessments and interventions performed:    Advanced Directives Status: Not addressed in this encounter.  CCM Care Plan  Allergies  Allergen Reactions   Keflex [Cephalexin] Diarrhea   Penicillins Nausea Only and Nausea And Vomiting    REACTION: nausea and vomiting REACTION: nausea and vomiting REACTION: nausea and vomiting    Outpatient Encounter Medications as of 11/22/2020  Medication Sig   albuterol (VENTOLIN HFA) 108 (90 Base) MCG/ACT inhaler INHALE TWO PUFFS INTO THE LUNGS EVERY 6 HOURS AS NEEDED FOR WHEEZING OR SHORTNESS OF BREATH   ALPRAZolam (XANAX) 0.5 MG tablet TAKE ONE TABLET BY MOUTH THREE TIMES A DAY AS NEEDED FOR NERVES   --- MAXIMUM 3 TABLETS ---   APPLE CIDER VINEGAR PO Take 1,000 mg by mouth daily.   Ascorbic Acid  (VITAMIN C) 1000 MG tablet Take 1,000 mg by mouth daily.   betamethasone valerate ointment (VALISONE) 0.1 %    cetirizine (ZYRTEC) 10 MG tablet Take 10 mg by mouth daily.   Cholecalciferol (VITAMIN D-3) 125 MCG (5000 UT) TABS Take 1 tablet by mouth daily. Alternate 1k every other day with 2k   CINNAMON PO Take 2,000 mg by mouth every morning. Takes 2 every am   colchicine 0.6 MG tablet Take 1 tablet (0.6 mg total) by mouth daily.   furosemide (LASIX) 40 MG tablet TAKE ONE TO TWO TABLETS BY MOUTH DAILY AS NEEDED FOR SWELLING   glucosamine-chondroitin 500-400 MG tablet Take 2 tablets by mouth 2 (two) times daily.    GUAIFENESIN ER PO Take 400 mg by mouth 2 (two) times daily. Tales 3 tab;es twice a day   HYDROcodone-acetaminophen (NORCO/VICODIN) 5-325 MG tablet Take 1 tablet by mouth every 6 (six) hours as needed for moderate pain.   ipratropium-albuterol (DUONEB) 0.5-2.5 (3) MG/3ML SOLN INHALE 1 VIAL VIA NEBULIZATION THREE TIMES A DAY   Magnesium 400 MG CAPS Take 3 capsules by mouth 2 (two) times daily.    methylPREDNISolone (MEDROL) 2 MG tablet Take 10mg  daily   Misc Natural Products (TART CHERRY ADVANCED PO) Take by mouth.   MITIGARE 0.6 MG CAPS TAKE ONE CAPSULE BY MOUTH TWICE A DAY   morphine 20 MG/5ML solution Take 1.3 mLs (5.2 mg total) by mouth every 4 (four) hours as needed (dyspnea).   OVER THE COUNTER MEDICATION CBD Oil   OXYGEN Inhale 4 L into the lungs.   sertraline (ZOLOFT) 100 MG tablet  Take 1 tablet (100 mg total) by mouth daily.   sertraline (ZOLOFT) 50 MG tablet TAKE ONE TABLET BY MOUTH DAILY   sodium chloride HYPERTONIC 3 % nebulizer solution Take by nebulization 2 (two) times daily.   Spacer/Aero-Holding Chambers (AEROCHAMBER MV) inhaler Use as instructed   TRELEGY ELLIPTA 100-62.5-25 MCG/INH AEPB INHALE ONE PUFF BY MOUTH DAILY   TURMERIC PO Take 1,000 mg by mouth 2 (two) times daily. Takes 1,000 mg   UNABLE TO FIND Med Name: Mullien OTC   vitamin A 10000 UNIT capsule Take  5,000 Units by mouth daily. Per pt list - takes 25,000   No facility-administered encounter medications on file as of 11/22/2020.    Patient Active Problem List   Diagnosis Date Noted   Bradycardia with 31-40 beats per minute 01/04/2020   Takes dietary supplements 04/26/2018   DNR (do not resuscitate) 04/05/2018   CKD (chronic kidney disease) stage 3, GFR 30-59 ml/min ([place]) 66/59/9357   Umbilical hernia without obstruction and without gangrene 01/18/2018   Degenerative arthritis of knee, bilateral 05/13/2017   Osteoarthritis of spine with radiculopathy, cervical region 07/31/2016   Nocturnal leg cramps 01/77/9390   Diastolic dysfunction 30/10/2328   Pain, joint, ankle, left 07/10/2014   Olecranon bursitis of right elbow 05/17/2014   Left arm pain 03/10/2014   Biceps tendinitis on left 03/10/2014   Neck pain of over 3 months duration 03/10/2014   Chronic respiratory failure ([place]) 02/15/2014   Chest pressure 05/25/2013   Impaired glucose tolerance 12/08/2011   Chest pain, atypical 01/14/2011   Asthma with exacerbation 04/11/2009   Vitamin D deficiency 01/21/2009   Hypothyroidism 07/62/2633   METABOLIC SYNDROME X 35/45/6256   Morbid obesity ([place]) 06/24/2007   DYSTHYMIA 06/24/2007   Venous (peripheral) insufficiency 06/24/2007   Osteoarthritis 06/24/2007   UTERINE CANCER, HX OF 06/24/2007   Obstructive sleep apnea 02/28/2007   COPD mixed type ([place]) 02/28/2007   GERD 02/28/2007   Irritable bowel syndrome 02/28/2007    Conditions to be addressed/monitored: DMII and CKD Stage IIII.  Housing Barriers, Level of Care Concerns, Family and Relationship Dysfunction, Social Isolation, Limited Access to Caregiver, and Lacks Knowledge of [employer].  Care Plan : LCSW Plan of Care  Updates made by Francis Gaines, LCSW since 11/22/2020 12:00 AM     Problem: Find Help with Senior Housing in My Community. Resolved 11/22/2020  Priority: High     Long-Range Goal: Find Help with  Senior Housing in My Community. 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 Clinical Goals:  Patient will work with LCSW to address needs related to senior housing. Clinical Interventions:  Collaboration with Primary Care Physician, Dr. Lelon Frohlich regarding development and update of comprehensive plan of care as evidenced by provider attestation and co-signature. Patient Goals/Self-Care Activities: LCSW has made several attempts to try and contact [place], Attorney with Melodie Bouillon Attorney at [facility] [phone]), to discuss the selling of your estate, but without success.  HIPAA compliant messages have been left on voicemail for Mrs. Tamala Julian, but a return call has not been received, as of yet.   Please continue with  your attempts to try and contact [place], Attorney with Melodie Bouillon Attorney at [facility], to discuss the selling of your estate, notifying LCSW directly [phone]) if you need assistance finding alternate housing arrangements, once the estate is sold.  Follow-Up Date:  No Follow-Up Required   Nat Christen LCSW Licensed Clinical Social Worker [place] [phone]

## 2020-11-22 NOTE — Telephone Encounter (Signed)
Please call in augmentin 875 mg bid for 5 days

## 2020-11-22 NOTE — Telephone Encounter (Signed)
Called and spoke with patient. She verbalized understanding. Will go ahead and send in abx.   Nothing further needed at time of call.

## 2020-11-22 NOTE — Patient Instructions (Addendum)
Visit Information  PATIENT GOALS:  Goals Addressed               This Visit's Progress     COMPLETED: Find Help with Senior Housing in My Community. (pt-stated)   On track     Timeframe:  Long-Range Goal Priority:  High Start Date:  09/09/2020                      Expected End Date:   11/22/2020           Follow-Up Date:  No Follow-Up Required.   Patient Goals/Self-Care Activities: LCSW has made several attempts to try and contact [place], Attorney with Melodie Bouillon Attorney at [facility] [phone]), to discuss the selling of your estate, but without success.  HIPAA compliant messages have been left on voicemail for Mrs. Tamala Julian, but a return call has not been received, as of yet.   Please continue with your attempts to try and contact [place], Attorney with Melodie Bouillon Attorney at [facility], to discuss the selling of your estate, notifying LCSW directly [phone]) if you need assistance finding alternate housing arrangements, once the estate is sold.         Patient verbalizes understanding of instructions provided today and agrees to view in [place].   No Follow-Up Required.  Nat Christen LCSW Licensed Clinical Social Worker [place] [phone]

## 2020-11-29 DIAGNOSIS — M8949 Other hypertrophic osteoarthropathy, multiple sites: Secondary | ICD-10-CM | POA: Diagnosis not present

## 2020-11-29 DIAGNOSIS — M4722 Other spondylosis with radiculopathy, cervical region: Secondary | ICD-10-CM

## 2020-11-29 DIAGNOSIS — J449 Chronic obstructive pulmonary disease, unspecified: Secondary | ICD-10-CM

## 2020-12-02 DIAGNOSIS — J449 Chronic obstructive pulmonary disease, unspecified: Secondary | ICD-10-CM | POA: Diagnosis not present

## 2020-12-09 ENCOUNTER — Telehealth: Payer: Self-pay | Admitting: Pulmonary Disease

## 2020-12-09 NOTE — Telephone Encounter (Signed)
Called and spoke with pt and she is aware of appt scheduled with PM on 01/09/21.  She was talking about how she has a hard time going out when she is taking the lasix.  She stated that the 80 mg lasix is too much at times and she wets herself.  She stated that on the days that she has to go out of the house she only takes 40 mg.  I stated to her that the lasix is written for 40 mg-80 mg daily as needed.  She is aware that this is ok for her to take like that.  She wanted to be sure. Nothing further is needed.

## 2021-01-02 DIAGNOSIS — J449 Chronic obstructive pulmonary disease, unspecified: Secondary | ICD-10-CM | POA: Diagnosis not present

## 2021-01-08 ENCOUNTER — Telehealth (INDEPENDENT_AMBULATORY_CARE_PROVIDER_SITE_OTHER): Payer: Medicare HMO | Admitting: Internal Medicine

## 2021-01-08 ENCOUNTER — Encounter: Payer: Self-pay | Admitting: Internal Medicine

## 2021-01-08 DIAGNOSIS — R252 Cramp and spasm: Secondary | ICD-10-CM

## 2021-01-08 NOTE — Progress Notes (Signed)
Virtual Visit via Video Note  I connected with Rebecca [name] on 01/08/21 at  2:00 PM EST by a video enabled telemedicine application and verified that I am speaking with the correct person using two identifiers.  Location patient: home Location provider: work office Persons participating in the virtual visit: patient, provider  I discussed the limitations of evaluation and management by telemedicine and the availability of in person appointments. The patient expressed understanding and agreed to proceed.   HPI: She is scheduled this visit mainly to discuss bilateral hand and leg cramps.  We had a visit for hand cramps back in August and she was supposed to come in for lab work to check electrolytes which she never did.  Cramps have now progressed to her legs.  She continues to have significant issues with her COPD and is now completely steroid-dependent.  She is unable to care for herself at her home.  She would like to remain independent but we have had a conversation today that assisted living would be the best route for her to go.  She seems quite tearful today, states she is not depressed.   ROS: Constitutional: Denies fever, chills, diaphoresis, appetite change. HEENT: Denies photophobia, eye pain, redness, hearing loss, ear pain, congestion, sore throat, rhinorrhea, sneezing, mouth sores, trouble swallowing, neck pain, neck stiffness and tinnitus.   Respiratory: Denies  cough, chest tightness,  and wheezing.   Cardiovascular: Denies chest pain, palpitations and leg swelling.  Gastrointestinal: Denies nausea, vomiting, abdominal pain, diarrhea, constipation, blood in stool and abdominal distention.  Genitourinary: Denies dysuria, urgency, frequency, hematuria, flank pain and difficulty urinating.  Endocrine: Denies: hot or cold intolerance, sweats, changes in hair or nails, polyuria, polydipsia. Musculoskeletal: Positive for myalgias, back pain, joint swelling, arthralgias and  gait problem.  Skin: Denies pallor, rash and wound.  Neurological: Denies dizziness, seizures, syncope, weakness, light-headedness, numbness and headaches.  Hematological: Denies adenopathy. Easy bruising, personal or family bleeding history  Psychiatric/Behavioral: Denies suicidal ideation, mood changes, confusion, nervousness, sleep disturbance and agitation   Past Medical History:  Diagnosis Date   Anxiety    Asthma    Back pain    Bilateral swelling of feet    Chest pain    Chest pain    Chewing difficulty    Chronic venous insufficiency    COPD (chronic obstructive pulmonary disease) ([name])    Depression    DJD (degenerative joint disease)    Drug use    Dysthymia    Emphysema/COPD ([name])    Fatty liver    Fibromyalgia    Gallbladder problem    GERD (gastroesophageal reflux disease)    Gout    History of headache    Hodgkin lymphoma of extranodal or solid organ site [facility])    Hypothyroid    IBS (irritable bowel syndrome)    Infertility, female    Joint pain    Kidney problem    Metabolic syndrome X    Morbid obesity ([name])    OSA (obstructive sleep apnea)    Osteoarthritis    SOB (shortness of breath)    Swallowing difficulty    Vitamin B12 deficiency    Vitamin D deficiency     Past Surgical History:  Procedure Laterality Date   APPENDECTOMY     BILATERAL SALPINGOOPHORECTOMY  2007   [facility]   CHOLECYSTECTOMY  1982   LAPAROSCOPIC GASTRIC BANDING  08/2007   Dr. Hassell Done   LAPAROSCOPIC HYSTERECTOMY  2007   [facility]  SPLENECTOMY  at g[facility] 8   d/t trauma    Family History  Problem Relation Age of Onset   Heart failure Father    Cancer Father    Parkinson's disease Mother    Endometrial cancer Mother    Thyroid disease Mother    Cancer Mother    Obesity Mother    Heart attack Sister    Diabetes Mellitus II Brother     SOCIAL HX:   reports that she quit smoking about 29 years ago. Her smoking use included cigarettes. She started smoking about 58 years  ago. She has a 29.00 pack-year smoking history. She has never used smokeless tobacco. She reports that she does not drink alcohol and does not use drugs.   Current Outpatient Medications:    albuterol (VENTOLIN HFA) 108 (90 Base) MCG/ACT inhaler, INHALE TWO PUFFS INTO THE LUNGS EVERY 6 HOURS AS NEEDED FOR WHEEZING OR SHORTNESS OF BREATH, Disp: 18 g, Rfl: 11   ALPRAZolam (XANAX) 0.5 MG tablet, TAKE ONE TABLET BY MOUTH THREE TIMES A DAY AS NEEDED FOR NERVES   --- MAXIMUM 3 TABLETS ---, Disp: 90 tablet, Rfl: 1   amoxicillin-clavulanate (AUGMENTIN) 875-125 MG tablet, Take 1 tablet by mouth 2 (two) times daily., Disp: 10 tablet, Rfl: 0   APPLE CIDER VINEGAR PO, Take 1,000 mg by mouth daily., Disp: , Rfl:    Ascorbic Acid (VITAMIN C) 1000 MG tablet, Take 1,000 mg by mouth daily., Disp: , Rfl:    betamethasone valerate ointment (VALISONE) 0.1 %, , Disp: , Rfl:    cetirizine (ZYRTEC) 10 MG tablet, Take 10 mg by mouth daily., Disp: , Rfl:    Cholecalciferol (VITAMIN D-3) 125 MCG (5000 UT) TABS, Take 1 tablet by mouth daily. Alternate 1k every other day with 2k, Disp: 30 tablet, Rfl:    CINNAMON PO, Take 2,000 mg by mouth every morning. Takes 2 every am, Disp: , Rfl:    colchicine 0.6 MG tablet, Take 1 tablet (0.6 mg total) by mouth daily., Disp: 30 tablet, Rfl: 0   furosemide (LASIX) 40 MG tablet, TAKE ONE TO TWO TABLETS BY MOUTH DAILY AS NEEDED FOR SWELLING, Disp: 180 tablet, Rfl: 1   glucosamine-chondroitin 500-400 MG tablet, Take 2 tablets by mouth 2 (two) times daily. , Disp: , Rfl:    GUAIFENESIN ER PO, Take 400 mg by mouth 2 (two) times daily. Tales 3 tab;es twice a day, Disp: , Rfl:    HYDROcodone-acetaminophen (NORCO/VICODIN) 5-325 MG tablet, Take 1 tablet by mouth every 6 (six) hours as needed for moderate pain., Disp: 20 tablet, Rfl: 0   ipratropium-albuterol (DUONEB) 0.5-2.5 (3) MG/3ML SOLN, INHALE 1 VIAL VIA NEBULIZATION THREE TIMES A DAY, Disp: 270 mL, Rfl: 5   Magnesium 400 MG CAPS, Take 3  capsules by mouth 2 (two) times daily. , Disp: , Rfl:    methylPREDNISolone (MEDROL) 2 MG tablet, Take 10mg  daily, Disp: 150 tablet, Rfl: 5   Misc Natural Products (TART CHERRY ADVANCED PO), Take by mouth., Disp: , Rfl:    MITIGARE 0.6 MG CAPS, TAKE ONE CAPSULE BY MOUTH TWICE A DAY, Disp: 60 capsule, Rfl: 2   morphine 20 MG/5ML solution, Take 1.3 mLs (5.2 mg total) by mouth every 4 (four) hours as needed (dyspnea)., Disp: 30 mL, Rfl: 0   OVER THE COUNTER MEDICATION, CBD Oil, Disp: , Rfl:    OXYGEN, Inhale 4 L into the lungs., Disp: , Rfl:    sertraline (ZOLOFT) 100 MG tablet, Take 1 tablet (100 mg  total) by mouth daily., Disp: 90 tablet, Rfl: 1   sertraline (ZOLOFT) 50 MG tablet, TAKE ONE TABLET BY MOUTH DAILY, Disp: 90 tablet, Rfl: 0   sodium chloride HYPERTONIC 3 % nebulizer solution, Take by nebulization 2 (two) times daily., Disp: 750 mL, Rfl: 12   Spacer/Aero-Holding Chambers (AEROCHAMBER MV) inhaler, Use as instructed, Disp: 1 each, Rfl: 0   TRELEGY ELLIPTA 100-62.5-25 MCG/INH AEPB, INHALE ONE PUFF BY MOUTH DAILY, Disp: 60 each, Rfl: 3   TURMERIC PO, Take 1,000 mg by mouth 2 (two) times daily. Takes 1,000 mg, Disp: , Rfl:    UNABLE TO FIND, Med Name: Mullien OTC, Disp: , Rfl:    vitamin A 10000 UNIT capsule, Take 5,000 Units by mouth daily. Per pt list - takes 25,000, Disp: , Rfl:   EXAM:   VITALS per patient if applicable: None reported  GENERAL: alert, oriented, appears well and in no acute distress  HEENT: atraumatic, conjunttiva clear, no obvious abnormalities on inspection of external nose and ears  NECK: normal movements of the head and neck  LUNGS: on inspection no signs of respiratory distress, breathing rate appears normal, no obvious gross increased work of breathing, gasping or wheezing  CV: no obvious cyanosis  MS: moves all visible extremities without noticeable abnormality  PSYCH/NEURO: pleasant and cooperative, appears tearful speech and thought processing grossly  intact  ASSESSMENT AND PLAN:   Hand cramps - Plan: CBC with Differential/Platelet, Comprehensive metabolic panel, Magnesium, IBC panel, Ferritin  Leg cramps  -I have asked her to come in for labs. -Our Education officer, museum has been involved with her, I have asked patient to discuss with her whether assisted living is a possibility, she seems hesitant to do this, tasks of daily living such as laundry, grooming are difficult for her.  I do not think that independent living is an option at this time.  Time spent: 35 minutes we have spent today discussing medical issues and living arrangements.    I discussed the assessment and treatment plan with the patient. The patient was provided an opportunity to ask questions and all were answered. The patient agreed with the plan and demonstrated an understanding of the instructions.   The patient was advised to call back or seek an in-person evaluation if the symptoms worsen or if the condition fails to improve as anticipated.    Lelon Frohlich, MD  [facility]

## 2021-01-09 ENCOUNTER — Ambulatory Visit: Payer: Medicare HMO | Admitting: Pulmonary Disease

## 2021-01-15 ENCOUNTER — Telehealth: Payer: Self-pay | Admitting: Internal Medicine

## 2021-01-15 ENCOUNTER — Other Ambulatory Visit: Payer: Self-pay | Admitting: Internal Medicine

## 2021-01-15 DIAGNOSIS — R252 Cramp and spasm: Secondary | ICD-10-CM

## 2021-01-15 DIAGNOSIS — R5383 Other fatigue: Secondary | ICD-10-CM

## 2021-01-15 NOTE — Telephone Encounter (Signed)
Patient called asking if she could complete her lab work at a location that is closer to her in [place]. She chose [facility] [address], [place], [place] [address].   Informed patient that a note would be sent back to Dr. Jerilee Hoh and her team and someone would reach out to her once the orders are sent over. Told patient that she may have to call and schedule an appointment at the [facility], as we do not know whether appointments are required, or what their processes are.  Patient's contact number is [phone].  Please advise.

## 2021-01-15 NOTE — Telephone Encounter (Signed)
Labs ordered and patient is aware

## 2021-02-07 ENCOUNTER — Other Ambulatory Visit: Payer: Self-pay | Admitting: Internal Medicine

## 2021-02-07 ENCOUNTER — Other Ambulatory Visit: Payer: Self-pay | Admitting: Pulmonary Disease

## 2021-02-07 DIAGNOSIS — J9610 Chronic respiratory failure, unspecified whether with hypoxia or hypercapnia: Secondary | ICD-10-CM

## 2021-02-07 DIAGNOSIS — J449 Chronic obstructive pulmonary disease, unspecified: Secondary | ICD-10-CM

## 2021-02-11 ENCOUNTER — Other Ambulatory Visit: Payer: Self-pay | Admitting: Internal Medicine

## 2021-02-18 ENCOUNTER — Telehealth: Payer: Self-pay | Admitting: Internal Medicine

## 2021-02-18 DIAGNOSIS — G4733 Obstructive sleep apnea (adult) (pediatric): Secondary | ICD-10-CM

## 2021-02-18 DIAGNOSIS — E8881 Metabolic syndrome: Secondary | ICD-10-CM

## 2021-02-18 DIAGNOSIS — M159 Polyosteoarthritis, unspecified: Secondary | ICD-10-CM

## 2021-02-18 DIAGNOSIS — N1832 Chronic kidney disease, stage 3b: Secondary | ICD-10-CM

## 2021-02-18 DIAGNOSIS — J9611 Chronic respiratory failure with hypoxia: Secondary | ICD-10-CM

## 2021-02-18 DIAGNOSIS — I872 Venous insufficiency (chronic) (peripheral): Secondary | ICD-10-CM

## 2021-02-18 DIAGNOSIS — K589 Irritable bowel syndrome without diarrhea: Secondary | ICD-10-CM

## 2021-02-18 DIAGNOSIS — J449 Chronic obstructive pulmonary disease, unspecified: Secondary | ICD-10-CM

## 2021-02-18 NOTE — Telephone Encounter (Signed)
Patient called because she contacted her insurance to inquire about in home health services and they let her know that if Dr.Hernandez sent an order into [facility] for it, they would pay. Patient states the nurse would come out and help her with things such as changing the bed sheets, slight cleaning and helping her bathe. I let patient know that I would send the message back and that Dr.Hernandez would send it if she could, when Patient asked if she would send it in. Patient verbalized understanding.    Please send order to [facility]    Please advise

## 2021-02-19 ENCOUNTER — Other Ambulatory Visit (INDEPENDENT_AMBULATORY_CARE_PROVIDER_SITE_OTHER): Payer: Medicare HMO

## 2021-02-19 ENCOUNTER — Telehealth: Payer: Self-pay | Admitting: *Deleted

## 2021-02-19 DIAGNOSIS — R252 Cramp and spasm: Secondary | ICD-10-CM | POA: Diagnosis not present

## 2021-02-19 DIAGNOSIS — R5383 Other fatigue: Secondary | ICD-10-CM

## 2021-02-19 LAB — COMPREHENSIVE METABOLIC PANEL
ALT: 17 U/L (ref 0–35)
AST: 20 U/L (ref 0–37)
Albumin: 3.9 g/dL (ref 3.5–5.2)
Alkaline Phosphatase: 62 U/L (ref 39–117)
BUN: 29 mg/dL — ABNORMAL HIGH (ref 6–23)
CO2: 32 mEq/L (ref 19–32)
Calcium: 9.7 mg/dL (ref 8.4–10.5)
Chloride: 101 mEq/L (ref 96–112)
Creatinine, Ser: 1.61 mg/dL — ABNORMAL HIGH (ref 0.40–1.20)
GFR: 31.81 mL/min — ABNORMAL LOW (ref >60.00)
Glucose, Bld: 97 mg/dL (ref 70–99)
Potassium: 4.2 mEq/L (ref 3.5–5.1)
Sodium: 142 mEq/L (ref 135–145)
Total Bilirubin: 0.5 mg/dL (ref 0.2–1.2)
Total Protein: 6.9 g/dL (ref 6.0–8.3)

## 2021-02-19 LAB — CBC WITH DIFFERENTIAL/PLATELET
Basophils Absolute: 0.1 10*3/uL (ref 0.0–0.1)
Basophils Relative: 0.4 % (ref 0.0–3.0)
Eosinophils Absolute: 0.2 10*3/uL (ref 0.0–0.7)
Eosinophils Relative: 1.1 % (ref 0.0–5.0)
HCT: 47.7 % — ABNORMAL HIGH (ref 36.0–46.0)
Hemoglobin: 15.2 g/dL — ABNORMAL HIGH (ref 12.0–15.0)
Lymphocytes Relative: 8.4 % — ABNORMAL LOW (ref 12.0–46.0)
Lymphs Abs: 1.5 10*3/uL (ref 0.7–4.0)
MCHC: 31.8 g/dL (ref 30.0–36.0)
MCV: 90.6 fl (ref 78.0–100.0)
Monocytes Absolute: 0.5 10*3/uL (ref 0.1–1.0)
Monocytes Relative: 3 % (ref 3.0–12.0)
Neutro Abs: 15.6 10*3/uL — ABNORMAL HIGH (ref 1.4–7.7)
Neutrophils Relative %: 87.1 % — ABNORMAL HIGH (ref 43.0–77.0)
Platelets: 281 10*3/uL (ref 150.0–400.0)
RBC: 5.27 Mil/uL — ABNORMAL HIGH (ref 3.87–5.11)
RDW: 17.6 % — ABNORMAL HIGH (ref 11.5–15.5)
WBC: 18 10*3/uL (ref 4.0–10.5)

## 2021-02-19 LAB — IBC PANEL
Iron: 68 ug/dL (ref 42–145)
Saturation Ratios: 19.2 % — ABNORMAL LOW (ref 20.0–50.0)
TIBC: 354.2 ug/dL (ref 250.0–450.0)
Transferrin: 253 mg/dL (ref 212.0–360.0)

## 2021-02-19 LAB — FERRITIN: Ferritin: 182.2 ng/mL (ref 10.0–291.0)

## 2021-02-19 LAB — MAGNESIUM: Magnesium: 2.2 mg/dL (ref 1.5–2.5)

## 2021-02-19 NOTE — Telephone Encounter (Signed)
Referral placed.

## 2021-02-19 NOTE — Telephone Encounter (Signed)
CRITICAL VALUE STICKER  CRITICAL VALUE: WBC 18.0  RECEIVER (on-site recipient of call): [place] NOTIFIED: 02/19/21   MESSENGER (representative from lab): Santiago Glad  MD NOTIFIED: Jerilee Hoh MD  TIME OF NOTIFICATION:4:00 pm  RESPONSE:  Dr Jerilee Hoh will notify

## 2021-02-20 ENCOUNTER — Other Ambulatory Visit: Payer: Self-pay | Admitting: Internal Medicine

## 2021-02-20 DIAGNOSIS — N1832 Chronic kidney disease, stage 3b: Secondary | ICD-10-CM

## 2021-02-20 NOTE — Telephone Encounter (Signed)
Notified pt of lab results 

## 2021-03-04 ENCOUNTER — Telehealth: Payer: Self-pay

## 2021-03-04 NOTE — Telephone Encounter (Signed)
Physical therapist from [facility] called and would like an order for physical therapy 1x week for 9 weeks. She can be reached at [phone]

## 2021-03-05 NOTE — Telephone Encounter (Signed)
LVM for Shelia to give v/o and CB if any questions.

## 2021-03-10 ENCOUNTER — Telehealth: Payer: Self-pay | Admitting: Internal Medicine

## 2021-03-10 NOTE — Telephone Encounter (Signed)
Gani an occupational therapist from [facility] called to get verbal orders for OT One week five starting last week. It will be home exercise program and respiratory muscle training.      Good callback number [phone]   Okay to leave voicemail if he does not answer

## 2021-03-11 NOTE — Telephone Encounter (Signed)
Verbal orders given to [facility] for One week five starting last week. It will be home exercise program and respiratory muscle training. Verb understanding.

## 2021-03-21 ENCOUNTER — Other Ambulatory Visit: Payer: Self-pay | Admitting: Internal Medicine

## 2021-03-21 ENCOUNTER — Other Ambulatory Visit: Payer: Self-pay | Admitting: Pulmonary Disease

## 2021-03-21 ENCOUNTER — Telehealth: Payer: Self-pay | Admitting: Pulmonary Disease

## 2021-03-21 DIAGNOSIS — F419 Anxiety disorder, unspecified: Secondary | ICD-10-CM

## 2021-03-21 DIAGNOSIS — J449 Chronic obstructive pulmonary disease, unspecified: Secondary | ICD-10-CM

## 2021-03-21 MED ORDER — METHYLPREDNISOLONE 2 MG PO TABS: ORAL_TABLET | ORAL | 5 refills | Status: AC

## 2021-03-21 NOTE — Telephone Encounter (Signed)
Called and spoke with patient and she stated that she needs a refill on her Medrol 2mg  prescription.   Is this approved?  Dr Vaughan Browner please advise

## 2021-03-23 NOTE — Telephone Encounter (Signed)
Last refill per controlled substance database: 02/08/21 Last OV: VV for hand cramps on 01/08/21 Next OV: none scheduled

## 2021-03-24 ENCOUNTER — Telehealth: Payer: Self-pay | Admitting: Pulmonary Disease

## 2021-03-24 NOTE — Telephone Encounter (Signed)
Pt states she is having trouble beathing. Has been going on since last week. States she had home health nurses check her out and everything was fine. when she coughs, she is coughing up pheglm but no color. Denied appt. [name] in [place].

## 2021-03-24 NOTE — Telephone Encounter (Signed)
Can try Mucinex DM Twice daily  As needed  cough/congestion , flutter valve and nebs  If not working will need ov for further evaluation   Please contact office for sooner follow up if symptoms do not improve or worsen or seek emergency care

## 2021-03-24 NOTE — Telephone Encounter (Signed)
Spoke with the pt and notified of response per TP  She verbalized understanding  She states she can not come in for appt due to not being able to wear a mask  I offered video visit but she does not want to do this either  Advised seek emergent care if needed

## 2021-03-24 NOTE — Telephone Encounter (Signed)
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   Has been tapered off prednisone due to weight gain but continues to have significant dyspnea affecting quality of life We discussed pros and cons of chronic steroid therapy and will try Medrol at 10 mg/day Start morphine 5 mg every 4 hours as needed for relief of her hunger and dyspnea Place a consult to palliative care Instructions given on [place] breathing techniques Continue flutter valve, hypertonic saline for mucociliary clearance  Was appointment offered to patient (explain)?  MyChart video visit was offered several times, she refused. Also refused in office visit, urgent care or ED.    Reason for call: Called and spoke with patient. She stated that she has been coughing more over the past few days. The cough has been productive with thick, clear phlegm. The phlegm has been so thick at times that she feels like she is choking. She denied any SOB, body aches or fever.   She has several home health nurses that visit her during the week. They have all listened to her lungs and stated that her lungs sound clear. No fevers, BP has been normal.   Dr. Vaughan Browner prescribed morphine for her last August 2022 but she has not used this. She is still taking the daily dose of Medrol 10mg . Still using her O2 24/7.  (examples of things to ask: : When did symptoms start? Fever? Cough? Productive? Color to sputum? More sputum than usual? Wheezing? Have you needed increased oxygen? Are you taking your respiratory medications? What over the counter measures have you tried?)  Allergies  Allergen Reactions   Keflex [Cephalexin] Diarrhea   Penicillins Nausea Only and Nausea And Vomiting     REACTION: nausea and vomiting REACTION: nausea and vomiting REACTION: nausea and vomiting    Immunization History  Administered Date(s) Administered   PFIZER(Purple Top)SARS-COV-2 Vaccination 07/18/2019, 08/07/2019, 04/03/2020   I explained to her that since she has not been seen since July 2022, it would be best that we get her in for an appt. She then stated how hard it is for her to move around and she doesn't feel comfortable driving her car. She several medications at the pharmacy that she can not get because she can not drive. I asked her if she had someone that could drive her but she did not answer.   I then explained that since she does not feel comfortable driving, her best would be to call 911. She refused because she does not want to leave her disabled dog at home. Again, I asked if she had someone who could take care of the dog in her absence and she never answered.    [facility] is [name] in [place].   TP, can you please advise since Dr. Vaughan Browner is not in office today and the patient requested you? Thanks!

## 2021-03-26 ENCOUNTER — Telehealth: Payer: Self-pay | Admitting: Internal Medicine

## 2021-03-26 NOTE — Telephone Encounter (Signed)
Will call [facility] in the morning to see why pt needs to go to urology instead.

## 2021-03-26 NOTE — Telephone Encounter (Signed)
Patient called because she was given a referral to nephrology but they contacted patient and told her she needed to go to urology because it sounded like she had kidney stones and they do not handle that. Patient would prefer that [facility]rologist is apart of [facility] and if possible near [place].     Good callback to patient is [phone]   Please advise

## 2021-03-26 NOTE — Telephone Encounter (Signed)
Okay to change to urology referral?

## 2021-03-27 NOTE — Telephone Encounter (Signed)
Spoke to [employer] at [employer] Pt's referral was rated a 3 & pt should be getting call from [facility] to schedule an appt in next 1-2 weeks. Dawn states they don't typically call the pt if referral is not necessary - they reach out to the referring office.  LVM instructions for pt to call office for clarification & notify her of above. Will also send mychart message.

## 2021-03-28 ENCOUNTER — Telehealth: Payer: Self-pay | Admitting: Internal Medicine

## 2021-03-28 NOTE — Telephone Encounter (Signed)
Rebecca [name] PT [facility] is calling to let dr know they d/c pt physical therapy due to pt is refusing physical therapy due to pt not feeling  .Pt  has a cough due to COPD. Rebecca [name] said md can send another order once pt is feeling better

## 2021-04-01 ENCOUNTER — Telehealth: Payer: Self-pay

## 2021-04-01 ENCOUNTER — Telehealth: Payer: Medicare HMO | Admitting: Internal Medicine

## 2021-04-01 NOTE — Telephone Encounter (Signed)
Spoke with the patient. Per Dr. Jerilee Hoh she will need to come into the office. Appointment for 04/01/2021 canceled and rescheduled for 04/02/2021 per patient request.

## 2021-04-02 ENCOUNTER — Ambulatory Visit (INDEPENDENT_AMBULATORY_CARE_PROVIDER_SITE_OTHER): Payer: Medicare HMO | Admitting: Internal Medicine

## 2021-04-02 VITALS — BP 139/87 | HR 95 | Temp 98.5°F | Resp 18

## 2021-04-02 DIAGNOSIS — J449 Chronic obstructive pulmonary disease, unspecified: Secondary | ICD-10-CM | POA: Diagnosis not present

## 2021-04-02 MED ORDER — METHYLPREDNISOLONE 4 MG PO TBPK: ORAL_TABLET | ORAL | 0 refills | Status: AC

## 2021-04-02 NOTE — Progress Notes (Signed)
Pt presents for chest discomfort and SOB states that she is coughing up pearl colored mucous and feels like something is in chest

## 2021-04-02 NOTE — Progress Notes (Signed)
Established Patient Office Visit     This visit occurred during the SARS-CoV-2 public health emergency.  Safety protocols were in place, including screening questions prior to the visit, additional usage of staff PPE, and extensive cleaning of exam room while observing appropriate contact time as indicated for disinfecting solutions.    CC/Reason for Visit: Shortness of breath  HPI: Rebecca [name] is a 71 y.o. female who is coming in today for the above mentioned reasons. Past Medical History is significant for: End-stage COPD.  She called yesterday requesting an appointment due to 3 weeks of progressive shortness of breath.  She apparently called her pulmonologist office and was told to take Mucinex and she did not agree with that and so decided to make an appointment at our office.  She says she takes 10 mg of Medrol daily, I am assuming this is either 10 mg of prednisone or 2 mg of Medrol but she is not able to clarify.  She is also on 250 mg of azithromycin daily.  She has been recommended to have palliative care by both myself and her pulmonologist in the past.  She is prescribed liquid morphine but she is afraid to take it because she understands that this is associated with end-of-life care.  She has not had palliative care visit with her yet.  She wonders if increase steroid use for a week or so might be beneficial.  She has not had any fever.  She states she is coughing and is bringing up copious amounts of sputum.   Past Medical/Surgical History: Past Medical History:  Diagnosis Date   Anxiety    Asthma    Back pain    Bilateral swelling of feet    Chest pain    Chest pain    Chewing difficulty    Chronic venous insufficiency    COPD (chronic obstructive pulmonary disease) ([name])    Depression    DJD (degenerative joint disease)    Drug use    Dysthymia    Emphysema/COPD ([name])    Fatty liver    Fibromyalgia    Gallbladder problem    GERD (gastroesophageal reflux  disease)    Gout    History of headache    Hodgkin lymphoma of extranodal or solid organ site [facility])    Hypothyroid    IBS (irritable bowel syndrome)    Infertility, female    Joint pain    Kidney problem    Metabolic syndrome X    Morbid obesity ([name])    OSA (obstructive sleep apnea)    Osteoarthritis    SOB (shortness of breath)    Swallowing difficulty    Vitamin B12 deficiency    Vitamin D deficiency     Past Surgical History:  Procedure Laterality Date   APPENDECTOMY     BILATERAL SALPINGOOPHORECTOMY  2007   [facility]   CHOLECYSTECTOMY  1982   LAPAROSCOPIC GASTRIC BANDING  08/2007   Dr. 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 Her smoking use included cigarettes. She started smoking about 59 years ago. She has a 29.00 pack-year smoking history. She has never used smokeless tobacco. She reports that she does not drink alcohol and does not use drugs.  Allergies: Allergies  Allergen Reactions   Keflex [Cephalexin] Diarrhea   Penicillins Nausea Only and  Nausea And Vomiting    REACTION: nausea and vomiting REACTION: nausea and vomiting REACTION: nausea and vomiting    Family History:  Family History  Problem Relation Age of Onset   Heart failure Father    Cancer Father    Parkinson's disease Mother    Endometrial cancer Mother    Thyroid disease Mother    Cancer Mother    Obesity Mother    Heart attack Sister    Diabetes Mellitus II Brother      Current Outpatient Medications:    methylPREDNISolone (MEDROL DOSEPAK) 4 MG TBPK tablet, Take as directed, Disp: 21 tablet, Rfl: 0   albuterol (VENTOLIN HFA) 108 (90 Base) MCG/ACT inhaler, INHALE TWO PUFFS INTO THE LUNGS EVERY 6 HOURS AS NEEDED FOR WHEEZING OR SHORTNESS OF BREATH, Disp: 18 g, Rfl: 11   ALPRAZolam (XANAX) 0.5 MG tablet, TAKE ONE TABLET BY MOUTH THREE TIMES A DAY AS NEEDED FOR  NERVES, DAILY MAX OF 3 TABLETS, Disp: 90 tablet, Rfl: 0   amoxicillin-clavulanate (AUGMENTIN) 875-125 MG tablet, Take 1 tablet by mouth 2 (two) times daily., Disp: 10 tablet, Rfl: 0   APPLE CIDER VINEGAR PO, Take 1,000 mg by mouth daily., Disp: , Rfl:    Ascorbic Acid (VITAMIN C) 1000 MG tablet, Take 1,000 mg by mouth daily., Disp: , Rfl:    betamethasone valerate ointment (VALISONE) 0.1 %, , Disp: , Rfl:    cetirizine (ZYRTEC) 10 MG tablet, Take 10 mg by mouth daily., Disp: , Rfl:    Cholecalciferol (VITAMIN D-3) 125 MCG (5000 UT) TABS, Take 1 tablet by mouth daily. Alternate 1k every other day with 2k, Disp: 30 tablet, Rfl:    CINNAMON PO, Take 2,000 mg by mouth every morning. Takes 2 every am, Disp: , Rfl:    colchicine 0.6 MG tablet, Take 1 tablet (0.6 mg total) by mouth daily., Disp: 30 tablet, Rfl: 0   furosemide (LASIX) 40 MG tablet, TAKE ONE TO TWO TABLETS BY MOUTH EVERY DAY AS NEEDED FOR SWELLING, Disp: 180 tablet, Rfl: 1   glucosamine-chondroitin 500-400 MG tablet, Take 2 tablets by mouth 2 (two) times daily. , Disp: , Rfl:    GUAIFENESIN ER PO, Take 400 mg by mouth 2 (two) times daily. Tales 3 tab;es twice a day, Disp: , Rfl:    HYDROcodone-acetaminophen (NORCO/VICODIN) 5-325 MG tablet, Take 1 tablet by mouth every 6 (six) hours as needed for moderate pain., Disp: 20 tablet, Rfl: 0   ipratropium-albuterol (DUONEB) 0.5-2.5 (3) MG/3ML SOLN, INHALE 1 VIAL VIA NEBULIZATION THREE TIMES A DAY, Disp: 270 mL, Rfl: 5   Magnesium 400 MG CAPS, Take 3 capsules by mouth 2 (two) times daily. , Disp: , Rfl:    methylPREDNISolone (MEDROL) 2 MG tablet, Take 10mg  daily, Disp: 150 tablet, Rfl: 5   Misc Natural Products (TART CHERRY ADVANCED PO), Take by mouth., Disp: , Rfl:    MITIGARE 0.6 MG CAPS, TAKE ONE CAPSULE BY MOUTH TWICE A DAY, Disp: 60 capsule, Rfl: 2   morphine 20 MG/5ML solution, Take 1.3 mLs (5.2 mg total) by mouth every 4 (four) hours as needed (dyspnea)., Disp: 30 mL, Rfl: 0   OVER THE  COUNTER MEDICATION, CBD Oil, Disp: , Rfl:    OXYGEN, Inhale 4 L into the lungs., Disp: , Rfl:    sertraline (ZOLOFT) 100 MG tablet, Take 1 tablet (100 mg total) by mouth daily., Disp: 90 tablet, Rfl: 1   sertraline (ZOLOFT) 50 MG tablet, TAKE ONE TABLET BY MOUTH DAILY, Disp: 90  tablet, Rfl: 0   sodium chloride HYPERTONIC 3 % nebulizer solution, Take by nebulization 2 (two) times daily., Disp: 750 mL, Rfl: 12   Spacer/Aero-Holding Chambers (AEROCHAMBER MV) inhaler, Use as instructed, Disp: 1 each, Rfl: 0   TRELEGY ELLIPTA 100-62.5-25 MCG/ACT AEPB, INHALE ONE PUFF BY MOUTH DAILY, Disp: 60 each, Rfl: 3   TURMERIC PO, Take 1,000 mg by mouth 2 (two) times daily. Takes 1,000 mg, Disp: , Rfl:    UNABLE TO FIND, Med Name: [place]ien OTC, Disp: , Rfl:    vitamin A 10000 UNIT capsule, Take 5,000 Units by mouth daily. Per pt list - takes 25,000, Disp: , Rfl:   Review of Systems:  Constitutional: Denies fever, chills, diaphoresis, appetite change. HEENT: Denies photophobia, eye pain, redness, hearing loss, ear pain, congestion, sore throat, rhinorrhea, sneezing, mouth sores, trouble swallowing, neck pain, neck stiffness and tinnitus.   Respiratory: Denies  chest tightness,  and wheezing.   Cardiovascular: Denies chest pain, palpitations and leg swelling.  Gastrointestinal: Denies nausea, vomiting, abdominal pain, diarrhea, constipation, blood in stool and abdominal distention.  Genitourinary: Denies dysuria, urgency, frequency, hematuria, flank pain and difficulty urinating.  Endocrine: Denies: hot or cold intolerance, sweats, changes in hair or nails, polyuria, polydipsia. Musculoskeletal: Denies myalgias, back pain, joint swelling, arthralgias and gait problem.  Skin: Denies pallor, rash and wound.  Neurological: Denies dizziness, seizures, syncope, weakness, light-headedness, numbness and headaches.  Hematological: Denies adenopathy. Easy bruising, personal or family bleeding history   Psychiatric/Behavioral: Denies suicidal ideation, mood changes, confusion, nervousness, sleep disturbance and agitation    Physical Exam: Vitals:   04/02/21 1601  BP: 139/87  Pulse: 95  Resp: 18  Temp: 98.5 F (36.9 C)  SpO2: 92%    There is no height or weight on file to calculate BMI.   Constitutional: NAD, calm, comfortable, very pale Eyes: PERRL, lids and conjunctivae normal ENMT: Mucous membranes are moist.  Respiratory: Poor air movement but clear to auscultation bilaterally, no wheezing, no crackles.  Cardiovascular: Regular rate and rhythm, no murmurs / rubs / gallops.   Psychiatric: Normal judgment and insight. Alert and oriented x 3. Normal mood.    Impression and Plan:  Stage 4 very severe COPD by GOLD classification ([place])  - Plan: methylPREDNISolone (MEDROL DOSEPAK) 4 MG TBPK tablet, Amb Referral to Palliative Care -I have told her that low-dose of liquid morphine that has already been prescribed to her by her pulmonologist might be beneficial to help with her air hunger.  She is prescribed 20 mg per 5 ml.  I have told her to start out with about 0.5 ml which would equal around 2 mg.  Time spent: 42 minutes reviewing chart, interviewing and examining patient, discussing treatment plan, discussing plan for palliative care referral and end-of-life care.     Lelon Frohlich, MD [facility]

## 2021-04-03 ENCOUNTER — Telehealth: Payer: Self-pay

## 2021-04-03 NOTE — Telephone Encounter (Signed)
Attempted to contact patient to schedule a Palliative Care consult appointment. No answer left a message to return call.  

## 2021-04-08 ENCOUNTER — Telehealth: Payer: Self-pay | Admitting: Pharmacy Technician

## 2021-04-08 ENCOUNTER — Telehealth: Payer: Self-pay

## 2021-04-08 NOTE — Telephone Encounter (Signed)
Attempted to contact patient and patient's friend [name] to schedule a Palliative Care consult appointment. No answer left a message to return call for patient. Spoke with [name] she states that patient makes her own appointments. She will call patient to advise to call back to schedule.

## 2021-04-08 NOTE — Telephone Encounter (Signed)
Spoke with patient and scheduled a Mychart Palliative Consult for 04/10/21 @ 12:30 PM.  Consent obtained; updated Outlook/Netsmart/Team List and Epic.

## 2021-04-10 ENCOUNTER — Other Ambulatory Visit: Payer: Self-pay

## 2021-04-10 ENCOUNTER — Telehealth: Payer: Medicaid Other | Admitting: Hospice

## 2021-04-10 DIAGNOSIS — Z515 Encounter for palliative care: Secondary | ICD-10-CM

## 2021-04-10 DIAGNOSIS — F419 Anxiety disorder, unspecified: Secondary | ICD-10-CM

## 2021-04-10 DIAGNOSIS — R0602 Shortness of breath: Secondary | ICD-10-CM

## 2021-04-10 DIAGNOSIS — J449 Chronic obstructive pulmonary disease, unspecified: Secondary | ICD-10-CM

## 2021-04-10 NOTE — Progress Notes (Addendum)
[facility] Consult Note Telephone: [phone]  Fax: [phone]  PATIENT NAME: Rebecca [name] [address] [place] [address] [phone] (home)  DOB: December 31, 1950 MRN: [id number]  PRIMARY CARE PROVIDER:    Isaac Bliss, Rayford Halsted, MD,  [place] [address] [phone]  REFERRING PROVIDER:   Isaac Bliss, Rayford Halsted, MD [address] [place],  [place] [address] [phone]  RESPONSIBLE PARTY:   Self Patient had horses and was active outdoors Contact Information     Name Relation Home Work Mobile   [place] Friend [phone]        TELEHEALTH VISIT STATEMENT Due to the COVID-19 crisis, this visit was done via telemedicine from my office and it was initiated and consent by this patient and or family.  I connected with patient OR PROXY by a telephone/video  and verified that I am speaking with the correct person. I discussed the limitations of evaluation and management by telemedicine. The patient expressed understanding and agreed to proceed. Palliative Care was asked to follow this patient to address advance care planning, complex medical decision making and goals of care clarification.   This is the initial visit.    ASSESSMENT AND / RECOMMENDATIONS:   Advance Care Planning: Our advance care planning conversation included a discussion about:    The value and importance of advance care planning  Difference between Hospice and Palliative care Exploration of goals of care in the event of a sudden injury or illness  Identification and preparation of a healthcare agent  Review and updating or creation of an  advance directive document . Decision not to resuscitate or to de-escalate disease focused treatments due to poor prognosis.  CODE STATUS:Patient is a Do Not Resuscitate  Goals of Care: Goals include to maximize quality of life and symptom management Patient  wishes to travel to [place] in [place]; she will go with her girl friend  I spent 16 minutes providing this initial consultation. More than 50% of the time in this consultation was spent on counseling patient and coordinating communication. --------------------------------------------------------------------------------------------------------------------------------------  Symptom Management/Plan:,  Shortness of breath: related to severe COPD. Continue with 02 supplementation and breathing treatments as ordered.  Slow deep breathing encouraged. Depression:Managed with Zoloft. Followed by behavioral psychologist Anxiety: Encourage distraction, calm approach, de-escalation.  Continue  Xanax as ordered.  Follow up: Palliative care will continue to follow for complex medical decision making, advance care planning, and clarification of goals. Return 6 weeks or prn. Encouraged to call provider sooner with any concerns.   Family /Caregiver/Community Supports: Patient has a room mate who helps her do some chores.  HOSPICE ELIGIBILITY/DIAGNOSIS: TBD  Chief Complaint: Initial Palliative care visit  HISTORY OF PRESENT ILLNESS:  Rebecca [name] is a 71 y.o. year old female  with multiple medical conditions including severe COPD with associated worsening shortness of breath.  History of anxiety, CKD, osteoarthritis, obesity.  Patient denies pain/discomfort; reports that she has morphine which she has not used for a long while.  She endorses shortness of breath and reports oxygen supplementation is helpful. History obtained from review of EMR, discussion with primary team, caregiver, family and/or Ms. Weigand.  Review and summarization of Epic records shows history from other than patient. Rest of 10 point ROS asked and negative.  I reviewed as needed, available labs, patient records, imaging, studies and related documents from the EMR.     PAST MEDICAL HISTORY:  Active Ambulatory Problems  Diagnosis Date Noted   Hypothyroidism 06/24/2007   Vitamin D deficiency 08/67/6195   METABOLIC SYNDROME X 09/32/6712   Morbid obesity ([facility]) 06/24/2007   DYSTHYMIA 06/24/2007   Obstructive sleep apnea 02/28/2007   Venous (peripheral) insufficiency 06/24/2007   Asthma with exacerbation 04/11/2009   COPD mixed type ([facility]) 02/28/2007   GERD 02/28/2007   Irritable bowel syndrome 02/28/2007   Osteoarthritis 06/24/2007   UTERINE CANCER, HX OF 06/24/2007   Chest pain, atypical 01/14/2011   Impaired glucose tolerance 12/08/2011   Chest pressure 05/25/2013   Chronic respiratory failure ([facility]) 02/15/2014   Left arm pain 03/10/2014   Biceps tendinitis on left 03/10/2014   Neck pain of over 3 months duration 03/10/2014   Olecranon bursitis of right elbow 05/17/2014   Pain, joint, ankle, left 45/80/9983   Diastolic dysfunction 38/25/0539   Nocturnal leg cramps 01/09/2015   Osteoarthritis of spine with radiculopathy, cervical region 07/31/2016   Degenerative arthritis of knee, bilateral 76/73/4193   Umbilical hernia without obstruction and without gangrene 01/18/2018   CKD (chronic kidney disease) stage 3, GFR 30-59 ml/min ([facility]) 03/22/2018   DNR (do not resuscitate) 04/05/2018   Takes dietary supplements 04/26/2018   Bradycardia with 31-40 beats per minute 01/04/2020   Resolved Ambulatory Problems    Diagnosis Date Noted   Headache(784.0) 06/24/2007   CHEST PAIN 06/15/2007   Type 2 diabetes mellitus ([facility]) 06/26/2011   Past Medical History:  Diagnosis Date   Anxiety    Asthma    Back pain    Bilateral swelling of feet    Chest pain    Chest pain    Chewing difficulty    Chronic venous insufficiency    COPD (chronic obstructive pulmonary disease) ([name])    Depression    DJD (degenerative joint disease)    Drug use    Dysthymia    Emphysema/COPD ([name])    Fatty liver    Fibromyalgia    Gallbladder problem    GERD (gastroesophageal reflux disease)    Gout    History of headache     Hodgkin lymphoma of extranodal or solid organ site ([facility])    Hypothyroid    IBS (irritable bowel syndrome)    Infertility, female    Joint pain    Kidney problem    Metabolic syndrome X    OSA (obstructive sleep apnea)    SOB (shortness of breath)    Swallowing difficulty    Vitamin B12 deficiency    Vitamin D deficiency     SOCIAL HX:  Social History   Tobacco Use   Smoking status: Former    Packs/day: 1.00    Years: 29.00    Pack years: 29.00    Types: Cigarettes    Start date: 03/02/1962    Quit date: 09/27/1991    Years since quitting: 29.5   Smokeless tobacco: Never  Substance Use Topics   Alcohol use: No    Alcohol/week: 0.0 standard drinks     FAMILY HX:  Family History  Problem Relation Age of Onset   Heart failure Father    Cancer Father    Parkinson's disease Mother    Endometrial cancer Mother    Thyroid disease Mother    Cancer Mother    Obesity Mother    Heart attack Sister    Diabetes Mellitus II Brother       ALLERGIES:  Allergies  Allergen Reactions   Keflex [Cephalexin] Diarrhea   Penicillins Nausea Only and Nausea And Vomiting  REACTION: nausea and vomiting REACTION: nausea and vomiting REACTION: nausea and vomiting      PERTINENT MEDICATIONS:  Outpatient Encounter Medications as of 04/10/2021  Medication Sig   albuterol (VENTOLIN HFA) 108 (90 Base) MCG/ACT inhaler INHALE TWO PUFFS INTO THE LUNGS EVERY 6 HOURS AS NEEDED FOR WHEEZING OR SHORTNESS OF BREATH   ALPRAZolam (XANAX) 0.5 MG tablet TAKE ONE TABLET BY MOUTH THREE TIMES A DAY AS NEEDED FOR NERVES, DAILY MAX OF 3 TABLETS   amoxicillin-clavulanate (AUGMENTIN) 875-125 MG tablet Take 1 tablet by mouth 2 (two) times daily.   APPLE CIDER VINEGAR PO Take 1,000 mg by mouth daily.   Ascorbic Acid (VITAMIN C) 1000 MG tablet Take 1,000 mg by mouth daily.   betamethasone valerate ointment (VALISONE) 0.1 %    cetirizine (ZYRTEC) 10 MG tablet Take 10 mg by mouth daily.   Cholecalciferol  (VITAMIN D-3) 125 MCG (5000 UT) TABS Take 1 tablet by mouth daily. Alternate 1k every other day with 2k   CINNAMON PO Take 2,000 mg by mouth every morning. Takes 2 every am   colchicine 0.6 MG tablet Take 1 tablet (0.6 mg total) by mouth daily.   furosemide (LASIX) 40 MG tablet TAKE ONE TO TWO TABLETS BY MOUTH EVERY DAY AS NEEDED FOR SWELLING   glucosamine-chondroitin 500-400 MG tablet Take 2 tablets by mouth 2 (two) times daily.    GUAIFENESIN ER PO Take 400 mg by mouth 2 (two) times daily. Tales 3 tab;es twice a day   HYDROcodone-acetaminophen (NORCO/VICODIN) 5-325 MG tablet Take 1 tablet by mouth every 6 (six) hours as needed for moderate pain.   ipratropium-albuterol (DUONEB) 0.5-2.5 (3) MG/3ML SOLN INHALE 1 VIAL VIA NEBULIZATION THREE TIMES A DAY   Magnesium 400 MG CAPS Take 3 capsules by mouth 2 (two) times daily.    methylPREDNISolone (MEDROL DOSEPAK) 4 MG TBPK tablet Take as directed   methylPREDNISolone (MEDROL) 2 MG tablet Take 10mg  daily   Misc Natural Products (TART CHERRY ADVANCED PO) Take by mouth.   MITIGARE 0.6 MG CAPS TAKE ONE CAPSULE BY MOUTH TWICE A DAY   morphine 20 MG/5ML solution Take 1.3 mLs (5.2 mg total) by mouth every 4 (four) hours as needed (dyspnea).   OVER THE COUNTER MEDICATION CBD Oil   OXYGEN Inhale 4 L into the lungs.   sertraline (ZOLOFT) 100 MG tablet Take 1 tablet (100 mg total) by mouth daily.   sertraline (ZOLOFT) 50 MG tablet TAKE ONE TABLET BY MOUTH DAILY   sodium chloride HYPERTONIC 3 % nebulizer solution Take by nebulization 2 (two) times daily.   Spacer/Aero-Holding Chambers (AEROCHAMBER MV) inhaler Use as instructed   TRELEGY ELLIPTA 100-62.5-25 MCG/ACT AEPB INHALE ONE PUFF BY MOUTH DAILY   TURMERIC PO Take 1,000 mg by mouth 2 (two) times daily. Takes 1,000 mg   UNABLE TO FIND Med Name: Mullien OTC   vitamin A 10000 UNIT capsule Take 5,000 Units by mouth daily. Per pt list - takes 25,000   No facility-administered encounter medications on file as  of 04/10/2021.     Thank you for the opportunity to participate in the care of Ms. Herrero.  The palliative care team will continue to follow. Please call our office at [phone] if we can be of additional assistance.   Note: Portions of this note were generated with Lobbyist. Dictation errors may occur despite best attempts at proofreading.  Teodoro Spray, NP

## 2021-04-12 DIAGNOSIS — R112 Nausea with vomiting, unspecified: Secondary | ICD-10-CM | POA: Diagnosis not present

## 2021-04-12 DIAGNOSIS — K5730 Diverticulosis of large intestine without perforation or abscess without bleeding: Secondary | ICD-10-CM | POA: Diagnosis not present

## 2021-04-12 DIAGNOSIS — R1011 Right upper quadrant pain: Secondary | ICD-10-CM | POA: Diagnosis not present

## 2021-04-12 DIAGNOSIS — R140 Abdominal distension (gaseous): Secondary | ICD-10-CM | POA: Diagnosis not present

## 2021-04-12 DIAGNOSIS — E559 Vitamin D deficiency, unspecified: Secondary | ICD-10-CM | POA: Diagnosis not present

## 2021-04-12 DIAGNOSIS — I959 Hypotension, unspecified: Secondary | ICD-10-CM | POA: Diagnosis not present

## 2021-04-12 DIAGNOSIS — K469 Unspecified abdominal hernia without obstruction or gangrene: Secondary | ICD-10-CM | POA: Diagnosis not present

## 2021-04-12 DIAGNOSIS — F419 Anxiety disorder, unspecified: Secondary | ICD-10-CM | POA: Diagnosis not present

## 2021-04-12 DIAGNOSIS — Z7189 Other specified counseling: Secondary | ICD-10-CM | POA: Diagnosis not present

## 2021-04-12 DIAGNOSIS — N200 Calculus of kidney: Secondary | ICD-10-CM | POA: Diagnosis not present

## 2021-04-12 DIAGNOSIS — Z4682 Encounter for fitting and adjustment of non-vascular catheter: Secondary | ICD-10-CM | POA: Diagnosis not present

## 2021-04-12 DIAGNOSIS — K429 Umbilical hernia without obstruction or gangrene: Secondary | ICD-10-CM | POA: Diagnosis not present

## 2021-04-12 DIAGNOSIS — Z6841 Body Mass Index (BMI) 40.0 and over, adult: Secondary | ICD-10-CM | POA: Diagnosis not present

## 2021-04-12 DIAGNOSIS — Z9049 Acquired absence of other specified parts of digestive tract: Secondary | ICD-10-CM | POA: Diagnosis not present

## 2021-04-12 DIAGNOSIS — M259 Joint disorder, unspecified: Secondary | ICD-10-CM | POA: Diagnosis not present

## 2021-04-12 DIAGNOSIS — R7401 Elevation of levels of liver transaminase levels: Secondary | ICD-10-CM | POA: Diagnosis not present

## 2021-04-12 DIAGNOSIS — K5900 Constipation, unspecified: Secondary | ICD-10-CM | POA: Diagnosis not present

## 2021-04-12 DIAGNOSIS — I2510 Atherosclerotic heart disease of native coronary artery without angina pectoris: Secondary | ICD-10-CM | POA: Diagnosis not present

## 2021-04-12 DIAGNOSIS — R0602 Shortness of breath: Secondary | ICD-10-CM | POA: Diagnosis not present

## 2021-04-12 DIAGNOSIS — J9611 Chronic respiratory failure with hypoxia: Secondary | ICD-10-CM | POA: Diagnosis not present

## 2021-04-12 DIAGNOSIS — R197 Diarrhea, unspecified: Secondary | ICD-10-CM | POA: Diagnosis not present

## 2021-04-12 DIAGNOSIS — A419 Sepsis, unspecified organism: Secondary | ICD-10-CM | POA: Diagnosis not present

## 2021-04-12 DIAGNOSIS — R918 Other nonspecific abnormal finding of lung field: Secondary | ICD-10-CM | POA: Diagnosis not present

## 2021-04-12 DIAGNOSIS — R0789 Other chest pain: Secondary | ICD-10-CM | POA: Diagnosis not present

## 2021-04-12 DIAGNOSIS — J9811 Atelectasis: Secondary | ICD-10-CM | POA: Diagnosis not present

## 2021-04-12 DIAGNOSIS — J439 Emphysema, unspecified: Secondary | ICD-10-CM | POA: Diagnosis not present

## 2021-04-12 DIAGNOSIS — N179 Acute kidney failure, unspecified: Secondary | ICD-10-CM | POA: Diagnosis not present

## 2021-04-12 DIAGNOSIS — K56600 Partial intestinal obstruction, unspecified as to cause: Secondary | ICD-10-CM | POA: Diagnosis not present

## 2021-04-12 DIAGNOSIS — S63041A Subluxation of carpometacarpal joint of right thumb, initial encounter: Secondary | ICD-10-CM | POA: Diagnosis not present

## 2021-04-12 DIAGNOSIS — J9601 Acute respiratory failure with hypoxia: Secondary | ICD-10-CM | POA: Diagnosis not present

## 2021-04-12 DIAGNOSIS — F32A Depression, unspecified: Secondary | ICD-10-CM | POA: Diagnosis not present

## 2021-04-12 DIAGNOSIS — R5381 Other malaise: Secondary | ICD-10-CM | POA: Diagnosis not present

## 2021-04-12 DIAGNOSIS — J449 Chronic obstructive pulmonary disease, unspecified: Secondary | ICD-10-CM | POA: Diagnosis not present

## 2021-04-12 DIAGNOSIS — K5989 Other specified functional intestinal disorders: Secondary | ICD-10-CM | POA: Diagnosis not present

## 2021-04-12 DIAGNOSIS — R000 Tachycardia, unspecified: Secondary | ICD-10-CM | POA: Diagnosis not present

## 2021-04-12 DIAGNOSIS — J984 Other disorders of lung: Secondary | ICD-10-CM | POA: Diagnosis not present

## 2021-04-12 DIAGNOSIS — R1111 Vomiting without nausea: Secondary | ICD-10-CM | POA: Diagnosis not present

## 2021-04-12 DIAGNOSIS — R160 Hepatomegaly, not elsewhere classified: Secondary | ICD-10-CM | POA: Diagnosis not present

## 2021-04-12 DIAGNOSIS — I872 Venous insufficiency (chronic) (peripheral): Secondary | ICD-10-CM | POA: Diagnosis not present

## 2021-04-12 DIAGNOSIS — Z9981 Dependence on supplemental oxygen: Secondary | ICD-10-CM | POA: Diagnosis not present

## 2021-04-12 DIAGNOSIS — Z452 Encounter for adjustment and management of vascular access device: Secondary | ICD-10-CM | POA: Diagnosis not present

## 2021-04-12 DIAGNOSIS — K436 Other and unspecified ventral hernia with obstruction, without gangrene: Secondary | ICD-10-CM | POA: Diagnosis not present

## 2021-04-12 DIAGNOSIS — R1084 Generalized abdominal pain: Secondary | ICD-10-CM | POA: Diagnosis not present

## 2021-04-12 DIAGNOSIS — J432 Centrilobular emphysema: Secondary | ICD-10-CM | POA: Diagnosis not present

## 2021-04-12 DIAGNOSIS — G8929 Other chronic pain: Secondary | ICD-10-CM | POA: Diagnosis not present

## 2021-04-12 DIAGNOSIS — Z515 Encounter for palliative care: Secondary | ICD-10-CM | POA: Diagnosis not present

## 2021-04-12 DIAGNOSIS — R109 Unspecified abdominal pain: Secondary | ICD-10-CM | POA: Diagnosis not present

## 2021-04-12 DIAGNOSIS — R110 Nausea: Secondary | ICD-10-CM | POA: Diagnosis not present

## 2021-04-12 DIAGNOSIS — N281 Cyst of kidney, acquired: Secondary | ICD-10-CM | POA: Diagnosis not present

## 2021-04-12 DIAGNOSIS — K219 Gastro-esophageal reflux disease without esophagitis: Secondary | ICD-10-CM | POA: Diagnosis not present

## 2021-04-12 DIAGNOSIS — J9621 Acute and chronic respiratory failure with hypoxia: Secondary | ICD-10-CM | POA: Diagnosis not present

## 2021-04-12 DIAGNOSIS — I517 Cardiomegaly: Secondary | ICD-10-CM | POA: Diagnosis not present

## 2021-04-12 DIAGNOSIS — R0600 Dyspnea, unspecified: Secondary | ICD-10-CM | POA: Diagnosis not present

## 2021-04-12 DIAGNOSIS — Z7901 Long term (current) use of anticoagulants: Secondary | ICD-10-CM | POA: Diagnosis not present

## 2021-04-12 DIAGNOSIS — R6521 Severe sepsis with septic shock: Secondary | ICD-10-CM | POA: Diagnosis not present

## 2021-04-12 DIAGNOSIS — D72829 Elevated white blood cell count, unspecified: Secondary | ICD-10-CM | POA: Diagnosis not present

## 2021-04-12 DIAGNOSIS — G4733 Obstructive sleep apnea (adult) (pediatric): Secondary | ICD-10-CM | POA: Diagnosis not present

## 2021-04-12 DIAGNOSIS — K56699 Other intestinal obstruction unspecified as to partial versus complete obstruction: Secondary | ICD-10-CM | POA: Diagnosis not present

## 2021-04-12 DIAGNOSIS — K56609 Unspecified intestinal obstruction, unspecified as to partial versus complete obstruction: Secondary | ICD-10-CM | POA: Diagnosis not present

## 2021-04-12 DIAGNOSIS — Z743 Need for continuous supervision: Secondary | ICD-10-CM | POA: Diagnosis not present

## 2021-04-12 DIAGNOSIS — J90 Pleural effusion, not elsewhere classified: Secondary | ICD-10-CM | POA: Diagnosis not present

## 2021-04-13 DIAGNOSIS — N179 Acute kidney failure, unspecified: Secondary | ICD-10-CM | POA: Diagnosis not present

## 2021-04-13 DIAGNOSIS — J449 Chronic obstructive pulmonary disease, unspecified: Secondary | ICD-10-CM | POA: Diagnosis not present

## 2021-04-13 DIAGNOSIS — Z6841 Body Mass Index (BMI) 40.0 and over, adult: Secondary | ICD-10-CM | POA: Diagnosis not present

## 2021-04-13 DIAGNOSIS — G4733 Obstructive sleep apnea (adult) (pediatric): Secondary | ICD-10-CM | POA: Diagnosis not present

## 2021-04-14 MED ORDER — METHYLPREDNISOLONE 2 MG PO TABS: ORAL_TABLET | ORAL | 5 refills | Status: AC

## 2021-04-14 NOTE — Telephone Encounter (Signed)
New methylprednisolone prescription sent to pharmacy.  Nothing further at this time.

## 2021-04-14 NOTE — Telephone Encounter (Signed)
Medrol is the same as methylprednisolone.  It is the brand name for this medication.  Please send in a new order for methylprednisolone

## 2021-04-18 DIAGNOSIS — K469 Unspecified abdominal hernia without obstruction or gangrene: Secondary | ICD-10-CM | POA: Diagnosis not present

## 2021-04-21 ENCOUNTER — Other Ambulatory Visit: Payer: Medicaid Other | Admitting: Hospice

## 2021-04-21 ENCOUNTER — Other Ambulatory Visit: Payer: Self-pay

## 2021-04-28 ENCOUNTER — Telehealth: Payer: Self-pay | Admitting: Internal Medicine

## 2021-04-28 NOTE — Telephone Encounter (Signed)
Patient called in requesting to speak with Dr.Hernandez to tell her what's going on with her because she's in [facility] in [place]. Patient stated that she's been in there since 02/11.  Patient could be contacted at [phone].  Please advise.

## 2021-04-30 NOTE — Telephone Encounter (Signed)
FYI -  ? ?Patient is going to rehab center tomorrow.  Patient had a bowel obstruction with complications.   ?

## 2021-05-01 DIAGNOSIS — J449 Chronic obstructive pulmonary disease, unspecified: Secondary | ICD-10-CM | POA: Diagnosis not present

## 2021-05-01 DIAGNOSIS — M259 Joint disorder, unspecified: Secondary | ICD-10-CM | POA: Diagnosis not present

## 2021-05-01 DIAGNOSIS — Z66 Do not resuscitate: Secondary | ICD-10-CM | POA: Diagnosis not present

## 2021-05-01 DIAGNOSIS — J439 Emphysema, unspecified: Secondary | ICD-10-CM | POA: Diagnosis not present

## 2021-05-01 DIAGNOSIS — G4733 Obstructive sleep apnea (adult) (pediatric): Secondary | ICD-10-CM | POA: Diagnosis not present

## 2021-05-01 DIAGNOSIS — K56609 Unspecified intestinal obstruction, unspecified as to partial versus complete obstruction: Secondary | ICD-10-CM | POA: Diagnosis not present

## 2021-05-01 DIAGNOSIS — M1990 Unspecified osteoarthritis, unspecified site: Secondary | ICD-10-CM | POA: Diagnosis not present

## 2021-05-01 DIAGNOSIS — M25061 Hemarthrosis, right knee: Secondary | ICD-10-CM | POA: Diagnosis not present

## 2021-05-01 DIAGNOSIS — L98419 Non-pressure chronic ulcer of buttock with unspecified severity: Secondary | ICD-10-CM | POA: Diagnosis not present

## 2021-05-01 DIAGNOSIS — R0600 Dyspnea, unspecified: Secondary | ICD-10-CM | POA: Diagnosis not present

## 2021-05-01 DIAGNOSIS — K611 Rectal abscess: Secondary | ICD-10-CM | POA: Diagnosis not present

## 2021-05-01 DIAGNOSIS — I872 Venous insufficiency (chronic) (peripheral): Secondary | ICD-10-CM | POA: Diagnosis not present

## 2021-05-01 DIAGNOSIS — F324 Major depressive disorder, single episode, in partial remission: Secondary | ICD-10-CM | POA: Diagnosis not present

## 2021-05-01 DIAGNOSIS — E559 Vitamin D deficiency, unspecified: Secondary | ICD-10-CM | POA: Diagnosis not present

## 2021-05-01 DIAGNOSIS — S82141A Displaced bicondylar fracture of right tibia, initial encounter for closed fracture: Secondary | ICD-10-CM | POA: Diagnosis not present

## 2021-05-01 DIAGNOSIS — R0902 Hypoxemia: Secondary | ICD-10-CM | POA: Diagnosis not present

## 2021-05-01 DIAGNOSIS — G8929 Other chronic pain: Secondary | ICD-10-CM | POA: Diagnosis not present

## 2021-05-01 DIAGNOSIS — R0602 Shortness of breath: Secondary | ICD-10-CM | POA: Diagnosis not present

## 2021-05-01 DIAGNOSIS — W19XXXA Unspecified fall, initial encounter: Secondary | ICD-10-CM | POA: Diagnosis not present

## 2021-05-01 DIAGNOSIS — R5381 Other malaise: Secondary | ICD-10-CM | POA: Diagnosis not present

## 2021-05-01 DIAGNOSIS — I2699 Other pulmonary embolism without acute cor pulmonale: Secondary | ICD-10-CM | POA: Diagnosis not present

## 2021-05-01 DIAGNOSIS — Z743 Need for continuous supervision: Secondary | ICD-10-CM | POA: Diagnosis not present

## 2021-05-01 DIAGNOSIS — G4489 Other headache syndrome: Secondary | ICD-10-CM | POA: Diagnosis not present

## 2021-05-01 DIAGNOSIS — N390 Urinary tract infection, site not specified: Secondary | ICD-10-CM | POA: Diagnosis not present

## 2021-05-01 DIAGNOSIS — F419 Anxiety disorder, unspecified: Secondary | ICD-10-CM | POA: Diagnosis not present

## 2021-05-01 DIAGNOSIS — M25561 Pain in right knee: Secondary | ICD-10-CM | POA: Diagnosis not present

## 2021-05-01 DIAGNOSIS — J9621 Acute and chronic respiratory failure with hypoxia: Secondary | ICD-10-CM | POA: Diagnosis not present

## 2021-05-01 DIAGNOSIS — K219 Gastro-esophageal reflux disease without esophagitis: Secondary | ICD-10-CM | POA: Diagnosis not present

## 2021-05-01 DIAGNOSIS — I959 Hypotension, unspecified: Secondary | ICD-10-CM | POA: Diagnosis not present

## 2021-05-01 DIAGNOSIS — J984 Other disorders of lung: Secondary | ICD-10-CM | POA: Diagnosis not present

## 2021-05-01 DIAGNOSIS — R918 Other nonspecific abnormal finding of lung field: Secondary | ICD-10-CM | POA: Diagnosis not present

## 2021-05-01 DIAGNOSIS — R079 Chest pain, unspecified: Secondary | ICD-10-CM | POA: Diagnosis not present

## 2021-05-01 DIAGNOSIS — E876 Hypokalemia: Secondary | ICD-10-CM | POA: Diagnosis not present

## 2021-05-01 DIAGNOSIS — Z1621 Resistance to vancomycin: Secondary | ICD-10-CM | POA: Diagnosis not present

## 2021-05-01 DIAGNOSIS — N200 Calculus of kidney: Secondary | ICD-10-CM | POA: Diagnosis not present

## 2021-05-01 DIAGNOSIS — R0789 Other chest pain: Secondary | ICD-10-CM | POA: Diagnosis not present

## 2021-05-02 DIAGNOSIS — F324 Major depressive disorder, single episode, in partial remission: Secondary | ICD-10-CM | POA: Diagnosis not present

## 2021-05-02 DIAGNOSIS — K56609 Unspecified intestinal obstruction, unspecified as to partial versus complete obstruction: Secondary | ICD-10-CM | POA: Diagnosis not present

## 2021-05-02 DIAGNOSIS — M1990 Unspecified osteoarthritis, unspecified site: Secondary | ICD-10-CM | POA: Diagnosis not present

## 2021-05-02 DIAGNOSIS — F419 Anxiety disorder, unspecified: Secondary | ICD-10-CM | POA: Diagnosis not present

## 2021-05-02 DIAGNOSIS — J449 Chronic obstructive pulmonary disease, unspecified: Secondary | ICD-10-CM | POA: Diagnosis not present

## 2021-05-02 DIAGNOSIS — M25561 Pain in right knee: Secondary | ICD-10-CM | POA: Diagnosis not present

## 2021-05-02 DIAGNOSIS — K219 Gastro-esophageal reflux disease without esophagitis: Secondary | ICD-10-CM | POA: Diagnosis not present

## 2021-05-08 DIAGNOSIS — N281 Cyst of kidney, acquired: Secondary | ICD-10-CM | POA: Diagnosis not present

## 2021-05-08 DIAGNOSIS — N200 Calculus of kidney: Secondary | ICD-10-CM | POA: Diagnosis not present

## 2021-05-08 DIAGNOSIS — N1330 Unspecified hydronephrosis: Secondary | ICD-10-CM | POA: Diagnosis not present

## 2021-05-08 DIAGNOSIS — J9611 Chronic respiratory failure with hypoxia: Secondary | ICD-10-CM | POA: Diagnosis not present

## 2021-05-08 DIAGNOSIS — B9620 Unspecified Escherichia coli [E. coli] as the cause of diseases classified elsewhere: Secondary | ICD-10-CM | POA: Diagnosis not present

## 2021-05-08 DIAGNOSIS — Z86718 Personal history of other venous thrombosis and embolism: Secondary | ICD-10-CM | POA: Diagnosis not present

## 2021-05-08 DIAGNOSIS — S82141A Displaced bicondylar fracture of right tibia, initial encounter for closed fracture: Secondary | ICD-10-CM | POA: Diagnosis not present

## 2021-05-08 DIAGNOSIS — R0789 Other chest pain: Secondary | ICD-10-CM | POA: Diagnosis not present

## 2021-05-08 DIAGNOSIS — R0902 Hypoxemia: Secondary | ICD-10-CM | POA: Diagnosis not present

## 2021-05-08 DIAGNOSIS — R0600 Dyspnea, unspecified: Secondary | ICD-10-CM | POA: Diagnosis not present

## 2021-05-08 DIAGNOSIS — S82144D Nondisplaced bicondylar fracture of right tibia, subsequent encounter for closed fracture with routine healing: Secondary | ICD-10-CM | POA: Diagnosis not present

## 2021-05-08 DIAGNOSIS — M1811 Unilateral primary osteoarthritis of first carpometacarpal joint, right hand: Secondary | ICD-10-CM | POA: Diagnosis not present

## 2021-05-08 DIAGNOSIS — G4489 Other headache syndrome: Secondary | ICD-10-CM | POA: Diagnosis not present

## 2021-05-08 DIAGNOSIS — L98419 Non-pressure chronic ulcer of buttock with unspecified severity: Secondary | ICD-10-CM | POA: Diagnosis not present

## 2021-05-08 DIAGNOSIS — G4733 Obstructive sleep apnea (adult) (pediatric): Secondary | ICD-10-CM | POA: Diagnosis not present

## 2021-05-08 DIAGNOSIS — Z48817 Encounter for surgical aftercare following surgery on the skin and subcutaneous tissue: Secondary | ICD-10-CM | POA: Diagnosis not present

## 2021-05-08 DIAGNOSIS — I2699 Other pulmonary embolism without acute cor pulmonale: Secondary | ICD-10-CM | POA: Diagnosis not present

## 2021-05-08 DIAGNOSIS — Z9981 Dependence on supplemental oxygen: Secondary | ICD-10-CM | POA: Diagnosis not present

## 2021-05-08 DIAGNOSIS — Z66 Do not resuscitate: Secondary | ICD-10-CM | POA: Diagnosis not present

## 2021-05-08 DIAGNOSIS — M7989 Other specified soft tissue disorders: Secondary | ICD-10-CM | POA: Diagnosis not present

## 2021-05-08 DIAGNOSIS — N261 Atrophy of kidney (terminal): Secondary | ICD-10-CM | POA: Diagnosis not present

## 2021-05-08 DIAGNOSIS — R0602 Shortness of breath: Secondary | ICD-10-CM | POA: Diagnosis not present

## 2021-05-08 DIAGNOSIS — I959 Hypotension, unspecified: Secondary | ICD-10-CM | POA: Diagnosis not present

## 2021-05-08 DIAGNOSIS — R112 Nausea with vomiting, unspecified: Secondary | ICD-10-CM | POA: Diagnosis not present

## 2021-05-08 DIAGNOSIS — J439 Emphysema, unspecified: Secondary | ICD-10-CM | POA: Diagnosis not present

## 2021-05-08 DIAGNOSIS — E876 Hypokalemia: Secondary | ICD-10-CM | POA: Diagnosis not present

## 2021-05-08 DIAGNOSIS — J9600 Acute respiratory failure, unspecified whether with hypoxia or hypercapnia: Secondary | ICD-10-CM | POA: Diagnosis not present

## 2021-05-08 DIAGNOSIS — R918 Other nonspecific abnormal finding of lung field: Secondary | ICD-10-CM | POA: Diagnosis not present

## 2021-05-08 DIAGNOSIS — J449 Chronic obstructive pulmonary disease, unspecified: Secondary | ICD-10-CM | POA: Diagnosis not present

## 2021-05-08 DIAGNOSIS — J9601 Acute respiratory failure with hypoxia: Secondary | ICD-10-CM | POA: Diagnosis not present

## 2021-05-08 DIAGNOSIS — Z1621 Resistance to vancomycin: Secondary | ICD-10-CM | POA: Diagnosis not present

## 2021-05-08 DIAGNOSIS — M25561 Pain in right knee: Secondary | ICD-10-CM | POA: Diagnosis not present

## 2021-05-08 DIAGNOSIS — N390 Urinary tract infection, site not specified: Secondary | ICD-10-CM | POA: Diagnosis not present

## 2021-05-08 DIAGNOSIS — K219 Gastro-esophageal reflux disease without esophagitis: Secondary | ICD-10-CM | POA: Diagnosis not present

## 2021-05-08 DIAGNOSIS — R079 Chest pain, unspecified: Secondary | ICD-10-CM | POA: Diagnosis not present

## 2021-05-08 DIAGNOSIS — R319 Hematuria, unspecified: Secondary | ICD-10-CM | POA: Diagnosis not present

## 2021-05-08 DIAGNOSIS — K56609 Unspecified intestinal obstruction, unspecified as to partial versus complete obstruction: Secondary | ICD-10-CM | POA: Diagnosis not present

## 2021-05-08 DIAGNOSIS — J9621 Acute and chronic respiratory failure with hypoxia: Secondary | ICD-10-CM | POA: Diagnosis not present

## 2021-05-08 DIAGNOSIS — S82144A Nondisplaced bicondylar fracture of right tibia, initial encounter for closed fracture: Secondary | ICD-10-CM | POA: Diagnosis not present

## 2021-05-08 DIAGNOSIS — R310 Gross hematuria: Secondary | ICD-10-CM | POA: Diagnosis not present

## 2021-05-08 DIAGNOSIS — S83232D Complex tear of medial meniscus, current injury, left knee, subsequent encounter: Secondary | ICD-10-CM | POA: Diagnosis not present

## 2021-05-08 DIAGNOSIS — K611 Rectal abscess: Secondary | ICD-10-CM | POA: Diagnosis not present

## 2021-05-08 DIAGNOSIS — K5730 Diverticulosis of large intestine without perforation or abscess without bleeding: Secondary | ICD-10-CM | POA: Diagnosis not present

## 2021-05-08 DIAGNOSIS — M1711 Unilateral primary osteoarthritis, right knee: Secondary | ICD-10-CM | POA: Diagnosis not present

## 2021-05-08 DIAGNOSIS — J984 Other disorders of lung: Secondary | ICD-10-CM | POA: Diagnosis not present

## 2021-05-08 DIAGNOSIS — M25061 Hemarthrosis, right knee: Secondary | ICD-10-CM | POA: Diagnosis not present

## 2021-05-09 DIAGNOSIS — I2699 Other pulmonary embolism without acute cor pulmonale: Secondary | ICD-10-CM | POA: Diagnosis not present

## 2021-05-09 DIAGNOSIS — M7989 Other specified soft tissue disorders: Secondary | ICD-10-CM | POA: Diagnosis not present

## 2021-05-10 DIAGNOSIS — I2699 Other pulmonary embolism without acute cor pulmonale: Secondary | ICD-10-CM | POA: Diagnosis not present

## 2021-05-11 DIAGNOSIS — I2699 Other pulmonary embolism without acute cor pulmonale: Secondary | ICD-10-CM | POA: Diagnosis not present

## 2021-05-12 ENCOUNTER — Other Ambulatory Visit: Payer: Self-pay

## 2021-05-12 DIAGNOSIS — K56609 Unspecified intestinal obstruction, unspecified as to partial versus complete obstruction: Secondary | ICD-10-CM | POA: Diagnosis not present

## 2021-05-12 DIAGNOSIS — I2699 Other pulmonary embolism without acute cor pulmonale: Secondary | ICD-10-CM | POA: Diagnosis not present

## 2021-05-12 NOTE — Patient Outreach (Signed)
[facility]) Care Management ? ?05/12/2021 ? ?Erin Sons [place] ?1950-03-26 ?[id number] ? ? ? ? ?Transition of Care Referral ? ?Referral Date: 05/12/2021 ?Referral Source:Discharge Report ?Date of Discharge:05/08/2021 ?Facility: [facility] ? ? ? ? ?Referral received. Upon chart review and Patient Ping patient currently inpatient at [facility].  ? ? ? ?Plan: ?RN CM will close referral.  ? ? ?Enzo Montgomery, RN,BSN,CCM ?[facility] Care Management ?Telephonic Care Management Coordinator ?Direct Phone: [phone] ?Toll Free: [phone] ?Fax: [phone] ? ?

## 2021-05-13 DIAGNOSIS — K56609 Unspecified intestinal obstruction, unspecified as to partial versus complete obstruction: Secondary | ICD-10-CM | POA: Diagnosis not present

## 2021-05-13 DIAGNOSIS — I2699 Other pulmonary embolism without acute cor pulmonale: Secondary | ICD-10-CM | POA: Diagnosis not present

## 2021-05-14 DIAGNOSIS — I2699 Other pulmonary embolism without acute cor pulmonale: Secondary | ICD-10-CM | POA: Diagnosis not present

## 2021-05-14 DIAGNOSIS — K56609 Unspecified intestinal obstruction, unspecified as to partial versus complete obstruction: Secondary | ICD-10-CM | POA: Diagnosis not present

## 2021-05-15 DIAGNOSIS — K56609 Unspecified intestinal obstruction, unspecified as to partial versus complete obstruction: Secondary | ICD-10-CM | POA: Diagnosis not present

## 2021-05-15 DIAGNOSIS — I2699 Other pulmonary embolism without acute cor pulmonale: Secondary | ICD-10-CM | POA: Diagnosis not present

## 2021-05-16 DIAGNOSIS — N200 Calculus of kidney: Secondary | ICD-10-CM | POA: Diagnosis not present

## 2021-05-16 DIAGNOSIS — R310 Gross hematuria: Secondary | ICD-10-CM | POA: Diagnosis not present

## 2021-05-16 DIAGNOSIS — I2699 Other pulmonary embolism without acute cor pulmonale: Secondary | ICD-10-CM | POA: Diagnosis not present

## 2021-05-16 DIAGNOSIS — N261 Atrophy of kidney (terminal): Secondary | ICD-10-CM | POA: Diagnosis not present

## 2021-05-16 DIAGNOSIS — K56609 Unspecified intestinal obstruction, unspecified as to partial versus complete obstruction: Secondary | ICD-10-CM | POA: Diagnosis not present

## 2021-05-18 DIAGNOSIS — J439 Emphysema, unspecified: Secondary | ICD-10-CM | POA: Diagnosis not present

## 2021-05-20 DIAGNOSIS — R310 Gross hematuria: Secondary | ICD-10-CM | POA: Diagnosis not present

## 2021-05-20 DIAGNOSIS — N200 Calculus of kidney: Secondary | ICD-10-CM | POA: Diagnosis not present

## 2021-05-20 DIAGNOSIS — R112 Nausea with vomiting, unspecified: Secondary | ICD-10-CM | POA: Diagnosis not present

## 2021-05-21 DIAGNOSIS — N200 Calculus of kidney: Secondary | ICD-10-CM | POA: Diagnosis not present

## 2021-05-21 DIAGNOSIS — N281 Cyst of kidney, acquired: Secondary | ICD-10-CM | POA: Diagnosis not present

## 2021-05-21 DIAGNOSIS — K5730 Diverticulosis of large intestine without perforation or abscess without bleeding: Secondary | ICD-10-CM | POA: Diagnosis not present

## 2021-05-21 DIAGNOSIS — N1330 Unspecified hydronephrosis: Secondary | ICD-10-CM | POA: Diagnosis not present

## 2021-05-22 DIAGNOSIS — K219 Gastro-esophageal reflux disease without esophagitis: Secondary | ICD-10-CM | POA: Diagnosis not present

## 2021-05-22 DIAGNOSIS — J449 Chronic obstructive pulmonary disease, unspecified: Secondary | ICD-10-CM | POA: Diagnosis not present

## 2021-05-22 DIAGNOSIS — N390 Urinary tract infection, site not specified: Secondary | ICD-10-CM | POA: Diagnosis not present

## 2021-05-22 DIAGNOSIS — B9620 Unspecified Escherichia coli [E. coli] as the cause of diseases classified elsewhere: Secondary | ICD-10-CM | POA: Diagnosis not present

## 2021-05-22 DIAGNOSIS — J9611 Chronic respiratory failure with hypoxia: Secondary | ICD-10-CM | POA: Diagnosis not present

## 2021-05-22 DIAGNOSIS — J9601 Acute respiratory failure with hypoxia: Secondary | ICD-10-CM | POA: Diagnosis not present

## 2021-05-22 DIAGNOSIS — N200 Calculus of kidney: Secondary | ICD-10-CM | POA: Diagnosis not present

## 2021-05-22 DIAGNOSIS — K611 Rectal abscess: Secondary | ICD-10-CM | POA: Diagnosis not present

## 2021-05-22 DIAGNOSIS — Z86718 Personal history of other venous thrombosis and embolism: Secondary | ICD-10-CM | POA: Diagnosis not present

## 2021-05-22 DIAGNOSIS — R310 Gross hematuria: Secondary | ICD-10-CM | POA: Diagnosis not present

## 2021-05-22 DIAGNOSIS — Z9981 Dependence on supplemental oxygen: Secondary | ICD-10-CM | POA: Diagnosis not present

## 2021-05-22 DIAGNOSIS — I2699 Other pulmonary embolism without acute cor pulmonale: Secondary | ICD-10-CM | POA: Diagnosis not present

## 2021-05-23 DIAGNOSIS — I2699 Other pulmonary embolism without acute cor pulmonale: Secondary | ICD-10-CM | POA: Diagnosis not present

## 2021-05-23 DIAGNOSIS — N390 Urinary tract infection, site not specified: Secondary | ICD-10-CM | POA: Diagnosis not present

## 2021-05-23 DIAGNOSIS — K611 Rectal abscess: Secondary | ICD-10-CM | POA: Diagnosis not present

## 2021-05-23 DIAGNOSIS — J9611 Chronic respiratory failure with hypoxia: Secondary | ICD-10-CM | POA: Diagnosis not present

## 2021-05-23 DIAGNOSIS — J449 Chronic obstructive pulmonary disease, unspecified: Secondary | ICD-10-CM | POA: Diagnosis not present

## 2021-05-23 DIAGNOSIS — R918 Other nonspecific abnormal finding of lung field: Secondary | ICD-10-CM | POA: Diagnosis not present

## 2021-05-23 DIAGNOSIS — B9620 Unspecified Escherichia coli [E. coli] as the cause of diseases classified elsewhere: Secondary | ICD-10-CM | POA: Diagnosis not present

## 2021-05-23 DIAGNOSIS — R0602 Shortness of breath: Secondary | ICD-10-CM | POA: Diagnosis not present

## 2021-05-23 DIAGNOSIS — J984 Other disorders of lung: Secondary | ICD-10-CM | POA: Diagnosis not present

## 2021-05-23 DIAGNOSIS — J9601 Acute respiratory failure with hypoxia: Secondary | ICD-10-CM | POA: Diagnosis not present

## 2021-05-24 DIAGNOSIS — N390 Urinary tract infection, site not specified: Secondary | ICD-10-CM | POA: Diagnosis not present

## 2021-05-24 DIAGNOSIS — J449 Chronic obstructive pulmonary disease, unspecified: Secondary | ICD-10-CM | POA: Diagnosis not present

## 2021-05-24 DIAGNOSIS — J9611 Chronic respiratory failure with hypoxia: Secondary | ICD-10-CM | POA: Diagnosis not present

## 2021-05-24 DIAGNOSIS — K611 Rectal abscess: Secondary | ICD-10-CM | POA: Diagnosis not present

## 2021-05-24 DIAGNOSIS — J9601 Acute respiratory failure with hypoxia: Secondary | ICD-10-CM | POA: Diagnosis not present

## 2021-05-24 DIAGNOSIS — I2699 Other pulmonary embolism without acute cor pulmonale: Secondary | ICD-10-CM | POA: Diagnosis not present

## 2021-05-24 DIAGNOSIS — B9620 Unspecified Escherichia coli [E. coli] as the cause of diseases classified elsewhere: Secondary | ICD-10-CM | POA: Diagnosis not present

## 2021-05-25 DIAGNOSIS — J449 Chronic obstructive pulmonary disease, unspecified: Secondary | ICD-10-CM | POA: Diagnosis not present

## 2021-05-25 DIAGNOSIS — B9620 Unspecified Escherichia coli [E. coli] as the cause of diseases classified elsewhere: Secondary | ICD-10-CM | POA: Diagnosis not present

## 2021-05-25 DIAGNOSIS — J9611 Chronic respiratory failure with hypoxia: Secondary | ICD-10-CM | POA: Diagnosis not present

## 2021-05-25 DIAGNOSIS — I2699 Other pulmonary embolism without acute cor pulmonale: Secondary | ICD-10-CM | POA: Diagnosis not present

## 2021-05-25 DIAGNOSIS — K611 Rectal abscess: Secondary | ICD-10-CM | POA: Diagnosis not present

## 2021-05-25 DIAGNOSIS — N390 Urinary tract infection, site not specified: Secondary | ICD-10-CM | POA: Diagnosis not present

## 2021-05-25 DIAGNOSIS — J9601 Acute respiratory failure with hypoxia: Secondary | ICD-10-CM | POA: Diagnosis not present

## 2021-05-26 DIAGNOSIS — J449 Chronic obstructive pulmonary disease, unspecified: Secondary | ICD-10-CM | POA: Diagnosis not present

## 2021-05-26 DIAGNOSIS — B9620 Unspecified Escherichia coli [E. coli] as the cause of diseases classified elsewhere: Secondary | ICD-10-CM | POA: Diagnosis not present

## 2021-05-26 DIAGNOSIS — I2699 Other pulmonary embolism without acute cor pulmonale: Secondary | ICD-10-CM | POA: Diagnosis not present

## 2021-05-26 DIAGNOSIS — J9601 Acute respiratory failure with hypoxia: Secondary | ICD-10-CM | POA: Diagnosis not present

## 2021-05-26 DIAGNOSIS — K611 Rectal abscess: Secondary | ICD-10-CM | POA: Diagnosis not present

## 2021-05-26 DIAGNOSIS — N390 Urinary tract infection, site not specified: Secondary | ICD-10-CM | POA: Diagnosis not present

## 2021-05-26 DIAGNOSIS — J9611 Chronic respiratory failure with hypoxia: Secondary | ICD-10-CM | POA: Diagnosis not present

## 2021-05-27 DIAGNOSIS — N390 Urinary tract infection, site not specified: Secondary | ICD-10-CM | POA: Diagnosis not present

## 2021-05-27 DIAGNOSIS — B9620 Unspecified Escherichia coli [E. coli] as the cause of diseases classified elsewhere: Secondary | ICD-10-CM | POA: Diagnosis not present

## 2021-05-27 DIAGNOSIS — J449 Chronic obstructive pulmonary disease, unspecified: Secondary | ICD-10-CM | POA: Diagnosis not present

## 2021-05-27 DIAGNOSIS — J9611 Chronic respiratory failure with hypoxia: Secondary | ICD-10-CM | POA: Diagnosis not present

## 2021-05-27 DIAGNOSIS — I2699 Other pulmonary embolism without acute cor pulmonale: Secondary | ICD-10-CM | POA: Diagnosis not present

## 2021-05-27 DIAGNOSIS — J9601 Acute respiratory failure with hypoxia: Secondary | ICD-10-CM | POA: Diagnosis not present

## 2021-05-27 DIAGNOSIS — K611 Rectal abscess: Secondary | ICD-10-CM | POA: Diagnosis not present

## 2021-05-27 DIAGNOSIS — M1711 Unilateral primary osteoarthritis, right knee: Secondary | ICD-10-CM | POA: Diagnosis not present

## 2021-05-28 DIAGNOSIS — I2699 Other pulmonary embolism without acute cor pulmonale: Secondary | ICD-10-CM | POA: Diagnosis not present

## 2021-05-29 DIAGNOSIS — I2699 Other pulmonary embolism without acute cor pulmonale: Secondary | ICD-10-CM | POA: Diagnosis not present

## 2021-05-30 DIAGNOSIS — M1811 Unilateral primary osteoarthritis of first carpometacarpal joint, right hand: Secondary | ICD-10-CM | POA: Diagnosis not present

## 2021-05-30 DIAGNOSIS — R319 Hematuria, unspecified: Secondary | ICD-10-CM | POA: Diagnosis not present

## 2021-05-30 DIAGNOSIS — I2699 Other pulmonary embolism without acute cor pulmonale: Secondary | ICD-10-CM | POA: Diagnosis not present

## 2021-05-31 DIAGNOSIS — I2699 Other pulmonary embolism without acute cor pulmonale: Secondary | ICD-10-CM | POA: Diagnosis not present

## 2021-06-01 DIAGNOSIS — R319 Hematuria, unspecified: Secondary | ICD-10-CM | POA: Diagnosis not present

## 2021-06-01 DIAGNOSIS — I2699 Other pulmonary embolism without acute cor pulmonale: Secondary | ICD-10-CM | POA: Diagnosis not present

## 2021-06-02 DIAGNOSIS — I2699 Other pulmonary embolism without acute cor pulmonale: Secondary | ICD-10-CM | POA: Diagnosis not present

## 2021-06-03 DIAGNOSIS — N390 Urinary tract infection, site not specified: Secondary | ICD-10-CM | POA: Diagnosis not present

## 2021-06-03 DIAGNOSIS — I2699 Other pulmonary embolism without acute cor pulmonale: Secondary | ICD-10-CM | POA: Diagnosis not present

## 2021-06-03 DIAGNOSIS — B9620 Unspecified Escherichia coli [E. coli] as the cause of diseases classified elsewhere: Secondary | ICD-10-CM | POA: Diagnosis not present

## 2021-06-03 DIAGNOSIS — K611 Rectal abscess: Secondary | ICD-10-CM | POA: Diagnosis not present

## 2021-06-03 DIAGNOSIS — J449 Chronic obstructive pulmonary disease, unspecified: Secondary | ICD-10-CM | POA: Diagnosis not present

## 2021-06-04 DIAGNOSIS — J449 Chronic obstructive pulmonary disease, unspecified: Secondary | ICD-10-CM | POA: Diagnosis not present

## 2021-06-04 DIAGNOSIS — B9620 Unspecified Escherichia coli [E. coli] as the cause of diseases classified elsewhere: Secondary | ICD-10-CM | POA: Diagnosis not present

## 2021-06-04 DIAGNOSIS — N390 Urinary tract infection, site not specified: Secondary | ICD-10-CM | POA: Diagnosis not present

## 2021-06-04 DIAGNOSIS — I2699 Other pulmonary embolism without acute cor pulmonale: Secondary | ICD-10-CM | POA: Diagnosis not present

## 2021-06-04 DIAGNOSIS — K611 Rectal abscess: Secondary | ICD-10-CM | POA: Diagnosis not present

## 2021-06-05 DIAGNOSIS — K611 Rectal abscess: Secondary | ICD-10-CM | POA: Diagnosis not present

## 2021-06-05 DIAGNOSIS — I2699 Other pulmonary embolism without acute cor pulmonale: Secondary | ICD-10-CM | POA: Diagnosis not present

## 2021-06-05 DIAGNOSIS — N390 Urinary tract infection, site not specified: Secondary | ICD-10-CM | POA: Diagnosis not present

## 2021-06-05 DIAGNOSIS — B9620 Unspecified Escherichia coli [E. coli] as the cause of diseases classified elsewhere: Secondary | ICD-10-CM | POA: Diagnosis not present

## 2021-06-05 DIAGNOSIS — J449 Chronic obstructive pulmonary disease, unspecified: Secondary | ICD-10-CM | POA: Diagnosis not present

## 2021-06-06 DIAGNOSIS — I959 Hypotension, unspecified: Secondary | ICD-10-CM | POA: Diagnosis not present

## 2021-06-06 DIAGNOSIS — Z48817 Encounter for surgical aftercare following surgery on the skin and subcutaneous tissue: Secondary | ICD-10-CM | POA: Diagnosis not present

## 2021-06-06 DIAGNOSIS — G4733 Obstructive sleep apnea (adult) (pediatric): Secondary | ICD-10-CM | POA: Diagnosis not present

## 2021-06-06 DIAGNOSIS — J9611 Chronic respiratory failure with hypoxia: Secondary | ICD-10-CM | POA: Diagnosis not present

## 2021-06-06 DIAGNOSIS — I10 Essential (primary) hypertension: Secondary | ICD-10-CM | POA: Diagnosis not present

## 2021-06-06 DIAGNOSIS — R2689 Other abnormalities of gait and mobility: Secondary | ICD-10-CM | POA: Diagnosis not present

## 2021-06-06 DIAGNOSIS — I2699 Other pulmonary embolism without acute cor pulmonale: Secondary | ICD-10-CM | POA: Diagnosis not present

## 2021-06-06 DIAGNOSIS — B9620 Unspecified Escherichia coli [E. coli] as the cause of diseases classified elsewhere: Secondary | ICD-10-CM | POA: Diagnosis not present

## 2021-06-06 DIAGNOSIS — M6281 Muscle weakness (generalized): Secondary | ICD-10-CM | POA: Diagnosis not present

## 2021-06-06 DIAGNOSIS — M25561 Pain in right knee: Secondary | ICD-10-CM | POA: Diagnosis not present

## 2021-06-06 DIAGNOSIS — J9600 Acute respiratory failure, unspecified whether with hypoxia or hypercapnia: Secondary | ICD-10-CM | POA: Diagnosis not present

## 2021-06-06 DIAGNOSIS — B372 Candidiasis of skin and nail: Secondary | ICD-10-CM | POA: Diagnosis not present

## 2021-06-06 DIAGNOSIS — N390 Urinary tract infection, site not specified: Secondary | ICD-10-CM | POA: Diagnosis not present

## 2021-06-06 DIAGNOSIS — S82144D Nondisplaced bicondylar fracture of right tibia, subsequent encounter for closed fracture with routine healing: Secondary | ICD-10-CM | POA: Diagnosis not present

## 2021-06-06 DIAGNOSIS — K611 Rectal abscess: Secondary | ICD-10-CM | POA: Diagnosis not present

## 2021-06-06 DIAGNOSIS — F419 Anxiety disorder, unspecified: Secondary | ICD-10-CM | POA: Diagnosis not present

## 2021-06-06 DIAGNOSIS — F4323 Adjustment disorder with mixed anxiety and depressed mood: Secondary | ICD-10-CM | POA: Diagnosis not present

## 2021-06-06 DIAGNOSIS — F5102 Adjustment insomnia: Secondary | ICD-10-CM | POA: Diagnosis not present

## 2021-06-06 DIAGNOSIS — Z9981 Dependence on supplemental oxygen: Secondary | ICD-10-CM | POA: Diagnosis not present

## 2021-06-06 DIAGNOSIS — F339 Major depressive disorder, recurrent, unspecified: Secondary | ICD-10-CM | POA: Diagnosis not present

## 2021-06-06 DIAGNOSIS — Z9889 Other specified postprocedural states: Secondary | ICD-10-CM | POA: Diagnosis not present

## 2021-06-06 DIAGNOSIS — S83232D Complex tear of medial meniscus, current injury, left knee, subsequent encounter: Secondary | ICD-10-CM | POA: Diagnosis not present

## 2021-06-06 DIAGNOSIS — K5904 Chronic idiopathic constipation: Secondary | ICD-10-CM | POA: Diagnosis not present

## 2021-06-06 DIAGNOSIS — J449 Chronic obstructive pulmonary disease, unspecified: Secondary | ICD-10-CM | POA: Diagnosis not present

## 2021-06-07 DIAGNOSIS — J449 Chronic obstructive pulmonary disease, unspecified: Secondary | ICD-10-CM | POA: Diagnosis not present

## 2021-06-07 DIAGNOSIS — F419 Anxiety disorder, unspecified: Secondary | ICD-10-CM | POA: Diagnosis not present

## 2021-06-07 DIAGNOSIS — F339 Major depressive disorder, recurrent, unspecified: Secondary | ICD-10-CM | POA: Diagnosis not present

## 2021-06-07 DIAGNOSIS — I2699 Other pulmonary embolism without acute cor pulmonale: Secondary | ICD-10-CM | POA: Diagnosis not present

## 2021-06-10 DIAGNOSIS — K5904 Chronic idiopathic constipation: Secondary | ICD-10-CM | POA: Diagnosis not present

## 2021-06-10 DIAGNOSIS — Z9889 Other specified postprocedural states: Secondary | ICD-10-CM | POA: Diagnosis not present

## 2021-06-10 DIAGNOSIS — K611 Rectal abscess: Secondary | ICD-10-CM | POA: Diagnosis not present

## 2021-06-10 DIAGNOSIS — I10 Essential (primary) hypertension: Secondary | ICD-10-CM | POA: Diagnosis not present

## 2021-06-16 DIAGNOSIS — M6281 Muscle weakness (generalized): Secondary | ICD-10-CM | POA: Diagnosis not present

## 2021-06-16 DIAGNOSIS — R2689 Other abnormalities of gait and mobility: Secondary | ICD-10-CM | POA: Diagnosis not present

## 2021-06-17 ENCOUNTER — Other Ambulatory Visit: Payer: Self-pay

## 2021-06-17 DIAGNOSIS — M6281 Muscle weakness (generalized): Secondary | ICD-10-CM | POA: Diagnosis not present

## 2021-06-17 DIAGNOSIS — R2689 Other abnormalities of gait and mobility: Secondary | ICD-10-CM | POA: Diagnosis not present

## 2021-06-17 NOTE — Patient Outreach (Signed)
[facility]) Care Management ? ?06/17/2021 ? ?Erin Sons [place] ?1950/08/25 ?[id number] ? ? ?[employer] member referral from SNF. Request sent to Jon Billings, RN for follow up. ? ?Ina Homes ?[facility]-Care Management Assistant ?[phone]  ?

## 2021-06-17 NOTE — Patient Outreach (Signed)
[facility]) Care Management ? ?06/17/2021 ? ?Rebecca [name] ?Jul 19, 1950 ?[id number] ? ? ?Referral Date: 06/17/21 ?Referral Source: [employer] Report ?Date of Discharge: 06/16/21 ?Facility:  [facility] ?Insurance: [employer] ? ?Per PING patient remains inpatient.  Telephone call to [facility], Patient remains at facility.   ? ?Plan: ?RN CM will close case.   ? ? ?Jone Baseman, RN, MSN ?[facility] Care Management ?Care Management Coordinator ?Direct Line [phone] ?Toll Free: [phone]  ?Fax: [phone] ? ?

## 2021-06-18 DIAGNOSIS — S82144D Nondisplaced bicondylar fracture of right tibia, subsequent encounter for closed fracture with routine healing: Secondary | ICD-10-CM | POA: Diagnosis not present

## 2021-06-18 DIAGNOSIS — M6281 Muscle weakness (generalized): Secondary | ICD-10-CM | POA: Diagnosis not present

## 2021-06-18 DIAGNOSIS — F5102 Adjustment insomnia: Secondary | ICD-10-CM | POA: Diagnosis not present

## 2021-06-18 DIAGNOSIS — F4323 Adjustment disorder with mixed anxiety and depressed mood: Secondary | ICD-10-CM | POA: Diagnosis not present

## 2021-06-18 DIAGNOSIS — R2689 Other abnormalities of gait and mobility: Secondary | ICD-10-CM | POA: Diagnosis not present

## 2021-06-18 DIAGNOSIS — I2699 Other pulmonary embolism without acute cor pulmonale: Secondary | ICD-10-CM | POA: Diagnosis not present

## 2021-06-19 DIAGNOSIS — R2689 Other abnormalities of gait and mobility: Secondary | ICD-10-CM | POA: Diagnosis not present

## 2021-06-19 DIAGNOSIS — M6281 Muscle weakness (generalized): Secondary | ICD-10-CM | POA: Diagnosis not present

## 2021-06-19 DIAGNOSIS — B372 Candidiasis of skin and nail: Secondary | ICD-10-CM | POA: Diagnosis not present

## 2021-06-20 DIAGNOSIS — R2689 Other abnormalities of gait and mobility: Secondary | ICD-10-CM | POA: Diagnosis not present

## 2021-06-20 DIAGNOSIS — M6281 Muscle weakness (generalized): Secondary | ICD-10-CM | POA: Diagnosis not present

## 2021-06-22 DIAGNOSIS — E7439 Other disorders of intestinal carbohydrate absorption: Secondary | ICD-10-CM | POA: Diagnosis not present

## 2021-06-22 DIAGNOSIS — J9611 Chronic respiratory failure with hypoxia: Secondary | ICD-10-CM | POA: Diagnosis not present

## 2021-06-22 DIAGNOSIS — J449 Chronic obstructive pulmonary disease, unspecified: Secondary | ICD-10-CM | POA: Diagnosis not present

## 2021-06-22 DIAGNOSIS — I517 Cardiomegaly: Secondary | ICD-10-CM | POA: Diagnosis not present

## 2021-06-22 DIAGNOSIS — S8991XA Unspecified injury of right lower leg, initial encounter: Secondary | ICD-10-CM | POA: Diagnosis not present

## 2021-06-22 DIAGNOSIS — N200 Calculus of kidney: Secondary | ICD-10-CM | POA: Diagnosis not present

## 2021-06-22 DIAGNOSIS — M25561 Pain in right knee: Secondary | ICD-10-CM | POA: Diagnosis not present

## 2021-06-22 DIAGNOSIS — M79604 Pain in right leg: Secondary | ICD-10-CM | POA: Diagnosis not present

## 2021-06-22 DIAGNOSIS — I10 Essential (primary) hypertension: Secondary | ICD-10-CM | POA: Diagnosis not present

## 2021-06-22 DIAGNOSIS — S82141D Displaced bicondylar fracture of right tibia, subsequent encounter for closed fracture with routine healing: Secondary | ICD-10-CM | POA: Diagnosis not present

## 2021-06-22 DIAGNOSIS — R197 Diarrhea, unspecified: Secondary | ICD-10-CM | POA: Diagnosis not present

## 2021-06-22 DIAGNOSIS — D739 Disease of spleen, unspecified: Secondary | ICD-10-CM | POA: Diagnosis not present

## 2021-06-22 DIAGNOSIS — I700 Atherosclerosis of aorta: Secondary | ICD-10-CM | POA: Diagnosis not present

## 2021-06-22 DIAGNOSIS — K219 Gastro-esophageal reflux disease without esophagitis: Secondary | ICD-10-CM | POA: Diagnosis not present

## 2021-06-22 DIAGNOSIS — R531 Weakness: Secondary | ICD-10-CM | POA: Diagnosis not present

## 2021-06-22 DIAGNOSIS — J439 Emphysema, unspecified: Secondary | ICD-10-CM | POA: Diagnosis not present

## 2021-06-22 DIAGNOSIS — R600 Localized edema: Secondary | ICD-10-CM | POA: Diagnosis not present

## 2021-06-22 DIAGNOSIS — K5790 Diverticulosis of intestine, part unspecified, without perforation or abscess without bleeding: Secondary | ICD-10-CM | POA: Diagnosis not present

## 2021-06-22 DIAGNOSIS — N3000 Acute cystitis without hematuria: Secondary | ICD-10-CM | POA: Diagnosis not present

## 2021-06-23 DIAGNOSIS — D539 Nutritional anemia, unspecified: Secondary | ICD-10-CM | POA: Diagnosis not present

## 2021-06-23 DIAGNOSIS — N200 Calculus of kidney: Secondary | ICD-10-CM | POA: Diagnosis not present

## 2021-06-23 DIAGNOSIS — R531 Weakness: Secondary | ICD-10-CM | POA: Diagnosis not present

## 2021-06-23 DIAGNOSIS — I2782 Chronic pulmonary embolism: Secondary | ICD-10-CM | POA: Diagnosis not present

## 2021-06-23 DIAGNOSIS — J449 Chronic obstructive pulmonary disease, unspecified: Secondary | ICD-10-CM | POA: Diagnosis not present

## 2021-06-23 DIAGNOSIS — G4733 Obstructive sleep apnea (adult) (pediatric): Secondary | ICD-10-CM | POA: Diagnosis not present

## 2021-06-23 DIAGNOSIS — S82141D Displaced bicondylar fracture of right tibia, subsequent encounter for closed fracture with routine healing: Secondary | ICD-10-CM | POA: Diagnosis not present

## 2021-06-23 DIAGNOSIS — J9611 Chronic respiratory failure with hypoxia: Secondary | ICD-10-CM | POA: Diagnosis not present

## 2021-06-24 DIAGNOSIS — I700 Atherosclerosis of aorta: Secondary | ICD-10-CM | POA: Diagnosis not present

## 2021-06-24 DIAGNOSIS — G4733 Obstructive sleep apnea (adult) (pediatric): Secondary | ICD-10-CM | POA: Diagnosis not present

## 2021-06-24 DIAGNOSIS — I2782 Chronic pulmonary embolism: Secondary | ICD-10-CM | POA: Diagnosis not present

## 2021-06-24 DIAGNOSIS — R531 Weakness: Secondary | ICD-10-CM | POA: Diagnosis not present

## 2021-06-24 DIAGNOSIS — D539 Nutritional anemia, unspecified: Secondary | ICD-10-CM | POA: Diagnosis not present

## 2021-06-24 DIAGNOSIS — K5790 Diverticulosis of intestine, part unspecified, without perforation or abscess without bleeding: Secondary | ICD-10-CM | POA: Diagnosis not present

## 2021-06-24 DIAGNOSIS — N200 Calculus of kidney: Secondary | ICD-10-CM | POA: Diagnosis not present

## 2021-06-24 DIAGNOSIS — S82141D Displaced bicondylar fracture of right tibia, subsequent encounter for closed fracture with routine healing: Secondary | ICD-10-CM | POA: Diagnosis not present

## 2021-06-24 DIAGNOSIS — J449 Chronic obstructive pulmonary disease, unspecified: Secondary | ICD-10-CM | POA: Diagnosis not present

## 2021-06-24 DIAGNOSIS — D739 Disease of spleen, unspecified: Secondary | ICD-10-CM | POA: Diagnosis not present

## 2021-06-24 DIAGNOSIS — J9611 Chronic respiratory failure with hypoxia: Secondary | ICD-10-CM | POA: Diagnosis not present

## 2021-06-25 DIAGNOSIS — J449 Chronic obstructive pulmonary disease, unspecified: Secondary | ICD-10-CM | POA: Diagnosis not present

## 2021-06-25 DIAGNOSIS — R531 Weakness: Secondary | ICD-10-CM | POA: Diagnosis not present

## 2021-06-25 DIAGNOSIS — I2782 Chronic pulmonary embolism: Secondary | ICD-10-CM | POA: Diagnosis not present

## 2021-06-25 DIAGNOSIS — S82141D Displaced bicondylar fracture of right tibia, subsequent encounter for closed fracture with routine healing: Secondary | ICD-10-CM | POA: Diagnosis not present

## 2021-06-25 DIAGNOSIS — J9611 Chronic respiratory failure with hypoxia: Secondary | ICD-10-CM | POA: Diagnosis not present

## 2021-06-25 DIAGNOSIS — D539 Nutritional anemia, unspecified: Secondary | ICD-10-CM | POA: Diagnosis not present

## 2021-06-25 DIAGNOSIS — G4733 Obstructive sleep apnea (adult) (pediatric): Secondary | ICD-10-CM | POA: Diagnosis not present

## 2021-06-25 DIAGNOSIS — N200 Calculus of kidney: Secondary | ICD-10-CM | POA: Diagnosis not present

## 2021-06-26 DIAGNOSIS — S82141D Displaced bicondylar fracture of right tibia, subsequent encounter for closed fracture with routine healing: Secondary | ICD-10-CM | POA: Diagnosis not present

## 2021-06-26 DIAGNOSIS — R531 Weakness: Secondary | ICD-10-CM | POA: Diagnosis not present

## 2021-06-26 DIAGNOSIS — J9611 Chronic respiratory failure with hypoxia: Secondary | ICD-10-CM | POA: Diagnosis not present

## 2021-06-26 DIAGNOSIS — N200 Calculus of kidney: Secondary | ICD-10-CM | POA: Diagnosis not present

## 2021-06-26 DIAGNOSIS — I2782 Chronic pulmonary embolism: Secondary | ICD-10-CM | POA: Diagnosis not present

## 2021-06-26 DIAGNOSIS — D539 Nutritional anemia, unspecified: Secondary | ICD-10-CM | POA: Diagnosis not present

## 2021-06-26 DIAGNOSIS — G4733 Obstructive sleep apnea (adult) (pediatric): Secondary | ICD-10-CM | POA: Diagnosis not present

## 2021-06-26 DIAGNOSIS — J449 Chronic obstructive pulmonary disease, unspecified: Secondary | ICD-10-CM | POA: Diagnosis not present

## 2021-06-27 DIAGNOSIS — D539 Nutritional anemia, unspecified: Secondary | ICD-10-CM | POA: Diagnosis not present

## 2021-06-27 DIAGNOSIS — N200 Calculus of kidney: Secondary | ICD-10-CM | POA: Diagnosis not present

## 2021-06-27 DIAGNOSIS — R531 Weakness: Secondary | ICD-10-CM | POA: Diagnosis not present

## 2021-06-27 DIAGNOSIS — J449 Chronic obstructive pulmonary disease, unspecified: Secondary | ICD-10-CM | POA: Diagnosis not present

## 2021-06-27 DIAGNOSIS — G4733 Obstructive sleep apnea (adult) (pediatric): Secondary | ICD-10-CM | POA: Diagnosis not present

## 2021-06-27 DIAGNOSIS — I2782 Chronic pulmonary embolism: Secondary | ICD-10-CM | POA: Diagnosis not present

## 2021-06-27 DIAGNOSIS — S82141D Displaced bicondylar fracture of right tibia, subsequent encounter for closed fracture with routine healing: Secondary | ICD-10-CM | POA: Diagnosis not present

## 2021-06-27 DIAGNOSIS — J9611 Chronic respiratory failure with hypoxia: Secondary | ICD-10-CM | POA: Diagnosis not present

## 2021-06-28 DIAGNOSIS — D539 Nutritional anemia, unspecified: Secondary | ICD-10-CM | POA: Diagnosis not present

## 2021-06-28 DIAGNOSIS — J9611 Chronic respiratory failure with hypoxia: Secondary | ICD-10-CM | POA: Diagnosis not present

## 2021-06-28 DIAGNOSIS — S82141D Displaced bicondylar fracture of right tibia, subsequent encounter for closed fracture with routine healing: Secondary | ICD-10-CM | POA: Diagnosis not present

## 2021-06-28 DIAGNOSIS — G4733 Obstructive sleep apnea (adult) (pediatric): Secondary | ICD-10-CM | POA: Diagnosis not present

## 2021-06-28 DIAGNOSIS — J449 Chronic obstructive pulmonary disease, unspecified: Secondary | ICD-10-CM | POA: Diagnosis not present

## 2021-06-28 DIAGNOSIS — I2782 Chronic pulmonary embolism: Secondary | ICD-10-CM | POA: Diagnosis not present

## 2021-06-28 DIAGNOSIS — N200 Calculus of kidney: Secondary | ICD-10-CM | POA: Diagnosis not present

## 2021-06-28 DIAGNOSIS — R531 Weakness: Secondary | ICD-10-CM | POA: Diagnosis not present

## 2021-06-29 DIAGNOSIS — D539 Nutritional anemia, unspecified: Secondary | ICD-10-CM | POA: Diagnosis not present

## 2021-06-29 DIAGNOSIS — R531 Weakness: Secondary | ICD-10-CM | POA: Diagnosis not present

## 2021-06-29 DIAGNOSIS — J449 Chronic obstructive pulmonary disease, unspecified: Secondary | ICD-10-CM | POA: Diagnosis not present

## 2021-06-29 DIAGNOSIS — I2782 Chronic pulmonary embolism: Secondary | ICD-10-CM | POA: Diagnosis not present

## 2021-06-29 DIAGNOSIS — J9611 Chronic respiratory failure with hypoxia: Secondary | ICD-10-CM | POA: Diagnosis not present

## 2021-06-29 DIAGNOSIS — N200 Calculus of kidney: Secondary | ICD-10-CM | POA: Diagnosis not present

## 2021-06-29 DIAGNOSIS — S82141D Displaced bicondylar fracture of right tibia, subsequent encounter for closed fracture with routine healing: Secondary | ICD-10-CM | POA: Diagnosis not present

## 2021-06-29 DIAGNOSIS — G4733 Obstructive sleep apnea (adult) (pediatric): Secondary | ICD-10-CM | POA: Diagnosis not present

## 2021-06-30 DIAGNOSIS — S82141D Displaced bicondylar fracture of right tibia, subsequent encounter for closed fracture with routine healing: Secondary | ICD-10-CM | POA: Diagnosis not present

## 2021-06-30 DIAGNOSIS — J9611 Chronic respiratory failure with hypoxia: Secondary | ICD-10-CM | POA: Diagnosis not present

## 2021-06-30 DIAGNOSIS — D539 Nutritional anemia, unspecified: Secondary | ICD-10-CM | POA: Diagnosis not present

## 2021-06-30 DIAGNOSIS — R531 Weakness: Secondary | ICD-10-CM | POA: Diagnosis not present

## 2021-06-30 DIAGNOSIS — I2782 Chronic pulmonary embolism: Secondary | ICD-10-CM | POA: Diagnosis not present

## 2021-06-30 DIAGNOSIS — J449 Chronic obstructive pulmonary disease, unspecified: Secondary | ICD-10-CM | POA: Diagnosis not present

## 2021-06-30 DIAGNOSIS — G4733 Obstructive sleep apnea (adult) (pediatric): Secondary | ICD-10-CM | POA: Diagnosis not present

## 2021-06-30 DIAGNOSIS — N200 Calculus of kidney: Secondary | ICD-10-CM | POA: Diagnosis not present

## 2021-07-01 DIAGNOSIS — J9611 Chronic respiratory failure with hypoxia: Secondary | ICD-10-CM | POA: Diagnosis not present

## 2021-07-01 DIAGNOSIS — J449 Chronic obstructive pulmonary disease, unspecified: Secondary | ICD-10-CM | POA: Diagnosis not present

## 2021-07-01 DIAGNOSIS — S82141D Displaced bicondylar fracture of right tibia, subsequent encounter for closed fracture with routine healing: Secondary | ICD-10-CM | POA: Diagnosis not present

## 2021-07-01 DIAGNOSIS — R531 Weakness: Secondary | ICD-10-CM | POA: Diagnosis not present

## 2021-07-01 DIAGNOSIS — G4733 Obstructive sleep apnea (adult) (pediatric): Secondary | ICD-10-CM | POA: Diagnosis not present

## 2021-07-01 DIAGNOSIS — N200 Calculus of kidney: Secondary | ICD-10-CM | POA: Diagnosis not present

## 2021-07-01 DIAGNOSIS — D539 Nutritional anemia, unspecified: Secondary | ICD-10-CM | POA: Diagnosis not present

## 2021-07-01 DIAGNOSIS — I2782 Chronic pulmonary embolism: Secondary | ICD-10-CM | POA: Diagnosis not present

## 2021-07-02 DIAGNOSIS — S82141D Displaced bicondylar fracture of right tibia, subsequent encounter for closed fracture with routine healing: Secondary | ICD-10-CM | POA: Diagnosis not present

## 2021-07-02 DIAGNOSIS — J449 Chronic obstructive pulmonary disease, unspecified: Secondary | ICD-10-CM | POA: Diagnosis not present

## 2021-07-02 DIAGNOSIS — D539 Nutritional anemia, unspecified: Secondary | ICD-10-CM | POA: Diagnosis not present

## 2021-07-02 DIAGNOSIS — N200 Calculus of kidney: Secondary | ICD-10-CM | POA: Diagnosis not present

## 2021-07-02 DIAGNOSIS — J9611 Chronic respiratory failure with hypoxia: Secondary | ICD-10-CM | POA: Diagnosis not present

## 2021-07-02 DIAGNOSIS — I2782 Chronic pulmonary embolism: Secondary | ICD-10-CM | POA: Diagnosis not present

## 2021-07-02 DIAGNOSIS — R531 Weakness: Secondary | ICD-10-CM | POA: Diagnosis not present

## 2021-07-02 DIAGNOSIS — G4733 Obstructive sleep apnea (adult) (pediatric): Secondary | ICD-10-CM | POA: Diagnosis not present

## 2021-07-03 DIAGNOSIS — R531 Weakness: Secondary | ICD-10-CM | POA: Diagnosis not present

## 2021-07-03 DIAGNOSIS — J9611 Chronic respiratory failure with hypoxia: Secondary | ICD-10-CM | POA: Diagnosis not present

## 2021-07-03 DIAGNOSIS — D539 Nutritional anemia, unspecified: Secondary | ICD-10-CM | POA: Diagnosis not present

## 2021-07-03 DIAGNOSIS — N200 Calculus of kidney: Secondary | ICD-10-CM | POA: Diagnosis not present

## 2021-07-03 DIAGNOSIS — I2782 Chronic pulmonary embolism: Secondary | ICD-10-CM | POA: Diagnosis not present

## 2021-07-03 DIAGNOSIS — J449 Chronic obstructive pulmonary disease, unspecified: Secondary | ICD-10-CM | POA: Diagnosis not present

## 2021-07-03 DIAGNOSIS — G4733 Obstructive sleep apnea (adult) (pediatric): Secondary | ICD-10-CM | POA: Diagnosis not present

## 2021-07-03 DIAGNOSIS — S82141D Displaced bicondylar fracture of right tibia, subsequent encounter for closed fracture with routine healing: Secondary | ICD-10-CM | POA: Diagnosis not present

## 2021-07-04 DIAGNOSIS — R531 Weakness: Secondary | ICD-10-CM | POA: Diagnosis not present

## 2021-07-04 DIAGNOSIS — G4733 Obstructive sleep apnea (adult) (pediatric): Secondary | ICD-10-CM | POA: Diagnosis not present

## 2021-07-04 DIAGNOSIS — D539 Nutritional anemia, unspecified: Secondary | ICD-10-CM | POA: Diagnosis not present

## 2021-07-04 DIAGNOSIS — S82141D Displaced bicondylar fracture of right tibia, subsequent encounter for closed fracture with routine healing: Secondary | ICD-10-CM | POA: Diagnosis not present

## 2021-07-04 DIAGNOSIS — I2782 Chronic pulmonary embolism: Secondary | ICD-10-CM | POA: Diagnosis not present

## 2021-07-04 DIAGNOSIS — J9611 Chronic respiratory failure with hypoxia: Secondary | ICD-10-CM | POA: Diagnosis not present

## 2021-07-04 DIAGNOSIS — J449 Chronic obstructive pulmonary disease, unspecified: Secondary | ICD-10-CM | POA: Diagnosis not present

## 2021-07-04 DIAGNOSIS — N200 Calculus of kidney: Secondary | ICD-10-CM | POA: Diagnosis not present

## 2021-07-05 DIAGNOSIS — S82141A Displaced bicondylar fracture of right tibia, initial encounter for closed fracture: Secondary | ICD-10-CM | POA: Diagnosis not present

## 2021-07-05 DIAGNOSIS — D539 Nutritional anemia, unspecified: Secondary | ICD-10-CM | POA: Diagnosis not present

## 2021-07-05 DIAGNOSIS — I10 Essential (primary) hypertension: Secondary | ICD-10-CM | POA: Diagnosis not present

## 2021-07-05 DIAGNOSIS — N3001 Acute cystitis with hematuria: Secondary | ICD-10-CM | POA: Diagnosis not present

## 2021-07-05 DIAGNOSIS — F330 Major depressive disorder, recurrent, mild: Secondary | ICD-10-CM | POA: Diagnosis not present

## 2021-07-05 DIAGNOSIS — R159 Full incontinence of feces: Secondary | ICD-10-CM | POA: Diagnosis not present

## 2021-07-05 DIAGNOSIS — D62 Acute posthemorrhagic anemia: Secondary | ICD-10-CM | POA: Diagnosis not present

## 2021-07-05 DIAGNOSIS — K760 Fatty (change of) liver, not elsewhere classified: Secondary | ICD-10-CM | POA: Diagnosis not present

## 2021-07-05 DIAGNOSIS — N281 Cyst of kidney, acquired: Secondary | ICD-10-CM | POA: Diagnosis not present

## 2021-07-05 DIAGNOSIS — M1000 Idiopathic gout, unspecified site: Secondary | ICD-10-CM | POA: Diagnosis not present

## 2021-07-05 DIAGNOSIS — J9611 Chronic respiratory failure with hypoxia: Secondary | ICD-10-CM | POA: Diagnosis not present

## 2021-07-05 DIAGNOSIS — N823 Fistula of vagina to large intestine: Secondary | ICD-10-CM | POA: Diagnosis not present

## 2021-07-05 DIAGNOSIS — Z743 Need for continuous supervision: Secondary | ICD-10-CM | POA: Diagnosis not present

## 2021-07-05 DIAGNOSIS — M25579 Pain in unspecified ankle and joints of unspecified foot: Secondary | ICD-10-CM | POA: Diagnosis not present

## 2021-07-05 DIAGNOSIS — G4733 Obstructive sleep apnea (adult) (pediatric): Secondary | ICD-10-CM | POA: Diagnosis not present

## 2021-07-05 DIAGNOSIS — J9621 Acute and chronic respiratory failure with hypoxia: Secondary | ICD-10-CM | POA: Diagnosis not present

## 2021-07-05 DIAGNOSIS — R102 Pelvic and perineal pain: Secondary | ICD-10-CM | POA: Diagnosis not present

## 2021-07-05 DIAGNOSIS — S82141S Displaced bicondylar fracture of right tibia, sequela: Secondary | ICD-10-CM | POA: Diagnosis not present

## 2021-07-05 DIAGNOSIS — L02235 Carbuncle of perineum: Secondary | ICD-10-CM | POA: Diagnosis not present

## 2021-07-05 DIAGNOSIS — N200 Calculus of kidney: Secondary | ICD-10-CM | POA: Diagnosis not present

## 2021-07-05 DIAGNOSIS — R197 Diarrhea, unspecified: Secondary | ICD-10-CM | POA: Diagnosis not present

## 2021-07-05 DIAGNOSIS — K529 Noninfective gastroenteritis and colitis, unspecified: Secondary | ICD-10-CM | POA: Diagnosis not present

## 2021-07-05 DIAGNOSIS — R531 Weakness: Secondary | ICD-10-CM | POA: Diagnosis not present

## 2021-07-05 DIAGNOSIS — F411 Generalized anxiety disorder: Secondary | ICD-10-CM | POA: Diagnosis not present

## 2021-07-05 DIAGNOSIS — F419 Anxiety disorder, unspecified: Secondary | ICD-10-CM | POA: Diagnosis not present

## 2021-07-05 DIAGNOSIS — J189 Pneumonia, unspecified organism: Secondary | ICD-10-CM | POA: Diagnosis not present

## 2021-07-05 DIAGNOSIS — N1330 Unspecified hydronephrosis: Secondary | ICD-10-CM | POA: Diagnosis not present

## 2021-07-05 DIAGNOSIS — K51313 Ulcerative (chronic) rectosigmoiditis with fistula: Secondary | ICD-10-CM | POA: Diagnosis not present

## 2021-07-05 DIAGNOSIS — K611 Rectal abscess: Secondary | ICD-10-CM | POA: Diagnosis not present

## 2021-07-05 DIAGNOSIS — N1831 Chronic kidney disease, stage 3a: Secondary | ICD-10-CM | POA: Diagnosis not present

## 2021-07-05 DIAGNOSIS — J432 Centrilobular emphysema: Secondary | ICD-10-CM | POA: Diagnosis not present

## 2021-07-05 DIAGNOSIS — M109 Gout, unspecified: Secondary | ICD-10-CM | POA: Diagnosis not present

## 2021-07-05 DIAGNOSIS — K219 Gastro-esophageal reflux disease without esophagitis: Secondary | ICD-10-CM | POA: Diagnosis not present

## 2021-07-05 DIAGNOSIS — I2699 Other pulmonary embolism without acute cor pulmonale: Secondary | ICD-10-CM | POA: Diagnosis not present

## 2021-07-05 DIAGNOSIS — I2782 Chronic pulmonary embolism: Secondary | ICD-10-CM | POA: Diagnosis not present

## 2021-07-05 DIAGNOSIS — R5381 Other malaise: Secondary | ICD-10-CM | POA: Diagnosis not present

## 2021-07-05 DIAGNOSIS — J449 Chronic obstructive pulmonary disease, unspecified: Secondary | ICD-10-CM | POA: Diagnosis not present

## 2021-07-05 DIAGNOSIS — M79604 Pain in right leg: Secondary | ICD-10-CM | POA: Diagnosis not present

## 2021-07-05 DIAGNOSIS — K603 Anal fistula: Secondary | ICD-10-CM | POA: Diagnosis not present

## 2021-07-05 DIAGNOSIS — S82141D Displaced bicondylar fracture of right tibia, subsequent encounter for closed fracture with routine healing: Secondary | ICD-10-CM | POA: Diagnosis not present

## 2021-07-05 DIAGNOSIS — N3000 Acute cystitis without hematuria: Secondary | ICD-10-CM | POA: Diagnosis not present

## 2021-07-05 DIAGNOSIS — K604 Rectal fistula: Secondary | ICD-10-CM | POA: Diagnosis not present

## 2021-07-05 DIAGNOSIS — S83241S Other tear of medial meniscus, current injury, right knee, sequela: Secondary | ICD-10-CM | POA: Diagnosis not present

## 2021-07-05 DIAGNOSIS — E039 Hypothyroidism, unspecified: Secondary | ICD-10-CM | POA: Diagnosis not present

## 2021-07-05 DIAGNOSIS — E7439 Other disorders of intestinal carbohydrate absorption: Secondary | ICD-10-CM | POA: Diagnosis not present

## 2021-07-05 DIAGNOSIS — F339 Major depressive disorder, recurrent, unspecified: Secondary | ICD-10-CM | POA: Diagnosis not present

## 2021-07-05 DIAGNOSIS — Z8739 Personal history of other diseases of the musculoskeletal system and connective tissue: Secondary | ICD-10-CM | POA: Diagnosis not present

## 2021-07-05 DIAGNOSIS — R110 Nausea: Secondary | ICD-10-CM | POA: Diagnosis not present

## 2021-07-05 DIAGNOSIS — J850 Gangrene and necrosis of lung: Secondary | ICD-10-CM | POA: Diagnosis not present

## 2021-07-07 ENCOUNTER — Telehealth: Payer: Self-pay | Admitting: Pulmonary Disease

## 2021-07-07 DIAGNOSIS — S82141S Displaced bicondylar fracture of right tibia, sequela: Secondary | ICD-10-CM | POA: Diagnosis not present

## 2021-07-07 DIAGNOSIS — S83241S Other tear of medial meniscus, current injury, right knee, sequela: Secondary | ICD-10-CM | POA: Diagnosis not present

## 2021-07-07 DIAGNOSIS — I10 Essential (primary) hypertension: Secondary | ICD-10-CM | POA: Diagnosis not present

## 2021-07-07 DIAGNOSIS — N3000 Acute cystitis without hematuria: Secondary | ICD-10-CM | POA: Diagnosis not present

## 2021-07-07 NOTE — Telephone Encounter (Signed)
[place] and [place] and was transferred to [facility].  I received Crystal's VM and left detailed message that Dr. Vaughan Browner has patient taking azithromycin '250mg'$  daily as prophylactic, with no end date. Call back number left for Crystal to call back, if needed. ?

## 2021-07-07 NOTE — Telephone Encounter (Signed)
[facility] called and is currently treating patient. Wants to know if there was a stop date to azithromycin '250MG'$ . Call back number is [phone]. ?

## 2021-07-08 DIAGNOSIS — L02235 Carbuncle of perineum: Secondary | ICD-10-CM | POA: Diagnosis not present

## 2021-07-09 DIAGNOSIS — M25579 Pain in unspecified ankle and joints of unspecified foot: Secondary | ICD-10-CM | POA: Diagnosis not present

## 2021-07-09 DIAGNOSIS — Z8739 Personal history of other diseases of the musculoskeletal system and connective tissue: Secondary | ICD-10-CM | POA: Diagnosis not present

## 2021-07-11 DIAGNOSIS — M109 Gout, unspecified: Secondary | ICD-10-CM | POA: Diagnosis not present

## 2021-07-14 DIAGNOSIS — K611 Rectal abscess: Secondary | ICD-10-CM | POA: Diagnosis not present

## 2021-07-14 DIAGNOSIS — K604 Rectal fistula: Secondary | ICD-10-CM | POA: Diagnosis not present

## 2021-07-14 DIAGNOSIS — R159 Full incontinence of feces: Secondary | ICD-10-CM | POA: Diagnosis not present

## 2021-07-15 DIAGNOSIS — S82141A Displaced bicondylar fracture of right tibia, initial encounter for closed fracture: Secondary | ICD-10-CM | POA: Diagnosis not present

## 2021-07-15 DIAGNOSIS — N3001 Acute cystitis with hematuria: Secondary | ICD-10-CM | POA: Diagnosis not present

## 2021-07-15 DIAGNOSIS — M1711 Unilateral primary osteoarthritis, right knee: Secondary | ICD-10-CM | POA: Diagnosis not present

## 2021-07-15 DIAGNOSIS — Z8619 Personal history of other infectious and parasitic diseases: Secondary | ICD-10-CM | POA: Diagnosis not present

## 2021-07-15 DIAGNOSIS — J9621 Acute and chronic respiratory failure with hypoxia: Secondary | ICD-10-CM | POA: Diagnosis not present

## 2021-07-15 DIAGNOSIS — R102 Pelvic and perineal pain: Secondary | ICD-10-CM | POA: Diagnosis not present

## 2021-07-15 DIAGNOSIS — N281 Cyst of kidney, acquired: Secondary | ICD-10-CM | POA: Diagnosis not present

## 2021-07-15 DIAGNOSIS — I2510 Atherosclerotic heart disease of native coronary artery without angina pectoris: Secondary | ICD-10-CM | POA: Diagnosis not present

## 2021-07-15 DIAGNOSIS — N1831 Chronic kidney disease, stage 3a: Secondary | ICD-10-CM | POA: Diagnosis not present

## 2021-07-15 DIAGNOSIS — J90 Pleural effusion, not elsewhere classified: Secondary | ICD-10-CM | POA: Diagnosis not present

## 2021-07-15 DIAGNOSIS — K611 Rectal abscess: Secondary | ICD-10-CM | POA: Diagnosis not present

## 2021-07-15 DIAGNOSIS — R531 Weakness: Secondary | ICD-10-CM | POA: Diagnosis not present

## 2021-07-15 DIAGNOSIS — J850 Gangrene and necrosis of lung: Secondary | ICD-10-CM | POA: Diagnosis not present

## 2021-07-15 DIAGNOSIS — J984 Other disorders of lung: Secondary | ICD-10-CM | POA: Diagnosis not present

## 2021-07-15 DIAGNOSIS — K760 Fatty (change of) liver, not elsewhere classified: Secondary | ICD-10-CM | POA: Diagnosis not present

## 2021-07-15 DIAGNOSIS — J432 Centrilobular emphysema: Secondary | ICD-10-CM | POA: Diagnosis not present

## 2021-07-15 DIAGNOSIS — I2699 Other pulmonary embolism without acute cor pulmonale: Secondary | ICD-10-CM | POA: Diagnosis not present

## 2021-07-15 DIAGNOSIS — S63659A Sprain of metacarpophalangeal joint of unspecified finger, initial encounter: Secondary | ICD-10-CM | POA: Diagnosis not present

## 2021-07-15 DIAGNOSIS — J439 Emphysema, unspecified: Secondary | ICD-10-CM | POA: Diagnosis not present

## 2021-07-15 DIAGNOSIS — Z6835 Body mass index (BMI) 35.0-35.9, adult: Secondary | ICD-10-CM | POA: Diagnosis not present

## 2021-07-15 DIAGNOSIS — R110 Nausea: Secondary | ICD-10-CM | POA: Diagnosis not present

## 2021-07-15 DIAGNOSIS — R918 Other nonspecific abnormal finding of lung field: Secondary | ICD-10-CM | POA: Diagnosis not present

## 2021-07-15 DIAGNOSIS — M25561 Pain in right knee: Secondary | ICD-10-CM | POA: Diagnosis not present

## 2021-07-15 DIAGNOSIS — K603 Anal fistula: Secondary | ICD-10-CM | POA: Diagnosis not present

## 2021-07-15 DIAGNOSIS — R279 Unspecified lack of coordination: Secondary | ICD-10-CM | POA: Diagnosis not present

## 2021-07-15 DIAGNOSIS — S82144A Nondisplaced bicondylar fracture of right tibia, initial encounter for closed fracture: Secondary | ICD-10-CM | POA: Diagnosis not present

## 2021-07-15 DIAGNOSIS — J189 Pneumonia, unspecified organism: Secondary | ICD-10-CM | POA: Diagnosis not present

## 2021-07-15 DIAGNOSIS — G4733 Obstructive sleep apnea (adult) (pediatric): Secondary | ICD-10-CM | POA: Diagnosis not present

## 2021-07-15 DIAGNOSIS — N200 Calculus of kidney: Secondary | ICD-10-CM | POA: Diagnosis not present

## 2021-07-15 DIAGNOSIS — K51313 Ulcerative (chronic) rectosigmoiditis with fistula: Secondary | ICD-10-CM | POA: Diagnosis not present

## 2021-07-15 DIAGNOSIS — Z87891 Personal history of nicotine dependence: Secondary | ICD-10-CM | POA: Diagnosis not present

## 2021-07-15 DIAGNOSIS — D62 Acute posthemorrhagic anemia: Secondary | ICD-10-CM | POA: Diagnosis not present

## 2021-07-15 DIAGNOSIS — R197 Diarrhea, unspecified: Secondary | ICD-10-CM | POA: Diagnosis not present

## 2021-07-15 DIAGNOSIS — S82141D Displaced bicondylar fracture of right tibia, subsequent encounter for closed fracture with routine healing: Secondary | ICD-10-CM | POA: Diagnosis not present

## 2021-07-15 DIAGNOSIS — J441 Chronic obstructive pulmonary disease with (acute) exacerbation: Secondary | ICD-10-CM | POA: Diagnosis not present

## 2021-07-15 DIAGNOSIS — I517 Cardiomegaly: Secondary | ICD-10-CM | POA: Diagnosis not present

## 2021-07-15 DIAGNOSIS — J431 Panlobular emphysema: Secondary | ICD-10-CM | POA: Diagnosis not present

## 2021-07-15 DIAGNOSIS — M11262 Other chondrocalcinosis, left knee: Secondary | ICD-10-CM | POA: Diagnosis not present

## 2021-07-15 DIAGNOSIS — I959 Hypotension, unspecified: Secondary | ICD-10-CM | POA: Diagnosis not present

## 2021-07-15 DIAGNOSIS — K529 Noninfective gastroenteritis and colitis, unspecified: Secondary | ICD-10-CM | POA: Diagnosis not present

## 2021-07-15 DIAGNOSIS — F339 Major depressive disorder, recurrent, unspecified: Secondary | ICD-10-CM | POA: Diagnosis not present

## 2021-07-15 DIAGNOSIS — L02235 Carbuncle of perineum: Secondary | ICD-10-CM | POA: Diagnosis not present

## 2021-07-15 DIAGNOSIS — G4730 Sleep apnea, unspecified: Secondary | ICD-10-CM | POA: Diagnosis not present

## 2021-07-15 DIAGNOSIS — M109 Gout, unspecified: Secondary | ICD-10-CM | POA: Diagnosis not present

## 2021-07-15 DIAGNOSIS — J449 Chronic obstructive pulmonary disease, unspecified: Secondary | ICD-10-CM | POA: Diagnosis not present

## 2021-07-15 DIAGNOSIS — Z6841 Body Mass Index (BMI) 40.0 and over, adult: Secondary | ICD-10-CM | POA: Diagnosis not present

## 2021-07-15 DIAGNOSIS — J438 Other emphysema: Secondary | ICD-10-CM | POA: Diagnosis not present

## 2021-07-15 DIAGNOSIS — R042 Hemoptysis: Secondary | ICD-10-CM | POA: Diagnosis not present

## 2021-07-15 DIAGNOSIS — Z515 Encounter for palliative care: Secondary | ICD-10-CM | POA: Diagnosis not present

## 2021-07-15 DIAGNOSIS — K604 Rectal fistula: Secondary | ICD-10-CM | POA: Diagnosis not present

## 2021-07-15 DIAGNOSIS — F411 Generalized anxiety disorder: Secondary | ICD-10-CM | POA: Diagnosis not present

## 2021-07-15 DIAGNOSIS — N1330 Unspecified hydronephrosis: Secondary | ICD-10-CM | POA: Diagnosis not present

## 2021-07-15 DIAGNOSIS — F419 Anxiety disorder, unspecified: Secondary | ICD-10-CM | POA: Diagnosis not present

## 2021-07-15 DIAGNOSIS — F330 Major depressive disorder, recurrent, mild: Secondary | ICD-10-CM | POA: Diagnosis not present

## 2021-07-15 DIAGNOSIS — R0602 Shortness of breath: Secondary | ICD-10-CM | POA: Diagnosis not present

## 2021-07-15 DIAGNOSIS — N183 Chronic kidney disease, stage 3 unspecified: Secondary | ICD-10-CM | POA: Diagnosis not present

## 2021-07-15 DIAGNOSIS — N189 Chronic kidney disease, unspecified: Secondary | ICD-10-CM | POA: Diagnosis not present

## 2021-07-15 DIAGNOSIS — Z79899 Other long term (current) drug therapy: Secondary | ICD-10-CM | POA: Diagnosis not present

## 2021-07-15 DIAGNOSIS — M1712 Unilateral primary osteoarthritis, left knee: Secondary | ICD-10-CM | POA: Diagnosis not present

## 2021-07-15 DIAGNOSIS — Z743 Need for continuous supervision: Secondary | ICD-10-CM | POA: Diagnosis not present

## 2021-07-15 DIAGNOSIS — K219 Gastro-esophageal reflux disease without esophagitis: Secondary | ICD-10-CM | POA: Diagnosis not present

## 2021-07-15 DIAGNOSIS — N3000 Acute cystitis without hematuria: Secondary | ICD-10-CM | POA: Diagnosis not present

## 2021-07-15 DIAGNOSIS — E669 Obesity, unspecified: Secondary | ICD-10-CM | POA: Diagnosis not present

## 2021-07-15 DIAGNOSIS — R0902 Hypoxemia: Secondary | ICD-10-CM | POA: Diagnosis not present

## 2021-07-15 DIAGNOSIS — K6139 Other ischiorectal abscess: Secondary | ICD-10-CM | POA: Diagnosis not present

## 2021-07-15 DIAGNOSIS — M7121 Synovial cyst of popliteal space [Baker], right knee: Secondary | ICD-10-CM | POA: Diagnosis not present

## 2021-07-15 DIAGNOSIS — E039 Hypothyroidism, unspecified: Secondary | ICD-10-CM | POA: Diagnosis not present

## 2021-07-15 DIAGNOSIS — J9611 Chronic respiratory failure with hypoxia: Secondary | ICD-10-CM | POA: Diagnosis not present

## 2021-07-15 DIAGNOSIS — N823 Fistula of vagina to large intestine: Secondary | ICD-10-CM | POA: Diagnosis not present

## 2021-07-19 DIAGNOSIS — K603 Anal fistula: Secondary | ICD-10-CM | POA: Diagnosis not present

## 2021-07-23 DIAGNOSIS — J90 Pleural effusion, not elsewhere classified: Secondary | ICD-10-CM | POA: Diagnosis not present

## 2021-07-23 DIAGNOSIS — J439 Emphysema, unspecified: Secondary | ICD-10-CM | POA: Diagnosis not present

## 2021-07-23 DIAGNOSIS — N1330 Unspecified hydronephrosis: Secondary | ICD-10-CM | POA: Diagnosis not present

## 2021-07-23 DIAGNOSIS — R918 Other nonspecific abnormal finding of lung field: Secondary | ICD-10-CM | POA: Diagnosis not present

## 2021-07-23 DIAGNOSIS — M1711 Unilateral primary osteoarthritis, right knee: Secondary | ICD-10-CM | POA: Diagnosis not present

## 2021-07-23 DIAGNOSIS — N281 Cyst of kidney, acquired: Secondary | ICD-10-CM | POA: Diagnosis not present

## 2021-07-23 DIAGNOSIS — N200 Calculus of kidney: Secondary | ICD-10-CM | POA: Diagnosis not present

## 2021-07-23 DIAGNOSIS — K760 Fatty (change of) liver, not elsewhere classified: Secondary | ICD-10-CM | POA: Diagnosis not present

## 2021-07-23 DIAGNOSIS — I517 Cardiomegaly: Secondary | ICD-10-CM | POA: Diagnosis not present

## 2021-07-24 DIAGNOSIS — J850 Gangrene and necrosis of lung: Secondary | ICD-10-CM | POA: Diagnosis not present

## 2021-07-24 DIAGNOSIS — J432 Centrilobular emphysema: Secondary | ICD-10-CM | POA: Diagnosis not present

## 2021-07-24 DIAGNOSIS — J449 Chronic obstructive pulmonary disease, unspecified: Secondary | ICD-10-CM | POA: Diagnosis not present

## 2021-07-24 DIAGNOSIS — R042 Hemoptysis: Secondary | ICD-10-CM | POA: Diagnosis not present

## 2021-07-24 DIAGNOSIS — I2699 Other pulmonary embolism without acute cor pulmonale: Secondary | ICD-10-CM | POA: Diagnosis not present

## 2021-07-24 DIAGNOSIS — J9621 Acute and chronic respiratory failure with hypoxia: Secondary | ICD-10-CM | POA: Diagnosis not present

## 2021-07-24 DIAGNOSIS — N823 Fistula of vagina to large intestine: Secondary | ICD-10-CM | POA: Diagnosis not present

## 2021-07-24 DIAGNOSIS — S82144A Nondisplaced bicondylar fracture of right tibia, initial encounter for closed fracture: Secondary | ICD-10-CM | POA: Diagnosis not present

## 2021-07-24 DIAGNOSIS — J441 Chronic obstructive pulmonary disease with (acute) exacerbation: Secondary | ICD-10-CM | POA: Diagnosis not present

## 2021-07-25 DIAGNOSIS — J850 Gangrene and necrosis of lung: Secondary | ICD-10-CM | POA: Diagnosis not present

## 2021-07-25 DIAGNOSIS — J9621 Acute and chronic respiratory failure with hypoxia: Secondary | ICD-10-CM | POA: Diagnosis not present

## 2021-07-25 DIAGNOSIS — R042 Hemoptysis: Secondary | ICD-10-CM | POA: Diagnosis not present

## 2021-07-25 DIAGNOSIS — I2699 Other pulmonary embolism without acute cor pulmonale: Secondary | ICD-10-CM | POA: Diagnosis not present

## 2021-07-25 DIAGNOSIS — J432 Centrilobular emphysema: Secondary | ICD-10-CM | POA: Diagnosis not present

## 2021-07-25 DIAGNOSIS — J449 Chronic obstructive pulmonary disease, unspecified: Secondary | ICD-10-CM | POA: Diagnosis not present

## 2021-07-25 DIAGNOSIS — N823 Fistula of vagina to large intestine: Secondary | ICD-10-CM | POA: Diagnosis not present

## 2021-07-25 DIAGNOSIS — J441 Chronic obstructive pulmonary disease with (acute) exacerbation: Secondary | ICD-10-CM | POA: Diagnosis not present

## 2021-07-26 DIAGNOSIS — J850 Gangrene and necrosis of lung: Secondary | ICD-10-CM | POA: Diagnosis not present

## 2021-07-26 DIAGNOSIS — J449 Chronic obstructive pulmonary disease, unspecified: Secondary | ICD-10-CM | POA: Diagnosis not present

## 2021-07-26 DIAGNOSIS — J9621 Acute and chronic respiratory failure with hypoxia: Secondary | ICD-10-CM | POA: Diagnosis not present

## 2021-07-26 DIAGNOSIS — N823 Fistula of vagina to large intestine: Secondary | ICD-10-CM | POA: Diagnosis not present

## 2021-07-26 DIAGNOSIS — R042 Hemoptysis: Secondary | ICD-10-CM | POA: Diagnosis not present

## 2021-07-26 DIAGNOSIS — J432 Centrilobular emphysema: Secondary | ICD-10-CM | POA: Diagnosis not present

## 2021-07-26 DIAGNOSIS — I2699 Other pulmonary embolism without acute cor pulmonale: Secondary | ICD-10-CM | POA: Diagnosis not present

## 2021-07-26 DIAGNOSIS — J441 Chronic obstructive pulmonary disease with (acute) exacerbation: Secondary | ICD-10-CM | POA: Diagnosis not present

## 2021-07-27 DIAGNOSIS — J449 Chronic obstructive pulmonary disease, unspecified: Secondary | ICD-10-CM | POA: Diagnosis not present

## 2021-07-27 DIAGNOSIS — J9621 Acute and chronic respiratory failure with hypoxia: Secondary | ICD-10-CM | POA: Diagnosis not present

## 2021-07-27 DIAGNOSIS — R042 Hemoptysis: Secondary | ICD-10-CM | POA: Diagnosis not present

## 2021-07-27 DIAGNOSIS — M7121 Synovial cyst of popliteal space [Baker], right knee: Secondary | ICD-10-CM | POA: Diagnosis not present

## 2021-07-27 DIAGNOSIS — N823 Fistula of vagina to large intestine: Secondary | ICD-10-CM | POA: Diagnosis not present

## 2021-07-27 DIAGNOSIS — J850 Gangrene and necrosis of lung: Secondary | ICD-10-CM | POA: Diagnosis not present

## 2021-07-27 DIAGNOSIS — J441 Chronic obstructive pulmonary disease with (acute) exacerbation: Secondary | ICD-10-CM | POA: Diagnosis not present

## 2021-07-27 DIAGNOSIS — J432 Centrilobular emphysema: Secondary | ICD-10-CM | POA: Diagnosis not present

## 2021-07-27 DIAGNOSIS — I2699 Other pulmonary embolism without acute cor pulmonale: Secondary | ICD-10-CM | POA: Diagnosis not present

## 2021-07-28 DIAGNOSIS — N823 Fistula of vagina to large intestine: Secondary | ICD-10-CM | POA: Diagnosis not present

## 2021-07-28 DIAGNOSIS — J850 Gangrene and necrosis of lung: Secondary | ICD-10-CM | POA: Diagnosis not present

## 2021-07-28 DIAGNOSIS — J9621 Acute and chronic respiratory failure with hypoxia: Secondary | ICD-10-CM | POA: Diagnosis not present

## 2021-07-28 DIAGNOSIS — J441 Chronic obstructive pulmonary disease with (acute) exacerbation: Secondary | ICD-10-CM | POA: Diagnosis not present

## 2021-07-28 DIAGNOSIS — R042 Hemoptysis: Secondary | ICD-10-CM | POA: Diagnosis not present

## 2021-07-28 DIAGNOSIS — I2699 Other pulmonary embolism without acute cor pulmonale: Secondary | ICD-10-CM | POA: Diagnosis not present

## 2021-07-28 DIAGNOSIS — J449 Chronic obstructive pulmonary disease, unspecified: Secondary | ICD-10-CM | POA: Diagnosis not present

## 2021-07-28 DIAGNOSIS — J432 Centrilobular emphysema: Secondary | ICD-10-CM | POA: Diagnosis not present

## 2021-07-29 DIAGNOSIS — J441 Chronic obstructive pulmonary disease with (acute) exacerbation: Secondary | ICD-10-CM | POA: Diagnosis not present

## 2021-07-29 DIAGNOSIS — I2699 Other pulmonary embolism without acute cor pulmonale: Secondary | ICD-10-CM | POA: Diagnosis not present

## 2021-07-29 DIAGNOSIS — J432 Centrilobular emphysema: Secondary | ICD-10-CM | POA: Diagnosis not present

## 2021-07-29 DIAGNOSIS — J449 Chronic obstructive pulmonary disease, unspecified: Secondary | ICD-10-CM | POA: Diagnosis not present

## 2021-07-29 DIAGNOSIS — R042 Hemoptysis: Secondary | ICD-10-CM | POA: Diagnosis not present

## 2021-07-29 DIAGNOSIS — J850 Gangrene and necrosis of lung: Secondary | ICD-10-CM | POA: Diagnosis not present

## 2021-07-29 DIAGNOSIS — N823 Fistula of vagina to large intestine: Secondary | ICD-10-CM | POA: Diagnosis not present

## 2021-07-29 DIAGNOSIS — J9621 Acute and chronic respiratory failure with hypoxia: Secondary | ICD-10-CM | POA: Diagnosis not present

## 2021-07-30 DIAGNOSIS — N823 Fistula of vagina to large intestine: Secondary | ICD-10-CM | POA: Diagnosis not present

## 2021-07-30 DIAGNOSIS — J441 Chronic obstructive pulmonary disease with (acute) exacerbation: Secondary | ICD-10-CM | POA: Diagnosis not present

## 2021-07-30 DIAGNOSIS — J449 Chronic obstructive pulmonary disease, unspecified: Secondary | ICD-10-CM | POA: Diagnosis not present

## 2021-07-30 DIAGNOSIS — J432 Centrilobular emphysema: Secondary | ICD-10-CM | POA: Diagnosis not present

## 2021-07-30 DIAGNOSIS — R042 Hemoptysis: Secondary | ICD-10-CM | POA: Diagnosis not present

## 2021-07-30 DIAGNOSIS — J9621 Acute and chronic respiratory failure with hypoxia: Secondary | ICD-10-CM | POA: Diagnosis not present

## 2021-07-30 DIAGNOSIS — J850 Gangrene and necrosis of lung: Secondary | ICD-10-CM | POA: Diagnosis not present

## 2021-07-30 DIAGNOSIS — I2699 Other pulmonary embolism without acute cor pulmonale: Secondary | ICD-10-CM | POA: Diagnosis not present

## 2021-08-04 DIAGNOSIS — G4733 Obstructive sleep apnea (adult) (pediatric): Secondary | ICD-10-CM | POA: Diagnosis not present

## 2021-08-04 DIAGNOSIS — J449 Chronic obstructive pulmonary disease, unspecified: Secondary | ICD-10-CM | POA: Diagnosis not present

## 2021-08-04 DIAGNOSIS — J850 Gangrene and necrosis of lung: Secondary | ICD-10-CM | POA: Diagnosis not present

## 2021-08-04 DIAGNOSIS — J9621 Acute and chronic respiratory failure with hypoxia: Secondary | ICD-10-CM | POA: Diagnosis not present

## 2021-08-04 DIAGNOSIS — N823 Fistula of vagina to large intestine: Secondary | ICD-10-CM | POA: Diagnosis not present

## 2021-08-04 DIAGNOSIS — Z6841 Body Mass Index (BMI) 40.0 and over, adult: Secondary | ICD-10-CM | POA: Diagnosis not present

## 2021-08-04 DIAGNOSIS — R042 Hemoptysis: Secondary | ICD-10-CM | POA: Diagnosis not present

## 2021-08-11 DIAGNOSIS — G4733 Obstructive sleep apnea (adult) (pediatric): Secondary | ICD-10-CM | POA: Diagnosis not present

## 2021-08-11 DIAGNOSIS — J9621 Acute and chronic respiratory failure with hypoxia: Secondary | ICD-10-CM | POA: Diagnosis not present

## 2021-08-11 DIAGNOSIS — R042 Hemoptysis: Secondary | ICD-10-CM | POA: Diagnosis not present

## 2021-08-11 DIAGNOSIS — J449 Chronic obstructive pulmonary disease, unspecified: Secondary | ICD-10-CM | POA: Diagnosis not present

## 2021-08-11 DIAGNOSIS — Z6841 Body Mass Index (BMI) 40.0 and over, adult: Secondary | ICD-10-CM | POA: Diagnosis not present

## 2021-08-11 DIAGNOSIS — N823 Fistula of vagina to large intestine: Secondary | ICD-10-CM | POA: Diagnosis not present

## 2021-08-11 DIAGNOSIS — J850 Gangrene and necrosis of lung: Secondary | ICD-10-CM | POA: Diagnosis not present

## 2021-08-20 DIAGNOSIS — R0902 Hypoxemia: Secondary | ICD-10-CM | POA: Diagnosis not present

## 2021-08-20 DIAGNOSIS — M109 Gout, unspecified: Secondary | ICD-10-CM | POA: Diagnosis not present

## 2021-08-20 DIAGNOSIS — R279 Unspecified lack of coordination: Secondary | ICD-10-CM | POA: Diagnosis not present

## 2021-08-20 DIAGNOSIS — R627 Adult failure to thrive: Secondary | ICD-10-CM | POA: Diagnosis not present

## 2021-08-20 DIAGNOSIS — K529 Noninfective gastroenteritis and colitis, unspecified: Secondary | ICD-10-CM | POA: Diagnosis not present

## 2021-08-20 DIAGNOSIS — Z743 Need for continuous supervision: Secondary | ICD-10-CM | POA: Diagnosis not present

## 2021-08-20 DIAGNOSIS — F5102 Adjustment insomnia: Secondary | ICD-10-CM | POA: Diagnosis not present

## 2021-08-20 DIAGNOSIS — Z515 Encounter for palliative care: Secondary | ICD-10-CM | POA: Diagnosis not present

## 2021-08-20 DIAGNOSIS — K602 Anal fissure, unspecified: Secondary | ICD-10-CM | POA: Diagnosis not present

## 2021-08-20 DIAGNOSIS — N3000 Acute cystitis without hematuria: Secondary | ICD-10-CM | POA: Diagnosis not present

## 2021-08-20 DIAGNOSIS — Z87891 Personal history of nicotine dependence: Secondary | ICD-10-CM | POA: Diagnosis not present

## 2021-08-20 DIAGNOSIS — F331 Major depressive disorder, recurrent, moderate: Secondary | ICD-10-CM | POA: Diagnosis not present

## 2021-08-20 DIAGNOSIS — J9621 Acute and chronic respiratory failure with hypoxia: Secondary | ICD-10-CM | POA: Diagnosis not present

## 2021-08-20 DIAGNOSIS — R195 Other fecal abnormalities: Secondary | ICD-10-CM | POA: Diagnosis not present

## 2021-08-20 DIAGNOSIS — Z9981 Dependence on supplemental oxygen: Secondary | ICD-10-CM | POA: Diagnosis not present

## 2021-08-20 DIAGNOSIS — D62 Acute posthemorrhagic anemia: Secondary | ICD-10-CM | POA: Diagnosis not present

## 2021-08-20 DIAGNOSIS — J189 Pneumonia, unspecified organism: Secondary | ICD-10-CM | POA: Diagnosis not present

## 2021-08-20 DIAGNOSIS — Z09 Encounter for follow-up examination after completed treatment for conditions other than malignant neoplasm: Secondary | ICD-10-CM | POA: Diagnosis not present

## 2021-08-20 DIAGNOSIS — N183 Chronic kidney disease, stage 3 unspecified: Secondary | ICD-10-CM | POA: Diagnosis not present

## 2021-08-20 DIAGNOSIS — I10 Essential (primary) hypertension: Secondary | ICD-10-CM | POA: Diagnosis not present

## 2021-08-20 DIAGNOSIS — J449 Chronic obstructive pulmonary disease, unspecified: Secondary | ICD-10-CM | POA: Diagnosis not present

## 2021-08-20 DIAGNOSIS — D539 Nutritional anemia, unspecified: Secondary | ICD-10-CM | POA: Diagnosis not present

## 2021-08-20 DIAGNOSIS — R630 Anorexia: Secondary | ICD-10-CM | POA: Diagnosis not present

## 2021-08-20 DIAGNOSIS — I959 Hypotension, unspecified: Secondary | ICD-10-CM | POA: Diagnosis not present

## 2021-08-20 DIAGNOSIS — K603 Anal fistula: Secondary | ICD-10-CM | POA: Diagnosis not present

## 2021-08-20 DIAGNOSIS — R110 Nausea: Secondary | ICD-10-CM | POA: Diagnosis not present

## 2021-08-20 DIAGNOSIS — N179 Acute kidney failure, unspecified: Secondary | ICD-10-CM | POA: Diagnosis not present

## 2021-08-20 DIAGNOSIS — J850 Gangrene and necrosis of lung: Secondary | ICD-10-CM | POA: Diagnosis not present

## 2021-08-20 DIAGNOSIS — J9611 Chronic respiratory failure with hypoxia: Secondary | ICD-10-CM | POA: Diagnosis not present

## 2021-08-20 DIAGNOSIS — Z86711 Personal history of pulmonary embolism: Secondary | ICD-10-CM | POA: Diagnosis not present

## 2021-08-20 DIAGNOSIS — I2699 Other pulmonary embolism without acute cor pulmonale: Secondary | ICD-10-CM | POA: Diagnosis not present

## 2021-08-20 DIAGNOSIS — D72829 Elevated white blood cell count, unspecified: Secondary | ICD-10-CM | POA: Diagnosis not present

## 2021-08-20 DIAGNOSIS — L02235 Carbuncle of perineum: Secondary | ICD-10-CM | POA: Diagnosis not present

## 2021-08-20 DIAGNOSIS — F411 Generalized anxiety disorder: Secondary | ICD-10-CM | POA: Diagnosis not present

## 2021-08-21 DIAGNOSIS — I10 Essential (primary) hypertension: Secondary | ICD-10-CM | POA: Diagnosis not present

## 2021-08-22 DIAGNOSIS — K602 Anal fissure, unspecified: Secondary | ICD-10-CM | POA: Diagnosis not present

## 2021-08-22 DIAGNOSIS — J850 Gangrene and necrosis of lung: Secondary | ICD-10-CM | POA: Diagnosis not present

## 2021-08-22 DIAGNOSIS — K603 Anal fistula: Secondary | ICD-10-CM | POA: Diagnosis not present

## 2021-08-22 DIAGNOSIS — J449 Chronic obstructive pulmonary disease, unspecified: Secondary | ICD-10-CM | POA: Diagnosis not present

## 2021-08-26 DIAGNOSIS — F5102 Adjustment insomnia: Secondary | ICD-10-CM | POA: Diagnosis not present

## 2021-08-26 DIAGNOSIS — M109 Gout, unspecified: Secondary | ICD-10-CM | POA: Diagnosis not present

## 2021-08-26 DIAGNOSIS — F331 Major depressive disorder, recurrent, moderate: Secondary | ICD-10-CM | POA: Diagnosis not present

## 2021-08-26 DIAGNOSIS — F411 Generalized anxiety disorder: Secondary | ICD-10-CM | POA: Diagnosis not present

## 2021-08-27 DIAGNOSIS — M109 Gout, unspecified: Secondary | ICD-10-CM | POA: Diagnosis not present

## 2021-09-02 DIAGNOSIS — L02235 Carbuncle of perineum: Secondary | ICD-10-CM | POA: Diagnosis not present

## 2021-09-03 DIAGNOSIS — M109 Gout, unspecified: Secondary | ICD-10-CM | POA: Diagnosis not present

## 2021-09-03 DIAGNOSIS — R195 Other fecal abnormalities: Secondary | ICD-10-CM | POA: Diagnosis not present

## 2021-09-09 DIAGNOSIS — F411 Generalized anxiety disorder: Secondary | ICD-10-CM | POA: Diagnosis not present

## 2021-09-09 DIAGNOSIS — F331 Major depressive disorder, recurrent, moderate: Secondary | ICD-10-CM | POA: Diagnosis not present

## 2021-09-09 DIAGNOSIS — L02235 Carbuncle of perineum: Secondary | ICD-10-CM | POA: Diagnosis not present

## 2021-09-09 DIAGNOSIS — F5102 Adjustment insomnia: Secondary | ICD-10-CM | POA: Diagnosis not present

## 2021-09-10 DIAGNOSIS — R630 Anorexia: Secondary | ICD-10-CM | POA: Diagnosis not present

## 2021-09-10 DIAGNOSIS — R110 Nausea: Secondary | ICD-10-CM | POA: Diagnosis not present

## 2021-09-10 DIAGNOSIS — J850 Gangrene and necrosis of lung: Secondary | ICD-10-CM | POA: Diagnosis not present

## 2021-09-11 DIAGNOSIS — J850 Gangrene and necrosis of lung: Secondary | ICD-10-CM | POA: Diagnosis not present

## 2021-09-11 DIAGNOSIS — Z09 Encounter for follow-up examination after completed treatment for conditions other than malignant neoplasm: Secondary | ICD-10-CM | POA: Diagnosis not present

## 2021-09-11 DIAGNOSIS — D539 Nutritional anemia, unspecified: Secondary | ICD-10-CM | POA: Diagnosis not present

## 2021-09-11 DIAGNOSIS — J9611 Chronic respiratory failure with hypoxia: Secondary | ICD-10-CM | POA: Diagnosis not present

## 2021-09-11 DIAGNOSIS — J449 Chronic obstructive pulmonary disease, unspecified: Secondary | ICD-10-CM | POA: Diagnosis not present

## 2021-09-11 DIAGNOSIS — K603 Anal fistula: Secondary | ICD-10-CM | POA: Diagnosis not present

## 2021-09-11 DIAGNOSIS — Z87891 Personal history of nicotine dependence: Secondary | ICD-10-CM | POA: Diagnosis not present

## 2021-09-11 DIAGNOSIS — Z86711 Personal history of pulmonary embolism: Secondary | ICD-10-CM | POA: Diagnosis not present

## 2021-09-11 DIAGNOSIS — M109 Gout, unspecified: Secondary | ICD-10-CM | POA: Diagnosis not present

## 2021-09-11 DIAGNOSIS — Z515 Encounter for palliative care: Secondary | ICD-10-CM | POA: Diagnosis not present

## 2021-09-11 DIAGNOSIS — Z9981 Dependence on supplemental oxygen: Secondary | ICD-10-CM | POA: Diagnosis not present

## 2021-09-16 DIAGNOSIS — L02235 Carbuncle of perineum: Secondary | ICD-10-CM | POA: Diagnosis not present

## 2021-09-23 DIAGNOSIS — L02235 Carbuncle of perineum: Secondary | ICD-10-CM | POA: Diagnosis not present

## 2021-09-25 DIAGNOSIS — R195 Other fecal abnormalities: Secondary | ICD-10-CM | POA: Diagnosis not present

## 2021-09-25 DIAGNOSIS — R110 Nausea: Secondary | ICD-10-CM | POA: Diagnosis not present

## 2021-09-25 DIAGNOSIS — R630 Anorexia: Secondary | ICD-10-CM | POA: Diagnosis not present

## 2021-09-29 DIAGNOSIS — N179 Acute kidney failure, unspecified: Secondary | ICD-10-CM | POA: Diagnosis not present

## 2021-09-29 DIAGNOSIS — J850 Gangrene and necrosis of lung: Secondary | ICD-10-CM | POA: Diagnosis not present

## 2021-09-29 DIAGNOSIS — R627 Adult failure to thrive: Secondary | ICD-10-CM | POA: Diagnosis not present

## 2021-09-29 DIAGNOSIS — D72829 Elevated white blood cell count, unspecified: Secondary | ICD-10-CM | POA: Diagnosis not present

## 2021-09-30 DIAGNOSIS — L02235 Carbuncle of perineum: Secondary | ICD-10-CM | POA: Diagnosis not present

## 2021-10-01 DIAGNOSIS — R630 Anorexia: Secondary | ICD-10-CM | POA: Diagnosis not present

## 2021-10-01 DIAGNOSIS — N179 Acute kidney failure, unspecified: Secondary | ICD-10-CM | POA: Diagnosis not present

## 2021-10-01 DIAGNOSIS — J850 Gangrene and necrosis of lung: Secondary | ICD-10-CM | POA: Diagnosis not present

## 2021-10-01 DIAGNOSIS — J449 Chronic obstructive pulmonary disease, unspecified: Secondary | ICD-10-CM | POA: Diagnosis not present

## 2021-10-01 DIAGNOSIS — R627 Adult failure to thrive: Secondary | ICD-10-CM | POA: Diagnosis not present

## 2021-10-08 ENCOUNTER — Encounter (INDEPENDENT_AMBULATORY_CARE_PROVIDER_SITE_OTHER): Payer: Self-pay

## 2021-10-31 DEATH — deceased

## 2021-12-29 IMAGING — DX DG CHEST 2V
3 series · 3 of 3 positions shown · non-contrast
Comparison: 03/29/2019, 04/16/2018

CLINICAL DATA: Dyspnea.

EXAM:
CHEST - 2 VIEW

[chest pa]
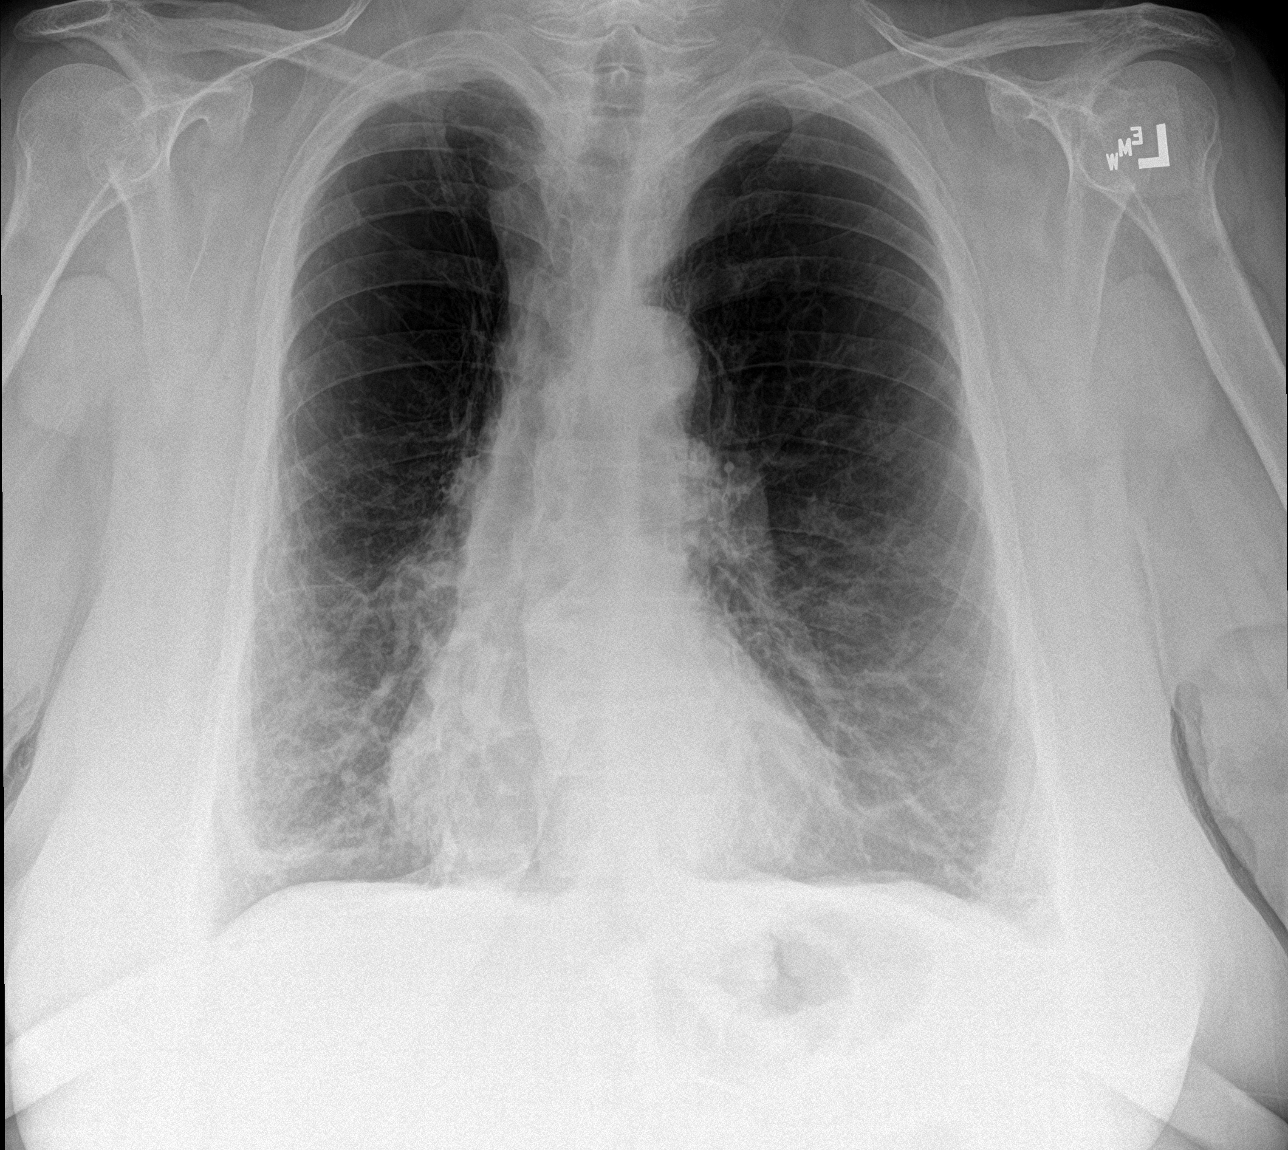

[chest lat (1 of 2)]
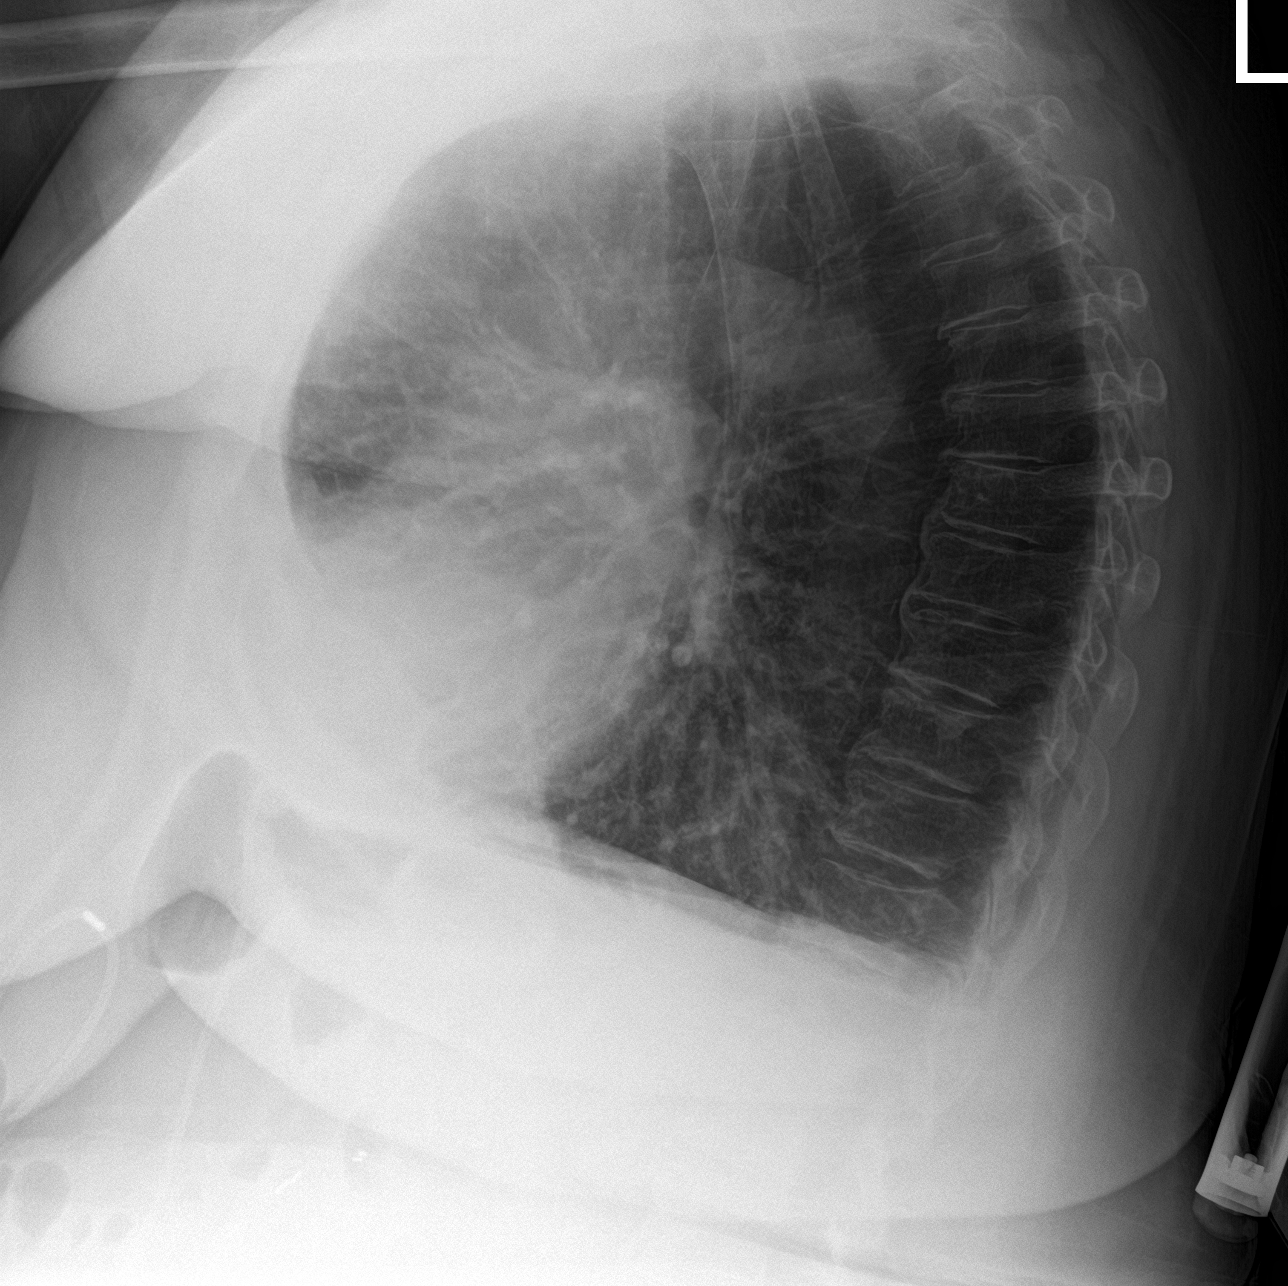

[chest lat (2 of 2)]
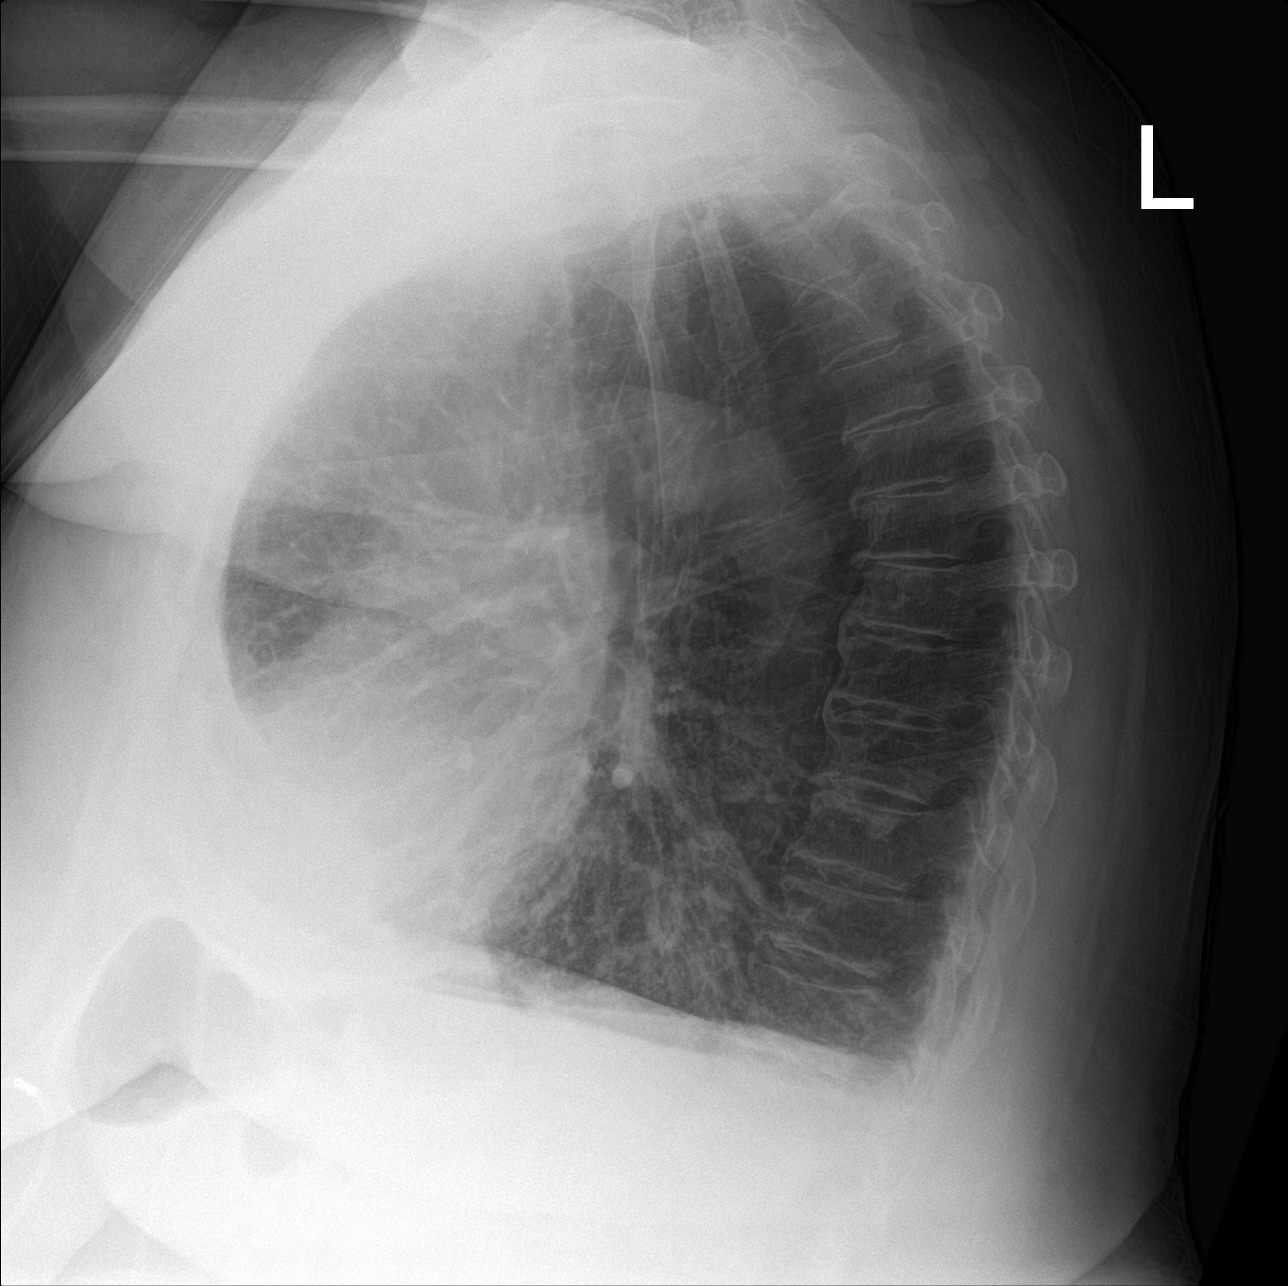

[3 of 3 positions shown; findings below may reference images not displayed]

FINDINGS: Chronic emphysema with hyperinflation. Basilar bronchovascular
crowding likely related to emphysema. There is slight rightward
mediastinal deviation which is been present on prior exams. Suspect
mild scarring in the right mid lung and lingula. Normal heart size.
Unchanged mediastinal contours. No pulmonary edema, pleural
effusion, or evidence of pneumothorax. No confluent airspace
disease. No evident pulmonary mass on radiograph. No acute osseous
abnormalities are seen.
IMPRESSION: 1. Chronic emphysema and hyperinflation. Crowding of bronchovascular
structures at the lung bases likely related to emphysema. No
superimposed acute abnormality.
2. Slight rightward mediastinal deviation is unchanged from prior
exams.

## 2022-02-03 ENCOUNTER — Telehealth: Payer: Self-pay | Admitting: *Deleted

## 2022-02-03 NOTE — Patient Outreach (Signed)
  Care Coordination   02/03/2022 Name: Selby Slovacek MRN: [id number] DOB: [birth year]   Care Coordination Outreach Attempts:  An unsuccessful telephone outreach was attempted today to offer the patient information about available care coordination services as a benefit of their health plan.   Follow Up Plan:  Additional outreach attempts will be made to offer the patient care coordination information and services.   Encounter Outcome:  No Answer   Care Coordination Interventions:  No, not indicated    Raina Mina, RN Care Management Coordinator [place] Office [phone]

## 2022-03-24 NOTE — Progress Notes (Signed)
DME 

## 2022-12-04 ENCOUNTER — Other Ambulatory Visit: Payer: Self-pay | Admitting: Internal Medicine

## 2022-12-04 DIAGNOSIS — Z1211 Encounter for screening for malignant neoplasm of colon: Secondary | ICD-10-CM

## 2022-12-04 DIAGNOSIS — Z1212 Encounter for screening for malignant neoplasm of rectum: Secondary | ICD-10-CM

## 2023-10-08 NOTE — Progress Notes (Signed)
 This encounter was created in error - please disregard.
